# Patient Record
Sex: Female | Born: 1970
Health system: Southern US, Community
[De-identification: ages and names within clinical notes are randomized; demographics above are authoritative.]

## PROBLEM LIST (undated history)

## (undated) DIAGNOSIS — Z8619 Personal history of other infectious and parasitic diseases: Secondary | ICD-10-CM

## (undated) DIAGNOSIS — Z8719 Personal history of other diseases of the digestive system: Secondary | ICD-10-CM

## (undated) DIAGNOSIS — C679 Malignant neoplasm of bladder, unspecified: Secondary | ICD-10-CM

## (undated) DIAGNOSIS — M1990 Unspecified osteoarthritis, unspecified site: Secondary | ICD-10-CM

## (undated) DIAGNOSIS — C539 Malignant neoplasm of cervix uteri, unspecified: Secondary | ICD-10-CM

## (undated) DIAGNOSIS — K629 Disease of anus and rectum, unspecified: Secondary | ICD-10-CM

## (undated) DIAGNOSIS — Z8709 Personal history of other diseases of the respiratory system: Secondary | ICD-10-CM

## (undated) DIAGNOSIS — Z87898 Personal history of other specified conditions: Secondary | ICD-10-CM

## (undated) DIAGNOSIS — M5137 Other intervertebral disc degeneration, lumbosacral region: Secondary | ICD-10-CM

## (undated) DIAGNOSIS — T8859XA Other complications of anesthesia, initial encounter: Secondary | ICD-10-CM

## (undated) DIAGNOSIS — F4310 Post-traumatic stress disorder, unspecified: Secondary | ICD-10-CM

## (undated) DIAGNOSIS — F411 Generalized anxiety disorder: Secondary | ICD-10-CM

## (undated) DIAGNOSIS — G4733 Obstructive sleep apnea (adult) (pediatric): Secondary | ICD-10-CM

## (undated) DIAGNOSIS — Z8673 Personal history of transient ischemic attack (TIA), and cerebral infarction without residual deficits: Secondary | ICD-10-CM

## (undated) DIAGNOSIS — G629 Polyneuropathy, unspecified: Secondary | ICD-10-CM

## (undated) DIAGNOSIS — E119 Type 2 diabetes mellitus without complications: Secondary | ICD-10-CM

## (undated) DIAGNOSIS — G43909 Migraine, unspecified, not intractable, without status migrainosus: Secondary | ICD-10-CM

## (undated) DIAGNOSIS — Z8489 Family history of other specified conditions: Secondary | ICD-10-CM

## (undated) DIAGNOSIS — C801 Malignant (primary) neoplasm, unspecified: Secondary | ICD-10-CM

## (undated) DIAGNOSIS — E039 Hypothyroidism, unspecified: Secondary | ICD-10-CM

## (undated) DIAGNOSIS — M51379 Other intervertebral disc degeneration, lumbosacral region without mention of lumbar back pain or lower extremity pain: Secondary | ICD-10-CM

## (undated) DIAGNOSIS — R0600 Dyspnea, unspecified: Secondary | ICD-10-CM

## (undated) DIAGNOSIS — B009 Herpesviral infection, unspecified: Secondary | ICD-10-CM

## (undated) DIAGNOSIS — C210 Malignant neoplasm of anus, unspecified: Secondary | ICD-10-CM

## (undated) DIAGNOSIS — F329 Major depressive disorder, single episode, unspecified: Secondary | ICD-10-CM

## (undated) DIAGNOSIS — F419 Anxiety disorder, unspecified: Secondary | ICD-10-CM

## (undated) DIAGNOSIS — K219 Gastro-esophageal reflux disease without esophagitis: Secondary | ICD-10-CM

## (undated) DIAGNOSIS — R112 Nausea with vomiting, unspecified: Secondary | ICD-10-CM

## (undated) DIAGNOSIS — Z973 Presence of spectacles and contact lenses: Secondary | ICD-10-CM

## (undated) DIAGNOSIS — R0609 Other forms of dyspnea: Secondary | ICD-10-CM

## (undated) DIAGNOSIS — Z87410 Personal history of cervical dysplasia: Secondary | ICD-10-CM

## (undated) DIAGNOSIS — G8929 Other chronic pain: Secondary | ICD-10-CM

## (undated) DIAGNOSIS — G459 Transient cerebral ischemic attack, unspecified: Secondary | ICD-10-CM

## (undated) DIAGNOSIS — K649 Unspecified hemorrhoids: Secondary | ICD-10-CM

## (undated) DIAGNOSIS — J449 Chronic obstructive pulmonary disease, unspecified: Secondary | ICD-10-CM

## (undated) DIAGNOSIS — Z8701 Personal history of pneumonia (recurrent): Secondary | ICD-10-CM

## (undated) DIAGNOSIS — R7303 Prediabetes: Secondary | ICD-10-CM

## (undated) DIAGNOSIS — K620 Anal polyp: Secondary | ICD-10-CM

## (undated) DIAGNOSIS — Z8639 Personal history of other endocrine, nutritional and metabolic disease: Secondary | ICD-10-CM

## (undated) DIAGNOSIS — Z9989 Dependence on other enabling machines and devices: Secondary | ICD-10-CM

## (undated) HISTORY — DX: Post-traumatic stress disorder, unspecified: F43.10

## (undated) HISTORY — PX: OTHER SURGICAL HISTORY: SHX169

## (undated) HISTORY — DX: Chronic obstructive pulmonary disease, unspecified: J44.9

## (undated) HISTORY — PX: DILATION AND CURETTAGE OF UTERUS: SHX78

## (undated) HISTORY — PX: ABDOMINAL HYSTERECTOMY: SHX81

## (undated) HISTORY — PX: HYSTEROSCOPY WITH D & C: SHX1775

---
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
## 1998-05-18 HISTORY — PX: OTHER SURGICAL HISTORY: SHX169

## 2001-08-08 ENCOUNTER — Emergency Department (HOSPITAL_COMMUNITY): Admission: EM | Admit: 2001-08-08 | Discharge: 2001-08-08 | Payer: Self-pay | Admitting: Emergency Medicine

## 2001-08-08 ENCOUNTER — Encounter: Payer: Self-pay | Admitting: Emergency Medicine

## 2001-10-14 ENCOUNTER — Ambulatory Visit (HOSPITAL_COMMUNITY): Admission: RE | Admit: 2001-10-14 | Discharge: 2001-10-14 | Payer: Self-pay | Admitting: Family Medicine

## 2001-10-14 ENCOUNTER — Encounter: Payer: Self-pay | Admitting: Family Medicine

## 2001-11-02 ENCOUNTER — Encounter: Payer: Self-pay | Admitting: Thoracic Surgery

## 2001-11-04 ENCOUNTER — Encounter (INDEPENDENT_AMBULATORY_CARE_PROVIDER_SITE_OTHER): Payer: Self-pay | Admitting: *Deleted

## 2001-11-04 ENCOUNTER — Encounter: Payer: Self-pay | Admitting: Thoracic Surgery

## 2001-11-04 ENCOUNTER — Inpatient Hospital Stay (HOSPITAL_COMMUNITY): Admission: RE | Admit: 2001-11-04 | Discharge: 2001-11-09 | Payer: Self-pay | Admitting: Thoracic Surgery

## 2001-11-04 HISTORY — PX: VIDEO ASSISTED THORACOSCOPY (VATS)/THOROCOTOMY: SHX6173

## 2001-11-05 ENCOUNTER — Encounter: Payer: Self-pay | Admitting: Thoracic Surgery

## 2001-11-06 ENCOUNTER — Encounter: Payer: Self-pay | Admitting: Thoracic Surgery

## 2001-11-07 ENCOUNTER — Encounter: Payer: Self-pay | Admitting: Thoracic Surgery

## 2001-11-08 ENCOUNTER — Encounter: Payer: Self-pay | Admitting: Thoracic Surgery

## 2001-11-15 ENCOUNTER — Encounter: Payer: Self-pay | Admitting: Thoracic Surgery

## 2001-11-15 ENCOUNTER — Encounter: Admission: RE | Admit: 2001-11-15 | Discharge: 2001-11-15 | Payer: Self-pay | Admitting: Thoracic Surgery

## 2001-12-08 ENCOUNTER — Encounter: Admission: RE | Admit: 2001-12-08 | Discharge: 2001-12-08 | Payer: Self-pay | Admitting: Thoracic Surgery

## 2001-12-08 ENCOUNTER — Encounter: Payer: Self-pay | Admitting: Thoracic Surgery

## 2002-02-07 ENCOUNTER — Encounter: Admission: RE | Admit: 2002-02-07 | Discharge: 2002-02-07 | Payer: Self-pay | Admitting: Thoracic Surgery

## 2002-02-07 ENCOUNTER — Encounter: Payer: Self-pay | Admitting: Thoracic Surgery

## 2005-06-02 ENCOUNTER — Other Ambulatory Visit: Admission: RE | Admit: 2005-06-02 | Discharge: 2005-06-02 | Payer: Self-pay | Admitting: Obstetrics & Gynecology

## 2005-11-25 ENCOUNTER — Other Ambulatory Visit: Admission: RE | Admit: 2005-11-25 | Discharge: 2005-11-25 | Payer: Self-pay | Admitting: Obstetrics & Gynecology

## 2006-06-18 ENCOUNTER — Other Ambulatory Visit: Admission: RE | Admit: 2006-06-18 | Discharge: 2006-06-18 | Payer: Self-pay | Admitting: Obstetrics & Gynecology

## 2006-08-03 ENCOUNTER — Emergency Department (HOSPITAL_COMMUNITY): Admission: EM | Admit: 2006-08-03 | Discharge: 2006-08-04 | Payer: Self-pay | Admitting: Emergency Medicine

## 2007-02-02 ENCOUNTER — Other Ambulatory Visit: Admission: RE | Admit: 2007-02-02 | Discharge: 2007-02-02 | Payer: Self-pay | Admitting: Obstetrics & Gynecology

## 2007-05-27 ENCOUNTER — Emergency Department (HOSPITAL_COMMUNITY): Admission: EM | Admit: 2007-05-27 | Discharge: 2007-05-27 | Payer: Self-pay | Admitting: Family Medicine

## 2007-06-13 ENCOUNTER — Ambulatory Visit (HOSPITAL_COMMUNITY): Admission: RE | Admit: 2007-06-13 | Discharge: 2007-06-13 | Payer: Self-pay | Admitting: Family Medicine

## 2007-06-13 IMAGING — CT CT HEAD W/O CM
1 series · 16 of 30 positions shown, 20 images · IV contrast (agent unspecified)
Comparison: none

CLINICAL DATA: Sudden onset of headaches. 
HEAD CT WITHOUT CONTRAST:
TECHNIQUE: Contiguous axial images were obtained from the base of the skull through the vertex according to standard protocol without contrast.

[Series 2: head routine 4.8 h47s · axial · 0.39mm/px · z∈[-136,-5]mm · 16 of 30 slices shown, 20 images]
[im 2/30  brain]
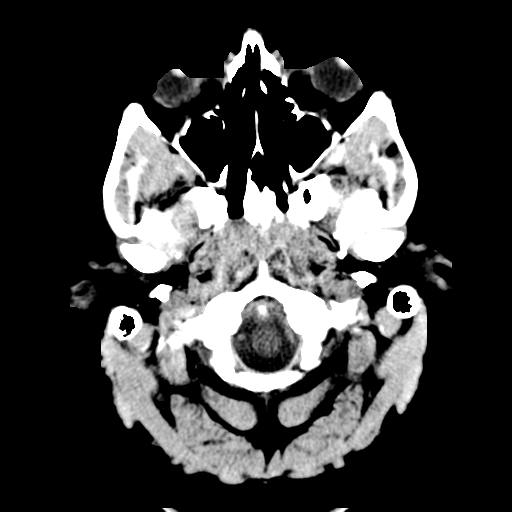
[im 2/30  bone]
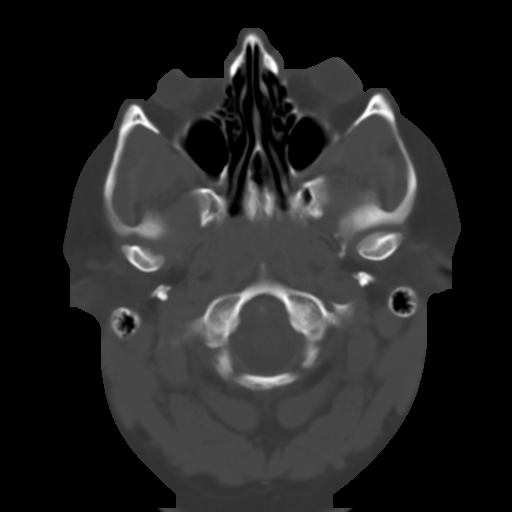
[im 4/30  brain]
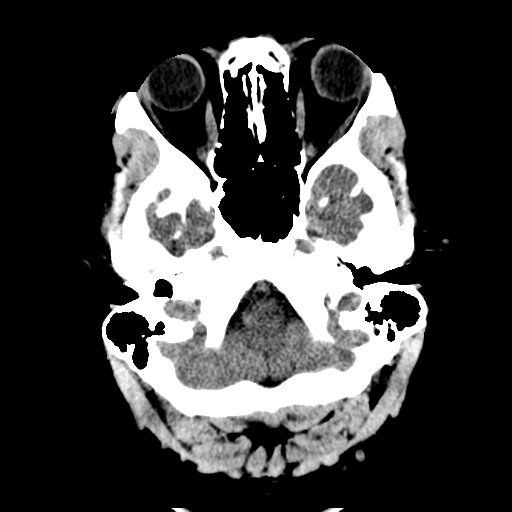
[im 6/30  brain]
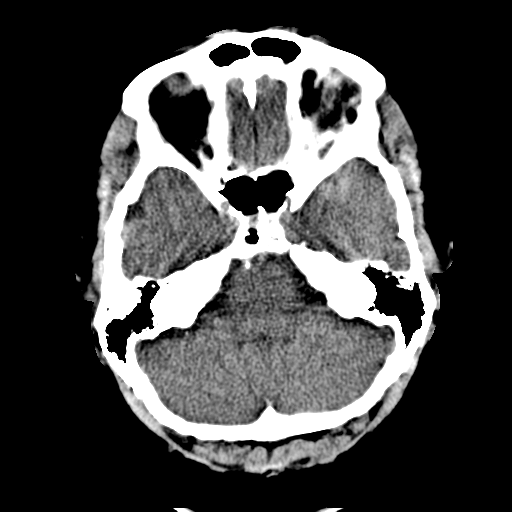
[im 8/30  brain]
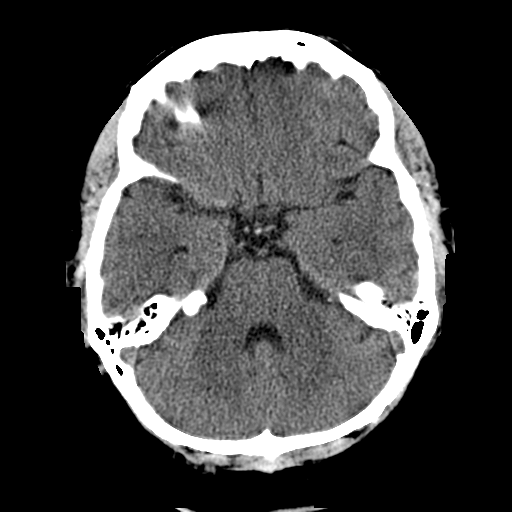
[im 9/30  brain]
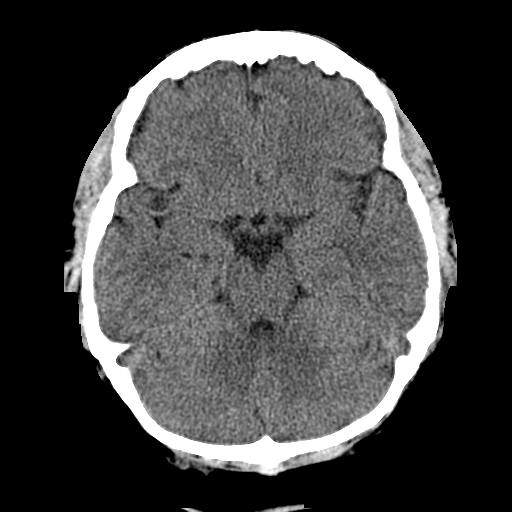
[im 9/30  bone]
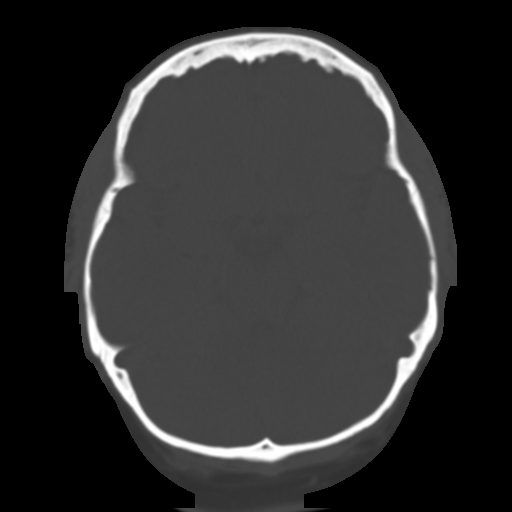
[im 11/30  brain]
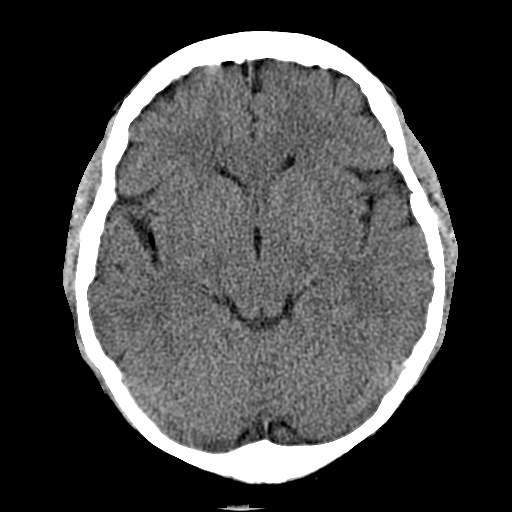
[im 13/30  brain]
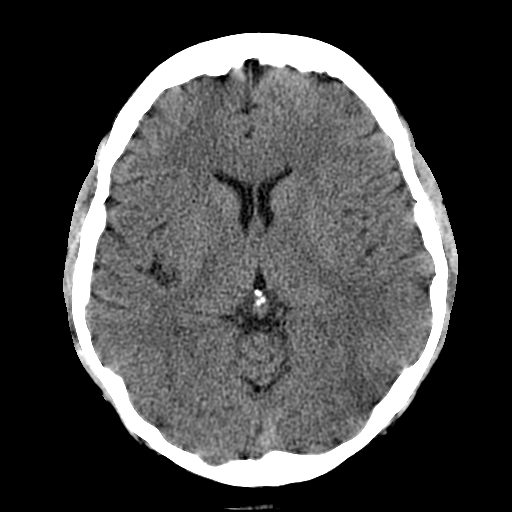
[im 15/30  brain]
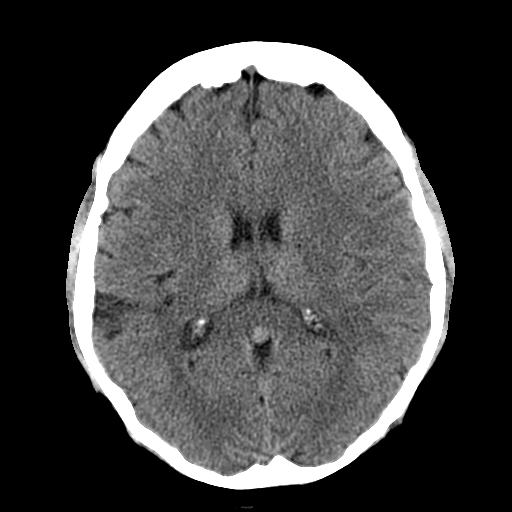
[im 16/30  brain]
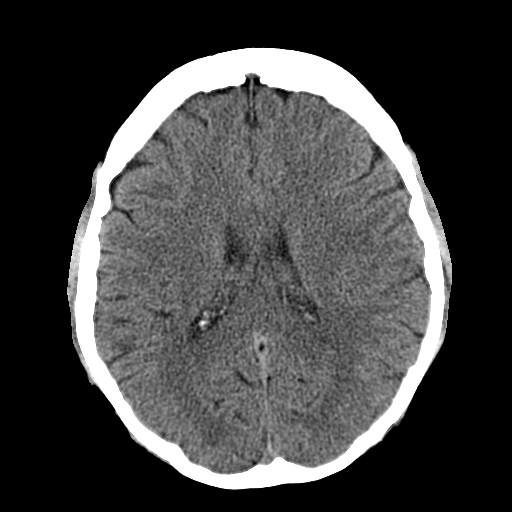
[im 16/30  bone]
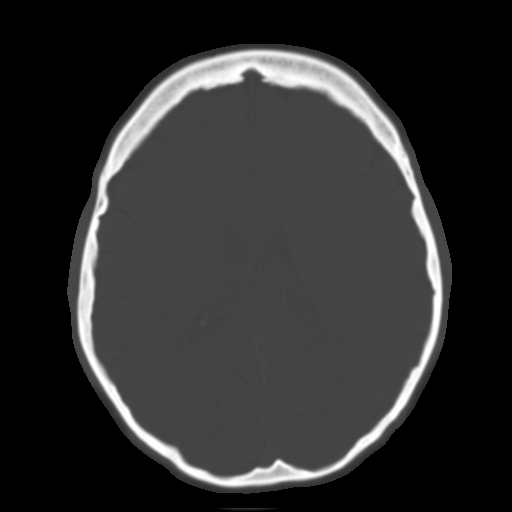
[im 18/30  brain]
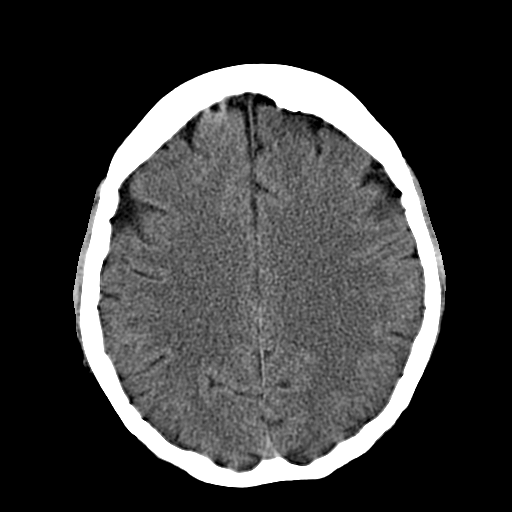
[im 20/30  brain]
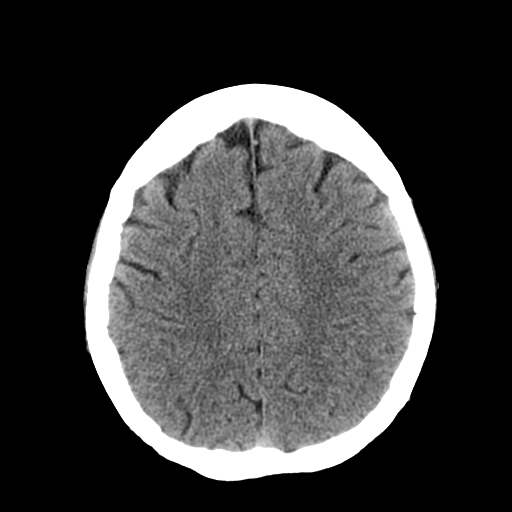
[im 22/30  brain]
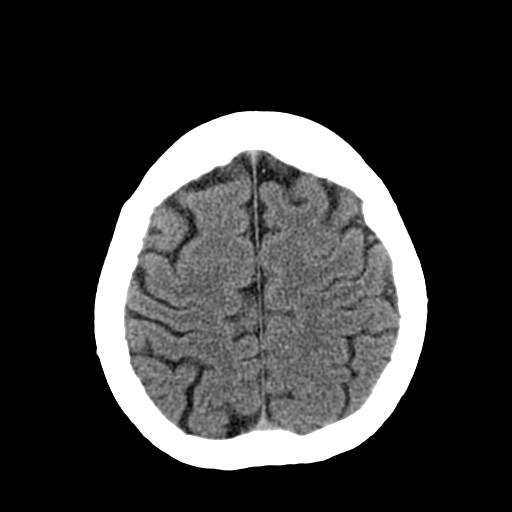
[im 23/30  brain]
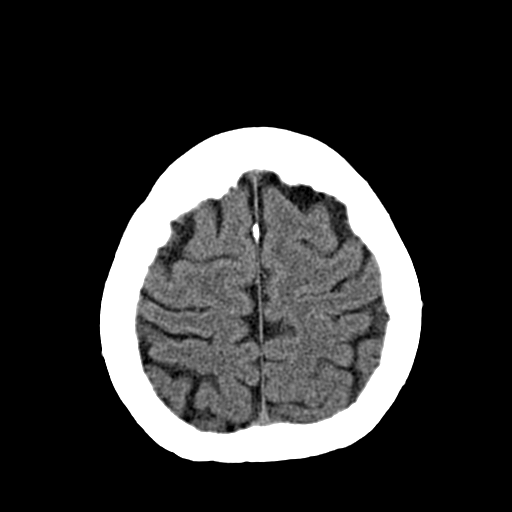
[im 23/30  bone]
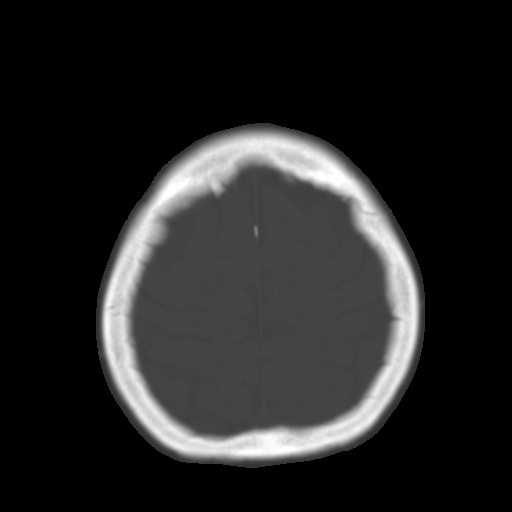
[im 25/30  brain]
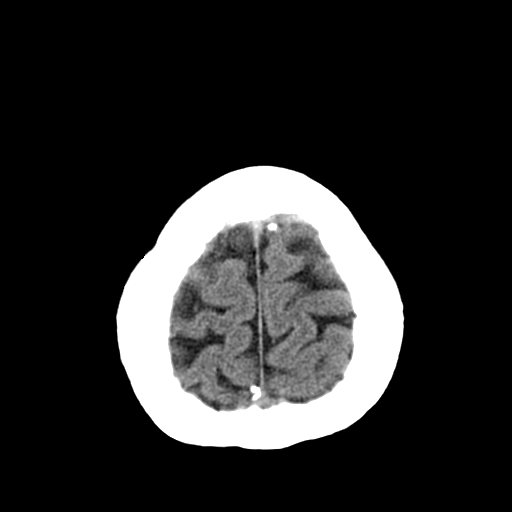
[im 27/30  brain]
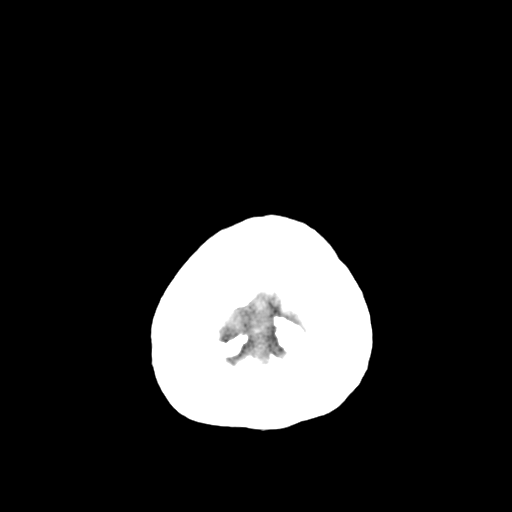
[im 29/30  brain]
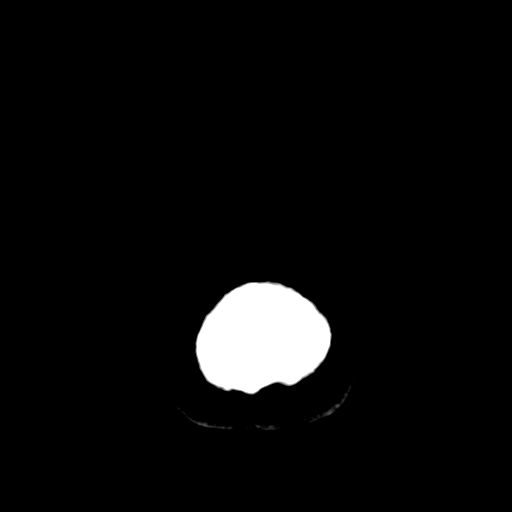

[16 of 30 positions shown; findings below may reference images not displayed]

FINDINGS: There is no evidence of intracranial hemorrhage, brain edema, acute infarct, mass lesion, or mass effect.  No other intra-axial abnormalities are seen, and the ventricles are within normal limits.  No abnormal extra-axial fluid collections or masses are identified.  No skull abnormalities are noted.
IMPRESSION: Negative non-contrast head CT.

## 2007-09-12 ENCOUNTER — Encounter: Payer: Self-pay | Admitting: Obstetrics & Gynecology

## 2007-09-12 ENCOUNTER — Ambulatory Visit (HOSPITAL_BASED_OUTPATIENT_CLINIC_OR_DEPARTMENT_OTHER): Admission: RE | Admit: 2007-09-12 | Discharge: 2007-09-12 | Payer: Self-pay | Admitting: Obstetrics & Gynecology

## 2008-01-26 ENCOUNTER — Other Ambulatory Visit: Admission: RE | Admit: 2008-01-26 | Discharge: 2008-01-26 | Payer: Self-pay | Admitting: Obstetrics & Gynecology

## 2009-05-04 ENCOUNTER — Emergency Department (HOSPITAL_COMMUNITY): Admission: EM | Admit: 2009-05-04 | Discharge: 2009-05-05 | Payer: Self-pay | Admitting: Emergency Medicine

## 2009-05-05 IMAGING — CT CT HEAD W/O CM
1 series · 16 of 30 positions shown, 20 images · non-contrast
Comparison: [DATE]

CLINICAL DATA: Dizziness, vomiting, head injury.  Fall.

CT HEAD WITHOUT CONTRAST
TECHNIQUE: Contiguous axial images were obtained from the base of
the skull through the vertex without contrast.

[Series 2: headseq 4.8 h45s · axial · 0.39mm/px · z∈[+1242,+1372]mm · 16 of 30 slices shown, 20 images]
[im 2/30  brain]
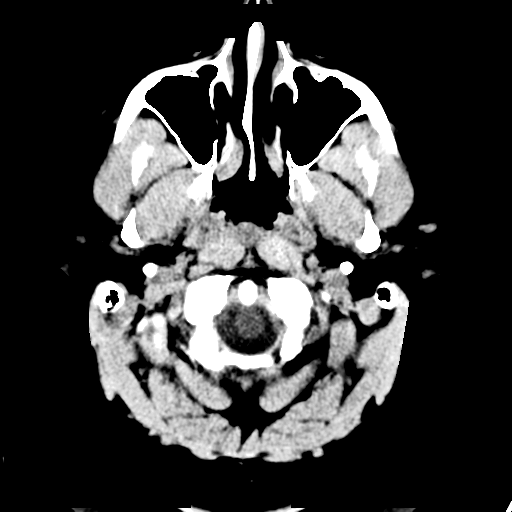
[im 2/30  bone]
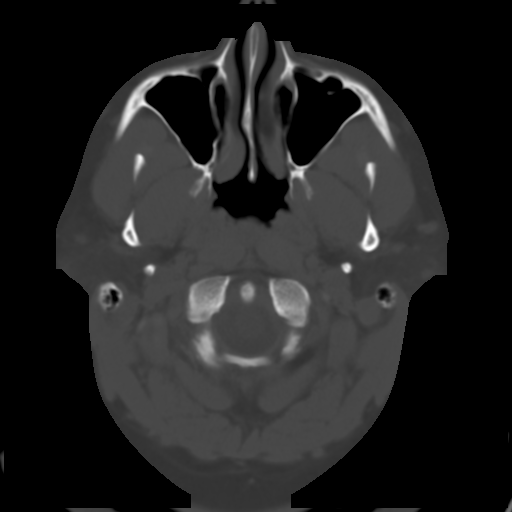
[im 4/30  brain]
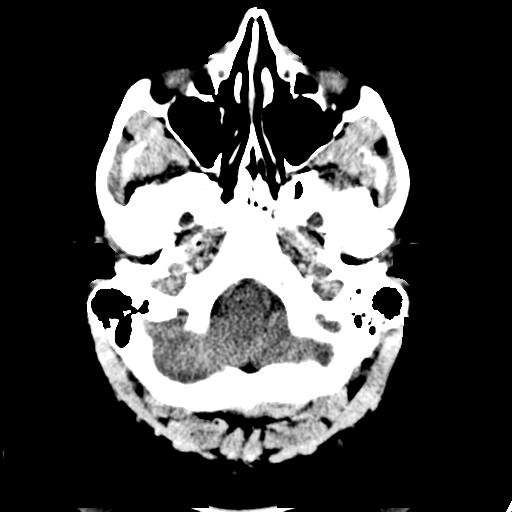
[im 6/30  brain]
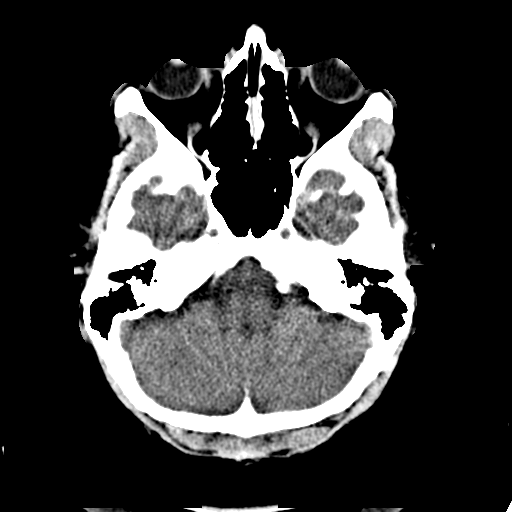
[im 8/30  brain]
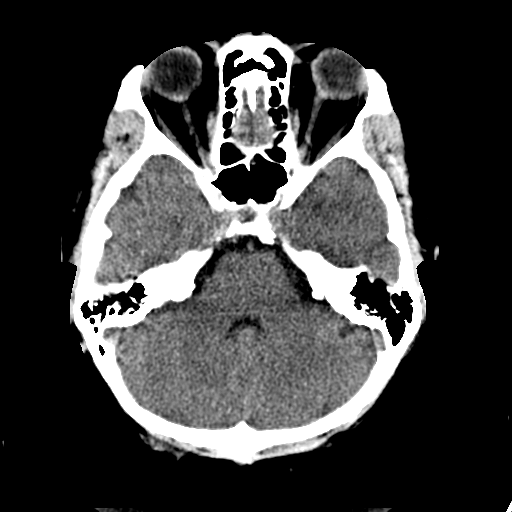
[im 9/30  brain]
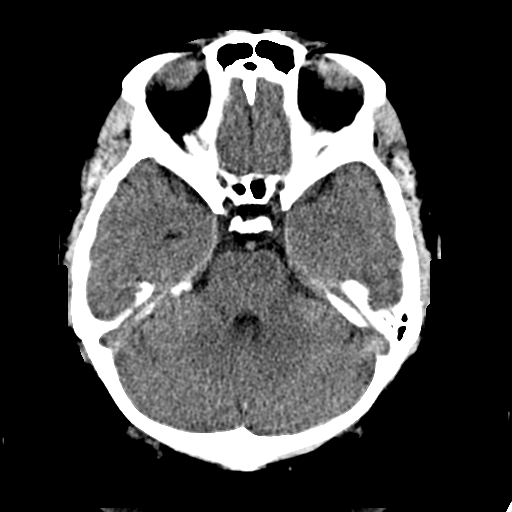
[im 9/30  bone]
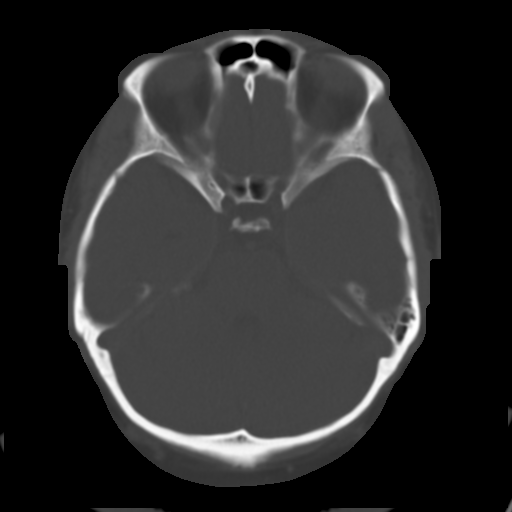
[im 11/30  brain]
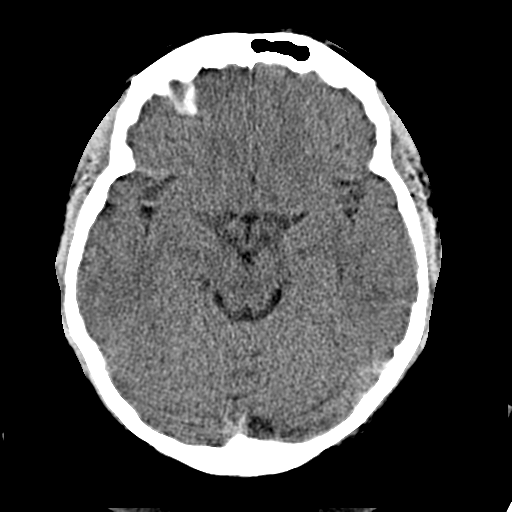
[im 13/30  brain]
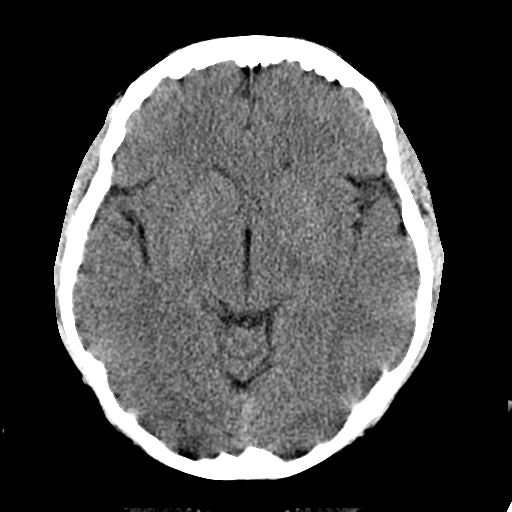
[im 15/30  brain]
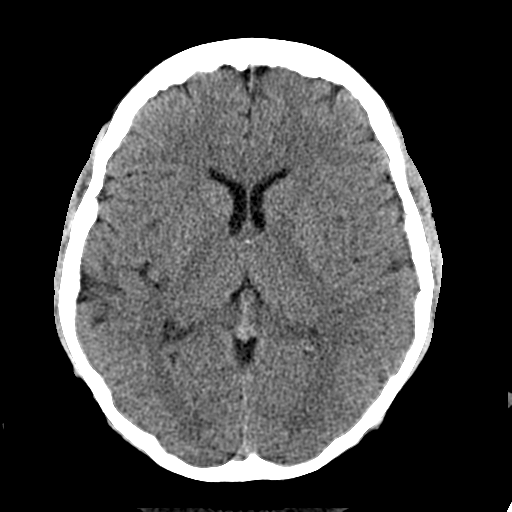
[im 16/30  brain]
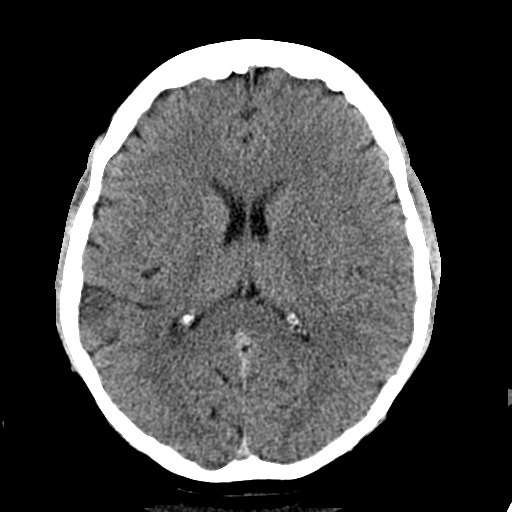
[im 16/30  bone]
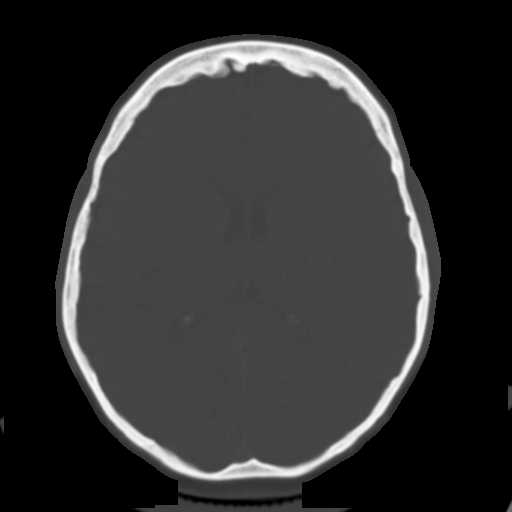
[im 18/30  brain]
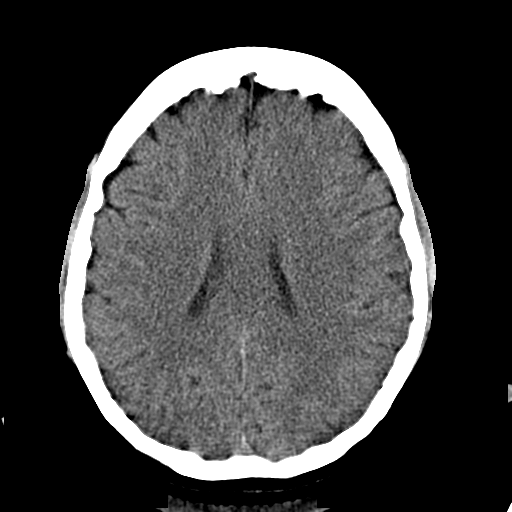
[im 20/30  brain]
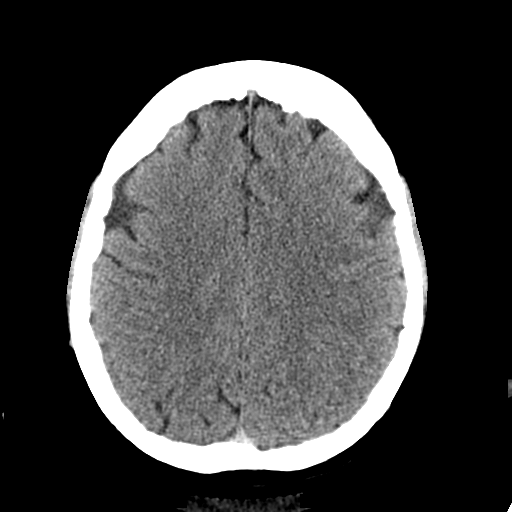
[im 22/30  brain]
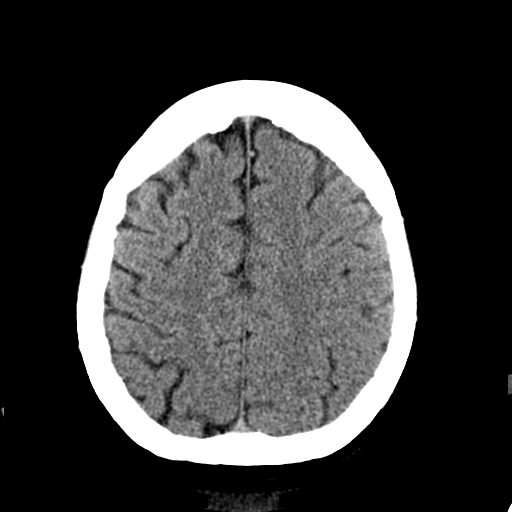
[im 23/30  brain]
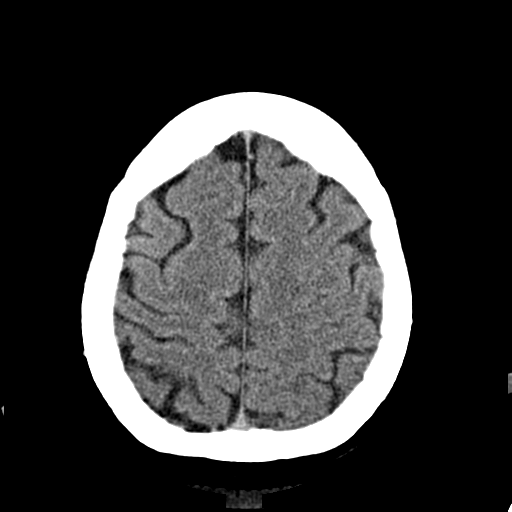
[im 23/30  bone]
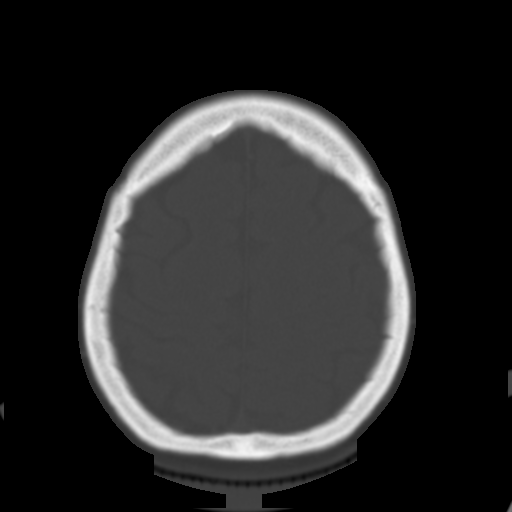
[im 25/30  brain]
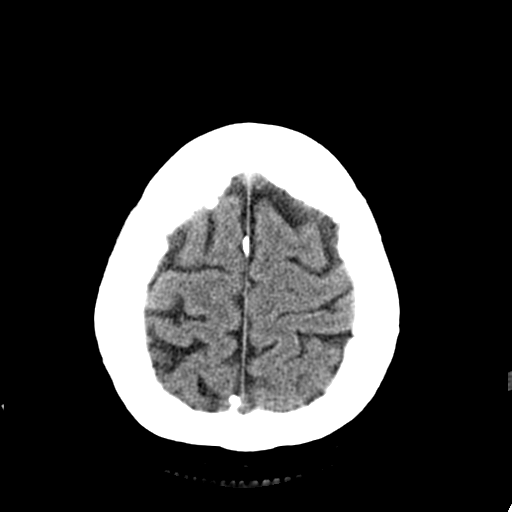
[im 27/30  brain]
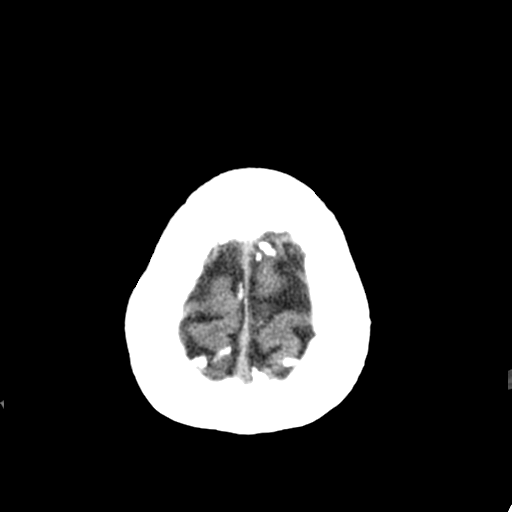
[im 29/30  brain]
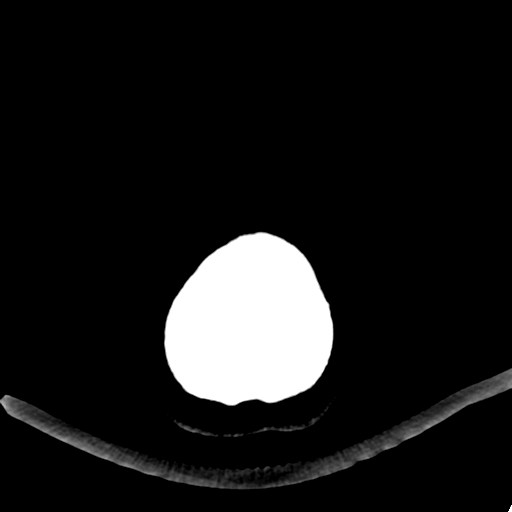

[16 of 30 positions shown; findings below may reference images not displayed]

FINDINGS: No acute intracranial abnormality.  Specifically, no
hemorrhage, hydrocephalus, mass lesion, acute infarction, or
significant intracranial injury.  No acute calvarial abnormality.
Visualized paranasal sinuses and mastoids clear.  Orbital soft
tissues unremarkable.
IMPRESSION: No acute intracranial abnormality.

## 2010-06-08 ENCOUNTER — Encounter: Payer: Self-pay | Admitting: Family Medicine

## 2010-09-04 ENCOUNTER — Emergency Department (HOSPITAL_COMMUNITY)
Admission: EM | Admit: 2010-09-04 | Discharge: 2010-09-05 | Disposition: A | Discharge status: Home / Self Care | Payer: 59 | Attending: Emergency Medicine | Admitting: Emergency Medicine

## 2010-09-04 ENCOUNTER — Ambulatory Visit (INDEPENDENT_AMBULATORY_CARE_PROVIDER_SITE_OTHER): Payer: 59

## 2010-09-04 ENCOUNTER — Inpatient Hospital Stay (INDEPENDENT_AMBULATORY_CARE_PROVIDER_SITE_OTHER)
Admission: RE | Admit: 2010-09-04 | Discharge: 2010-09-04 | Disposition: A | Discharge status: Home / Self Care | Payer: 59 | Source: Ambulatory Visit | Attending: Family Medicine | Admitting: Family Medicine

## 2010-09-04 ENCOUNTER — Emergency Department (HOSPITAL_COMMUNITY): Payer: 59

## 2010-09-04 DIAGNOSIS — J45909 Unspecified asthma, uncomplicated: Secondary | ICD-10-CM

## 2010-09-04 DIAGNOSIS — Z8541 Personal history of malignant neoplasm of cervix uteri: Secondary | ICD-10-CM | POA: Insufficient documentation

## 2010-09-04 DIAGNOSIS — R0609 Other forms of dyspnea: Secondary | ICD-10-CM | POA: Insufficient documentation

## 2010-09-04 DIAGNOSIS — J45901 Unspecified asthma with (acute) exacerbation: Secondary | ICD-10-CM | POA: Insufficient documentation

## 2010-09-04 DIAGNOSIS — R0989 Other specified symptoms and signs involving the circulatory and respiratory systems: Secondary | ICD-10-CM | POA: Insufficient documentation

## 2010-09-04 LAB — CBC
MCH: 29.9 pg (ref 26.0–34.0)
MCV: 85.3 fL (ref 78.0–100.0)
Platelets: 321 10*3/uL (ref 150–400)
RDW: 13.3 % (ref 11.5–15.5)
WBC: 11.2 10*3/uL — ABNORMAL HIGH (ref 4.0–10.5)

## 2010-09-04 LAB — DIFFERENTIAL
Basophils Relative: 0 % (ref 0–1)
Eosinophils Absolute: 0.2 10*3/uL (ref 0.0–0.7)
Eosinophils Relative: 2 % (ref 0–5)
Lymphs Abs: 3.3 10*3/uL (ref 0.7–4.0)

## 2010-09-04 IMAGING — CR DG CHEST 2V
2 series · 2 of 2 positions shown · non-contrast
Comparison: None.

CLINICAL DATA: Shortness of breath.  Audible wheezing.

CHEST - 2 VIEW [DATE]:

[view not recorded (1 of 2)]
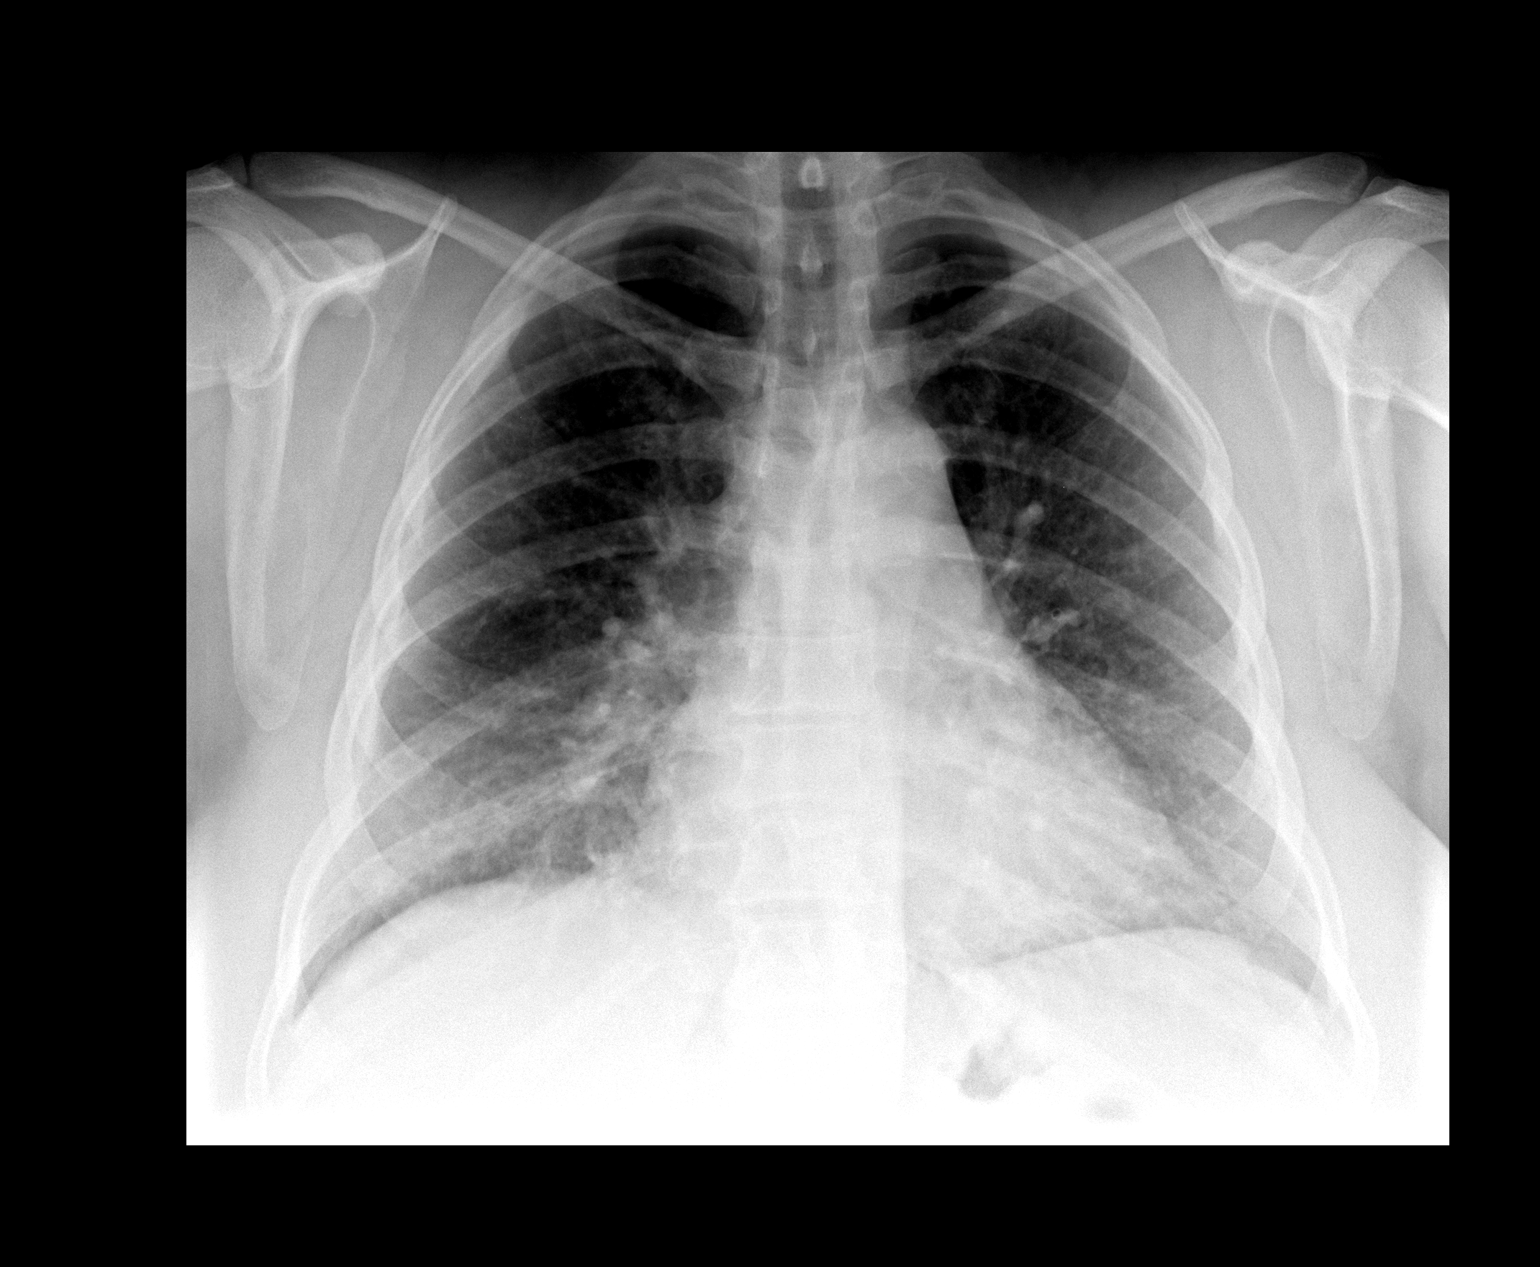

[view not recorded (2 of 2)]
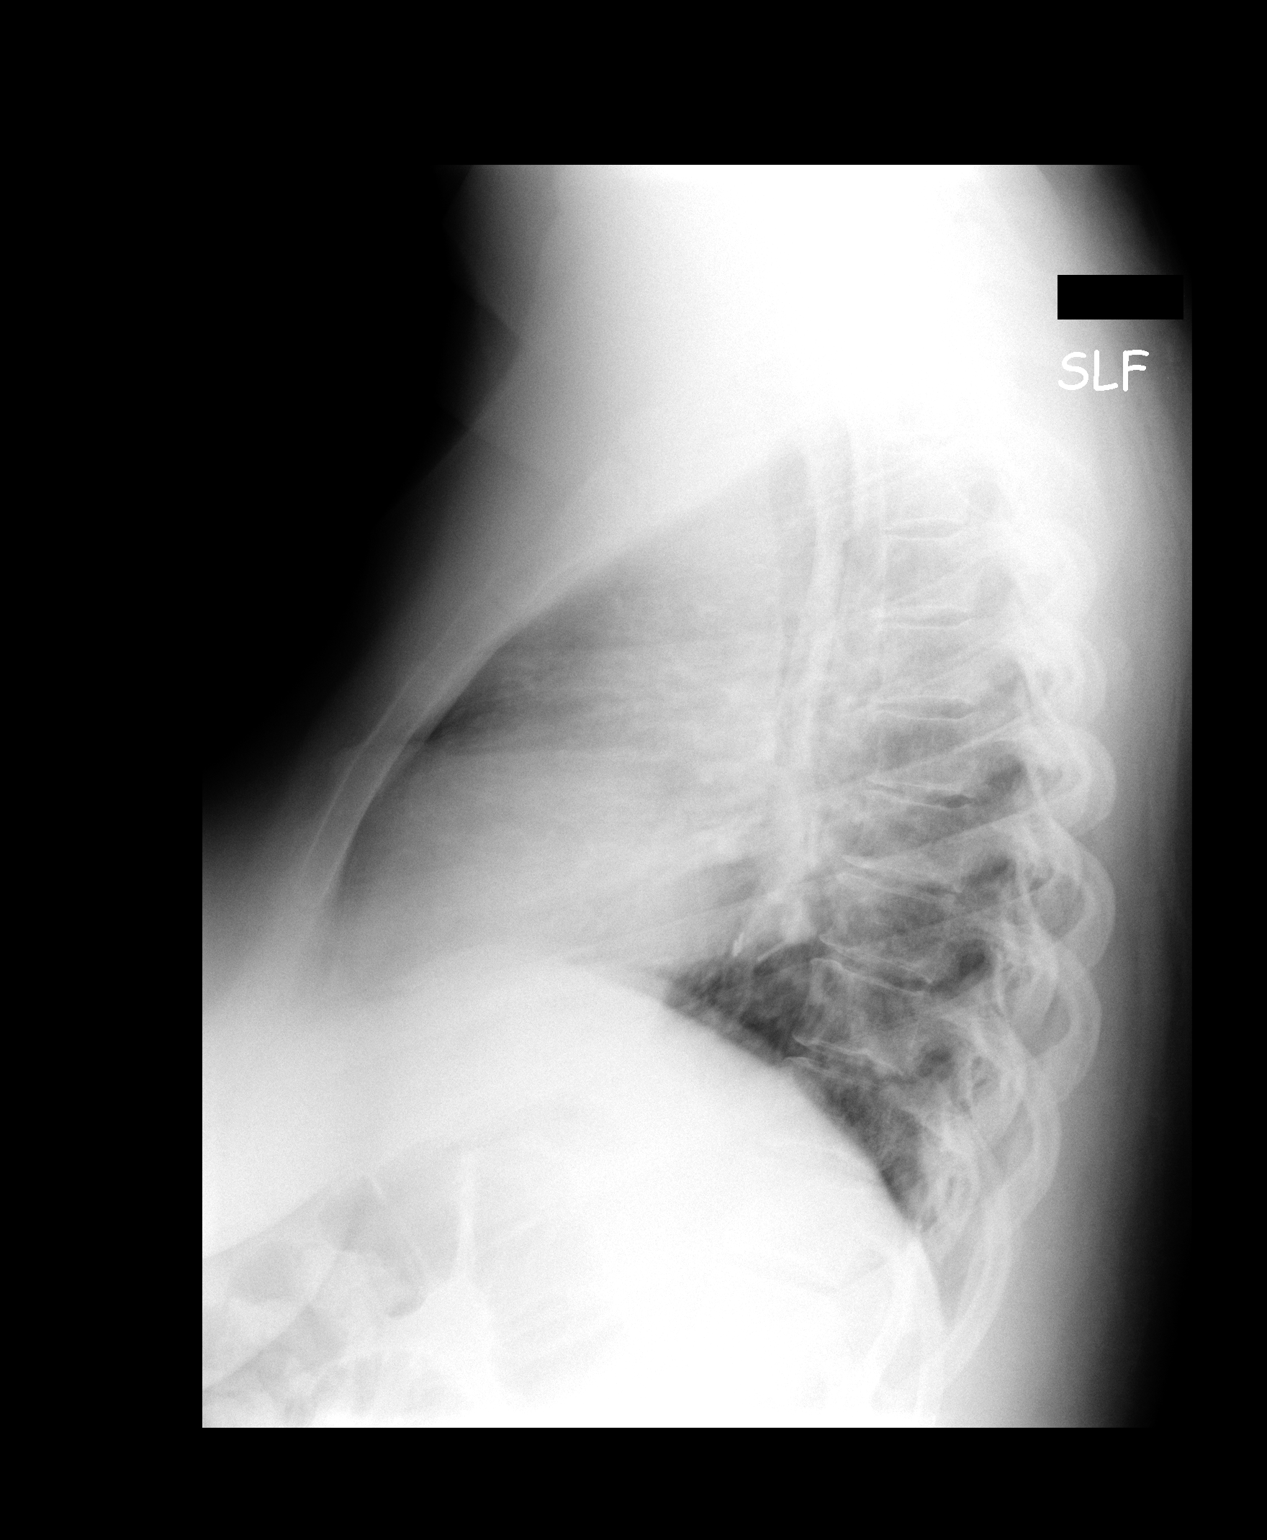

[2 of 2 positions shown; findings below may reference images not displayed]

FINDINGS: Cardiac silhouette upper normal in size to slightly
enlarged.  Hilar and mediastinal contours otherwise unremarkable.
Markedly prominent bronchovascular markings diffusely and moderate
central peribronchial thickening.  No confluent airspace
consolidation.  No pleural effusions.  Mild degenerative changes
involving the thoracic spine.
IMPRESSION: Borderline heart size.  Moderate to severe changes of bronchitis
and/or asthma without localized airspace pneumonia.

## 2010-09-05 LAB — BASIC METABOLIC PANEL
BUN: 5 mg/dL — ABNORMAL LOW (ref 6–23)
Creatinine, Ser: 0.75 mg/dL (ref 0.4–1.2)
GFR calc non Af Amer: >60 mL/min (ref >60)
Potassium: 3.1 mEq/L — ABNORMAL LOW (ref 3.5–5.1)

## 2010-09-30 NOTE — Op Note (Signed)
Denise Ray, SCARPATI               ACCOUNT NO.:  [id number]   MEDICAL RECORD NO.:  [id number]          PATIENT TYPE:  AMB   LOCATION:  [facility]                         FACILITY:  [facility]   PHYSICIAN:  M. Leda Quail, MD  DATE OF BIRTH:  1971/01/17   DATE OF PROCEDURE:  09/12/2007  DATE OF DISCHARGE:                               OPERATIVE REPORT   PREOPERATIVE DIAGNOSES:  79. A 40 year old gravida 1, para 1 single white female with      dysfunctional uterine bleeding.  2. History of Mirena intrauterine device placed September  3. Endometrial biopsy February 2008 showing a polyp with proliferative      endometrium.  4. Elevated triglycerides.  5. Hypothyroidism  6. Obesity.   POSTOPERATIVE DIAGNOSES:  24. A 40 year old gravida 1, para 1 single white female with      dysfunctional uterine bleeding.  2. History of Mirena intrauterine device placed September 2008  3. Endometrial biopsy February 2008 showing a polyp with proliferative      endometrium.  4. Elevated triglycerides.  5. Hypothyroidism  6. Obesity.   PROCEDURE:  IUD removal, hysteroscopy and D&C.   SURGEON:  M. Leda Quail, M.D.   ASSISTANT:  OR staff.   ANESTHESIA:  MAC.   FINDINGS:  Fluffy endometrium.  No clear-cut polyp was noted.   SPECIMENS:  Endometrial curettings sent to pathology.   ESTIMATED BLOOD LOSS:  Minimal.   FLUIDS:  500 mL of LR.   FLUID DEFICIT:  45 mL of 3% sorbitol.   COMPLICATIONS:  None.   INDICATIONS:  Ms. Denise Ray is a 41 year old G 1, P 1 white female with  dysfunctional uterine bleeding.  She did have a Mirena put in place  September 2008.  She has a history of anovulation and we decided that  this was best treated with Mirena to help control the irregular cycles  to hopefully improve her bleeding when she did have cycles.  She has  difficulty tolerating estrogen and cannot be on estrogen oral  contraceptives.  The patient called in January and reported that she had  had continued  bleeding from the time of the IUD placement.  This was the  first she notified me of this.  She came in for a pelvic ultrasound  which showed a thickened endometrium and an endometrial biopsy was  performed.  This showed proliferative endometrium and polyp.  Because of  this, a hysteroscopy with D&C was scheduled.  She is here for this  today.  Risks and benefits have been explained to the patient.   DESCRIPTION OF PROCEDURE:  The patient was taken to the operating room.  She was placed in the supine position.  Anesthesia was by the anesthesia  staff without difficulty.  Legs were positioned in the dorsal lithotomy  position in the [place] stirrups.  Patient was prepped with Betadine  solution.  A red rubber Foley catheter is used to drain the bladder of  all urine.  Patient was draped in a normal sterile fashion.  Bivalve  speculum is placed in the vagina.  The anterior lip of the cervix is  grasped with single-tooth tenaculum.  The Mirena IUD strings were well  visualized and grasped with a ringed forceps.  The IUD was easily  removed with one pull of the strings.  IUD visualized outside the  patient by both OR nurses and discarded.  The uterus sounds to 9 cm.  The cervix is dilated with Pratt dilators up to a #21.  Then a 3.9 mm  diagnostic hysteroscope was used to visualize the endometrial cavity.  There was just thicken and fluffy tissue noted.  The tubal ostia was  noted.  Photo documentation was made.  Then a #1 rough curette was  obtained.  The endometrial cavity was curetted until a rough gritty  texture was noted in all quadrants.  A #1 smooth curette was obtained.  The same procedure was performed.  The endometrial cavity was  revisualized with the hysteroscope and the fluffy endometrium was  absent.  The endometrium at this point looked like cavity that had just  been curetted.  The hysteroscope was removed and another #1 rough  curette was used to recurette the endometrial  cavity.  Minimal tissue  was obtained with this pass of the curette.  At this point the procedure  was ended.  The tenaculum was removed from the cervix.  Silver nitrate  was used to make one tenaculum site hemostasis.  All instruments were  removed from the vagina.  The Betadine was cleansed from the skin.  The  patient was positioned back in the supine position.  Sponge, lap and  instrument counts were correct x2.  There were no needles used on the  field.  She is awakened from her anesthesia and extubated in stable  condition.      Lum Keas, MD  Electronically Signed     MSM/MEDQ  D:  09/12/2007  T:  09/12/2007  Job:  [id number]

## 2010-10-03 NOTE — Discharge Summary (Signed)
[place]. [facility]  Patient:    Denise Ray, Denise Ray Visit Number: [id number] MRN: [id number]          Service Type: SUR Location: [id number] Attending Physician:  Cameron Proud Dictated by:   Dominica Severin, P.A. Admit Date:  11/04/2001 Discharge Date: 11/09/2001   CC:         CVTS Office   Discharge Summary  DATE OF BIRTH:  1970/11/27  PRIMARY ADMISSION DIAGNOSIS:  Esophageal cyst.  SECONDARY DIAGNOSIS/PAST MEDICAL HISTORY: 1. Stage I cervical cancer. 2. History of cystocele. 3. Heart murmur during pregnancy. 4. History of asthma. 5. Dysfunctional uterine bleeding.  NEW DIAGNOSES/DISCHARGE DIAGNOSES: 1. Stage I cervical cancer. 2. History of cystocele. 3. Heart murmur during pregnancy. 4. History of asthma. 5. Dysfunctional uterine bleeding.  PROCEDURES PERFORMED: 1. Right VATS, right main thoracotomy and resection of esophageal cyst and    culture of cyst contents done on November 04, 2001. 2. Gastrografin and barium swallow done on November 07, 2001.  HOSPITAL COURSE:  This patient is a 40 year old Caucasian female who was referred by Dr. Reola Calkins for evaluation of right lung mass which was found to have a lung mass approximately 18 months ago at [facility]. It was recommended that she follow with a specialist but did not have money at that time. She developed pneumonia. She was seen in [facility] about 14 months ago and was told the mass had increased in size. A CT scan revealed that it was 8 x 5 cm. This posterior mediastinal mass had appeared cystic. She was recommended to proceed with a right VATS for final diagnosis.  She underwent the procedure as stated above on November 04, 2001. She tolerated the procedure well. Her postoperative course was essentially uneventful except for some pain management issues. She was found to have slight atelectasis on her chest x-ray. A followup swallow study performed on November 07, 2001,  was within normal limits. There was no evidence of a leak, and her diet was advanced. She continued to progress. She remained stable from a hemodynamic and pulmonary standpoint, tolerating room air oxygen saturations greater than 90%. Her incisions remained clean and dry without any signs of infection. She was making good urine without any kidney dysfunction. It was anticipated that she be discharged on November 09, 2001.  CONDITION ON DISCHARGE:  Stable.  DISCHARGE MEDICATIONS: 1. Birth control pills at home. 2. Albuterol metered dose inhaler p.r.n. 3. Tylox 1 to 2 tablets q.4h. p.r.n. pain.  DISCHARGE INSTRUCTIONS:  The patient was instructed not to do any driving, heavy lifting or strenuous activity. She is to walk daily and to continue her breathing exercises. She is to follow a heart healthy diet. She was told she can shower. She is to notify the office for any increased temperatures greater than 101 degrees Fahrenheit or if there is any increased redness, swelling or drainage from her incision.  FOLLOWUP:  She is to see Dr. Edwyna Shell on Tuesday, November 15, 2001, at 3:30 p.m. after getting a chest x-ray at the [facility] at 2:30 p.m. Dictated by:   Dominica Severin, P.A. Attending Physician:  Cameron Proud DD:  11/08/01 TD:  11/09/01 Job: [id number]

## 2010-10-03 NOTE — Op Note (Signed)
[place]. [facility]  Patient:    Denise Ray, Denise Ray Visit Number: [id number] MRN: [id number]          Service Type: SUR Location: 3300 [id number] Attending Physician:  Cameron Proud Dictated by:   D. Karle Plumber, M.D. Proc. Date: 11/04/01 Admit Date:  11/04/2001                             Operative Report  PREOPERATIVE DIAGNOSIS:  Esophageal or bronchogenic cyst, right posterior mediastinum.  POSTOPERATIVE DIAGNOSIS:  Esophageal or bronchogenic cyst, right posterior mediastinum.  OPERATION PERFORMED:  Right video-assisted thoracoscopic surgery, mini-thoracotomy, resection of duplication esophageal cyst.  SURGEON:  D. Karle Plumber, M.D.  ANESTHESIA:  General.  DESCRIPTION OF PROCEDURE:  After the percutaneous insertion of all monitoring lines, the patient underwent general anesthesia.  She was prepped and draped in the usual sterile manner.  She was turned to the right lateral thoracotomy position.  A dual lumen tube was inserted.  Two trocar sites were made in the anterior and posterior axillary line at the 7th intercostal space.  Two trocars were inserted.  The 30-degree scope was inserted, and it was hard to visualize the cyst because it was so posteriorly.  The lung could not be quite retracted enough anteriorly, so a third incision was made at the 6th intercostal space at the posterior axillary line approximately 3.0 to 4.0 cm. Dissection was carried down through the subcutaneous tissues.  The latissimus was partially divided.  The serratus was flipped anteriorly, and the 6th intercostal space was entered.  This allowed it to come right down onto the cyst.  The lung was reflected superiorly with the pictures taken of the cyst. The cyst was in the subcarinal space between the esophagus and the bronchus. The inferior pulmonary ligament was taken down with electrocautery, and then dissection was made, dissecting out the cyst.  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  You could dissect the lung off of the cyst, even though it was a large cyst.  This was dissected up superiorly, up into the subcarinal area.  All bleeding was electrocoagulated.  The cyst was elevated up and dissected off of the lung. It was a large 4.0 cm x 6.0 cm or 7.0 cm cyst.  It was opened and yellow tenacious material was evacuated and sent for culture.  The cyst was then dissected down to where there was esophagus, and it seemed to be coming off between the muscle.  The base of the cyst was then stapled with a #18B35, and then the muscle of the esophagus was closed over the base with interrupted #3-0 Vicryl sutures.  The mucosa did not appear to be entered, and was irrigated clear.  It was irrigated copiously.  Two chest tubes were brought into the trocar sites and tied in place with #0 silk.  The third incision was closed with interrupted #2-0 Vicryl in the subcutaneous tissue.  An intracostal Marcaine block was done in the usual fashion.  Then the On-Q pump was placed using the catheters placed bringing in laterally into the wound, and the catheters were secured underneath the paracostals in order so they could soak the intracostal nerves with Marcaine.  After this had been done, the latissimus was closed with interrupted #1 Vicryl, the subcutaneous tissue with #2-0 Vicryl, and the subcuticular stitch with #3-0 Vicryl.  The  patient was returned to the recovery room in stable condition. Dictated by:   D. Karle Plumber, M.D. Attending Physician:  Cameron Proud DD:  11/04/01 TD:  11/05/01 Job: [id number]

## 2010-10-03 NOTE — H&P (Signed)
[place]. [facility]  Patient:    Denise Ray, Denise Ray Visit Number: [id number] MRN: [id number]          Service Type: SUR Location: 5700 [id number] Attending Physician:  Cameron Proud Dictated by:   Tollie Pizza Collins, P.A.-C. Admit Date:  11/04/2001   CC:         Austin Miles, M.D., [place], [place]   History and Physical  DATE OF BIRTH:  Aug 14, 1970  CHIEF COMPLAINT:  Right lung mass.  HISTORY OF PRESENT ILLNESS:  The patient is a 40 year old white female who is referred by Dr. Austin Miles of [facility] for evaluation of a right lung mass. Approximately 18 months ago, she developed some chest pain and shortness of breath, for which she was evaluated in the ER at [facility].  A chest x-ray was performed, which showed a right lung mass.  It was recommended that she undergo complete evaluation by a specialist; however, she felt that at the time she did not have adequate finances to proceed with this workup. Approximately four months later, she developed pneumonia and was seen at [facility] for treatment.  A chest x-ray performed at that time showed enlargement of the mass.  She was seen by Dr. Reola Calkins and underwent a CT scan, which revealed a 3 x 5 cm posterior mediastinal mass, which appears cystic. She was referred to Dr. Edwyna Shell for further evaluation.  Pulmonary function studies were performed, which revealed an FVC of 2.28 with an FEV1 of 1.83. She continues to have symptoms of cough and increasing shortness of breath, particularly on exertion.  She states that for many years she has required multiple pillows in order to breathe comfortably at night.  She denies hemoptysis or unexplained weight loss.  She also has had no paroxysmal nocturnal dyspnea, fevers, or chills.  It was recommended that she proceed with a right VATS with resection of the lesion.  PAST MEDICAL HISTORY: 1. Stage II cervical cancer diagnosed in 2000. 2. A cystocele. 3.  History of a heart murmur during pregnancy. 4. History of asthma. 5. Dysfunctional uterine bleeding.  PAST SURGICAL HISTORY: 1. Cervical conization in December 2000. 2. C-section in August 2001.  CURRENT MEDICATIONS: 1. Ortho Tri-Cyclen 1 tablet q.d. 2. Albuterol metered dose inhaler p.r.n.  ALLERGIES:  No known drug allergies.  FAMILY HISTORY:  Her mother has uterine cancer, as well as a lung mass which is currently being evaluated for possible carcinoma.  She is also hypertensive and has end-stage COPD/emphysema.  Her father is alive and has had a history of coronary artery disease with two myocardial infarctions in the past.  She has one brother who is living and has a history of breast cancer.  Her paternal grandmother died at age 44 with coronary artery disease and diabetes mellitus.  Her maternal grandmother died at age 47 with a history of cancer and coronary artery disease.  Her maternal grandfather is living and has coronary artery disease and COPD.  She has one living niece who has hepatic carcinoma, as well as tuberculosis.  SOCIAL HISTORY:  She is married and has one child.  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  She hopes to obtain a position with [employer] upon graduation.  She denies alcohol use.  She has smoked one pack of cigarettes per day ______ years.  REVIEW OF SYSTEMS:  See history of present illness for pertinent positives. She reports having a  chronic "smokers cough" with minimal sputum production. She also has chronic persistent heartburn, for which she takes over-the-counter medications.  She has a history of dysfunctional uterine bleeding.  She has a cystocele, which bothers her occasionally.  She has had recurrent episodes of vaginitis.  She had a history of a heart murmur with pregnancy but this has not been evaluated since that time, although she has antibiotic premedication prior to dental  work.  She denies fevers, chills, recent viral syndromes or upper respiratory infections, weight loss, fatigue, visual changes, syncope, presyncope, TIA symptoms, dysphagia, chest pain, palpitations, abdominal pain, nausea, vomiting, diarrhea or constipation, hematochezia or melena, lower extremity edema, claudication symptoms, anxiety/depression, diabetes mellitus, or thyroid problems.  PHYSICAL EXAMINATION:  VITAL SIGNS:  Blood pressure 134/72, pulse 88 and regular, respirations 18 and unlabored.  GENERAL:  This is an obese white female in no acute distress.  HEENT:  She is normocephalic and atraumatic.  Pupils equal, round, and reactive to light and accommodation.  Extraocular movements intact.  Nares patent bilaterally.  Oropharynx is clear with teeth in good repair.  NECK:  Supple without lymphadenopathy, thyromegaly, or carotid bruits.  HEART:  Regular rate and rhythm without murmurs, rubs, or gallops.  LUNGS:  Clear to auscultation.  ABDOMEN:  Soft, obese, nontender, nondistended with active bowel sounds in all quadrants.  No masses or hepatosplenomegaly.  EXTREMITIES:  No clubbing, cyanosis, or edema.  Peripheral pulses are 3+ and symmetrical in the femoral, dorsalis pedis, and posterior tibial areas.  NEUROLOGIC:  Cranial nerves II-XII grossly intact.  She is alert and oriented x3.  Gait is in normal limits.  GENITALIA/RECTUM:  Exams are deferred.  ASSESSMENT AND PLAN:  This is a 40 year old white female with a history of tobacco abuse who presents with a right lung mass.  She will be admitted to [facility] on November 04, 2001 and undergo a right video-assisted thoracic surgery with excision of a right lung mass by Dr. Edwyna Shell. Dictated by:   Tollie Pizza Collins, P.A.-C. Attending Physician:  Cameron Proud DD:  11/02/01 TD:  11/03/01 Job: [id number]

## 2010-10-18 ENCOUNTER — Emergency Department (HOSPITAL_COMMUNITY)
Admission: EM | Admit: 2010-10-18 | Discharge: 2010-10-18 | Disposition: A | Discharge status: Home / Self Care | Payer: 59 | Attending: Emergency Medicine | Admitting: Emergency Medicine

## 2010-10-18 DIAGNOSIS — S0005XA Superficial foreign body of scalp, initial encounter: Secondary | ICD-10-CM | POA: Insufficient documentation

## 2010-10-18 DIAGNOSIS — IMO0002 Reserved for concepts with insufficient information to code with codable children: Secondary | ICD-10-CM | POA: Insufficient documentation

## 2010-10-18 DIAGNOSIS — S1095XA Superficial foreign body of unspecified part of neck, initial encounter: Secondary | ICD-10-CM | POA: Insufficient documentation

## 2010-10-18 DIAGNOSIS — S0085XA Superficial foreign body of other part of head, initial encounter: Secondary | ICD-10-CM | POA: Insufficient documentation

## 2011-02-10 LAB — COMPREHENSIVE METABOLIC PANEL
AST: 20
Albumin: 3.8
Alkaline Phosphatase: 64
Chloride: 102
GFR calc Af Amer: >60
Potassium: 4.3
Sodium: 139
Total Bilirubin: 0.5

## 2011-02-10 LAB — URINALYSIS, ROUTINE W REFLEX MICROSCOPIC
Glucose, UA: NEGATIVE
Ketones, ur: NEGATIVE
Protein, ur: NEGATIVE

## 2011-02-10 LAB — CBC
Platelets: 381
WBC: 10.3

## 2011-02-10 LAB — URINE MICROSCOPIC-ADD ON

## 2011-02-10 LAB — POCT PREGNANCY, URINE: Preg Test, Ur: NEGATIVE

## 2011-03-09 ENCOUNTER — Other Ambulatory Visit: Payer: Self-pay | Admitting: Obstetrics & Gynecology

## 2011-03-09 DIAGNOSIS — Z139 Encounter for screening, unspecified: Secondary | ICD-10-CM

## 2011-03-19 ENCOUNTER — Ambulatory Visit (HOSPITAL_COMMUNITY)
Admission: RE | Admit: 2011-03-19 | Discharge: 2011-03-19 | Disposition: A | Discharge status: Home / Self Care | Payer: 59 | Source: Ambulatory Visit | Attending: Obstetrics & Gynecology | Admitting: Obstetrics & Gynecology

## 2011-03-19 DIAGNOSIS — Z139 Encounter for screening, unspecified: Secondary | ICD-10-CM

## 2011-03-19 DIAGNOSIS — Z1231 Encounter for screening mammogram for malignant neoplasm of breast: Secondary | ICD-10-CM | POA: Insufficient documentation

## 2011-03-19 IMAGING — MG MM DIGITAL SCREENING BILAT W/ CAD
5 series · 5 of 5 positions shown · non-contrast
Comparison: none

DG SCREEN MAMMOGRAM BILATERAL
Bilateral CC and MLO view(s) were taken.
Technologist: [REDACTED]

DIGITAL SCREENING MAMMOGRAM WITH CAD:
There are scattered fibroglandular densities.  No masses or malignant type calcifications are 
identified.
Images were processed with CAD.

[L CC (1 of 2)]
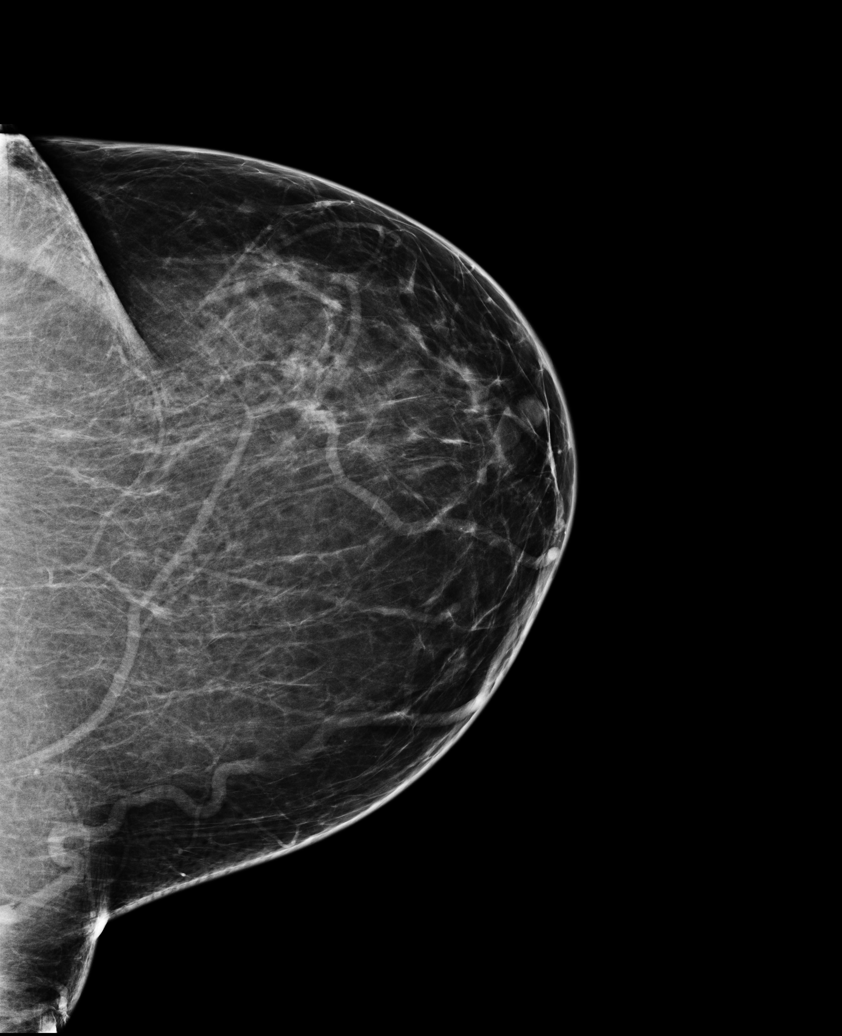

[L MLO]
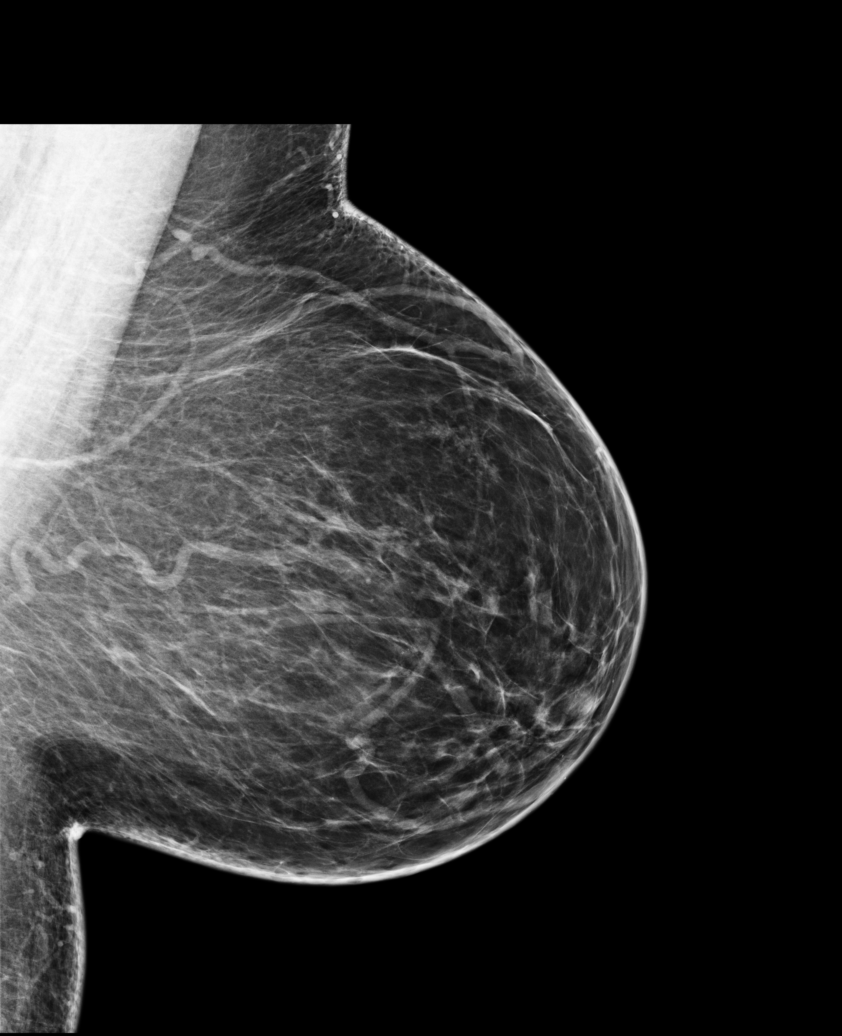

[R CC]
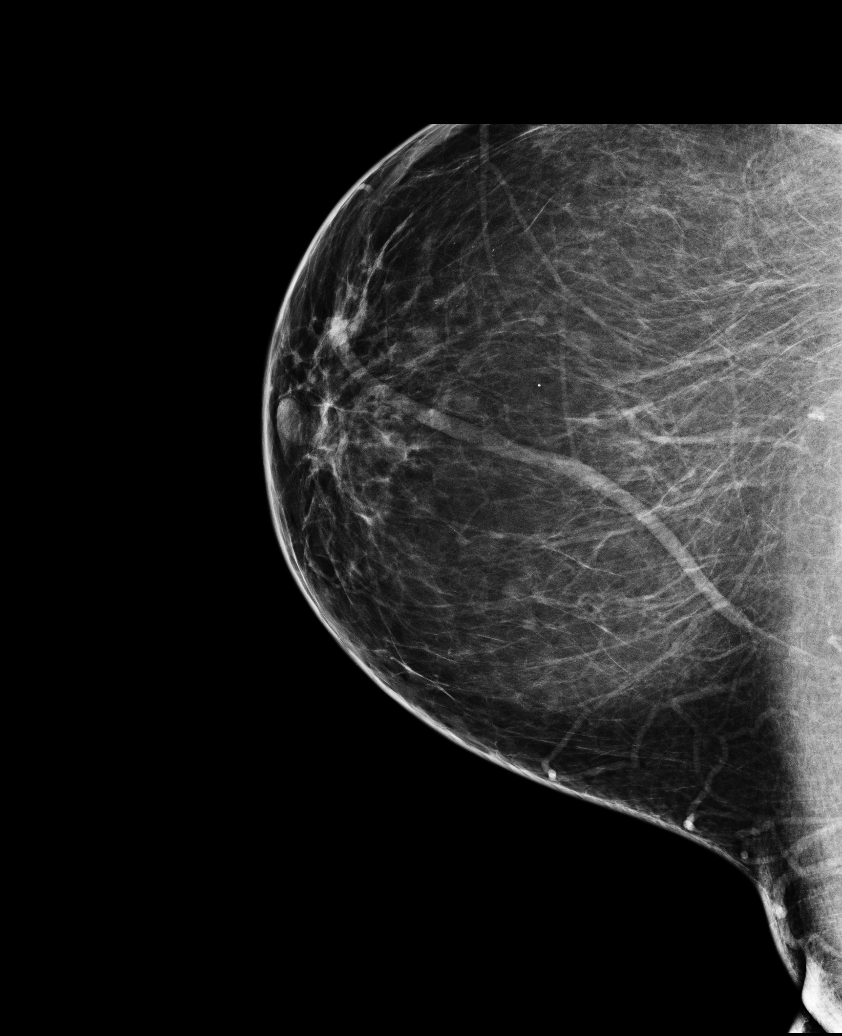

[R MLO]
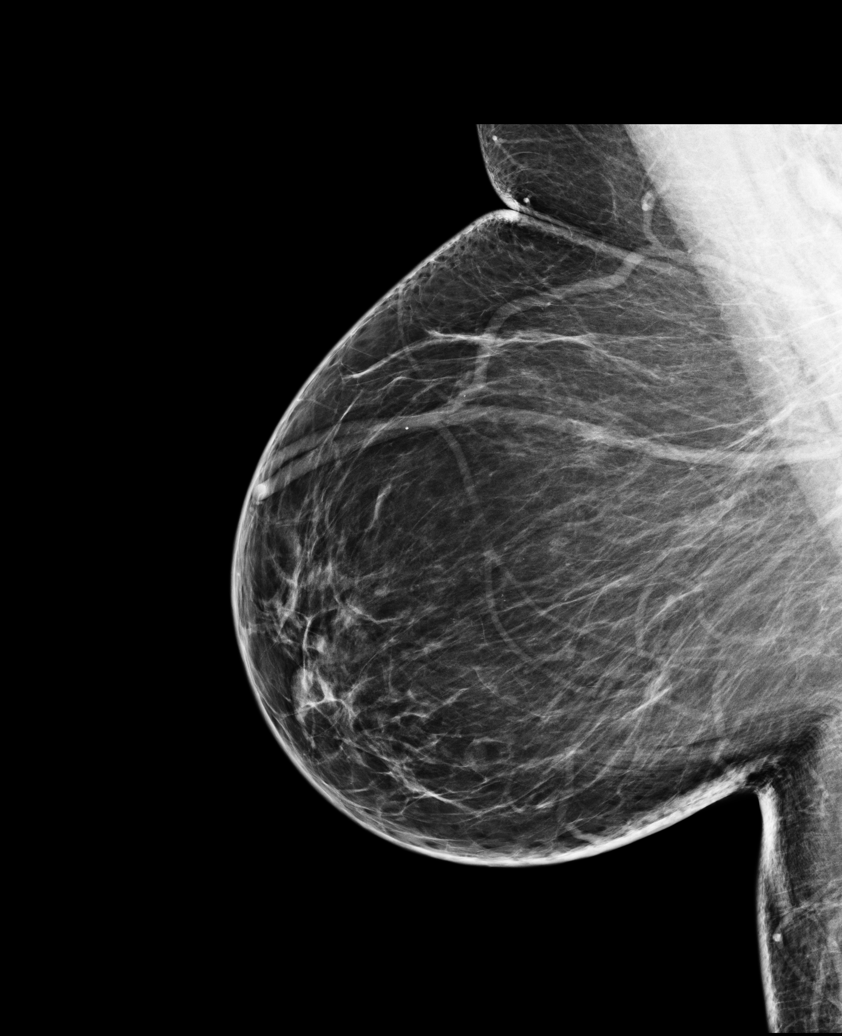

[L CC (2 of 2)]
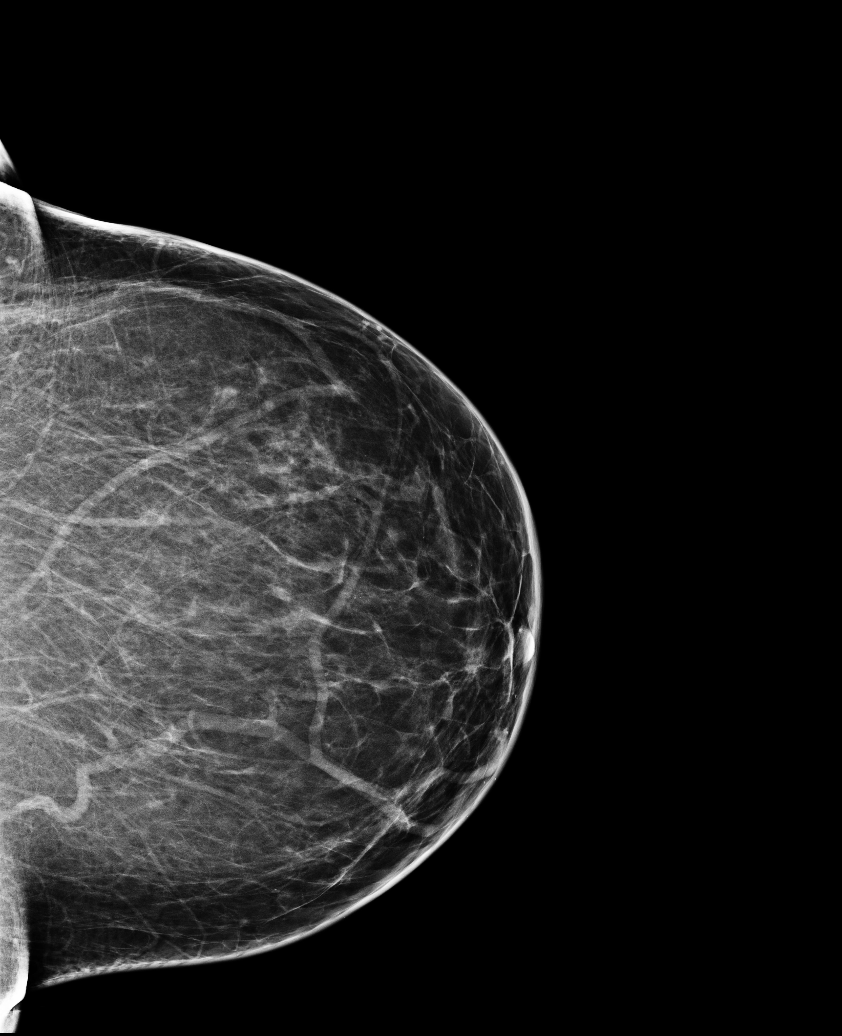

[5 of 5 positions shown; findings below may reference images not displayed]

IMPRESSION: No specific mammographic evidence of malignancy.  Next screening mammogram is recommended in one 
year.

A result letter of this screening mammogram will be mailed directly to the patient.

ASSESSMENT: Negative - BI-RADS 1

Screening mammogram in 1 year.
,

## 2011-04-19 ENCOUNTER — Emergency Department (INDEPENDENT_AMBULATORY_CARE_PROVIDER_SITE_OTHER): Payer: 59

## 2011-04-19 ENCOUNTER — Emergency Department (HOSPITAL_COMMUNITY)
Admission: EM | Admit: 2011-04-19 | Discharge: 2011-04-19 | Disposition: A | Discharge status: Home / Self Care | Payer: 59 | Source: Home / Self Care | Attending: Emergency Medicine | Admitting: Emergency Medicine

## 2011-04-19 DIAGNOSIS — IMO0002 Reserved for concepts with insufficient information to code with codable children: Secondary | ICD-10-CM

## 2011-04-19 DIAGNOSIS — S76019A Strain of muscle, fascia and tendon of unspecified hip, initial encounter: Secondary | ICD-10-CM

## 2011-04-19 HISTORY — DX: Malignant neoplasm of cervix uteri, unspecified: C53.9

## 2011-04-19 LAB — POCT URINALYSIS DIP (DEVICE)
Glucose, UA: NEGATIVE mg/dL
Hgb urine dipstick: NEGATIVE
Nitrite: NEGATIVE
Protein, ur: NEGATIVE mg/dL
Urobilinogen, UA: 0.2 mg/dL (ref 0.0–1.0)

## 2011-04-19 IMAGING — CR DG LUMBAR SPINE COMPLETE 4+V
5 series · 5 of 5 positions shown · non-contrast
Comparison: None

CLINICAL DATA: Low back pain.  Radiates to right leg.

LUMBAR SPINE - COMPLETE 4+ VIEW

[view not recorded (1 of 5)]
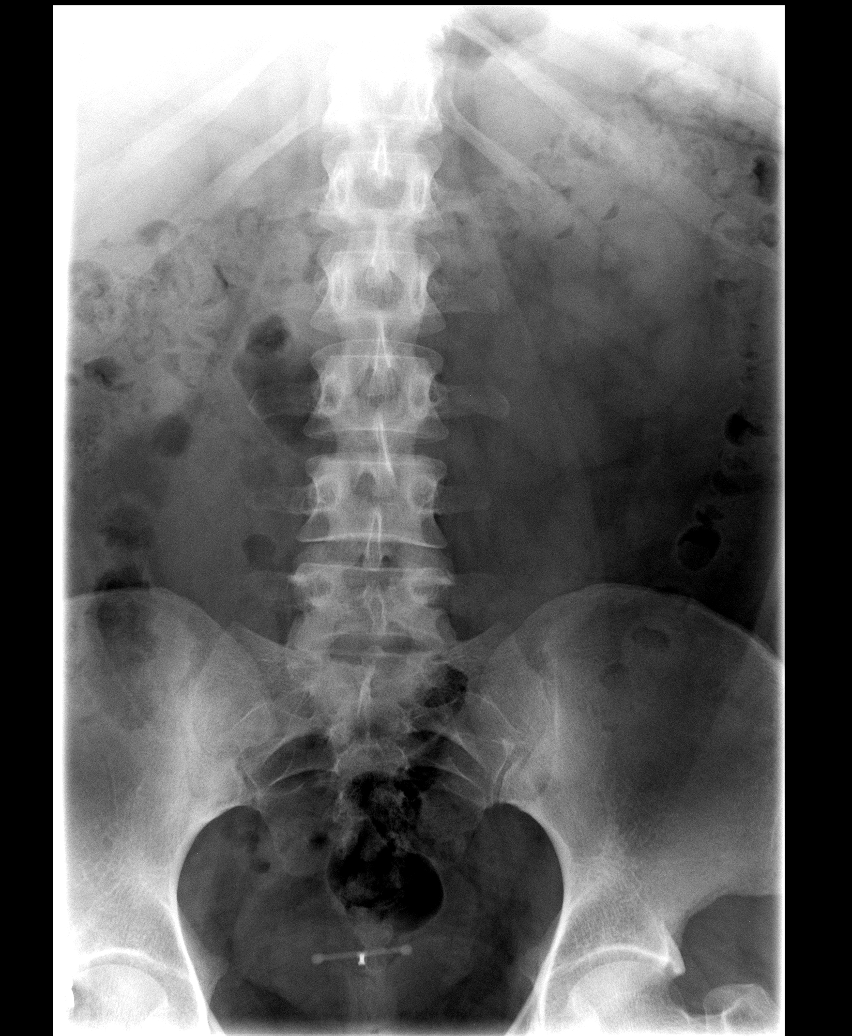

[view not recorded (2 of 5)]
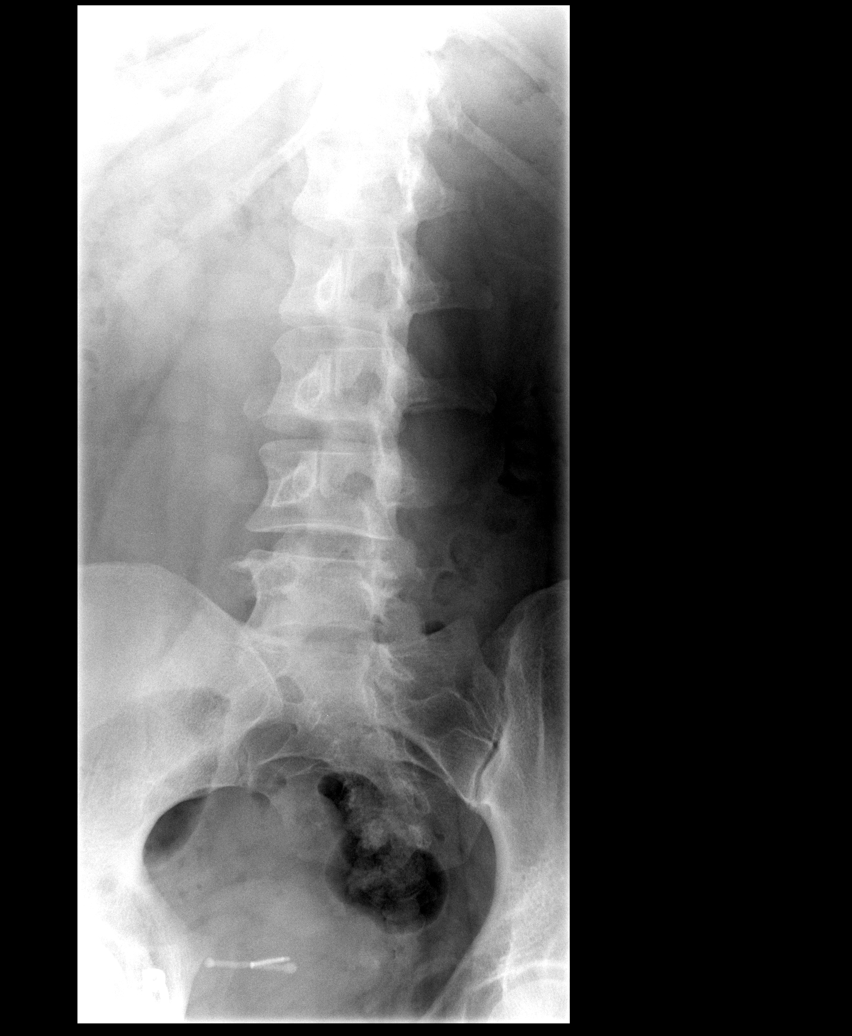

[view not recorded (3 of 5)]
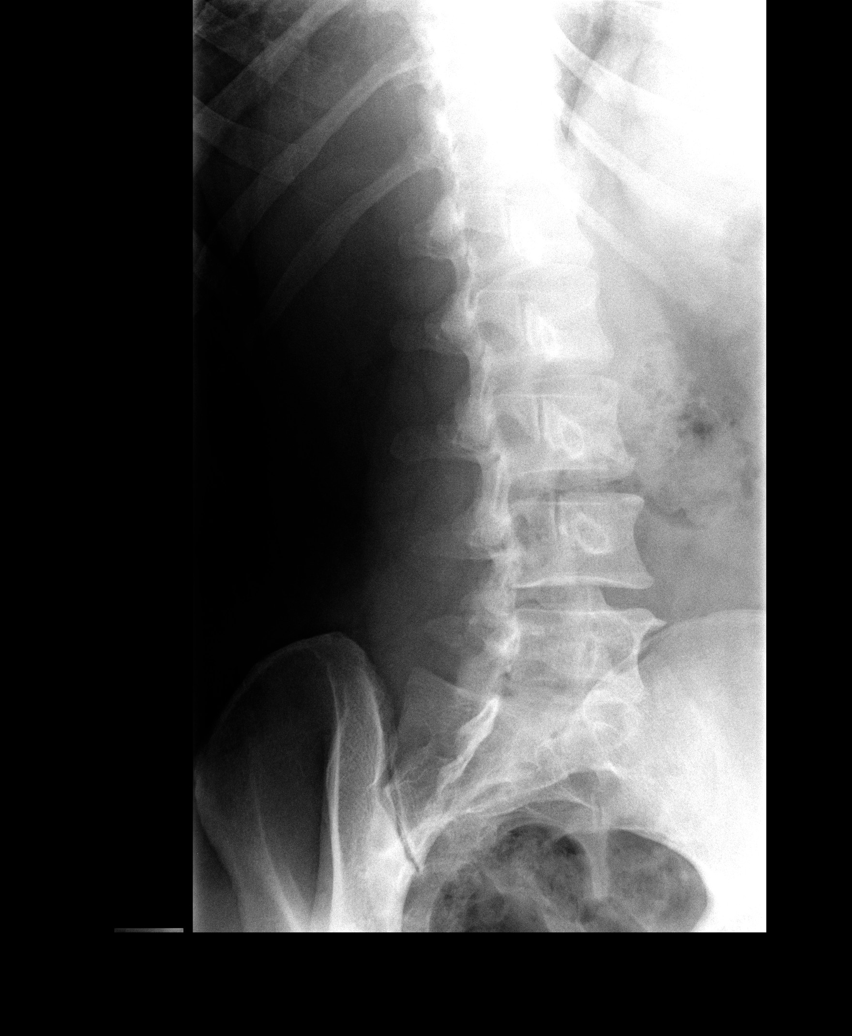

[view not recorded (4 of 5)]
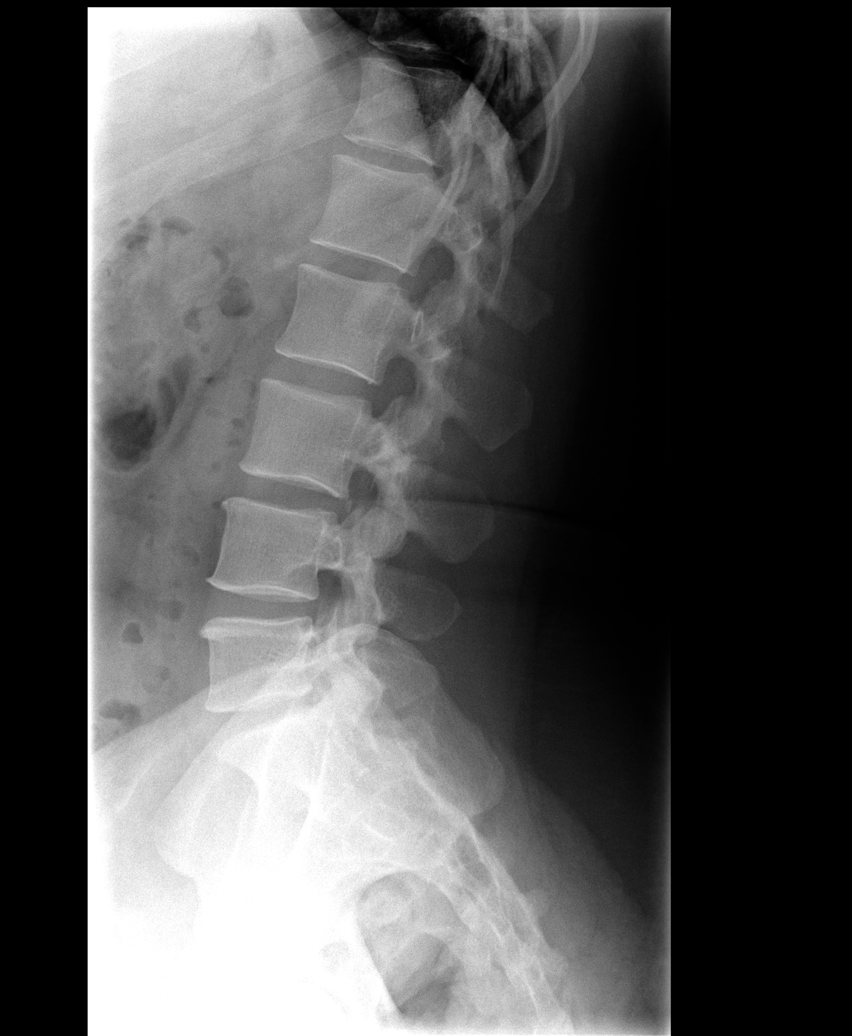

[view not recorded (5 of 5)]
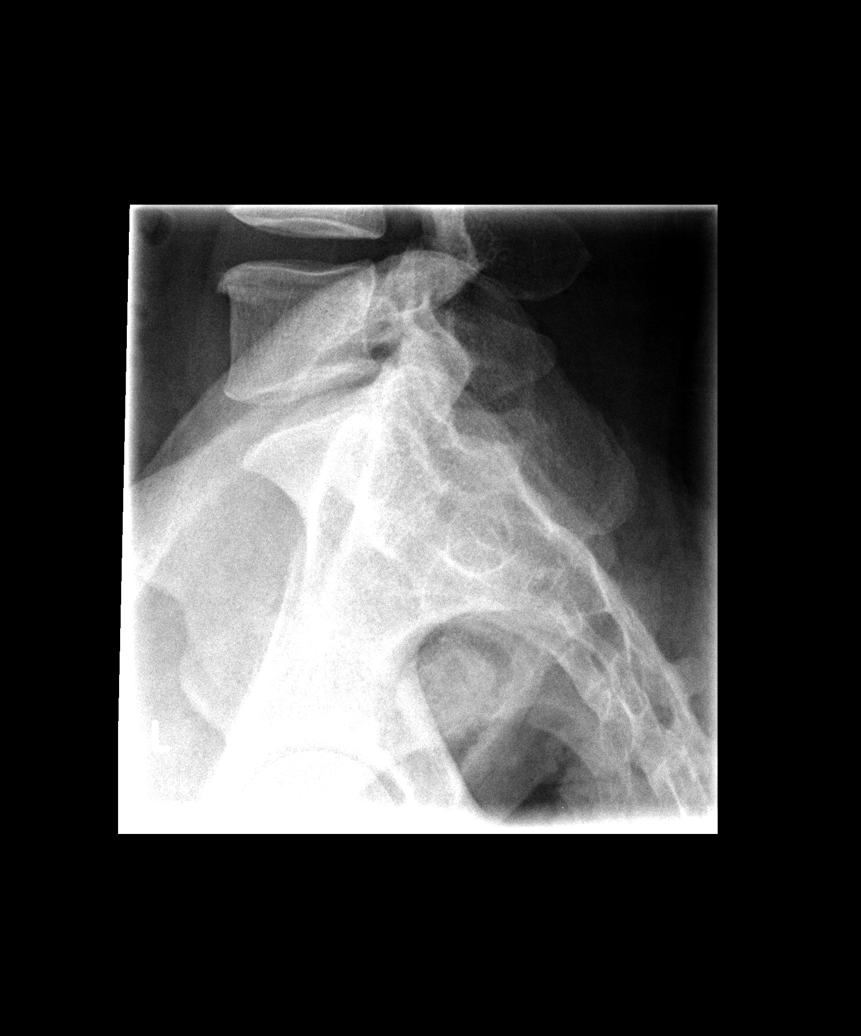

[5 of 5 positions shown; findings below may reference images not displayed]

FINDINGS: Degenerative disc disease with this space narrowing and
early osteophyte formation at L4-5 and L5-S1.  Normal alignment.
No fracture.  SI joints are symmetric and unremarkable.  IUD is in
place.
IMPRESSION: Early spondylosis.  No acute findings.

## 2011-04-19 MED ORDER — PREDNISONE 20 MG PO TABS: ORAL_TABLET | ORAL | Status: AC

## 2011-04-19 MED ORDER — DIAZEPAM 5 MG PO TABS: 5.0000 mg | ORAL_TABLET | Freq: Four times a day (QID) | ORAL | Status: AC | PRN

## 2011-04-19 MED ORDER — HYDROCODONE-ACETAMINOPHEN 5-325 MG PO TABS
2.0000 | ORAL_TABLET | Freq: Once | ORAL | Status: AC
  Administered 2011-04-19: 2 via ORAL

## 2011-04-19 MED ORDER — HYDROCODONE-ACETAMINOPHEN 5-325 MG PO TABS
ORAL_TABLET | ORAL | Status: AC
  Filled 2011-04-19: qty 2

## 2011-04-19 MED ORDER — HYDROCODONE-ACETAMINOPHEN 5-325 MG PO TABS: 2.0000 | ORAL_TABLET | ORAL | Status: AC | PRN

## 2011-04-19 NOTE — ED Notes (Signed)
Pt had fall several years ago and and had low back pain and concussion and never f/u with neurologist and pain started back three weeks ago and progressively worsened.

## 2011-04-19 NOTE — ED Provider Notes (Cosign Needed)
History     CSN: [id number] Arrival date & time: 04/19/2011 10:59 AM   First MD Initiated Contact with Patient 04/19/11 1035      Chief Complaint  Patient presents with  . Sciatica    Pt has low back pain for three weeks with pain radiating down back of rt leg    HPI Comments: Pt with 3 weeks achy right lower back pain with occ sharp shooting pain down back of leg. intially intermittent, has become constant over past few days. Primarily worse with hip flexion, going from lying to standing but states is unble to find comfortable position. Cannot sit in any one position for prolonged period of time. No hematuria, urgency, frequency, oderous or cloudy urine. No abd pain, vaginal bleeding, vaginal discharge. Pain not worst at night. No fevers, IVDU.  Pt is respiratory therapist at [employer]. denies any trauma, lifing/moving patients. H/o injusty to back in this area 2 years ago had similar pain after fall but that it resolved. H/o cervical CA with mets to uterus.   Patient is a 40 y.o. female presenting with back pain.  Back Pain  This is a new problem. The current episode started more than 1 week ago. The problem has been gradually worsening. The pain is associated with no known injury. The pain is present in the sacro-iliac joint. The quality of the pain is described as aching and shooting. The pain is the same all the time. Pertinent negatives include no chest pain, no fever, no numbness, no weight loss, no abdominal pain, no abdominal swelling, no bowel incontinence, no perianal numbness, no bladder incontinence, no dysuria, no pelvic pain, no paresthesias, no paresis, no tingling and no weakness. She has tried NSAIDs (800 mg motrin q 8 hr) for the symptoms. The treatment provided no relief. Risk factors include obesity and a history of cancer.    Past Medical History  Diagnosis Date  . Asthma   . Cervical atypia, mild   . Cervical cancer     with mets to uterus    Past Surgical History    Procedure Date  . Cervical cone biopsy   . Dilation and curettage of uterus   . Lung surgery     History reviewed. No pertinent family history.  History  Substance Use Topics  . Smoking status: Never Smoker   . Smokeless tobacco: Not on file  . Alcohol Use: No    OB History    Grav Para Term Preterm Abortions TAB SAB Ect Mult Living                  Review of Systems  Constitutional: Negative for fever and weight loss.  Respiratory: Negative for cough.   Cardiovascular: Negative for chest pain.  Gastrointestinal: Negative for nausea, vomiting, abdominal pain, diarrhea, constipation and bowel incontinence.  Genitourinary: Negative for bladder incontinence, dysuria, urgency, frequency, hematuria, vaginal bleeding, vaginal discharge, vaginal pain and pelvic pain.  Musculoskeletal: Positive for back pain. Negative for joint swelling and gait problem.  Skin: Negative for rash.  Neurological: Negative for tingling, weakness, numbness and paresthesias.    Allergies  Estrogens and Tamiflu  Home Medications   Current Outpatient Rx  Name Route Sig Dispense Refill  . IBUPROFEN 200 MG PO TABS Oral Take 800 mg by mouth every 6 (six) hours as needed.        BP 138/66  Pulse 79  Temp(Src) 98.3 F (36.8 C) (Oral)  Resp 30  SpO2 100%  Physical Exam  Nursing note and vitals reviewed. Constitutional: She is oriented to person, place, and time. She appears well-developed and well-nourished.       Appears to be in moderate painful distress  HENT:  Head: Normocephalic and atraumatic.  Eyes: EOM are normal. Pupils are equal, round, and reactive to light.  Neck: Normal range of motion.  Cardiovascular: Normal rate, regular rhythm and normal heart sounds.   Pulmonary/Chest: Effort normal and breath sounds normal.  Abdominal: Soft. Bowel sounds are normal. She exhibits no distension and no mass. There is no tenderness. There is no rebound, no guarding and no CVA tenderness.   Musculoskeletal: Normal range of motion.       Bilateral lower extremities nontender without new rashes, baseline ROM with intact PT pulses, Sensation baseline light touch bilaterally for Pt, DTR's symmetric and intact bilaterally KJ, Motor symmetric bilateral 5/5 hip flexion, quadriceps, hamstrings, EHL, foot dorsiflexion, foot plantarflexion, gait somewhat antalgic but without apparent new ataxia. Pain worse with hip flexion. No pain with full PROM hip. No tenderness at sciatic notch. No SI joint, paralumbar tenderness. No bony tenderness. SLR (-) bilaterally.  Neurological: She is alert and oriented to person, place, and time.  Skin: Skin is warm and dry.  Psychiatric: She has a normal mood and affect. Her behavior is normal. Judgment and thought content normal.    ED Course  Procedures (including critical care time)   Labs Reviewed  POCT PREGNANCY, URINE  POCT URINALYSIS DIPSTICK   No results found.   No diagnosis found.   Results for orders placed during the hospital encounter of 04/19/11  POCT PREGNANCY, URINE      Component Value Range   Preg Test, Ur NEGATIVE    POCT URINALYSIS DIP (DEVICE)      Component Value Range   Glucose, UA NEGATIVE  NEGATIVE (mg/dL)   Bilirubin Urine SMALL (*) NEGATIVE    Ketones, ur NEGATIVE  NEGATIVE (mg/dL)   Specific Gravity, Urine >=1.030  1.005 - 1.030    Hgb urine dipstick NEGATIVE  NEGATIVE    pH 5.5  5.0 - 8.0    Protein, ur NEGATIVE  NEGATIVE (mg/dL)   Urobilinogen, UA 0.2  0.0 - 1.0 (mg/dL)   Nitrite NEGATIVE  NEGATIVE    Leukocytes, UA NEGATIVE  NEGATIVE   Dg Lumbar Spine Complete  04/19/2011  *RADIOLOGY REPORT*  Clinical Data: Low back pain.  Radiates to right leg.  LUMBAR SPINE - COMPLETE 4+ VIEW  Comparison: None  Findings: Degenerative disc disease with this space narrowing and early osteophyte formation at L4-5 and L5-S1.  Normal alignment. No fracture.  SI joints are symmetric and unremarkable.  IUD is in place.  IMPRESSION:  Early spondylosis.  No acute findings.  Original Report Authenticated By: Cyndie Chime, M.D.     MDM  H&P most c/w psosas strain however has h/o cercival CA with mets to uterus. imaging indicated. Txing' pain with vicodin pt took 800 mg motrin 4 hrs ago w/o relief. Checking, UA, upreg. Will re-evaluate.  Pt with significant reduction in pain after pain meds. No new findings on re-exam. Pt ambulatory in the ED. Discussed labs imaging with pt and spouse. Agree with plan. Will d/c home and have pt f/u with Dr. Shon Baton ortho on call or [facility].    Luiz Blare, MD 04/19/11 1335

## 2011-11-09 ENCOUNTER — Encounter (HOSPITAL_COMMUNITY): Payer: Self-pay | Admitting: Emergency Medicine

## 2011-11-09 ENCOUNTER — Emergency Department (HOSPITAL_COMMUNITY)
Admission: EM | Admit: 2011-11-09 | Discharge: 2011-11-09 | Disposition: A | Discharge status: Home / Self Care | Payer: 59 | Source: Home / Self Care | Attending: Emergency Medicine | Admitting: Emergency Medicine

## 2011-11-09 DIAGNOSIS — J45909 Unspecified asthma, uncomplicated: Secondary | ICD-10-CM

## 2011-11-09 DIAGNOSIS — J069 Acute upper respiratory infection, unspecified: Secondary | ICD-10-CM

## 2011-11-09 DIAGNOSIS — J209 Acute bronchitis, unspecified: Secondary | ICD-10-CM

## 2011-11-09 DIAGNOSIS — J019 Acute sinusitis, unspecified: Secondary | ICD-10-CM

## 2011-11-09 LAB — POCT URINALYSIS DIP (DEVICE)
Bilirubin Urine: NEGATIVE
Leukocytes, UA: NEGATIVE
Nitrite: NEGATIVE
Protein, ur: 30 mg/dL — AB
Urobilinogen, UA: 2 mg/dL — ABNORMAL HIGH (ref 0.0–1.0)
pH: 8.5 — ABNORMAL HIGH (ref 5.0–8.0)

## 2011-11-09 LAB — POCT PREGNANCY, URINE: Preg Test, Ur: NEGATIVE

## 2011-11-09 MED ORDER — AMOXICILLIN-POT CLAVULANATE 875-125 MG PO TABS: 1.0000 | ORAL_TABLET | Freq: Two times a day (BID) | ORAL | Status: AC

## 2011-11-09 MED ORDER — FLUTICASONE PROPIONATE 50 MCG/ACT NA SUSP: 2.0000 | Freq: Every day | NASAL | Status: DC

## 2011-11-09 MED ORDER — PREDNISONE 5 MG PO KIT: 1.0000 | PACK | Freq: Every day | ORAL | Status: DC

## 2011-11-09 MED ORDER — HYDROCOD POLST-CHLORPHEN POLST 10-8 MG/5ML PO LQCR: 5.0000 mL | Freq: Two times a day (BID) | ORAL | Status: DC | PRN

## 2011-11-09 NOTE — Discharge Instructions (Signed)
Most upper respiratory infections are caused by viruses and do not require antibiotics.  We try to save the antibiotics for when we really need them to avoid resistance.  This does not mean that there is nothing that can be done.  Here are a few hints about things that can be done at home to get over an upper respiratory infection quicker:  Get extra sleep and extra fluids.  Get 7 to 9 hours of sleep per night and 6 to 8 glasses of water a day.  Getting extra sleep keeps the immune system from getting run down.  Most people with an upper respiratory infection are a little dehydrated.  The extra fluids also keep the secretions liquified and easier to deal with.  Also, get extra vitamin C.  4000 mg per day is the recommended dose. For the aches, headache, and fever, acetaminophen or ibuprofen are helpful.  These can be alternated every 4 hours.  People with liver disease should avoid large amounts of acetaminophen, and people with ulcer disease, gastroesophageal reflux, gastritis, congestive heart failure, chronic kidney disease, coronary artery disease and the elderly should avoid ibuprofen. For nasal congestion try Mucinex-D, or if you're having lots of sneezing or copious clear nasal drainage Allegra-D-24 hour.  A Saline nasal spray such as Ocean Spray can also help as can decongestant sprays such as Afrin, but you should not use the decongestant sprays for more than 3 or 4 days since they can be habituating.  If nasal dryness is a problem, Ayr Nasal Gel can help moisturize your nasal passages.  Breath Rite nasal strips can also offer a non-drug alternative treatment to nasal congestion, especially at night. For people with symptoms of sinusitis, sleeping with your head elevated can be helpful.  For sinus pain, moist, hot compresses to the face may provide some relief.  Many people find that inhaling steam as in a shower or from a pot of steaming water can help. For sore throat, zinc containing lozenges such  as Cold-Eze or Zicam are helpful.  Zinc helps to fight infection and has a mild astringent effect that relieves the sore, achey throat.  Hot salt water gargles (8 oz of hot water, 1/2 tsp of table salt, and a pinch of baking soda) can give relief as well as hot beverages such as hot tea. For the cough, old time remedies such as honey or honey and lemon are tried and true.  Over the counter cough syrups such as Delsym 2 tsp every 12 hours can help as well.  It has also been found recently that Aleve can help control a cough.  The dose is 1 to 2 tablets twice daily with food.  This can be combined with Delsym. (Note, if you are taking ibuprofen, you should not take Aleve as well--take one or the other.)  It's important when you have an upper respiratory infection not to pass the infection to others.  This involves being very careful about the following:  Frequent hand washing or use of hand sanitizer, especially after coughing, sneezing, blowing your nose or touching your face, nose or eyes. Do not shake hands or touch anyone and try to avoid touching surfaces that other people use such as doorknobs, shopping carts, telephones and computer keyboards. Use tissues and dispose of them properly in a garbage can or ziplock bag. Cough into your sleeve. Do not let others eat or drink after you.  It's also important to recognize the signs of serious illness and   get evaluated if they occur: Any respiratory infection that lasts more than 7 to 10 days.  Yellow nasal drainage and sputum are not reliable indicators of a bacterial infection, but if they last for more than 1 week, see your doctor. Fever and sore throat can indicate strep. Fever and cough can indicate influenza or pneumonia. Any kind of severe symptom such as difficulty breathing, intractable vomiting, or severe pain should prompt you to see a doctor as soon as possible.   Your body's immune system is really the thing that will get rid of this  infection.  Your immune system is comprised of 2 types of specialized cells called T cells and B cells.  T cells coordinate the array of cells in your body that engulf invading bacteria or viruses while B cells orchestrate the production of antibodies that neutralize infection.  Anything we do or any medications we give you, will just strengthen your immune system or help it clear up the infection quicker.  Here are a few helpful hints to improve your immune system to help overcome this illness or to prevent future infections:  A few vitamins can improve the health of your immune system.  That's why your diet should include plenty of fruits, vegetables, fish, nuts, and whole grains.  Vitamin A and bet-carotene can increase the cells that fight infections (T cells and B cells).  Vitamin A is abundant in dark greens and orange vegetables such as spinach, greens, sweet potatoes, and carrots.  Vitamin B6 contributes to the maturation of white blood cells, the cells that fight disease.  Foods with vitamin B6 include cold cereal and bananas.  Vitamin C is credited with preventing colds because it increases white blood cells and also prevents cellular damage.  Citrus fruits, peaches and green and red bell peppers are all hight in vitamin C.  Vitamin E is an anti-oxidant that encourages the production of natural killer cells which reject foreign invaders and B cells that produce antibodies.  Foods high in vitamin E include wheat germ, nuts and seeds.  Foods high in omega-3 fatty acids found in foods like salmon, tuna and mackerel boost your immune system and help cells to engulf and absorb germs.  Probiotics are good bacteria that increase your T cells.  These can be found in yogurt and are available in supplements such as Culturelle or Align.  Moderate exercise increases the strength of your immune system and your ability to recover from illness.  I suggest 3 to 5 moderate intensity 30 minute workouts per  week.    Sleep is another component of maintaining a strong immune system.  It enables your body to recuperate from the day's activities, stress and work.  My recommendation is to get between 7 and 9 hours of sleep per night.  If you smoke, try to quit completely or at least cut down.  Drink alcohol only in moderation if at all.  No more than 2 drinks daily for men or 1 for women.  Get a flu vaccine early in the fall or if you have not gotten one yet, once this illness has run its course.  If you are over 65, a smoker, or an asthmatic, get a pneumococcal vaccine.  My final recommendation is to maintain a healthy weight.  Excess weight can impair the immune system by interfering with the way the immune system deals with invading viruses or bacteria.   

## 2011-11-09 NOTE — ED Provider Notes (Signed)
Chief Complaint  Patient presents with  . Cough    History of Present Illness:   The patient is a 41 year old female who has had a one-week history of nasal congestion with yellow-green drainage, cough productive of yellow sputum or sometimes just a dry cough, wheezing, chest tightness, sore throat, ear congestion, chills, nausea, and abdominal and chest pain. She denies any fever or chills.  Review of Systems:  Other than noted above, the patient denies any of the following symptoms. Systemic:  No fever, chills, sweats, fatigue, myalgias, headache, or anorexia. Eye:  No redness, pain or drainage. ENT:  No earache, ear congestion, nasal congestion, sneezing, rhinorrhea, sinus pressure, sinus pain, post nasal drip, or sore throat. Lungs:  No cough, sputum production, wheezing, shortness of breath, or chest pain. GI:  No abdominal pain, nausea, vomiting, or diarrhea. Skin:  No rash or itching.  PMFSH:  Past medical history, family history, social history, meds, and allergies were reviewed.  Physical Exam:   Vital signs:  BP 137/64  Pulse 86  Temp 98.4 F (36.9 C) (Oral)  Resp 18  SpO2 99% General:  Alert, in no distress. Eye:  No conjunctival injection or drainage. Lids were normal. ENT:  TMs were slightly erythematous and dull.  Nasal mucosa was congested with some dry, crusted drainage.  Mucous membranes were moist.  Pharynx was erythematous without exudate or drainage.  There were no oral ulcerations or lesions. Neck:  Supple, no adenopathy, tenderness or mass. Lungs:  No respiratory distress.  Lungs were clear to auscultation, without wheezes, rales or rhonchi.  Breath sounds were clear and equal bilaterally. Lungs were resonant to percussion.  No egophony. Heart:  Regular rhythm, without gallops, murmers or rubs. Skin:  Clear, warm, and dry, without rash or lesions.  Labs:   Results for orders placed during the hospital encounter of 11/09/11  POCT URINALYSIS DIP (DEVICE)   Component Value Range   Glucose, UA NEGATIVE  NEGATIVE mg/dL   Bilirubin Urine NEGATIVE  NEGATIVE   Ketones, ur NEGATIVE  NEGATIVE mg/dL   Specific Gravity, Urine 1.010  1.005 - 1.030   Hgb urine dipstick NEGATIVE  NEGATIVE   pH 8.5 (*) 5.0 - 8.0   Protein, ur 30 (*) NEGATIVE mg/dL   Urobilinogen, UA 2.0 (*) 0.0 - 1.0 mg/dL   Nitrite NEGATIVE  NEGATIVE   Leukocytes, UA NEGATIVE  NEGATIVE  POCT PREGNANCY, URINE      Component Value Range   Preg Test, Ur NEGATIVE  NEGATIVE   Assessment:  The primary encounter diagnosis was Viral upper respiratory infection. Diagnoses of Acute sinusitis, Acute bronchitis, and Asthma were also pertinent to this visit.  Plan:   1.  The following meds were prescribed:   New Prescriptions   AMOXICILLIN-CLAVULANATE (AUGMENTIN) 875-125 MG PER TABLET    Take 1 tablet by mouth 2 (two) times daily.   CHLORPHENIRAMINE-HYDROCODONE (TUSSIONEX) 10-8 MG/5ML LQCR    Take 5 mLs by mouth every 12 (twelve) hours as needed.   FLUTICASONE (FLONASE) 50 MCG/ACT NASAL SPRAY    Place 2 sprays into the nose daily.   PREDNISONE 5 MG KIT    Take 1 kit (5 mg total) by mouth daily after breakfast. Prednisone 5 mg 6 day dosepack.  Take as directed.   2.  The patient was instructed in symptomatic care and handouts were given. 3.  The patient was told to return if becoming worse in any way, if no better in 3 or 4 days, and given some  red flag symptoms that would indicate earlier return.   Reuben Likes, MD 11/09/11 2008

## 2011-11-09 NOTE — ED Notes (Signed)
Pt  Has  Symptoms  Of  Cough  /  Congested   Pain in  Chest  When  She  Takes  Deep  Breath     She  Reports  She  Has  Asthma      And  Uses  An inhaler  She  Also  Reports   Frequency  Incontinence         When  She  Coughs          She  Reports  The  Symptoms  Began as   Nasal  Congestion

## 2012-04-11 ENCOUNTER — Emergency Department (HOSPITAL_COMMUNITY): Payer: 59

## 2012-04-11 ENCOUNTER — Emergency Department (HOSPITAL_COMMUNITY)
Admission: EM | Admit: 2012-04-11 | Discharge: 2012-04-11 | Disposition: A | Discharge status: Home / Self Care | Payer: 59 | Attending: Emergency Medicine | Admitting: Emergency Medicine

## 2012-04-11 ENCOUNTER — Encounter (HOSPITAL_COMMUNITY): Payer: Self-pay | Admitting: *Deleted

## 2012-04-11 DIAGNOSIS — E039 Hypothyroidism, unspecified: Secondary | ICD-10-CM | POA: Insufficient documentation

## 2012-04-11 DIAGNOSIS — IMO0002 Reserved for concepts with insufficient information to code with codable children: Secondary | ICD-10-CM | POA: Insufficient documentation

## 2012-04-11 DIAGNOSIS — D72829 Elevated white blood cell count, unspecified: Secondary | ICD-10-CM | POA: Insufficient documentation

## 2012-04-11 DIAGNOSIS — R112 Nausea with vomiting, unspecified: Secondary | ICD-10-CM | POA: Insufficient documentation

## 2012-04-11 DIAGNOSIS — Z79899 Other long term (current) drug therapy: Secondary | ICD-10-CM | POA: Insufficient documentation

## 2012-04-11 DIAGNOSIS — R109 Unspecified abdominal pain: Secondary | ICD-10-CM | POA: Insufficient documentation

## 2012-04-11 DIAGNOSIS — C539 Malignant neoplasm of cervix uteri, unspecified: Secondary | ICD-10-CM | POA: Insufficient documentation

## 2012-04-11 DIAGNOSIS — R509 Fever, unspecified: Secondary | ICD-10-CM | POA: Insufficient documentation

## 2012-04-11 DIAGNOSIS — Z3202 Encounter for pregnancy test, result negative: Secondary | ICD-10-CM | POA: Insufficient documentation

## 2012-04-11 DIAGNOSIS — J45909 Unspecified asthma, uncomplicated: Secondary | ICD-10-CM | POA: Insufficient documentation

## 2012-04-11 LAB — URINALYSIS, ROUTINE W REFLEX MICROSCOPIC
Glucose, UA: NEGATIVE mg/dL
Hgb urine dipstick: NEGATIVE
Leukocytes, UA: NEGATIVE
Specific Gravity, Urine: 1.025 (ref 1.005–1.030)
Urobilinogen, UA: 0.2 mg/dL (ref 0.0–1.0)

## 2012-04-11 LAB — COMPREHENSIVE METABOLIC PANEL
AST: 14 U/L (ref 0–37)
Albumin: 3.6 g/dL (ref 3.5–5.2)
BUN: 7 mg/dL (ref 6–23)
Calcium: 9.7 mg/dL (ref 8.4–10.5)
Creatinine, Ser: 0.76 mg/dL (ref 0.50–1.10)
GFR calc non Af Amer: >90 mL/min (ref >90)

## 2012-04-11 LAB — CBC WITH DIFFERENTIAL/PLATELET
Basophils Absolute: 0 10*3/uL (ref 0.0–0.1)
Basophils Relative: 0 % (ref 0–1)
Eosinophils Absolute: 0.1 10*3/uL (ref 0.0–0.7)
Eosinophils Relative: 1 % (ref 0–5)
HCT: 42.7 % (ref 36.0–46.0)
MCH: 29.8 pg (ref 26.0–34.0)
MCHC: 34.7 g/dL (ref 30.0–36.0)
MCV: 86.1 fL (ref 78.0–100.0)
Monocytes Absolute: 0.9 10*3/uL (ref 0.1–1.0)
RDW: 12.7 % (ref 11.5–15.5)

## 2012-04-11 LAB — PREGNANCY, URINE: Preg Test, Ur: NEGATIVE

## 2012-04-11 IMAGING — CT CT ABD-PELV W/ CM
1 series · 15 of 32 positions shown, 19 images · IV contrast (OMNIPAQUE 300)
Comparison: None.

CLINICAL DATA: Right lower quadrant pain.

CT ABDOMEN AND PELVIS WITH CONTRAST
TECHNIQUE: Multidetector CT imaging of the abdomen and pelvis was
performed following the standard protocol during bolus
administration of intravenous contrast.
Contrast: 100mL OMNIPAQUE IOHEXOL 300 MG/ML  SOLN

[Series 2: abd/pel with · axial · 0.97mm/px · z∈[+1283,+1718]mm · 15 of 98 slices shown, 19 images]
[im 7/98  soft-tissue]
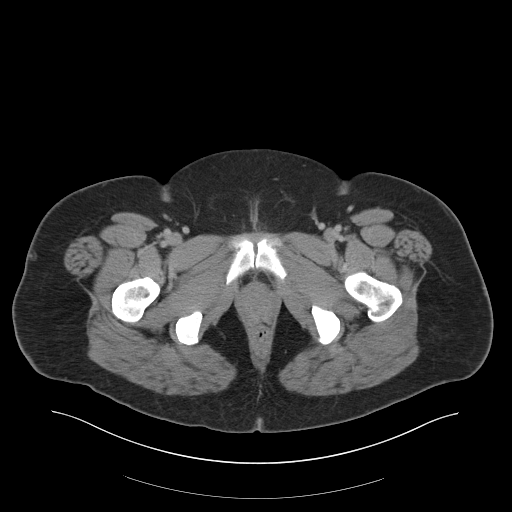
[im 7/98  bone]
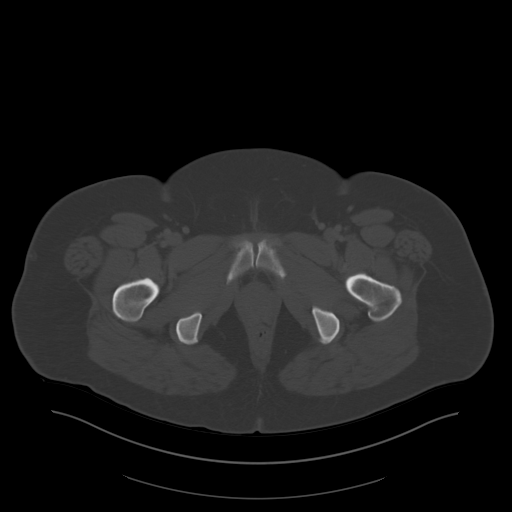
[im 13/98  soft-tissue]
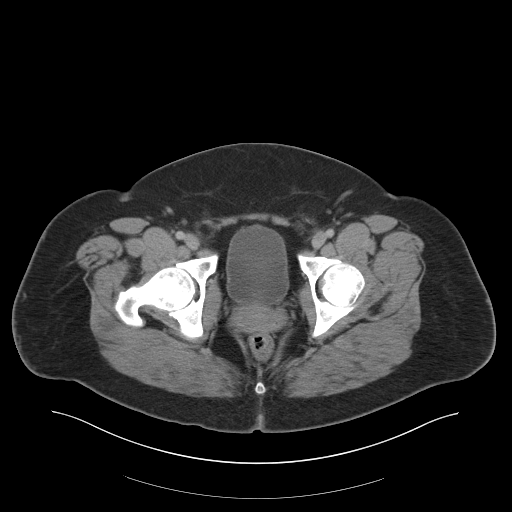
[im 19/98  soft-tissue]
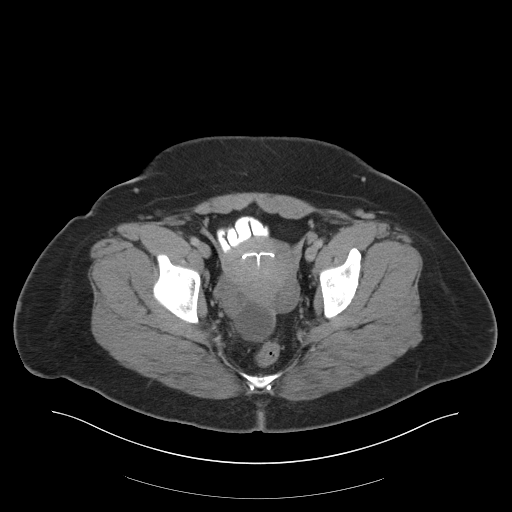
[im 29/98  soft-tissue]
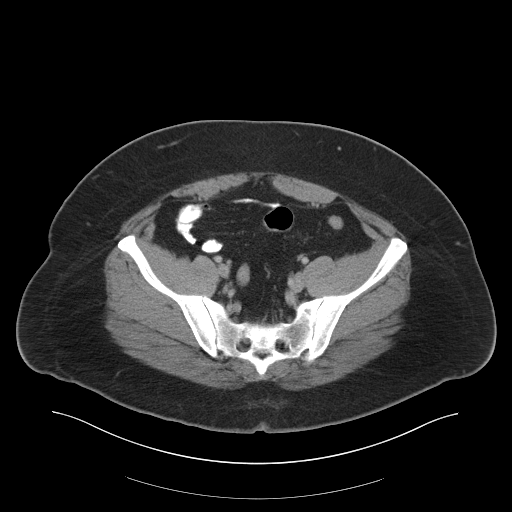
[im 35/98  soft-tissue]
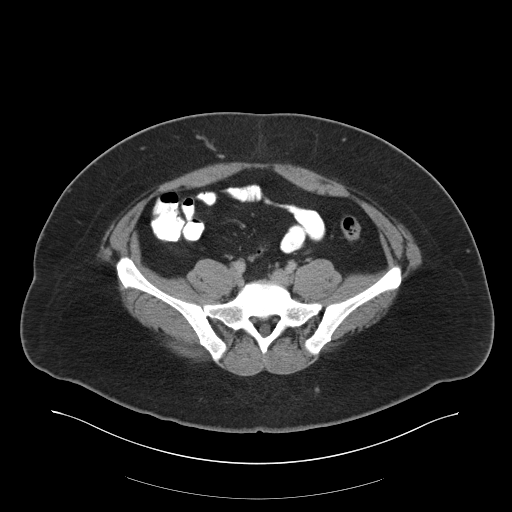
[im 41/98  soft-tissue]
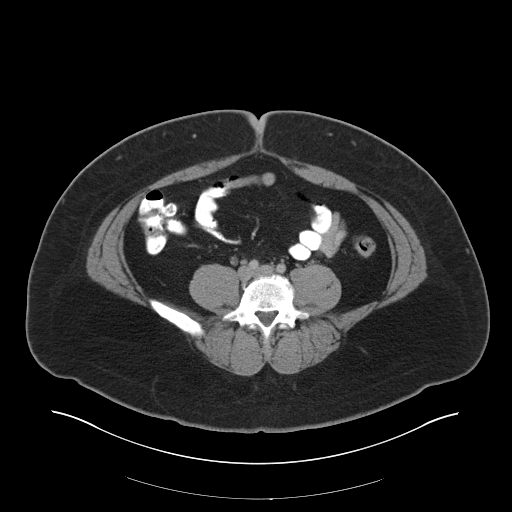
[im 51/98  soft-tissue]
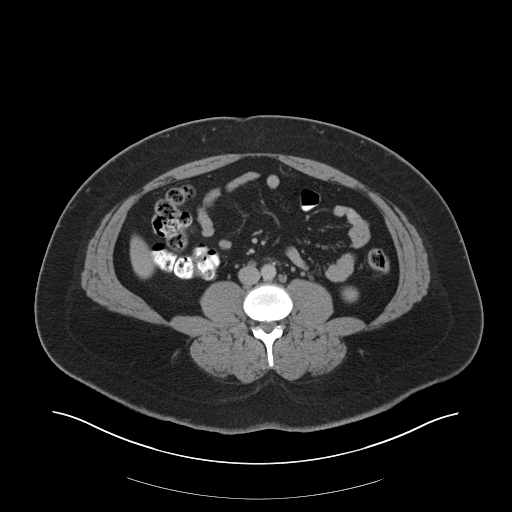
[im 57/98  soft-tissue]
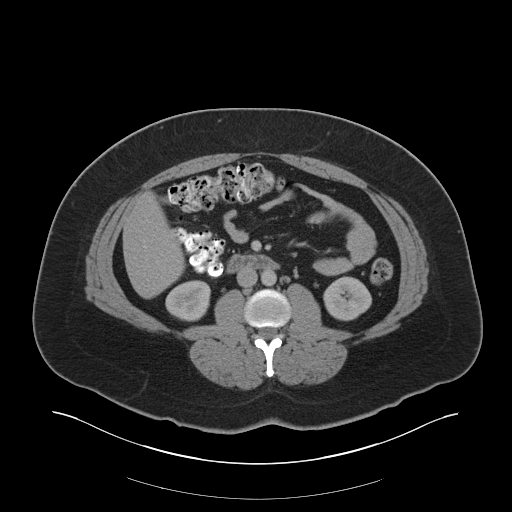
[im 63/98  soft-tissue]
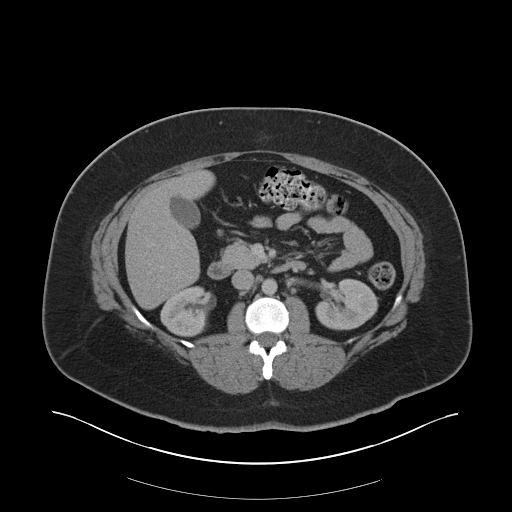
[im 63/98  bone]
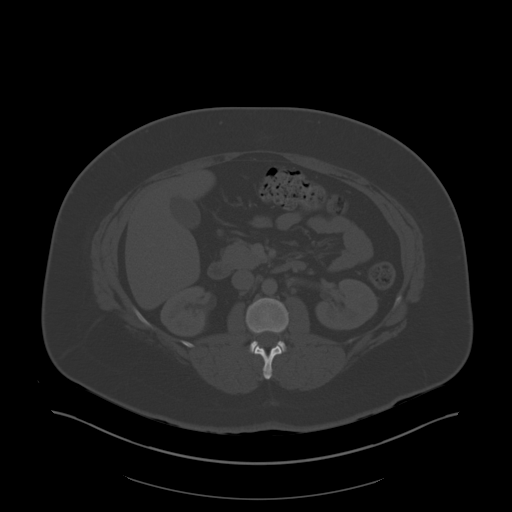
[im 69/98  soft-tissue]
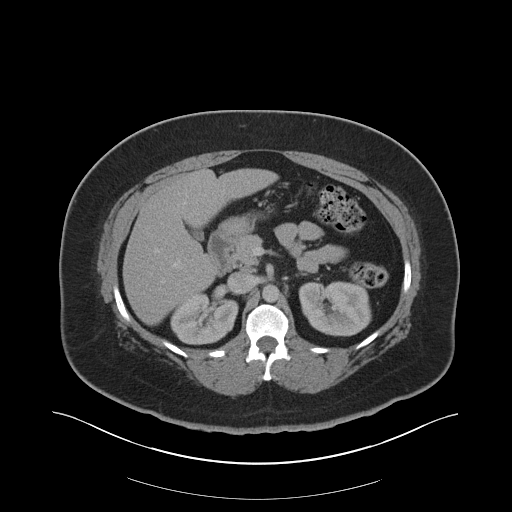
[im 79/98  soft-tissue]
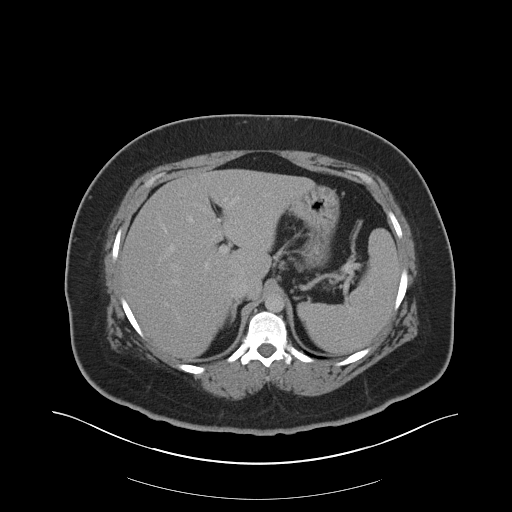
[im 85/98  soft-tissue]
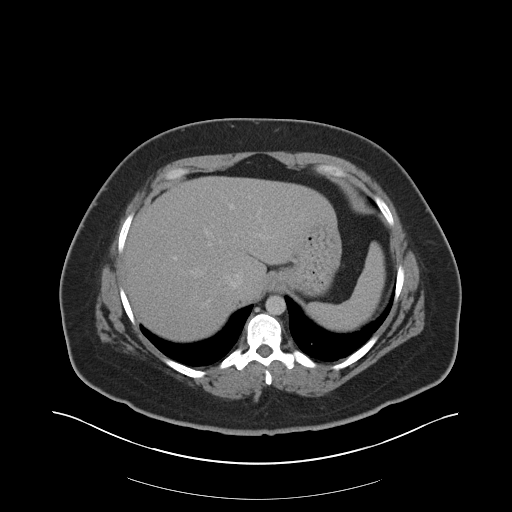
[im 85/98  lung]
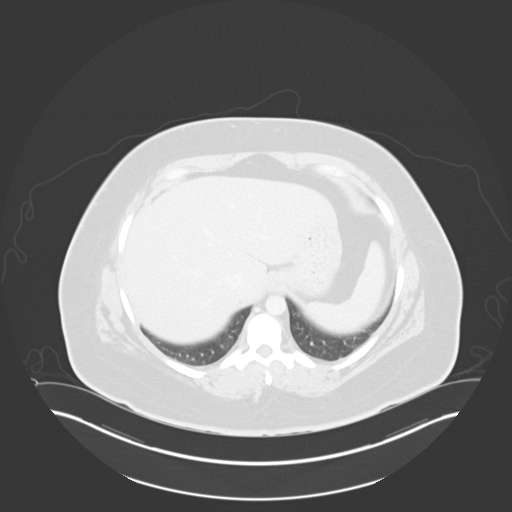
[im 88/98  lung]
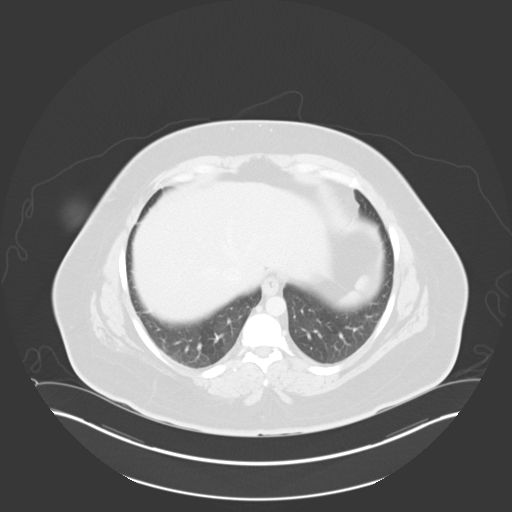
[im 91/98  soft-tissue]
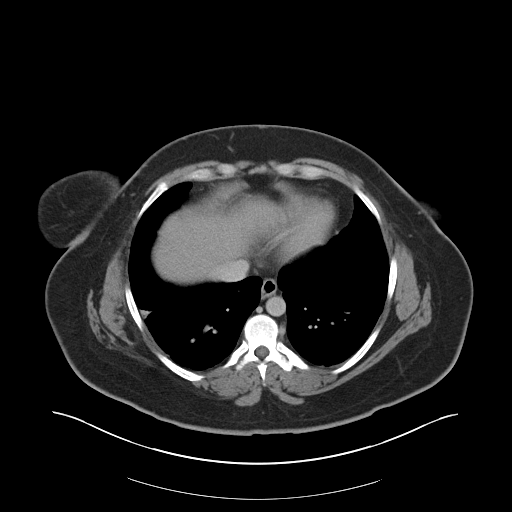
[im 91/98  lung]
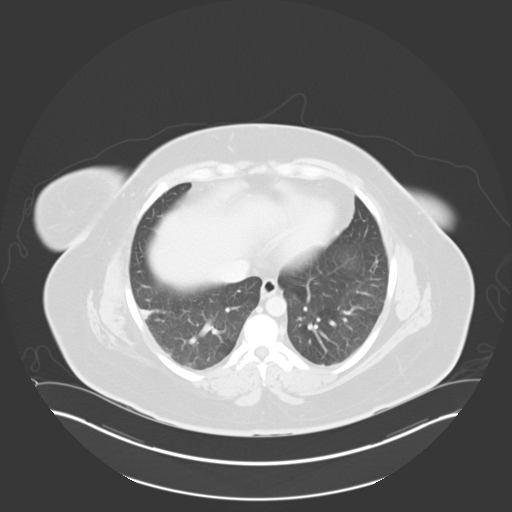
[im 94/98  lung]
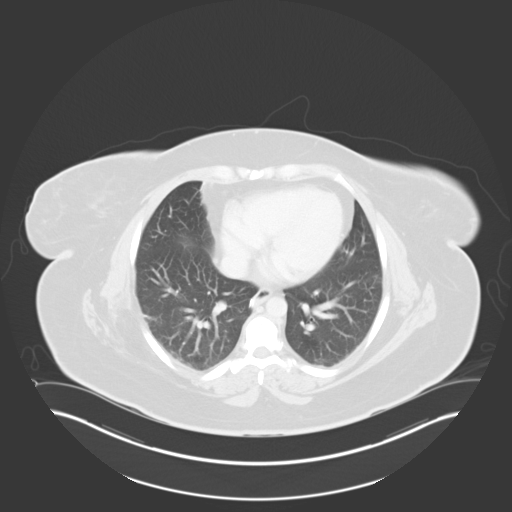

[15 of 32 positions shown; findings below may reference images not displayed]

FINDINGS: There is a focal pleural based density in the right lower
lung.  There is no evidence for free intraperitoneal air.

There are small surgical clips around the distal esophagus.  Normal
appearance of the liver, portal venous system and gallbladder.
Normal appearance of the spleen, pancreas, adrenal glands and both
kidneys.  Normal appearance of the stomach and duodenum.

There is an IUD present.  There is a 5.2 x 4.3 x 4.5 cm oval shaped
low density structure just posterior to the uterus and posterior to
the right adnexa.  This structure probably represents an ovarian
cyst.  Normal appearance of the small and large bowel.  Normal
appearance of the appendix.  No evidence for free fluid.  There are
prominent external iliac lymph nodes bilaterally.  Index node on
the right on sequence two, image 78 measures 1.2 cm in the short
axis.  Left external iliac lymph node measures 1 cm in the short
axis.  Lymph nodes throughout the inguinal regions bilaterally.

No acute bony abnormality.
IMPRESSION: 5.2 cm low density structure probably associated with the right
adnexa.  This could represent a large ovarian cyst.  Based on the
size of this lesion, recommend further evaluation with a pelvic
ultrasound.

Prominent lymph nodes in the external iliac stations bilaterally.
These findings are nonspecific.  No evidence for free fluid.

Evidence for postsurgical changes adjacent to the distal esophagus.
There may be a focal area of scarring or pleural based atelectasis
in the right lower lobe.

## 2012-04-11 MED ORDER — PROMETHAZINE HCL 25 MG/ML IJ SOLN
12.5000 mg | Freq: Once | INTRAMUSCULAR | Status: AC
  Administered 2012-04-11: 12.5 mg via INTRAVENOUS
  Filled 2012-04-11: qty 1

## 2012-04-11 MED ORDER — ONDANSETRON HCL 4 MG/2ML IJ SOLN
4.0000 mg | Freq: Once | INTRAMUSCULAR | Status: AC
  Administered 2012-04-11: 4 mg via INTRAVENOUS

## 2012-04-11 MED ORDER — ONDANSETRON HCL 4 MG PO TABS: 4.0000 mg | ORAL_TABLET | Freq: Four times a day (QID) | ORAL | Status: DC

## 2012-04-11 MED ORDER — ONDANSETRON HCL 4 MG/2ML IJ SOLN
INTRAMUSCULAR | Status: AC
  Administered 2012-04-11: 4 mg via INTRAVENOUS
  Filled 2012-04-11: qty 2

## 2012-04-11 MED ORDER — IOHEXOL 300 MG/ML  SOLN
100.0000 mL | Freq: Once | INTRAMUSCULAR | Status: AC | PRN
  Administered 2012-04-11: 100 mL via INTRAVENOUS

## 2012-04-11 MED ORDER — ACETAMINOPHEN 325 MG PO TABS
650.0000 mg | ORAL_TABLET | Freq: Once | ORAL | Status: AC
  Administered 2012-04-11: 650 mg via ORAL
  Filled 2012-04-11: qty 2

## 2012-04-11 NOTE — ED Notes (Addendum)
Pt reports vomiting and RLQ abd pain since Friday. States was seen at [facility] office 2 fridays ago and had a white count of unknown origin. Reports now having pain and vomiting with fever.

## 2012-04-11 NOTE — ED Notes (Signed)
Pt transported back to room from CT.  

## 2012-04-11 NOTE — ED Notes (Signed)
Patient transported to CT 

## 2012-04-11 NOTE — ED Provider Notes (Signed)
History     CSN: [id number]  Arrival date & time 04/11/12  1037   First MD Initiated Contact with Patient 04/11/12 1125      Chief Complaint  Patient presents with  . Abdominal Pain  . Nausea  . Emesis    (Consider location/radiation/quality/duration/timing/severity/associated sxs/prior treatment) Patient is a 41 y.o. female presenting with abdominal pain and vomiting. The history is provided by the patient.  Abdominal Pain The primary symptoms of the illness include abdominal pain, nausea and vomiting. The primary symptoms of the illness do not include fever, shortness of breath, dysuria, vaginal discharge or vaginal bleeding.  Symptoms associated with the illness do not include chills. Associated symptoms comments: She has complaint of nausea and vomiting since yesterday with fever. Similar symptoms 10 days ago with Tmax 104 temperature, N, V and, per patient, significant unexplained leukocytosis. She was treated with a broad spectrum abx and reports symptoms resolved. Unrelated to this illness, she has had recent gynecologic evaluation for right adnexal mass diagnosed as "paratubal cyst" and is currently being followed and evaluated for cervical cancer, possible uterine involvement. Evaluation is ongoing..  Emesis  Associated symptoms include abdominal pain. Pertinent negatives include no chills, no cough, no fever and no myalgias.    Past Medical History  Diagnosis Date  . Asthma   . Cervical atypia, mild   . Cervical cancer     with mets to uterus  . Thyroid disease     hypothyroidism    Past Surgical History  Procedure Date  . Cervical cone biopsy   . Dilation and curettage of uterus   . Lung surgery     History reviewed. No pertinent family history.  History  Substance Use Topics  . Smoking status: Never Smoker   . Smokeless tobacco: Not on file  . Alcohol Use: No    OB History    Grav Para Term Preterm Abortions TAB SAB Ect Mult Living                   Review of Systems  Constitutional: Negative for fever and chills.  Respiratory: Negative.  Negative for cough and shortness of breath.   Cardiovascular: Negative.  Negative for chest pain.  Gastrointestinal: Positive for nausea, vomiting and abdominal pain. Negative for blood in stool.  Genitourinary: Negative.  Negative for dysuria, vaginal bleeding, vaginal discharge and vaginal pain.  Musculoskeletal: Negative.  Negative for myalgias.  Neurological: Negative.     Allergies  Estrogens and Tamiflu  Home Medications   Current Outpatient Rx  Name  Route  Sig  Dispense  Refill  . ALBUTEROL SULFATE HFA 108 (90 BASE) MCG/ACT IN AERS   Inhalation   Inhale 2 puffs into the lungs every 6 (six) hours as needed. wheezing         . FLUTICASONE PROPIONATE 50 MCG/ACT NA SUSP   Nasal   Place 2 sprays into the nose daily.   16 g   0   . IBUPROFEN 200 MG PO TABS   Oral   Take 800 mg by mouth every 6 (six) hours as needed. pain         . LEVOTHYROXINE SODIUM 50 MCG PO TABS   Oral   Take 50 mcg by mouth daily.           BP 107/61  Pulse 109  Temp 99 F (37.2 C) (Oral)  Resp 20  Ht 5\' 4"  (1.626 m)  Wt 240 lb (108.863 kg)  BMI 41.20  kg/m2  SpO2 99%  Physical Exam  Constitutional: She is oriented to person, place, and time. She appears well-developed and well-nourished.  HENT:  Head: Normocephalic.  Neck: Normal range of motion. Neck supple.  Cardiovascular: Normal rate and regular rhythm.   Pulmonary/Chest: Effort normal and breath sounds normal.  Abdominal: Soft. Bowel sounds are normal. There is no tenderness. There is no rebound and no guarding.       Obese abdomen that is nontender throughout.   Musculoskeletal: Normal range of motion.  Neurological: She is alert and oriented to person, place, and time.  Skin: Skin is warm and dry. No rash noted.  Psychiatric: She has a normal mood and affect.    ED Course  Procedures (including critical care time)  Labs  Reviewed  CBC WITH DIFFERENTIAL - Abnormal; Notable for the following:    WBC 19.4 (*)     Neutrophils Relative 90 (*)     Neutro Abs 17.4 (*)     Lymphocytes Relative 5 (*)     All other components within normal limits  COMPREHENSIVE METABOLIC PANEL  URINALYSIS, ROUTINE W REFLEX MICROSCOPIC  PREGNANCY, URINE   Results for orders placed during the hospital encounter of 04/11/12  CBC WITH DIFFERENTIAL      Component Value Range   WBC 19.4 (*) 4.0 - 10.5 K/uL   RBC 4.96  3.87 - 5.11 MIL/uL   Hemoglobin 14.8  12.0 - 15.0 g/dL   HCT 16.1  09.6 - 04.5 %   MCV 86.1  78.0 - 100.0 fL   MCH 29.8  26.0 - 34.0 pg   MCHC 34.7  30.0 - 36.0 g/dL   RDW 40.9  81.1 - 91.4 %   Platelets 333  150 - 400 K/uL   Neutrophils Relative 90 (*) 43 - 77 %   Neutro Abs 17.4 (*) 1.7 - 7.7 K/uL   Lymphocytes Relative 5 (*) 12 - 46 %   Lymphs Abs 1.0  0.7 - 4.0 K/uL   Monocytes Relative 5  3 - 12 %   Monocytes Absolute 0.9  0.1 - 1.0 K/uL   Eosinophils Relative 1  0 - 5 %   Eosinophils Absolute 0.1  0.0 - 0.7 K/uL   Basophils Relative 0  0 - 1 %   Basophils Absolute 0.0  0.0 - 0.1 K/uL  COMPREHENSIVE METABOLIC PANEL      Component Value Range   Sodium 135  135 - 145 mEq/L   Potassium 3.9  3.5 - 5.1 mEq/L   Chloride 98  96 - 112 mEq/L   CO2 24  19 - 32 mEq/L   Glucose, Bld 123 (*) 70 - 99 mg/dL   BUN 7  6 - 23 mg/dL   Creatinine, Ser 7.82  0.50 - 1.10 mg/dL   Calcium 9.7  8.4 - 95.6 mg/dL   Total Protein 7.9  6.0 - 8.3 g/dL   Albumin 3.6  3.5 - 5.2 g/dL   AST 14  0 - 37 U/L   ALT 16  0 - 35 U/L   Alkaline Phosphatase 67  39 - 117 U/L   Total Bilirubin 0.4  0.3 - 1.2 mg/dL   GFR calc non Af Amer >90  >90 mL/min   GFR calc Af Amer >90  >90 mL/min  URINALYSIS, ROUTINE W REFLEX MICROSCOPIC      Component Value Range   Color, Urine YELLOW  YELLOW   APPearance CLEAR  CLEAR   Specific Gravity, Urine 1.025  1.005 - 1.030   pH 6.0  5.0 - 8.0   Glucose, UA NEGATIVE  NEGATIVE mg/dL   Hgb urine dipstick  NEGATIVE  NEGATIVE   Bilirubin Urine NEGATIVE  NEGATIVE   Ketones, ur NEGATIVE  NEGATIVE mg/dL   Protein, ur NEGATIVE  NEGATIVE mg/dL   Urobilinogen, UA 0.2  0.0 - 1.0 mg/dL   Nitrite NEGATIVE  NEGATIVE   Leukocytes, UA NEGATIVE  NEGATIVE  PREGNANCY, URINE      Component Value Range   Preg Test, Ur NEGATIVE  NEGATIVE   Ct Abdomen Pelvis W Contrast  04/11/2012  *RADIOLOGY REPORT*  Clinical Data: Right lower quadrant pain.  CT ABDOMEN AND PELVIS WITH CONTRAST  Technique:  Multidetector CT imaging of the abdomen and pelvis was performed following the standard protocol during bolus administration of intravenous contrast.  Contrast: OMNIPAQUE IOHEXOL 300 MG/ML  SOLN  Comparison: None.  Findings: There is a focal pleural based density in the right lower lung.  There is no evidence for free intraperitoneal air.  There are small surgical clips around the distal esophagus.  Normal appearance of the liver, portal venous system and gallbladder. Normal appearance of the spleen, pancreas, adrenal glands and both kidneys.  Normal appearance of the stomach and duodenum.  There is an IUD present.  There is a 5.2 x 4.3 x 4.5 cm oval shaped low density structure just posterior to the uterus and posterior to the right adnexa.  This structure probably represents an ovarian cyst.  Normal appearance of the small and large bowel.  Normal appearance of the appendix.  No evidence for free fluid.  There are prominent external iliac lymph nodes bilaterally.  Index node on the right on sequence two, image 78 measures 1.2 cm in the short axis.  Left external iliac lymph node measures 1 cm in the short axis.  Lymph nodes throughout the inguinal regions bilaterally.  No acute bony abnormality.  IMPRESSION: 5.2 cm low density structure probably associated with the right adnexa.  This could represent a large ovarian cyst.  Based on the size of this lesion, recommend further evaluation with a pelvic ultrasound.  Prominent lymph  nodes in the external iliac stations bilaterally. These findings are nonspecific.  No evidence for free fluid.  Evidence for postsurgical changes adjacent to the distal esophagus. There may be a focal area of scarring or pleural based atelectasis in the right lower lobe.   Original Report Authenticated By: Richarda Overlie, M.D.    No results found.   No diagnosis found. 1. Nausea and vomiting 2. Leukocytosis     MDM  Nausea is controlled. She tolerated PO CM for CT scan without further vomiting. Finding on CT scan of 4 cm adnexal mass is known to the patient and currently being managed by GYN. Patient is comfortable for discharge and community follow up.        Rodena Medin, PA-C 04/11/12 1729

## 2012-04-11 NOTE — ED Notes (Signed)
Pt finished with oral contrast.  

## 2012-04-12 NOTE — ED Provider Notes (Signed)
Medical screening examination/treatment/procedure(s) were performed by non-physician practitioner and as supervising physician I was immediately available for consultation/collaboration.   Gerhard Munch, MD 04/12/12 [phone]

## 2012-08-26 ENCOUNTER — Telehealth: Payer: Self-pay | Admitting: *Deleted

## 2012-08-26 NOTE — Telephone Encounter (Signed)
LM on VM that "date requested is scheduled" and to call back on Monday for additional instructions.  VM did not confirm name or number so generic message left but wanted to let patient know surgery date.

## 2012-09-22 ENCOUNTER — Encounter: Payer: Self-pay | Admitting: Obstetrics & Gynecology

## 2012-09-22 ENCOUNTER — Ambulatory Visit (INDEPENDENT_AMBULATORY_CARE_PROVIDER_SITE_OTHER): Payer: 59 | Admitting: Obstetrics & Gynecology

## 2012-09-22 VITALS — BP 116/70 | HR 72 | Resp 16

## 2012-09-22 DIAGNOSIS — N938 Other specified abnormal uterine and vaginal bleeding: Secondary | ICD-10-CM

## 2012-09-22 DIAGNOSIS — R6889 Other general symptoms and signs: Secondary | ICD-10-CM

## 2012-09-22 DIAGNOSIS — M25559 Pain in unspecified hip: Secondary | ICD-10-CM

## 2012-09-22 DIAGNOSIS — R899 Unspecified abnormal finding in specimens from other organs, systems and tissues: Secondary | ICD-10-CM

## 2012-09-22 DIAGNOSIS — M25551 Pain in right hip: Secondary | ICD-10-CM

## 2012-09-22 DIAGNOSIS — N949 Unspecified condition associated with female genital organs and menstrual cycle: Secondary | ICD-10-CM

## 2012-09-22 DIAGNOSIS — N9489 Other specified conditions associated with female genital organs and menstrual cycle: Secondary | ICD-10-CM

## 2012-09-22 MED ORDER — IBUPROFEN 800 MG PO TABS: 800.0000 mg | ORAL_TABLET | Freq: Three times a day (TID) | ORAL | Status: DC | PRN

## 2012-09-22 MED ORDER — OXYCODONE-ACETAMINOPHEN 5-325 MG PO TABS: 2.0000 | ORAL_TABLET | ORAL | Status: DC | PRN

## 2012-09-22 MED ORDER — PROMETHAZINE HCL 25 MG PO TABS: 25.0000 mg | ORAL_TABLET | Freq: Four times a day (QID) | ORAL | Status: DC | PRN

## 2012-09-22 NOTE — Patient Instructions (Addendum)
Please call if you have any questions that arise before your surgery.

## 2012-09-26 ENCOUNTER — Encounter (HOSPITAL_COMMUNITY)
Admission: RE | Admit: 2012-09-26 | Discharge: 2012-09-26 | Disposition: A | Discharge status: Home / Self Care | Payer: 59 | Source: Ambulatory Visit | Attending: Obstetrics & Gynecology | Admitting: Obstetrics & Gynecology

## 2012-09-26 ENCOUNTER — Encounter (HOSPITAL_COMMUNITY): Payer: Self-pay

## 2012-09-26 ENCOUNTER — Encounter (HOSPITAL_COMMUNITY): Payer: Self-pay | Admitting: Pharmacy Technician

## 2012-09-26 DIAGNOSIS — Z01818 Encounter for other preprocedural examination: Secondary | ICD-10-CM | POA: Insufficient documentation

## 2012-09-26 DIAGNOSIS — Z01812 Encounter for preprocedural laboratory examination: Secondary | ICD-10-CM | POA: Insufficient documentation

## 2012-09-26 HISTORY — DX: Hypothyroidism, unspecified: E03.9

## 2012-09-26 HISTORY — DX: Gastro-esophageal reflux disease without esophagitis: K21.9

## 2012-09-26 HISTORY — DX: Transient cerebral ischemic attack, unspecified: G45.9

## 2012-09-26 HISTORY — DX: Anxiety disorder, unspecified: F41.9

## 2012-09-26 LAB — SURGICAL PCR SCREEN: MRSA, PCR: NEGATIVE

## 2012-09-26 LAB — CBC
MCH: 29.6 pg (ref 26.0–34.0)
MCV: 88.8 fL (ref 78.0–100.0)
Platelets: 330 10*3/uL (ref 150–400)
RBC: 4.83 MIL/uL (ref 3.87–5.11)
RDW: 13.8 % (ref 11.5–15.5)

## 2012-09-26 NOTE — Patient Instructions (Addendum)
Your procedure is scheduled on:10/11/12  Enter through the Main Entrance at : 6am Pick up desk phone and dial [phone] and inform us of your arrival.  Please call [phone] if you have any problems the morning of surgery.  Remember: Do not eat or drink after midnight:Monday You may brush your teeth  Take these meds the morning of surgery with a sip of water:Dexilant, Synthroid  DO NOT wear jewelry, eye make-up, lipstick,body lotion, or dark fingernail polish.  If you are to be admitted after surgery, leave suitcase in car until your room has been assigned. Patients discharged on the day of surgery will not be allowed to drive home.

## 2012-10-02 ENCOUNTER — Encounter: Payer: Self-pay | Admitting: Obstetrics & Gynecology

## 2012-10-02 NOTE — Progress Notes (Signed)
42 y.o. G1P1 DivorcedCaucasianF here to discuss upcoming surgery.  She has experiences at least six months of abdominal/pelvic pain which has been evaluated with two ultrasounds.  Pain is worse with bending and certain movements.  Ultrasound 03/02/12 showed 4.9 x 4.4 simple cyst separate from right ovary.  This was present again on 07/27/12 ultrasound showing 4.1 x 2.9 x 2.1cm ovary with 4.6cm cystic mass separate from ovary.  On ultrasound, it will tender with palpation of the vaginal probe.  In addition to this finding, patient has experienced years of heavy and irregular bleeding.  Although the bleeding is better with her Mirena IUD, it it sill irregular and unpredictable and she is sick of it.  She has decided to proceed with definitive management via laparoscopic approach.  Risks, benefits, incision locations, surgical positioning, hospital stay, pain management all discussed with patient.  Risks specifically include but are not limited to bleeding, 1% transfusion risk, DVT/PE, 3-4% risk of bowel/bladder/ureteral/vascular injury, need for possible future surgery is any abnormal pathology is present, neck nerve injury risk, infection.  Video watched.  All questions answered.    Patient has seen Dr. Loreta Ave for EGD.  On new medication and feels like this change is helping.  Did not get records.  Will try to get them.  Last Pap:  10/13 neg with neg HR HPV.  History of abnormal Pap smear back in 2007 Last MMG:  None, not indicated due to age          reports that she has been smoking Cigarettes.  She has been smoking about 1.00 pack per day. She does not have any smokeless tobacco history on file. She reports that she does not drink alcohol or use illicit drugs.   Past Surgical History  Procedure Laterality Date  . Cervical cone biopsy    . Dilation and curettage of uterus    . Lung surgery    . Y catheter      Current Outpatient Prescriptions  Medication Sig Dispense Refill  . albuterol (PROVENTIL  HFA;VENTOLIN HFA) 108 (90 BASE) MCG/ACT inhaler Inhale 2 puffs into the lungs every 6 (six) hours as needed. wheezing      . dexlansoprazole (DEXILANT) 60 MG capsule Take 60 mg by mouth daily.      [name] levothyroxine (SYNTHROID, LEVOTHROID) 50 MCG tablet Take 50 mcg by mouth daily.      . fluticasone (FLONASE) 50 MCG/ACT nasal spray Place 2 sprays into the nose daily as needed (congestion).      . ondansetron (ZOFRAN) 4 MG tablet Take 1 tablet (4 mg total) by mouth every 6 (six) hours.  12 tablet  0  . oxyCODONE-acetaminophen (PERCOCET) 5-325 MG per tablet Take 2 tablets by mouth every 4 (four) hours as needed for pain. use only as much as needed to relieve pain  30 tablet  0  . promethazine (PHENERGAN) 25 MG tablet Take 1 tablet (25 mg total) by mouth every 6 (six) hours as needed for nausea.  30 tablet  0   No current facility-administered medications for this visit.    History reviewed. No pertinent family history.  ROS:  Pertinent items are noted in HPI.  Otherwise, a comprehensive ROS was negative.  Exam:   BP 116/70  Pulse 72  Resp 16       Ht Readings from Last 3 Encounters:  04/11/12 5\' 4"  (1.626 m)    General appearance: alert, cooperative and appears stated age Head: Normocephalic, without obvious abnormality, atraumatic Neck:  no adenopathy, supple, symmetrical, trachea midline and thyroid normal to inspection and palpation Lungs: clear to auscultation bilaterally Heart: regular rate and rhythm Abdomen: soft, non-tender; bowel sounds normal; no masses,  no organomegaly Extremities: extremities normal, atraumatic, no cyanosis or edema Skin: Skin color, texture, turgor normal. No rashes or lesions Lymph nodes: Cervical, supraclavicular, and axillary nodes normal. No abnormal inguinal nodes palpated Neurologic: Grossly normal  Pelvic: External genitalia:  no lesions              Urethra:  normal appearing urethra with no masses, tenderness or lesions              Bartholins  and Skenes: normal                 Vagina: normal appearing vagina with normal color and discharge, no lesions              Cervix: no cervical motion tenderness and no lesions              Pap taken: no Bimanual Exam:  Uterus:  normal size, contour, position, consistency, mobility, non-tender              Adnexa: normal adnexa and right adnexal tenderness               Rectovaginal: Confirms               Anus:  normal sphincter tone, no lesions  A:  Persistent right adnexal mass with chronic pelvic pain for approximately 9 months, DUB with IUD, h/o abnormal Paps.  Desirous of definitive surgical treatment.  P:   Robotic assisted TLH with bilateral salpingectomy and possible right oophrectomy planned.  Will try to preserve one ovary for patient.  Rx for Motrin and Percocet given.  An After Visit Summary was printed and given to the patient.

## 2012-10-03 ENCOUNTER — Telehealth: Payer: Self-pay

## 2012-10-03 NOTE — Addendum Note (Signed)
Addended by: Jerene Bears on: 10/03/2012 03:42 PM   Modules accepted: Orders

## 2012-10-03 NOTE — Telephone Encounter (Signed)
Patient returned phone call to [facility].

## 2012-10-03 NOTE — Telephone Encounter (Signed)
5/19 [email or web address]//[email or web address] 

## 2012-10-03 NOTE — Telephone Encounter (Signed)
Message copied by Elisha Headland on Mon Oct 03, 2012  4:02 PM ------      Message from: Jerene Bears      Created: Mon Oct 03, 2012  3:40 PM       Can you call pt and have her come in for repeat CBC tomorrow if possible.  Was 13.9 on pre-op labs and I'd like to have a documented normal value before surgery. ------

## 2012-10-04 NOTE — Telephone Encounter (Signed)
Patient notified

## 2012-10-04 NOTE — Telephone Encounter (Signed)
Patient returned phone call. °

## 2012-10-05 ENCOUNTER — Other Ambulatory Visit (INDEPENDENT_AMBULATORY_CARE_PROVIDER_SITE_OTHER): Payer: 59

## 2012-10-05 DIAGNOSIS — R899 Unspecified abnormal finding in specimens from other organs, systems and tissues: Secondary | ICD-10-CM

## 2012-10-05 DIAGNOSIS — R6889 Other general symptoms and signs: Secondary | ICD-10-CM

## 2012-10-05 LAB — CBC WITH DIFFERENTIAL/PLATELET
HCT: 40.8 % (ref 36.0–46.0)
Hemoglobin: 14.2 g/dL (ref 12.0–15.0)
Lymphocytes Relative: 34 % (ref 12–46)
Lymphs Abs: 3.3 10*3/uL (ref 0.7–4.0)
MCHC: 34.8 g/dL (ref 30.0–36.0)
Monocytes Absolute: 0.7 10*3/uL (ref 0.1–1.0)
Monocytes Relative: 7 % (ref 3–12)
Neutro Abs: 5.3 10*3/uL (ref 1.7–7.7)
Neutrophils Relative %: 56 % (ref 43–77)
RBC: 4.73 MIL/uL (ref 3.87–5.11)
WBC: 9.6 10*3/uL (ref 4.0–10.5)

## 2012-10-06 ENCOUNTER — Telehealth: Payer: Self-pay

## 2012-10-06 ENCOUNTER — Ambulatory Visit: Payer: Self-pay | Admitting: Obstetrics & Gynecology

## 2012-10-06 ENCOUNTER — Other Ambulatory Visit: Payer: Self-pay | Admitting: Obstetrics & Gynecology

## 2012-10-06 DIAGNOSIS — E039 Hypothyroidism, unspecified: Secondary | ICD-10-CM

## 2012-10-06 MED ORDER — LEVOTHYROXINE SODIUM 50 MCG PO TABS: 50.0000 ug | ORAL_TABLET | Freq: Every day | ORAL | Status: DC

## 2012-10-06 NOTE — Telephone Encounter (Signed)
Message copied by Elisha Headland on Thu Oct 06, 2012  3:22 PM ------      Message from: Jerene Bears      Created: Wed Oct 05, 2012 10:05 PM       Please let pt know cbc is normal. ------

## 2012-10-06 NOTE — Telephone Encounter (Signed)
5/22 [email or web address]//[email or web address] 

## 2012-10-07 NOTE — Telephone Encounter (Signed)
Patient notified of normal results.

## 2012-10-10 MED ORDER — DEXTROSE 5 % IV SOLN
3.0000 g | INTRAVENOUS | Status: AC
  Administered 2012-10-11: 3 g via INTRAVENOUS
  Filled 2012-10-10: qty 3000

## 2012-10-10 NOTE — H&P (Signed)
Denise Ray is an 42 y.o. female G1P1 Divorced F here for definitive management of AUB that has been treated with a Mirena IUD and also for further evalution of a 5 cm cystic adnexal lesion, separate from the right ovary that has been causing pelvic pain for greater than six months.  This finding has been present for greater than six months on two separate ultrasounds.  Patient initially presented with pain in October, 2013.  She and I have discussed her irregular and heavy bleeding multiple times over the last several years.  She has never been in a situation where she had enough time for definitive surgical management.  An endometrial biopsy was done several years ago and was negative.  This was just before her Mirena IUD was placed.  The IUD has improved the heavy bleeding but she is still very irregular and her bleeding is unpredictable.  She is tired of this and desired definitive management.  Risks, benefits, incision locations, surgical positioning, hospital stay, pain management all discussed with patient at last office visit. Risks discussed specifically include but are not limited to bleeding, 1% transfusion risk, DVT/PE, 3-4% risk of bowel/bladder/ureteral/vascular injury, need for possible future surgery is any abnormal pathology is present, neck nerve injury risk, infection.  Patient is desirous to keep at least one ovary if possible.    Last Pap: 10/13 neg with neg HR HPV. History of abnormal Pap smear back in 2007  Last MMG: None, not indicated due to age    Pertinent Gynecological History: Menses: irregular Bleeding: dysfunctional uterine bleeding Contraception: IUD DES exposure: denies Blood transfusions: none Sexually transmitted diseases: hpv Previous GYN Procedures: conization to cervix  Last mammogram: n/a due to age Date: n/a Last pap: normal Date: 10/13 with neg HR HPV OB History:  G1, P1   Past Medical History  Diagnosis Date  . History of abnormal cervical Pap smear    . Hypothyroidism        . Asthma     seasonal- rare inhaler use  . Anxiety     no meds  . Depression     no meds  . TIA (transient ischemic attack)     from BCP  . GERD (gastroesophageal reflux disease)     Past Surgical History  Procedure Laterality Date  . Conization of cervix  2000  . Hysteroscopy w/d&c  4/09  . Chest wall/lung mass resection  7/03  . Y catheter      No family history on file.  Social History:  reports that she has been smoking Cigarettes.  She has been smoking about 1.00 pack per day. She does not have any smokeless tobacco history on file. She reports that she does not drink alcohol or use illicit drugs.  Allergies:  Allergies  Allergen Reactions  . Estrogens Other (See Comments)    Stroke symptoms  . Tamiflu Cough    No prescriptions prior to admission    Review of Systems  Constitutional: Negative for fever and chills.  Respiratory: Positive for cough. Negative for hemoptysis, sputum production, shortness of breath and wheezing.   Cardiovascular: Negative for chest pain and palpitations.  Gastrointestinal: Negative for heartburn and nausea.  Genitourinary: Negative for dysuria and urgency.  Skin: Negative for rash.  Neurological: Negative for headaches.  Endo/Heme/Allergies: Does not bruise/bleed easily.    There were no vitals taken for this visit. Physical Exam  Vitals reviewed. Constitutional: She appears well-developed and well-nourished.  HENT:  Head: Normocephalic and atraumatic.  Neck: Normal range of motion. Neck supple.  Cardiovascular: Normal rate and regular rhythm.   Respiratory: Effort normal and breath sounds normal.  GI: Soft. Bowel sounds are normal.    No results found for this or any previous visit (from the past 24 hour(s)).  No results found.  Assessment/Plan: 42 yo G1P1 DWF with AUB and chronic pelvic pain with a 5cm right adnexal mass noted on ultrasound here for definitive management of these issues.   Robotic assisted TLH with possible BSO planned.  All questions answered.  Patient ready to proceed.  Valentina Shaggy SUZANNE 10/10/2012, 9:07 PM

## 2012-10-11 ENCOUNTER — Ambulatory Visit (HOSPITAL_COMMUNITY): Payer: 59 | Admitting: Anesthesiology

## 2012-10-11 ENCOUNTER — Encounter (HOSPITAL_COMMUNITY): Payer: Self-pay | Admitting: Anesthesiology

## 2012-10-11 ENCOUNTER — Ambulatory Visit (HOSPITAL_COMMUNITY)
Admission: RE | Admit: 2012-10-11 | Discharge: 2012-10-11 | Disposition: A | Discharge status: Home / Self Care | Payer: 59 | Source: Ambulatory Visit | Attending: Obstetrics & Gynecology | Admitting: Obstetrics & Gynecology

## 2012-10-11 ENCOUNTER — Encounter (HOSPITAL_COMMUNITY)
Admission: RE | Disposition: A | Discharge status: Home / Self Care | Payer: Self-pay | Source: Ambulatory Visit | Attending: Obstetrics & Gynecology

## 2012-10-11 DIAGNOSIS — N949 Unspecified condition associated with female genital organs and menstrual cycle: Secondary | ICD-10-CM | POA: Insufficient documentation

## 2012-10-11 DIAGNOSIS — N920 Excessive and frequent menstruation with regular cycle: Secondary | ICD-10-CM

## 2012-10-11 DIAGNOSIS — N83209 Unspecified ovarian cyst, unspecified side: Secondary | ICD-10-CM

## 2012-10-11 DIAGNOSIS — Z30432 Encounter for removal of intrauterine contraceptive device: Secondary | ICD-10-CM | POA: Insufficient documentation

## 2012-10-11 DIAGNOSIS — N938 Other specified abnormal uterine and vaginal bleeding: Secondary | ICD-10-CM | POA: Insufficient documentation

## 2012-10-11 DIAGNOSIS — N838 Other noninflammatory disorders of ovary, fallopian tube and broad ligament: Secondary | ICD-10-CM | POA: Insufficient documentation

## 2012-10-11 DIAGNOSIS — N9489 Other specified conditions associated with female genital organs and menstrual cycle: Secondary | ICD-10-CM

## 2012-10-11 HISTORY — PX: ROBOTIC ASSISTED TOTAL HYSTERECTOMY: SHX6085

## 2012-10-11 HISTORY — PX: SALPINGOOPHORECTOMY: SHX82

## 2012-10-11 SURGERY — ROBOTIC ASSISTED TOTAL HYSTERECTOMY
Anesthesia: General | Wound class: Clean Contaminated

## 2012-10-11 MED ORDER — TEMAZEPAM 15 MG PO CAPS: 15.0000 mg | ORAL_CAPSULE | Freq: Every evening | ORAL | Status: DC | PRN

## 2012-10-11 MED ORDER — ONDANSETRON HCL 4 MG/2ML IJ SOLN: 4.0000 mg | Freq: Once | INTRAMUSCULAR | Status: DC | PRN

## 2012-10-11 MED ORDER — LACTATED RINGERS IR SOLN
Status: DC | PRN
  Administered 2012-10-11: 3000 mL

## 2012-10-11 MED ORDER — HYDROMORPHONE HCL PF 1 MG/ML IJ SOLN
INTRAMUSCULAR | Status: AC
  Filled 2012-10-11: qty 1

## 2012-10-11 MED ORDER — LACTATED RINGERS IV SOLN
INTRAVENOUS | Status: DC
  Administered 2012-10-11 (×2): via INTRAVENOUS

## 2012-10-11 MED ORDER — LIDOCAINE HCL (CARDIAC) 20 MG/ML IV SOLN
INTRAVENOUS | Status: DC | PRN
  Administered 2012-10-11: 50 mg via INTRAVENOUS

## 2012-10-11 MED ORDER — KETOROLAC TROMETHAMINE 30 MG/ML IJ SOLN
INTRAMUSCULAR | Status: AC
  Filled 2012-10-11: qty 1

## 2012-10-11 MED ORDER — ONDANSETRON HCL 4 MG/2ML IJ SOLN
INTRAMUSCULAR | Status: AC
  Filled 2012-10-11: qty 2

## 2012-10-11 MED ORDER — FENTANYL CITRATE 0.05 MG/ML IJ SOLN
INTRAMUSCULAR | Status: AC
  Filled 2012-10-11: qty 5

## 2012-10-11 MED ORDER — GLYCOPYRROLATE 0.2 MG/ML IJ SOLN
INTRAMUSCULAR | Status: AC
  Filled 2012-10-11: qty 2

## 2012-10-11 MED ORDER — DEXTROSE-NACL 5-0.45 % IV SOLN
INTRAVENOUS | Status: DC
  Administered 2012-10-11: 16:00:00 via INTRAVENOUS

## 2012-10-11 MED ORDER — LEVOTHYROXINE SODIUM 50 MCG PO TABS
50.0000 ug | ORAL_TABLET | ORAL | Status: DC
  Filled 2012-10-11: qty 1

## 2012-10-11 MED ORDER — ACETAMINOPHEN 10 MG/ML IV SOLN
INTRAVENOUS | Status: AC
  Filled 2012-10-11: qty 100

## 2012-10-11 MED ORDER — KETOROLAC TROMETHAMINE 30 MG/ML IJ SOLN
15.0000 mg | Freq: Once | INTRAMUSCULAR | Status: AC | PRN
  Administered 2012-10-11: 30 mg via INTRAVENOUS

## 2012-10-11 MED ORDER — PROPOFOL 10 MG/ML IV EMUL
INTRAVENOUS | Status: AC
  Filled 2012-10-11: qty 20

## 2012-10-11 MED ORDER — FENTANYL CITRATE 0.05 MG/ML IJ SOLN
INTRAMUSCULAR | Status: AC
  Administered 2012-10-11: 25 ug via INTRAVENOUS
  Filled 2012-10-11: qty 2

## 2012-10-11 MED ORDER — FLUTICASONE PROPIONATE 50 MCG/ACT NA SUSP: 2.0000 | Freq: Every day | NASAL | Status: DC

## 2012-10-11 MED ORDER — ROPIVACAINE HCL 5 MG/ML IJ SOLN
INTRAMUSCULAR | Status: AC
  Filled 2012-10-11: qty 60

## 2012-10-11 MED ORDER — PANTOPRAZOLE SODIUM 40 MG IV SOLR
40.0000 mg | Freq: Every day | INTRAVENOUS | Status: DC
  Filled 2012-10-11 (×2): qty 40

## 2012-10-11 MED ORDER — ROCURONIUM BROMIDE 50 MG/5ML IV SOLN
INTRAVENOUS | Status: AC
  Filled 2012-10-11: qty 1

## 2012-10-11 MED ORDER — PROMETHAZINE HCL 25 MG/ML IJ SOLN: 12.5000 mg | INTRAMUSCULAR | Status: DC | PRN

## 2012-10-11 MED ORDER — IBUPROFEN 800 MG PO TABS: 800.0000 mg | ORAL_TABLET | Freq: Three times a day (TID) | ORAL | Status: DC | PRN

## 2012-10-11 MED ORDER — MIDAZOLAM HCL 5 MG/5ML IJ SOLN
INTRAMUSCULAR | Status: DC | PRN
  Administered 2012-10-11: 2 mg via INTRAVENOUS

## 2012-10-11 MED ORDER — DEXAMETHASONE SODIUM PHOSPHATE 10 MG/ML IJ SOLN
INTRAMUSCULAR | Status: AC
  Filled 2012-10-11: qty 1

## 2012-10-11 MED ORDER — MIDAZOLAM HCL 2 MG/2ML IJ SOLN
INTRAMUSCULAR | Status: AC
  Filled 2012-10-11: qty 2

## 2012-10-11 MED ORDER — ALBUTEROL SULFATE HFA 108 (90 BASE) MCG/ACT IN AERS: 2.0000 | INHALATION_SPRAY | Freq: Four times a day (QID) | RESPIRATORY_TRACT | Status: DC | PRN

## 2012-10-11 MED ORDER — ACETAMINOPHEN 10 MG/ML IV SOLN
1000.0000 mg | Freq: Once | INTRAVENOUS | Status: AC
  Administered 2012-10-11: 1000 mg via INTRAVENOUS

## 2012-10-11 MED ORDER — NEOSTIGMINE METHYLSULFATE 1 MG/ML IJ SOLN
INTRAMUSCULAR | Status: DC | PRN
  Administered 2012-10-11: 4 mg via INTRAVENOUS

## 2012-10-11 MED ORDER — ROCURONIUM BROMIDE 100 MG/10ML IV SOLN
INTRAVENOUS | Status: DC | PRN
  Administered 2012-10-11: 10 mg via INTRAVENOUS
  Administered 2012-10-11: 20 mg via INTRAVENOUS
  Administered 2012-10-11: 60 mg via INTRAVENOUS

## 2012-10-11 MED ORDER — FENTANYL CITRATE 0.05 MG/ML IJ SOLN
25.0000 ug | INTRAMUSCULAR | Status: DC | PRN
  Administered 2012-10-11: 50 ug via INTRAVENOUS
  Administered 2012-10-11: 25 ug via INTRAVENOUS

## 2012-10-11 MED ORDER — PROPOFOL INFUSION 10 MG/ML OPTIME
INTRAVENOUS | Status: DC | PRN
  Administered 2012-10-11: 20 mL via INTRAVENOUS

## 2012-10-11 MED ORDER — KETOROLAC TROMETHAMINE 30 MG/ML IJ SOLN
30.0000 mg | Freq: Four times a day (QID) | INTRAMUSCULAR | Status: DC
  Administered 2012-10-11: 30 mg via INTRAVENOUS
  Filled 2012-10-11: qty 1

## 2012-10-11 MED ORDER — GLYCOPYRROLATE 0.2 MG/ML IJ SOLN
INTRAMUSCULAR | Status: DC | PRN
  Administered 2012-10-11: 0.6 mg via INTRAVENOUS
  Administered 2012-10-11: 0.2 mg via INTRAVENOUS

## 2012-10-11 MED ORDER — MORPHINE SULFATE 4 MG/ML IJ SOLN
2.0000 mg | INTRAMUSCULAR | Status: DC | PRN
  Administered 2012-10-11 (×3): 2 mg via INTRAVENOUS
  Filled 2012-10-11 (×3): qty 1

## 2012-10-11 MED ORDER — FENTANYL CITRATE 0.05 MG/ML IJ SOLN
INTRAMUSCULAR | Status: DC | PRN
  Administered 2012-10-11 (×3): 100 ug via INTRAVENOUS
  Administered 2012-10-11 (×2): 50 ug via INTRAVENOUS

## 2012-10-11 MED ORDER — DEXAMETHASONE SODIUM PHOSPHATE 4 MG/ML IJ SOLN
INTRAMUSCULAR | Status: DC | PRN
  Administered 2012-10-11: 8 mg via INTRAVENOUS

## 2012-10-11 MED ORDER — OXYCODONE-ACETAMINOPHEN 5-325 MG PO TABS
2.0000 | ORAL_TABLET | ORAL | Status: DC | PRN
  Administered 2012-10-11: 2 via ORAL
  Filled 2012-10-11: qty 2

## 2012-10-11 MED ORDER — ROPIVACAINE HCL 5 MG/ML IJ SOLN
INTRAMUSCULAR | Status: DC | PRN
  Administered 2012-10-11: 80 mL via EPIDURAL

## 2012-10-11 MED ORDER — KETOROLAC TROMETHAMINE 30 MG/ML IJ SOLN: 30.0000 mg | Freq: Four times a day (QID) | INTRAMUSCULAR | Status: DC

## 2012-10-11 MED ORDER — NEOSTIGMINE METHYLSULFATE 1 MG/ML IJ SOLN
INTRAMUSCULAR | Status: AC
  Filled 2012-10-11: qty 1

## 2012-10-11 MED ORDER — MENTHOL 3 MG MT LOZG
1.0000 | LOZENGE | OROMUCOSAL | Status: DC | PRN
  Filled 2012-10-11: qty 9

## 2012-10-11 MED ORDER — ARTIFICIAL TEARS OP OINT
TOPICAL_OINTMENT | OPHTHALMIC | Status: AC
  Filled 2012-10-11: qty 3.5

## 2012-10-11 MED ORDER — ALUM & MAG HYDROXIDE-SIMETH 200-200-20 MG/5ML PO SUSP
30.0000 mL | ORAL | Status: DC | PRN
  Filled 2012-10-11: qty 30

## 2012-10-11 MED ORDER — LIDOCAINE HCL (CARDIAC) 20 MG/ML IV SOLN
INTRAVENOUS | Status: AC
  Filled 2012-10-11: qty 5

## 2012-10-11 MED ORDER — MEPERIDINE HCL 25 MG/ML IJ SOLN: 6.2500 mg | INTRAMUSCULAR | Status: DC | PRN

## 2012-10-11 MED ORDER — HYDROMORPHONE HCL PF 1 MG/ML IJ SOLN
INTRAMUSCULAR | Status: DC | PRN
  Administered 2012-10-11: 1 mg via INTRAVENOUS

## 2012-10-11 MED ORDER — SIMETHICONE 80 MG PO CHEW: 80.0000 mg | CHEWABLE_TABLET | Freq: Four times a day (QID) | ORAL | Status: DC | PRN

## 2012-10-11 MED ORDER — ONDANSETRON HCL 4 MG/2ML IJ SOLN
INTRAMUSCULAR | Status: DC | PRN
  Administered 2012-10-11: 4 mg via INTRAVENOUS

## 2012-10-11 SURGICAL SUPPLY — 61 items
ADH SKN CLS APL DERMABOND .7 (GAUZE/BANDAGES/DRESSINGS) ×2
APL SKNCLS STERI-STRIP NONHPOA (GAUZE/BANDAGES/DRESSINGS) ×1
BARRIER ADHS 3X4 INTERCEED (GAUZE/BANDAGES/DRESSINGS) ×3 IMPLANT
BENZOIN TINCTURE PRP APPL 2/3 (GAUZE/BANDAGES/DRESSINGS) ×3 IMPLANT
BRR ADH 4X3 ABS CNTRL BYND (GAUZE/BANDAGES/DRESSINGS) ×1
CHLORAPREP W/TINT 26ML (MISCELLANEOUS) ×3 IMPLANT
CLOTH BEACON ORANGE TIMEOUT ST (SAFETY) ×3 IMPLANT
CONT PATH 16OZ SNAP LID 3702 (MISCELLANEOUS) ×3 IMPLANT
COVER MAYO STAND STRL (DRAPES) ×3 IMPLANT
COVER TABLE BACK 60X90 (DRAPES) ×6 IMPLANT
COVER TIP SHEARS 8 DVNC (MISCELLANEOUS) ×2 IMPLANT
COVER TIP SHEARS 8MM DA VINCI (MISCELLANEOUS) ×1
DECANTER SPIKE VIAL GLASS SM (MISCELLANEOUS) ×3 IMPLANT
DERMABOND ADVANCED (GAUZE/BANDAGES/DRESSINGS) ×1
DERMABOND ADVANCED .7 DNX12 (GAUZE/BANDAGES/DRESSINGS) ×2 IMPLANT
DRAPE HUG U DISPOSABLE (DRAPE) ×3 IMPLANT
DRAPE LG THREE QUARTER DISP (DRAPES) ×6 IMPLANT
DRAPE WARM FLUID 44X44 (DRAPE) ×3 IMPLANT
ELECT REM PT RETURN 9FT ADLT (ELECTROSURGICAL) ×3
ELECTRODE REM PT RTRN 9FT ADLT (ELECTROSURGICAL) ×2 IMPLANT
EVACUATOR SMOKE 8.L (FILTER) ×3 IMPLANT
GAUZE VASELINE 3X9 (GAUZE/BANDAGES/DRESSINGS) IMPLANT
GLOVE BIOGEL PI IND STRL 7.0 (GLOVE) ×4 IMPLANT
GLOVE BIOGEL PI INDICATOR 7.0 (GLOVE) ×2
GLOVE ECLIPSE 6.5 STRL STRAW (GLOVE) ×12 IMPLANT
GOWN STRL REIN XL XLG (GOWN DISPOSABLE) ×18 IMPLANT
KIT ACCESSORY DA VINCI DISP (KITS) ×1
KIT ACCESSORY DVNC DISP (KITS) ×2 IMPLANT
LEGGING LITHOTOMY PAIR STRL (DRAPES) ×3 IMPLANT
NEEDLE INSUFFLATION 120MM (ENDOMECHANICALS) ×3 IMPLANT
OCCLUDER COLPOPNEUMO (BALLOONS) IMPLANT
PACK LAVH (CUSTOM PROCEDURE TRAY) ×3 IMPLANT
PAD PREP 24X48 CUFFED NSTRL (MISCELLANEOUS) ×6 IMPLANT
PROTECTOR NERVE ULNAR (MISCELLANEOUS) ×6 IMPLANT
SCISSORS LAP 5X35 DISP (ENDOMECHANICALS) ×3 IMPLANT
SET CYSTO W/LG BORE CLAMP LF (SET/KITS/TRAYS/PACK) ×6 IMPLANT
SET IRRIG TUBING LAPAROSCOPIC (IRRIGATION / IRRIGATOR) ×3 IMPLANT
SOLUTION ELECTROLUBE (MISCELLANEOUS) ×3 IMPLANT
STRIP CLOSURE SKIN 1/4X4 (GAUZE/BANDAGES/DRESSINGS) ×3 IMPLANT
SUT VIC AB 0 CT1 27 (SUTURE) ×15
SUT VIC AB 0 CT1 27XBRD ANBCTR (SUTURE) ×10 IMPLANT
SUT VICRYL 0 UR6 27IN ABS (SUTURE) ×3 IMPLANT
SUT VICRYL RAPIDE 4/0 PS 2 (SUTURE) ×6 IMPLANT
SUT VLOC 180 0 9IN  GS21 (SUTURE) ×1
SUT VLOC 180 0 9IN GS21 (SUTURE) ×2 IMPLANT
SYR 50ML LL SCALE MARK (SYRINGE) ×3 IMPLANT
SYSTEM CONVERTIBLE TROCAR (TROCAR) IMPLANT
TIP RUMI ORANGE 6.7MMX12CM (TIP) IMPLANT
TIP UTERINE 5.1X6CM LAV DISP (MISCELLANEOUS) IMPLANT
TIP UTERINE 6.7X10CM GRN DISP (MISCELLANEOUS) IMPLANT
TIP UTERINE 6.7X6CM WHT DISP (MISCELLANEOUS) IMPLANT
TIP UTERINE 6.7X8CM BLUE DISP (MISCELLANEOUS) ×3 IMPLANT
TOWEL OR 17X24 6PK STRL BLUE (TOWEL DISPOSABLE) ×6 IMPLANT
TRAY FOLEY BAG SILVER LF 14FR (CATHETERS) ×3 IMPLANT
TROCAR DILATING TIP 12MM 150MM (ENDOMECHANICALS) ×3 IMPLANT
TROCAR DISP BLADELESS 8 DVNC (TROCAR) ×2 IMPLANT
TROCAR DISP BLADELESS 8MM (TROCAR) ×1
TROCAR XCEL NON-BLD 5MMX100MML (ENDOMECHANICALS) ×3 IMPLANT
TUBING FILTER THERMOFLATOR (ELECTROSURGICAL) ×3 IMPLANT
WARMER LAPAROSCOPE (MISCELLANEOUS) ×3 IMPLANT
WATER STERILE IRR 1000ML POUR (IV SOLUTION) ×9 IMPLANT

## 2012-10-11 NOTE — Progress Notes (Signed)
Day of Surgery Procedure(s) (LRB): ROBOTIC ASSISTED TOTAL HYSTERECTOMY (N/A) SALPINGO OOPHORECTOMY (Bilateral)  Subjective: Patient reports tolerating PO and no problems voiding.  She wants to go HOME!  Has been able to void without difficulty.  Tolerated regular diet.  She has excellent pain control with Percocet.  She has ambulated in the halls without difficulty.  Objective: I have reviewed patient's vital signs, intake and output, medications and labs.  General: alert and cooperative Resp: clear to auscultation bilaterally Cardio: regular rate and rhythm, S1, S2 normal, no murmur, click, rub or gallop GI: soft, non-tender; bowel sounds normal; no masses,  no organomegaly and incision: clean, dry and intact Extremities: extremities normal, atraumatic, no cyanosis or edema Vaginal Bleeding: none  Assessment: s/p Procedure(s): ROBOTIC ASSISTED TOTAL HYSTERECTOMY (N/A) SALPINGO OOPHORECTOMY (Bilateral): stable, progressing well and tolerating diet  Plan: Discharge home  LOS: 0 days    Denise Ray 10/11/2012, 6:43 PM

## 2012-10-11 NOTE — Anesthesia Postprocedure Evaluation (Signed)
  Anesthesia Post-op Note  Patient: Denise Ray  Procedure(s) Performed: Procedure(s): ROBOTIC ASSISTED TOTAL HYSTERECTOMY (N/A) SALPINGO OOPHORECTOMY (Bilateral)  Patient is awake and responsive. Pain and nausea are reasonably well controlled. Vital signs are stable and clinically acceptable. Oxygen saturation is clinically acceptable. There are no apparent anesthetic complications at this time. Patient is ready for discharge.

## 2012-10-11 NOTE — Progress Notes (Signed)
Pt discharged to home accompanied by her mother.  Pt left unit in wheelchair. Teaching complete.

## 2012-10-11 NOTE — Op Note (Signed)
10/11/2012  10:15 AM  PATIENT:  Denise Ray  42 y.o. 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  PROCEDURE:  Procedure(s): ROBOTIC ASSISTED TOTAL HYSTERECTOMY BILATERAL SALPINGECTOMY  SURGEON:  Shalla Bulluck SUZANNE  ASSISTANTS: ROMINE, CYNTHIA   ANESTHESIA:   general  ESTIMATED BLOOD LOSS: 50cc  BLOOD ADMINISTERED:none   FLUIDS: 1400cc LR  UOP: 100cc, concentrated  SPECIMEN:  Uterus, cervix, bilateral tubes  DISPOSITION OF SPECIMEN:  PATHOLOGY  FINDINGS: approximately 5cm right tubal cyst pulling fallopian tube down into posterior cul de sac, normal ovaries, normal appearing uterus with scarring along superior portion of bladder/perioneum from prior Cesarean section, adhesions of left colon to left sidewall obscuring visualization of left IP ligament  DESCRIPTION OF OPERATION: Patient is taken to the operating room. She is placed in the supine position. She is a running IV in place. Informed consent was present on the chart. SCDs on her lower extremities and functioning properly. General endotracheal anesthesia was administered by the anesthesia staff without difficulty. Dr. Cristela Blue oversaw case. Once adequate anesthesia was confirmed the legs are placed in the low lithotomy position in Mora stirrups. The patient was already on a beanbag. Her arms were tucked by the side. The beanbag was inflated to ensure that there would be no movement during the Trendelenburg placement.  Chlor prep was then used to prep the abdomen and Betadine was used to prep the inner thighs, perineum and vagina. Once 3  minutes had past the patient was draped in a normal standard fashion. The legs were lifted to the high lithotomy position. The cervix was visualized by placing a heavy weighted speculum in the posterior aspect of the vagina and using a curved Deaver retractor to the retract anteriorly. The anterior lip of the cervix was grasped with single-tooth tenaculum. The patient did have an IUD in place the strings were visualized and the IUD was removed without difficulty. It was discarded. The cervix sounded to 9/2 cm. Pratt dilators were used to dilate the cervix up to a #21. A RUMI uterine manipulator was obtained. A #8 disposable tip was placed on the RUMI manipulator as well as a medium KOH ring. This was passed through the cervix and the bulb of the disposable tip was inflated with 10 cc of normal saline. There was a good fit of the KOH ring around the cervix. The tenaculum was removed. There is also good manipulation of the uterus. The speculum and retractor were removed as well. A Foley catheter was placed to straight drain. The concentrated urine was noted. Legs were lowered to the low lithotomy position and attention was turned the abdomen.  Ropivacaine mixture (0.5% mixed one-to-one with normal saline) was used anesthetize the skin above the umbilicus.  A Veress needle was obtained.   The abdomen was elevated and the needle was passed directly into the abdomen. The peritoneum was felt as a pop as it was passed with the needle. A syringe of normal saline was attached the needle and aspiration was performed. No blood or fluid was noted. Fluid was injected without difficulty and a second aspiration was performed. No fluid or blood or saline was noted. Fluid dripped easily into the  needle. Low flow CO2 gas was attached the needle and the pneumoperitoneum was achieved without difficulty. Once for liters of gas was in the abdomen the Veress needle was removed and a 12 millimeter port bladed trocar were passed directly  to the abdomen. The laparoscope was then used to confirm intraperitoneal placement. There were some omental adhesions around to the umbilicus that works curetting some of the view of the pelvis. The upper abdomen could be surveyed without difficulty. Locations for the 1 and #2 arm ports could also be visualized. The skin was transilluminated and the skin was anesthetized with ropivacaine mixture. 8mm skin incisions were made about 10 cm lateral to the umbilicus on each side. Then 8mm nondisposable trocar ports were passed directly into the abdomen. Also on the right lower quadrant a 5 mm skin incision was made after anesthetize the skin with the ropivacaine mixture. A 5 mm non-bladed trocar port were also passed directly into the abdomen. All trochars were removed.  The table was placed on the floor and the patient was placed in steep Trendelenburg.  The robot was docked in a normal standard fashion to the left of the table. In the #1 arm was placed endoscopic scissors with monopolar cautery attached and then the #2 arm was placed PK [place] with bipolar cautery attached. The systems are right side of the table for the right lower quadrant incision was located.  At this point and 8mm scope was used to visualize the adhesions from the left side and endoscopic scissor with monopolar cautery attached was brought into the right port under direct visualization of the laparoscope these adhesions were taken down sharply and using monopolar cautery when necessary. Once these adhesions were off away visualization of the pelvis was very easy. The right tube had a 5 cm smooth mobile cyst that was port and the tube down to the pelvis.  Both ovaries were otherwise normal.  The uterus is normal. There was scarring of the peritoneum where the prior bladder flap was made with prior cesarean section.  Attention was turned to the right side. With uterus on stretch the right tube was excised off the ovary and mesosalpinx was  dissected to free the tube. Then the right utero-ovarian pedicle was serially clamped cauterized and incised. Right round ligament was serially clamped cauterized and incised. The anterior and posterior peritoneum of the inferior leaf of the broad ligament were opened. The scar from prior cesarean section was dissected sharply until the bladder flap could be created. The bladder was taken down below the level of the KOH ring. The right uterine artery skeletonized and then just superior to the KOH ring this vessel was serially clamped, cauterized, and incised.  Attention was turned the left side  the call was taken off the sidewall with sharp dissection.  Then the tube was excised off the ovary using sharp dissection a bipolar cautery.  The mesosalpinx was incised freeing the tube. Then the left uterine ovarian pedicle was serially clamped cauterized and incised. Next the left round ligament was serially clamped cauterized and incised. The anterior posterior peritoneum of the inferiorly for the broad ligament were opened. The anterior peritoneum was carried across to the dissection on the right side. The remainder of the bladder flap was created using sharp dissection. The bladder was well below the level of the KOH ring. The left uterine artery skeletonized. Then the left uterine artery, above the level of the KOH ring, was serially clamped cauterized and incised. The uterus  was devascularized at this point.  The colpotomy was performed a starting in the midline and using monopolar cautery with an open edge of the scissors. This was carried around a circumferential fashion until the vaginal mucosa was completely incised in the specimen was freed.  The specimen was then delivered to the vagina.  A vaginal occlusive device was used to maintain the pneumoperitoneum  Instruments were changed with a needle cut suture driver placed in arm 1 and a Cobra grasper placed #2. A V. lock suture was passed through the  middle port. Starting in the right angle, the cuff was closed incorporating the anterior and posterior vaginal mucosa in each stitch. This was carried across all the way to the left corner and a running fashion. To stitches were brought back towards the midline and the suture was cut flush with the vagina. The needle was brought out the pelvis. The pelvis was irrigated. All pedicles were inspected. No bleeding was noted. In Interceed was placed across vaginal cuff. Ureters were noted deep in the pelvis to be peristalsing.  At this point the procedure was completed. The instruments were removed. The robot was undocked. The patient was taken out of Trendelenburg positioning. The ports were removed under direct vision shove laparoscope and the pneumoperitoneum was relieved. Several deep breaths were given to the patient's trying to any gas the abdomen and finally the midline port was removed.  The midline port was closed at the fascial level with figure-of-eight suture of #0 Vicryl. The skin was then closed with subcuticular stitches of 3-0 Vicryl. The skin was cleansed Dermabond was applied. Attention was then turned the vagina and the cuff was inspected. No bleeding was noted. The anterior posterior vaginal assistant incorporated in each stitch. The Foley catheter was left in place. Sponge, lap, needle, initially counts were correct x2. Patient tolerated the procedure very well. She was awakened from anesthesia, extubated and taken to recovery in stable condition.   COUNTS:  YES  PLAN OF CARE: Transfer to PACU

## 2012-10-11 NOTE — Anesthesia Procedure Notes (Signed)
Procedure Name: Intubation Date/Time: 10/11/2012 7:31 AM Performed by: Shanon Payor Pre-anesthesia Checklist: Patient identified, Emergency Drugs available, Suction available, Timeout performed and Patient being monitored Patient Re-evaluated:Patient Re-evaluated prior to inductionOxygen Delivery Method: Circle system utilized Preoxygenation: Pre-oxygenation with 100% oxygen Intubation Type: IV induction Ventilation: Mask ventilation without difficulty Grade View: Grade I Tube type: Oral Tube size: 7.0 mm Number of attempts: 1 Airway Equipment and Method: Stylet and Video-laryngoscopy Placement Confirmation: ETT inserted through vocal cords under direct vision,  positive ETCO2 and breath sounds checked- equal and bilateral Secured at: 22 cm Tube secured with: Tape Dental Injury: Teeth and Oropharynx as per pre-operative assessment  Difficulty Due To: Difficulty was anticipated, Difficult Airway- due to anterior larynx and Difficult Airway- due to limited oral opening

## 2012-10-11 NOTE — Anesthesia Postprocedure Evaluation (Signed)
Anesthesia Post Note  Patient: Denise Ray  Procedure(s) Performed: Procedure(s) (LRB): ROBOTIC ASSISTED TOTAL HYSTERECTOMY (N/A) SALPINGO OOPHORECTOMY (Bilateral)  Anesthesia type: General  Patient location: [facility]  Post pain: Pain level controlled  Post assessment: Post-op Vital signs reviewed  Last Vitals:  Filed Vitals:   10/11/12 1316  BP: 112/71  Pulse: 89  Temp: 36.5 C  Resp: 20    Post vital signs: Reviewed  Level of consciousness: sedated  Complications: No apparent anesthesia complications

## 2012-10-11 NOTE — Anesthesia Preprocedure Evaluation (Signed)
Anesthesia Evaluation  Patient identified by MRN, date of birth, ID band Patient awake    Reviewed: Allergy & Precautions, H&P , NPO status , Patient's Chart, lab work & pertinent test results  Airway Mallampati: III TM Distance: >3 FB Neck ROM: full    Dental no notable dental hx. (+) Teeth Intact   Pulmonary asthma ,    Pulmonary exam normal       Cardiovascular negative cardio ROS      Neuro/Psych negative neurological ROS  negative psych ROS   GI/Hepatic Neg liver ROS,   Endo/Other  Hypothyroidism Morbid obesity  Renal/GU negative Renal ROS  negative genitourinary   Musculoskeletal negative musculoskeletal ROS (+)   Abdominal (+) + obese,   Peds negative pediatric ROS (+)  Hematology negative hematology ROS (+)   Anesthesia Other Findings   Reproductive/Obstetrics negative OB ROS                           Anesthesia Physical Anesthesia Plan  ASA: III  Anesthesia Plan: General   Post-op Pain Management:    Induction: Intravenous  Airway Management Planned: Oral ETT  Additional Equipment:   Intra-op Plan:   Post-operative Plan: Extubation in OR  Informed Consent: I have reviewed the patients History and Physical, chart, labs and discussed the procedure including the risks, benefits and alternatives for the proposed anesthesia with the patient or authorized representative who has indicated his/her understanding and acceptance.   Dental Advisory Given  Plan Discussed with: CRNA and Surgeon  Anesthesia Plan Comments:         Anesthesia Quick Evaluation

## 2012-10-11 NOTE — Transfer of Care (Signed)
Immediate Anesthesia Transfer of Care Note  Patient: Denise Ray  Procedure(s) Performed: Procedure(s): ROBOTIC ASSISTED TOTAL HYSTERECTOMY (N/A) SALPINGO OOPHORECTOMY (Bilateral)  Patient Location: PACU  Anesthesia Type:General  Level of Consciousness: awake, alert  and oriented  Airway & Oxygen Therapy: Patient Spontanous Breathing and Patient connected to nasal cannula oxygen  Post-op Assessment: Report given to PACU RN and Post -op Vital signs reviewed and stable  Post vital signs: Reviewed and stable  Complications: No apparent anesthesia complications

## 2012-10-12 ENCOUNTER — Encounter (HOSPITAL_COMMUNITY): Payer: Self-pay | Admitting: Obstetrics & Gynecology

## 2012-10-12 NOTE — Discharge Summary (Signed)
Physician Discharge Summary  Patient ID: Denise Ray MRN: [id number] DOB/AGE: 1970-08-23 42 y.o.  Admit date: 10/11/2012 Discharge date: 10/12/2012  Admission Diagnoses:  Abnormal uterine bleeding, failed Mirena use, 5cm right adnexal mass, chronic pelvic pain, remote hx of abnormal Pap smears, H/o conization of cervix, H/O cesarean section  Discharge Diagnoses:  Active Problems:   * No active hospital problems. *   Discharged Condition: good  Hospital Course: Admitted through same day surgery.  Robotic assisted TLH with bilateral salpingectomy performed.  Patient had a smooth 5cm right tubal cyst noted at time of surgery.  She also had omental adhesions to just beneath the umbilicus and adhesions of the colon to the left sidewall.  Surgery went uneventfully.  EBL 50cc.  She did have very concentrated urine at the time of catheter insertion so the catheter was left in place post operatively until her urine started to clear.  This occurred in the PACU.  After he PACU stay, she was transferred to the third floor.  She was able to ambulate, void without difficulty, tolerate regular diet, and had no nausea.  Her pain was initially treated with IV morphine but as soon as she tolerated regular diet, she was changed to oral Percocet which worked much better for her.  She was seen around 6:30pm on POD#0.  She was requesting discharge.  Her physical exam was normal.  She had met all criteria for discharge.  Her 6-hour, post-op hemoglobin was 13.7.  Consults: None  Significant Diagnostic Studies: labs: post op hb 13.7  Treatments: surgery: robotic assisted TLH, bilateral salpingectomy  Discharge Exam: Blood pressure 108/67, pulse 78, temperature 97.8 F (36.6 C), temperature source Oral, resp. rate 18, SpO2 98.00%. General appearance: alert and cooperative Resp: clear to auscultation bilaterally Cardio: regular rate and rhythm, S1, S2 normal, no murmur, click, rub or gallop GI: soft, mildly and  appropriately tender, non distended, +BS Extremities: extremities normal, atraumatic, no cyanosis or edema Incision/Wound: clean/dry/intact  Disposition: 01-Home or Self Care   Future Appointments Provider Department Dept Phone   10/18/2012 12:30 PM Annamaria Boots, MD [place] [facility] [phone]   11/10/2012 2:30 PM Annamaria Boots, MD [facility] [phone]   05/26/2013 10:45 AM Annamaria Boots, MD [place] [facility] [phone]       Medication List    STOP taking these medications       ondansetron 4 MG tablet  Commonly known as:  ZOFRAN      TAKE these medications       albuterol 108 (90 BASE) MCG/ACT inhaler  Commonly known as:  PROVENTIL HFA;VENTOLIN HFA  Inhale 2 puffs into the lungs every 6 (six) hours as needed. wheezing     dexlansoprazole 60 MG capsule  Commonly known as:  DEXILANT  Take 60 mg by mouth daily.     fluticasone 50 MCG/ACT nasal spray  Commonly known as:  FLONASE  Place 2 sprays into the nose daily as needed (congestion).     ibuprofen 800 MG tablet  Commonly known as:  ADVIL,MOTRIN  Take 1 tablet (800 mg total) by mouth every 8 (eight) hours as needed for pain.     levothyroxine 50 MCG tablet  Commonly known as:  SYNTHROID, LEVOTHROID  Take 1 tablet (50 mcg total) by mouth daily.     oxyCODONE-acetaminophen 5-325 MG per tablet  Commonly known as:  PERCOCET  Take 2 tablets by mouth every 4 (four) hours as needed for pain. use  only as much as needed to relieve pain     promethazine 25 MG tablet  Commonly known as:  PHENERGAN  Take 1 tablet (25 mg total) by mouth every 6 (six) hours as needed for nausea.           Follow-up Information   Follow up with Annamaria Boots, MD On 10/18/2012. (Appt time 12:30pm)    Contact information:   [address] [place] [address] [phone]       Signed: Annamaria Boots 10/12/2012, 7:05 AM

## 2012-10-13 ENCOUNTER — Telehealth: Payer: Self-pay | Admitting: *Deleted

## 2012-10-13 NOTE — Telephone Encounter (Signed)
Message copied by Alisa Graff on Thu Oct 13, 2012  6:28 PM ------      Message from: Denise Ray      Created: Thu Oct 13, 2012 10:31 AM       Will you let pt know her pathology was negative.  Tube showed just a paratubal cyst.  She went home same day so please see how she is doing. ------

## 2012-10-13 NOTE — Telephone Encounter (Signed)
LMTCB.  Calling with path report and see how she is doing post op.

## 2012-10-14 ENCOUNTER — Encounter: Payer: Self-pay | Admitting: *Deleted

## 2012-10-14 ENCOUNTER — Other Ambulatory Visit: Payer: Self-pay | Admitting: Obstetrics & Gynecology

## 2012-10-14 ENCOUNTER — Ambulatory Visit
Admission: RE | Admit: 2012-10-14 | Discharge: 2012-10-14 | Disposition: A | Discharge status: Home / Self Care | Payer: 59 | Source: Ambulatory Visit | Attending: Obstetrics & Gynecology | Admitting: Obstetrics & Gynecology

## 2012-10-14 ENCOUNTER — Ambulatory Visit (INDEPENDENT_AMBULATORY_CARE_PROVIDER_SITE_OTHER): Payer: 59 | Admitting: Obstetrics & Gynecology

## 2012-10-14 ENCOUNTER — Telehealth: Payer: Self-pay | Admitting: *Deleted

## 2012-10-14 ENCOUNTER — Encounter: Payer: Self-pay | Admitting: Obstetrics & Gynecology

## 2012-10-14 ENCOUNTER — Telehealth: Payer: Self-pay

## 2012-10-14 VITALS — BP 126/88 | HR 60 | Temp 97.1°F | Resp 16

## 2012-10-14 DIAGNOSIS — R05 Cough: Secondary | ICD-10-CM

## 2012-10-14 IMAGING — CR DG CHEST 2V
2 series · 2 of 2 positions shown · non-contrast
Comparison: [DATE]

CLINICAL DATA: Shortness of breath and cough

CHEST - 2 VIEW

[view not recorded (1 of 2)]
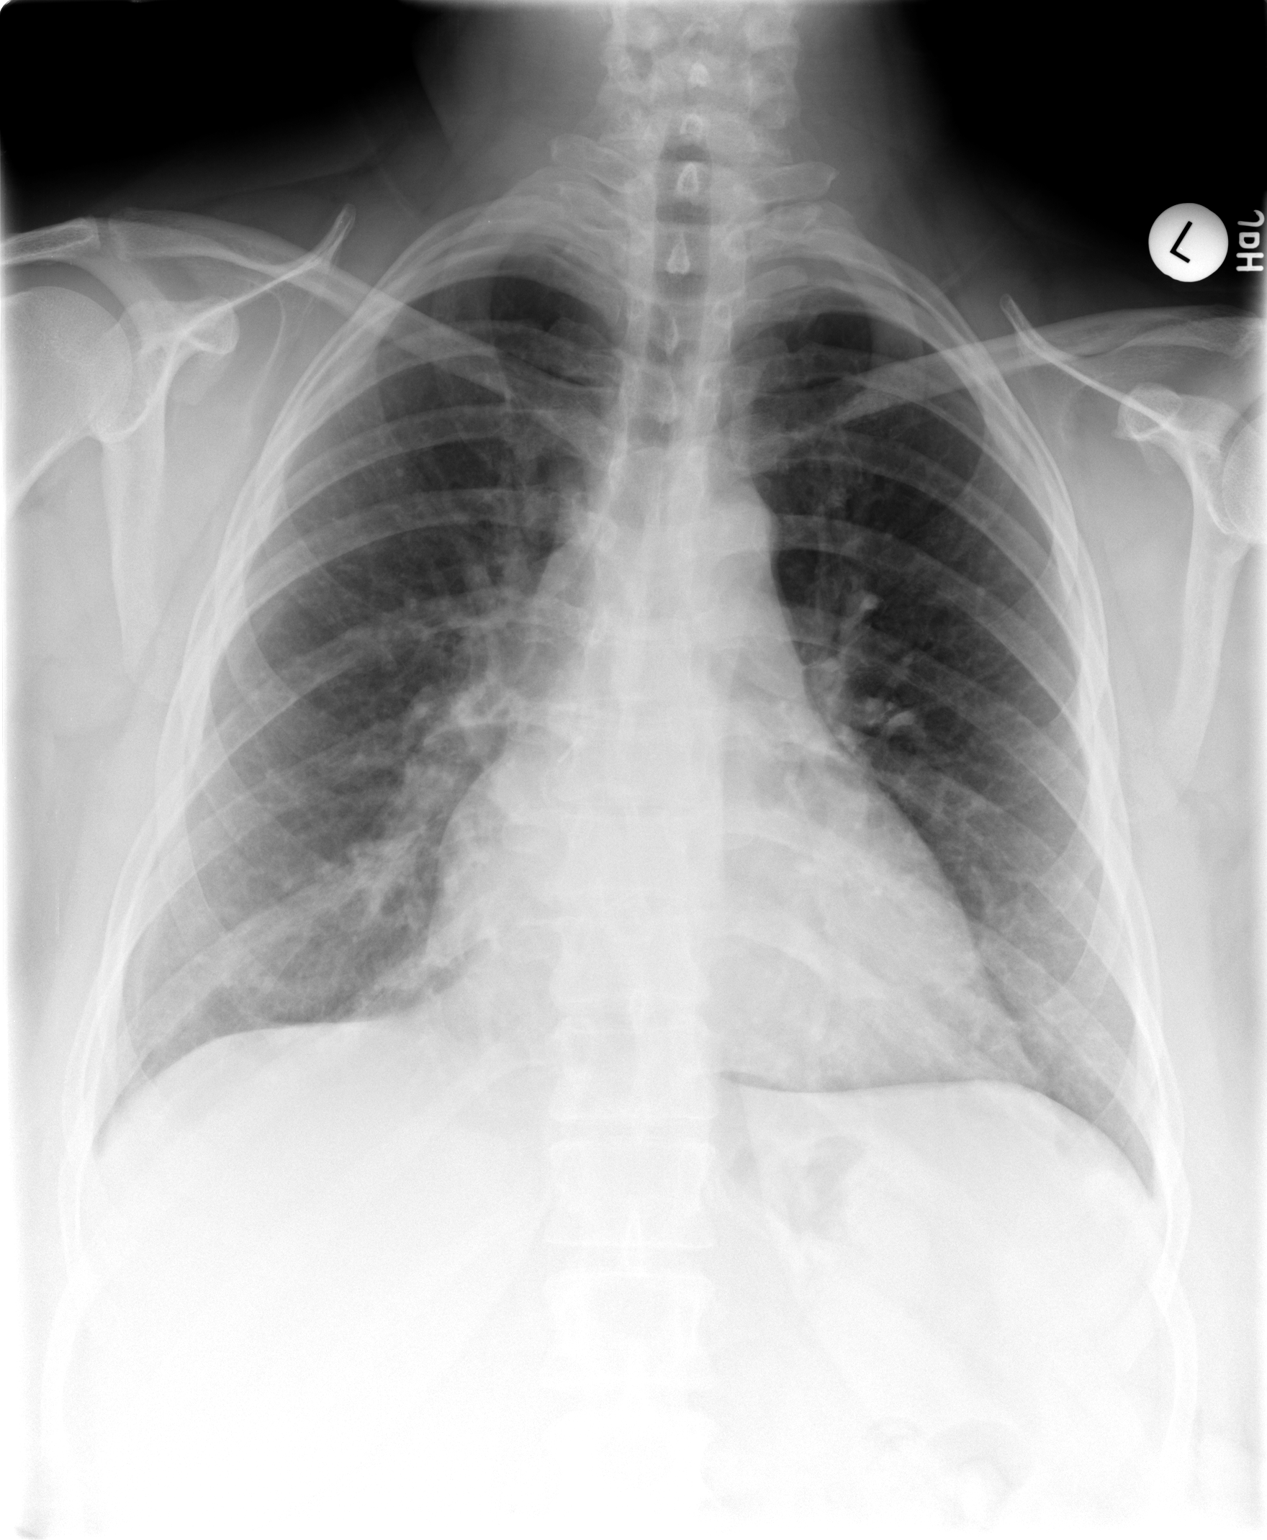

[view not recorded (2 of 2)]
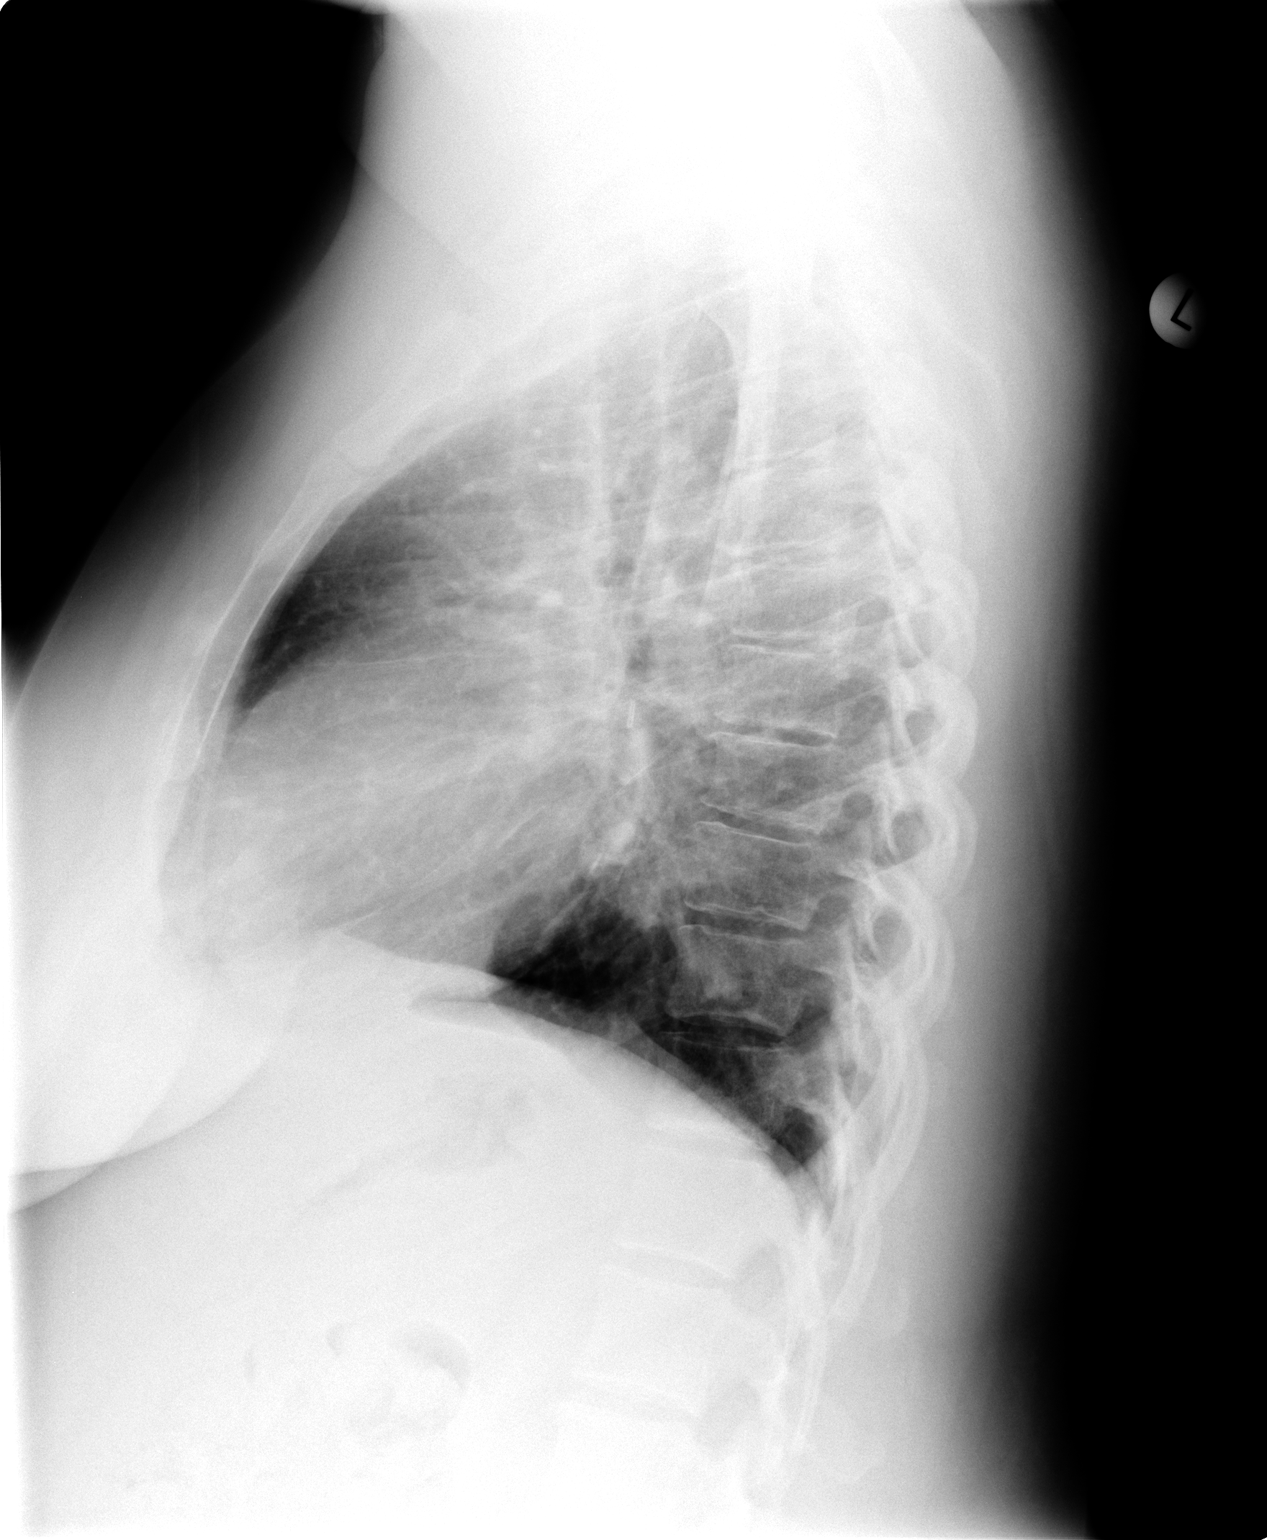

[2 of 2 positions shown; findings below may reference images not displayed]

FINDINGS: The cardiac silhouette is borderline enlarged.  No
mediastinal or hilar masses.

The lungs are clear.  No pleural effusion or pneumothorax.

The bony thorax and surrounding soft tissues are unremarkable.
IMPRESSION: No active disease of the chest.

## 2012-10-14 NOTE — Telephone Encounter (Signed)
Patient notified of all results. 

## 2012-10-14 NOTE — Telephone Encounter (Signed)
Message copied by Alisa Graff on Fri Oct 14, 2012  9:26 AM ------      Message from: Jerene Bears      Created: Thu Oct 13, 2012 10:31 AM       Will you let pt know her pathology was negative.  Tube showed just a paratubal cyst.  She went home same day so please see how she is doing. ------

## 2012-10-14 NOTE — Patient Instructions (Addendum)
Please call me over the weekend if anything changes.

## 2012-10-14 NOTE — Telephone Encounter (Signed)
Patient returning my call.  Initially states she was doing fine but upon questioning, admits she doesn't feel like she is emptying bladder "completely".  Also complains of lower extremity swelling and the feeling that she may be holding "fluid in her lungs". Does not sound audibly short of breath and patient states she was just going to watch it till Monday. Advised i would prefer to check her before weekend so OV today at 1030, (she lives 45 mins away).

## 2012-10-14 NOTE — Telephone Encounter (Signed)
RADIOLOGIST VERBAL REPORT OF NO ACTIVE DISEASE OF THE CHEST. PER DR. Cathrine Muster.

## 2012-10-14 NOTE — Telephone Encounter (Signed)
5/30 lmtcb/kn 

## 2012-10-14 NOTE — Telephone Encounter (Signed)
Message copied by Elisha Headland on Fri Oct 14, 2012  3:20 PM ------      Message from: Jerene Bears      Created: Fri Oct 14, 2012  2:14 PM       Please let pt know CXR was negative.  No lasix needed.  She has my number is she has any issues this weekend. ------

## 2012-10-15 NOTE — Progress Notes (Signed)
Post Operative Visit  Procedure:  Robotic assisted TLH/bilateral Salpingectomy Days Post-op:  3  Subjective: Patient called by office nurse to get post-op update.  She complained of wheezing but no real SOB.  Recommend OV. Patient feels she is having some wheezing and lower extremity edema.  Having abdominal pain and some voiding issues.  Is able to void but having trouble starting stream at times.  No vaginal bleeding.  No fever/chills.  Smoking post-operatively.  No nause/vomiting/diarrhea/constipation.  Had BM yesterday.  Taking colace.  Objective: I have reviewed patient's vital signs.    EXAM General: alert and cooperative Resp: clear to auscultation bilaterally Cardio: regular rate and rhythm, S1, S2 normal, no murmur, click, rub or gallop GI: soft, non distended, +BS, moderately tender Extremities: mild, equal edema, non-pitting Vaginal Bleeding: none  Pt catheterized for 250cc clear urine.  Dip neg in office.  No culture will be sent.  Assessment: 3 days post op from TLH/Bilateral salpingectomy Possible edema but physical exam normal.  Most likely fluid shifts.  Plan: CXR, PA and Lat to R/O pulm edema/effusion/infection Encouraged pt to NOT smoke for next few days as body is getting rid of fluid Motrin/Percocet use reviewed F/U 4 days.  My number provided if she has changes over the weekend.

## 2012-10-17 NOTE — Telephone Encounter (Signed)
Returning nurses phone call

## 2012-10-18 ENCOUNTER — Encounter: Payer: Self-pay | Admitting: Obstetrics & Gynecology

## 2012-10-18 ENCOUNTER — Ambulatory Visit (INDEPENDENT_AMBULATORY_CARE_PROVIDER_SITE_OTHER): Payer: 59 | Admitting: Obstetrics & Gynecology

## 2012-10-18 VITALS — BP 114/68 | HR 80 | Resp 20

## 2012-10-18 DIAGNOSIS — N926 Irregular menstruation, unspecified: Secondary | ICD-10-CM

## 2012-10-18 DIAGNOSIS — N939 Abnormal uterine and vaginal bleeding, unspecified: Secondary | ICD-10-CM

## 2012-10-18 MED ORDER — MAGIC MOUTHWASH W/LIDOCAINE: 5.0000 mL | Freq: Three times a day (TID) | ORAL | Status: DC | PRN

## 2012-10-18 NOTE — Telephone Encounter (Signed)
Left message for patient to return call to office for path report and see how she is doing per Dr. Hyacinth Meeker.

## 2012-10-18 NOTE — Telephone Encounter (Signed)
Left message of need to return call to our office for information from Dr. Hyacinth Meeker.

## 2012-10-18 NOTE — Progress Notes (Signed)
Post Operative Visit  Procedure:robotic assisted TLH/bilateral salpingectomy Days Post-op: 7  Subjective: Doing really well.  Pain is much better.  No vaginal bleeding.  Urinating fine.  Some soreness in abdomen.  Feels "so much better".  C/o irritation in mouth like thrush.  Would like Rx called into her pharmacy.    Objective: BP 114/68  Pulse 80  Resp 20  EXAM General: alert and cooperative GI: soft, non-tender; bowel sounds normal; no masses,  no organomegaly Extremities: extremities normal, atraumatic, no cyanosis or edema Vaginal Bleeding: none  Assessment: s/p robotic assisted TLH, bilateral salpingectomy Oral thrush  Plan: Recheck 3 weeks Magic mouthwash rx to pharmacy

## 2012-10-18 NOTE — Patient Instructions (Signed)
Call if you have any new issues or concerns

## 2012-11-10 ENCOUNTER — Encounter: Payer: Self-pay | Admitting: Obstetrics & Gynecology

## 2012-11-10 ENCOUNTER — Ambulatory Visit (INDEPENDENT_AMBULATORY_CARE_PROVIDER_SITE_OTHER): Payer: 59 | Admitting: Obstetrics & Gynecology

## 2012-11-10 VITALS — BP 104/70 | HR 80 | Resp 16 | Ht 64.75 in | Wt 241.4 lb

## 2012-11-10 DIAGNOSIS — N949 Unspecified condition associated with female genital organs and menstrual cycle: Secondary | ICD-10-CM

## 2012-11-10 DIAGNOSIS — R102 Pelvic and perineal pain: Secondary | ICD-10-CM

## 2012-11-10 NOTE — Patient Instructions (Signed)
Please call with any new issues/problems 

## 2012-11-10 NOTE — Progress Notes (Signed)
Post Operative Visit  Procedure:Robotic assisted TLH, bilateral salpingectomy Date of procedure:  5/27  Subjective: Doing well.  No vaginal bleeding.  No fevers.  Bowel/bladder function nl.  Patient wrestled down a baby deer last Thursday and has had some soreness, not requiring pain medication  Objective: BP 104/70  Pulse 80  Resp 16  Ht 5' 4.75" (1.645 m)  Wt 241 lb 6.4 oz (109.498 kg)  BMI 40.46 kg/m2  EXAM General: alert and cooperative GI: incision: clean, dry and intact and abdomen soft, nontender without masses Extremities: extremities normal, atraumatic, no cyanosis or edema Vaginal Bleeding: none Vagina:  Cuff well healed without masses or firmness  Assessment: s/p robotic assisted TLH/bilateral salpingectomy  Plan: Recheck one year for AEX Pt knows to not have intercourse for four more weeks.

## 2013-01-06 ENCOUNTER — Ambulatory Visit (INDEPENDENT_AMBULATORY_CARE_PROVIDER_SITE_OTHER): Payer: 59

## 2013-01-06 ENCOUNTER — Encounter: Payer: Self-pay | Admitting: Family Medicine

## 2013-01-06 ENCOUNTER — Ambulatory Visit (INDEPENDENT_AMBULATORY_CARE_PROVIDER_SITE_OTHER): Payer: 59 | Admitting: Family Medicine

## 2013-01-06 VITALS — BP 95/56 | HR 66 | Temp 98.2°F | Ht 64.0 in | Wt 245.6 lb

## 2013-01-06 DIAGNOSIS — R5381 Other malaise: Secondary | ICD-10-CM

## 2013-01-06 DIAGNOSIS — E559 Vitamin D deficiency, unspecified: Secondary | ICD-10-CM

## 2013-01-06 DIAGNOSIS — Z0000 Encounter for general adult medical examination without abnormal findings: Secondary | ICD-10-CM

## 2013-01-06 DIAGNOSIS — R0602 Shortness of breath: Secondary | ICD-10-CM

## 2013-01-06 LAB — POCT CBC
Granulocyte percent: 65.1 %G (ref 37–80)
HCT, POC: 41.5 % (ref 37.7–47.9)
Hemoglobin: 14.3 g/dL (ref 12.2–16.2)
Lymph, poc: 2.5 (ref 0.6–3.4)
MCH, POC: 29.8 pg (ref 27–31.2)
MCHC: 34.5 g/dL (ref 31.8–35.4)
MCV: 86.3 fL (ref 80–97)
MPV: 8.3 fL (ref 0–99.8)
POC Granulocyte: 5.5 (ref 2–6.9)
POC LYMPH PERCENT: 29.6 %L (ref 10–50)
Platelet Count, POC: 305 10*3/uL (ref 142–424)
RBC: 4.8 M/uL (ref 4.04–5.48)
RDW, POC: 13.4 %
WBC: 8.5 10*3/uL (ref 4.6–10.2)

## 2013-01-06 IMAGING — CR DG CHEST 2V
2 series · 2 of 2 positions shown · non-contrast
Comparison: [DATE]

CLINICAL DATA: Shortness of breath, physical exam, smoker, past
history asthma, GERD

CHEST - 2 VIEW

[view not recorded (1 of 2)]
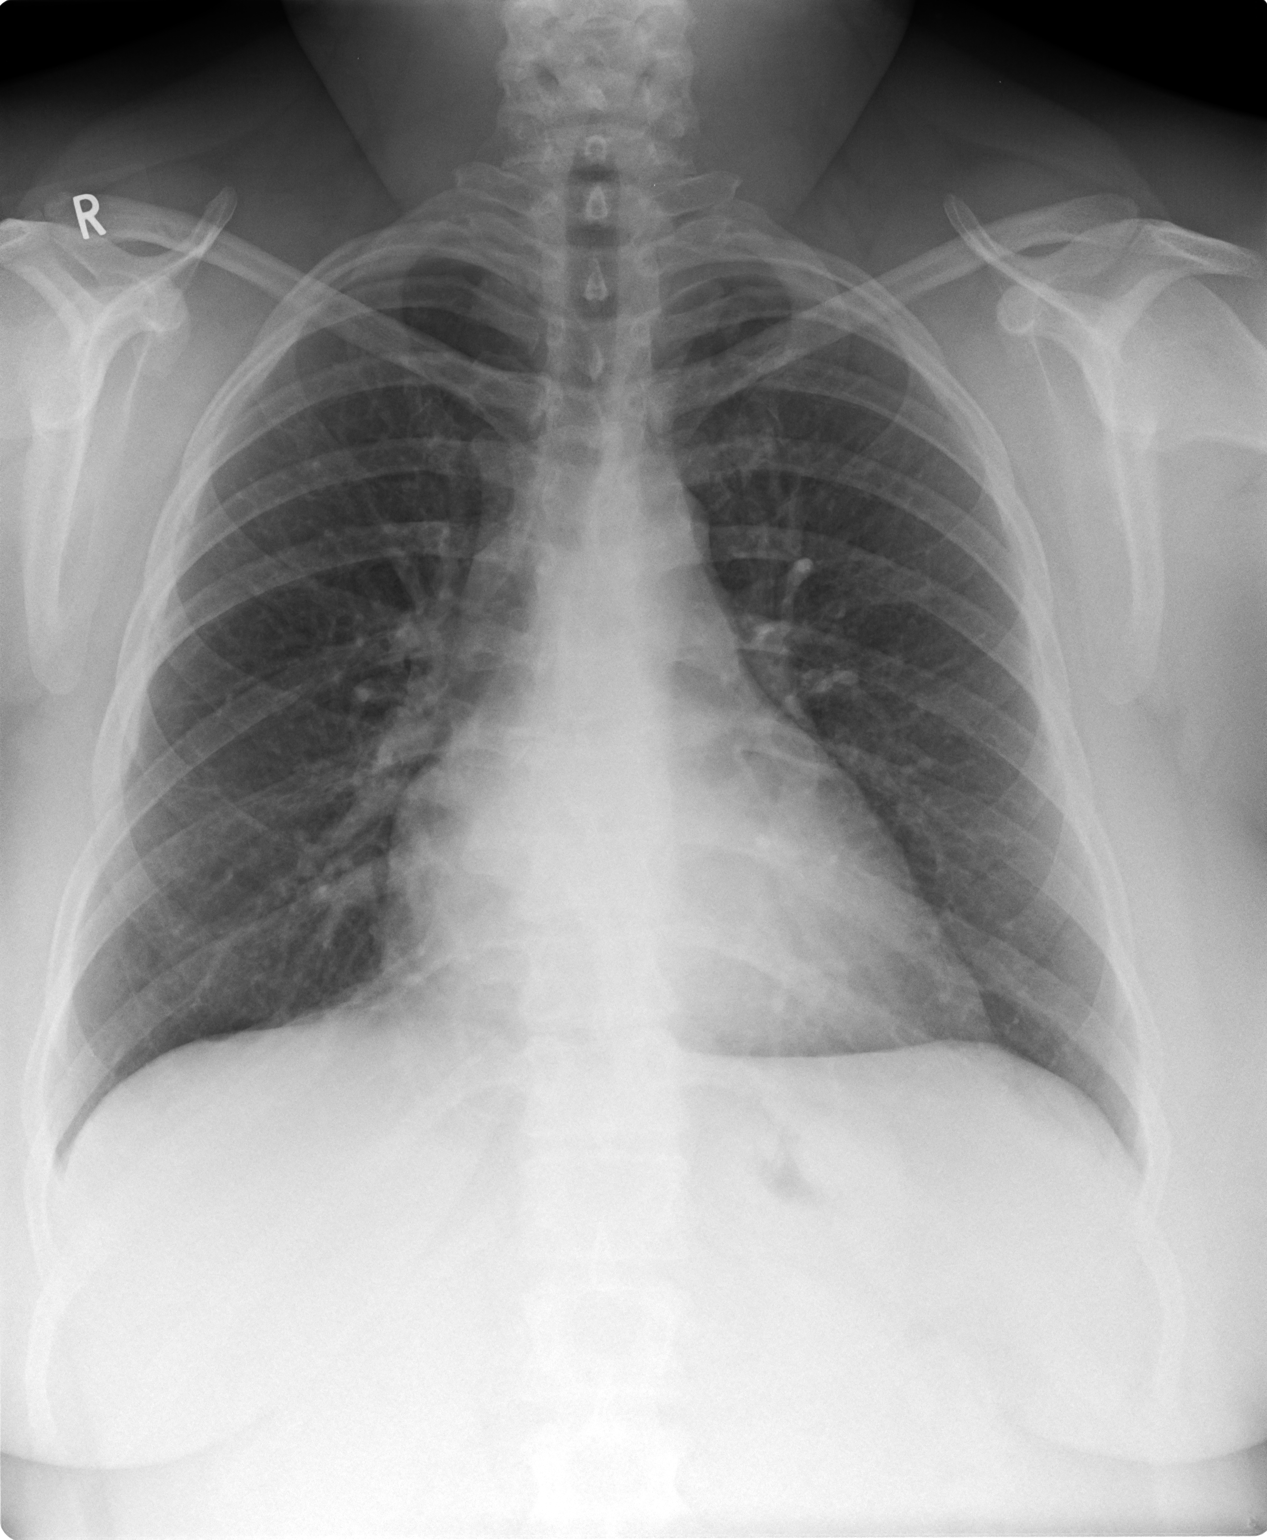

[view not recorded (2 of 2)]
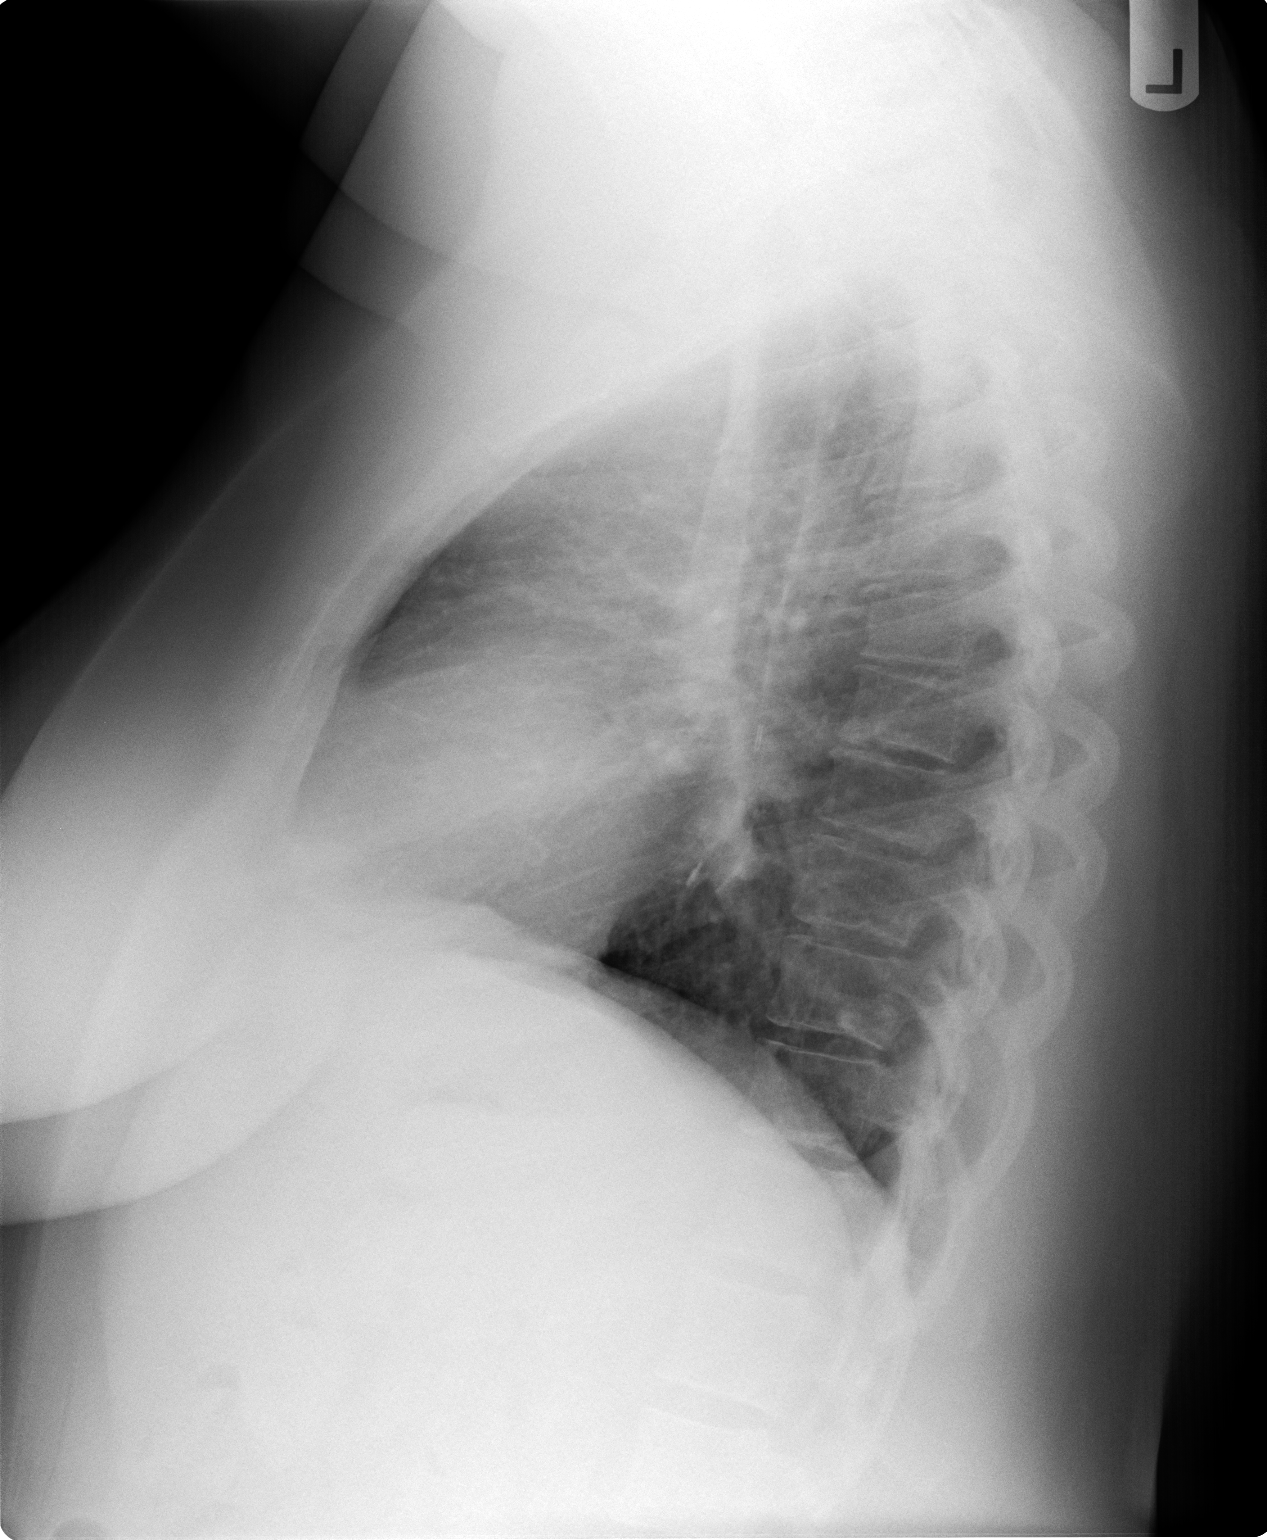

[2 of 2 positions shown; findings below may reference images not displayed]

FINDINGS: Borderline enlargement of cardiac silhouette, unchanged.
Normal mediastinal contours and pulmonary vascularity.
Lungs clear.
No pleural effusion or pneumothorax.
Bones unremarkable.
IMPRESSION: No acute abnormalities.

## 2013-01-06 MED ORDER — TIOTROPIUM BROMIDE MONOHYDRATE 18 MCG IN CAPS: 18.0000 ug | ORAL_CAPSULE | Freq: Every day | RESPIRATORY_TRACT | Status: DC

## 2013-01-06 MED ORDER — ALBUTEROL SULFATE HFA 108 (90 BASE) MCG/ACT IN AERS: 2.0000 | INHALATION_SPRAY | Freq: Four times a day (QID) | RESPIRATORY_TRACT | Status: AC | PRN

## 2013-01-06 MED ORDER — BUDESONIDE-FORMOTEROL FUMARATE 80-4.5 MCG/ACT IN AERO: 2.0000 | INHALATION_SPRAY | Freq: Two times a day (BID) | RESPIRATORY_TRACT | Status: DC

## 2013-01-06 NOTE — Progress Notes (Signed)
  Subjective:    Patient ID: Denise Ray, female    DOB: 07/01/1970, 42 y.o.   MRN: [id number]  HPI This 42 y.o. female presents for evaluation of CPE.  She has hx of asthma and she is a smoker and she States she gets exacerbations 2x year and has to go to ED.  She has been having some anxiety and mood Swings.  She states she thinks she is bipolar.  She states she has tried zoloft after having a child and she  Had homicidal ideations.  She has hx of snoring and insomnia.  She states she feels tired all the time and has Hypersomnolence.  She is a cigarette smoker.     Review of Systems C/o snoring, fatigue, insomnia, SOB, and anxiety. No chest pain, HA, dizziness, vision change, N/V, diarrhea, constipation, dysuria, urinary urgency or frequency, myalgias, arthralgias or rash.     Objective:   Physical Exam  Vital signs noted  Well developed well nourished obese female.  HEENT - Head atraumatic Normocephalic                Eyes - PERRLA, Conjuctiva - clear Sclera- Clear EOMI                Ears - EAC's Wnl TM's Wnl Gross Hearing WNL                Nose - Nares patent                 Throat - oropharanx wnl Respiratory - Lungs diminished throughout. Cardiac - RRR S1 and S2 without murmur GI - Abdomen soft Nontender and bowel sounds active x 4 Extremities - No edema. Neuro - Grossly intact.     PFT with fev1 78%  CXR 2 views preliminary - No infiltrates otherwise normal cxr.   Assessment & Plan:  Routine general medical examination at a health care facility - Plan: POCT CBC, CMP14+EGFR, Lipid panel, Vit D  25 hydroxy (rtn osteoporosis monitoring), Vitamin B12, Thyroid Panel With TSH, DG Chest 2 View.  Mammogram this year was normal. She   Unspecified vitamin D deficiency - Plan: Vit D  25 hydroxy (rtn osteoporosis monitoring), DG Chest 2 View.  Continue otc vitaminD.  Other malaise and fatigue - Plan: Vit D  25 hydroxy (rtn osteoporosis monitoring), Vitamin B12, DG Chest  2 View  Shortness of breath - Plan: PR BREATHING CAPACITY TEST, tiotropium (SPIRIVA HANDIHALER) 18 MCG inhalation capsule, albuterol (PROVENTIL HFA;VENTOLIN HFA) 108 (90 BASE) MCG/ACT inhaler, budesonide-formoterol (SYMBICORT) 80-4.5 MCG/ACT inhaler, DG Chest 2 View  Psychiatric illness - Patient given hand out of psychiatrist and told to get appointment.  Explained that with her hx I feel it best to  Get dx and tx'd by psychiatry.   Follow up in 3 months or prn.

## 2013-01-06 NOTE — Patient Instructions (Signed)

## 2013-01-07 ENCOUNTER — Other Ambulatory Visit: Payer: Self-pay | Admitting: Family Medicine

## 2013-01-07 DIAGNOSIS — E559 Vitamin D deficiency, unspecified: Secondary | ICD-10-CM

## 2013-01-07 LAB — CMP14+EGFR
ALT: 15 IU/L (ref 0–32)
AST: 20 IU/L (ref 0–40)
Albumin/Globulin Ratio: 1.4 (ref 1.1–2.5)
Albumin: 4 g/dL (ref 3.5–5.5)
Alkaline Phosphatase: 66 IU/L (ref 39–117)
BUN/Creatinine Ratio: 9 (ref 9–23)
BUN: 7 mg/dL (ref 6–24)
CO2: 21 mmol/L (ref 18–29)
Calcium: 9.7 mg/dL (ref 8.7–10.2)
Chloride: 101 mmol/L (ref 97–108)
Creatinine, Ser: 0.78 mg/dL (ref 0.57–1.00)
GFR calc Af Amer: 110 mL/min/{1.73_m2} (ref >59)
GFR calc non Af Amer: 95 mL/min/{1.73_m2} (ref >59)
Globulin, Total: 2.9 g/dL (ref 1.5–4.5)
Glucose: 106 mg/dL — ABNORMAL HIGH (ref 65–99)
Potassium: 4.4 mmol/L (ref 3.5–5.2)
Sodium: 140 mmol/L (ref 134–144)
Total Bilirubin: 0.3 mg/dL (ref 0.0–1.2)
Total Protein: 6.9 g/dL (ref 6.0–8.5)

## 2013-01-07 LAB — LIPID PANEL
Chol/HDL Ratio: 5.2 ratio units — ABNORMAL HIGH (ref 0.0–4.4)
Cholesterol, Total: 171 mg/dL (ref 100–199)
HDL: 33 mg/dL — ABNORMAL LOW (ref >39)
LDL Calculated: 79 mg/dL (ref 0–99)
Triglycerides: 295 mg/dL — ABNORMAL HIGH (ref 0–149)
VLDL Cholesterol Cal: 59 mg/dL — ABNORMAL HIGH (ref 5–40)

## 2013-01-07 LAB — THYROID PANEL WITH TSH
Free Thyroxine Index: 1.9 (ref 1.2–4.9)
T3 Uptake Ratio: 24 % (ref 24–39)
T4, Total: 8 ug/dL (ref 4.5–12.0)
TSH: 1.25 u[IU]/mL (ref 0.450–4.500)

## 2013-01-07 LAB — VITAMIN D 25 HYDROXY (VIT D DEFICIENCY, FRACTURES): Vit D, 25-Hydroxy: 20.3 ng/mL — ABNORMAL LOW (ref 30.0–100.0)

## 2013-01-07 LAB — VITAMIN B12: Vitamin B-12: 372 pg/mL (ref 211–946)

## 2013-01-07 MED ORDER — VITAMIN D (ERGOCALCIFEROL) 1.25 MG (50000 UNIT) PO CAPS: 50000.0000 [IU] | ORAL_CAPSULE | ORAL | Status: DC

## 2013-01-18 ENCOUNTER — Encounter (HOSPITAL_COMMUNITY): Payer: Self-pay | Admitting: Psychiatry

## 2013-01-18 ENCOUNTER — Ambulatory Visit (INDEPENDENT_AMBULATORY_CARE_PROVIDER_SITE_OTHER): Payer: Self-pay | Admitting: Psychiatry

## 2013-01-18 VITALS — BP 130/90 | Ht 64.0 in | Wt 246.0 lb

## 2013-01-18 DIAGNOSIS — F339 Major depressive disorder, recurrent, unspecified: Secondary | ICD-10-CM | POA: Insufficient documentation

## 2013-01-18 DIAGNOSIS — F4310 Post-traumatic stress disorder, unspecified: Secondary | ICD-10-CM

## 2013-01-18 MED ORDER — DULOXETINE HCL 30 MG PO CPEP: 30.0000 mg | ORAL_CAPSULE | Freq: Every day | ORAL | Status: DC

## 2013-01-18 MED ORDER — DIAZEPAM 2 MG PO TABS: 2.0000 mg | ORAL_TABLET | Freq: Two times a day (BID) | ORAL | Status: DC | PRN
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
## 2013-01-30 ENCOUNTER — Ambulatory Visit (INDEPENDENT_AMBULATORY_CARE_PROVIDER_SITE_OTHER): Payer: 59 | Admitting: Psychiatry

## 2013-01-30 DIAGNOSIS — F339 Major depressive disorder, recurrent, unspecified: Secondary | ICD-10-CM

## 2013-01-30 DIAGNOSIS — F4310 Post-traumatic stress disorder, unspecified: Secondary | ICD-10-CM
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
## 2013-01-30 NOTE — Patient Instructions (Signed)
Discussed orally 

## 2013-02-09 ENCOUNTER — Other Ambulatory Visit: Payer: Self-pay | Admitting: Family Medicine

## 2013-02-09 ENCOUNTER — Other Ambulatory Visit: Payer: Self-pay | Admitting: *Deleted

## 2013-02-09 DIAGNOSIS — G4733 Obstructive sleep apnea (adult) (pediatric): Secondary | ICD-10-CM

## 2013-02-10 ENCOUNTER — Ambulatory Visit (INDEPENDENT_AMBULATORY_CARE_PROVIDER_SITE_OTHER): Payer: 59 | Admitting: Psychiatry

## 2013-02-10 DIAGNOSIS — F339 Major depressive disorder, recurrent, unspecified: Secondary | ICD-10-CM

## 2013-02-10 DIAGNOSIS — F4310 Post-traumatic stress disorder, unspecified: Secondary | ICD-10-CM
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
## 2013-02-16 NOTE — Patient Instructions (Signed)
Discussed orally 

## 2013-02-17 ENCOUNTER — Encounter (HOSPITAL_COMMUNITY): Payer: Self-pay | Admitting: Psychiatry

## 2013-02-17 ENCOUNTER — Ambulatory Visit (INDEPENDENT_AMBULATORY_CARE_PROVIDER_SITE_OTHER): Payer: 59 | Admitting: Psychiatry

## 2013-02-17 VITALS — BP 120/82 | Ht 64.0 in | Wt 243.0 lb

## 2013-02-17 DIAGNOSIS — F4310 Post-traumatic stress disorder, unspecified: Secondary | ICD-10-CM

## 2013-02-17 MED ORDER — DULOXETINE HCL 30 MG PO CPEP: 30.0000 mg | ORAL_CAPSULE | Freq: Every day | ORAL | Status: DC

## 2013-02-17 MED ORDER — DIAZEPAM 5 MG PO TABS: 5.0000 mg | ORAL_TABLET | Freq: Two times a day (BID) | ORAL | Status: DC
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
## 2013-02-27 ENCOUNTER — Ambulatory Visit (HOSPITAL_COMMUNITY): Payer: Self-pay | Admitting: Psychiatry

## 2013-03-22 ENCOUNTER — Encounter: Payer: Self-pay | Admitting: Family Medicine

## 2013-03-22 ENCOUNTER — Ambulatory Visit (INDEPENDENT_AMBULATORY_CARE_PROVIDER_SITE_OTHER): Payer: 59 | Admitting: Family Medicine

## 2013-03-22 VITALS — BP 124/69 | HR 74 | Temp 98.6°F | Ht 64.0 in | Wt 241.0 lb

## 2013-03-22 DIAGNOSIS — Z7189 Other specified counseling: Secondary | ICD-10-CM

## 2013-03-22 DIAGNOSIS — J45901 Unspecified asthma with (acute) exacerbation: Secondary | ICD-10-CM

## 2013-03-22 DIAGNOSIS — R05 Cough: Secondary | ICD-10-CM

## 2013-03-22 DIAGNOSIS — F17200 Nicotine dependence, unspecified, uncomplicated: Secondary | ICD-10-CM

## 2013-03-22 DIAGNOSIS — Z8673 Personal history of transient ischemic attack (TIA), and cerebral infarction without residual deficits: Secondary | ICD-10-CM

## 2013-03-22 DIAGNOSIS — Z716 Tobacco abuse counseling: Secondary | ICD-10-CM

## 2013-03-22 MED ORDER — ASPIRIN EC 81 MG PO TBEC: 81.0000 mg | DELAYED_RELEASE_TABLET | Freq: Every day | ORAL | Status: DC

## 2013-03-22 MED ORDER — PREDNISONE 50 MG PO TABS: ORAL_TABLET | ORAL | Status: DC

## 2013-03-22 MED ORDER — METHYLPREDNISOLONE SODIUM SUCC 125 MG IJ SOLR
125.0000 mg | Freq: Once | INTRAMUSCULAR | Status: AC
  Administered 2013-03-22: 125 mg via INTRAMUSCULAR

## 2013-03-22 MED ORDER — IPRATROPIUM-ALBUTEROL 0.5-2.5 (3) MG/3ML IN SOLN
3.0000 mL | RESPIRATORY_TRACT | Status: DC
  Administered 2013-03-22: 3 mL via RESPIRATORY_TRACT

## 2013-03-22 MED ORDER — AZITHROMYCIN 250 MG PO TABS: ORAL_TABLET | ORAL | Status: DC

## 2013-03-22 NOTE — Patient Instructions (Signed)
Smoking Cessation Quitting smoking is important to your health and has many advantages. However, it is not always easy to quit since nicotine is a very addictive drug. Often times, people try 3 times or more before being able to quit. This document explains the best ways for you to prepare to quit smoking. Quitting takes hard work and a lot of effort, but you can do it. ADVANTAGES OF QUITTING SMOKING  You will live longer, feel better, and live better.  Your body will feel the impact of quitting smoking almost immediately.  Within 20 minutes, blood pressure decreases. Your pulse returns to its normal level.  After 8 hours, carbon monoxide levels in the blood return to normal. Your oxygen level increases.  After 24 hours, the chance of having a heart attack starts to decrease. Your breath, hair, and body stop smelling like smoke.  After 48 hours, damaged nerve endings begin to recover. Your sense of taste and smell improve.  After 72 hours, the body is virtually free of nicotine. Your bronchial tubes relax and breathing becomes easier.  After 2 to 12 weeks, lungs can hold more air. Exercise becomes easier and circulation improves.  The risk of having a heart attack, stroke, cancer, or lung disease is greatly reduced.  After 1 year, the risk of coronary heart disease is cut in half.  After 5 years, the risk of stroke falls to the same as a nonsmoker.  After 10 years, the risk of lung cancer is cut in half and the risk of other cancers decreases significantly.  After 15 years, the risk of coronary heart disease drops, usually to the level of a nonsmoker.  If you are pregnant, quitting smoking will improve your chances of having a healthy baby.  The people you live with, especially any children, will be healthier.  You will have extra money to spend on things other than cigarettes. QUESTIONS TO THINK ABOUT BEFORE ATTEMPTING TO QUIT You may want to talk about your answers with your  caregiver.  Why do you want to quit?  If you tried to quit in the past, what helped and what did not?  What will be the most difficult situations for you after you quit? How will you plan to handle them?  Who can help you through the tough times? Your family? Friends? A caregiver?  What pleasures do you get from smoking? What ways can you still get pleasure if you quit? Here are some questions to ask your caregiver:  How can you help me to be successful at quitting?  What medicine do you think would be best for me and how should I take it?  What should I do if I need more help?  What is smoking withdrawal like? How can I get information on withdrawal? GET READY  Set a quit date.  Change your environment by getting rid of all cigarettes, ashtrays, matches, and lighters in your home, car, or work. Do not let people smoke in your home.  Review your past attempts to quit. Think about what worked and what did not. GET SUPPORT AND ENCOURAGEMENT You have a better chance of being successful if you have help. You can get support in many ways.  Tell your family, friends, and co-workers that you are going to quit and need their support. Ask them not to smoke around you.  Get individual, group, or telephone counseling and support. Programs are available at local hospitals and health centers. Call your local health department for   information about programs in your area.  Spiritual beliefs and practices may help some smokers quit.  Download a "quit meter" on your computer to keep track of quit statistics, such as how long you have gone without smoking, cigarettes not smoked, and money saved.  Get a self-help book about quitting smoking and staying off of tobacco. LEARN NEW SKILLS AND BEHAVIORS  Distract yourself from urges to smoke. Talk to someone, go for a walk, or occupy your time with a task.  Change your normal routine. Take a different route to work. Drink tea instead of coffee.  Eat breakfast in a different place.  Reduce your stress. Take a hot bath, exercise, or read a book.  Plan something enjoyable to do every day. Reward yourself for not smoking.  Explore interactive web-based programs that specialize in helping you quit. GET MEDICINE AND USE IT CORRECTLY Medicines can help you stop smoking and decrease the urge to smoke. Combining medicine with the above behavioral methods and support can greatly increase your chances of successfully quitting smoking.  Nicotine replacement therapy helps deliver nicotine to your body without the negative effects and risks of smoking. Nicotine replacement therapy includes nicotine gum, lozenges, inhalers, nasal sprays, and skin patches. Some may be available over-the-counter and others require a prescription.  Antidepressant medicine helps people abstain from smoking, but how this works is unknown. This medicine is available by prescription.  Nicotinic receptor partial agonist medicine simulates the effect of nicotine in your brain. This medicine is available by prescription. Ask your caregiver for advice about which medicines to use and how to use them based on your health history. Your caregiver will tell you what side effects to look out for if you choose to be on a medicine or therapy. Carefully read the information on the package. Do not use any other product containing nicotine while using a nicotine replacement product.  RELAPSE OR DIFFICULT SITUATIONS Most relapses occur within the first 3 months after quitting. Do not be discouraged if you start smoking again. Remember, most people try several times before finally quitting. You may have symptoms of withdrawal because your body is used to nicotine. You may crave cigarettes, be irritable, feel very hungry, cough often, get headaches, or have difficulty concentrating. The withdrawal symptoms are only temporary. They are strongest when you first quit, but they will go away within  10 14 days. To reduce the chances of relapse, try to:  Avoid drinking alcohol. Drinking lowers your chances of successfully quitting.  Reduce the amount of caffeine you consume. Once you quit smoking, the amount of caffeine in your body increases and can give you symptoms, such as a rapid heartbeat, sweating, and anxiety.  Avoid smokers because they can make you want to smoke.  Do not let weight gain distract you. Many smokers will gain weight when they quit, usually less than 10 pounds. Eat a healthy diet and stay active. You can always lose the weight gained after you quit.  Find ways to improve your mood other than smoking. FOR MORE INFORMATION  www.smokefree.gov  Document Released: 04/28/2001 Document Revised: 11/03/2011 Document Reviewed: 08/13/2011 [employer] Patient Information 2014 [employer].  

## 2013-03-22 NOTE — Progress Notes (Signed)
  Subjective:    Patient ID: Denise Ray, female    DOB: 03-Aug-1970, 42 y.o.   MRN: [id number]  HPI Patient presents today with asthma exacerbation. Patient based on history of moderate persistent asthma as well as one pack per day smoker. Currently taking as per review of and Symbicort daily for asthma. Patient states that she's had worsening dry cough as well as increased wheezing over the past 4 days. Patient states that she's been using her albuterol inhaler as well as nebulizer treatment multiple times per day without improvement in her symptoms. No fevers or chills. No rhinorrhea or postnasal drip. Patient is a respiratory therapist. Patient also has a baseline history of obstructive sleep apnea on CPAP Patient also reports a prior history of VATS procedure for intrathoracic mass removal several years ago.  Patient states that this will be her third exacerbation this year. Has had no hospitalizations  Patient also reports a questionable history of TIA in the remote past. No residual deficits per patient. Is not on baby aspirin. Review of Systems  All other systems reviewed and are negative.       Objective:   Physical Exam  Constitutional: She appears well-developed and well-nourished.  HENT:  Head: Normocephalic and atraumatic.  Right Ear: External ear normal.  Left Ear: External ear normal.  Mouth/Throat: Oropharynx is clear and moist.  Eyes: Conjunctivae are normal. Pupils are equal, round, and reactive to light.  Neck: Normal range of motion.  Cardiovascular: Normal rate and regular rhythm.   Pulmonary/Chest:  Generally poor air movement. No respiratory sugars are increased work of breathing. Minimal wheezes on exam.  Abdominal: Soft.  Musculoskeletal: Normal range of motion.  Neurological: She is alert.  Skin: Skin is warm.          Assessment & Plan:  Cough - Plan: methylPREDNISolone sodium succinate (SOLU-MEDROL) 125 mg/2 mL injection 125 mg,  ipratropium-albuterol (DUONEB) 0.5-2.5 (3) MG/3ML nebulizer solution 3 mL  Asthma with acute exacerbation - Plan: predniSONE (DELTASONE) 50 MG tablet, azithromycin (ZITHROMAX) 250 MG tablet   Acute asthma exacerbation today. Solu-Medrol 125 mg IM x1. We'll place on an extended course of prednisone for acute treatment in addition to azithromycin for atypical coverage. 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 Patient expressed understanding. Discuss smoking cessation with the patient is [place] directly effects respiratory status as well as her questionable history of TIA in the past. Also start a baby aspirin. Follow up as needed.

## 2013-03-29 ENCOUNTER — Ambulatory Visit (INDEPENDENT_AMBULATORY_CARE_PROVIDER_SITE_OTHER): Payer: 59 | Admitting: Family Medicine

## 2013-03-29 ENCOUNTER — Encounter: Payer: Self-pay | Admitting: Family Medicine

## 2013-03-29 VITALS — BP 134/74 | HR 97 | Temp 99.7°F | Ht 64.0 in | Wt 243.0 lb

## 2013-03-29 DIAGNOSIS — H6982 Other specified disorders of Eustachian tube, left ear: Secondary | ICD-10-CM

## 2013-03-29 DIAGNOSIS — Z716 Tobacco abuse counseling: Secondary | ICD-10-CM

## 2013-03-29 DIAGNOSIS — Z7189 Other specified counseling: Secondary | ICD-10-CM

## 2013-03-29 DIAGNOSIS — H6980 Other specified disorders of Eustachian tube, unspecified ear: Secondary | ICD-10-CM

## 2013-03-29 DIAGNOSIS — F17200 Nicotine dependence, unspecified, uncomplicated: Secondary | ICD-10-CM

## 2013-03-29 NOTE — Progress Notes (Signed)
  Subjective:    Patient ID: Denise Ray, female    DOB: 06/16/1970, 42 y.o.   MRN: [id number]  HPI EAR PAIN Location:  L ear  Description: L ear discomfort and pressure  Onset:  1-2 days  Modifying factors: was seen last week for URI induced asthma exacerbation. Was placed on prednisone and zpk. Finished zpak today.   Symptoms  Sensation of fullness: mild Ear discharge: no URI symptoms: improving   Fever: no Tinnitus:no   Dizziness:no   Hearing loss:no   Toothache: no Rashes or lesions: no Facial muscle weakness: no  Red Flags Recent trauma: no PMH prior ear surgery:  no Diabetes or Immunosuppresion: no      Review of Systems  All other systems reviewed and are negative.       Objective:   Physical Exam  Constitutional: She appears well-developed and well-nourished.  HENT:  Head: Normocephalic and atraumatic.  Right Ear: External ear normal.  Left Ear: External ear normal.  No TM bulging or erythema   Eyes: Conjunctivae are normal. Pupils are equal, round, and reactive to light.  Neck: Normal range of motion. Neck supple.  Cardiovascular: Normal rate and regular rhythm.   Pulmonary/Chest: Effort normal and breath sounds normal.  Abdominal: Soft. Bowel sounds are normal.  Musculoskeletal: Normal range of motion.  Lymphadenopathy:    She has no cervical adenopathy.  Neurological: She is alert.  Skin: Skin is warm.          Assessment & Plan:  Eustachian tube dysfunction, left  Tobacco abuse counseling  Suspect post viral eustachian tube dysfunction with allergies and smoking as being secondary exacerbators.  Last day of zpak was today. WIll remain in system at therapeutic range for next 10-14 days for otitis covergage.  Discussed smoking cessation and allergic rhinitis treatment with nasal steroid and oral antihistamine. Discussed ENT red flags.  Follow up as needed. Consider ENT follow up if sxs persist despite treatment.

## 2013-04-19 ENCOUNTER — Ambulatory Visit (INDEPENDENT_AMBULATORY_CARE_PROVIDER_SITE_OTHER): Payer: 59 | Admitting: Psychiatry

## 2013-04-19 ENCOUNTER — Encounter (HOSPITAL_COMMUNITY): Payer: Self-pay | Admitting: Psychiatry

## 2013-04-19 VITALS — BP 110/82 | Ht 64.0 in | Wt 236.0 lb

## 2013-04-19 DIAGNOSIS — F4310 Post-traumatic stress disorder, unspecified: Secondary | ICD-10-CM

## 2013-04-19 DIAGNOSIS — F339 Major depressive disorder, recurrent, unspecified: Secondary | ICD-10-CM

## 2013-04-19 MED ORDER — CLONAZEPAM 1 MG PO TABS: 1.0000 mg | ORAL_TABLET | Freq: Two times a day (BID) | ORAL | Status: DC

## 2013-04-19 MED ORDER — DULOXETINE HCL 30 MG PO CPEP: 30.0000 mg | ORAL_CAPSULE | Freq: Every day | ORAL | Status: DC
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
## 2013-05-01 ENCOUNTER — Ambulatory Visit (HOSPITAL_COMMUNITY): Payer: Self-pay | Admitting: Psychiatry

## 2013-05-15 ENCOUNTER — Other Ambulatory Visit: Payer: Self-pay | Admitting: Family Medicine

## 2013-05-26 ENCOUNTER — Ambulatory Visit: Payer: Self-pay | Admitting: Obstetrics & Gynecology

## 2013-06-01 ENCOUNTER — Ambulatory Visit (HOSPITAL_COMMUNITY): Payer: Self-pay | Admitting: Psychiatry

## 2013-06-09 ENCOUNTER — Encounter: Payer: Self-pay | Admitting: Family Medicine

## 2013-06-09 ENCOUNTER — Ambulatory Visit (INDEPENDENT_AMBULATORY_CARE_PROVIDER_SITE_OTHER): Payer: 59 | Admitting: Family Medicine

## 2013-06-09 VITALS — BP 104/65 | HR 73 | Temp 98.4°F | Ht 64.0 in | Wt 237.6 lb

## 2013-06-09 DIAGNOSIS — R3915 Urgency of urination: Secondary | ICD-10-CM

## 2013-06-09 DIAGNOSIS — N390 Urinary tract infection, site not specified: Secondary | ICD-10-CM

## 2013-06-09 LAB — POCT UA - MICROSCOPIC ONLY
Bacteria, U Microscopic: NEGATIVE
Casts, Ur, LPF, POC: NEGATIVE
Crystals, Ur, HPF, POC: NEGATIVE
Mucus, UA: NEGATIVE
RBC, urine, microscopic: NEGATIVE
Yeast, UA: NEGATIVE

## 2013-06-09 LAB — POCT URINALYSIS DIPSTICK
Bilirubin, UA: NEGATIVE
Blood, UA: NEGATIVE
Glucose, UA: NEGATIVE
Ketones, UA: NEGATIVE
Nitrite, UA: NEGATIVE
Protein, UA: NEGATIVE
Spec Grav, UA: >=1.03
Urobilinogen, UA: NEGATIVE
pH, UA: 5

## 2013-06-09 MED ORDER — CIPROFLOXACIN HCL 500 MG PO TABS: 500.0000 mg | ORAL_TABLET | Freq: Two times a day (BID) | ORAL | Status: DC

## 2013-06-09 NOTE — Patient Instructions (Signed)
Urinary Tract Infection  Urinary tract infections (UTIs) can develop anywhere along your urinary tract. Your urinary tract is your body's drainage system for removing wastes and extra water. Your urinary tract includes two kidneys, two ureters, a bladder, and a urethra. Your kidneys are a pair of bean-shaped organs. Each kidney is about the size of your fist. They are located below your ribs, one on each side of your spine.  CAUSES  Infections are caused by microbes, which are microscopic organisms, including fungi, viruses, and bacteria. These organisms are so small that they can only be seen through a microscope. Bacteria are the microbes that most commonly cause UTIs.  SYMPTOMS   Symptoms of UTIs may vary by age and gender of the patient and by the location of the infection. Symptoms in young women typically include a frequent and intense urge to urinate and a painful, burning feeling in the bladder or urethra during urination. Older women and men are more likely to be tired, shaky, and weak and have muscle aches and abdominal pain. A fever may mean the infection is in your kidneys. Other symptoms of a kidney infection include pain in your back or sides below the ribs, nausea, and vomiting.  DIAGNOSIS  To diagnose a UTI, your caregiver will ask you about your symptoms. Your caregiver also will ask to provide a urine sample. The urine sample will be tested for bacteria and white blood cells. White blood cells are made by your body to help fight infection.  TREATMENT   Typically, UTIs can be treated with medication. Because most UTIs are caused by a bacterial infection, they usually can be treated with the use of antibiotics. The choice of antibiotic and length of treatment depend on your symptoms and the type of bacteria causing your infection.  HOME CARE INSTRUCTIONS   If you were prescribed antibiotics, take them exactly as your caregiver instructs you. Finish the medication even if you feel better after you  have only taken some of the medication.   Drink enough water and fluids to keep your urine clear or pale yellow.   Avoid caffeine, tea, and carbonated beverages. They tend to irritate your bladder.   Empty your bladder often. Avoid holding urine for long periods of time.   Empty your bladder before and after sexual intercourse.   After a bowel movement, women should cleanse from front to back. Use each tissue only once.  SEEK MEDICAL CARE IF:    You have back pain.   You develop a fever.   Your symptoms do not begin to resolve within 3 days.  SEEK IMMEDIATE MEDICAL CARE IF:    You have severe back pain or lower abdominal pain.   You develop chills.   You have nausea or vomiting.   You have continued burning or discomfort with urination.  MAKE SURE YOU:    Understand these instructions.   Will watch your condition.   Will get help right away if you are not doing well or get worse.  Document Released: 02/11/2005 Document Revised: 11/03/2011 Document Reviewed: 06/12/2011  [employer] Patient Information 2014 [employer]

## 2013-06-09 NOTE — Progress Notes (Signed)
   Subjective:    Patient ID: Denise Ray, female    DOB: March 12, 1971, 43 y.o.   MRN: [id number]  HPI This 43 y.o. female presents for evaluation of back pain, dysuria, and frequency. She also is c/o of lower back pain   Review of Systems C/o back pain and dysuria No chest pain, SOB, HA, dizziness, vision change, N/V, diarrhea, constipation, dysuria, urinary urgency or frequency, myalgias, arthralgias or rash.     Objective:   Physical Exam Vital signs noted  Well developed well nourished female.  HEENT - Head atraumatic Normocephalic                Eyes - PERRLA, Conjuctiva - clear Sclera- Clear EOMI                Ears - EAC's Wnl TM's Wnl Gross Hearing WNL Respiratory - Lungs CTA bilateral Cardiac - RRR S1 and S2 without murmur MS - Neg cva tenderness and TTP right LS muscles.   Results for orders placed in visit on 06/09/13  POCT URINALYSIS DIPSTICK      Result Value Range   Color, UA yellow     Clarity, UA clear     Glucose, UA neg     Bilirubin, UA neg     Ketones, UA neg     Spec Grav, UA >=1.030     Blood, UA neg     pH, UA 5.0     Protein, UA neg     Urobilinogen, UA negative     Nitrite, UA neg     Leukocytes, UA Trace    POCT UA - MICROSCOPIC ONLY      Result Value Range   WBC, Ur, HPF, POC 1-5     RBC, urine, microscopic neg     Bacteria, U Microscopic neg     Mucus, UA neg     Epithelial cells, urine per micros occ     Crystals, Ur, HPF, POC neg     Casts, Ur, LPF, POC neg     Yeast, UA neg        Assessment & Plan:  Urinary urgency - Plan: POCT urinalysis dipstick, POCT UA - Microscopic Only, ciprofloxacin (CIPRO) 500 MG tablet  UTI (lower urinary tract infection) - Plan: ciprofloxacin (CIPRO) 500 MG tablet  Lysbeth Penner FNP

## 2013-09-11 ENCOUNTER — Telehealth (HOSPITAL_COMMUNITY): Payer: Self-pay | Admitting: *Deleted

## 2013-09-11 NOTE — Telephone Encounter (Signed)
Pt has not been seen since Dec 2014

## 2013-09-25 ENCOUNTER — Encounter (HOSPITAL_COMMUNITY): Payer: Self-pay | Admitting: Psychiatry

## 2013-09-25 NOTE — Progress Notes (Signed)
Outpatient Therapist Discharge Summary  Denise Ray    1971-01-18   Admission Date: 01/30/2013 Discharge Date:  09/25/2013 Reason for Discharge:  Patient discontinued treatment Diagnosis:  Axis I:  Depressive Disorder, Anxiety Disorder   [name]         [name], LCSW

## 2013-10-16 DIAGNOSIS — Z8701 Personal history of pneumonia (recurrent): Secondary | ICD-10-CM

## 2013-10-16 HISTORY — DX: Personal history of pneumonia (recurrent): Z87.01

## 2013-10-26 ENCOUNTER — Encounter (HOSPITAL_COMMUNITY): Payer: Self-pay | Admitting: Emergency Medicine

## 2013-10-26 ENCOUNTER — Inpatient Hospital Stay (HOSPITAL_COMMUNITY)
Admission: EM | Admit: 2013-10-26 | Discharge: 2013-10-27 | DRG: 194 | Disposition: A | Discharge status: Home / Self Care | Payer: 59 | Attending: Family Medicine | Admitting: Family Medicine

## 2013-10-26 ENCOUNTER — Ambulatory Visit: Payer: 59 | Admitting: General Practice

## 2013-10-26 ENCOUNTER — Emergency Department (HOSPITAL_COMMUNITY): Payer: 59

## 2013-10-26 DIAGNOSIS — F4310 Post-traumatic stress disorder, unspecified: Secondary | ICD-10-CM | POA: Diagnosis present

## 2013-10-26 DIAGNOSIS — Z803 Family history of malignant neoplasm of breast: Secondary | ICD-10-CM

## 2013-10-26 DIAGNOSIS — E669 Obesity, unspecified: Secondary | ICD-10-CM | POA: Diagnosis present

## 2013-10-26 DIAGNOSIS — J4489 Other specified chronic obstructive pulmonary disease: Secondary | ICD-10-CM | POA: Insufficient documentation

## 2013-10-26 DIAGNOSIS — E039 Hypothyroidism, unspecified: Secondary | ICD-10-CM | POA: Diagnosis present

## 2013-10-26 DIAGNOSIS — Z885 Allergy status to narcotic agent status: Secondary | ICD-10-CM

## 2013-10-26 DIAGNOSIS — J189 Pneumonia, unspecified organism: Principal | ICD-10-CM | POA: Diagnosis present

## 2013-10-26 DIAGNOSIS — Z888 Allergy status to other drugs, medicaments and biological substances status: Secondary | ICD-10-CM

## 2013-10-26 DIAGNOSIS — F17200 Nicotine dependence, unspecified, uncomplicated: Secondary | ICD-10-CM | POA: Diagnosis present

## 2013-10-26 DIAGNOSIS — Z818 Family history of other mental and behavioral disorders: Secondary | ICD-10-CM

## 2013-10-26 DIAGNOSIS — R059 Cough, unspecified: Secondary | ICD-10-CM

## 2013-10-26 DIAGNOSIS — Z8541 Personal history of malignant neoplasm of cervix uteri: Secondary | ICD-10-CM

## 2013-10-26 DIAGNOSIS — Z79899 Other long term (current) drug therapy: Secondary | ICD-10-CM

## 2013-10-26 DIAGNOSIS — J449 Chronic obstructive pulmonary disease, unspecified: Secondary | ICD-10-CM | POA: Insufficient documentation

## 2013-10-26 DIAGNOSIS — Z8701 Personal history of pneumonia (recurrent): Secondary | ICD-10-CM | POA: Diagnosis present

## 2013-10-26 DIAGNOSIS — K219 Gastro-esophageal reflux disease without esophagitis: Secondary | ICD-10-CM | POA: Diagnosis present

## 2013-10-26 DIAGNOSIS — R05 Cough: Secondary | ICD-10-CM

## 2013-10-26 DIAGNOSIS — E876 Hypokalemia: Secondary | ICD-10-CM | POA: Diagnosis present

## 2013-10-26 DIAGNOSIS — Z6841 Body Mass Index (BMI) 40.0 and over, adult: Secondary | ICD-10-CM

## 2013-10-26 DIAGNOSIS — IMO0002 Reserved for concepts with insufficient information to code with codable children: Secondary | ICD-10-CM

## 2013-10-26 DIAGNOSIS — F339 Major depressive disorder, recurrent, unspecified: Secondary | ICD-10-CM

## 2013-10-26 DIAGNOSIS — Z8673 Personal history of transient ischemic attack (TIA), and cerebral infarction without residual deficits: Secondary | ICD-10-CM

## 2013-10-26 DIAGNOSIS — Z91199 Patient's noncompliance with other medical treatment and regimen due to unspecified reason: Secondary | ICD-10-CM

## 2013-10-26 DIAGNOSIS — Z6379 Other stressful life events affecting family and household: Secondary | ICD-10-CM

## 2013-10-26 DIAGNOSIS — Z9119 Patient's noncompliance with other medical treatment and regimen: Secondary | ICD-10-CM

## 2013-10-26 DIAGNOSIS — J45901 Unspecified asthma with (acute) exacerbation: Secondary | ICD-10-CM | POA: Diagnosis present

## 2013-10-26 LAB — CBC WITH DIFFERENTIAL/PLATELET
Basophils Absolute: 0.1 10*3/uL (ref 0.0–0.1)
Basophils Relative: 1 % (ref 0–1)
Eosinophils Absolute: 0.2 10*3/uL (ref 0.0–0.7)
Eosinophils Relative: 2 % (ref 0–5)
HCT: 42.2 % (ref 36.0–46.0)
HEMOGLOBIN: 14.3 g/dL (ref 12.0–15.0)
Lymphocytes Relative: 25 % (ref 12–46)
Lymphs Abs: 2.4 10*3/uL (ref 0.7–4.0)
MCH: 29.5 pg (ref 26.0–34.0)
MCHC: 33.9 g/dL (ref 30.0–36.0)
MCV: 87 fL (ref 78.0–100.0)
MONO ABS: 0.6 10*3/uL (ref 0.1–1.0)
Monocytes Relative: 7 % (ref 3–12)
NEUTROS ABS: 6.2 10*3/uL (ref 1.7–7.7)
Neutrophils Relative %: 65 % (ref 43–77)
Platelets: 289 10*3/uL (ref 150–400)
RBC: 4.85 MIL/uL (ref 3.87–5.11)
RDW: 13.6 % (ref 11.5–15.5)
WBC: 9.4 10*3/uL (ref 4.0–10.5)

## 2013-10-26 LAB — BASIC METABOLIC PANEL
BUN: 5 mg/dL — ABNORMAL LOW (ref 6–23)
CHLORIDE: 102 meq/L (ref 96–112)
CO2: 20 meq/L (ref 19–32)
CREATININE: 0.64 mg/dL (ref 0.50–1.10)
Calcium: 9.6 mg/dL (ref 8.4–10.5)
GFR calc Af Amer: >90 mL/min (ref >90)
GFR calc non Af Amer: >90 mL/min (ref >90)
Glucose, Bld: 116 mg/dL — ABNORMAL HIGH (ref 70–99)
Potassium: 3.2 mEq/L — ABNORMAL LOW (ref 3.7–5.3)
Sodium: 140 mEq/L (ref 137–147)

## 2013-10-26 LAB — STREP PNEUMONIAE URINARY ANTIGEN: Strep Pneumo Urinary Antigen: NEGATIVE

## 2013-10-26 LAB — TROPONIN I: Troponin I: <0.3 ng/mL (ref <0.30)

## 2013-10-26 LAB — PRO B NATRIURETIC PEPTIDE: Pro B Natriuretic peptide (BNP): 95.1 pg/mL (ref 0–125)

## 2013-10-26 IMAGING — CR DG CHEST 2V
2 series · 2 of 2 positions shown · non-contrast
Comparison: [DATE].

CLINICAL DATA: Chest pain for 6 days. Hypothyroidism. Asthma.
Shortness of breath.

EXAM:
CHEST  2 VIEW

[w chest pa]
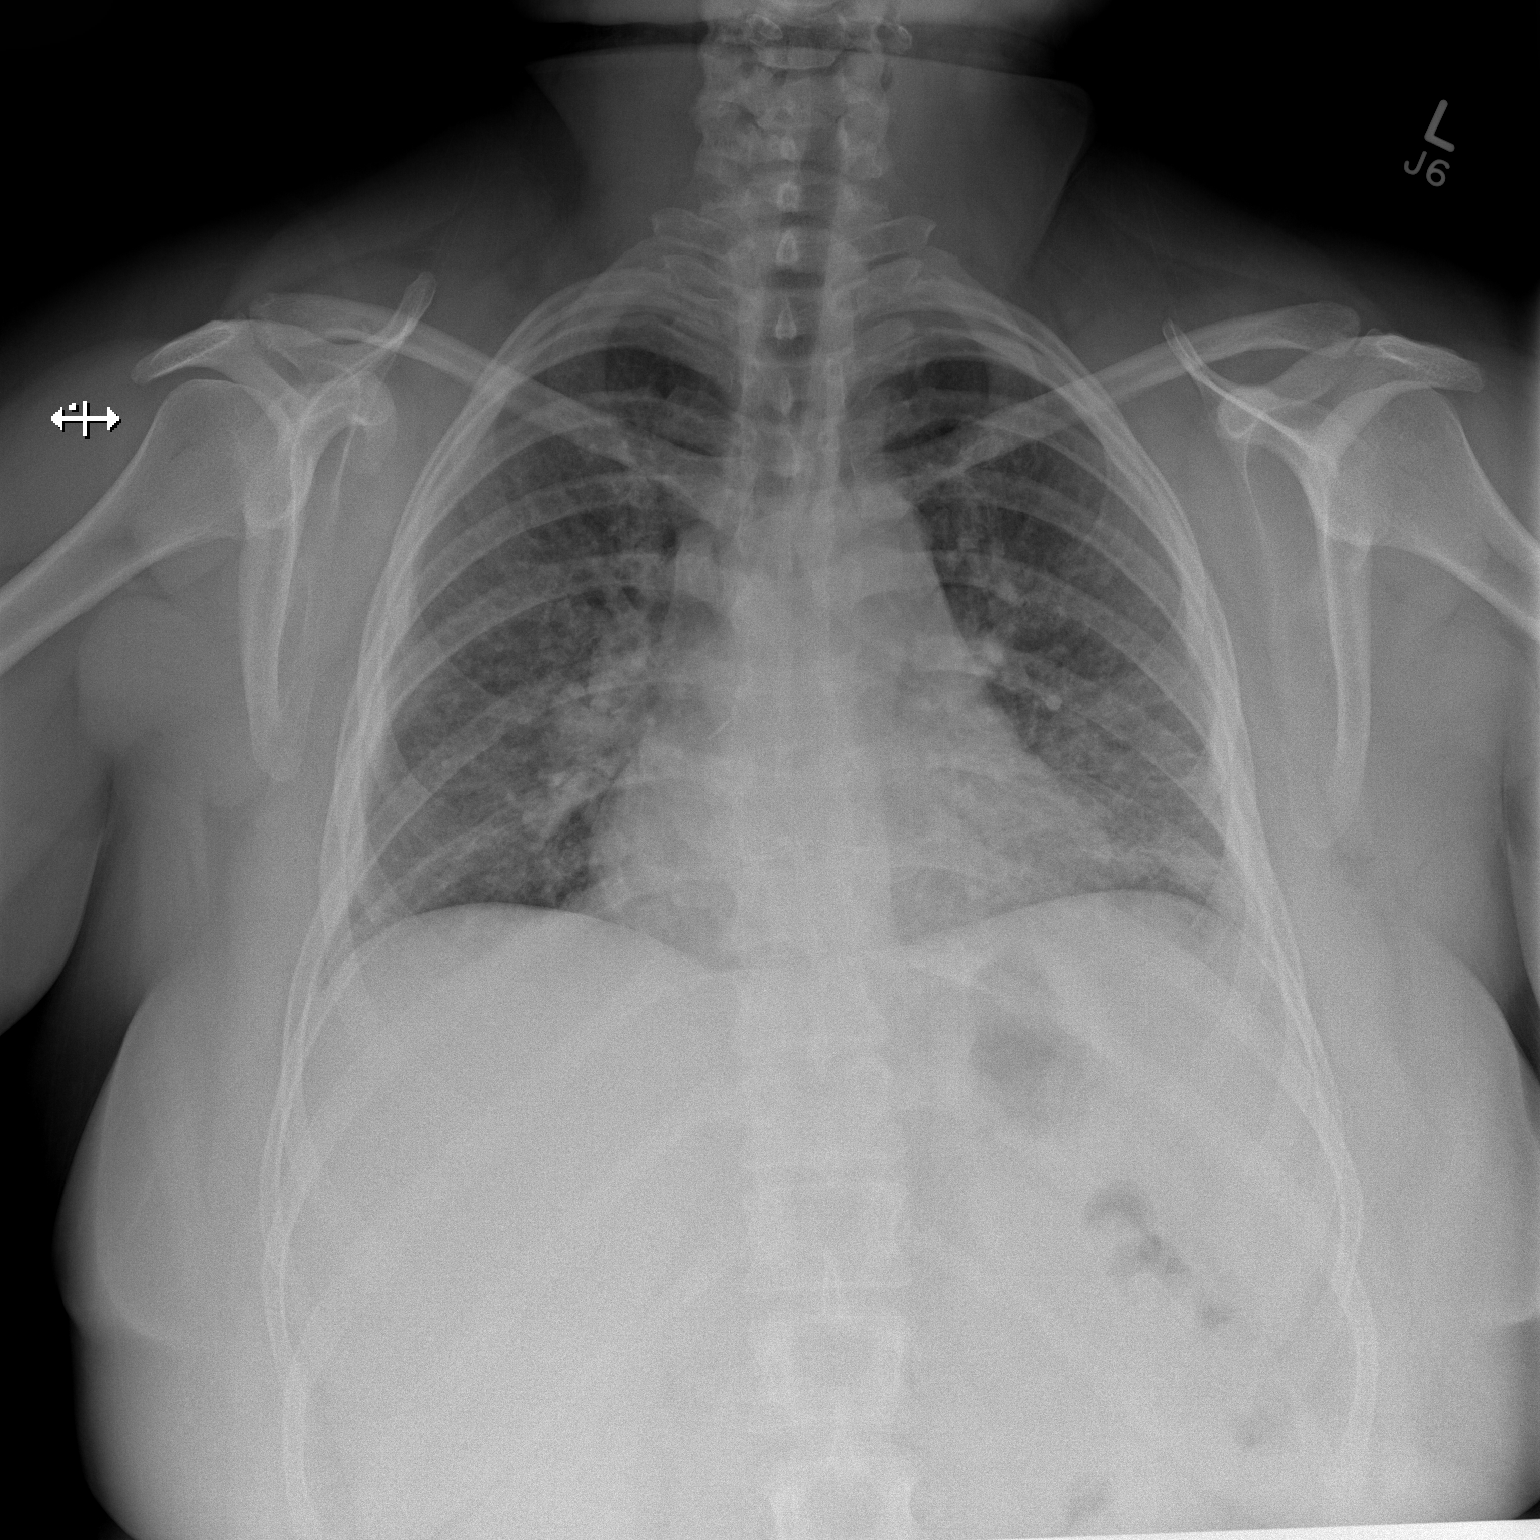

[w chest lat]
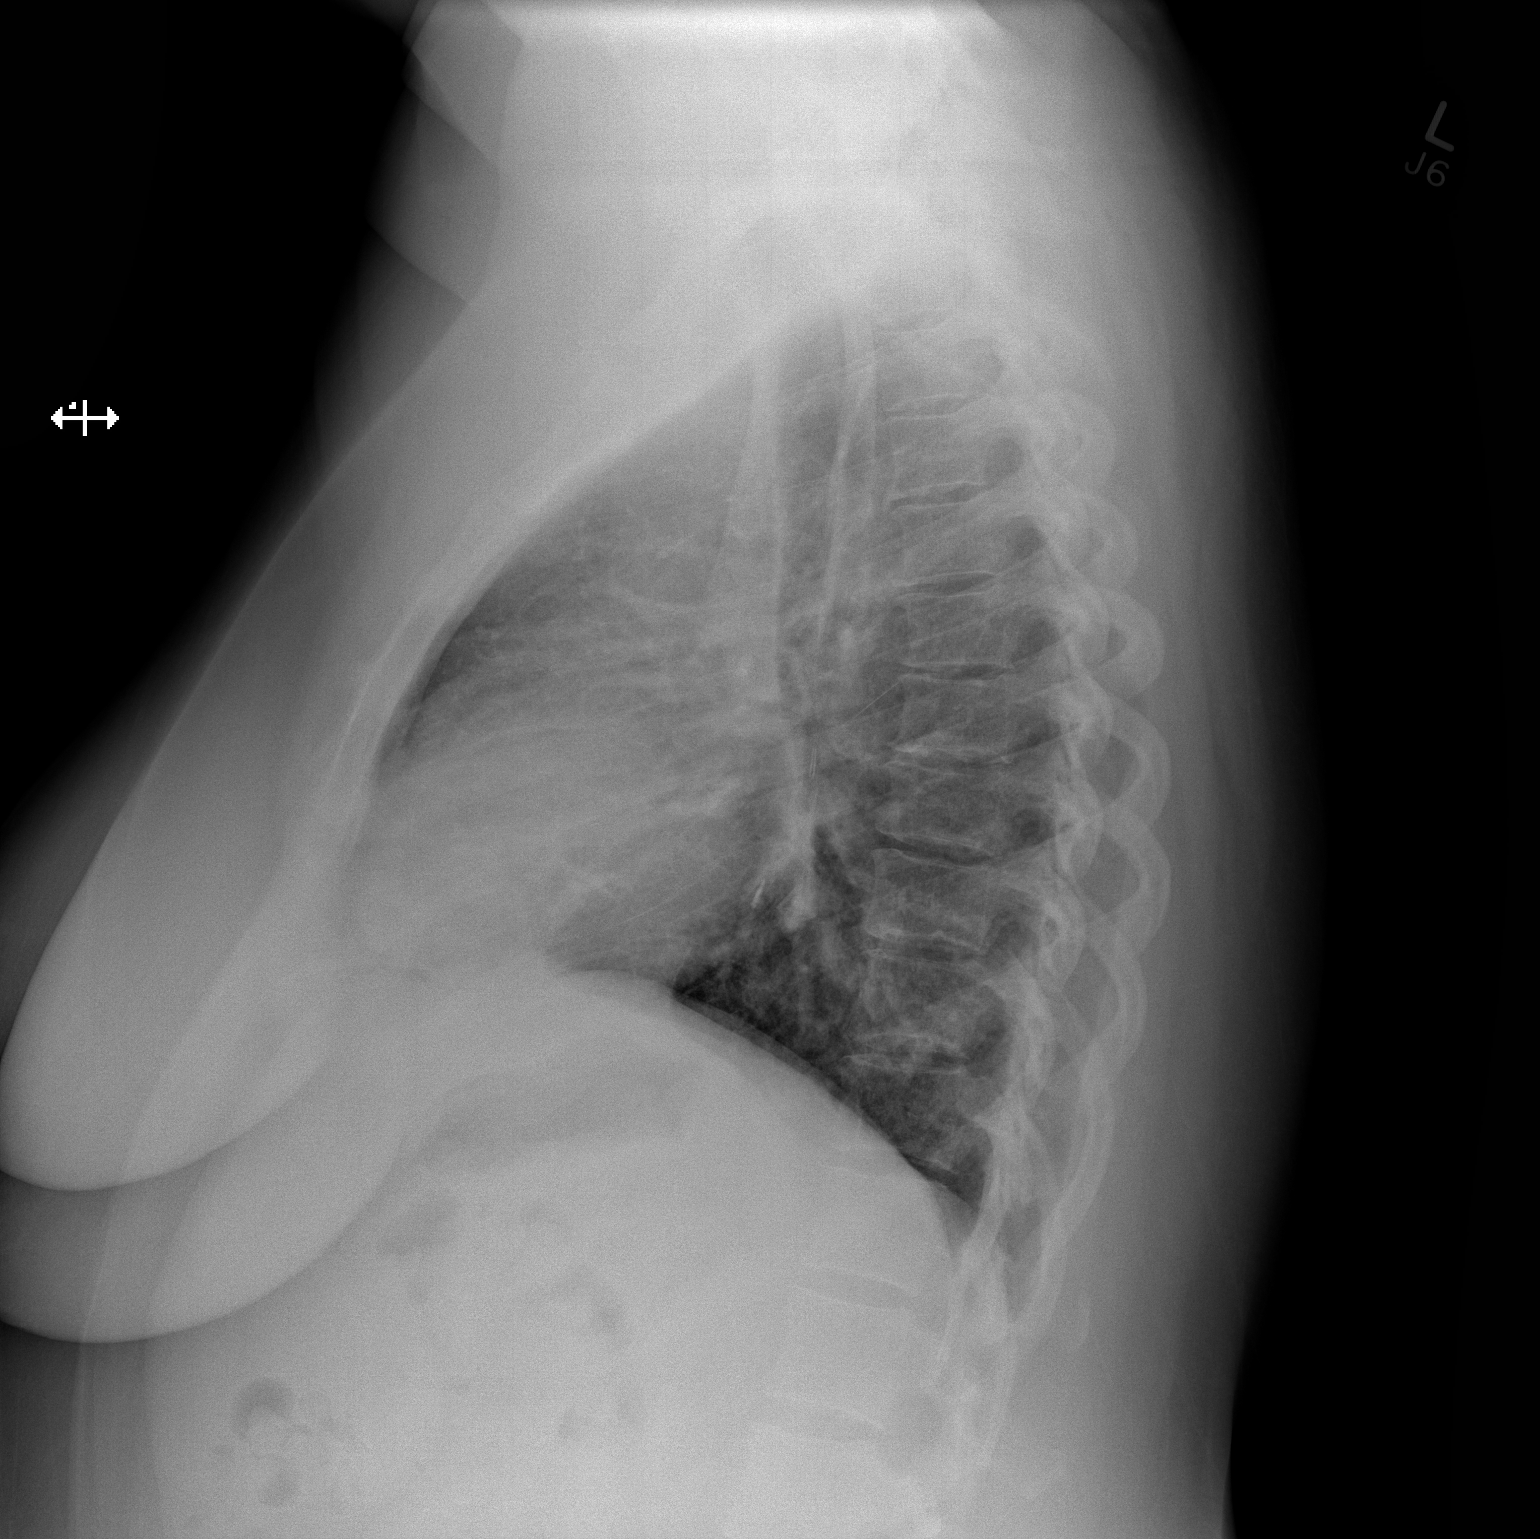

[2 of 2 positions shown; findings below may reference images not displayed]

FINDINGS: Lung volumes are lower than on prior. There is interstitial and mild
alveolar opacity, most compatible with pulmonary edema. Airspace
disease is slightly asymmetric, greater on the left than right. Left
basilar atelectasis is present. Multifocal pneumonia is considered
less likely. There is no pleural effusion. Surgical clips project
over the right infrahilar region.
IMPRESSION: Diffuse interstitial and mild alveolar opacity favored to represent
pulmonary edema over multifocal pneumonia. Low volume chest.

## 2013-10-26 MED ORDER — METHYLPREDNISOLONE SODIUM SUCC 125 MG IJ SOLR
60.0000 mg | Freq: Two times a day (BID) | INTRAMUSCULAR | Status: DC
  Administered 2013-10-26 – 2013-10-27 (×2): 60 mg via INTRAVENOUS
  Filled 2013-10-26 (×3): qty 0.96

## 2013-10-26 MED ORDER — METHYLPREDNISOLONE SODIUM SUCC 125 MG IJ SOLR
125.0000 mg | Freq: Once | INTRAMUSCULAR | Status: AC
  Administered 2013-10-26: 125 mg via INTRAVENOUS
  Filled 2013-10-26: qty 2

## 2013-10-26 MED ORDER — ALPRAZOLAM 0.5 MG PO TABS
0.5000 mg | ORAL_TABLET | Freq: Once | ORAL | Status: AC
  Administered 2013-10-26: 0.5 mg via ORAL
  Filled 2013-10-26: qty 1

## 2013-10-26 MED ORDER — ALBUTEROL (5 MG/ML) CONTINUOUS INHALATION SOLN
10.0000 mg/h | INHALATION_SOLUTION | RESPIRATORY_TRACT | Status: DC
  Administered 2013-10-26: 10 mg/h via RESPIRATORY_TRACT
  Filled 2013-10-26: qty 20

## 2013-10-26 MED ORDER — DULOXETINE HCL 30 MG PO CPEP
30.0000 mg | ORAL_CAPSULE | Freq: Every day | ORAL | Status: DC
  Filled 2013-10-26: qty 1

## 2013-10-26 MED ORDER — ALBUTEROL (5 MG/ML) CONTINUOUS INHALATION SOLN
10.0000 mg/h | INHALATION_SOLUTION | RESPIRATORY_TRACT | Status: DC
  Administered 2013-10-26: 10 mg/h via RESPIRATORY_TRACT

## 2013-10-26 MED ORDER — HYDROCODONE-ACETAMINOPHEN 5-325 MG PO TABS: 2.0000 | ORAL_TABLET | Freq: Once | ORAL | Status: DC

## 2013-10-26 MED ORDER — ALPRAZOLAM 1 MG PO TABS
1.0000 mg | ORAL_TABLET | Freq: Two times a day (BID) | ORAL | Status: DC | PRN
  Administered 2013-10-26 – 2013-10-27 (×2): 1 mg via ORAL
  Filled 2013-10-26 (×2): qty 1

## 2013-10-26 MED ORDER — IPRATROPIUM-ALBUTEROL 0.5-2.5 (3) MG/3ML IN SOLN: 3.0000 mL | Freq: Once | RESPIRATORY_TRACT | Status: DC

## 2013-10-26 MED ORDER — IPRATROPIUM-ALBUTEROL 0.5-2.5 (3) MG/3ML IN SOLN
3.0000 mL | RESPIRATORY_TRACT | Status: DC
  Administered 2013-10-26 – 2013-10-27 (×5): 3 mL via RESPIRATORY_TRACT
  Filled 2013-10-26 (×5): qty 3

## 2013-10-26 MED ORDER — HYDROCODONE-ACETAMINOPHEN 5-325 MG PO TABS
1.0000 | ORAL_TABLET | Freq: Once | ORAL | Status: AC
  Administered 2013-10-26: 1 via ORAL
  Filled 2013-10-26: qty 1

## 2013-10-26 MED ORDER — CLONAZEPAM 1 MG PO TABS: 1.0000 mg | ORAL_TABLET | Freq: Two times a day (BID) | ORAL | Status: DC

## 2013-10-26 MED ORDER — POTASSIUM CHLORIDE CRYS ER 20 MEQ PO TBCR
40.0000 meq | EXTENDED_RELEASE_TABLET | Freq: Once | ORAL | Status: AC
  Administered 2013-10-26: 40 meq via ORAL
  Filled 2013-10-26: qty 2

## 2013-10-26 MED ORDER — DEXTROSE 5 % IV SOLN
1.0000 g | INTRAVENOUS | Status: DC
  Filled 2013-10-26: qty 10

## 2013-10-26 MED ORDER — IPRATROPIUM-ALBUTEROL 0.5-2.5 (3) MG/3ML IN SOLN: 3.0000 mL | RESPIRATORY_TRACT | Status: DC

## 2013-10-26 MED ORDER — LEVOTHYROXINE SODIUM 50 MCG PO TABS
50.0000 ug | ORAL_TABLET | Freq: Every day | ORAL | Status: DC
  Administered 2013-10-27: 50 ug via ORAL
  Filled 2013-10-26 (×2): qty 1

## 2013-10-26 MED ORDER — AZITHROMYCIN 500 MG PO TABS
500.0000 mg | ORAL_TABLET | ORAL | Status: DC
  Filled 2013-10-26: qty 1

## 2013-10-26 MED ORDER — ONDANSETRON HCL 4 MG/2ML IJ SOLN: 4.0000 mg | Freq: Three times a day (TID) | INTRAMUSCULAR | Status: DC | PRN

## 2013-10-26 MED ORDER — IBUPROFEN 800 MG PO TABS
400.0000 mg | ORAL_TABLET | ORAL | Status: DC | PRN
  Administered 2013-10-27: 400 mg via ORAL
  Filled 2013-10-26: qty 0.5
  Filled 2013-10-26: qty 1

## 2013-10-26 MED ORDER — ENOXAPARIN SODIUM 40 MG/0.4ML ~~LOC~~ SOLN
40.0000 mg | SUBCUTANEOUS | Status: DC
  Filled 2013-10-26 (×2): qty 0.4

## 2013-10-26 MED ORDER — PANTOPRAZOLE SODIUM 40 MG PO TBEC
40.0000 mg | DELAYED_RELEASE_TABLET | Freq: Every day | ORAL | Status: DC
  Administered 2013-10-26 – 2013-10-27 (×2): 40 mg via ORAL
  Filled 2013-10-26 (×2): qty 1

## 2013-10-26 MED ORDER — ALBUTEROL SULFATE (2.5 MG/3ML) 0.083% IN NEBU
2.5000 mg | INHALATION_SOLUTION | RESPIRATORY_TRACT | Status: DC | PRN
  Administered 2013-10-26: 2.5 mg via RESPIRATORY_TRACT
  Filled 2013-10-26: qty 3

## 2013-10-26 MED ORDER — DEXTROSE 5 % IV SOLN
500.0000 mg | Freq: Once | INTRAVENOUS | Status: AC
  Administered 2013-10-26: 500 mg via INTRAVENOUS
  Filled 2013-10-26 (×2): qty 500

## 2013-10-26 MED ORDER — ALBUTEROL SULFATE (2.5 MG/3ML) 0.083% IN NEBU: 5.0000 mg | INHALATION_SOLUTION | RESPIRATORY_TRACT | Status: DC | PRN

## 2013-10-26 MED ORDER — ONDANSETRON HCL 4 MG/2ML IJ SOLN
4.0000 mg | Freq: Once | INTRAMUSCULAR | Status: AC
  Administered 2013-10-26: 4 mg via INTRAVENOUS
  Filled 2013-10-26: qty 2

## 2013-10-26 MED ORDER — HYDROCODONE-ACETAMINOPHEN 5-325 MG PO TABS
1.0000 | ORAL_TABLET | Freq: Four times a day (QID) | ORAL | Status: DC | PRN
  Administered 2013-10-26 – 2013-10-27 (×3): 1 via ORAL
  Filled 2013-10-26 (×3): qty 1

## 2013-10-26 MED ORDER — DEXTROSE 5 % IV SOLN
1.0000 g | Freq: Once | INTRAVENOUS | Status: AC
  Administered 2013-10-26: 1 g via INTRAVENOUS
  Filled 2013-10-26: qty 10

## 2013-10-26 NOTE — ED Notes (Signed)
Per pt, states SOB for 4 days, states worse 2 days ago-history of asthma-wheezing, no relief with rescue inhaler and neb treatments

## 2013-10-26 NOTE — H&P (Signed)
History and Physical:    Denise Ray [id number] DOB: 10/30/1970 DOA: 10/26/2013  Referring provider: Quincy Carnes, PA-C PCP: Redge Gainer, MD   Chief Complaint: "I couldn't breathe"  History of Present Illness:   Denise Ray is an 43 y.o. female with a PMH of asthma managed with a rescue inhaler (uses only 3 times a year), hypothyroidism, ongoing light tobacco use, who presents with a 6 day history of chest tightness, 4 day history of cough, productive of yellow/beige sputum, associated with intermittent low grade fevers and chills.  Has been using her rescue inhaler and nebulized bronchodilator therapy without significant improvement.  Has also had pleuritic type pain in her back and chest with deep inspiration.  Exertion makes symptoms worse.  Pain medication administered in the ER helped her pleuritic pain.  Upon initial evaluation in the ER, she was noted to have a chest x-ray with findings consistent with multifocal pneumonia versus pulmonary edema.  ROS:   Constitutional: + low grade fever, + chills;  Appetite diminished; No weight loss, no weight gain, + fatigue.  HEENT: No blurry vision, no diplopia, no pharyngitis, no dysphagia CV: No chest pain, no palpitations, no PND, no orthopnea, no edema.  Resp: + SOB, + cough, + pleuritic pain. GI: + nausea, no vomiting, no diarrhea, no melena, no hematochezia, no constipation, no abdominal pain.  GU: No dysuria, no hematuria, no frequency, no urgency. MSK: no myalgias, + diffuse arthralgias.  Neuro:  + chronic headache with history of migraine and cluster headaches, no focal neurological deficits, no history of seizures.  Psych: + depression, +PTSD.  Endo: No heat intolerance, no cold intolerance, no polyuria, no polydipsia  Skin: No rashes, no skin lesions.  Heme: No easy bruising.  Travel history: [place].   Past Medical History:   Past Medical History  Diagnosis Date  . History of abnormal cervical Pap smear   .  Hypothyroidism        . Asthma     seasonal- rare inhaler use  . Anxiety     no meds  . Depression     no meds  . TIA (transient ischemic attack)     from BCP  . GERD (gastroesophageal reflux disease)   . PTSD (post-traumatic stress disorder)   . History of attempted suicide   . Cervical cancer     Past Surgical History:   Past Surgical History  Procedure Laterality Date  . Conization of cervix  2000  . Hysteroscopy w/d&c  4/09  . Chest wall/lung mass resection  7/03  . Y catheter    . Robotic assisted total hysterectomy N/A 10/11/2012    Procedure: ROBOTIC ASSISTED TOTAL HYSTERECTOMY;  Surgeon: Lyman Speller, MD;  Location: [facility];  Service: Gynecology;  Laterality: N/A;  . Salpingoophorectomy Bilateral 10/11/2012    Procedure: SALPINGO OOPHORECTOMY;  Surgeon: Lyman Speller, MD;  Location: [facility];  Service: Gynecology;  Laterality: Bilateral;  . Dilation and curettage of uterus    . Hysteroscopy  08/29/2003  . Abdominal hysterectomy    . Video assisted thoracoscopy (vats)/thorocotomy      Social History:   History   Social History  . Marital Status: Divorced    Spouse Name: N/A    Number of Children: 1  . Years of Education: N/A   Occupational History  . Respiratory Therapist [employer]   Social History Main Topics  . Smoking status: Light Tobacco Smoker -- 1.00 packs/day  Types: Cigarettes  . Smokeless tobacco: Never Used  . Alcohol Use: No  . Drug Use: No  . Sexual Activity: Yes   Other Topics Concern  . Not on file   Social History Narrative   Divorced.  Mother and son live with her.    Family history:   Family History  Problem Relation Age of Onset  . Breast cancer Brother 15  . Breast cancer Maternal Aunt   . Depression Mother   . Alcohol abuse Father   . Alcohol abuse Cousin     Allergies   Estrogens; Morphine and related; Tamiflu; and Wellbutrin  Current Medications:   Prior to Admission medications     Medication Sig Start Date End Date Taking? Authorizing Provider  albuterol (PROVENTIL HFA;VENTOLIN HFA) 108 (90 BASE) MCG/ACT inhaler Inhale 2 puffs into the lungs every 6 (six) hours as needed. wheezing 01/06/13  Yes Lysbeth Penner, FNP  albuterol (PROVENTIL) (5 MG/ML) 0.5% nebulizer solution Take 2.5 mg by nebulization every 6 (six) hours as needed for wheezing or shortness of breath.   Yes Historical Provider, MD  clonazePAM (KLONOPIN) 1 MG tablet Take 1 tablet (1 mg total) by mouth 2 (two) times daily. 04/19/13 04/19/14 Yes Levonne Spiller, MD  DULoxetine (CYMBALTA) 30 MG capsule Take 1 capsule (30 mg total) by mouth daily. 04/19/13 04/19/14 Yes Levonne Spiller, MD  ibuprofen (ADVIL,MOTRIN) 400 MG tablet Take 400 mg by mouth as needed for pain.   Yes Historical Provider, MD  levothyroxine (SYNTHROID, LEVOTHROID) 50 MCG tablet Take 50 mcg by mouth daily before breakfast.   Yes Historical Provider, MD  pantoprazole (PROTONIX) 40 MG tablet Take 40 mg by mouth daily.   Yes Historical Provider, MD    Physical Exam:   Filed Vitals:   10/26/13 1418 10/26/13 1457 10/26/13 1500 10/26/13 1650  BP:  134/70  115/71  Pulse:  94  93  Temp:    97.6 F (36.4 C)  TempSrc:    Oral  Resp:  15  15  Height:   5\' 4"  (1.626 m)   Weight:   107.593 kg (237 lb 3.2 oz)   SpO2: 98% 94%  96%     Physical Exam: Blood pressure 115/71, pulse 93, temperature 97.6 F (36.4 C), temperature source Oral, resp. rate 15, height 5\' 4"  (1.626 m), weight 107.593 kg (237 lb 3.2 oz), SpO2 96.00%. Gen: No acute distress. Head: Normocephalic, atraumatic. Eyes: PERRL, EOMI, sclerae nonicteric. Mouth: Oropharynx clear with moist mucous membranes. Neck: Supple, no thyromegaly, no lymphadenopathy, no jugular venous distention. Chest: Lungs lungs diminished with decreased air movement and a few high pitched wheezes. CV: Heart sounds are regular. No murmurs, rubs, or gallops Abdomen: Soft, nontender, nondistended with normal active  bowel sounds. Extremities: Extremities are with 1+ edema bilaterally. Skin: Warm and dry. Neuro: Alert and oriented times 3; cranial nerves II through XII grossly intact. Psych: Mood and affect normal.   Data Review:    Labs: Basic Metabolic Panel:  Recent Labs Lab 10/26/13 1409  NA 140  K 3.2*  CL 102  CO2 20  GLUCOSE 116*  BUN 5*  CREATININE 0.64  CALCIUM 9.6   CBC:  Recent Labs Lab 10/26/13 1409  WBC 9.4  NEUTROABS 6.2  HGB 14.3  HCT 42.2  MCV 87.0  PLT 289   Cardiac Enzymes:  Recent Labs Lab 10/26/13 1409  TROPONINI <0.30    BNP (last 3 results)  Recent Labs  10/26/13 1409  PROBNP 95.1  Radiographic Studies: Dg Chest 2 View  10/26/2013   CLINICAL DATA:  Chest pain for 6 days. Hypothyroidism. Asthma. Shortness of breath.  EXAM: CHEST  2 VIEW  COMPARISON:  01/06/2013.  FINDINGS: Lung volumes are lower than on prior. There is interstitial and mild alveolar opacity, most compatible with pulmonary edema. Airspace disease is slightly asymmetric, greater on the left than right. Left basilar atelectasis is present. Multifocal pneumonia is considered less likely. There is no pleural effusion. Surgical clips project over the right infrahilar region.  IMPRESSION: Diffuse interstitial and mild alveolar opacity favored to represent pulmonary edema over multifocal pneumonia. Low volume chest.   Electronically Signed   By: Dereck Ligas M.D.   On: 10/26/2013 13:45    Assessment/Plan:   Principal Problem: Atypical community-acquired pneumonia  Obtain sputum cultures, blood cultures, strep pneumonia and urinary Legionella antigens as well as HIV.  Treat with empiric Rocephin and azithromycin.   Active Problems: Asthma with acute exacerbation  Duonebs every 6 hours with albuterol every 2 hours when necessary.  Solu-Medrol 40 mg IV every 12 hours. Wean as tolerated.    Unspecified hypothyroidism  Has been noncompliant with Synthroid, so would not check  TSH since it is likely to be elevated.  Resume prior dose of Synthroid.  Hypokalemia  We'll give 40 mEq of oral potassium today.  Obesity, unspecified  DVT prophylaxis  Lovenox ordered.   Code Status: Full. Family Communication: Mother, [place] [phone]. Disposition Plan: Home when stable.  Time spent: 1 hour.  Adria Costley [facility] Pager [phone] Cell: [phone]   If 7PM-7AM, please contact night-coverage [email or web address] Password [facility] 10/26/2013, 5:23 PM    **Disclaimer: This note was dictated with voice recognition software. Similar sounding words can inadvertently be transcribed and this note may contain transcription errors which may not have been corrected upon publication of note.**

## 2013-10-26 NOTE — ED Notes (Signed)
RT called

## 2013-10-26 NOTE — ED Provider Notes (Signed)
Patient presented to the ER with shortness of breath. Patient has a history of asthma. For the last 2 days she has had increased wheezing and difficulty breathing. She is not any relief with albuterol.  Face to face Exam: HEENT - PERRLA Lungs - bilateral decreased breath sounds with wheezing Heart - RRR, no M/R/G Abd - S/NT/ND Neuro - alert, oriented x3  Plan: Chest x-ray shows increased interstitial markings. This was considered possibly related to viral load, but BNP is negative. I suspect atypical pneumonia. Patient to be admitted for further management.   Orpah Greek, MD 10/26/13 1556

## 2013-10-26 NOTE — ED Provider Notes (Signed)
CSN: [id number]     Arrival date & time 10/26/13  1147 History   First MD Initiated Contact with Patient 10/26/13 1206     Chief Complaint  Patient presents with  . Asthma  . Shortness of Breath     (Consider location/radiation/quality/duration/timing/severity/associated sxs/prior Treatment) The history is provided by the patient and medical records.   This is a 43 y.o. F with PMH significant for hypothyroidism, anxiety, asthma, depression, TIA's, GERD, PTSD, presenting to the ED for asthma exacerbation and SOB x 4 days.  Patient states symptoms significantly worsened over the past 2 days. She endorses associated nonproductive cough and intermittent low-grade fever. She's not on any daily maintenance medications has been using her albuterol rescue inhaler and nebulizer treatments every 4 hours without noted improvement.  She endorses some chest tightness which is typical with her asthma exacerbations, localized mainly to her lower rib cage.  Denies palpitations, dizziness, lightheadedness, weakness, pain of upper extremities or neck, nausea, or vomiting.  Asthma has required hospitalizations in the past, but never intubation.  Past Medical History  Diagnosis Date  . History of abnormal cervical Pap smear   . Hypothyroidism        . Asthma     seasonal- rare inhaler use  . Anxiety     no meds  . Depression     no meds  . TIA (transient ischemic attack)     from BCP  . GERD (gastroesophageal reflux disease)   . PTSD (post-traumatic stress disorder)   . History of attempted suicide    Past Surgical History  Procedure Laterality Date  . Conization of cervix  2000  . Hysteroscopy w/d&c  4/09  . Chest wall/lung mass resection  7/03  . Y catheter    . Robotic assisted total hysterectomy N/A 10/11/2012    Procedure: ROBOTIC ASSISTED TOTAL HYSTERECTOMY;  Surgeon: Lyman Speller, MD;  Location: [facility];  Service: Gynecology;  Laterality: N/A;  . Salpingoophorectomy Bilateral  10/11/2012    Procedure: SALPINGO OOPHORECTOMY;  Surgeon: Lyman Speller, MD;  Location: [facility];  Service: Gynecology;  Laterality: Bilateral;  . Dilation and curettage of uterus    . Hysteroscopy  08/29/2003  . Abdominal hysterectomy     Family History  Problem Relation Age of Onset  . Breast cancer Brother 15  . Breast cancer Maternal Aunt   . Depression Mother   . Alcohol abuse Father   . Alcohol abuse Cousin    History  Substance Use Topics  . Smoking status: Light Tobacco Smoker -- 1.00 packs/day    Types: Cigarettes  . Smokeless tobacco: Not on file  . Alcohol Use: No   OB History   Grav Para Term Preterm Abortions TAB SAB Ect Mult Living   1 1             Review of Systems  Respiratory: Positive for cough and shortness of breath.   All other systems reviewed and are negative.     Allergies  Estrogens; Morphine and related; Tamiflu; and Wellbutrin  Home Medications   Prior to Admission medications   Medication Sig Start Date End Date Taking? Authorizing Provider  albuterol (PROVENTIL HFA;VENTOLIN HFA) 108 (90 BASE) MCG/ACT inhaler Inhale 2 puffs into the lungs every 6 (six) hours as needed. wheezing 01/06/13   Lysbeth Penner, FNP  aspirin EC 81 MG tablet Take 1 tablet (81 mg total) by mouth daily. 03/22/13   Shanda Howells, MD  cholecalciferol (VITAMIN D) 1000 UNITS  tablet Take 1,000 Units by mouth daily. Pt unsure of dosage    Historical Provider, MD  ciprofloxacin (CIPRO) 500 MG tablet Take 1 tablet (500 mg total) by mouth 2 (two) times daily. 06/09/13   Lysbeth Penner, FNP  clonazePAM (KLONOPIN) 1 MG tablet Take 1 tablet (1 mg total) by mouth 2 (two) times daily. 04/19/13 04/19/14  Levonne Spiller, MD  DULoxetine (CYMBALTA) 30 MG capsule Take 1 capsule (30 mg total) by mouth daily. 04/19/13 04/19/14  Levonne Spiller, MD  fish oil-omega-3 fatty acids 1000 MG capsule Take 3 g by mouth daily.     Historical Provider, MD  ibuprofen (ADVIL,MOTRIN) 400 MG tablet Take 400  mg by mouth as needed for pain.    Historical Provider, MD  levothyroxine (SYNTHROID, LEVOTHROID) 50 MCG tablet Take 1 tablet (50 mcg total) by mouth daily. 10/06/12   Lyman Speller, MD  pantoprazole (PROTONIX) 40 MG tablet Take 40 mg by mouth daily.    Historical Provider, MD   BP 144/78  Pulse 83  Temp(Src) 98.1 F (36.7 C) (Oral)  Resp 20  SpO2 98%  Physical Exam  Nursing note and vitals reviewed. Constitutional: She is oriented to person, place, and time. She appears well-developed and well-nourished. No distress.  HENT:  Head: Normocephalic and atraumatic.  Mouth/Throat: Oropharynx is clear and moist.  Eyes: Conjunctivae and EOM are normal. Pupils are equal, round, and reactive to light.  Neck: Normal range of motion. Neck supple.  Cardiovascular: Normal rate, regular rhythm and normal heart sounds.   Pulmonary/Chest: Accessory muscle usage present. No respiratory distress. She has wheezes. She has no rhonchi. She has no rales.  Increased work of breathing with accessory muscle use; diffuse expiratory wheezes throughout  Musculoskeletal: Normal range of motion.  Neurological: She is alert and oriented to person, place, and time.  Skin: Skin is warm and dry. She is not diaphoretic.  Psychiatric: She has a normal mood and affect.    ED Course  Procedures (including critical care time) Labs Review Labs Reviewed  BASIC METABOLIC PANEL - Abnormal; Notable for the following:    Potassium 3.2 (*)    Glucose, Bld 116 (*)    BUN 5 (*)    All other components within normal limits  CULTURE, BLOOD (ROUTINE X 2)  CULTURE, BLOOD (ROUTINE X 2)  CBC WITH DIFFERENTIAL  PRO B NATRIURETIC PEPTIDE  TROPONIN I    Imaging Review Dg Chest 2 View  10/26/2013   CLINICAL DATA:  Chest pain for 6 days. Hypothyroidism. Asthma. Shortness of breath.  EXAM: CHEST  2 VIEW  COMPARISON:  01/06/2013.  FINDINGS: Lung volumes are lower than on prior. There is interstitial and mild alveolar opacity,  most compatible with pulmonary edema. Airspace disease is slightly asymmetric, greater on the left than right. Left basilar atelectasis is present. Multifocal pneumonia is considered less likely. There is no pleural effusion. Surgical clips project over the right infrahilar region.  IMPRESSION: Diffuse interstitial and mild alveolar opacity favored to represent pulmonary edema over multifocal pneumonia. Low volume chest.   Electronically Signed   By: Dereck Ligas M.D.   On: 10/26/2013 13:45     EKG Interpretation None      MDM   Final diagnoses:  Atypical pneumonia  Asthma exacerbation  Cough   43 year old female with history of asthma, presenting to the for worsening shortness of breath over the past 4 days. She goes associated cough and fever. On arrival, patient's breathing is labored with audible  wheezing.  She was started on hour long continuous albuterol nebulizer treatment. Chest x-ray pending.  Chest x-ray with diffuse interstitial and alveolar opacity representing pulmonary edema versus atypical multifocal pneumonia. Labs were obtained which are reassuring. Patient's BNP is within normal limits. Second hour neb was started, however pt unable to finish due to nausea from albuterol.  Dose of zofran given.  Pts breathing has slowed from arrival but do not feel she is stable enough to be discharged home.  Patient is anxious about admission but agreeable to stay, she requests xanax which was given.  Blood cultures obtained, pt will be treated with rocephin and azithromycin for atypical pneumonia.  Case discussed with Dr. Rockne Menghini who will admit to med-surg.  Temp admission orders placed. VS stable at this time.  Larene Pickett, PA-C 10/26/13 1657

## 2013-10-26 NOTE — Progress Notes (Signed)
Pt brought home CPAP in, Machine inspected for frayed cords and wires, machine in proper working order.  Pt will self administer CPAP when ready.  RT to monitor and assess as needed.

## 2013-10-27 LAB — LEGIONELLA ANTIGEN, URINE: Legionella Antigen, Urine: NEGATIVE

## 2013-10-27 LAB — HIV ANTIBODY (ROUTINE TESTING W REFLEX): HIV 1&2 Ab, 4th Generation: NONREACTIVE

## 2013-10-27 MED ORDER — NICOTINE POLACRILEX 2 MG MT GUM
2.0000 mg | CHEWING_GUM | OROMUCOSAL | Status: DC | PRN
  Administered 2013-10-27: 2 mg via ORAL
  Filled 2013-10-27: qty 1

## 2013-10-27 MED ORDER — AZITHROMYCIN 250 MG PO TABS: ORAL_TABLET | ORAL | Status: DC

## 2013-10-27 MED ORDER — PREDNISONE 50 MG PO TABS: ORAL_TABLET | ORAL | Status: DC

## 2013-10-27 MED ORDER — AMOXICILLIN 875 MG PO TABS: 875.0000 mg | ORAL_TABLET | Freq: Two times a day (BID) | ORAL | Status: DC

## 2013-10-27 MED ORDER — BENZONATATE 100 MG PO CAPS: 100.0000 mg | ORAL_CAPSULE | Freq: Three times a day (TID) | ORAL | Status: DC | PRN

## 2013-10-27 NOTE — ED Provider Notes (Signed)
Medical screening examination/treatment/procedure(s) were conducted as a shared visit with non-physician practitioner(s) and myself.  I personally evaluated the patient during the encounter.   EKG Interpretation None      She was previous history of asthma presents to the ER for progressively worsening difficulty breathing. Patient had significant bronchospasm on arrival that did not improve with continuous nebulizer treatment. X-ray shows interstitial fullness which could be pulmonary edema, but BNP was normal. I suspect this is an atypical pneumonia. Patient will require hospitalization for further workup and treatment.  Orpah Greek, MD 10/27/13 1549

## 2013-10-27 NOTE — Progress Notes (Signed)
Spoke with patient during leadership rounds.  Patient expressed anxiety, wants to leave unit to smoke.  Reviewed patient's chart, advised her to try nicotine supplement therapies.  Patient stated that if she could not leave the floor she wanted to sign out AMA.  Called Dr. Wendee Beavers to make him aware, advise to proceed with AMA if patient wishes to do so.   Collaborated with pts RN to present patient with her options. Tawni Levy, Air traffic controller

## 2013-10-27 NOTE — Discharge Summary (Signed)
Physician Discharge Summary  Denise Ray [id number] DOB: 07/18/1970 DOA: 10/26/2013  PCP: Redge Gainer, MD  Admit date: 10/26/2013 Discharge date: 10/27/2013  Time spent: > 35 minutes  Recommendations for Outpatient Follow-up:  1. Please continue to encourage tobacco cessation 2. Reassess K levels on follow up  Discharge Diagnoses: Please see list under hospital course below.  Discharge Condition: stable  Diet recommendation: regular diet.  Filed Weights   10/26/13 1500  Weight: 107.593 kg (237 lb 3.2 oz)    History of present illness:  Pt is a 43 y/o with history of Asthma, nicotine dependence, who presented to the hospital after developing difficulty breathing.  Hospital Course:  Atypical PNA - d/c on azithromycin and amoxicillin  - WBC within normal limits  Asthma exacerbation - Pt has albuterol at home - breathing comfortably on room air. She is requesting discharge given her improvement - Prednisone daily for the next 5 days - Will discharge with tessalon prn cough   Nicotine dependence - Pt threatened to leave AMA because she was not allowed to go outside and smoke. - I have strongly recommended cessation as this could make her breathing condition worse. Pt acknowledge understanding  Procedures:  None  Consultations:  None  Discharge Exam: Filed Vitals:   10/27/13 0900  BP: 119/60  Pulse: 88  Temp: 98.2 F (36.8 C)  Resp:     General: Pt in NAD, alert and awake Cardiovascular: RRR, no MRG Respiratory: CTA BL, no wheezes  Discharge Instructions You were cared for by a hospitalist during your hospital stay. If you have any questions about your discharge medications or the care you received while you were in the hospital after you are discharged, you can call the unit and asked to speak with the hospitalist on call if the hospitalist that took care of you is not available. Once you are discharged, your primary care physician will handle any  further medical issues. Please note that NO REFILLS for any discharge medications will be authorized once you are discharged, as it is imperative that you return to your primary care physician (or establish a relationship with a primary care physician if you do not have one) for your aftercare needs so that they can reassess your need for medications and monitor your lab values.  Discharge Instructions   Call MD for:  extreme fatigue    Complete by:  As directed      Call MD for:  severe uncontrolled pain    Complete by:  As directed      Call MD for:  temperature >100.4    Complete by:  As directed      Diet - low sodium heart healthy    Complete by:  As directed      Discharge instructions    Complete by:  As directed   Please refrain from smoking tobacco     Increase activity slowly    Complete by:  As directed             Medication List    STOP taking these medications       ALPRAZolam 0.5 MG tablet  Commonly known as:  XANAX      TAKE these medications       albuterol (5 MG/ML) 0.5% nebulizer solution  Commonly known as:  PROVENTIL  Take 2.5 mg by nebulization every 6 (six) hours as needed for wheezing or shortness of breath.     albuterol 108 (90 BASE) MCG/ACT inhaler  Commonly known as:  PROVENTIL HFA;VENTOLIN HFA  Inhale 2 puffs into the lungs every 6 (six) hours as needed. wheezing     amoxicillin 875 MG tablet  Commonly known as:  AMOXIL  Take 1 tablet (875 mg total) by mouth 2 (two) times daily.     azithromycin 250 MG tablet  Commonly known as:  ZITHROMAX Z-PAK  Please take as indicated in package insert. Should any questions arise please discuss with pharmacist.     benzonatate 100 MG capsule  Commonly known as:  TESSALON PERLES  Take 1 capsule (100 mg total) by mouth 3 (three) times daily as needed for cough.     clonazePAM 1 MG tablet  Commonly known as:  KLONOPIN  Take 1 tablet (1 mg total) by mouth 2 (two) times daily.     DULoxetine 30 MG  capsule  Commonly known as:  CYMBALTA  Take 1 capsule (30 mg total) by mouth daily.     ibuprofen 400 MG tablet  Commonly known as:  ADVIL,MOTRIN  Take 400 mg by mouth as needed for pain.     levothyroxine 50 MCG tablet  Commonly known as:  SYNTHROID, LEVOTHROID  Take 50 mcg by mouth daily before breakfast.     pantoprazole 40 MG tablet  Commonly known as:  PROTONIX  Take 40 mg by mouth daily.     predniSONE 50 MG tablet  Commonly known as:  DELTASONE  Take one table daily for the next five days.       Allergies  Allergen Reactions  . Estrogens Other (See Comments)    Stroke symptoms  . Morphine And Related     Sensitivity and does not alleviate pain  . Tamiflu Cough  . Wellbutrin [Bupropion]     Caused tias combined with estrogen      The results of significant diagnostics from this hospitalization (including imaging, microbiology, ancillary and laboratory) are listed below for reference.    Significant Diagnostic Studies: Dg Chest 2 View  10/26/2013   CLINICAL DATA:  Chest pain for 6 days. Hypothyroidism. Asthma. Shortness of breath.  EXAM: CHEST  2 VIEW  COMPARISON:  01/06/2013.  FINDINGS: Lung volumes are lower than on prior. There is interstitial and mild alveolar opacity, most compatible with pulmonary edema. Airspace disease is slightly asymmetric, greater on the left than right. Left basilar atelectasis is present. Multifocal pneumonia is considered less likely. There is no pleural effusion. Surgical clips project over the right infrahilar region.  IMPRESSION: Diffuse interstitial and mild alveolar opacity favored to represent pulmonary edema over multifocal pneumonia. Low volume chest.   Electronically Signed   By: Dereck Ligas M.D.   On: 10/26/2013 13:45    Microbiology: Recent Results (from the past 240 hour(s))  CULTURE, BLOOD (ROUTINE X 2)     Status: None   Collection Time    10/26/13  4:07 PM      Result Value Ref Range Status   Specimen Description  BLOOD LEFT ANTECUBITAL   Final   Special Requests BOTTLES DRAWN AEROBIC AND ANAEROBIC 5ML   Final   Culture  Setup Time     Final   Value: 10/26/2013 22:01     Performed at [facility]   Culture     Final   Value:        BLOOD CULTURE RECEIVED NO GROWTH TO DATE CULTURE WILL BE HELD FOR 5 DAYS BEFORE ISSUING A FINAL NEGATIVE REPORT     Performed at [facility]   Report Status PENDING   Incomplete  CULTURE, BLOOD (ROUTINE X 2)     Status: None   Collection Time    10/26/13  4:08 PM      Result Value Ref Range Status   Specimen Description BLOOD BLOOD LEFT FOREARM   Final   Special Requests BOTTLES DRAWN AEROBIC AND ANAEROBIC 5ML   Final   Culture  Setup Time     Final   Value: 10/26/2013 22:01     Performed at [facility]   Culture     Final   Value:        BLOOD CULTURE RECEIVED NO GROWTH TO DATE CULTURE WILL BE HELD FOR 5 DAYS BEFORE ISSUING A FINAL NEGATIVE REPORT     Performed at [facility]   Report Status PENDING   Incomplete     Labs: Basic Metabolic Panel:  Recent Labs Lab 10/26/13 1409  NA 140  K 3.2*  CL 102  CO2 20  GLUCOSE 116*  BUN 5*  CREATININE 0.64  CALCIUM 9.6   Liver Function Tests: No results found for this basename: AST, ALT, ALKPHOS, BILITOT, PROT, ALBUMIN,  in the last 168 hours No results found for this basename: LIPASE, AMYLASE,  in the last 168 hours No results found for this basename: AMMONIA,  in the last 168 hours CBC:  Recent Labs Lab 10/26/13 1409  WBC 9.4  NEUTROABS 6.2  HGB 14.3  HCT 42.2  MCV 87.0  PLT 289   Cardiac Enzymes:  Recent Labs Lab 10/26/13 1409  TROPONINI <0.30   BNP: BNP (last 3 results)  Recent Labs  10/26/13 1409  PROBNP 95.1   CBG: No results found for this basename: GLUCAP,  in the last 168 hours     Signed:  Velvet Bathe  [facility] 10/27/2013, 1:25 PM

## 2013-10-29 LAB — CULTURE, RESPIRATORY

## 2013-10-31 ENCOUNTER — Inpatient Hospital Stay (HOSPITAL_COMMUNITY)
Admission: EM | Admit: 2013-10-31 | Discharge: 2013-11-05 | DRG: 208 | Disposition: A | Discharge status: Home / Self Care | Payer: 59 | Attending: Internal Medicine | Admitting: Internal Medicine

## 2013-10-31 ENCOUNTER — Inpatient Hospital Stay (HOSPITAL_COMMUNITY): Payer: 59

## 2013-10-31 ENCOUNTER — Encounter (HOSPITAL_COMMUNITY): Payer: Self-pay | Admitting: Emergency Medicine

## 2013-10-31 ENCOUNTER — Ambulatory Visit (HOSPITAL_COMMUNITY): Payer: 59

## 2013-10-31 ENCOUNTER — Emergency Department (HOSPITAL_COMMUNITY): Payer: 59

## 2013-10-31 DIAGNOSIS — S27309D Unspecified injury of lung, unspecified, subsequent encounter: Secondary | ICD-10-CM

## 2013-10-31 DIAGNOSIS — Z8709 Personal history of other diseases of the respiratory system: Secondary | ICD-10-CM | POA: Diagnosis present

## 2013-10-31 DIAGNOSIS — J189 Pneumonia, unspecified organism: Secondary | ICD-10-CM

## 2013-10-31 DIAGNOSIS — E669 Obesity, unspecified: Secondary | ICD-10-CM | POA: Diagnosis present

## 2013-10-31 DIAGNOSIS — K219 Gastro-esophageal reflux disease without esophagitis: Secondary | ICD-10-CM | POA: Diagnosis present

## 2013-10-31 DIAGNOSIS — Z885 Allergy status to narcotic agent status: Secondary | ICD-10-CM

## 2013-10-31 DIAGNOSIS — Z7982 Long term (current) use of aspirin: Secondary | ICD-10-CM

## 2013-10-31 DIAGNOSIS — B9789 Other viral agents as the cause of diseases classified elsewhere: Secondary | ICD-10-CM | POA: Diagnosis present

## 2013-10-31 DIAGNOSIS — F329 Major depressive disorder, single episode, unspecified: Secondary | ICD-10-CM | POA: Diagnosis present

## 2013-10-31 DIAGNOSIS — R0603 Acute respiratory distress: Secondary | ICD-10-CM | POA: Diagnosis present

## 2013-10-31 DIAGNOSIS — E039 Hypothyroidism, unspecified: Secondary | ICD-10-CM | POA: Diagnosis present

## 2013-10-31 DIAGNOSIS — R110 Nausea: Secondary | ICD-10-CM | POA: Diagnosis not present

## 2013-10-31 DIAGNOSIS — G4733 Obstructive sleep apnea (adult) (pediatric): Secondary | ICD-10-CM | POA: Diagnosis present

## 2013-10-31 DIAGNOSIS — E876 Hypokalemia: Secondary | ICD-10-CM | POA: Diagnosis present

## 2013-10-31 DIAGNOSIS — F17200 Nicotine dependence, unspecified, uncomplicated: Secondary | ICD-10-CM | POA: Diagnosis present

## 2013-10-31 DIAGNOSIS — Z79899 Other long term (current) drug therapy: Secondary | ICD-10-CM

## 2013-10-31 DIAGNOSIS — R0989 Other specified symptoms and signs involving the circulatory and respiratory systems: Secondary | ICD-10-CM

## 2013-10-31 DIAGNOSIS — Z888 Allergy status to other drugs, medicaments and biological substances status: Secondary | ICD-10-CM

## 2013-10-31 DIAGNOSIS — J9602 Acute respiratory failure with hypercapnia: Secondary | ICD-10-CM

## 2013-10-31 DIAGNOSIS — Z6841 Body Mass Index (BMI) 40.0 and over, adult: Secondary | ICD-10-CM

## 2013-10-31 DIAGNOSIS — J9600 Acute respiratory failure, unspecified whether with hypoxia or hypercapnia: Secondary | ICD-10-CM | POA: Diagnosis present

## 2013-10-31 DIAGNOSIS — R0609 Other forms of dyspnea: Secondary | ICD-10-CM

## 2013-10-31 DIAGNOSIS — Z8673 Personal history of transient ischemic attack (TIA), and cerebral infarction without residual deficits: Secondary | ICD-10-CM

## 2013-10-31 DIAGNOSIS — B348 Other viral infections of unspecified site: Secondary | ICD-10-CM | POA: Diagnosis not present

## 2013-10-31 DIAGNOSIS — F4310 Post-traumatic stress disorder, unspecified: Secondary | ICD-10-CM | POA: Diagnosis present

## 2013-10-31 DIAGNOSIS — Z818 Family history of other mental and behavioral disorders: Secondary | ICD-10-CM

## 2013-10-31 DIAGNOSIS — J45901 Unspecified asthma with (acute) exacerbation: Secondary | ICD-10-CM | POA: Diagnosis present

## 2013-10-31 DIAGNOSIS — Z6379 Other stressful life events affecting family and household: Secondary | ICD-10-CM

## 2013-10-31 DIAGNOSIS — I509 Heart failure, unspecified: Secondary | ICD-10-CM

## 2013-10-31 DIAGNOSIS — J1289 Other viral pneumonia: Principal | ICD-10-CM | POA: Diagnosis present

## 2013-10-31 DIAGNOSIS — F3289 Other specified depressive episodes: Secondary | ICD-10-CM | POA: Diagnosis present

## 2013-10-31 DIAGNOSIS — Z8541 Personal history of malignant neoplasm of cervix uteri: Secondary | ICD-10-CM

## 2013-10-31 DIAGNOSIS — S27309A Unspecified injury of lung, unspecified, initial encounter: Secondary | ICD-10-CM | POA: Diagnosis present

## 2013-10-31 DIAGNOSIS — Z803 Family history of malignant neoplasm of breast: Secondary | ICD-10-CM

## 2013-10-31 DIAGNOSIS — F411 Generalized anxiety disorder: Secondary | ICD-10-CM | POA: Diagnosis present

## 2013-10-31 DIAGNOSIS — I10 Essential (primary) hypertension: Secondary | ICD-10-CM | POA: Diagnosis present

## 2013-10-31 DIAGNOSIS — B370 Candidal stomatitis: Secondary | ICD-10-CM | POA: Diagnosis not present

## 2013-10-31 HISTORY — DX: Personal history of other diseases of the respiratory system: Z87.09

## 2013-10-31 LAB — BLOOD GAS, ARTERIAL
ACID-BASE EXCESS: 3 mmol/L — AB (ref 0.0–2.0)
Acid-Base Excess: 1.8 mmol/L (ref 0.0–2.0)
Acid-Base Excess: 0.6 mmol/L (ref 0.0–2.0)
BICARBONATE: 23.8 meq/L (ref 20.0–24.0)
Bicarbonate: 27 mEq/L — ABNORMAL HIGH (ref 20.0–24.0)
Bicarbonate: 26.4 mEq/L — ABNORMAL HIGH (ref 20.0–24.0)
DRAWN BY: 276051
Delivery systems: POSITIVE
Drawn by: 308601
Drawn by: 276051
Expiratory PAP: 5
FIO2: 0.4 %
FIO2: 1 %
FIO2: 1 %
Inspiratory PAP: 10
LHR: 20 {breaths}/min
MODE: POSITIVE
O2 Saturation: 99.5 %
O2 Saturation: 98.8 %
O2 Saturation: 99.1 %
PCO2 ART: 26.4 mmHg — AB (ref 35.0–45.0)
PCO2 ART: 46.6 mmHg — AB (ref 35.0–45.0)
PCO2 ART: 50.2 mmHg — AB (ref 35.0–45.0)
PEEP/CPAP: 7 cmH2O
PEEP/CPAP: 10 cmH2O
PH ART: 7.562 — AB (ref 7.350–7.450)
PH ART: 7.38 (ref 7.350–7.450)
PH ART: 7.341 — AB (ref 7.350–7.450)
PO2 ART: 217 mmHg — AB (ref 80.0–100.0)
PO2 ART: 198 mmHg — AB (ref 80.0–100.0)
Patient temperature: 98.6
Patient temperature: 98.6
Patient temperature: 98.6
RATE: 20 resp/min
TCO2: 20.1 mmol/L (ref 0–100)
TCO2: 23.9 mmol/L (ref 0–100)
TCO2: 23.9 mmol/L (ref 0–100)
VT: 450 mL
VT: 400 mL
pO2, Arterial: 141 mmHg — ABNORMAL HIGH (ref 80.0–100.0)

## 2013-10-31 LAB — GLUCOSE, CAPILLARY
GLUCOSE-CAPILLARY: 157 mg/dL — AB (ref 70–99)
GLUCOSE-CAPILLARY: 124 mg/dL — AB (ref 70–99)
GLUCOSE-CAPILLARY: 105 mg/dL — AB (ref 70–99)
Glucose-Capillary: 158 mg/dL — ABNORMAL HIGH (ref 70–99)
Glucose-Capillary: 123 mg/dL — ABNORMAL HIGH (ref 70–99)

## 2013-10-31 LAB — I-STAT CHEM 8, ED
BUN: 13 mg/dL (ref 6–23)
CALCIUM ION: 1.14 mmol/L (ref 1.12–1.23)
CREATININE: 0.8 mg/dL (ref 0.50–1.10)
Chloride: 102 mEq/L (ref 96–112)
Glucose, Bld: 102 mg/dL — ABNORMAL HIGH (ref 70–99)
HCT: 46 % (ref 36.0–46.0)
Hemoglobin: 15.6 g/dL — ABNORMAL HIGH (ref 12.0–15.0)
Potassium: 3.3 mEq/L — ABNORMAL LOW (ref 3.7–5.3)
Sodium: 143 mEq/L (ref 137–147)
TCO2: 25 mmol/L (ref 0–100)

## 2013-10-31 LAB — URINALYSIS, ROUTINE W REFLEX MICROSCOPIC
Bilirubin Urine: NEGATIVE
GLUCOSE, UA: NEGATIVE mg/dL
Hgb urine dipstick: NEGATIVE
Ketones, ur: NEGATIVE mg/dL
LEUKOCYTES UA: NEGATIVE
Nitrite: NEGATIVE
PROTEIN: NEGATIVE mg/dL
Specific Gravity, Urine: 1.01 (ref 1.005–1.030)
Urobilinogen, UA: 0.2 mg/dL (ref 0.0–1.0)
pH: 6 (ref 5.0–8.0)

## 2013-10-31 LAB — CBC WITH DIFFERENTIAL/PLATELET
BASOS ABS: 0 10*3/uL (ref 0.0–0.1)
Basophils Relative: 0 % (ref 0–1)
EOS PCT: 1 % (ref 0–5)
Eosinophils Absolute: 0.1 10*3/uL (ref 0.0–0.7)
HEMATOCRIT: 43.1 % (ref 36.0–46.0)
HEMOGLOBIN: 14.7 g/dL (ref 12.0–15.0)
LYMPHS ABS: 5.8 10*3/uL — AB (ref 0.7–4.0)
Lymphocytes Relative: 35 % (ref 12–46)
MCH: 30.2 pg (ref 26.0–34.0)
MCHC: 34.1 g/dL (ref 30.0–36.0)
MCV: 88.5 fL (ref 78.0–100.0)
MONO ABS: 1.2 10*3/uL — AB (ref 0.1–1.0)
Monocytes Relative: 7 % (ref 3–12)
Neutro Abs: 9.7 10*3/uL — ABNORMAL HIGH (ref 1.7–7.7)
Neutrophils Relative %: 57 % (ref 43–77)
Platelets: 337 10*3/uL (ref 150–400)
RBC: 4.87 MIL/uL (ref 3.87–5.11)
RDW: 13.4 % (ref 11.5–15.5)
WBC: 16.8 10*3/uL — ABNORMAL HIGH (ref 4.0–10.5)

## 2013-10-31 LAB — LEGIONELLA ANTIGEN, URINE: Legionella Antigen, Urine: NEGATIVE

## 2013-10-31 LAB — CBC
HCT: 40.1 % (ref 36.0–46.0)
Hemoglobin: 13.4 g/dL (ref 12.0–15.0)
MCH: 29.6 pg (ref 26.0–34.0)
MCHC: 33.4 g/dL (ref 30.0–36.0)
MCV: 88.7 fL (ref 78.0–100.0)
PLATELETS: 307 10*3/uL (ref 150–400)
RBC: 4.52 MIL/uL (ref 3.87–5.11)
RDW: 13.5 % (ref 11.5–15.5)
WBC: 13.2 10*3/uL — ABNORMAL HIGH (ref 4.0–10.5)

## 2013-10-31 LAB — I-STAT TROPONIN, ED: TROPONIN I, POC: 0 ng/mL (ref 0.00–0.08)

## 2013-10-31 LAB — PRO B NATRIURETIC PEPTIDE: Pro B Natriuretic peptide (BNP): 80.6 pg/mL (ref 0–125)

## 2013-10-31 LAB — CREATININE, SERUM
Creatinine, Ser: 0.82 mg/dL (ref 0.50–1.10)
GFR, EST NON AFRICAN AMERICAN: 88 mL/min — AB (ref >90)

## 2013-10-31 LAB — MRSA PCR SCREENING: MRSA by PCR: NEGATIVE

## 2013-10-31 LAB — STREP PNEUMONIAE URINARY ANTIGEN: STREP PNEUMO URINARY ANTIGEN: NEGATIVE

## 2013-10-31 LAB — PROCALCITONIN

## 2013-10-31 LAB — I-STAT CG4 LACTIC ACID, ED: Lactic Acid, Venous: 2.28 mmol/L — ABNORMAL HIGH (ref 0.5–2.2)

## 2013-10-31 IMAGING — CR DG ABD PORTABLE 1V
1 series · 1 of 1 positions shown · non-contrast
Comparison: No priors.

CLINICAL DATA: Evaluate orogastric tube placement.

EXAM:
PORTABLE ABDOMEN - 1 VIEW

[ap (kub)]
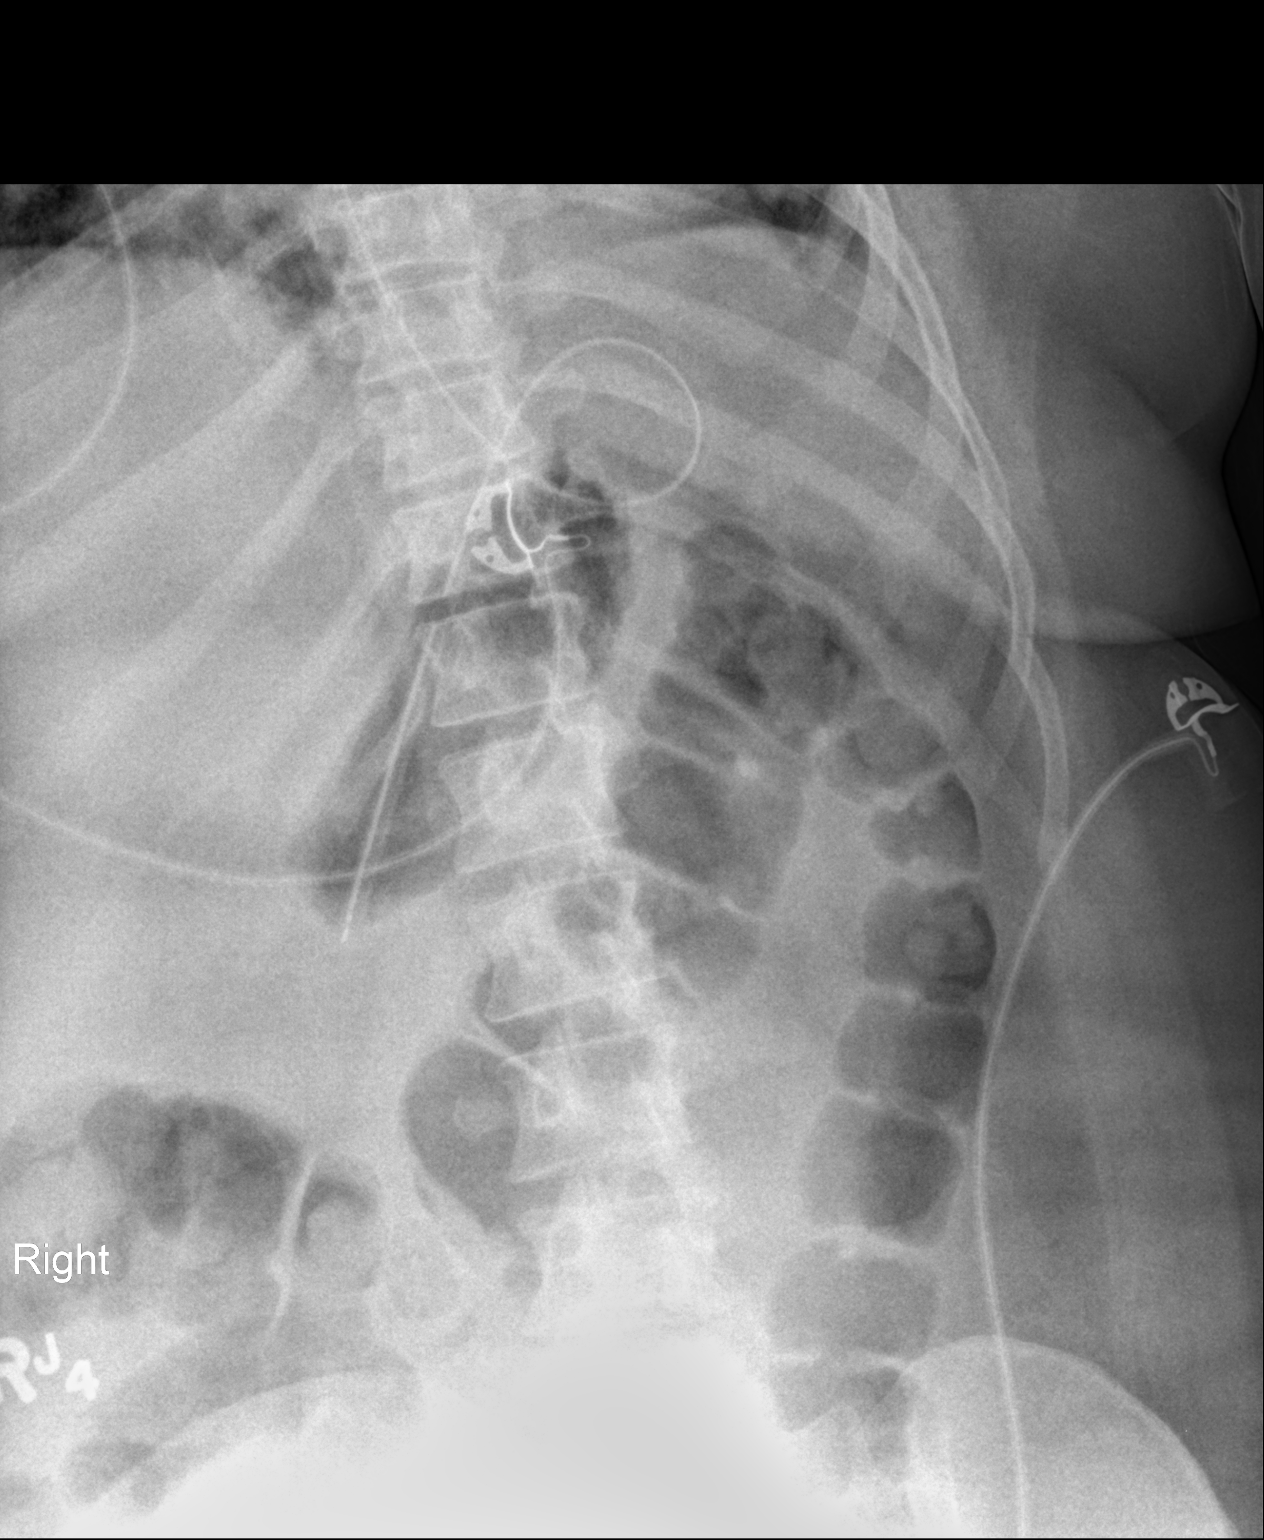

[1 of 1 positions shown; findings below may reference images not displayed]

FINDINGS: Orogastric tube is present in the stomach with tip in the antral
pre-pyloric region of the stomach. Visualized bowel gas pattern is
nonobstructive.
IMPRESSION: Tip of orogastric tube is in the antral pre-pyloric region of the
stomach.

## 2013-10-31 IMAGING — CT CT ANGIO CHEST
2 of 6 series · 19 of 46 positions shown · IV contrast (OMNIPAQUE)
Comparison: No priors.

CLINICAL DATA: ARDS. Evaluate for a pulmonary embolism. History of
cervical cancer.

EXAM:
CT ANGIOGRAPHY CHEST WITH CONTRAST
TECHNIQUE: Multidetector CT imaging of the chest was performed using the
standard protocol during bolus administration of intravenous
contrast. Multiplanar CT image reconstructions and MIPs were
obtained to evaluate the vascular anatomy.
CONTRAST:  100mL OMNIPAQUE IOHEXOL 350 MG/ML SOLN

[Series 5: pe thins @ 1mm · axial · 0.73mm/px · z∈[-172,+20]mm · 16 of 212 slices shown]
[im 10/212  lung]
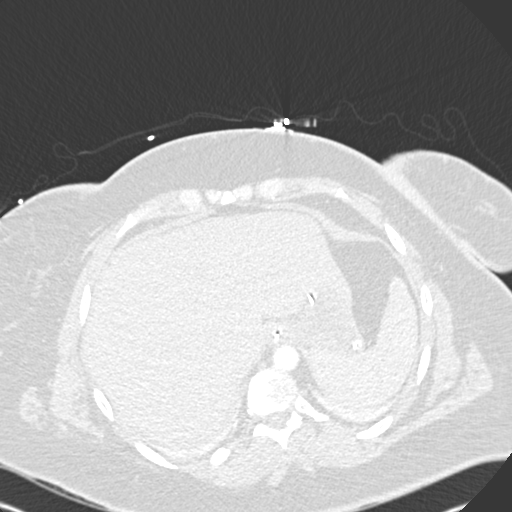
[im 28/212  soft-tissue]
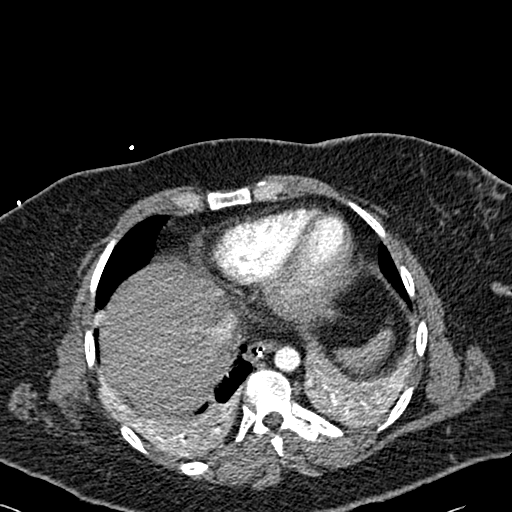
[im 37/212  lung]
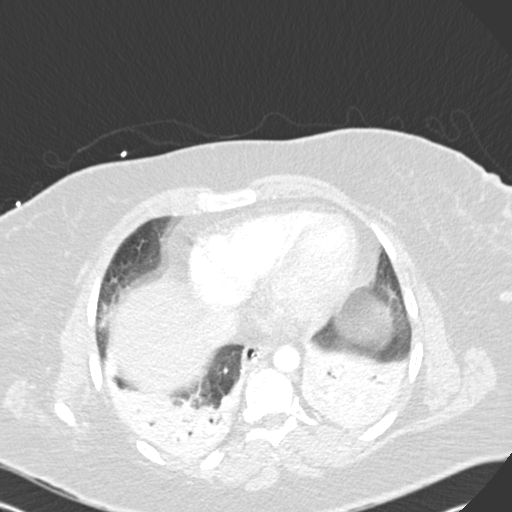
[im 46/212  soft-tissue]
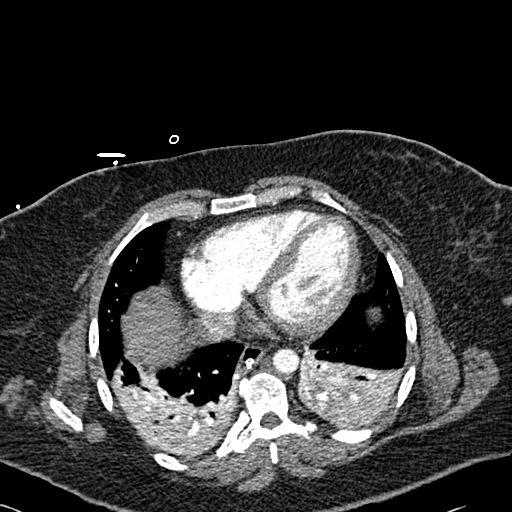
[im 65/212  lung]
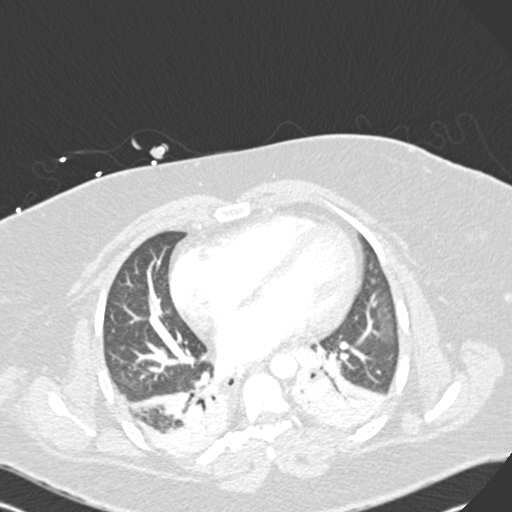
[im 74/212  soft-tissue]
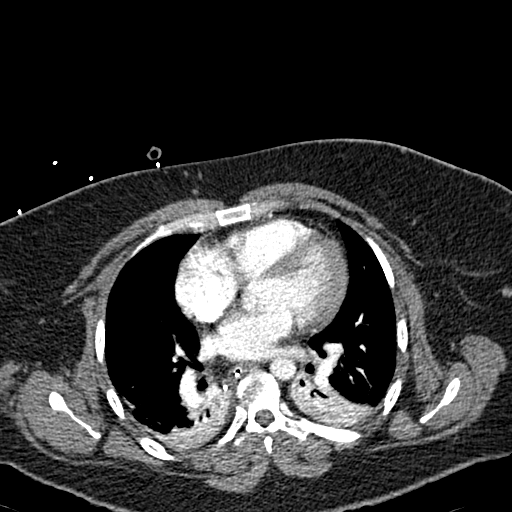
[im 83/212  lung]
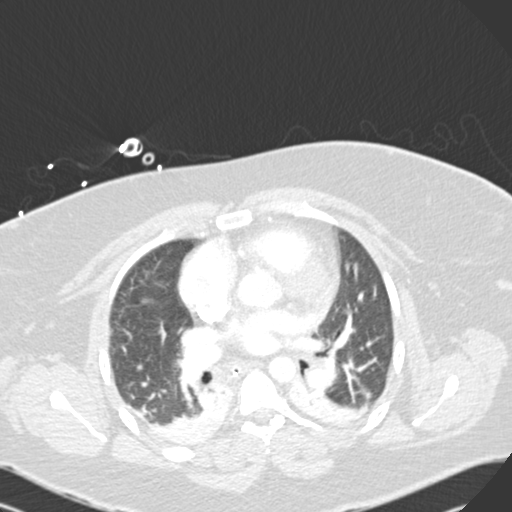
[im 101/212  soft-tissue]
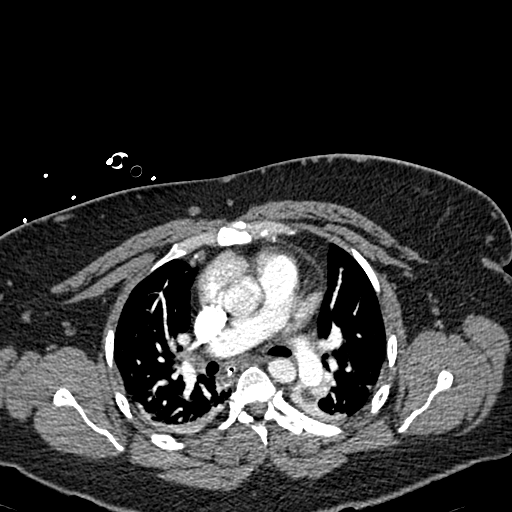
[im 111/212  lung]
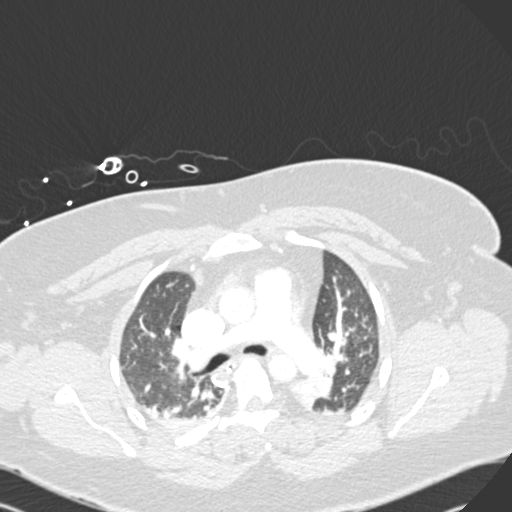
[im 129/212  soft-tissue]
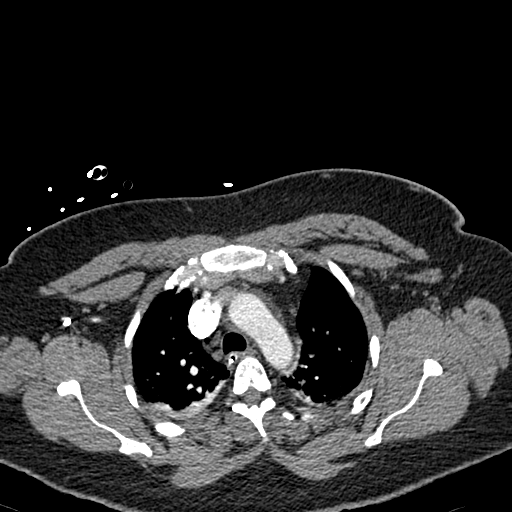
[im 138/212  lung]
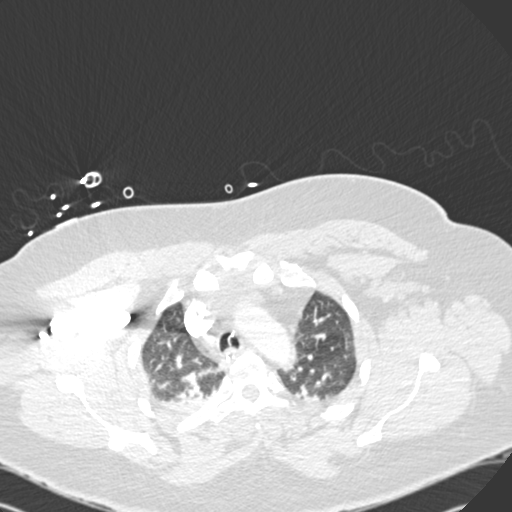
[im 147/212  soft-tissue]
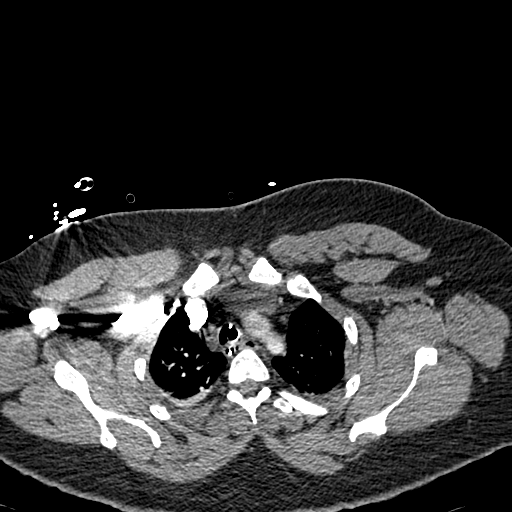
[im 166/212  lung]
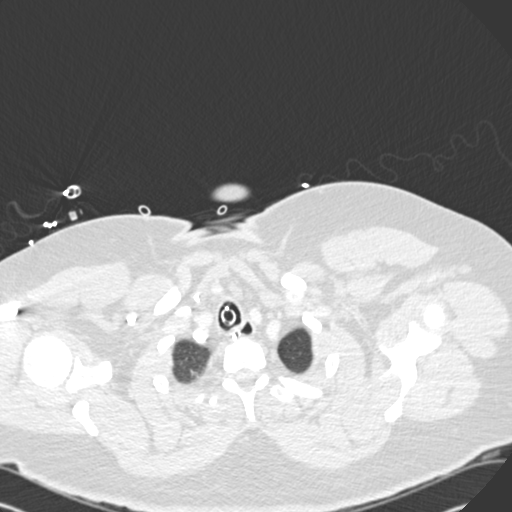
[im 175/212  soft-tissue]
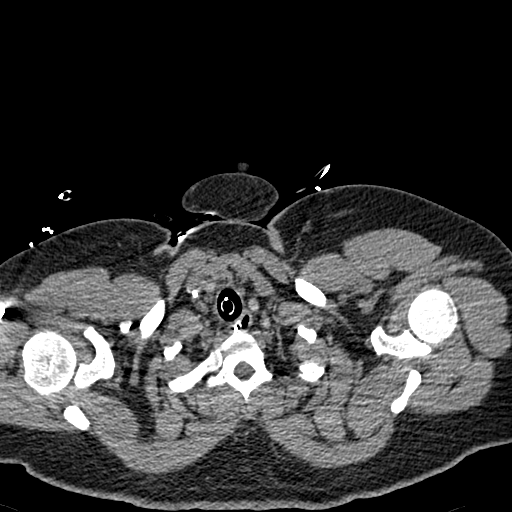
[im 184/212  lung]
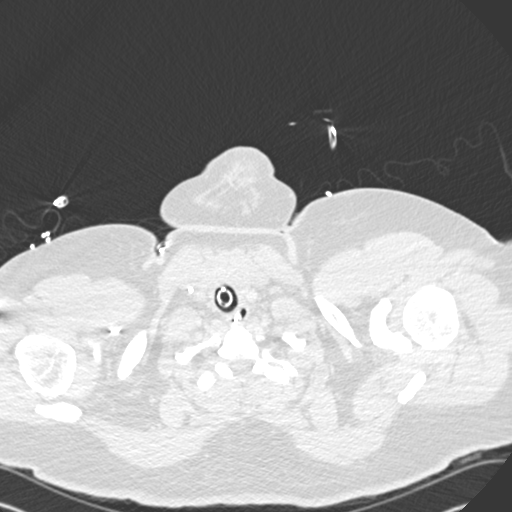
[im 202/212  soft-tissue]
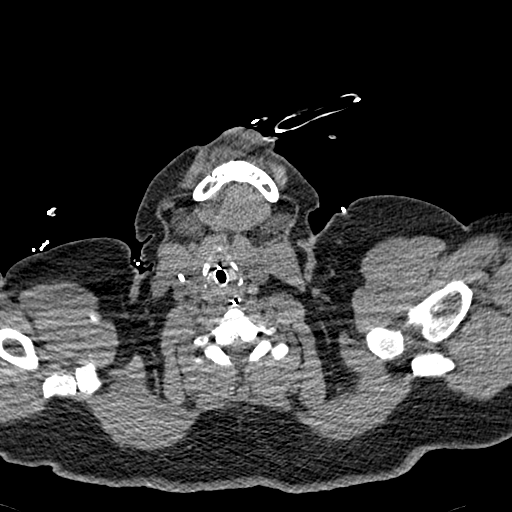

[Series 605: coronal mpr · coronal · 0.73mm/px · 3 of 128 slices shown]
[im 32/128  soft-tissue]
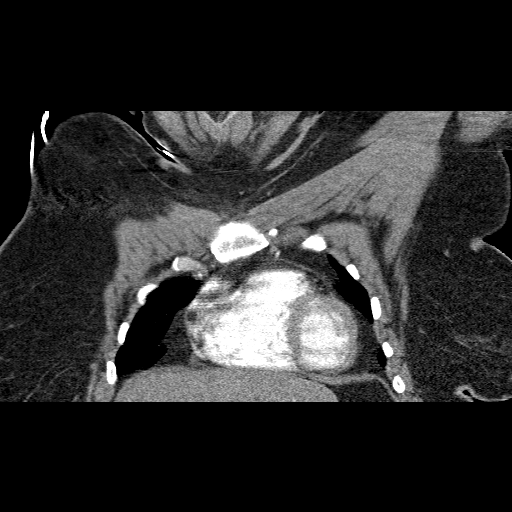
[im 64/128  soft-tissue]
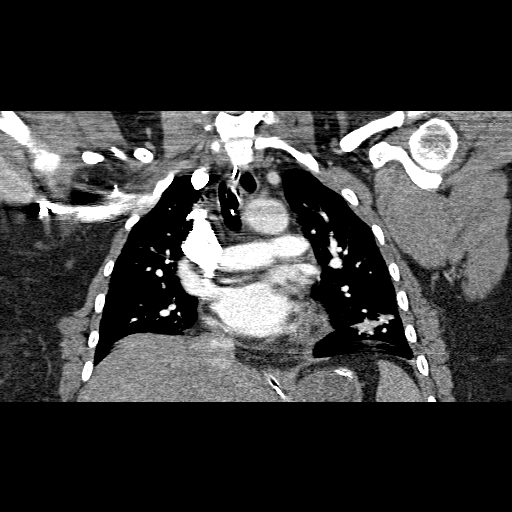
[im 96/128  soft-tissue]
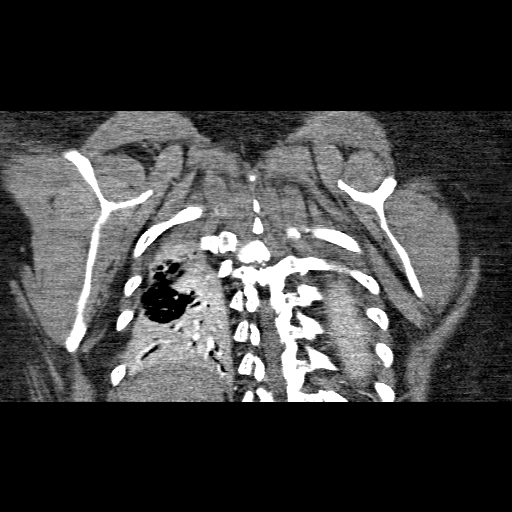

[19 of 46 positions shown; findings below may reference images not displayed]

FINDINGS: Mediastinum: No filling defects within the pulmonary arterial tree
to suggest underlying pulmonary embolism. Heart size is mildly
enlarged. There is no significant pericardial fluid, thickening or
pericardial calcification. No pathologically enlarged mediastinal or
hilar lymph nodes. Esophagus is unremarkable in appearance.
Nasogastric tube extends into the stomach (tipped of tube is below
the lower margin of the images). Endotracheal tube in position with
tip at the level of the aortic arch. Right IJ central venous
catheter with tip terminating at the superior cavoatrial junction.

Lungs/Pleura: There is a combination of volume loss and airspace
consolidation in the dependent portions of the lower lobes of the
lungs bilaterally, the appearance of which is suggestive of recent
massive aspiration. Lungs are otherwise relatively clear, with
exception of some atelectasis and consolidation in the inferior
segment of the lingula. No pleural effusions.

Upper Abdomen: Unremarkable.

Musculoskeletal: There are no aggressive appearing lytic or blastic
lesions noted in the visualized portions of the skeleton.

Review of the MIP images confirms the above findings.
IMPRESSION: 1. No evidence of pulmonary embolism.
2. The appearance of the lungs is strongly suggestive of a recent
massive aspiration event, as discussed above.

## 2013-10-31 IMAGING — CR DG CHEST 1V PORT
1 series · 1 of 1 positions shown · non-contrast
Comparison: Chest radiograph performed [DATE]

CLINICAL DATA: Respiratory distress. Shortness of breath and chest
pain. History of smoking.

EXAM:
PORTABLE CHEST - 1 VIEW

[AP]
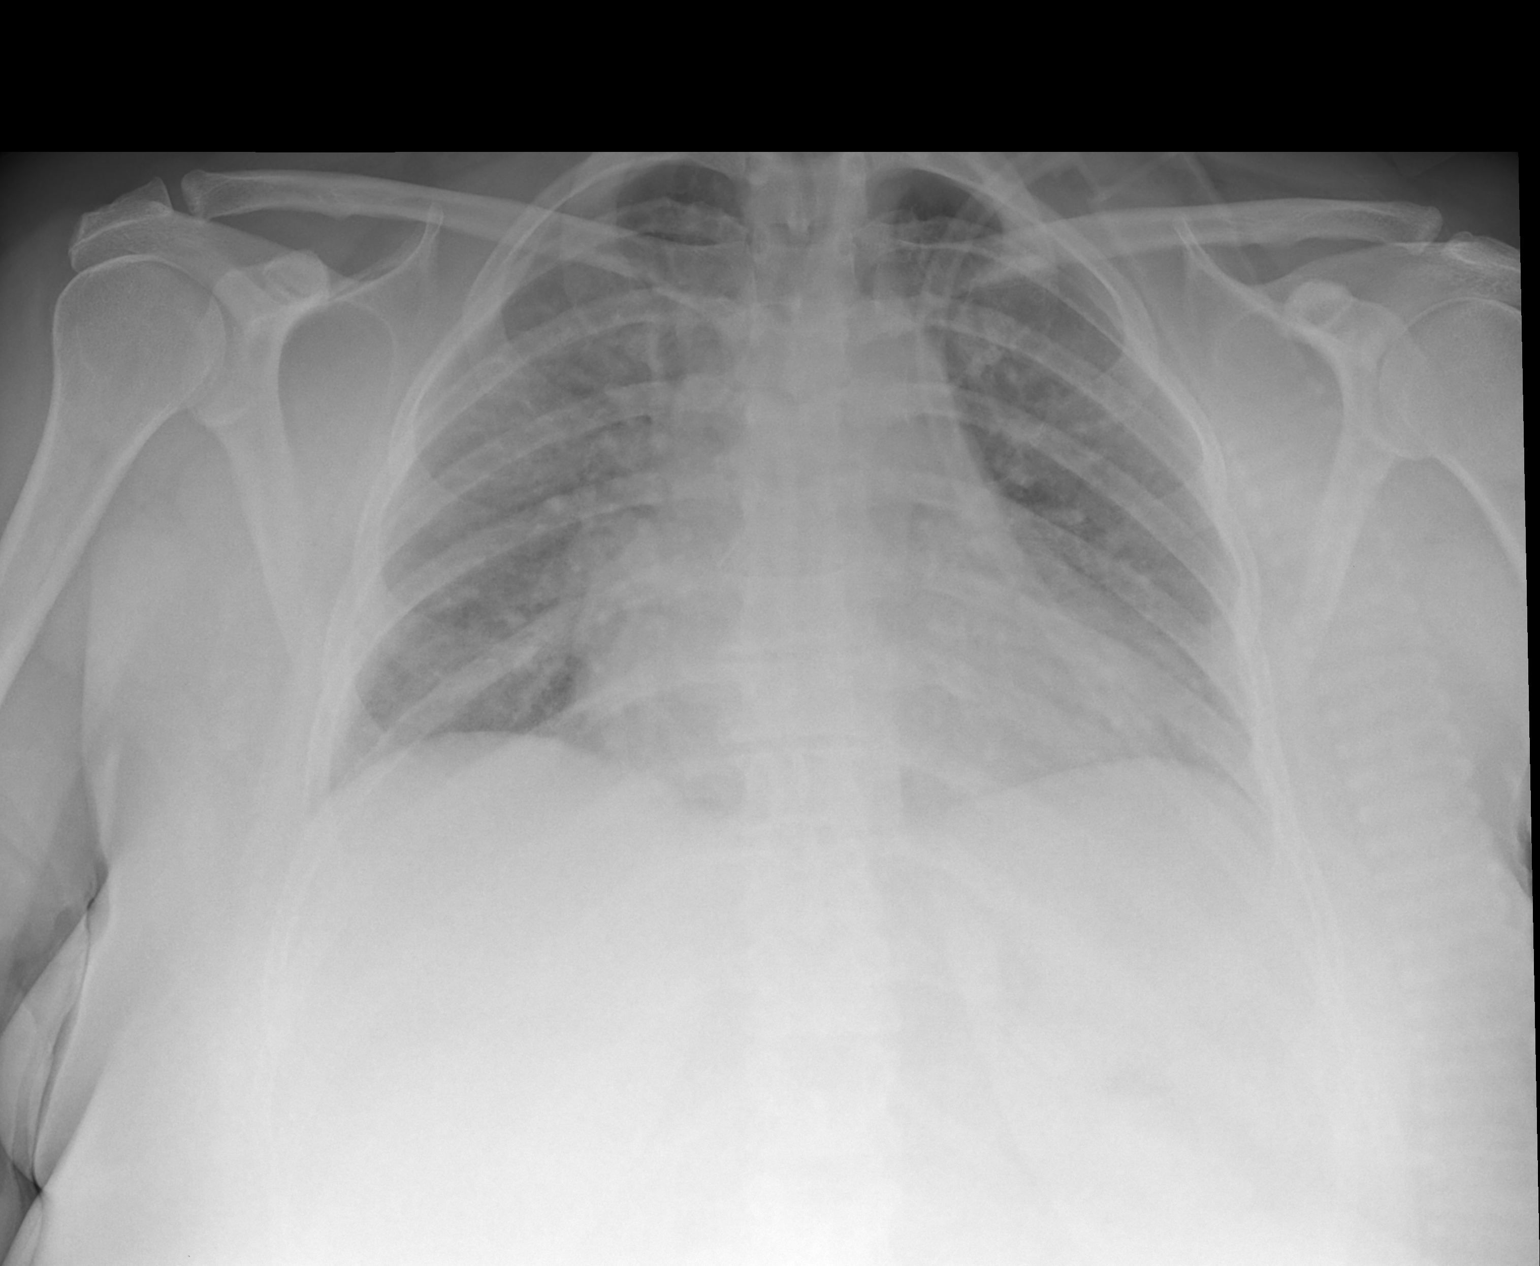

[1 of 1 positions shown; findings below may reference images not displayed]

FINDINGS: The lungs are mildly hypoexpanded. Vascular crowding and vascular
congestion are seen. Mildly increased interstitial markings may
reflect mild pulmonary edema. No pleural effusion or pneumothorax is
seen.

The cardiomediastinal silhouette is mildly enlarged. No acute
osseous abnormalities are identified.
IMPRESSION: Lungs mildly hypoexpanded. Vascular congestion and mild cardiomegaly
noted. Mildly increased interstitial markings may reflect mild
pulmonary edema.

## 2013-10-31 IMAGING — CR DG CHEST 1V PORT
1 series · 1 of 1 positions shown · non-contrast
Comparison: Study obtained earlier in the day

CLINICAL DATA: Hypoxia

EXAM:
PORTABLE CHEST - 1 VIEW

[AP]
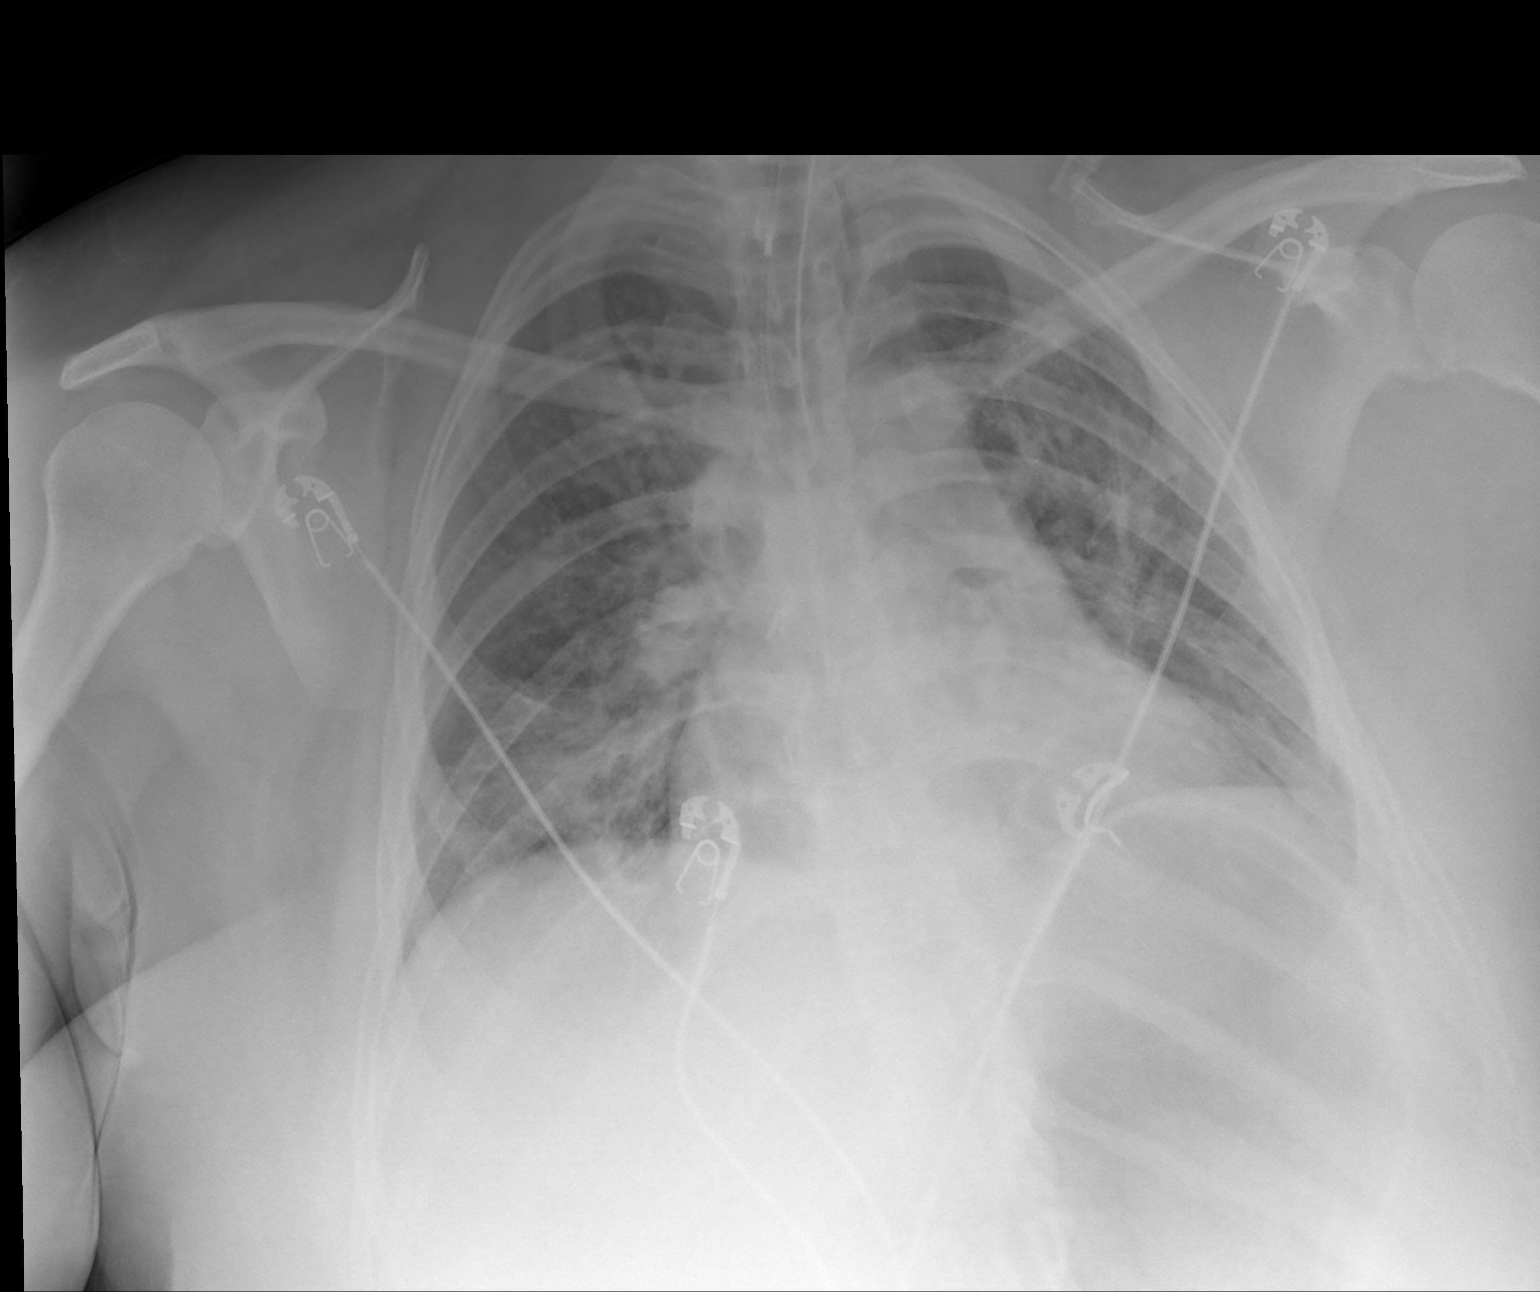

[1 of 1 positions shown; findings below may reference images not displayed]

FINDINGS: The endotracheal tube tip is at the level of the carina. No
pneumothorax. There is patchy atelectatic change in the left mid
lung and right base. Lungs are otherwise clear. Heart is mildly
enlarged with normal pulmonary vascularity.
IMPRESSION: Endotracheal tube tip is at the carina. Advise withdrawing
endotracheal tube approximately 3 cm. There is patchy atelectasis in
the right base in left mid lung. No pneumothorax.

## 2013-10-31 IMAGING — CR DG CHEST 1V PORT
1 series · 1 of 1 positions shown · non-contrast
Comparison: Study obtained earlier in the day

CLINICAL DATA: Central catheter placement

EXAM:
PORTABLE CHEST - 1 VIEW

[AP]
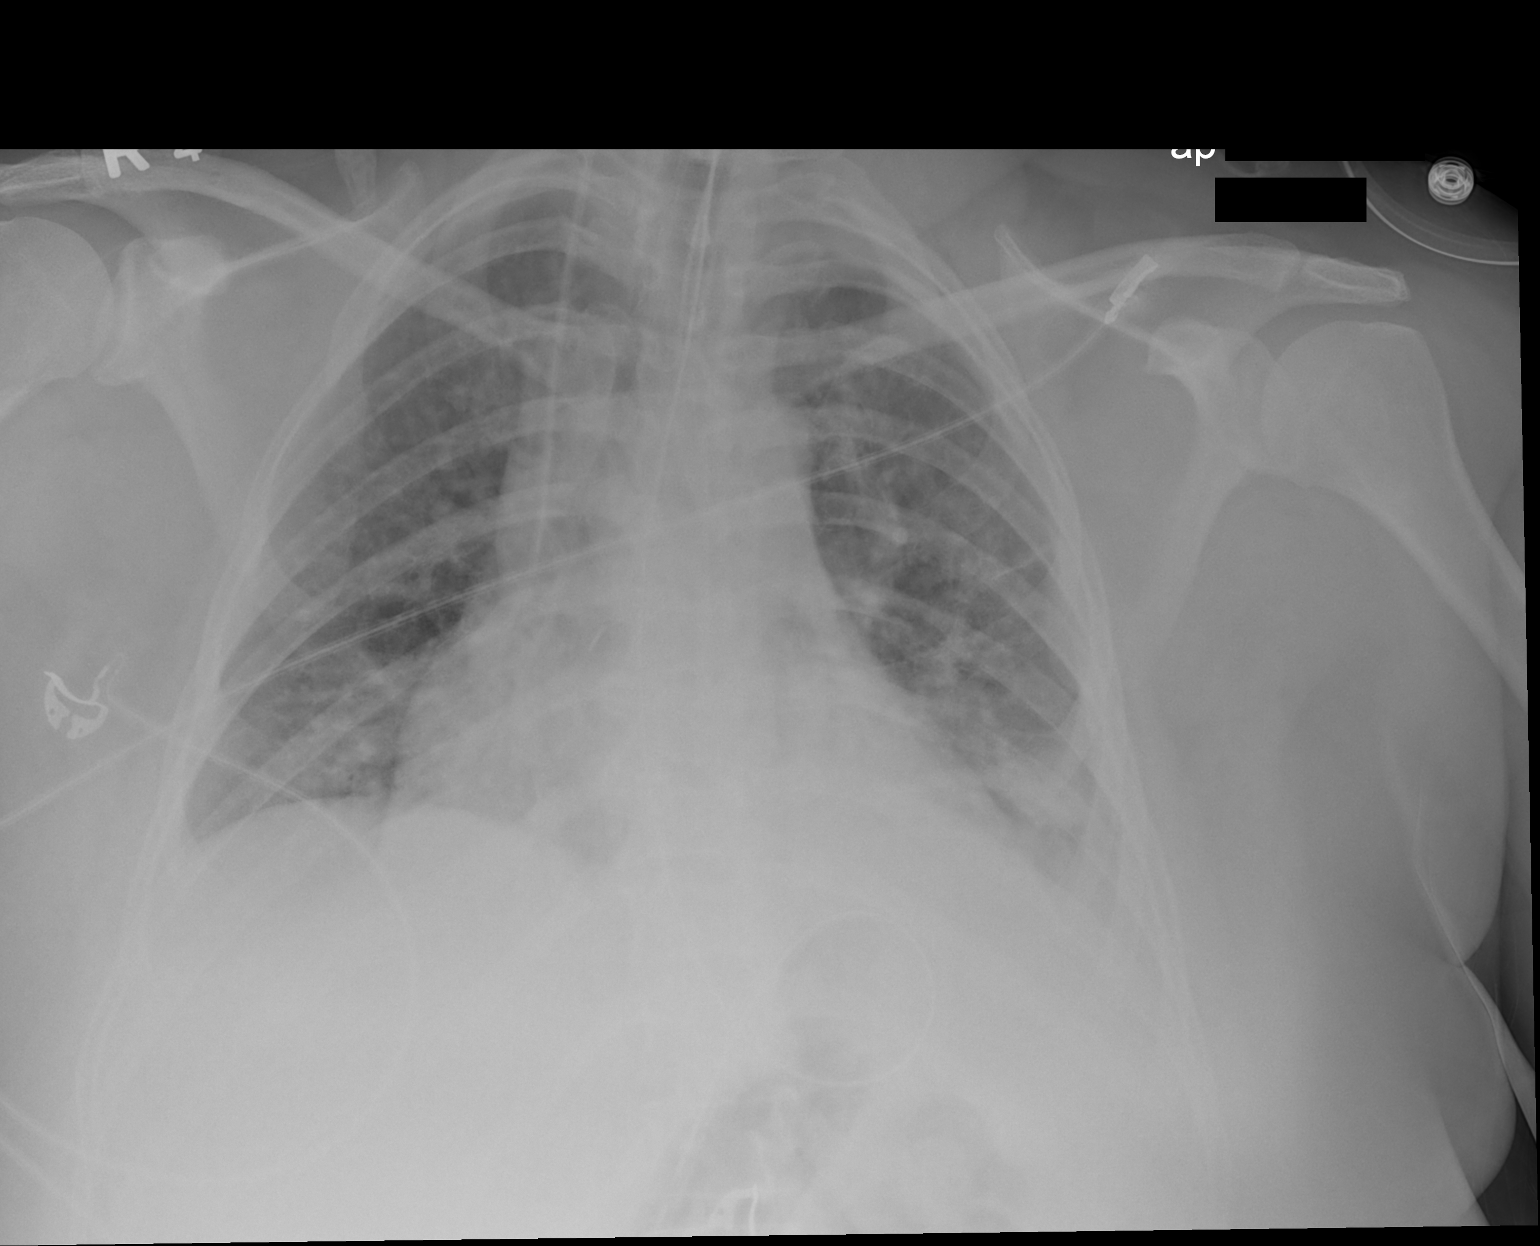

[1 of 1 positions shown; findings below may reference images not displayed]

FINDINGS: Central catheter tip is in the superior vena cava near the
cavoatrial junction. Endotracheal tube tip is 3.6 cm above the
carina. Nasogastric tube tip and side port are in the stomach. There
is a small area of consolidation in the left base, new. Lungs
elsewhere are clear. Heart is mildly enlarged with normal pulmonary
vascularity.
IMPRESSION: The tube and catheter positions as described without pneumothorax.
New area of left base infiltrate.

## 2013-10-31 MED ORDER — MIDAZOLAM HCL 2 MG/2ML IJ SOLN
2.0000 mg | Freq: Once | INTRAMUSCULAR | Status: AC
  Administered 2013-10-31: 2 mg via INTRAVENOUS
  Filled 2013-10-31: qty 2

## 2013-10-31 MED ORDER — VANCOMYCIN HCL IN DEXTROSE 1-5 GM/200ML-% IV SOLN
1000.0000 mg | Freq: Once | INTRAVENOUS | Status: AC
  Administered 2013-10-31: 1000 mg via INTRAVENOUS
  Filled 2013-10-31: qty 200

## 2013-10-31 MED ORDER — FENTANYL CITRATE 0.05 MG/ML IJ SOLN
100.0000 ug | Freq: Once | INTRAMUSCULAR | Status: AC
  Administered 2013-10-31: 100 ug via INTRAVENOUS

## 2013-10-31 MED ORDER — BUDESONIDE 0.5 MG/2ML IN SUSP
0.5000 mg | Freq: Two times a day (BID) | RESPIRATORY_TRACT | Status: DC
  Administered 2013-10-31 – 2013-11-03 (×6): 0.5 mg via RESPIRATORY_TRACT
  Filled 2013-10-31 (×7): qty 2

## 2013-10-31 MED ORDER — SODIUM CHLORIDE 0.9 % IV SOLN
1.0000 mg/h | INTRAVENOUS | Status: DC
  Administered 2013-10-31: 7 mg/h via INTRAVENOUS
  Administered 2013-10-31: 8 mg/h via INTRAVENOUS
  Administered 2013-10-31: 10 mg/h via INTRAVENOUS
  Administered 2013-11-01: 7 mg/h via INTRAVENOUS
  Filled 2013-10-31 (×4): qty 10

## 2013-10-31 MED ORDER — VANCOMYCIN HCL IN DEXTROSE 1-5 GM/200ML-% IV SOLN
1000.0000 mg | Freq: Two times a day (BID) | INTRAVENOUS | Status: DC
  Administered 2013-10-31 – 2013-11-01 (×3): 1000 mg via INTRAVENOUS
  Filled 2013-10-31 (×3): qty 200

## 2013-10-31 MED ORDER — FENTANYL CITRATE 0.05 MG/ML IJ SOLN
50.0000 ug | Freq: Once | INTRAMUSCULAR | Status: DC
  Filled 2013-10-31: qty 2

## 2013-10-31 MED ORDER — FUROSEMIDE 10 MG/ML IJ SOLN
40.0000 mg | Freq: Once | INTRAMUSCULAR | Status: AC
  Administered 2013-10-31: 40 mg via INTRAVENOUS
  Filled 2013-10-31: qty 4

## 2013-10-31 MED ORDER — INSULIN ASPART 100 UNIT/ML ~~LOC~~ SOLN
0.0000 [IU] | SUBCUTANEOUS | Status: DC
  Administered 2013-10-31: 3 [IU] via SUBCUTANEOUS
  Administered 2013-10-31: 4 [IU] via SUBCUTANEOUS
  Administered 2013-10-31: 3 [IU] via SUBCUTANEOUS

## 2013-10-31 MED ORDER — PANTOPRAZOLE SODIUM 40 MG IV SOLR
40.0000 mg | Freq: Every day | INTRAVENOUS | Status: DC
  Administered 2013-10-31 – 2013-11-01 (×2): 40 mg via INTRAVENOUS
  Filled 2013-10-31 (×2): qty 40

## 2013-10-31 MED ORDER — MIDAZOLAM HCL 2 MG/2ML IJ SOLN
INTRAMUSCULAR | Status: AC
  Administered 2013-10-31: 2 mg
  Filled 2013-10-31: qty 2

## 2013-10-31 MED ORDER — MAGNESIUM SULFATE 40 MG/ML IJ SOLN
2.0000 g | Freq: Once | INTRAMUSCULAR | Status: AC
  Administered 2013-10-31: 2 g via INTRAVENOUS
  Filled 2013-10-31: qty 50

## 2013-10-31 MED ORDER — ETOMIDATE 2 MG/ML IV SOLN
INTRAVENOUS | Status: AC
  Administered 2013-10-31: 30 mg
  Filled 2013-10-31: qty 20

## 2013-10-31 MED ORDER — FENTANYL CITRATE 0.05 MG/ML IJ SOLN
0.0000 ug/h | INTRAMUSCULAR | Status: DC
  Administered 2013-10-31: 50 ug/h via INTRAVENOUS
  Filled 2013-10-31: qty 50

## 2013-10-31 MED ORDER — CISATRACURIUM BOLUS VIA INFUSION
0.1000 mg/kg | Freq: Once | INTRAVENOUS | Status: DC
  Filled 2013-10-31: qty 11

## 2013-10-31 MED ORDER — CHLORHEXIDINE GLUCONATE 0.12 % MT SOLN
15.0000 mL | Freq: Two times a day (BID) | OROMUCOSAL | Status: DC
  Administered 2013-10-31 – 2013-11-01 (×3): 15 mL via OROMUCOSAL
  Filled 2013-10-31 (×5): qty 15

## 2013-10-31 MED ORDER — ROCURONIUM BROMIDE 50 MG/5ML IV SOLN
INTRAVENOUS | Status: AC
  Administered 2013-10-31: 60 mg
  Filled 2013-10-31: qty 2

## 2013-10-31 MED ORDER — FENTANYL CITRATE 0.05 MG/ML IJ SOLN
50.0000 ug | Freq: Once | INTRAMUSCULAR | Status: AC
  Administered 2013-10-31: 50 ug via INTRAVENOUS
  Filled 2013-10-31: qty 2

## 2013-10-31 MED ORDER — FENTANYL BOLUS VIA INFUSION
50.0000 ug | INTRAVENOUS | Status: DC | PRN
  Filled 2013-10-31: qty 50

## 2013-10-31 MED ORDER — LIDOCAINE HCL (CARDIAC) 20 MG/ML IV SOLN
INTRAVENOUS | Status: AC
  Filled 2013-10-31: qty 5

## 2013-10-31 MED ORDER — PHENYLEPHRINE 200 MCG/ML FOR PRIAPISM / HYPOTENSION
INTRAMUSCULAR | Status: AC
  Filled 2013-10-31: qty 50

## 2013-10-31 MED ORDER — IOHEXOL 350 MG/ML SOLN
100.0000 mL | Freq: Once | INTRAVENOUS | Status: AC | PRN
  Administered 2013-10-31: 100 mL via INTRAVENOUS

## 2013-10-31 MED ORDER — DEXTROSE 5 % IV SOLN
30.0000 ug/min | INTRAVENOUS | Status: DC
  Filled 2013-10-31: qty 1

## 2013-10-31 MED ORDER — ARFORMOTEROL TARTRATE 15 MCG/2ML IN NEBU
15.0000 ug | INHALATION_SOLUTION | Freq: Two times a day (BID) | RESPIRATORY_TRACT | Status: DC
  Administered 2013-10-31 – 2013-11-03 (×6): 15 ug via RESPIRATORY_TRACT
  Filled 2013-10-31 (×11): qty 2

## 2013-10-31 MED ORDER — MIDAZOLAM BOLUS VIA INFUSION
2.0000 mg | INTRAVENOUS | Status: DC | PRN
  Filled 2013-10-31: qty 2

## 2013-10-31 MED ORDER — SODIUM CHLORIDE 0.9 % IV SOLN
3.0000 ug/kg/min | INTRAVENOUS | Status: DC
  Filled 2013-10-31: qty 20

## 2013-10-31 MED ORDER — LEVOTHYROXINE SODIUM 50 MCG PO TABS
50.0000 ug | ORAL_TABLET | Freq: Every day | ORAL | Status: DC
  Administered 2013-10-31 – 2013-11-03 (×3): 50 ug
  Filled 2013-10-31 (×6): qty 1

## 2013-10-31 MED ORDER — PIPERACILLIN-TAZOBACTAM 3.375 G IVPB 30 MIN
3.3750 g | Freq: Once | INTRAVENOUS | Status: AC
  Administered 2013-10-31: 3.375 g via INTRAVENOUS
  Filled 2013-10-31: qty 50

## 2013-10-31 MED ORDER — SODIUM CHLORIDE 0.9 % IV SOLN
INTRAVENOUS | Status: DC
  Administered 2013-11-01 – 2013-11-02 (×2): via INTRAVENOUS

## 2013-10-31 MED ORDER — FENTANYL BOLUS VIA INFUSION
50.0000 ug | INTRAVENOUS | Status: DC | PRN
  Filled 2013-10-31: qty 100

## 2013-10-31 MED ORDER — HEPARIN SODIUM (PORCINE) 5000 UNIT/ML IJ SOLN: 5000.0000 [IU] | Freq: Three times a day (TID) | INTRAMUSCULAR | Status: DC

## 2013-10-31 MED ORDER — FENTANYL CITRATE 0.05 MG/ML IJ SOLN
INTRAMUSCULAR | Status: AC
  Administered 2013-10-31: 100 ug
  Filled 2013-10-31: qty 2

## 2013-10-31 MED ORDER — ENOXAPARIN SODIUM 40 MG/0.4ML ~~LOC~~ SOLN
40.0000 mg | SUBCUTANEOUS | Status: DC
  Administered 2013-10-31 – 2013-11-03 (×4): 40 mg via SUBCUTANEOUS
  Filled 2013-10-31 (×5): qty 0.4

## 2013-10-31 MED ORDER — PIPERACILLIN-TAZOBACTAM 3.375 G IVPB
3.3750 g | Freq: Three times a day (TID) | INTRAVENOUS | Status: DC
  Administered 2013-10-31 – 2013-11-02 (×6): 3.375 g via INTRAVENOUS
  Filled 2013-10-31 (×6): qty 50

## 2013-10-31 MED ORDER — MIDAZOLAM BOLUS VIA INFUSION
1.0000 mg | INTRAVENOUS | Status: DC | PRN
  Filled 2013-10-31: qty 2

## 2013-10-31 MED ORDER — SODIUM CHLORIDE 0.9 % IV SOLN
25.0000 ug/h | INTRAVENOUS | Status: DC
  Administered 2013-10-31: 350 ug/h via INTRAVENOUS
  Administered 2013-10-31 (×2): 400 ug/h via INTRAVENOUS
  Administered 2013-11-01: 350 ug/h via INTRAVENOUS
  Filled 2013-10-31 (×4): qty 50

## 2013-10-31 MED ORDER — ASPIRIN EC 81 MG PO TBEC
81.0000 mg | DELAYED_RELEASE_TABLET | Freq: Every day | ORAL | Status: DC
  Administered 2013-10-31: 81 mg via ORAL
  Filled 2013-10-31 (×3): qty 1

## 2013-10-31 MED ORDER — ARTIFICIAL TEARS OP OINT
1.0000 "application " | TOPICAL_OINTMENT | Freq: Three times a day (TID) | OPHTHALMIC | Status: DC
  Administered 2013-10-31: 1 via OPHTHALMIC
  Filled 2013-10-31: qty 3.5

## 2013-10-31 MED ORDER — SODIUM CHLORIDE 0.9 % IV SOLN
0.0000 mg/h | INTRAVENOUS | Status: DC
  Administered 2013-10-31: 2 mg/h via INTRAVENOUS
  Filled 2013-10-31: qty 10

## 2013-10-31 MED ORDER — BIOTENE DRY MOUTH MT LIQD
15.0000 mL | Freq: Four times a day (QID) | OROMUCOSAL | Status: DC
  Administered 2013-10-31 – 2013-11-01 (×5): 15 mL via OROMUCOSAL

## 2013-10-31 MED ORDER — ALBUTEROL (5 MG/ML) CONTINUOUS INHALATION SOLN
INHALATION_SOLUTION | RESPIRATORY_TRACT | Status: AC
  Filled 2013-10-31: qty 20

## 2013-10-31 MED ORDER — POTASSIUM CHLORIDE 10 MEQ/100ML IV SOLN
10.0000 meq | INTRAVENOUS | Status: AC
  Administered 2013-10-31 (×3): 10 meq via INTRAVENOUS
  Filled 2013-10-31 (×3): qty 100

## 2013-10-31 MED ORDER — LEVOFLOXACIN IN D5W 750 MG/150ML IV SOLN
750.0000 mg | INTRAVENOUS | Status: DC
  Administered 2013-10-31 – 2013-11-02 (×3): 750 mg via INTRAVENOUS
  Filled 2013-10-31 (×4): qty 150

## 2013-10-31 MED ORDER — ALBUTEROL (5 MG/ML) CONTINUOUS INHALATION SOLN
15.0000 mg/h | INHALATION_SOLUTION | RESPIRATORY_TRACT | Status: DC
  Administered 2013-10-31: 15 mg/h via RESPIRATORY_TRACT
  Filled 2013-10-31: qty 20

## 2013-10-31 MED ORDER — SODIUM CHLORIDE 0.9 % IV SOLN: 250.0000 mL | INTRAVENOUS | Status: DC | PRN

## 2013-10-31 MED ORDER — SUCCINYLCHOLINE CHLORIDE 20 MG/ML IJ SOLN
INTRAMUSCULAR | Status: AC
  Administered 2013-10-31: 100 mg
  Filled 2013-10-31: qty 1

## 2013-10-31 NOTE — ED Notes (Signed)
Bed: RESA Expected date:  Expected time:  Means of arrival:  Comments: EMS resp distress 

## 2013-10-31 NOTE — Progress Notes (Signed)
[place], RN, BSN, CCM  [phone]  Chart Reviewed for discharge and hospital needs.  Discharge needs at time of review: None present will follow for needs.  Review of patient progress due on [id number].

## 2013-10-31 NOTE — H&P (Signed)
STAFF NOTE: I, Dr Ann Lions have personally reviewed patient's available data, including medical history, events of note, physical examination and test results as part of my evaluation. I have discussed with resident/NP and other care providers such as pharmacist, RN and RRT.  In addition,  I personally evaluated patient and elicited key findings of - severe ARDS - high pretest prob for this dx. Given smoking, asthma, and summer hx: suspect PIV3, rhinovirus, coronavirus as likely etiologies. Dobut AIP, or AEP. Will Rx with ARDS protocol. Start nimbex 48h if PF ratio is </= 120. Might need to consider proning as well. Check autoimmune.  Rest per NP/medical resident whose note is outlined above and that I agree with  The patient is critically ill with multiple organ systems failure and requires high complexity decision making for assessment and support, frequent evaluation and titration of therapies, application of advanced monitoring technologies and extensive interpretation of multiple databases.   Critical Care Time devoted to patient care services described in this note is  45  Minutes.  Dr. Brand Males, M.D., [facility].C.P Pulmonary and Critical Care Medicine Staff Physician [facility] Pager: [phone], If no answer or between  15:00h - 7:00h: call [phone]  10/31/2013 11:10 AM

## 2013-10-31 NOTE — Procedures (Signed)
    Central Venous Catheter Insertion Procedure Note Denise Ray [id number] 06/04/1970  Procedure: Insertion of Central Venous Catheter Indications: Assessment of intravascular volume, Drug and/or fluid administration and Frequent blood sampling  Procedure Details Consent: Unable to obtain consent because of emergent medical necessity. Time Out: Verified patient identification, verified procedure, site/side was marked, verified correct patient position, special equipment/implants available, medications/allergies/relevent history reviewed, required imaging and test results available.  Performed  Real time [place] image of Right IJ prior to cannulation   Maximum sterile technique was used including antiseptics, cap, gloves, gown, hand hygiene, mask and sheet. Skin prep: Chlorhexidine; local anesthetic administered A antimicrobial bonded/coated triple lumen catheter was placed in the right internal jugular vein using the Seldinger technique.  Evaluation Blood flow good Complications: No apparent complications Patient did tolerate procedure well. Chest X-ray ordered to verify placement.  CXR: pending.  Denise Ray 10/31/2013, 10:48 AM

## 2013-10-31 NOTE — H&P (Signed)
PULMONARY / CRITICAL CARE MEDICINE   Name: Denise Ray MRN: [id number] DOB: 10-07-1970    ADMISSION DATE:  10/31/2013 CONSULTATION DATE:  6/16   PRIMARY SERVICE: PCCM   CHIEF COMPLAINT:  Acute respiratory failure   BRIEF PATIENT DESCRIPTION:  24 yof just d/c from [place] on 6/12 w/ dx of CAP and asthmatic exacerbation (sent home on azith/amoxicillin and pred taper). Presented back to [facility] the am of 6/16 with worsening pulmonary infiltrates and acute respiratory failure.   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 Just discharged from [facility] on 6/12 after being admitted on the 11th w/ CC: 6 d h/o cough, yellow beige sputum, low grade fever, chills, chest tightness. Has reported h/o asthma. Took SABA at home w/out improvement so presented to ER. On arrival found to have diffuse pulmonary infiltrates, high pitch wheeze and acute resp distress. Was admitted to medical service. Treated w/ empiric abx, systemic steroids and scheduled bds to treat possible CAP and asthmatic exacerbation. She was d/c to home on 6/12 as she felt better and was considering leaving AMA as she was not allowed to leave the ward to smoke. She was d/c to home on amoxicillin and azithro as well as pred taper.  Presented back to the ER 6/16  With worsening shortness of breath. Rapidly progressive over the 3 hour prior to presentation. On arrival was noted to be in acute respiratory distress. Initial exam notable for diffuse rales and upper field wheeze.  CXR showed some worsening diffuse pulmonary infiltrates. Failed attempt at CPAP. PCCM asked to admit for respiratory failure.   PAST MEDICAL HISTORY :  Past Medical History  Diagnosis Date  . History of abnormal  cervical Pap smear   . Hypothyroidism        . Asthma     seasonal- rare inhaler use  . Anxiety     no meds  . Depression     no meds  . TIA (transient ischemic attack)     from BCP  . GERD (gastroesophageal reflux disease)   . PTSD (post-traumatic stress disorder)   . History of attempted suicide   . Cervical cancer    Past Surgical History  Procedure Laterality Date  . Conization of cervix  2000  . Hysteroscopy w/d&c  4/09  . Chest wall/lung mass resection  7/03  . Y catheter    . Robotic assisted total hysterectomy N/A 10/11/2012    Procedure: ROBOTIC ASSISTED TOTAL HYSTERECTOMY;  Surgeon: Lyman Speller, MD;  Location: [facility];  Service: Gynecology;  Laterality: N/A;  . Salpingoophorectomy Bilateral 10/11/2012    Procedure: SALPINGO OOPHORECTOMY;  Surgeon: Lyman Speller, MD;  Location: [facility] [facility];  Service: Gynecology;  Laterality: Bilateral;  . Dilation and curettage of uterus    . Hysteroscopy  08/29/2003  . Abdominal hysterectomy    . Video assisted thoracoscopy (vats)/thorocotomy     Prior to Admission medications   Medication Sig Start Date End Date Taking? Authorizing Provider  albuterol (PROVENTIL HFA;VENTOLIN HFA) 108 (90 BASE) MCG/ACT inhaler Inhale 2 puffs into the lungs every 6 (six) hours as needed. wheezing 01/06/13  Yes Lysbeth Penner, FNP  albuterol (PROVENTIL) (5 MG/ML) 0.5% nebulizer solution Take 2.5 mg by nebulization every  6 (six) hours as needed for wheezing or shortness of breath.   Yes Historical Provider, MD  ALPRAZolam Duanne Moron) 1 MG tablet Take 1 mg by mouth 4 (four) times daily as needed for anxiety.   Yes Historical Provider, MD  amoxicillin (AMOXIL) 875 MG tablet Take 1 tablet (875 mg total) by mouth 2 (two) times daily. 10/27/13  Yes Velvet Bathe, MD  aspirin EC 81 MG tablet Take 81 mg by mouth daily.   Yes Historical Provider, MD  azithromycin (ZITHROMAX) 250 MG tablet Take 250-500 mg by mouth daily. Take 500mg  on day 1 then take 250mg  daily  on days 2-5   Yes Historical Provider, MD  benzonatate (TESSALON PERLES) 100 MG capsule Take 1 capsule (100 mg total) by mouth 3 (three) times daily as needed for cough. 10/27/13  Yes Velvet Bathe, MD  HYDROcodone-acetaminophen (NORCO/VICODIN) 5-325 MG per tablet Take 1 tablet by mouth every 6 (six) hours as needed for moderate pain.   Yes Historical Provider, MD  levothyroxine (SYNTHROID, LEVOTHROID) 50 MCG tablet Take 50 mcg by mouth daily before breakfast.   Yes Historical Provider, MD  pantoprazole (PROTONIX) 40 MG tablet Take 40 mg by mouth daily.   Yes Historical Provider, MD  predniSONE (DELTASONE) 50 MG tablet Take 50 mg by mouth daily with breakfast.   Yes Historical Provider, MD   Allergies  Allergen Reactions  . Estrogens Other (See Comments)    Stroke symptoms  . Morphine And Related Other (See Comments)    Sensitivity and does not alleviate pain  . Tamiflu Cough  . Wellbutrin [Bupropion] Other (See Comments)    Caused TIA combined with estrogen    FAMILY HISTORY:  Family History  Problem Relation Age of Onset  . Breast cancer Brother 15  . Breast cancer Maternal Aunt   . Depression Mother   . Alcohol abuse Father   . Alcohol abuse Cousin    SOCIAL HISTORY:  reports that she has been smoking Cigarettes.  She has been smoking about 1.00 pack per day. She has never used smokeless tobacco. She reports that she does not drink alcohol or use illicit drugs.  REVIEW OF SYSTEMS:  Unable d  SUBJECTIVE:  Sedated  VITAL SIGNS: Temp:  [98.1 F (36.7 C)] 98.1 F (36.7 C) (06/16 0606) Pulse Rate:  [71-91] 91 (06/16 0741) Resp:  [13-20] 20 (06/16 0741) BP: (141-146)/(65-78) 146/78 mmHg (06/16 0741) SpO2:  [100 %] 100 % (06/16 0741) FiO2 (%):  [40 %-100 %] 100 % (06/16 0820) HEMODYNAMICS:   VENTILATOR SETTINGS: Vent Mode:  [-] PRVC FiO2 (%):  [40 %-100 %] 100 % Set Rate:  [20 bmp] 20 bmp Vt Set:  [500 mL] 500 mL PEEP:  [7 cmH20] 7 cmH20 Plateau Pressure:  [25 cmH20] 25  cmH20 INTAKE / OUTPUT: Intake/Output   None     PHYSICAL EXAMINATION: General:  Obese white female, now sedated on vent  Neuro:  Sedated  HEENT:  Orally intubated #7 ETT. Some bloody secretions in mouth  Cardiovascular:  Tachy rrr Lungs:  Scattered rales/rhonchi  Abdomen:  Obese and w/out organomegaly  Musculoskeletal:  Intact  Skin:  Intact   LABS:  CBC  Recent Labs Lab 10/26/13 1409 10/31/13 0615 10/31/13 0627  WBC 9.4 16.8*  --   HGB 14.3 14.7 15.6*  HCT 42.2 43.1 46.0  PLT 289 337  --    Coag's No results found for this basename: APTT, INR,  in the last 168 hours BMET  Recent Labs Lab 10/26/13  1409 10/31/13 0627  NA 140 143  K 3.2* 3.3*  CL 102 102  CO2 20  --   BUN 5* 13  CREATININE 0.64 0.80  GLUCOSE 116* 102*   Electrolytes  Recent Labs Lab 10/26/13 1409  CALCIUM 9.6   Sepsis Markers  Recent Labs Lab 10/31/13 0625  LATICACIDVEN 2.28*   ABG  Recent Labs Lab 10/31/13 0608  PHART 7.562*  PCO2ART 26.4*  PO2ART 217.0*   Liver Enzymes No results found for this basename: AST, ALT, ALKPHOS, BILITOT, ALBUMIN,  in the last 168 hours Cardiac Enzymes  Recent Labs Lab 10/26/13 1409 10/31/13 0615  TROPONINI <0.30  --   PROBNP 95.1 80.6   Glucose No results found for this basename: GLUCAP,  in the last 168 hours  Imaging Dg Chest Portable 1 View  10/31/2013   CLINICAL DATA:  Respiratory distress. Shortness of breath and chest pain. History of smoking.  EXAM: PORTABLE CHEST - 1 VIEW  COMPARISON:  Chest radiograph performed 10/26/2013  FINDINGS: The lungs are mildly hypoexpanded. Vascular crowding and vascular congestion are seen. Mildly increased interstitial markings may reflect mild pulmonary edema. No pleural effusion or pneumothorax is seen.  The cardiomediastinal silhouette is mildly enlarged. No acute osseous abnormalities are identified.  IMPRESSION: Lungs mildly hypoexpanded. Vascular congestion and mild cardiomegaly noted. Mildly  increased interstitial markings may reflect mild pulmonary edema.   Electronically Signed   By: Garald Balding M.D.   On: 10/31/2013 06:19     CXR: worsening bilateral airspace disease   ASSESSMENT / PLAN:  PULMONARY A:  Acute Hypoxic respiratory failure in setting of diffuse pulmonary infiltrates. Evolving ARDS Top on diff dx include: HCAP vs viral pneumonitis  H/O asthma: does not appear to be in bronchospasm. Wonder if she has true airflow limitations or VCD P:   ARDS protocol  PAD protocol  See ID section  See neuro re: RASS goal  Scheduled BDs/ICS Will hold off on steroids for now   CARDIOVASCULAR A:  SIRS/sepsis HTN P:  Ck cvp: goal 8-12 MAP goal >65  Tele Repeat lactic acid Ck CEs and BNP If BNP elevated get echo.   RENAL A:   Hypokalemia  P:   Replace K Recheck chem Renal dose meds Avoid hypotension   GASTROINTESTINAL A:   Obesity   P:   Start early nutrition  PPI for SUP   HEMATOLOGIC A:   Leukocytosis   P:  Trend CBC   INFECTIOUS A:  HCAP vs viral PNA  P:  See above (flow sheet) Trend PCT F/u sputum culture and resp viral panel   ENDOCRINE A:   Hypothyroidism  P:   Cont synthroid  Trend glucose/ SSI ordered   NEUROLOGIC A:   Anxiety/ pain High risk for delirium P:   RASS goal: -3 PAS protocol Supportive care  TODAY'S SUMMARY:  Intubated for acute respiratory distress. Suspect that this is either HCAp or viral pneumonitis. Will place her on ARDS protocol. Might need to consider cardiac component as well.   I have personally obtained a history, examined the patient, evaluated laboratory and imaging results, formulated the assessment and plan and placed orders. CRITICAL CARE: The patient is critically ill with multiple organ systems failure and requires high complexity decision making for assessment and support, frequent evaluation and titration of therapies, application of advanced monitoring technologies and extensive  interpretation of multiple databases. Critical Care Time devoted to patient care services described in this note is ---minutes.    Pulmonary and  [place] Pager: [phone]  10/31/2013, 8:39 AM

## 2013-10-31 NOTE — Progress Notes (Signed)
ANTIBIOTIC CONSULT NOTE - INITIAL  Pharmacy Consult for vanc, zosyn and levaquin Indication: pneumonia  Allergies  Allergen Reactions  . Estrogens Other (See Comments)    Stroke symptoms  . Morphine And Related Other (See Comments)    Sensitivity and does not alleviate pain  . Tamiflu Cough  . Wellbutrin [Bupropion] Other (See Comments)    Caused TIA combined with estrogen    Patient Measurements: Height: 5\' 5"  (165.1 cm) IBW/kg (Calculated) : 57  Vital Signs: Temp: 98.1 F (36.7 C) (06/16 0606) Temp src: Axillary (06/16 0606) BP: 146/78 mmHg (06/16 0741) Pulse Rate: 91 (06/16 0741)  Labs:  Recent Labs  10/31/13 0615 10/31/13 0627  WBC 16.8*  --   HGB 14.7 15.6*  PLT 337  --   CREATININE  --  0.80   Estimated Creatinine Clearance: 114.4 ml/min (by C-G formula based on Cr of 0.8).  Microbiology: Recent Results (from the past 720 hour(s))  CULTURE, BLOOD (ROUTINE X 2)     Status: None   Collection Time    10/26/13  4:07 PM      Result Value Ref Range Status   Specimen Description BLOOD LEFT ANTECUBITAL   Final   Special Requests BOTTLES DRAWN AEROBIC AND ANAEROBIC 5ML   Final   Culture  Setup Time     Final   Value: 10/26/2013 22:01     Performed at [facility]   Culture     Final   Value:        BLOOD CULTURE RECEIVED NO GROWTH TO DATE CULTURE WILL BE HELD FOR 5 DAYS BEFORE ISSUING A FINAL NEGATIVE REPORT     Performed at [facility]   Report Status PENDING   Incomplete  CULTURE, BLOOD (ROUTINE X 2)     Status: None   Collection Time    10/26/13  4:08 PM      Result Value Ref Range Status   Specimen Description BLOOD BLOOD LEFT FOREARM   Final   Special Requests BOTTLES DRAWN AEROBIC AND ANAEROBIC 5ML   Final   Culture  Setup Time     Final   Value: 10/26/2013 22:01     Performed at [facility]   Culture     Final   Value:        BLOOD CULTURE RECEIVED NO GROWTH TO DATE CULTURE WILL BE HELD FOR 5 DAYS BEFORE ISSUING A FINAL  NEGATIVE REPORT     Performed at [facility]   Report Status PENDING   Incomplete  CULTURE, RESPIRATORY (NON-EXPECTORATED)     Status: None   Collection Time    10/27/13  8:57 AM      Result Value Ref Range Status   Specimen Description TRACHEAL ASPIRATE   Final   Special Requests NONE   Final   Gram Stain     Final   Value: RARE WBC PRESENT, PREDOMINANTLY PMN     RARE SQUAMOUS EPITHELIAL CELLS PRESENT     FEW GRAM NEGATIVE RODS     FEW GRAM POSITIVE COCCI     IN PAIRS   Culture     Final   Value: Non-Pathogenic Oropharyngeal-type Flora Isolated.     Performed at [facility]   Report Status 10/29/2013 FINAL   Final    Medications:  Anti-infectives   Start     Dose/Rate Route Frequency Ordered Stop   10/31/13 0830  levofloxacin (LEVAQUIN) IVPB 750 mg     750 mg 100  mL/hr over 90 Minutes Intravenous Every 24 hours 10/31/13 0822     10/31/13 0630  vancomycin (VANCOCIN) IVPB 1000 mg/200 mL premix     1,000 mg 200 mL/hr over 60 Minutes Intravenous  Once 10/31/13 0624 10/31/13 0828   10/31/13 0630  piperacillin-tazobactam (ZOSYN) IVPB 3.375 g     3.375 g 100 mL/hr over 30 Minutes Intravenous  Once 10/31/13 0388 10/31/13 0729      Assessment: 3 yoF admitted 6/16 with acute respiratory failure.  She was recently discharged from [facility] on 6/12 with CAP and asthma exacerbation and was sent home with azithromycin, amoxicillin, and prednisone taper.  She now has worsening of pulmonary infiltrates and required intubation.  Pharmacy is consulted to dose vanc, zosyn and levaquin.  Tmax: 98.1  WBCs: 16.8  Renal: SCr 0.8, CrCl > 100 ml/min  Cultures pending  Goal of Therapy:  Vancomycin trough level 15-20 mcg/ml Appropriate abx dosing, eradication of infection.  Plan:   Levaquin 750 mg IV q24h  Zosyn 3.375g IV Q8H infused over 4hrs.  Vancomycin 1000mg  IV q12h.  Measure Vanc trough at steady state.  Follow up renal fxn and culture results.  Gretta Arab  PharmD, BCPS Pager [phone] 10/31/2013 9:26 AM

## 2013-10-31 NOTE — Progress Notes (Signed)
Pt ETT pulled back from 25cm to 23cm at lip.

## 2013-10-31 NOTE — Progress Notes (Signed)
eLink Physician-Brief Progress Note Patient Name: Denise Ray DOB: 02/03/1971 MRN: [id number]  Date of Service  10/31/2013   HPI/Events of Note   Call received from [facility] for PCCM consult Asthma flare vs acute pneumonitis  BIPAP   eICU Interventions  Instructed EDP to give magnesium per Dr. Chase Caller recommendation He will see patient this AM   Intervention Category Minor Interventions: Communication with other healthcare providers and/or family  Simonne Maffucci 10/31/2013, 6:55 AM

## 2013-10-31 NOTE — Procedures (Signed)
Intubation Procedure Note Denise Ray [id number] August 22, 1970  Procedure: Intubation Indications: Respiratory insufficiency  Procedure Details Consent: Risks of procedure as well as the alternatives and risks of each were explained to the (patient/caregiver).  Consent for procedure obtained. Time Out: Verified patient identification, verified procedure, site/side was marked, verified correct patient position, special equipment/implants available, medications/allergies/relevent history reviewed, required imaging and test results available.  Performed  Maximum sterile technique was used including antiseptics, cap, gloves, hand hygiene and mask.  MAC 3 glide scope blade/ # 7 OETT    Evaluation Hemodynamic Status: BP stable throughout; O2 sats: stable throughout Patient's Current Condition: stable Complications: No apparent complications Patient did tolerate procedure well. Chest X-ray ordered to verify placement.  CXR: pending.   BABCOCK,PETE 10/31/2013

## 2013-10-31 NOTE — ED Notes (Signed)
Pt presents via EMS with c/o respiratory distress. Pt was just discharged last week and diagnosed with pneumonia. Pt arrives with c/o shortness of breath, hx of asthma. Pt is currently on cpap placed by EMS, pt had wheezing in upper fields and crackles heard in lower lobes by EMS. Pt says that her breathing has gotten worse over the last three hours.

## 2013-10-31 NOTE — Procedures (Signed)
Staff note  - supervised procedure.   Dr. Brand Males, M.D., [facility].C.P Pulmonary and Critical Care Medicine Staff Physician [place] [facility] Pager: [phone], If no answer or between  15:00h - 7:00h: call [phone]  10/31/2013 11:08 AM

## 2013-10-31 NOTE — ED Notes (Signed)
CORRECTION IN DOCUMENTATION- SUCCINYLCHOLINE 100MG  IVP GIVEN AT 0804. ROCURONIUM 60MG  GIVEN IVP AT 0810 DURING INTUBATION VERBAL ORDER RAMASAMY MD

## 2013-10-31 NOTE — Procedures (Signed)
Arterial Catheter Insertion Procedure Note Denise Ray [id number] 04-16-1971  Procedure: Insertion of Arterial Catheter  Indications: Blood pressure monitoring and Frequent blood sampling  Procedure Details Consent: Unable to obtain consent because of altered level of consciousness. Time Out: Verified patient identification, verified procedure, site/side was marked, verified correct patient position, special equipment/implants available, medications/allergies/relevent history reviewed, required imaging and test results available.  Performed  Maximum sterile technique was used including antiseptics, cap, gloves, gown, hand hygiene, mask and sheet. Skin prep: Chlorhexidine; local anesthetic administered 20 gauge catheter was inserted into left radial artery using the Seldinger technique.  Evaluation Blood flow good; BP tracing good. Complications: No apparent complications.   BABCOCK,PETE 10/31/2013

## 2013-10-31 NOTE — Progress Notes (Addendum)
INITIAL NUTRITION ASSESSMENT  DOCUMENTATION CODES Per approved criteria  -Obesity grade 2   INTERVENTION:  Currently unable to start TF when patient in prone position.  Per nurse patient to be placed in prone position.  When TF appropriate, Recommend Vital High Protein at 20 ml/hr.  Increase 10 ml every 4 hours to goal of 55 ml/hr which will provide 1320 kcal, 113 gm protein, and 1155 ml free water daily.  This will meet 70% of estimated kcal needs and 100% estimated protein needs and meet ASPEN guidelines for high protein, hypocaloric feeding.  RD to follow.  NUTRITION DIAGNOSIS: Inadequate oral intake related to inability to eat as evidenced by npo status.   Goal: Enteral nutrition to provide 60-70% of estimated calorie needs (22-25 kcals/kg ideal body weight) and 100% of estimated protein needs, based on ASPEN guidelines for permissive underfeeding in critically ill obese individuals  Monitor:  Plan of care, TF initiation, labs, weight trend.  Reason for Assessment: Nutrition assessment with TF recommendations and recommendations.  43 y.o. female  Admitting Dx: <principal problem not specified>  ASSESSMENT: Patient admitted with acute respiratory failure receiving mechanical ventilation, ARDS, SIRS/sepsis, HTN, Hypokalemia, hypothyroidism, HCAP vs viral PNA.    D/C'd from [facility] on 6/12 with diagnosis of CAP and asthmatic exacerbation.  Presented back to [facility] 6/16 with worsening pulmonary infiltrates and acute respiratory failure.  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    6/16: Spoke with RN.  Patient to be placed in the prone position and receive paralytics.  Family not available.  Diet hx unknown but weight relatively stable per e-chart records.  Patient is currently intubated on ventilator support MV: 8.4 L/min Temp (24hrs), Avg:98.2 F (36.8 C), Min:98.1 F (36.7 C), Max:98.2 F (36.8 C)   Height: Ht Readings from Last 1 Encounters:  10/31/13 5\' 5"  (1.651  m)    Weight: Wt Readings from Last 1 Encounters:  10/31/13 238 lb 8.6 oz (108.2 kg)    Ideal Body Weight: 125 lbs  % Ideal Body Weight: 188  Wt Readings from Last 10 Encounters:  10/31/13 238 lb 8.6 oz (108.2 kg)  10/26/13 237 lb 3.2 oz (107.593 kg)  06/09/13 237 lb 9.6 oz (107.775 kg)  04/19/13 236 lb (107.049 kg)  03/29/13 243 lb (110.224 kg)  03/22/13 241 lb (109.317 kg)  02/17/13 243 lb (110.224 kg)  01/18/13 246 lb (111.585 kg)  01/06/13 245 lb 9.6 oz (111.403 kg)  11/10/12 241 lb 6.4 oz (109.498 kg)    Usual Body Weight: 237  % Usual Body Weight: 100  BMI:  Body mass index is 39.69 kg/(m^2).  Estimated Nutritional Needs: Kcal: 1877 Protein: 105-115 gm Fluid: >/=2.4L  Skin: intact  Diet Order: NPO  EDUCATION NEEDS: -No education needs identified at this time   Intake/Output Summary (Last 24 hours) at 10/31/13 1131 Last data filed at 10/31/13 1000  Gross per 24 hour  Intake      0 ml  Output    275 ml  Net   -275 ml    Last BM: unknown   Labs:   Recent Labs Lab 10/26/13 1409 10/31/13 0627  NA 140 143  K 3.2* 3.3*  CL 102 102  CO2 20  --   BUN 5* 13  CREATININE 0.64 0.80  CALCIUM 9.6  --   GLUCOSE 116* 102*    CBG (last 3)   Recent Labs  10/31/13 0922  GLUCAP 158*    Scheduled Meds: . antiseptic oral rinse  15 mL Mouth Rinse QID  . arformoterol  15 mcg Nebulization BID  . artificial tears  1 application Both Eyes 3 times per day  . aspirin EC  81 mg Oral Daily  . budesonide (PULMICORT) nebulizer solution  0.5 mg Nebulization BID  . chlorhexidine  15 mL Mouth Rinse BID  . cisatracurium  0.1 mg/kg Intravenous Once  . enoxaparin (LOVENOX) injection  40 mg Subcutaneous Q24H  . insulin aspart  0-20 Units Subcutaneous 6 times per day  . levofloxacin (LEVAQUIN) IV  750 mg Intravenous Q24H  . levothyroxine  50 mcg Per Tube QAC breakfast  . lidocaine (cardiac) 100 mg/32ml      . pantoprazole (PROTONIX) IV  40 mg Intravenous Daily   . phenylephrine 200 mcg / ml CONC. DILUTION INJ (ED / Urology USE ONLY)      . piperacillin-tazobactam (ZOSYN)  IV  3.375 g Intravenous 3 times per day  . vancomycin  1,000 mg Intravenous Q12H  . vancomycin  1,000 mg Intravenous Once    Continuous Infusions: . sodium chloride 100 mL/hr at 10/31/13 0916  . cisatracurium (NIMBEX) infusion      Past Medical History  Diagnosis Date  . History of abnormal cervical Pap smear   . Hypothyroidism        . Asthma     seasonal- rare inhaler use  . Anxiety     no meds  . Depression     no meds  . TIA (transient ischemic attack)     from BCP  . GERD (gastroesophageal reflux disease)   . PTSD (post-traumatic stress disorder)   . History of attempted suicide   . Cervical cancer     Past Surgical History  Procedure Laterality Date  . Conization of cervix  2000  . Hysteroscopy w/d&c  4/09  . Chest wall/lung mass resection  7/03  . Y catheter    . Robotic assisted total hysterectomy N/A 10/11/2012    Procedure: ROBOTIC ASSISTED TOTAL HYSTERECTOMY;  Surgeon: Lyman Speller, MD;  Location: [place] [facility];  Service: Gynecology;  Laterality: N/A;  . Salpingoophorectomy Bilateral 10/11/2012    Procedure: SALPINGO OOPHORECTOMY;  Surgeon: Lyman Speller, MD;  Location: [place] [facility];  Service: Gynecology;  Laterality: Bilateral;  . Dilation and curettage of uterus    . Hysteroscopy  08/29/2003  . Abdominal hysterectomy    . Video assisted thoracoscopy (vats)/thorocotomy      Antonieta Iba, RD, LDN Clinical Inpatient Dietitian Pager:  [phone] Weekend and after hours pager:  [phone]

## 2013-10-31 NOTE — Procedures (Signed)
Staff note Supervised procedure. Real time 2D ultrasound used for vein site selection, patency assessment, and needle entry. A record of image was made and submitted for record filing   Dr. Brand Males, M.D., [facility].C.P Pulmonary and Critical Care Medicine Staff Physician [place] [facility] Pager: [phone], If no answer or between  15:00h - 7:00h: call [phone]  10/31/2013 11:08 AM

## 2013-10-31 NOTE — Progress Notes (Signed)
Chaplain following.  Pt was sedated when chaplain rounding on unit.  Family not present.  Will attempt to make contact with family for support and will be available for staff support.  Please page as needs arise.   Denise Ray [place]

## 2013-10-31 NOTE — ED Provider Notes (Signed)
CSN: [id number]     Arrival date & time 10/31/13  0556 History   First MD Initiated Contact with Patient 10/31/13 0602     Chief Complaint  Patient presents with  . Respiratory Distress     (Consider location/radiation/quality/duration/timing/severity/associated sxs/prior Treatment) Patient is a 43 y.o. female presenting with shortness of breath. The history is provided by the EMS personnel. The history is limited by the condition of the patient.  Shortness of Breath Severity:  Severe Onset quality:  Gradual Timing:  Constant Progression:  Worsening Chronicity:  Recurrent Context: not animal exposure   Relieved by:  Nothing Worsened by:  Nothing tried Ineffective treatments:  Inhaler Associated symptoms: wheezing   Associated symptoms: no fever and no vomiting   Risk factors: no hx of cancer     Past Medical History  Diagnosis Date  . History of abnormal cervical Pap smear   . Hypothyroidism        . Asthma     seasonal- rare inhaler use  . Anxiety     no meds  . Depression     no meds  . TIA (transient ischemic attack)     from BCP  . GERD (gastroesophageal reflux disease)   . PTSD (post-traumatic stress disorder)   . History of attempted suicide   . Cervical cancer    Past Surgical History  Procedure Laterality Date  . Conization of cervix  2000  . Hysteroscopy w/d&c  4/09  . Chest wall/lung mass resection  7/03  . Y catheter    . Robotic assisted total hysterectomy N/A 10/11/2012    Procedure: ROBOTIC ASSISTED TOTAL HYSTERECTOMY;  Surgeon: Lyman Speller, MD;  Location: [facility];  Service: Gynecology;  Laterality: N/A;  . Salpingoophorectomy Bilateral 10/11/2012    Procedure: SALPINGO OOPHORECTOMY;  Surgeon: Lyman Speller, MD;  Location: [facility];  Service: Gynecology;  Laterality: Bilateral;  . Dilation and curettage of uterus    . Hysteroscopy  08/29/2003  . Abdominal hysterectomy    . Video assisted thoracoscopy (vats)/thorocotomy     Family  History  Problem Relation Age of Onset  . Breast cancer Brother 15  . Breast cancer Maternal Aunt   . Depression Mother   . Alcohol abuse Father   . Alcohol abuse Cousin    History  Substance Use Topics  . Smoking status: Light Tobacco Smoker -- 1.00 packs/day    Types: Cigarettes  . Smokeless tobacco: Never Used  . Alcohol Use: No   OB History   Grav Para Term Preterm Abortions TAB SAB Ect Mult Living   1 1             Review of Systems  Unable to perform ROS Constitutional: Negative for fever.  Respiratory: Positive for shortness of breath and wheezing.   Gastrointestinal: Negative for vomiting.      Allergies  Estrogens; Morphine and related; Tamiflu; and Wellbutrin  Home Medications   Prior to Admission medications   Medication Sig Start Date End Date Taking? Authorizing Provider  azithromycin (ZITHROMAX) 250 MG tablet Take 250-500 mg by mouth daily. Take 500mg  on day 1 then take 250mg  daily on days 2-5   Yes Historical Provider, MD  predniSONE (DELTASONE) 50 MG tablet Take 50 mg by mouth daily with breakfast.   Yes Historical Provider, MD  albuterol (PROVENTIL HFA;VENTOLIN HFA) 108 (90 BASE) MCG/ACT inhaler Inhale 2 puffs into the lungs every 6 (six) hours as needed. wheezing 01/06/13   Lysbeth Penner, FNP  albuterol (PROVENTIL) (5 MG/ML) 0.5% nebulizer solution Take 2.5 mg by nebulization every 6 (six) hours as needed for wheezing or shortness of breath.    Historical Provider, MD  amoxicillin (AMOXIL) 875 MG tablet Take 1 tablet (875 mg total) by mouth 2 (two) times daily. 10/27/13   Velvet Bathe, MD  benzonatate (TESSALON PERLES) 100 MG capsule Take 1 capsule (100 mg total) by mouth 3 (three) times daily as needed for cough. 10/27/13   Velvet Bathe, MD  clonazePAM (KLONOPIN) 1 MG tablet Take 1 tablet (1 mg total) by mouth 2 (two) times daily. 04/19/13 04/19/14  Levonne Spiller, MD  DULoxetine (CYMBALTA) 30 MG capsule Take 1 capsule (30 mg total) by mouth daily. 04/19/13  04/19/14  Levonne Spiller, MD  ibuprofen (ADVIL,MOTRIN) 400 MG tablet Take 400 mg by mouth every 6 (six) hours as needed for headache or moderate pain.     Historical Provider, MD  levothyroxine (SYNTHROID, LEVOTHROID) 50 MCG tablet Take 50 mcg by mouth daily before breakfast.    Historical Provider, MD  pantoprazole (PROTONIX) 40 MG tablet Take 40 mg by mouth daily.    Historical Provider, MD   BP 141/65  Pulse 73  Temp(Src) 98.1 F (36.7 C) (Axillary)  Resp 18  SpO2 100% Physical Exam  Constitutional: She appears well-developed and well-nourished.  HENT:  Head: Normocephalic and atraumatic.  Eyes: Conjunctivae are normal. Pupils are equal, round, and reactive to light.  Neck: Normal range of motion. Neck supple.  Cardiovascular: Normal rate, regular rhythm and intact distal pulses.   Pulmonary/Chest: No stridor. She is in respiratory distress. She has wheezes.  Abdominal: Soft. Bowel sounds are normal. There is no tenderness. There is no rebound and no guarding.  Musculoskeletal: Normal range of motion. She exhibits no edema.  Neurological: She is alert. She has normal reflexes.  Skin: Skin is warm and dry. She is not diaphoretic.  Psychiatric: She has a normal mood and affect.    ED Course  Procedures (including critical care time) Labs Review Labs Reviewed  CULTURE, BLOOD (ROUTINE X 2)  CULTURE, BLOOD (ROUTINE X 2)  BLOOD GAS, ARTERIAL  CBC WITH DIFFERENTIAL  PRO B NATRIURETIC PEPTIDE  I-STAT CHEM 8, ED  I-STAT TROPOININ, ED  I-STAT CG4 LACTIC ACID, ED    Imaging Review No results found.   EKG Interpretation   Date/Time:  Tuesday October 31 2013 06:08:20 EDT Ventricular Rate:  74 PR Interval:    QRS Duration: 88 QT Interval:  393 QTC Calculation: 436 R Axis:   18 Text Interpretation:  Normal sinus rhythm Confirmed by [facility]  MD,  APRIL ([id number]) on 10/31/2013 6:13:47 AM      MDM   Final diagnoses:  None    MDM Reviewed: previous chart, nursing note  and vitals Reviewed previous: labs and x-ray Interpretation: labs, ECG and x-ray (CHF, hypokalemia, elevation of white count) Total time providing critical care: 75-105 minutes. This excludes time spent performing separately reportable procedures and services. Consults: pulmonary   Medications  albuterol (PROVENTIL,VENTOLIN) solution continuous neb (15 mg/hr Nebulization New Bag/Given 10/31/13 0627)  vancomycin (VANCOCIN) IVPB 1000 mg/200 mL premix (1,000 mg Intravenous New Bag/Given 10/31/13 0728)  magnesium sulfate IVPB 2 g 50 mL (not administered)  potassium chloride 10 mEq in 100 mL IVPB (not administered)  furosemide (LASIX) injection 40 mg (40 mg Intravenous Given 10/31/13 0655)  piperacillin-tazobactam (ZOSYN) IVPB 3.375 g (0 g Intravenous Stopped 10/31/13 0729)  fentaNYL (SUBLIMAZE) injection 50 mcg (50 mcg Intravenous Given 10/31/13 0735)  CRITICAL CARE Performed by: Carlisle Beers Total critical care time: 90 minutes Critical care time was exclusive of separately billable procedures and treating other patients. Critical care was necessary to treat or prevent imminent or life-threatening deterioration. Critical care was time spent personally by me on the following activities: development of treatment plan with patient and/or surrogate as well as nursing, discussions with consultants, evaluation of patient's response to treatment, examination of patient, obtaining history from patient or surrogate, ordering and performing treatments and interventions, ordering and review of laboratory studies, ordering and review of radiographic studies, pulse oximetry and re-evaluation of patient's condition.   Carlisle Beers, MD 10/31/13 [phone]

## 2013-10-31 NOTE — Procedures (Signed)
Staff note  I tried 7.5 size tube with mach 3 blade on glidescope. Small mouth so was hard to negotiate through and access cord. At this time sux effect wore off and patient got agitated. So etomidate repeat and roc given and pateitn reintubated by NP with size 7 tube   Dr. Brand Males, M.D., [facility].C.P Pulmonary and Critical Care Medicine Staff Physician [place] [facility] Pager: [phone], If no answer or between  15:00h - 7:00h: call [phone]  10/31/2013 11:07 AM

## 2013-11-01 ENCOUNTER — Inpatient Hospital Stay (HOSPITAL_COMMUNITY): Payer: 59

## 2013-11-01 LAB — T-HELPER CELLS (CD4) COUNT (NOT AT ARMC)
CD4 T CELL HELPER: 40 % (ref 33–55)
CD4 T Cell Abs: 400 /uL (ref 400–2700)

## 2013-11-01 LAB — RESPIRATORY VIRUS PANEL
Adenovirus: NOT DETECTED
INFLUENZA A H1: NOT DETECTED
Influenza A H3: NOT DETECTED
Influenza A: NOT DETECTED
Influenza B: NOT DETECTED
METAPNEUMOVIRUS: NOT DETECTED
Parainfluenza 1: NOT DETECTED
Parainfluenza 2: NOT DETECTED
Parainfluenza 3: NOT DETECTED
Respiratory Syncytial Virus A: NOT DETECTED
Respiratory Syncytial Virus B: NOT DETECTED
Rhinovirus: DETECTED — AB

## 2013-11-01 LAB — CBC
HCT: 38.9 % (ref 36.0–46.0)
Hemoglobin: 12.4 g/dL (ref 12.0–15.0)
MCH: 29.5 pg (ref 26.0–34.0)
MCHC: 31.9 g/dL (ref 30.0–36.0)
MCV: 92.6 fL (ref 78.0–100.0)
PLATELETS: 188 10*3/uL (ref 150–400)
RBC: 4.2 MIL/uL (ref 3.87–5.11)
RDW: 14.3 % (ref 11.5–15.5)
WBC: 13 10*3/uL — ABNORMAL HIGH (ref 4.0–10.5)

## 2013-11-01 LAB — BLOOD GAS, ARTERIAL
ACID-BASE EXCESS: 2.8 mmol/L — AB (ref 0.0–2.0)
Acid-Base Excess: 2.9 mmol/L — ABNORMAL HIGH (ref 0.0–2.0)
Acid-Base Excess: 3.8 mmol/L — ABNORMAL HIGH (ref 0.0–2.0)
Bicarbonate: 29 mEq/L — ABNORMAL HIGH (ref 20.0–24.0)
Bicarbonate: 29.7 mEq/L — ABNORMAL HIGH (ref 20.0–24.0)
Bicarbonate: 29.1 mEq/L — ABNORMAL HIGH (ref 20.0–24.0)
DRAWN BY: 257701
Delivery systems: POSITIVE
Drawn by: 308601
Expiratory PAP: 7
FIO2: 0.4 %
FIO2: 0.6 %
Inspiratory PAP: 14
LHR: 20 {breaths}/min
MECHVT: 400 mL
O2 Content: 4 L/min
O2 Saturation: 97.9 %
O2 Saturation: 98.9 %
O2 Saturation: 95.8 %
PCO2 ART: 53.6 mmHg — AB (ref 35.0–45.0)
PCO2 ART: 55.3 mmHg — AB (ref 35.0–45.0)
PEEP: 5 cmH2O
PH ART: 7.368 (ref 7.350–7.450)
PO2 ART: 106 mmHg — AB (ref 80.0–100.0)
PO2 ART: 145 mmHg — AB (ref 80.0–100.0)
PO2 ART: 81.1 mmHg (ref 80.0–100.0)
Patient temperature: 98.6
Patient temperature: 98.6
Patient temperature: 98.6
TCO2: 26.4 mmol/L (ref 0–100)
TCO2: 26.9 mmol/L (ref 0–100)
TCO2: 26.7 mmol/L (ref 0–100)
pCO2 arterial: 52.9 mmHg — ABNORMAL HIGH (ref 35.0–45.0)
pH, Arterial: 7.352 (ref 7.350–7.450)
pH, Arterial: 7.341 — ABNORMAL LOW (ref 7.350–7.450)

## 2013-11-01 LAB — CULTURE, BLOOD (ROUTINE X 2)
CULTURE: NO GROWTH
Culture: NO GROWTH

## 2013-11-01 LAB — ANTI-SCLERODERMA ANTIBODY: Scleroderma (Scl-70) (ENA) Antibody, IgG: <1

## 2013-11-01 LAB — BASIC METABOLIC PANEL
BUN: 16 mg/dL (ref 6–23)
CALCIUM: 8.4 mg/dL (ref 8.4–10.5)
CO2: 31 mEq/L (ref 19–32)
CREATININE: 0.84 mg/dL (ref 0.50–1.10)
Chloride: 99 mEq/L (ref 96–112)
GFR calc Af Amer: >90 mL/min (ref >90)
GFR, EST NON AFRICAN AMERICAN: 86 mL/min — AB (ref >90)
GLUCOSE: 88 mg/dL (ref 70–99)
Potassium: 3.8 mEq/L (ref 3.7–5.3)
Sodium: 140 mEq/L (ref 137–147)

## 2013-11-01 LAB — ANCA SCREEN W REFLEX TITER
Atypical p-ANCA Screen: NEGATIVE
c-ANCA Screen: NEGATIVE
p-ANCA Screen: NEGATIVE

## 2013-11-01 LAB — GLUCOSE, CAPILLARY
Glucose-Capillary: 85 mg/dL (ref 70–99)
Glucose-Capillary: 86 mg/dL (ref 70–99)
Glucose-Capillary: 87 mg/dL (ref 70–99)
Glucose-Capillary: 94 mg/dL (ref 70–99)
Glucose-Capillary: 94 mg/dL (ref 70–99)
Glucose-Capillary: 96 mg/dL (ref 70–99)

## 2013-11-01 LAB — ANTI-DNA ANTIBODY, DOUBLE-STRANDED: ds DNA Ab: <1 IU/mL

## 2013-11-01 LAB — MPO/PR-3 (ANCA) ANTIBODIES
Myeloperoxidase Abs: <1
Serine Protease 3: <1

## 2013-11-01 LAB — PROCALCITONIN: Procalcitonin: <0.1 ng/mL

## 2013-11-01 LAB — VANCOMYCIN, TROUGH: VANCOMYCIN TR: 11.5 ug/mL (ref 10.0–20.0)

## 2013-11-01 LAB — LACTIC ACID, PLASMA: Lactic Acid, Venous: 1.3 mmol/L (ref 0.5–2.2)

## 2013-11-01 LAB — ANA: ANA: NEGATIVE

## 2013-11-01 IMAGING — CR DG CHEST 1V PORT
1 series · 1 of 1 positions shown · non-contrast
Comparison: Chest radiograph chest CT [DATE]

CLINICAL DATA: Endotracheal tube

EXAM:
PORTABLE CHEST - 1 VIEW

[AP]
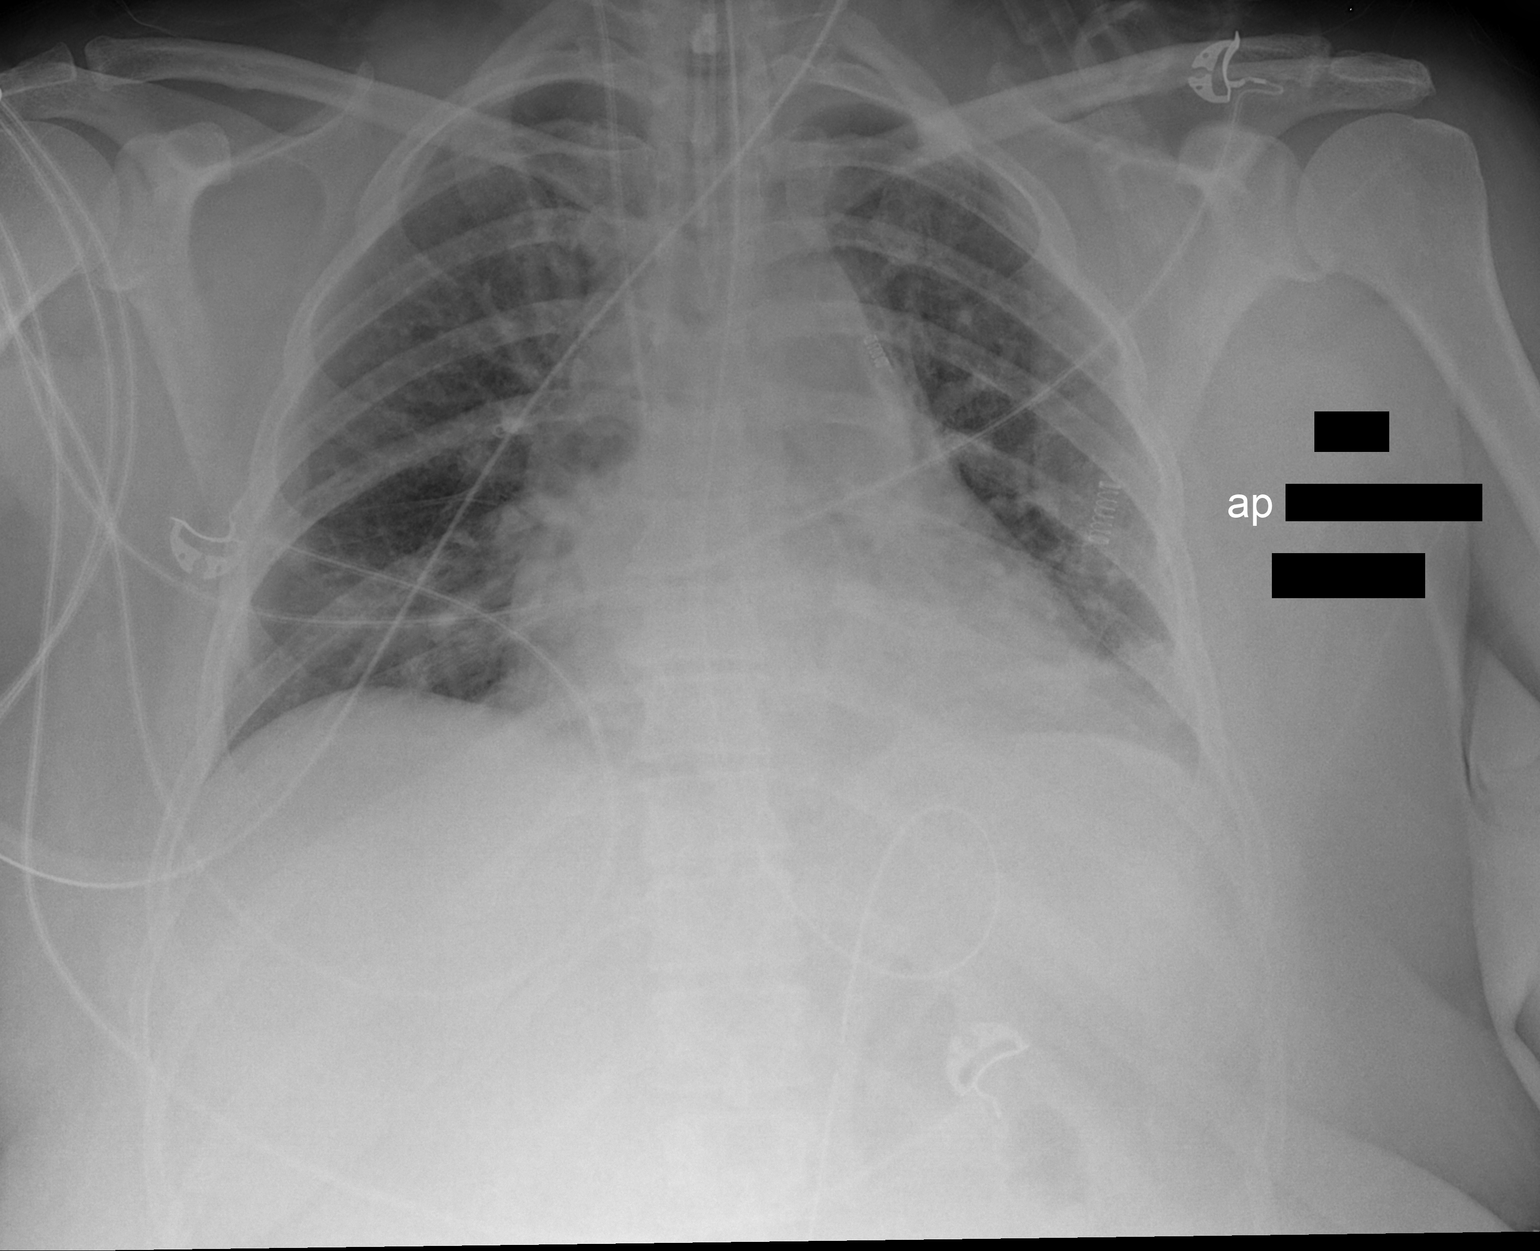

[1 of 1 positions shown; findings below may reference images not displayed]

FINDINGS: Endotracheal tube in position 3 cm from carina. NG tube extends to
the stomach. Right central venous line with tip in the distal SVC.
Normal cardiac silhouette. There is bibasilar atelectasis similar
prior. Central venous congestion noted. No pneumothorax.
IMPRESSION: 1. Endotracheal tube in good position.
2. Bibasilar atelectasis.
3. Central venous congestion.

## 2013-11-01 MED ORDER — ONDANSETRON HCL 4 MG/2ML IJ SOLN
4.0000 mg | Freq: Four times a day (QID) | INTRAMUSCULAR | Status: DC | PRN
  Administered 2013-11-01 – 2013-11-02 (×2): 4 mg via INTRAVENOUS
  Filled 2013-11-01 (×2): qty 2

## 2013-11-01 MED ORDER — RACEPINEPHRINE HCL 2.25 % IN NEBU
0.5000 mL | INHALATION_SOLUTION | Freq: Once | RESPIRATORY_TRACT | Status: AC
  Administered 2013-11-01: 0.5 mL via RESPIRATORY_TRACT

## 2013-11-01 MED ORDER — RACEPINEPHRINE HCL 2.25 % IN NEBU
INHALATION_SOLUTION | RESPIRATORY_TRACT | Status: AC
  Filled 2013-11-01: qty 0.5

## 2013-11-01 MED ORDER — BIOTENE DRY MOUTH MT LIQD: 15.0000 mL | Freq: Two times a day (BID) | OROMUCOSAL | Status: DC

## 2013-11-01 MED ORDER — VANCOMYCIN HCL 10 G IV SOLR
1250.0000 mg | Freq: Two times a day (BID) | INTRAVENOUS | Status: DC
  Administered 2013-11-02: 1250 mg via INTRAVENOUS
  Filled 2013-11-01: qty 1250

## 2013-11-01 MED ORDER — PHENOL 1.4 % MT LIQD
1.0000 | OROMUCOSAL | Status: DC | PRN
  Administered 2013-11-01 (×2): 1 via OROMUCOSAL
  Filled 2013-11-01: qty 177

## 2013-11-01 MED ORDER — CHLORHEXIDINE GLUCONATE 0.12 % MT SOLN
15.0000 mL | Freq: Two times a day (BID) | OROMUCOSAL | Status: DC
  Administered 2013-11-01: 15 mL via OROMUCOSAL
  Filled 2013-11-01 (×2): qty 15

## 2013-11-01 MED ORDER — ONDANSETRON 8 MG/NS 50 ML IVPB
8.0000 mg | Freq: Four times a day (QID) | INTRAVENOUS | Status: DC | PRN
  Administered 2013-11-02: 8 mg via INTRAVENOUS
  Filled 2013-11-01: qty 8

## 2013-11-01 MED ORDER — ONDANSETRON HCL 4 MG/2ML IJ SOLN: 4.0000 mg | Freq: Four times a day (QID) | INTRAMUSCULAR | Status: DC | PRN

## 2013-11-01 MED ORDER — ASPIRIN 81 MG PO CHEW
81.0000 mg | CHEWABLE_TABLET | Freq: Every day | ORAL | Status: DC
  Administered 2013-11-01 – 2013-11-05 (×4): 81 mg via ORAL
  Filled 2013-11-01 (×5): qty 1

## 2013-11-01 MED ORDER — ALBUTEROL SULFATE (2.5 MG/3ML) 0.083% IN NEBU
INHALATION_SOLUTION | RESPIRATORY_TRACT | Status: AC
  Administered 2013-11-01: 10:00:00
  Filled 2013-11-01: qty 3

## 2013-11-01 MED ORDER — ALBUTEROL SULFATE (2.5 MG/3ML) 0.083% IN NEBU
2.5000 mg | INHALATION_SOLUTION | RESPIRATORY_TRACT | Status: DC | PRN
  Administered 2013-11-01 – 2013-11-03 (×3): 2.5 mg via RESPIRATORY_TRACT
  Filled 2013-11-01 (×3): qty 3

## 2013-11-01 NOTE — Progress Notes (Signed)
RT Note: RT was called to place patient back on Bipap. RN had been taken her off of Bipap to see if she would tolerate it but she did not tolerate being off it. Upon arriving to the room the nurse had already placed her back on the [place]. RT corrected the BIPAP settings and she is currently on Bipap, Ipap-14, Epap-7, Fio2-60%, Back up rate-15 her original settings. She is tolerating those settings well and RT will continue to monitor.

## 2013-11-01 NOTE — Progress Notes (Signed)
PULMONARY / CRITICAL CARE MEDICINE   Name: Denise Ray MRN: [id number] DOB: 08/11/1970    ADMISSION DATE:  10/31/2013 CONSULTATION DATE:  6/16  PRIMARY SERVICE: PCCM  CHIEF COMPLAINT:  Acute respiratory failure  BRIEF PATIENT DESCRIPTION: 43 yo female just d/c from [facility] on 6/12 w/ dx of CAP and asthmatic exacerbation (sent home on azith/amoxicillin and pred taper). Presented back to [facility] 6/16 with worsening pulmonary infiltrates and acute respiratory failure.  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 Just discharged from [facility] on 6/12 after being admitted on the 11th w/ CC: 6 d h/o cough, yellow beige sputum, low grade fever, chills, chest tightness. Has reported h/o asthma. Took SABA at home w/out improvement so presented to ER. On arrival found to have diffuse pulmonary infiltrates, high pitch wheeze and acute resp distress. Was admitted to medical service. Treated w/ empiric abx, systemic steroids and scheduled bds to treat possible CAP and asthmatic exacerbation. She was d/c to home on 6/12 as she felt better and was considering leaving AMA as she was not allowed to leave the ward to smoke. She was d/c to home on amoxicillin and azithro as well as pred taper.  Presented back to the ER 6/16 With worsening shortness of breath. Rapidly progressive over the 3 hour prior to presentation. On arrival was noted to be in acute respiratory distress. Initial exam notable for diffuse rales and upper field wheeze. CXR showed some worsening diffuse pulmonary infiltrates. Failed attempt at CPAP. PCCM  asked to admit for respiratory failure.   REVIEW OF SYSTEMS:  Sedated and intubated  SUBJECTIVE AND OVERNIGHT: No events overnight, able to decrease sedation   VITAL SIGNS: Temp:  [97.5 F (36.4 C)-98.5 F (36.9 C)] 97.9 F (36.6 C) (06/17 0800) Pulse Rate:  [54-102] 55 (06/17 0800) Resp:  [17-23] 20 (06/17 0800) BP: (93-117)/(40-63) 107/53 mmHg (06/17 0418) SpO2:  [94 %-100 %] 98 % (06/17 0800) FiO2 (%):  [0.7 %-100 %] 40 % (06/17 0800) Weight:  [108.4 kg (238 lb 15.7 oz)] 108.4 kg (238 lb 15.7 oz) (06/17 0417) HEMODYNAMICS:   VENTILATOR SETTINGS: Vent Mode:  [-] PRVC FiO2 (%):  [0.7 %-100 %] 40 % Set Rate:  [20 bmp] 20 bmp Vt Set:  [400 mL] 400 mL PEEP:  [5 cmH20-10 cmH20] 5 cmH20 Plateau Pressure:  [14 cmH20-26 cmH20] 14 cmH20 INTAKE / OUTPUT: Intake/Output     06/16 0701 - 06/17 0700 06/17 0701 - 06/18 0700   I.V. (mL/kg) 2920.9 (26.9) 284 (2.6)   IV Piggyback 1000 200   Total Intake(mL/kg) 3920.9 (36.2) 484 (4.5)   Urine (mL/kg/hr) 2140 (0.8) 90 (0.3)   Total Output 2140 90   Net +1780.9 +394          PHYSICAL EXAMINATION: General: acutely ill appearing female Neuro: sedated, follows commands, MAEs HEENT: intubated Cardiovascular: regular, no murmurs Lungs: wheezing throughout Abdomen: obese, soft, non-distended, nontender Musculoskeletal: no deformity Skin: intact  LABS:  CBC  Recent Labs Lab 10/26/13 1409 10/31/13 0615 10/31/13 0627 10/31/13 1100  WBC 9.4 16.8*  --  13.2*  HGB 14.3  14.7 15.6* 13.4  HCT 42.2 43.1 46.0 40.1  PLT 289 337  --  307   Coag's No results found for this basename: APTT, INR,  in the last 168 hours BMET  Recent Labs Lab 10/26/13 1409 10/31/13 0627 10/31/13 1100  NA 140 143  --   K 3.2* 3.3*  --   CL 102 102  --   CO2 20  --   --   BUN 5* 13  --   CREATININE 0.64 0.80 0.82  GLUCOSE 116* 102*  --    Electrolytes  Recent Labs Lab 10/26/13 1409  CALCIUM 9.6   Sepsis Markers  Recent Labs Lab  10/31/13 0625 10/31/13 1100 11/01/13 0425  LATICACIDVEN 2.28*  --   --   PROCALCITON  --  <0.10 <0.10   ABG  Recent Labs Lab 10/31/13 1040 10/31/13 1320 11/01/13 0451  PHART 7.380 7.341* 7.352  PCO2ART 46.6* 50.2* 53.6*  PO2ART 141.0* 198.0* 106.0*   Liver Enzymes No results found for this basename: AST, ALT, ALKPHOS, BILITOT, ALBUMIN,  in the last 168 hours Cardiac Enzymes  Recent Labs Lab 10/26/13 1409 10/31/13 0615  TROPONINI <0.30  --   PROBNP 95.1 80.6   Glucose  Recent Labs Lab 10/31/13 0922 10/31/13 1209 10/31/13 1523 10/31/13 2014 10/31/13 2323 11/01/13 0349  GLUCAP 158* 157* 124* 123* 105* 86    Imaging Ct Angio Chest Pe W/cm &/or Wo Cm  10/31/2013   CLINICAL DATA:  ARDS. Evaluate for a pulmonary embolism. History of cervical cancer.  EXAM: CT ANGIOGRAPHY CHEST WITH CONTRAST  TECHNIQUE: Multidetector CT imaging of the chest was performed using the standard protocol during bolus administration of intravenous contrast. Multiplanar CT image reconstructions and MIPs were obtained to evaluate the vascular anatomy.  CONTRAST:  151mL OMNIPAQUE IOHEXOL 350 MG/ML SOLN  COMPARISON:  No priors.  FINDINGS: Mediastinum: No filling defects within the pulmonary arterial tree to suggest underlying pulmonary embolism. Heart size is mildly enlarged. There is no significant pericardial fluid, thickening or pericardial calcification. No pathologically enlarged mediastinal or hilar lymph nodes. Esophagus is unremarkable in appearance. Nasogastric tube extends into the stomach (tipped of tube is below the lower margin of the images). Endotracheal tube in position with tip at the level of the aortic arch. Right IJ central venous catheter with tip terminating at the superior cavoatrial junction.  Lungs/Pleura: There is a combination of volume loss and airspace consolidation in the dependent portions of the lower lobes of the lungs bilaterally, the appearance of which is suggestive of  recent massive aspiration. Lungs are otherwise relatively clear, with exception of some atelectasis and consolidation in the inferior segment of the lingula. No pleural effusions.  Upper Abdomen: Unremarkable.  Musculoskeletal: There are no aggressive appearing lytic or blastic lesions noted in the visualized portions of the skeleton.  Review of the MIP images confirms the above findings.  IMPRESSION: 1. No evidence of pulmonary embolism. 2. The appearance of the lungs is strongly suggestive of a recent massive aspiration event, as discussed above.   Electronically Signed   By: Vinnie Langton M.D.   On: 10/31/2013 14:46   Dg Chest Port 1 View  10/31/2013   CLINICAL DATA:  Central catheter placement  EXAM: PORTABLE CHEST - 1 VIEW  COMPARISON:  Study obtained earlier in the day  FINDINGS: Central catheter tip is in the superior vena cava near the cavoatrial junction. Endotracheal tube tip is 3.6 cm above the carina. Nasogastric tube tip and side port are in  the stomach. There is a small area of consolidation in the left base, new. Lungs elsewhere are clear. Heart is mildly enlarged with normal pulmonary vascularity.  IMPRESSION: The tube and catheter positions as described without pneumothorax. New area of left base infiltrate.   Electronically Signed   By: Lowella Grip M.D.   On: 10/31/2013 11:17   Portable Chest Xray  10/31/2013   CLINICAL DATA:  Hypoxia  EXAM: PORTABLE CHEST - 1 VIEW  COMPARISON:  Study obtained earlier in the day  FINDINGS: Endotracheal tube tip is now 3.9 cm above the carina. Nasogastric tube tip and side port are in the stomach. No pneumothorax. There is mild subsegmental atelectasis in the left mid lung and right base regions. Elsewhere lungs are clear. Heart is upper normal in size with normal pulmonary vascularity.  IMPRESSION: Tube positions as described. No pneumothorax. Areas of mild atelectatic change bilaterally.   Electronically Signed   By: Lowella Grip M.D.   On:  10/31/2013 08:56   Dg Chest Port 1 View  10/31/2013   CLINICAL DATA:  Hypoxia  EXAM: PORTABLE CHEST - 1 VIEW  COMPARISON:  Study obtained earlier in the day  FINDINGS: The endotracheal tube tip is at the level of the carina. No pneumothorax. There is patchy atelectatic change in the left mid lung and right base. Lungs are otherwise clear. Heart is mildly enlarged with normal pulmonary vascularity.  IMPRESSION: Endotracheal tube tip is at the carina. Advise withdrawing endotracheal tube approximately 3 cm. There is patchy atelectasis in the right base in left mid lung. No pneumothorax.   Electronically Signed   By: Lowella Grip M.D.   On: 10/31/2013 08:37   Dg Chest Portable 1 View  10/31/2013   CLINICAL DATA:  Respiratory distress. Shortness of breath and chest pain. History of smoking.  EXAM: PORTABLE CHEST - 1 VIEW  COMPARISON:  Chest radiograph performed 10/26/2013  FINDINGS: The lungs are mildly hypoexpanded. Vascular crowding and vascular congestion are seen. Mildly increased interstitial markings may reflect mild pulmonary edema. No pleural effusion or pneumothorax is seen.  The cardiomediastinal silhouette is mildly enlarged. No acute osseous abnormalities are identified.  IMPRESSION: Lungs mildly hypoexpanded. Vascular congestion and mild cardiomegaly noted. Mildly increased interstitial markings may reflect mild pulmonary edema.   Electronically Signed   By: Garald Balding M.D.   On: 10/31/2013 06:19   Dg Abd Portable 1v  10/31/2013   CLINICAL DATA:  Evaluate orogastric tube placement.  EXAM: PORTABLE ABDOMEN - 1 VIEW  COMPARISON:  No priors.  FINDINGS: Orogastric tube is present in the stomach with tip in the antral pre-pyloric region of the stomach. Visualized bowel gas pattern is nonobstructive.  IMPRESSION: Tip of orogastric tube is in the antral pre-pyloric region of the stomach.   Electronically Signed   By: Vinnie Langton M.D.   On: 10/31/2013 11:16    ASSESSMENT /  PLAN:  PULMONARY  A:  Acute Hypoxic respiratory failure in setting of diffuse pulmonary infiltrates. Evolving ARDS  Top on diff dx include: HCAP vs viral pneumonitis  H/O asthma: does not appear to be in bronchospasm. Wonder if she has true airflow limitations or VCD  Hypercarbia P:  Wean ventilator F/u SBT F/u ABG ARDS protocol  PAD protocol  See ID section  See neuro re: RASS goal  Scheduled BDs/ICS  Will hold off on steroids for now   CARDIOVASCULAR  A:  SIRS/sepsis  HTN  P:  Ck cvp: goal 8-12  MAP goal >65  Tele  F/u lactic acid   RENAL  A:  Hypokalemia  P:  Replace K  F/u chem  Renal dose meds  Avoid hypotension   GASTROINTESTINAL  A:  Obesity  P:  Start early nutrition  PPI for SUP   HEMATOLOGIC  A:  Leukocytosis  P:  Trend CBC   INFECTIOUS  A:  HCAP vs viral PNA  PCT neg x2 P:  See above (flow sheet)  F/u sputum culture and resp viral panel   ENDOCRINE  A:  Hypothyroidism  P:  Cont synthroid  Trend glucose/ SSI ordered   NEUROLOGIC  A:  Anxiety/ pain  High risk for delirium  P:  RASS goal: -1  PAS protocol  Supportive care   TODAY'S SUMMARY: Patient tolerating weaning of sedation. Plan for SBT this AM. Will need f/u CXR and lab work. Continues to need ICU care at this time.  I have personally obtained a history, examined the patient, evaluated laboratory and imaging results, formulated the assessment and plan and placed orders. CRITICAL CARE: The patient is critically ill with multiple organ systems failure and requires high complexity decision making for assessment and support, frequent evaluation and titration of therapies, application of advanced monitoring technologies and extensive interpretation of multiple databases. Critical Care Time devoted to patient care services described in this note is ___ minutes.    Pulmonary and [place] Pager: [phone]  11/01/2013, 9:34 AM

## 2013-11-01 NOTE — Progress Notes (Signed)
ANTIBIOTIC CONSULT NOTE - FOLLOW UP  Pharmacy Consult for Vancomycin, Zosyn, Levaquin Indication: pneumonia  Allergies  Allergen Reactions  . Estrogens Other (See Comments)    Stroke symptoms  . Morphine And Related Other (See Comments)    Sensitivity and does not alleviate pain  . Tamiflu Cough  . Wellbutrin [Bupropion] Other (See Comments)    Caused TIA combined with estrogen    Patient Measurements: Height: 5\' 5"  (165.1 cm) Weight: 238 lb 15.7 oz (108.4 kg) IBW/kg (Calculated) : 57  Vital Signs: Temp: 98.7 F (37.1 C) (06/17 1200) Temp src: Axillary (06/17 1200) BP: 117/67 mmHg (06/17 1337) Pulse Rate: 69 (06/17 1400) Intake/Output from previous day: 06/16 0701 - 06/17 0700 In: 3920.9 [I.V.:2920.9; IV Piggyback:1000] Out: 2140 [Urine:2140]  Labs:  Recent Labs  10/31/13 0615 10/31/13 0627 10/31/13 1100 11/01/13 1132  WBC 16.8*  --  13.2* 13.0*  HGB 14.7 15.6* 13.4 12.4  PLT 337  --  307 188  CREATININE  --  0.80 0.82 0.84   Estimated Creatinine Clearance: 109.1 ml/min (by C-G formula based on Cr of 0.84). No results found for this basename: VANCOTROUGH, VANCOPEAK, VANCORANDOM, GENTTROUGH, GENTPEAK, GENTRANDOM, TOBRATROUGH, TOBRAPEAK, TOBRARND, AMIKACINPEAK, AMIKACINTROU, AMIKACIN,  in the last 72 hours   Microbiology: Recent Results (from the past 720 hour(s))  CULTURE, BLOOD (ROUTINE X 2)     Status: None   Collection Time    10/26/13  4:07 PM      Result Value Ref Range Status   Specimen Description BLOOD LEFT ANTECUBITAL   Final   Special Requests BOTTLES DRAWN AEROBIC AND ANAEROBIC 5ML   Final   Culture  Setup Time     Final   Value: 10/26/2013 22:01     Performed at [facility]   Culture     Final   Value: NO GROWTH 5 DAYS     Performed at [facility]   Report Status 11/01/2013 FINAL   Final  CULTURE, BLOOD (ROUTINE X 2)     Status: None   Collection Time    10/26/13  4:08 PM      Result Value Ref Range Status   Specimen  Description BLOOD BLOOD LEFT FOREARM   Final   Special Requests BOTTLES DRAWN AEROBIC AND ANAEROBIC 5ML   Final   Culture  Setup Time     Final   Value: 10/26/2013 22:01     Performed at [facility]   Culture     Final   Value: NO GROWTH 5 DAYS     Performed at [facility]   Report Status 11/01/2013 FINAL   Final  CULTURE, RESPIRATORY (NON-EXPECTORATED)     Status: None   Collection Time    10/27/13  8:57 AM      Result Value Ref Range Status   Specimen Description TRACHEAL ASPIRATE   Final   Special Requests NONE   Final   Gram Stain     Final   Value: RARE WBC PRESENT, PREDOMINANTLY PMN     RARE SQUAMOUS EPITHELIAL CELLS PRESENT     FEW GRAM NEGATIVE RODS     FEW GRAM POSITIVE COCCI     IN PAIRS   Culture     Final   Value: Non-Pathogenic Oropharyngeal-type [place] Isolated.     Performed at [facility]   Report Status 10/29/2013 FINAL   Final  CULTURE, BLOOD (ROUTINE X 2)     Status: None   Collection Time  10/31/13  7:09 AM      Result Value Ref Range Status   Specimen Description BLOOD LEFT HAND   Final   Special Requests BOTTLES DRAWN AEROBIC AND ANAEROBIC [facility] EACH   Final   Culture  Setup Time     Final   Value: 10/31/2013 11:08     Performed at [facility]   Culture     Final   Value:        BLOOD CULTURE RECEIVED NO GROWTH TO DATE CULTURE WILL BE HELD FOR 5 DAYS BEFORE ISSUING A FINAL NEGATIVE REPORT     Performed at [facility]   Report Status PENDING   Incomplete  CULTURE, BLOOD (ROUTINE X 2)     Status: None   Collection Time    10/31/13  7:09 AM      Result Value Ref Range Status   Specimen Description BLOOD LEFT ARM   Final   Special Requests BOTTLES DRAWN AEROBIC AND ANAEROBIC [facility] EACH   Final   Culture  Setup Time     Final   Value: 10/31/2013 11:08     Performed at [facility]   Culture     Final   Value:        BLOOD CULTURE RECEIVED NO GROWTH TO DATE CULTURE WILL BE HELD FOR 5 DAYS BEFORE ISSUING  A FINAL NEGATIVE REPORT     Performed at [facility]   Report Status PENDING   Incomplete  MRSA PCR SCREENING     Status: None   Collection Time    10/31/13  9:15 AM      Result Value Ref Range Status   MRSA by PCR NEGATIVE  NEGATIVE Final   Comment:            The GeneXpert MRSA Assay (FDA     approved for NASAL specimens     only), is one component of a     comprehensive MRSA colonization     surveillance program. It is not     intended to diagnose MRSA     infection nor to guide or     monitor treatment for     MRSA infections.  CULTURE, RESPIRATORY (NON-EXPECTORATED)     Status: None   Collection Time    10/31/13 11:13 AM      Result Value Ref Range Status   Specimen Description TRACHEAL ASPIRATE   Final   Special Requests NONE   Final   Gram Stain     Final   Value: FEW WBC PRESENT, PREDOMINANTLY PMN     NO SQUAMOUS EPITHELIAL CELLS SEEN     NO ORGANISMS SEEN     Performed at [facility]   Culture     Final   Value: Culture reincubated for better growth     Performed at [facility]   Report Status PENDING   Incomplete  CULTURE, RESPIRATORY (NON-EXPECTORATED)     Status: None   Collection Time    10/31/13  5:00 PM      Result Value Ref Range Status   Specimen Description TRACHEAL ASPIRATE   Final   Special Requests NONE   Final   Gram Stain     Final   Value: FEW WBC PRESENT,BOTH PMN AND MONONUCLEAR     NO SQUAMOUS EPITHELIAL CELLS SEEN     NO ORGANISMS SEEN     Performed at [facility]     Final  Value: NO GROWTH     Performed at [facility]   Report Status PENDING   Incomplete   Anti-infectives: 6/16 >> Vanc >> 6/16 >> Zosyn >> 6/16 >> Levaquin >>     Assessment: 55 yoF admitted 6/16 with acute respiratory failure.  She was [facility] with CAP and asthma exacerbation and was sent home with azithromycin, amoxicillin, and prednisone taper.  She now has worsening of pulmonary  infiltrates and required intubation.  Pharmacy is consulted to dose vanc, zosyn and levaquin.  6/17:  day #2 Vancomycin, Zosyn, and Levaquin  Tmax: 98.7  WBCs: Decreased, 13  Renal: SCr 0.84, CrCl > 100 ml/min  Cultures pending  Vancomycin trough level tonight.   Goal of Therapy:  Vancomycin trough level 15-20 mcg/ml  Plan:   Continue Zosyn 3.375g IV Q8H infused over 4hrs.  Continue Levaquin 750mg  IV q24h  Continue Vancomycin 1g IV q12h.  Measure Vanc trough at steady state.  Follow up renal fxn and culture results.  Gretta Arab PharmD, BCPS Pager [phone] 11/01/2013 3:16 PM

## 2013-11-01 NOTE — Progress Notes (Signed)
Passed SBT. Was fully awake. Writing. Indicated she felt she was ready to come off vent when questioned.  F/Vt acceptable. Extubated. Had loud upper airway noises s/p extubation w/ some increase in accessory muscle use. She was placed on NIPPV as she reported she does use CPAP at home. Also gave one nebulized racemic epi.  Plan  Continue BIPAP  F/u abg and MS in 1 hr  No sedation  If gas exchange worse or Clinically declines will need re-intubation   Marni Griffon ACNP-BC [place] Pager # [phone] OR # [phone] if no answer

## 2013-11-01 NOTE — Procedures (Signed)
Extubation Procedure Note  Patient Details:   Name: Denise Ray DOB: Aug 25, 1970 MRN: [id number]   Airway Documentation:  Airway 7 mm (Active)  Secured at (cm) 23 cm 11/01/2013 11:39 AM  Measured From Lips 11/01/2013 11:39 AM  [place] 11/01/2013  4:18 AM  Secured By [employer] 11/01/2013 11:39 AM  Tube Holder Repositioned Yes 11/01/2013 11:39 AM  Cuff Pressure (cm H2O) 22 cm H2O 10/31/2013  7:47 PM  Site Condition Dry 10/31/2013  9:15 AM    Evaluation  O2 sats: 61% Complications: Patient developed some respiratory distress and snoring respirations post extubation. Marni Griffon, NP was at bedside and ordered patient to be placed on BIPAP and to receive a racemic epinephrine breathing treatment. Patient is currently on BIPAP and is tolerating it well.  Patient did not tolerate procedure well initially until Bipap was initiated but is now maintaining well.   Suctioning: Airway   RT Note: Patient had been weaning with no issues all day. Patient was awake and following commands prior to extubation. She also had a positive leak test. Respiratory distress did occur post extubation which was overcome with BIPAP and a racemic epinephrine nebulizer treatment. Patient is currently on Bipap and is tolerating it well. BBS- diminished with a faint inspiratory wheeze, Spo2-98%, RR-21. Patient is currently maintaining and RT will continue to closely monitor patient.   Baird Lyons 11/01/2013, 2:00 PM

## 2013-11-01 NOTE — Progress Notes (Signed)
Nsg note: Fentanyl 125 ml wasted out of IV bag from continuous sedation. Versed 34ml wasted out of IV bag from continuous sedation. Both wastes were witnessed by [employer] RN.

## 2013-11-01 NOTE — Progress Notes (Signed)
STAFF NOTE: I, Dr Ann Lions have personally reviewed patient's available data, including medical history, events of note, physical examination and test results as part of my evaluation. I have discussed with resident/NP and other care providers such as pharmacist, RN and RRT.  In addition,  I personally evaluated patient and elicited key findings of Acute Hypoxemic Respiratory Failure due to ALI with bilateral LL consolidation. She has rapidly improved but still critical. She is off sedation gtt but RASS -2. Will go prn with sedation and aim to extubate next day or so. Lasix if needed./ No family at bedside.  Rest per NP/medical resident whose note is outlined above and that I agree with  The patient is critically ill with multiple organ systems failure and requires high complexity decision making for assessment and support, frequent evaluation and titration of therapies, application of advanced monitoring technologies and extensive interpretation of multiple databases.   Critical Care Time devoted to patient care services described in this note is  35  Minutes.  Dr. Brand Males, M.D., [facility].C.P Pulmonary and Critical Care Medicine Staff Physician [place] [facility] Pager: [phone], If no answer or between  15:00h - 7:00h: call [phone]  11/01/2013 11:33 AM

## 2013-11-01 NOTE — Progress Notes (Addendum)
PHARMACY BRIEF NOTE - Drug Level Result  The current Vancomycin dose is 1000 mg every 12 hours.  The trough level drawn prior to the 8 pm dose tonight is reported as 11.5.  This level is below the therapeutic range, 15-20 mcg/ml.  Plan:  Increase the Vancomycin dose to 1250 mg IV every 12 hours.  The first dose of the new regimen will be given early to provide a loading dose effect.  [place] 11/01/2013 8:15 PM

## 2013-11-01 NOTE — Progress Notes (Addendum)
Looks better. Awake, follows commands. Denies dyspnea or chest pain. Thinks the BIPAP is helping. Gas exchange noted and about the same as when ventilated (per below) but symptomatically looks better.   ABG    Component Value Date/Time   PHART 7.368 11/01/2013 1430   PCO2ART 52.9* 11/01/2013 1430   PO2ART 145.0* 11/01/2013 1430   HCO3 29.7* 11/01/2013 1430   TCO2 26.9 11/01/2013 1430   O2SAT 98.9 11/01/2013 1430   Plan Cont BIPAP/supportive care  F/u ABG at 1800 All other interventions as per our plan earlier today.   Marni Griffon ACNP-BC [place] Pager # [phone] OR # [phone] if no answer

## 2013-11-02 ENCOUNTER — Inpatient Hospital Stay (HOSPITAL_COMMUNITY): Payer: 59

## 2013-11-02 DIAGNOSIS — S27309A Unspecified injury of lung, unspecified, initial encounter: Secondary | ICD-10-CM | POA: Diagnosis present

## 2013-11-02 DIAGNOSIS — B9789 Other viral agents as the cause of diseases classified elsewhere: Secondary | ICD-10-CM

## 2013-11-02 DIAGNOSIS — B348 Other viral infections of unspecified site: Secondary | ICD-10-CM | POA: Diagnosis not present

## 2013-11-02 DIAGNOSIS — J45901 Unspecified asthma with (acute) exacerbation: Secondary | ICD-10-CM

## 2013-11-02 DIAGNOSIS — J189 Pneumonia, unspecified organism: Secondary | ICD-10-CM

## 2013-11-02 LAB — CBC
HCT: 35.7 % — ABNORMAL LOW (ref 36.0–46.0)
Hemoglobin: 11.4 g/dL — ABNORMAL LOW (ref 12.0–15.0)
MCH: 29.4 pg (ref 26.0–34.0)
MCHC: 31.9 g/dL (ref 30.0–36.0)
MCV: 92 fL (ref 78.0–100.0)
Platelets: 240 10*3/uL (ref 150–400)
RBC: 3.88 MIL/uL (ref 3.87–5.11)
RDW: 14.2 % (ref 11.5–15.5)
WBC: 12.2 10*3/uL — ABNORMAL HIGH (ref 4.0–10.5)

## 2013-11-02 LAB — HEPATIC FUNCTION PANEL
ALT: 12 U/L (ref 0–35)
AST: 13 U/L (ref 0–37)
Albumin: 3.1 g/dL — ABNORMAL LOW (ref 3.5–5.2)
Alkaline Phosphatase: 51 U/L (ref 39–117)
Bilirubin, Direct: <0.2 mg/dL (ref 0.0–0.3)
TOTAL PROTEIN: 6.9 g/dL (ref 6.0–8.3)
Total Bilirubin: 0.3 mg/dL (ref 0.3–1.2)

## 2013-11-02 LAB — AMYLASE: AMYLASE: 31 U/L (ref 0–105)

## 2013-11-02 LAB — BLOOD GAS, ARTERIAL
ACID-BASE EXCESS: 2 mmol/L (ref 0.0–2.0)
Bicarbonate: 28.4 mEq/L — ABNORMAL HIGH (ref 20.0–24.0)
DRAWN BY: 31814
FIO2: 0.44 %
O2 Saturation: 98.9 %
PCO2 ART: 55.4 mmHg — AB (ref 35.0–45.0)
PH ART: 7.329 — AB (ref 7.350–7.450)
Patient temperature: 98.5
TCO2: 26.2 mmol/L (ref 0–100)
pO2, Arterial: 144 mmHg — ABNORMAL HIGH (ref 80.0–100.0)

## 2013-11-02 LAB — GLUCOSE, CAPILLARY
GLUCOSE-CAPILLARY: 95 mg/dL (ref 70–99)
Glucose-Capillary: 92 mg/dL (ref 70–99)

## 2013-11-02 LAB — LIPASE, BLOOD: Lipase: 12 U/L (ref 11–59)

## 2013-11-02 LAB — BASIC METABOLIC PANEL
BUN: 16 mg/dL (ref 6–23)
CO2: 28 mEq/L (ref 19–32)
Calcium: 8.4 mg/dL (ref 8.4–10.5)
Chloride: 98 mEq/L (ref 96–112)
Creatinine, Ser: 0.87 mg/dL (ref 0.50–1.10)
GFR calc Af Amer: >90 mL/min (ref >90)
GFR calc non Af Amer: 82 mL/min — ABNORMAL LOW (ref >90)
Glucose, Bld: 102 mg/dL — ABNORMAL HIGH (ref 70–99)
Potassium: 3.7 mEq/L (ref 3.7–5.3)
SODIUM: 138 meq/L (ref 137–147)

## 2013-11-02 LAB — CULTURE, RESPIRATORY W GRAM STAIN

## 2013-11-02 LAB — MAGNESIUM: MAGNESIUM: 2.2 mg/dL (ref 1.5–2.5)

## 2013-11-02 LAB — CULTURE, RESPIRATORY

## 2013-11-02 LAB — SJOGRENS SYNDROME-A EXTRACTABLE NUCLEAR ANTIBODY: SSA (Ro) (ENA) Antibody, IgG: <1

## 2013-11-02 LAB — SJOGRENS SYNDROME-B EXTRACTABLE NUCLEAR ANTIBODY: SSB (La) (ENA) Antibody, IgG: <1

## 2013-11-02 LAB — PHOSPHORUS: Phosphorus: 4.4 mg/dL (ref 2.3–4.6)

## 2013-11-02 LAB — PROCALCITONIN

## 2013-11-02 LAB — CYCLIC CITRUL PEPTIDE ANTIBODY, IGG: Cyclic Citrullin Peptide Ab: <2 U/mL (ref 0.0–5.0)

## 2013-11-02 IMAGING — CR DG CHEST 1V PORT
1 series · 1 of 1 positions shown · non-contrast
Comparison: Portable chest radiograph [DATE]

CLINICAL DATA: pneumonia vs pulm edema, mechanical ventilation

EXAM:
PORTABLE CHEST - 1 VIEW

[AP]
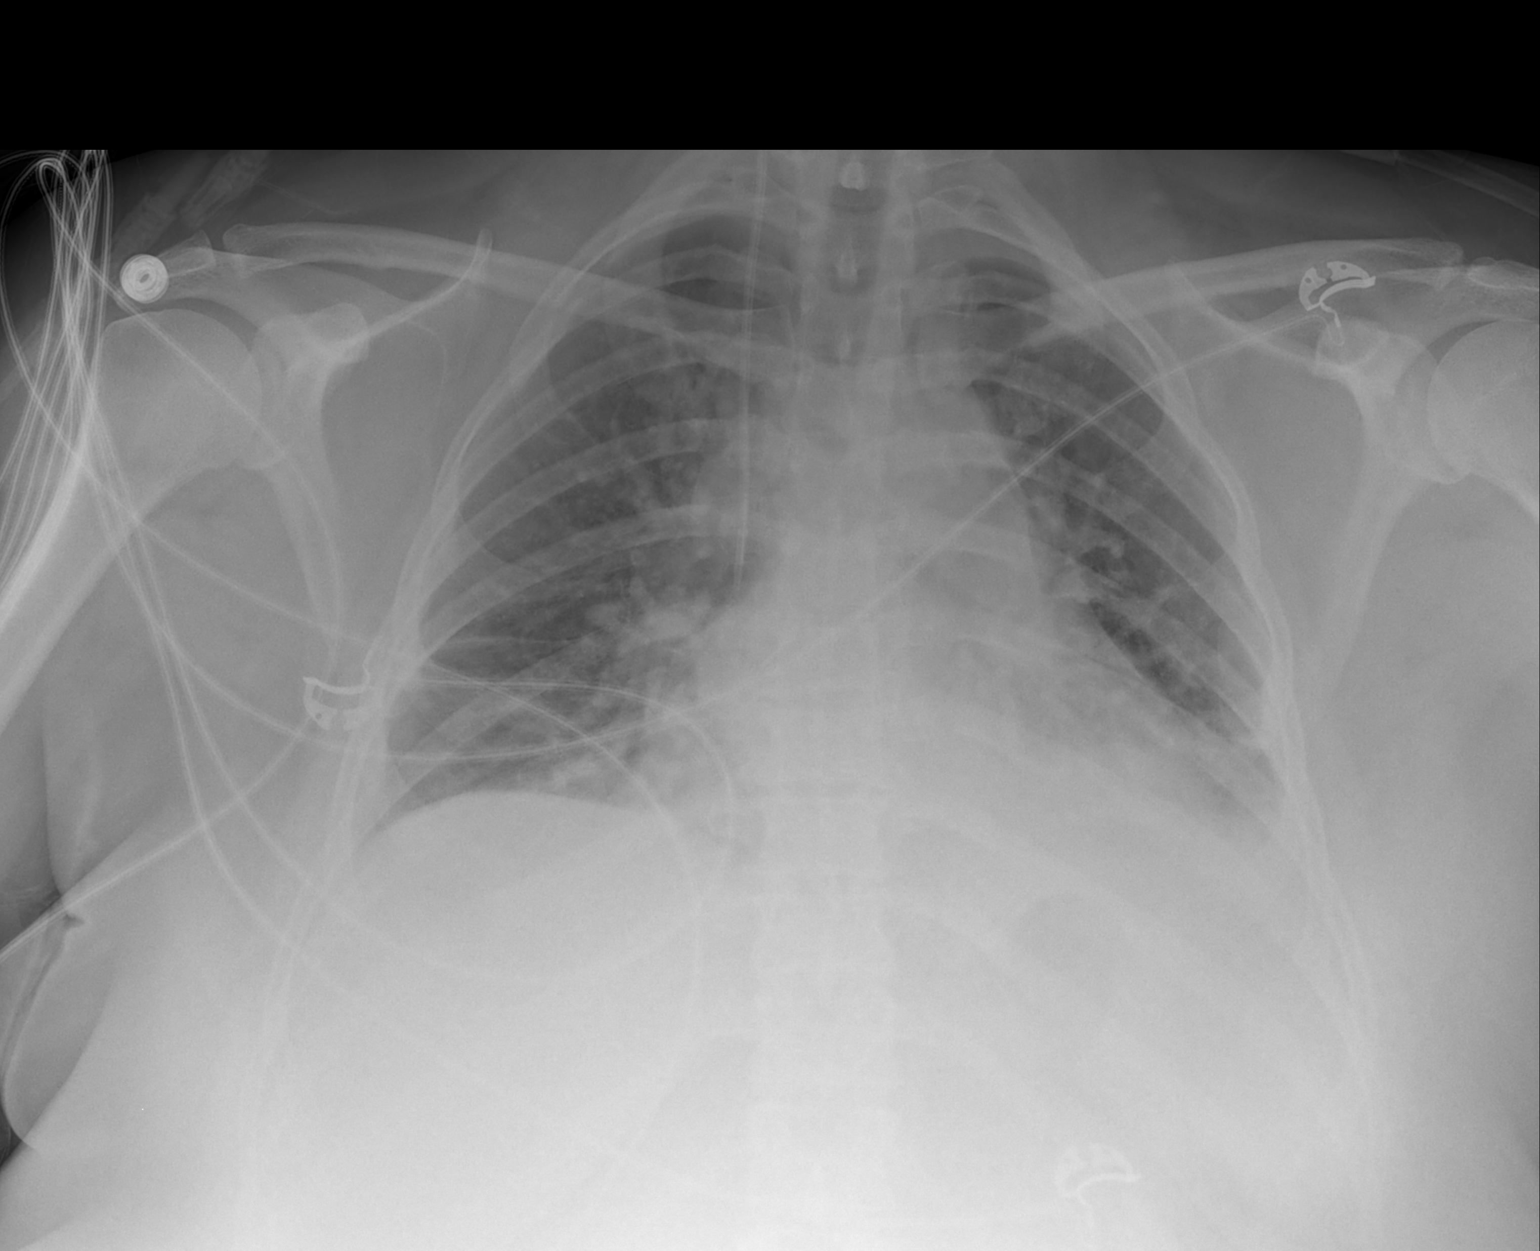

[1 of 1 positions shown; findings below may reference images not displayed]

FINDINGS: Low lung volumes. Cardiac silhouette is enlarged. The patient has
been extubated. Enteric tube is been removed. Right internal jugular
catheter tip superior vena cava. Persistent bibasilar atelectasis.
No acute osseous abnormalities.
IMPRESSION: Persistent bibasilar atelectasis. Slightly accentuated by the low
lung volumes.

Patient is status post extubation and enteric tube removal. Right IJ
catheter stable.

## 2013-11-02 MED ORDER — METOCLOPRAMIDE HCL 5 MG/ML IJ SOLN
10.0000 mg | Freq: Four times a day (QID) | INTRAMUSCULAR | Status: DC | PRN
  Administered 2013-11-02 (×2): 10 mg via INTRAVENOUS
  Filled 2013-11-02 (×2): qty 2

## 2013-11-02 MED ORDER — DEXTROSE-NACL 5-0.45 % IV SOLN
INTRAVENOUS | Status: DC
  Administered 2013-11-02 – 2013-11-03 (×3): via INTRAVENOUS

## 2013-11-02 MED ORDER — ALPRAZOLAM 0.25 MG PO TABS: 0.2500 mg | ORAL_TABLET | Freq: Three times a day (TID) | ORAL | Status: DC | PRN

## 2013-11-02 MED ORDER — PANTOPRAZOLE SODIUM 40 MG PO TBEC
40.0000 mg | DELAYED_RELEASE_TABLET | Freq: Every day | ORAL | Status: DC
  Filled 2013-11-02: qty 1

## 2013-11-02 MED ORDER — POTASSIUM CHLORIDE 10 MEQ/50ML IV SOLN
10.0000 meq | INTRAVENOUS | Status: AC
  Administered 2013-11-02 (×2): 10 meq via INTRAVENOUS
  Filled 2013-11-02 (×2): qty 50

## 2013-11-02 MED ORDER — PROMETHAZINE HCL 25 MG/ML IJ SOLN
12.5000 mg | Freq: Four times a day (QID) | INTRAMUSCULAR | Status: DC | PRN
  Administered 2013-11-02 – 2013-11-03 (×4): 12.5 mg via INTRAVENOUS
  Filled 2013-11-02 (×4): qty 1

## 2013-11-02 MED ORDER — METHYLPREDNISOLONE SODIUM SUCC 125 MG IJ SOLR
60.0000 mg | Freq: Three times a day (TID) | INTRAMUSCULAR | Status: DC
  Administered 2013-11-02 – 2013-11-03 (×3): 60 mg via INTRAVENOUS
  Filled 2013-11-02 (×3): qty 2

## 2013-11-02 NOTE — Progress Notes (Signed)
PULMONARY / CRITICAL CARE MEDICINE   Name: Denise Ray MRN: [id number] DOB: 03-25-1971    ADMISSION DATE:  10/31/2013 CONSULTATION DATE:  6/16  PRIMARY SERVICE: PCCM  CHIEF COMPLAINT:  Acute respiratory failure  BRIEF PATIENT DESCRIPTION: 43 yo female just d/c from [place] on 6/12 w/ dx of CAP and asthmatic exacerbation (sent home on azith/amoxicillin and pred taper). Presented back to [facility] 6/16 with worsening pulmonary infiltrates and acute respiratory failure.   has a past medical history of History of abnormal cervical Pap smear; Hypothyroidism; Asthma; Anxiety; Depression; TIA (transient ischemic attack); GERD (gastroesophageal reflux disease); PTSD (post-traumatic stress disorder); History of attempted suicide; and Cervical cancer.   has past surgical history that includes CONIZATION OF CERVIX (2000); Hysteroscopy w/D&C (4/09); CHEST WALL/LUNG MASS RESECTION (7/03); y catheter; Robotic assisted total hysterectomy (N/A, 10/11/2012); Salpingoophorectomy (Bilateral, 10/11/2012); Dilation and curettage of uterus; Hysteroscopy (08/29/2003); Abdominal hysterectomy; and Video assisted thoracoscopy (vats)/thorocotomy.   reports that she has been smoking Cigarettes.  She has been smoking about 1.00 pack per day. She has never used smokeless tobacco.   SIGNIFICANT EVENTS / STUDIES:  6/16 admission 6/17 improving respiratory status   LINES / TUBES: ETT 6/16 >>> 6/17 Left radial arterial line 6/16 >>> 6/17 Right IJ central line 6/16 >>>  CULTURES: resp culture 6/16>>> few WBC, predominantly PMN >>> BCX2 6/16>>>  resp viral panel 6/16>>> Rhinovirus   ANTIBIOTICS: vanc 6/16>>>  Zosyn 6/16>>>  levaquin 6/16>>>  SUBJECTIVE AND OVERNIGHT: Nausea with BiPAP overnight, oxygenating well on 4 LNC, continues to be hypercarbic  VITAL SIGNS: Temp:  [97.9 F (36.6 C)-98.7 F (37.1 C)] 98.5 F (36.9 C) (06/18 0400) Pulse Rate:  [55-114] 65 (06/18 0600) Resp:  [6-30] 16 (06/18 0600) BP:  (115-138)/(43-70) 115/43 mmHg (06/18 0554) SpO2:  [93 %-99 %] 97 % (06/18 0600) FiO2 (%):  [40 %-60 %] 60 % (06/18 0020) Weight:  [111.2 kg (245 lb 2.4 oz)] 111.2 kg (245 lb 2.4 oz) (06/18 0400) 4L [place]  INTAKE / OUTPUT: Intake/Output     06/17 0701 - 06/18 0700 06/18 0701 - 06/19 0700   I.V. (mL/kg) 2301.5 (20.7)    IV Piggyback 1050    Total Intake(mL/kg) 3351.5 (30.1)    Urine (mL/kg/hr) 1120 (0.4)    Emesis/NG output 100 (0)    Total Output 1220     Net +2131.5          Emesis Occurrence 1 x      PHYSICAL EXAMINATION: General: chronically ill appearing female Neuro: A/o x4, follows commands, MAEs HEENT: 4L [place] Cardiovascular: regular, no murmurs Lungs: b/l expiratory wheeze Abdomen: obese, soft, non-distended, nontender Musculoskeletal: no deformity Skin: no edema, no cyanosis  LABS:  CBC  Recent Labs Lab 10/31/13 1100 11/01/13 1132 11/02/13 0350  WBC 13.2* 13.0* 12.2*  HGB 13.4 12.4 11.4*  HCT 40.1 38.9 35.7*  PLT 307 188 240   Coag's No results found for this basename: APTT, INR,  in the last 168 hours BMET  Recent Labs Lab 10/26/13 1409 10/31/13 0627 10/31/13 1100 11/01/13 1132 11/02/13 0350  NA 140 143  --  140 138  K 3.2* 3.3*  --  3.8 3.7  CL 102 102  --  99 98  CO2 20  --   --  31 28  BUN 5* 13  --  16 16  CREATININE 0.64 0.80 0.82 0.84 0.87  GLUCOSE 116* 102*  --  88 102*   Electrolytes  Recent Labs Lab 10/26/13 1409 11/01/13 1132 11/02/13  0350  CALCIUM 9.6 8.4 8.4  MG  --   --  2.2  PHOS  --   --  4.4   Sepsis Markers  Recent Labs Lab 10/31/13 0625 10/31/13 1100 11/01/13 0425 11/01/13 1150 11/02/13 0350  LATICACIDVEN 2.28*  --   --  1.3  --   PROCALCITON  --  <0.10 <0.10  --  <0.10   ABG  Recent Labs Lab 11/01/13 1430 11/01/13 1736 11/02/13 0457  PHART 7.368 7.341* 7.329*  PCO2ART 52.9* 55.3* 55.4*  PO2ART 145.0* 81.1 144.0*   Liver Enzymes No results found for this basename: AST, ALT, ALKPHOS, BILITOT,  ALBUMIN,  in the last 168 hours Cardiac Enzymes  Recent Labs Lab 10/26/13 1409 10/31/13 0615  TROPONINI <0.30  --   PROBNP 95.1 80.6   Glucose  Recent Labs Lab 11/01/13 0735 11/01/13 1138 11/01/13 1614 11/01/13 1930 11/01/13 2336 11/02/13 0357  GLUCAP 85 87 94 96 94 92    Imaging Ct Angio Chest Pe W/cm &/or Wo Cm  10/31/2013   CLINICAL DATA:  ARDS. Evaluate for a pulmonary embolism. History of cervical cancer.  EXAM: CT ANGIOGRAPHY CHEST WITH CONTRAST  TECHNIQUE: Multidetector CT imaging of the chest was performed using the standard protocol during bolus administration of intravenous contrast. Multiplanar CT image reconstructions and MIPs were obtained to evaluate the vascular anatomy.  CONTRAST:  168mL OMNIPAQUE IOHEXOL 350 MG/ML SOLN  COMPARISON:  No priors.  FINDINGS: Mediastinum: No filling defects within the pulmonary arterial tree to suggest underlying pulmonary embolism. Heart size is mildly enlarged. There is no significant pericardial fluid, thickening or pericardial calcification. No pathologically enlarged mediastinal or hilar lymph nodes. Esophagus is unremarkable in appearance. Nasogastric tube extends into the stomach (tipped of tube is below the lower margin of the images). Endotracheal tube in position with tip at the level of the aortic arch. Right IJ central venous catheter with tip terminating at the superior cavoatrial junction.  Lungs/Pleura: There is a combination of volume loss and airspace consolidation in the dependent portions of the lower lobes of the lungs bilaterally, the appearance of which is suggestive of recent massive aspiration. Lungs are otherwise relatively clear, with exception of some atelectasis and consolidation in the inferior segment of the lingula. No pleural effusions.  Upper Abdomen: Unremarkable.  Musculoskeletal: There are no aggressive appearing lytic or blastic lesions noted in the visualized portions of the skeleton.  Review of the MIP  images confirms the above findings.  IMPRESSION: 1. No evidence of pulmonary embolism. 2. The appearance of the lungs is strongly suggestive of a recent massive aspiration event, as discussed above.   Electronically Signed   By: Vinnie Langton M.D.   On: 10/31/2013 14:46   Dg Chest Port 1 View  11/01/2013   CLINICAL DATA:  Endotracheal tube  EXAM: PORTABLE CHEST - 1 VIEW  COMPARISON:  Chest radiograph chest CT 10/31/2013  FINDINGS: Endotracheal tube in position 3 cm from carina. NG tube extends to the stomach. Right central venous line with tip in the distal SVC. Normal cardiac silhouette. There is bibasilar atelectasis similar prior. Central venous congestion noted. No pneumothorax.  IMPRESSION: 1. Endotracheal tube in good position. 2. Bibasilar atelectasis. 3. Central venous congestion.   Electronically Signed   By: Suzy Bouchard M.D.   On: 11/01/2013 10:57   Dg Chest Port 1 View  10/31/2013   CLINICAL DATA:  Central catheter placement  EXAM: PORTABLE CHEST - 1 VIEW  COMPARISON:  Study obtained earlier in the  day  FINDINGS: Central catheter tip is in the superior vena cava near the cavoatrial junction. Endotracheal tube tip is 3.6 cm above the carina. Nasogastric tube tip and side port are in the stomach. There is a small area of consolidation in the left base, new. Lungs elsewhere are clear. Heart is mildly enlarged with normal pulmonary vascularity.  IMPRESSION: The tube and catheter positions as described without pneumothorax. New area of left base infiltrate.   Electronically Signed   By: Lowella Grip M.D.   On: 10/31/2013 11:17   Portable Chest Xray  10/31/2013   CLINICAL DATA:  Hypoxia  EXAM: PORTABLE CHEST - 1 VIEW  COMPARISON:  Study obtained earlier in the day  FINDINGS: Endotracheal tube tip is now 3.9 cm above the carina. Nasogastric tube tip and side port are in the stomach. No pneumothorax. There is mild subsegmental atelectasis in the left mid lung and right base regions.  Elsewhere lungs are clear. Heart is upper normal in size with normal pulmonary vascularity.  IMPRESSION: Tube positions as described. No pneumothorax. Areas of mild atelectatic change bilaterally.   Electronically Signed   By: Lowella Grip M.D.   On: 10/31/2013 08:56   Dg Chest Port 1 View  10/31/2013   CLINICAL DATA:  Hypoxia  EXAM: PORTABLE CHEST - 1 VIEW  COMPARISON:  Study obtained earlier in the day  FINDINGS: The endotracheal tube tip is at the level of the carina. No pneumothorax. There is patchy atelectatic change in the left mid lung and right base. Lungs are otherwise clear. Heart is mildly enlarged with normal pulmonary vascularity.  IMPRESSION: Endotracheal tube tip is at the carina. Advise withdrawing endotracheal tube approximately 3 cm. There is patchy atelectasis in the right base in left mid lung. No pneumothorax.   Electronically Signed   By: Lowella Grip M.D.   On: 10/31/2013 08:37   Dg Abd Portable 1v  10/31/2013   CLINICAL DATA:  Evaluate orogastric tube placement.  EXAM: PORTABLE ABDOMEN - 1 VIEW  COMPARISON:  No priors.  FINDINGS: Orogastric tube is present in the stomach with tip in the antral pre-pyloric region of the stomach. Visualized bowel gas pattern is nonobstructive.  IMPRESSION: Tip of orogastric tube is in the antral pre-pyloric region of the stomach.   Electronically Signed   By: Vinnie Langton M.D.   On: 10/31/2013 11:16    ASSESSMENT / PLAN:  PULMONARY  A:  Acute Hypoxic respiratory failure in setting of diffuse pulmonary infiltrates -  Top on diff dx include: HCAP vs viral pneumonitis. CXR 6/18 low volume +/- element of volume excess.  Rhinovirus  H/O asthma  Hypercarbia - stable P:   Lasix X 1 Encourage pulm hygiene  F/u Cxr in AM See ID section  Ct BDs/ICS  Start steroids (staff MD, see rationale below)  CARDIOVASCULAR  A:  SIRS/sepsis  HTN  P:  MAP goal >65  Tele   RENAL  A:  Hypokalemia - resolved Positive balance P:  F/u  chem  Lasix X 1    GASTROINTESTINAL  A:  Obesity  Nausea P:  Speech eval Restarted home PPI Start reglan IV PRN  HEMATOLOGIC  A:  Leukocytosis - improving P:  Lovenox CBC PRN  INFECTIOUS  A:  Rhinovirus pneumonitis plus possible HCAP superinfection  P:  Narrow to mono-rx w/ levaquin  See above (flow sheet)   ENDOCRINE  A:  Hypothyroidism  P:  Cont synthroid  D/c SSI Ck glucose on BMP  NEUROLOGIC  A:  Anxiety/ pain  P:  Restart xanax at lower dose given hypercarbia  Supportive care   TODAY'S SUMMARY: Patient feeling better this morning on 4L [place]. CXR worse this AM, will give Lasix and repeat CXR in AM. Will need ICU care for one more day, plan for tenative SDU status in AM.   Lalla Brothers NP student [place] NP  Pulmonary and [place] Pager: [phone]  11/02/2013, 7:22 AM    STAFF NOTE: I, Dr Ann Lions have personally reviewed patient's available data, including medical history, events of note, physical examination and test results as part of my evaluation. I have discussed with resident/NP and other care providers such as pharmacist, RN and RRT.  In addition,  I personally evaluated patient and elicited key findings of - acute resp failure with ALI due to rhinovirus. She is better post extubation but still hypercarbic and very deconditioned. Needed bipap. Given age 61, smoking hx, past dx of asthma, recent admit 10/26/13 for asthma, a virus c/w asthma exacerbation/ALI - she is high pre-test prob for asthma/copd. Will re-initiate IV steroids. Continue ICU stay .  Rest per NP/medical resident whose note is outlined above and that I agree with  The patient is critically ill with multiple organ systems failure and requires high complexity decision making for assessment and support, frequent evaluation and titration of therapies, application of advanced monitoring technologies and extensive interpretation of multiple  databases.   Critical Care Time devoted to patient care services described in this note is  35  Minutes.  Dr. Brand Males, M.D., [facility].C.P Pulmonary and Critical Care Medicine Staff Physician [place] [facility] Pager: [phone], If no answer or between  15:00h - 7:00h: call [phone]  11/02/2013 12:16 PM

## 2013-11-02 NOTE — Progress Notes (Signed)
Pt unable to tolerate BiPAP due to nausea/vomiting. Pt placed on BiPAP at 0020 and removed around 0150. Pt is currently on 6L nasal cannula SpO2 97%. RT will continue to monitor.

## 2013-11-02 NOTE — Progress Notes (Signed)
Pt. Wasn't placed on BIPAP at this time due to nausea. Will check back with pt. Later to see if nausea has subsided & if so will place pt. On BIPAP QHS.

## 2013-11-02 NOTE — Progress Notes (Signed)
PT remains off BiPAP due to nausea.

## 2013-11-02 NOTE — Progress Notes (Signed)
eLink Physician-Brief Progress Note Patient Name: Denise Ray DOB: [birth year] MRN: [id number]  Date of Service  11/02/2013   HPI/Events of Note   Phenergan ordered  eICU Interventions     Intervention Category Minor Interventions: Routine modifications to care plan (e.g. PRN medications for pain, fever)  BYRUM,ROBERT S. 11/02/2013, 5:19 AM

## 2013-11-02 NOTE — Progress Notes (Signed)
PT off BiPAP- remains nauseated. Does not appear to be in distress at this time. RN aware.

## 2013-11-02 NOTE — Progress Notes (Signed)
Pt. Was placed on BIPAP 14/7, back up rate: 15< FIO2: 60%. Pt. Is tolerating BIPAP well at this time without any complications. Pt. Was made aware to let RT or RN know if she develops nausea.

## 2013-11-02 NOTE — Progress Notes (Signed)
SLP Cancellation Note  Patient Details Name: Denise Ray MRN: [id number] DOB: April 26, 1971   Cancelled treatment:        Unable to complete BSE at this time. RN reports pt has been nauseous all day, and has not been able to keep anything down. Will hold evaluation until tomorrow, as pt tolerates. RN aware and in agreement.  Celia B. Quentin [facility], CCC-SLP [phone]  Denise Ray 11/02/2013, 2:07 PM

## 2013-11-02 NOTE — Progress Notes (Signed)
[facility] ADULT ICU REPLACEMENT PROTOCOL FOR AM LAB REPLACEMENT ONLY  The patient does apply for the [facility] Adult ICU Electrolyte Replacment Protocol based on the criteria listed below:   1. Is GFR >/= 40 ml/min? yes  Patient's GFR today is 82 2. Is urine output >/= 0.5 ml/kg/hr for the last 6 hours? yes Patient's UOP is 0.6 ml/kg/hr 3. Is BUN < 60 mg/dL? yes  Patient's BUN today is 16 4. Abnormal electrolyte(s):K3.7 5. Ordered repletion with: 35meq/IV 6. If a panic level lab has been reported, has the CCM MD in charge been notified? yes.   Physician:  Earl Many 11/02/2013 5:10 AM

## 2013-11-03 ENCOUNTER — Inpatient Hospital Stay (HOSPITAL_COMMUNITY): Payer: 59

## 2013-11-03 DIAGNOSIS — Z5189 Encounter for other specified aftercare: Secondary | ICD-10-CM

## 2013-11-03 DIAGNOSIS — S27309A Unspecified injury of lung, unspecified, initial encounter: Secondary | ICD-10-CM

## 2013-11-03 LAB — CULTURE, RESPIRATORY: CULTURE: NO GROWTH

## 2013-11-03 LAB — BASIC METABOLIC PANEL
BUN: 10 mg/dL (ref 6–23)
CO2: 30 mEq/L (ref 19–32)
Calcium: 9.1 mg/dL (ref 8.4–10.5)
Chloride: 98 mEq/L (ref 96–112)
Creatinine, Ser: 0.69 mg/dL (ref 0.50–1.10)
GFR calc non Af Amer: >90 mL/min (ref >90)
Glucose, Bld: 167 mg/dL — ABNORMAL HIGH (ref 70–99)
Potassium: 4.4 mEq/L (ref 3.7–5.3)
SODIUM: 137 meq/L (ref 137–147)

## 2013-11-03 LAB — PHOSPHORUS: PHOSPHORUS: 3.9 mg/dL (ref 2.3–4.6)

## 2013-11-03 LAB — MAGNESIUM: MAGNESIUM: 2.2 mg/dL (ref 1.5–2.5)

## 2013-11-03 IMAGING — CR DG CHEST 1V PORT
1 series · 1 of 1 positions shown · non-contrast
Comparison: [DATE]

CLINICAL DATA: Shortness of breath and chest pain

EXAM:
PORTABLE CHEST - 1 VIEW

[AP]
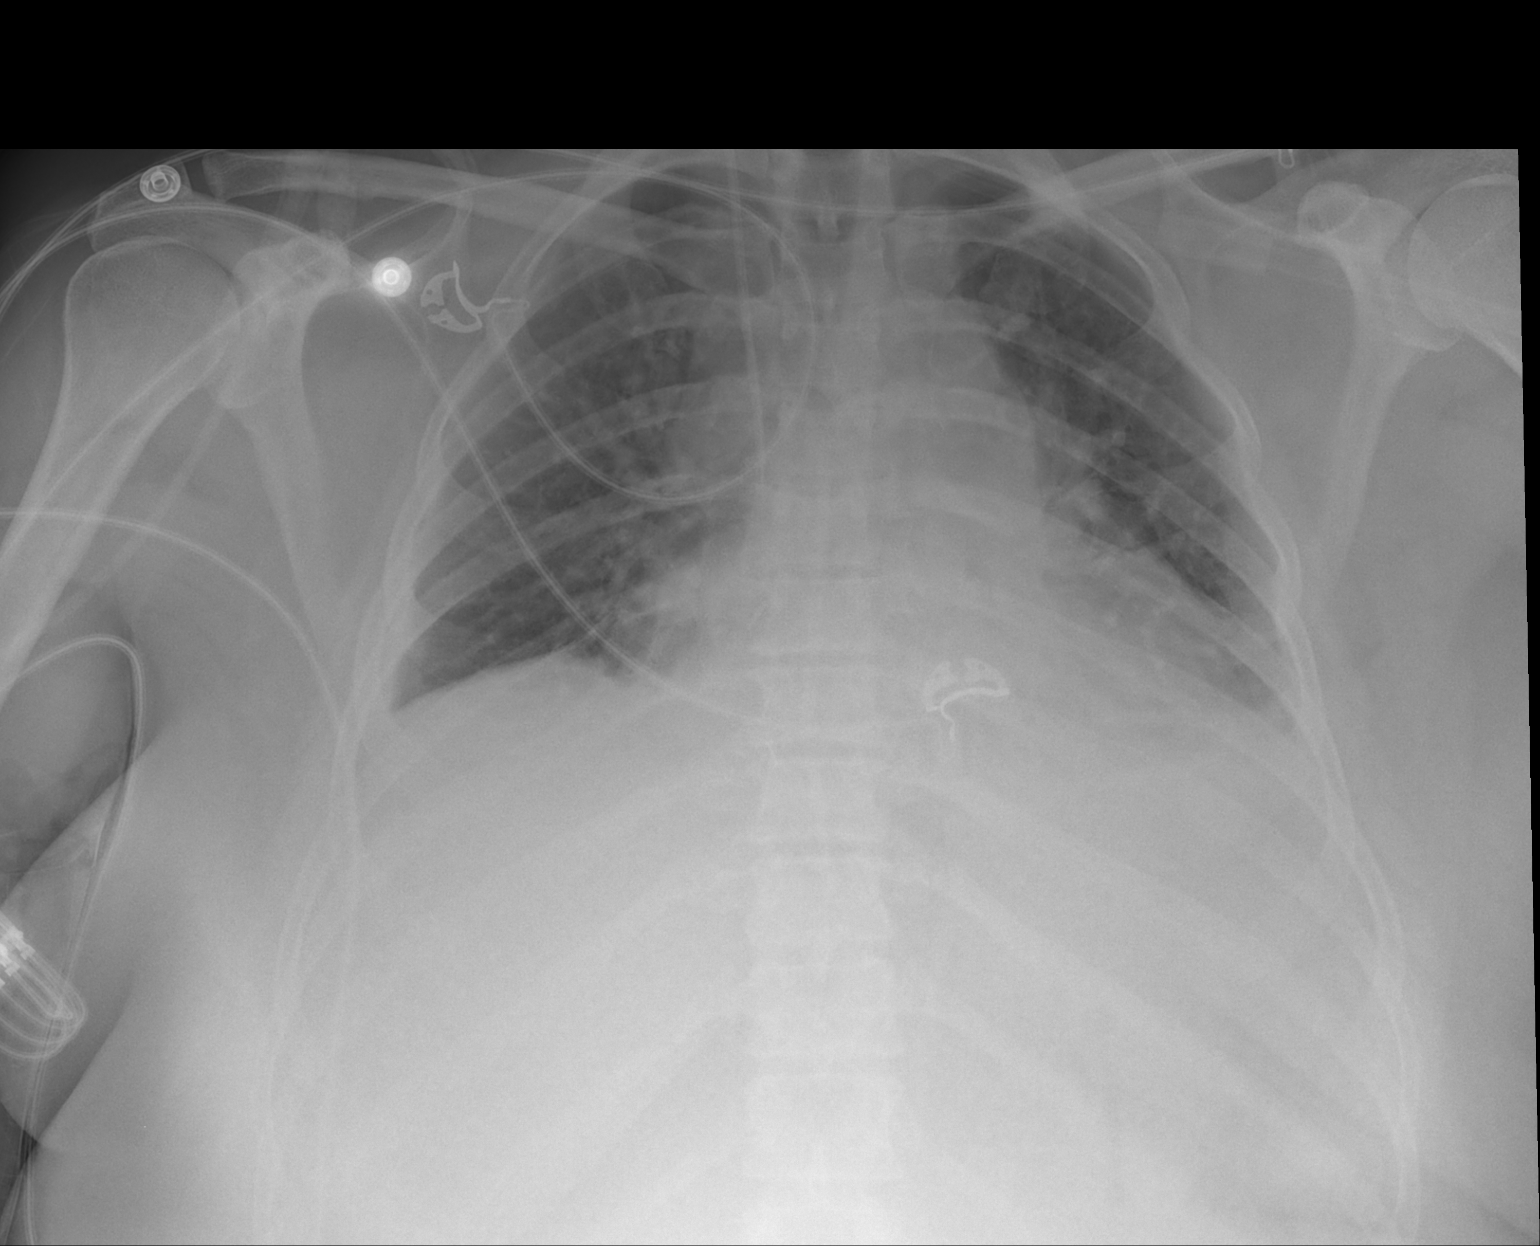

[1 of 1 positions shown; findings below may reference images not displayed]

FINDINGS: Central catheter tip is in the superior vena cava near the
cavoatrial junction. No pneumothorax. There is no edema or
consolidation. Heart is upper normal in size with normal pulmonary
vascularity. No adenopathy. No bone lesions.
IMPRESSION: Central catheter as described without pneumothorax. No edema or
consolidation.

## 2013-11-03 MED ORDER — LEVOTHYROXINE SODIUM 50 MCG PO TABS
50.0000 ug | ORAL_TABLET | Freq: Every day | ORAL | Status: DC
  Administered 2013-11-04 – 2013-11-05 (×2): 50 ug via ORAL
  Filled 2013-11-03 (×2): qty 1

## 2013-11-03 MED ORDER — HYDROCODONE-ACETAMINOPHEN 5-325 MG PO TABS
1.0000 | ORAL_TABLET | Freq: Three times a day (TID) | ORAL | Status: DC | PRN
  Administered 2013-11-03: 1 via ORAL
  Filled 2013-11-03: qty 1

## 2013-11-03 MED ORDER — PANTOPRAZOLE SODIUM 40 MG PO TBEC
40.0000 mg | DELAYED_RELEASE_TABLET | Freq: Two times a day (BID) | ORAL | Status: DC
  Administered 2013-11-03 – 2013-11-05 (×4): 40 mg via ORAL
  Filled 2013-11-03 (×4): qty 1

## 2013-11-03 MED ORDER — AMOXICILLIN-POT CLAVULANATE 875-125 MG PO TABS
1.0000 | ORAL_TABLET | Freq: Two times a day (BID) | ORAL | Status: DC
  Administered 2013-11-03 – 2013-11-05 (×5): 1 via ORAL
  Filled 2013-11-03 (×5): qty 1

## 2013-11-03 MED ORDER — IPRATROPIUM-ALBUTEROL 0.5-2.5 (3) MG/3ML IN SOLN
3.0000 mL | RESPIRATORY_TRACT | Status: DC
  Administered 2013-11-03 – 2013-11-04 (×6): 3 mL via RESPIRATORY_TRACT
  Filled 2013-11-03 (×6): qty 3

## 2013-11-03 MED ORDER — METHYLPREDNISOLONE SODIUM SUCC 125 MG IJ SOLR: 60.0000 mg | Freq: Two times a day (BID) | INTRAMUSCULAR | Status: DC

## 2013-11-03 MED ORDER — FENTANYL CITRATE 0.05 MG/ML IJ SOLN
50.0000 ug | Freq: Once | INTRAMUSCULAR | Status: AC
  Administered 2013-11-03: 50 ug via INTRAVENOUS
  Filled 2013-11-03: qty 2

## 2013-11-03 MED ORDER — ENSURE COMPLETE PO LIQD
237.0000 mL | Freq: Two times a day (BID) | ORAL | Status: DC
  Administered 2013-11-03: 237 mL via ORAL

## 2013-11-03 MED ORDER — BUDESONIDE-FORMOTEROL FUMARATE 160-4.5 MCG/ACT IN AERO
2.0000 | INHALATION_SPRAY | Freq: Two times a day (BID) | RESPIRATORY_TRACT | Status: DC
  Filled 2013-11-03: qty 6

## 2013-11-03 MED ORDER — ACETAMINOPHEN 325 MG PO TABS
650.0000 mg | ORAL_TABLET | Freq: Four times a day (QID) | ORAL | Status: DC | PRN
  Administered 2013-11-03: 650 mg via ORAL
  Filled 2013-11-03: qty 2

## 2013-11-03 NOTE — Progress Notes (Signed)
eLink Physician-Brief Progress Note Patient Name: KALIN AMRHEIN DOB: 1970/07/04 MRN: [id number]  Date of Service  11/03/2013   HPI/Events of Note     eICU Interventions  Fentanyl x 1 for pain   Intervention Category Intermediate Interventions: Pain - evaluation and management  BYRUM,ROBERT S. 11/03/2013, 6:24 AM

## 2013-11-03 NOTE — Progress Notes (Signed)
INITIAL NUTRITION ASSESSMENT  DOCUMENTATION CODES Per approved criteria  -Obesity grade 2   INTERVENTION:  Continue Regular diet as tolerated Ensure Complete po BID, each supplement provides 350 kcal and 13 grams of protein  RD to follow.  NUTRITION DIAGNOSIS: Inadequate oral intake related to inability to eat as evidenced by npo status. Met.   New Diagnosis: Inadequate oral intake related to medical status and decreased appetite AEB observed intake.  Goal: Intake of meals and supplements to meet >90% estimated needs.  Monitor:  Intake and diet tolerance, labs, weight trend.  Reason for Assessment: Nutrition assessment with TF recommendations and recommendations.  43 y.o. female  Admitting Dx: <principal problem not specified>  ASSESSMENT: Patient admitted with acute respiratory failure receiving mechanical ventilation, ARDS, SIRS/sepsis, HTN, Hypokalemia, hypothyroidism, HCAP vs viral PNA.    D/C'd from [facility] on 6/12 with diagnosis of CAP and asthmatic exacerbation.  Presented back to [facility] 6/16 with worsening pulmonary infiltrates and acute respiratory failure.  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    6/16: Spoke with RN.  Patient to be placed in the prone position and receive paralytics.  Family not available.  Diet hx unknown but weight relatively stable per e-chart records.  6/19: Extubated 6/17.  C-pap and nausea and vomiting delayed diet initiation until today. Sleeping, ate about 25% regular lunch tolerated.  Height: Ht Readings from Last 1 Encounters:  10/31/13 _0  (1.651 m)    Weight: Wt Readings from Last 1 Encounters:  11/03/13 241 lb 6.5 oz (109.5 kg)    Ideal Body Weight: 125 lbs  % Ideal Body Weight: 188  Wt Readings from Last 10 Encounters:  11/03/13 241 lb 6.5 oz (109.5 kg)  10/26/13 237 lb 3.2 oz (107.593 kg)  06/09/13 237 lb 9.6 oz (107.775 kg)  04/19/13 236 lb (107.049 kg)  03/29/13 243 lb (110.224 kg)  03/22/13 241 lb  (109.317 kg)  02/17/13 243 lb (110.224 kg)  01/18/13 246 lb (111.585 kg)  01/06/13 245 lb 9.6 oz (111.403 kg)  11/10/12 241 lb 6.4 oz (109.498 kg)    Usual Body Weight: 237  % Usual Body Weight: 100  BMI:  Body mass index is 40.17 kg/(m^2).  Estimated Nutritional Needs: Kcal: 1750-1850 Protein: 105-115 gm Fluid: >/=2.4L  Skin: intact  Diet Order: General  EDUCATION NEEDS: -No education needs identified at this time   Intake/Output Summary (Last 24 hours) at 11/03/13 1348 Last data filed at 11/03/13 1000  Gross per 24 hour  Intake 1473.75 ml  Output   1970 ml  Net -496.25 ml    Last BM: unknown   Labs:   Recent Labs Lab 11/01/13 1132 11/02/13 0350 11/03/13 0530  NA 140 138 137  K 3.8 3.7 4.4  CL 99 98 98  CO2 _1 BUN _2 CREATININE 0.84 0.87 0.69  CALCIUM 8.4 8.4 9.1  MG  --  2.2 2.2  PHOS  --  4.4 3.9  GLUCOSE 88 102* 167*    CBG (last 3)   Recent Labs  11/01/13 2336 11/02/13 0357 11/02/13 0739  GLUCAP 94 92 95    Scheduled Meds: . amoxicillin-clavulanate  1 tablet Oral Q12H  . aspirin  81 mg Oral Daily  . enoxaparin (LOVENOX) injection  40 mg Subcutaneous Q24H  . ipratropium-albuterol  3 mL Nebulization Q4H  . [START ON 11/04/2013] levothyroxine  50 mcg Oral QAC breakfast  . pantoprazole  40 mg Oral BID AC    Continuous  Infusions: . dextrose 5 % and 0.45% NaCl 75 mL/hr at 11/03/13 1000    Past Medical History  Diagnosis Date  . History of abnormal cervical Pap smear   . Hypothyroidism        . Asthma     seasonal- rare inhaler use  . Anxiety     no meds  . Depression     no meds  . TIA (transient ischemic attack)     from BCP  . GERD (gastroesophageal reflux disease)   . PTSD (post-traumatic stress disorder)   . History of attempted suicide   . Cervical cancer     Past Surgical History  Procedure Laterality Date  . Conization of cervix  2000  . Hysteroscopy w/d&c  4/09  . Chest wall/lung mass resection   7/03  . Y catheter    . Robotic assisted total hysterectomy N/A 10/11/2012    Procedure: ROBOTIC ASSISTED TOTAL HYSTERECTOMY;  Surgeon: Lyman Speller, MD;  Location: [facility];  Service: Gynecology;  Laterality: N/A;  . Salpingoophorectomy Bilateral 10/11/2012    Procedure: SALPINGO OOPHORECTOMY;  Surgeon: Lyman Speller, MD;  Location: [facility];  Service: Gynecology;  Laterality: Bilateral;  . Dilation and curettage of uterus    . Hysteroscopy  08/29/2003  . Abdominal hysterectomy    . Video assisted thoracoscopy (vats)/thorocotomy      Antonieta Iba, RD, LDN Clinical Inpatient Dietitian Pager:  [phone] Weekend and after hours pager:  [phone]

## 2013-11-03 NOTE — Progress Notes (Signed)
PULMONARY / CRITICAL CARE MEDICINE   Name: Denise Ray MRN: [id number] DOB: Nov 13, 1970    ADMISSION DATE:  10/31/2013 CONSULTATION DATE:  6/16  PRIMARY SERVICE: PCCM  CHIEF COMPLAINT:  Acute respiratory failure  BRIEF PATIENT DESCRIPTION: 43 yo female just d/c from [facility] on 6/12 w/ dx of CAP and asthmatic exacerbation (sent home on azith/amoxicillin and pred taper). Presented back to [facility] 6/16 with worsening pulmonary infiltrates and acute respiratory failure.  SIGNIFICANT EVENTS / STUDIES:  6/16 admission 6/17 improving respiratory status  6/18: steroids started for pneumonitis /hypercapnia   LINES / TUBES: ETT 6/16 >>> 6/17 Left radial arterial line 6/16 >>> 6/17 Right IJ central line 6/16 >>>  CULTURES: resp culture 6/16>>> few WBC, predominantly PMN >>>neg  BCX2 6/16>>>  resp viral panel 6/16>>> Rhinovirus   ANTIBIOTICS: vanc 6/16>>> 6/18 Zosyn 6/16>>> 6/18 levaquin 6/16>>>6/19 augmentin 6/19>>  SUBJECTIVE AND OVERNIGHT: Looks much better   VITAL SIGNS: Temp:  [97.5 F (36.4 C)-98.6 F (37 C)] 97.5 F (36.4 C) (06/19 0821) Pulse Rate:  [45-63] 56 (06/19 0626) Resp:  [12-25] 20 (06/19 0626) BP: (99-126)/(46-66) 122/65 mmHg (06/19 0600) SpO2:  [86 %-98 %] 97 % (06/19 0737) FiO2 (%):  [60 %] 60 % (06/19 0743) Weight:  [109.5 kg (241 lb 6.5 oz)] 109.5 kg (241 lb 6.5 oz) (06/19 0300) 5L [place]  INTAKE / OUTPUT: Intake/Output     06/18 0701 - 06/19 0700 06/19 0701 - 06/20 0700   I.V. (mL/kg) 1646.3 (15)    IV Piggyback 150    Total Intake(mL/kg) 1796.3 (16.4)    Urine (mL/kg/hr) 2400 (0.9)    Emesis/NG output 75 (0)    Total Output 2475     Net -678.8            PHYSICAL EXAMINATION: General: chronically ill appearing female, today more awake and w/out distress  Neuro: A/o x4, follows commands, MAEs HEENT: 4L [place] Cardiovascular: regular, no murmurs Lungs: b/l expiratory wheeze, crackles in bases.  Abdomen: obese, soft, non-distended,  nontender Musculoskeletal: no deformity Skin: no edema, no cyanosis  LABS:  CBC  Recent Labs Lab 10/31/13 1100 11/01/13 1132 11/02/13 0350  WBC 13.2* 13.0* 12.2*  HGB 13.4 12.4 11.4*  HCT 40.1 38.9 35.7*  PLT 307 188 240   Coag's No results found for this basename: APTT, INR,  in the last 168 hours BMET  Recent Labs Lab 11/01/13 1132 11/02/13 0350 11/03/13 0530  NA 140 138 137  K 3.8 3.7 4.4  CL 99 98 98  CO2 31 28 30   BUN 16 16 10   CREATININE 0.84 0.87 0.69  GLUCOSE 88 102* 167*   Electrolytes  Recent Labs Lab 11/01/13 1132 11/02/13 0350 11/03/13 0530  CALCIUM 8.4 8.4 9.1  MG  --  2.2 2.2  PHOS  --  4.4 3.9   Sepsis Markers  Recent Labs Lab 10/31/13 0625 10/31/13 1100 11/01/13 0425 11/01/13 1150 11/02/13 0350  LATICACIDVEN 2.28*  --   --  1.3  --   PROCALCITON  --  <0.10 <0.10  --  <0.10   ABG  Recent Labs Lab 11/01/13 1430 11/01/13 1736 11/02/13 0457  PHART 7.368 7.341* 7.329*  PCO2ART 52.9* 55.3* 55.4*  PO2ART 145.0* 81.1 144.0*   Liver Enzymes  Recent Labs Lab 11/02/13 1620  AST 13  ALT 12  ALKPHOS 51  BILITOT 0.3  ALBUMIN 3.1*   Cardiac Enzymes  Recent Labs Lab 10/31/13 0615  PROBNP 80.6   Glucose  Recent Labs Lab 11/01/13  1138 11/01/13 1614 11/01/13 1930 11/01/13 2336 11/02/13 0357 11/02/13 0739  GLUCAP 87 94 96 94 92 95    Imaging Dg Chest Port 1 View  11/03/2013   CLINICAL DATA:  Shortness of breath and chest pain  EXAM: PORTABLE CHEST - 1 VIEW  COMPARISON:  November 02, 2013  FINDINGS: Central catheter tip is in the superior vena cava near the cavoatrial junction. No pneumothorax. There is no edema or consolidation. Heart is upper normal in size with normal pulmonary vascularity. No adenopathy. No bone lesions.  IMPRESSION: Central catheter as described without pneumothorax. No edema or consolidation.   Electronically Signed   By: Lowella Grip M.D.   On: 11/03/2013 07:32   Dg Chest Port 1  View  11/02/2013   CLINICAL DATA:  pneumonia vs pulm edema, mechanical ventilation  EXAM: PORTABLE CHEST - 1 VIEW  COMPARISON:  Portable chest radiograph 11/01/2013  FINDINGS: Low lung volumes. Cardiac silhouette is enlarged. The patient has been extubated. Enteric tube is been removed. Right internal jugular catheter tip superior vena cava. Persistent bibasilar atelectasis. No acute osseous abnormalities.  IMPRESSION: Persistent bibasilar atelectasis. Slightly accentuated by the low lung volumes.  Patient is status post extubation and enteric tube removal. Right IJ catheter stable.   Electronically Signed   By: Margaree Mackintosh M.D.   On: 11/02/2013 07:44   Dg Chest Port 1 View  11/01/2013   CLINICAL DATA:  Endotracheal tube  EXAM: PORTABLE CHEST - 1 VIEW  COMPARISON:  Chest radiograph chest CT 10/31/2013  FINDINGS: Endotracheal tube in position 3 cm from carina. NG tube extends to the stomach. Right central venous line with tip in the distal SVC. Normal cardiac silhouette. There is bibasilar atelectasis similar prior. Central venous congestion noted. No pneumothorax.  IMPRESSION: 1. Endotracheal tube in good position. 2. Bibasilar atelectasis. 3. Central venous congestion.   Electronically Signed   By: Suzy Bouchard M.D.   On: 11/01/2013 10:57  CXR; aeration a little improved. Left base vol loss.   ASSESSMENT / PLAN:   Acute Hypoxic respiratory failure in Viral pneumonitis (rhinovirus) +/- bacterial super-infection  H/O asthma vs COPD  OSA  Hypercarbia - stable Plan:    Add symbicort (staff MD: change to duoneb q4h due to severe cough and some wheeze at time of MD rounds)  Wean O2  Cont steroids for airway dz and pneumonitis   See ID section  Mobilize  Day 3/10 abx, change to augmentin   Use CPAP at HS, get [place] care to see. RE: face mask instead of nasal pillows  ABG in am after using CPAP  Obesity  GERD Nausea Plan:   Cont bid PPI  Cont PRN reglan  See if changing abx helps nausea     Hypothyroidism  Plan:   Cont synthroid   Anxiety/ pain  Plan:   Restart xanax at lower dose given hypercarbia   Supportive care   TODAY'S SUMMARY: Patient feeling better this morning, more awake. Should be able to wean o2 an mobilize. Will change to SDU status. Staff MD has added duoneb instead of symbicort due to wheeze and cough   Marni Griffon NP  Pulmonary and [place] Pager: [phone]  11/03/2013, 8:41 AM     11/03/2013 8:41 AM   STAFF NOTE  -see my comments above. She is better. No questions from her. Rest per NP  Dr. Brand Males, M.D., [facility].C.P Pulmonary and Critical Care Medicine Staff Physician [place] [facility]  [facility] Pager: [phone], If no answer or between  15:00h - 7:00h: call [phone]  11/03/2013 11:03 AM

## 2013-11-03 NOTE — Progress Notes (Signed)
Pt has been bradycardic throughout the night. Rate has been between 44-50.  BP stable. Pt has been on many antiemetics, is lethargic but awakens and follows commands with all extremities.  PERRL..  Dr. Lamonte Sakai made aware and will continue to monitor throughout the night.

## 2013-11-04 DIAGNOSIS — E039 Hypothyroidism, unspecified: Secondary | ICD-10-CM

## 2013-11-04 LAB — COMPREHENSIVE METABOLIC PANEL
ALT: 13 U/L (ref 0–35)
AST: 14 U/L (ref 0–37)
Albumin: 3 g/dL — ABNORMAL LOW (ref 3.5–5.2)
Alkaline Phosphatase: 45 U/L (ref 39–117)
BUN: 19 mg/dL (ref 6–23)
CO2: 31 meq/L (ref 19–32)
CREATININE: 0.83 mg/dL (ref 0.50–1.10)
Calcium: 9.3 mg/dL (ref 8.4–10.5)
Chloride: 100 mEq/L (ref 96–112)
GFR calc Af Amer: >90 mL/min (ref >90)
GFR, EST NON AFRICAN AMERICAN: 87 mL/min — AB (ref >90)
Glucose, Bld: 128 mg/dL — ABNORMAL HIGH (ref 70–99)
POTASSIUM: 3.5 meq/L — AB (ref 3.7–5.3)
Sodium: 141 mEq/L (ref 137–147)
Total Protein: 6.2 g/dL (ref 6.0–8.3)

## 2013-11-04 LAB — CBC
HEMATOCRIT: 36.8 % (ref 36.0–46.0)
Hemoglobin: 11.8 g/dL — ABNORMAL LOW (ref 12.0–15.0)
MCH: 29.2 pg (ref 26.0–34.0)
MCHC: 32.1 g/dL (ref 30.0–36.0)
MCV: 91.1 fL (ref 78.0–100.0)
Platelets: 238 10*3/uL (ref 150–400)
RBC: 4.04 MIL/uL (ref 3.87–5.11)
RDW: 13.5 % (ref 11.5–15.5)
WBC: 14.5 10*3/uL — AB (ref 4.0–10.5)

## 2013-11-04 LAB — BLOOD GAS, ARTERIAL
Acid-Base Excess: 5.1 mmol/L — ABNORMAL HIGH (ref 0.0–2.0)
Bicarbonate: 30.4 mEq/L — ABNORMAL HIGH (ref 20.0–24.0)
Drawn by: 331761
O2 Content: 5 L/min
O2 SAT: 98.7 %
PATIENT TEMPERATURE: 97.1
PO2 ART: 123 mmHg — AB (ref 80.0–100.0)
TCO2: 27.3 mmol/L (ref 0–100)
pCO2 arterial: 47.6 mmHg — ABNORMAL HIGH (ref 35.0–45.0)
pH, Arterial: 7.417 (ref 7.350–7.450)

## 2013-11-04 MED ORDER — MAGIC MOUTHWASH: 5.0000 mL | Freq: Four times a day (QID) | ORAL | Status: DC | PRN

## 2013-11-04 MED ORDER — ALBUTEROL SULFATE (2.5 MG/3ML) 0.083% IN NEBU: 2.5000 mg | INHALATION_SOLUTION | RESPIRATORY_TRACT | Status: DC | PRN

## 2013-11-04 MED ORDER — ARFORMOTEROL TARTRATE 15 MCG/2ML IN NEBU
15.0000 ug | INHALATION_SOLUTION | Freq: Two times a day (BID) | RESPIRATORY_TRACT | Status: DC
  Administered 2013-11-04 (×2): 15 ug via RESPIRATORY_TRACT
  Filled 2013-11-04 (×2): qty 2

## 2013-11-04 MED ORDER — BUDESONIDE 0.5 MG/2ML IN SUSP
0.5000 mg | Freq: Two times a day (BID) | RESPIRATORY_TRACT | Status: DC
  Administered 2013-11-04 (×2): 0.5 mg via RESPIRATORY_TRACT
  Filled 2013-11-04 (×2): qty 2

## 2013-11-04 MED ORDER — DEXTROSE-NACL 5-0.45 % IV SOLN: INTRAVENOUS | Status: DC | PRN

## 2013-11-04 MED ORDER — NYSTATIN 100000 UNIT/ML MT SUSP
5.0000 mL | Freq: Four times a day (QID) | OROMUCOSAL | Status: DC
  Administered 2013-11-04 – 2013-11-05 (×4): 500000 [IU] via ORAL
  Filled 2013-11-04 (×4): qty 5

## 2013-11-04 NOTE — Progress Notes (Signed)
Spoke with pt regarding cpap.  Pt stated she would like to go on cpap around midnight.

## 2013-11-04 NOTE — Evaluation (Signed)
Physical Therapy One Time Evaluation Patient Details Name: Denise Ray MRN: [id number] DOB: 1970/05/26 Today's Date: 11/04/2013   History of Present Illness  43 yo female discharged from [facility] on 6/12 with dx of CAP and asthmatic exacerbation and presented back to [facility] ED 6/16 with worsening pulmonary infiltrates and acute respiratory failure.  Clinical Impression  Patient evaluated by Physical Therapy with no further acute PT needs identified. All education has been completed and the patient has no further questions. Pt able to ambulate good distance in hallway however slightly SOB and fatigued since first time up in a few days.  Pt reports she does not need f/u PT and agreeable to ambulate with staff during acute stay.  Pt also aware staff will likely attempt to wean her oxygen and check saturations to see if home O2 is needed.  Pt states she was just weaned down to 2L O2 from 5L this morning so maintained 2L O2 throughout session today.  Pt will likely return to baseline quickly and currently supervision level. See below for any follow-up Physial Therapy or equipment needs. PT is signing off. Thank you for this referral.     Follow Up Recommendations No PT follow up    Equipment Recommendations  None recommended by PT    Recommendations for Other Services       Precautions / Restrictions Precautions Precautions: None      Mobility  Bed Mobility               General bed mobility comments: pt up in recliner on arrival  Transfers Overall transfer level: Modified independent                  Ambulation/Gait Ambulation/Gait assistance: Supervision;Min guard Ambulation Distance (Feet): 320 Feet Assistive device: None Gait Pattern/deviations: Step-through pattern Gait velocity: decr   General Gait Details: pt pushed IV pole, pt reports SOB near end of ambulation however did not wish to rate, SpO2 >95% on 2L O2 during ambulation, no unsteady or LOB observed  Stairs            Wheelchair Mobility    Modified Rankin (Stroke Patients Only)       Balance                                             Pertinent Vitals/Pain No c/o pain    Home Living Family/patient expects to be discharged to:: Private residence Living Arrangements: Children;Parent     Home Access: Stairs to enter   Technical brewer of Steps: 2-3 Home Layout: One level Home Equipment: None      Prior Function Level of Independence: Independent         Comments: 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, newspaper        Extremity/Trunk Assessment   Upper Extremity Assessment: Overall WFL for tasks assessed           Lower Extremity Assessment: Overall WFL for tasks assessed         Communication   Communication: No difficulties  Cognition Arousal/Alertness: Awake/alert Behavior During Therapy: WFL for tasks assessed/performed Overall Cognitive Status: Within Functional Limits for tasks assessed                      General Comments      Exercises  Assessment/Plan    PT Assessment Patent does not need any further PT services  PT Diagnosis     PT Problem List    PT Treatment Interventions     PT Goals (Current goals can be found in the Care Plan section) Acute Rehab PT Goals PT Goal Formulation: No goals set, d/c therapy    Frequency     Barriers to discharge        Co-evaluation               End of Session Equipment Utilized During Treatment: Oxygen Activity Tolerance: Patient tolerated treatment well Patient left: in chair;with call bell/phone within reach           Time: 1152-1203 PT Time Calculation (min): 11 min   Charges:   PT Evaluation $Initial PT Evaluation Tier I: 1 Procedure PT Treatments $Gait Training: 8-22 mins   PT G Codes:          LEMYRE,KATHrine E 11/04/2013, 12:26 PM Carmelia Bake, PT, DPT 11/04/2013 Pager: [phone]

## 2013-11-04 NOTE — Progress Notes (Signed)
PULMONARY / CRITICAL CARE MEDICINE   Name: Denise Ray MRN: [id number] DOB: [birth year]    ADMISSION DATE:  10/31/2013  CHIEF COMPLAINT:  Acute respiratory failure  BRIEF PATIENT DESCRIPTION:  28 female smoker presented with progressive pulmonary infiltrates and hypoxia after recent tx for CAP and asthma exacerbation.  She works as Statistician.  SIGNIFICANT EVENTS: 6/16 Admit, VDRF, ARDS protocol 6/17 Extubated, BiPAP needed after extubation 6/18 Started steroids for pneumonitis  STUDIES:  6/16 CT chest >> b/l lower lobe ASD 6/16 Labs >> Scl 70 < 1, ds DNA < 1, CCP < 2, ANCA negative, ANA negative, SSA < 1, SSB < 1  LINES / TUBES: ETT 6/16 >>> 6/17 Left radial arterial line 6/16 >>> 6/17 Right IJ central line 6/16 >>>  CULTURES: 6/16 Pneumococcal Ag >> negative 6/16 Legionella Ag >> negative 6/16 Respiratory viral panel >> Rhinovirus 6/16 Sputum >> oral flora 6/16 Blood >>   ANTIBIOTICS: vanc 6/16>>> 6/18 Zosyn 6/16>>> 6/18 levaquin 6/16>>>6/19 augmentin 6/19>>  SUBJECTIVE: C/o soreness on tongue, and chest feels more tight.  VITAL SIGNS: Temp:  [97.1 F (36.2 C)-98.5 F (36.9 C)] 98 F (36.7 C) (06/20 0814) Pulse Rate:  [54-81] 58 (06/20 0648) Resp:  [12-18] 14 (06/20 0648) BP: (98-138)/(44-72) 98/48 mmHg (06/20 0400) SpO2:  [92 %-100 %] 97 % (06/20 0744) Weight:  [243 lb 9.7 oz (110.5 kg)] 243 lb 9.7 oz (110.5 kg) (06/20 0400)  INTAKE / OUTPUT: Intake/Output     06/19 0701 - 06/20 0700 06/20 0701 - 06/21 0700   I.V. (mL/kg) 1080.4 (9.8)    IV Piggyback     Total Intake(mL/kg) 1080.4 (9.8)    Urine (mL/kg/hr) 1229 (0.5)    Emesis/NG output     Total Output 1229     Net -148.6            PHYSICAL EXAMINATION: General: sitting up in bed, speaks in full sentences Neuro: alert, normal strength HEENT: mild white exudate in posterior pharynx Cardiovascular: regular, no murmur Lungs: b/l rhonchi Abdomen: soft, non tender Musculoskeletal:  no edema Skin: no rashes  LABS:  CBC  Recent Labs Lab 11/01/13 1132 11/02/13 0350 11/04/13 0433  WBC 13.0* 12.2* 14.5*  HGB 12.4 11.4* 11.8*  HCT 38.9 35.7* 36.8  PLT 188 240 238   BMET  Recent Labs Lab 11/02/13 0350 11/03/13 0530 11/04/13 0433  NA 138 137 141  K 3.7 4.4 3.5*  CL 98 98 100  CO2 28 30 31   BUN 16 10 19   CREATININE 0.87 0.69 0.83  GLUCOSE 102* 167* 128*   Electrolytes  Recent Labs Lab 11/02/13 0350 11/03/13 0530 11/04/13 0433  CALCIUM 8.4 9.1 9.3  MG 2.2 2.2  --   PHOS 4.4 3.9  --    Sepsis Markers  Recent Labs Lab 10/31/13 0625 10/31/13 1100 11/01/13 0425 11/01/13 1150 11/02/13 0350  LATICACIDVEN 2.28*  --   --  1.3  --   PROCALCITON  --  <0.10 <0.10  --  <0.10   ABG  Recent Labs Lab 11/01/13 1736 11/02/13 0457 11/04/13 0653  PHART 7.341* 7.329* 7.417  PCO2ART 55.3* 55.4* 47.6*  PO2ART 81.1 144.0* 123.0*   Liver Enzymes  Recent Labs Lab 11/02/13 1620 11/04/13 0433  AST 13 14  ALT 12 13  ALKPHOS 51 45  BILITOT 0.3 <0.2*  ALBUMIN 3.1* 3.0*   Cardiac Enzymes  Recent Labs Lab 10/31/13 0615  PROBNP 80.6   Glucose  Recent Labs Lab 11/01/13 1138 11/01/13 1614 11/01/13  1930 11/01/13 2336 11/02/13 0357 11/02/13 0739  GLUCAP 87 94 96 94 92 95    Imaging Dg Chest Port 1 View  11/03/2013   CLINICAL DATA:  Shortness of breath and chest pain  EXAM: PORTABLE CHEST - 1 VIEW  COMPARISON:  November 02, 2013  FINDINGS: Central catheter tip is in the superior vena cava near the cavoatrial junction. No pneumothorax. There is no edema or consolidation. Heart is upper normal in size with normal pulmonary vascularity. No adenopathy. No bone lesions.  IMPRESSION: Central catheter as described without pneumothorax. No edema or consolidation.   Electronically Signed   By: Lowella Grip M.D.   On: 11/03/2013 07:32    ASSESSMENT / PLAN:  Acute hypoxic, hypercapnic respiratory failure 2nd to viral pneumonitis +/- bacterial  super infection. Asthma exacerbation. Presumed OSA. Tobacco abuse. Plan: Change to pulmicort/brovana PRN albuterol F/u CXR CPAP qhs Day 4/10 of Abx, currently on augmentin  Hx of GERD. Plan: BID protonix Regular diet  Thrush. Plan: Nystatin Magic mouth wash  Hx of hypothyroidism. Plan: Continue synthroid  Hx of anxiety. Plan: PRN xanax  Deconditioning. Plan: PT consulted Mobilize  Updated family at bedside.  Keep in SDU 6/20 >> if stable, then progress to [facility] 6/21.  Chesley Mires, MD [facility] Pulmonary/Critical Care 11/04/2013, 9:01 AM Pager:  [phone] After 3pm call: [phone]

## 2013-11-04 NOTE — Progress Notes (Signed)
Pt. Was placed on CPAP of 8cm H2O per home settings via FFM (what pt. Wears at home) with 5L O2 bled in. Pt. Is tolerating CPAP well at this time without any complications.

## 2013-11-05 ENCOUNTER — Telehealth: Payer: Self-pay | Admitting: Adult Health

## 2013-11-05 ENCOUNTER — Inpatient Hospital Stay (HOSPITAL_COMMUNITY): Payer: 59

## 2013-11-05 IMAGING — CR DG CHEST 1V PORT
1 series · 1 of 1 positions shown · non-contrast
Comparison: [DATE]

CLINICAL DATA: Followup pneumonia, some residual shortness of
breath

EXAM:
PORTABLE CHEST - 1 VIEW

[AP]
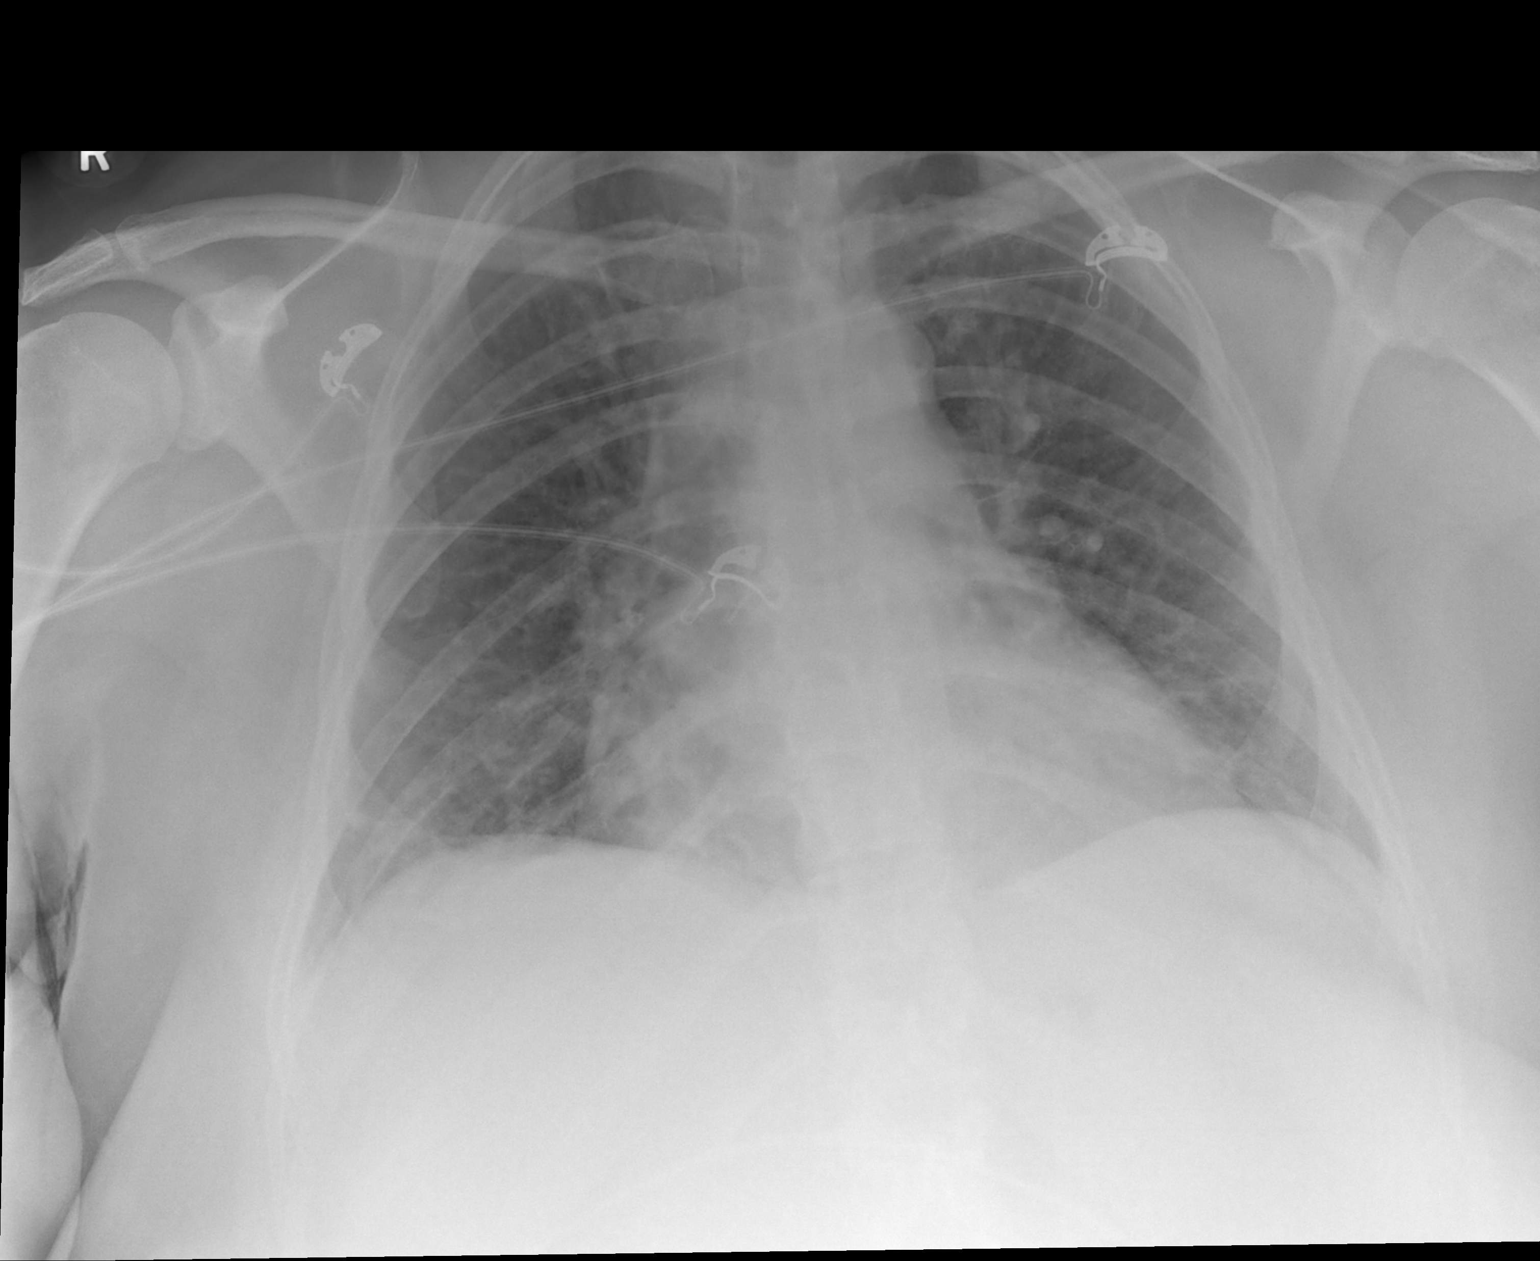

[1 of 1 positions shown; findings below may reference images not displayed]

FINDINGS: Central line has been removed. Mild cardiac enlargement stable. Mild
vascular congestion present. There is improved aeration in the
retrocardiac left lower lobe. No evidence of pulmonary edema or
infiltrate. Mild bilateral lower lobe atelectasis.
IMPRESSION: Cardiac enlargement with mild vascular congestion. Mild bibasilar
atelectasis.

## 2013-11-05 MED ORDER — MAGIC MOUTHWASH: 5.0000 mL | Freq: Four times a day (QID) | ORAL | Status: DC | PRN

## 2013-11-05 MED ORDER — BUDESONIDE-FORMOTEROL FUMARATE 160-4.5 MCG/ACT IN AERO
2.0000 | INHALATION_SPRAY | Freq: Two times a day (BID) | RESPIRATORY_TRACT | Status: DC
  Administered 2013-11-05: 2 via RESPIRATORY_TRACT
  Filled 2013-11-05: qty 6

## 2013-11-05 MED ORDER — NYSTATIN 100000 UNIT/ML MT SUSP: 5.0000 mL | Freq: Four times a day (QID) | OROMUCOSAL | Status: AC

## 2013-11-05 MED ORDER — AMOXICILLIN-POT CLAVULANATE 875-125 MG PO TABS: 1.0000 | ORAL_TABLET | Freq: Two times a day (BID) | ORAL | Status: AC

## 2013-11-05 MED ORDER — MOMETASONE FURO-FORMOTEROL FUM 100-5 MCG/ACT IN AERO: 2.0000 | INHALATION_SPRAY | Freq: Two times a day (BID) | RESPIRATORY_TRACT | Status: DC

## 2013-11-05 MED ORDER — ALPRAZOLAM 1 MG PO TABS: 0.5000 mg | ORAL_TABLET | Freq: Four times a day (QID) | ORAL | Status: DC | PRN

## 2013-11-05 NOTE — Progress Notes (Signed)
PULMONARY / CRITICAL CARE MEDICINE   Name: Denise Ray MRN: [id number] DOB: 21-Mar-1971    ADMISSION DATE:  10/31/2013  CHIEF COMPLAINT:  Acute respiratory failure  BRIEF PATIENT DESCRIPTION:  34 female smoker presented with progressive pulmonary infiltrates and hypoxia after recent tx for CAP and asthma exacerbation.  She works as Statistician.  SIGNIFICANT EVENTS: 6/16 Admit, VDRF, ARDS protocol 6/17 Extubated, BiPAP needed after extubation 6/18 Started steroids for pneumonitis 6/19 Off steroids  STUDIES:  6/16 CT chest >> b/l lower lobe ASD 6/16 Labs >> Scl 70 < 1, ds DNA < 1, CCP < 2, ANCA negative, ANA negative, SSA < 1, SSB < 1  LINES / TUBES: ETT 6/16 >>> 6/17 Left radial arterial line 6/16 >>> 6/17 Right IJ central line 6/16 >>> 6/20  CULTURES: 6/16 Pneumococcal Ag >> negative 6/16 Legionella Ag >> negative 6/16 Respiratory viral panel >> Rhinovirus 6/16 Sputum >> oral flora 6/16 Blood >>   ANTIBIOTICS: vanc 6/16>>> 6/18 Zosyn 6/16>>> 6/18 levaquin 6/16>>>6/19 augmentin 6/19>>  SUBJECTIVE: Feels much better.  Denies chest pain, cough, or sputum.  Slept well with CPAP.  VITAL SIGNS: Temp:  [97.8 F (36.6 C)-98.5 F (36.9 C)] 98.4 F (36.9 C) (06/21 0400) Pulse Rate:  [52-91] 56 (06/21 0700) Resp:  [14-22] 17 (06/21 0700) BP: (104-131)/(50-67) 116/50 mmHg (06/21 0500) SpO2:  [86 %-99 %] 93 % (06/21 0700) Weight:  [246 lb 14.6 oz (112 kg)] 246 lb 14.6 oz (112 kg) (06/21 0500) Room air  INTAKE / OUTPUT: Intake/Output     06/20 0701 - 06/21 0700 06/21 0701 - 06/22 0700   P.O. 460    I.V. (mL/kg)     Total Intake(mL/kg) 460 (4.1)    Urine (mL/kg/hr)     Total Output       Net +460          Urine Occurrence 5 x      PHYSICAL EXAMINATION: General: sitting up in bed, speaks in full sentences Neuro: alert, normal strength HEENT: no oral lesions Cardiovascular: regular, no murmur Lungs: no wheeze/rales Abdomen: soft, non  tender Musculoskeletal: no edema Skin: no rashes  LABS:  CBC  Recent Labs Lab 11/01/13 1132 11/02/13 0350 11/04/13 0433  WBC 13.0* 12.2* 14.5*  HGB 12.4 11.4* 11.8*  HCT 38.9 35.7* 36.8  PLT 188 240 238   BMET  Recent Labs Lab 11/02/13 0350 11/03/13 0530 11/04/13 0433  NA 138 137 141  K 3.7 4.4 3.5*  CL 98 98 100  CO2 28 30 31   BUN 16 10 19   CREATININE 0.87 0.69 0.83  GLUCOSE 102* 167* 128*   Electrolytes  Recent Labs Lab 11/02/13 0350 11/03/13 0530 11/04/13 0433  CALCIUM 8.4 9.1 9.3  MG 2.2 2.2  --   PHOS 4.4 3.9  --    Sepsis Markers  Recent Labs Lab 10/31/13 0625 10/31/13 1100 11/01/13 0425 11/01/13 1150 11/02/13 0350  LATICACIDVEN 2.28*  --   --  1.3  --   PROCALCITON  --  <0.10 <0.10  --  <0.10   ABG  Recent Labs Lab 11/01/13 1736 11/02/13 0457 11/04/13 0653  PHART 7.341* 7.329* 7.417  PCO2ART 55.3* 55.4* 47.6*  PO2ART 81.1 144.0* 123.0*   Liver Enzymes  Recent Labs Lab 11/02/13 1620 11/04/13 0433  AST 13 14  ALT 12 13  ALKPHOS 51 45  BILITOT 0.3 <0.2*  ALBUMIN 3.1* 3.0*   Cardiac Enzymes  Recent Labs Lab 10/31/13 0615  PROBNP 80.6   Glucose  Recent Labs Lab 11/01/13 1138 11/01/13 1614 11/01/13 1930 11/01/13 2336 11/02/13 0357 11/02/13 0739  GLUCAP 87 94 96 94 92 95    Imaging No results found.  ASSESSMENT / PLAN:  Acute hypoxic, hypercapnic respiratory failure 2nd to viral pneumonitis +/- bacterial super infection. Asthma exacerbation. Presumed OSA. Tobacco abuse. Plan: Start dulera 6/21 >> continue until re-assessed as outpt PRN albuterol CPAP qhs Day 5/7 of Abx, currently on augmentin  Hx of GERD. Plan: BID protonix Regular diet  Thrush. Plan: Nystatin day 2 of 5 Magic mouth wash prn  Hx of hypothyroidism. Plan: Continue synthroid  Hx of anxiety. Plan: PRN xanax  Deconditioning >> evaluated by PT 6/20 >> no PT needs. Plan: Activity as tolerated  Much improved.  Plan for d/c  home 6/21.  She will need outpt f/u with Tammy Parrett or Dr. Halford Chessman or Dr. Chase Caller within one week.  She will need f/u with Dr. Tawanna Sat office (PCP) within one - two weeks.  Chesley Mires, MD [facility] Pulmonary/Critical Care 11/05/2013, 8:04 AM Pager:  [phone] After 3pm call: [phone]

## 2013-11-05 NOTE — Discharge Summary (Signed)
Physician Discharge Summary  Patient ID: Denise Ray MRN: [id number] DOB/AGE: [birth year] 43 y.o.  Admit date: 10/31/2013 Discharge date: 11/05/2013    Admission Diagnoses:  Acute asthma exacerbation  Atypical community acquired pneumonia  Hypothyroidism  Hypokalemia  Obesity   Discharge Diagnoses:  Acute asthma exacerbation  Acute hypoxic, hypercapnic respiratory failure  ARDS  Atypical community acquired pneumonia with Rhinovirus  Obstructive sleep apnea  Thrush  Tobacco abuse  GERD  Hypothyroidism  Anxiety  Depression  PTSD  Deconditioning  Hospital Course:  43 y.o. female smoker presented to hospital with 6 days of chest tightness, productive cough, and low grade fevers. She had frequent use of her albuterol. She also had pleuritic type chest pain. She was admitted by hospitalist service. She developed progressive respiratory distress. PCCM was consulted. She was transferred to ICU, and required intubation on 6/16. There was initial concern for ARDS. She had CT chest which showed b/l lower lobe consolidation. She was initially started on vancomycin, levaquin and zosyn. Her sputum cultures were negative. Her respiratory viral panel was positive for rhinovirus. She was successfully extubated on 6/17. She required BiPAP after extubation. She was then transitioned to CPAP, and no longer required supplemental oxygen. She did require solumedrol for several days, but this was successfully discontinued. She was treated with nebulized pulmicort, brovana, and albuterol. She was transitioned off IV Abx to oral augmentin. She was evaluated by physical therapy, and felt that she did not have any outpt physical therapy needs.   Consults: pulmonary/intensive care   Significant Diagnostic Studies:  6/16 CT chest >> b/l lower lobe ASD  6/16 Labs >> Scl 70 < 1, ds DNA < 1, CCP < 2, ANCA negative, ANA negative, SSA < 1, SSB < 1   Treatments:  Intubation 6/16 to 6/17  Lt radial arterial line  6/16 to 617  Rt IJ central line 6/16 to 6/20  BiPAP  CPAP  vanc 6/16>>> 6/18  Zosyn 6/16>>> 6/18  levaquin 6/16>>>6/19  augmentin 6/19>>   CULTURES:  6/16 Pneumococcal Ag >> negative  6/16 Legionella Ag >> negative  6/16 Respiratory viral panel >> Rhinovirus  6/16 Sputum >> oral flora  6/16 Blood >> ngtd>>>                                                                   D/c plan by Discharge Diagnosis  Acute hypoxic, hypercapnic respiratory failure 2nd to viral pneumonitis +/- bacterial super infection.  Asthma exacerbation.  Presumed OSA.  Tobacco abuse.  Plan:  Start dulera 6/21 >> continue until re-assessed as outpt  PRN albuterol  CPAP qhs  Day 5/7 of Abx, currently on augmentin   Hx of GERD.  Plan:  BID protonix  Regular diet   Thrush.  Plan:  Nystatin day 2 of 5  Magic mouth wash prn   Hx of hypothyroidism.  Plan:  Continue synthroid   Hx of anxiety.  Plan:  PRN xanax   Deconditioning >> evaluated by PT 6/20 >> no PT needs.  Plan:  Activity as tolerated    Much improved. Plan for d/c home 6/21. She will need outpt f/u with Tammy Parrett or Dr. Halford Chessman or Dr. Chase Caller within one week. She will need f/u with Dr. Tawanna Sat office (PCP) within  one - two weeks.    Filed Vitals:   11/05/13 0500 11/05/13 0700 11/05/13 0800 11/05/13 0921  BP: 116/50     Pulse: 52 56    Temp:   97.9 F (36.6 C)   TempSrc:   Oral   Resp: 14 17    Height:      Weight: 246 lb 14.6 oz (112 kg)     SpO2: 96% 93%  96%    PHYSICAL EXAMINATION:  General: sitting up in bed, speaks in full sentences  Neuro: alert, normal strength  HEENT: no oral lesions  Cardiovascular: regular, no murmur  Lungs: no wheeze/rales  Abdomen: soft, non tender  Musculoskeletal: no edema  Skin: no rashes   Discharge Labs  BMET  Recent Labs Lab 10/31/13 0627 10/31/13 1100 11/01/13 1132 11/02/13 0350 11/03/13 0530 11/04/13 0433  NA 143  --  140 138 137 141  K 3.3*  --  3.8 3.7 4.4  3.5*  CL 102  --  99 98 98 100  CO2  --   --  _0 GLUCOSE 102*  --  88 102* 167* 128*  BUN 13  --  _1 CREATININE 0.80 0.82 0.84 0.87 0.69 0.83  CALCIUM  --   --  8.4 8.4 9.1 9.3  MG  --   --   --  2.2 2.2  --   PHOS  --   --   --  4.4 3.9  --      CBC   Recent Labs Lab 11/01/13 1132 11/02/13 0350 11/04/13 0433  HGB 12.4 11.4* 11.8*  HCT 38.9 35.7* 36.8  WBC 13.0* 12.2* 14.5*  PLT 188 240 238         Follow-up Information   Follow up with SOOD,VINEET, MD In 1 week. (office will call you with appointment )    Specialty:  Pulmonary Disease   Contact information:   [address] [address] [phone]       Follow up with Redge Gainer, MD. Schedule an appointment as soon as possible for a visit in 2 weeks.   Specialty:  Family Medicine   Contact information:   [place] [address] [phone]          Medication List    STOP taking these medications       amoxicillin 875 MG tablet  Commonly known as:  AMOXIL     azithromycin 250 MG tablet  Commonly known as:  ZITHROMAX     predniSONE 50 MG tablet  Commonly known as:  DELTASONE      TAKE these medications       albuterol (5 MG/ML) 0.5% nebulizer solution  Commonly known as:  PROVENTIL  Take 2.5 mg by nebulization every 6 (six) hours as needed for wheezing or shortness of breath.     albuterol 108 (90 BASE) MCG/ACT inhaler  Commonly known as:  PROVENTIL HFA;VENTOLIN HFA  Inhale 2 puffs into the lungs every 6 (six) hours as needed. wheezing     ALPRAZolam 1 MG tablet  Commonly known as:  XANAX  Take 0.5 tablets (0.5 mg total) by mouth 4 (four) times daily as needed for anxiety.     amoxicillin-clavulanate 875-125 MG per tablet  Commonly known as:  AUGMENTIN  Take 1 tablet by mouth every 12 (twelve) hours.     aspirin EC 81 MG tablet  Take 81 mg by mouth daily.  benzonatate 100 MG capsule  Commonly known as:  TESSALON PERLES  Take 1  capsule (100 mg total) by mouth 3 (three) times daily as needed for cough.     HYDROcodone-acetaminophen 5-325 MG per tablet  Commonly known as:  NORCO/VICODIN  Take 1 tablet by mouth every 6 (six) hours as needed for moderate pain.     levothyroxine 50 MCG tablet  Commonly known as:  SYNTHROID, LEVOTHROID  Take 50 mcg by mouth daily before breakfast.     magic mouthwash Soln  Take 5 mLs by mouth every 6 (six) hours as needed for mouth pain.     mometasone-formoterol 100-5 MCG/ACT Aero  Commonly known as:  DULERA  Inhale 2 puffs into the lungs 2 (two) times daily.     nystatin 100000 UNIT/ML suspension  Commonly known as:  MYCOSTATIN  Take 5 mLs (500,000 Units total) by mouth 4 (four) times daily.     pantoprazole 40 MG tablet  Commonly known as:  PROTONIX  Take 40 mg by mouth daily.          Disposition: 01-Home or Self Care  Discharged Condition: MARAE COTTRELL has met maximum benefit of inpatient care and is medically stable and cleared for discharge.  Patient is pending follow up as above.      Time spent on disposition:  Greater than 35 minutes.   SignedDarlina Sicilian, NP 11/05/2013  10:04 AM Pager: [phone] or [phone]  *Care during the described time interval was provided by me and/or other providers on the critical care team. I have reviewed this patient's available data, including medical history, events of note, physical examination and test results as part of my evaluation.    Chesley Mires, MD [facility] Pulmonary/Critical Care Pager:  [phone] After 3pm call: [phone]

## 2013-11-05 NOTE — Telephone Encounter (Signed)
Needs hospital f/u with VS, TP or MR within 1 week.

## 2013-11-05 NOTE — Progress Notes (Signed)
Patient stable, no complaints of pain, and vital signs stable. Patient understands discharge instructions and was able to verbalize back medications and understanding for follow-up appointments.  Patient has no questions. Patient left with family member.

## 2013-11-06 LAB — CULTURE, BLOOD (ROUTINE X 2)
CULTURE: NO GROWTH
Culture: NO GROWTH

## 2013-11-06 NOTE — Telephone Encounter (Signed)
lmtcb x1 

## 2013-11-07 NOTE — Telephone Encounter (Signed)
lmomtcb x2 for pt 

## 2013-11-08 NOTE — Telephone Encounter (Signed)
Spoke with the pt She is already scheduled with [facility] for 11/10/13

## 2013-11-10 ENCOUNTER — Encounter: Payer: Self-pay | Admitting: Pulmonary Disease

## 2013-11-10 ENCOUNTER — Ambulatory Visit (INDEPENDENT_AMBULATORY_CARE_PROVIDER_SITE_OTHER): Payer: 59 | Admitting: Pulmonary Disease

## 2013-11-10 VITALS — BP 125/72 | HR 65 | Temp 97.7°F | Ht 64.0 in | Wt 234.6 lb

## 2013-11-10 DIAGNOSIS — J45909 Unspecified asthma, uncomplicated: Secondary | ICD-10-CM

## 2013-11-10 DIAGNOSIS — G4733 Obstructive sleep apnea (adult) (pediatric): Secondary | ICD-10-CM | POA: Insufficient documentation

## 2013-11-10 NOTE — Patient Instructions (Signed)
Stay on symbicort twice a day everyday. Will send an order to [facility] to get you a new cpap mask and set your machine on auto. Will see you back in 4 weeks with chest xray before the visit on the same day.   Please bring your cpap machine with you to the next visit with power cord and card. Please call if you feel you are not making progress from a breathing standpoint.

## 2013-11-10 NOTE — Assessment & Plan Note (Signed)
The patient has a diagnosis of obstructive sleep apnea obtained by a home sleep test in the past. She has a CPAP machine that apparently is set on a low pressure, and she has not been very compliant with the device. I would like to have her machine set on a broader arrange with the auto setting, and will also work on getting her a better CPAP mask. Will get a download off her device at the next visit to check on her response.

## 2013-11-10 NOTE — Assessment & Plan Note (Signed)
The patient was recently in the hospital for an acute asthma exacerbation related to either a viral or bacterial illness. She required endotracheal intubation and ventilatory support and told she ultimately improved. She currently is on Symbicort that she is taking regularly, and although she is better, she still has dyspnea on exertion and some cough. I reminded her that she has had a very difficult hospitalization, and that I expect her to make slow progress over time. I would like to keep her on her current medications, and see her back in 4 weeks with a chest x-ray. She is to call us if she has worsening symptoms in the interim.

## 2013-11-10 NOTE — Progress Notes (Signed)
   Subjective:    Patient ID: Denise Ray, female    DOB: 11/15/1970, 43 y.o.   MRN: [id number]  HPI The patient comes in today for a post hospital followup. I have never seen her in the past, and she was recently on the pulmonary service with an assistant acute asthma exacerbation, as well as viral versus bacterial pneumonia. She required mechanical ventilation while on antibiotics and IV steroids. She was ultimately extubated, and transitioned to BiPAP. She ultimately came off this as well as all oxygen and was discharged home. The patient also has a history of sleep apnea for which she has been on CPAP, but noncompliant. She is trying to use this a little more at home, but does not feel the pressure is adequate. She has quit smoking since being discharged in the hospital, and is staying on her asthma medications. She feels that she is definitely improved, but still has some dry hacking cough and dyspnea on exertion. She is not having any fevers, chills, or sweats.   Review of Systems  Constitutional: Negative for fever and unexpected weight change.  HENT: Negative for congestion, dental problem, ear pain, nosebleeds, postnasal drip, rhinorrhea, sinus pressure, sneezing, sore throat and trouble swallowing.   Eyes: Negative for redness and itching.  Respiratory: Positive for cough, chest tightness, shortness of breath and wheezing.   Cardiovascular: Negative for palpitations and leg swelling.  Gastrointestinal: Negative for nausea and vomiting.  Genitourinary: Negative for dysuria.  Musculoskeletal: Negative for joint swelling.  Skin: Negative for rash.  Neurological: Negative for headaches.  Hematological: Does not bruise/bleed easily.  Psychiatric/Behavioral: Negative for dysphoric mood. The patient is not nervous/anxious.        Objective:   Physical Exam Obese female in no acute distress Nose without purulence or discharge noted Neck without lymphadenopathy or thyromegaly No skin  breakdown or pressure necrosis from the CPAP mask Chest with mild coarse breath sounds, no wheezes or rhonchi. Excellent airflow bilaterally Cardiac exam with regular rate and rhythm Lower extremities without edema, no cyanosis Alert and oriented, moves all 4 extremities.       Assessment & Plan:

## 2013-11-10 NOTE — Assessment & Plan Note (Signed)
>>  ASSESSMENT AND PLAN FOR INTRINSIC ASTHMA WRITTEN ON 11/10/2013 12:03 PM BY Kathee Delton, MD  The patient was recently in the hospital for an acute asthma exacerbation related to either a viral or bacterial illness. She required endotracheal intubation and ventilatory support and told she ultimately improved. She currently is on Symbicort that she is taking regularly, and although she is better, she still has dyspnea on exertion and some cough. I reminded her that she has had a very difficult hospitalization, and that I expect her to make slow progress over time. I would like to keep her on her current medications, and see her back in 4 weeks with a chest x-ray. She is to call us if she has worsening symptoms in the interim.

## 2013-12-11 ENCOUNTER — Ambulatory Visit: Payer: Self-pay | Admitting: Pulmonary Disease

## 2013-12-18 ENCOUNTER — Encounter: Payer: Self-pay | Admitting: *Deleted

## 2013-12-18 ENCOUNTER — Encounter: Payer: Self-pay | Admitting: Pulmonary Disease

## 2013-12-18 ENCOUNTER — Ambulatory Visit (INDEPENDENT_AMBULATORY_CARE_PROVIDER_SITE_OTHER)
Admission: RE | Admit: 2013-12-18 | Discharge: 2013-12-18 | Disposition: A | Discharge status: Home / Self Care | Payer: 59 | Source: Ambulatory Visit | Attending: Pulmonary Disease | Admitting: Pulmonary Disease

## 2013-12-18 ENCOUNTER — Ambulatory Visit (INDEPENDENT_AMBULATORY_CARE_PROVIDER_SITE_OTHER): Payer: 59 | Admitting: Pulmonary Disease

## 2013-12-18 VITALS — BP 140/80 | HR 83 | Temp 97.8°F | Ht 64.0 in | Wt 245.4 lb

## 2013-12-18 DIAGNOSIS — J189 Pneumonia, unspecified organism: Secondary | ICD-10-CM

## 2013-12-18 DIAGNOSIS — G4733 Obstructive sleep apnea (adult) (pediatric): Secondary | ICD-10-CM

## 2013-12-18 DIAGNOSIS — J45909 Unspecified asthma, uncomplicated: Secondary | ICD-10-CM

## 2013-12-18 DIAGNOSIS — J45901 Unspecified asthma with (acute) exacerbation: Secondary | ICD-10-CM

## 2013-12-18 IMAGING — CR DG CHEST 2V
2 series · 2 of 2 positions shown · non-contrast
Comparison: [DATE].

CLINICAL DATA: Pneumonia.  Cough.

EXAM:
CHEST  2 VIEW

[view not recorded (1 of 2)]
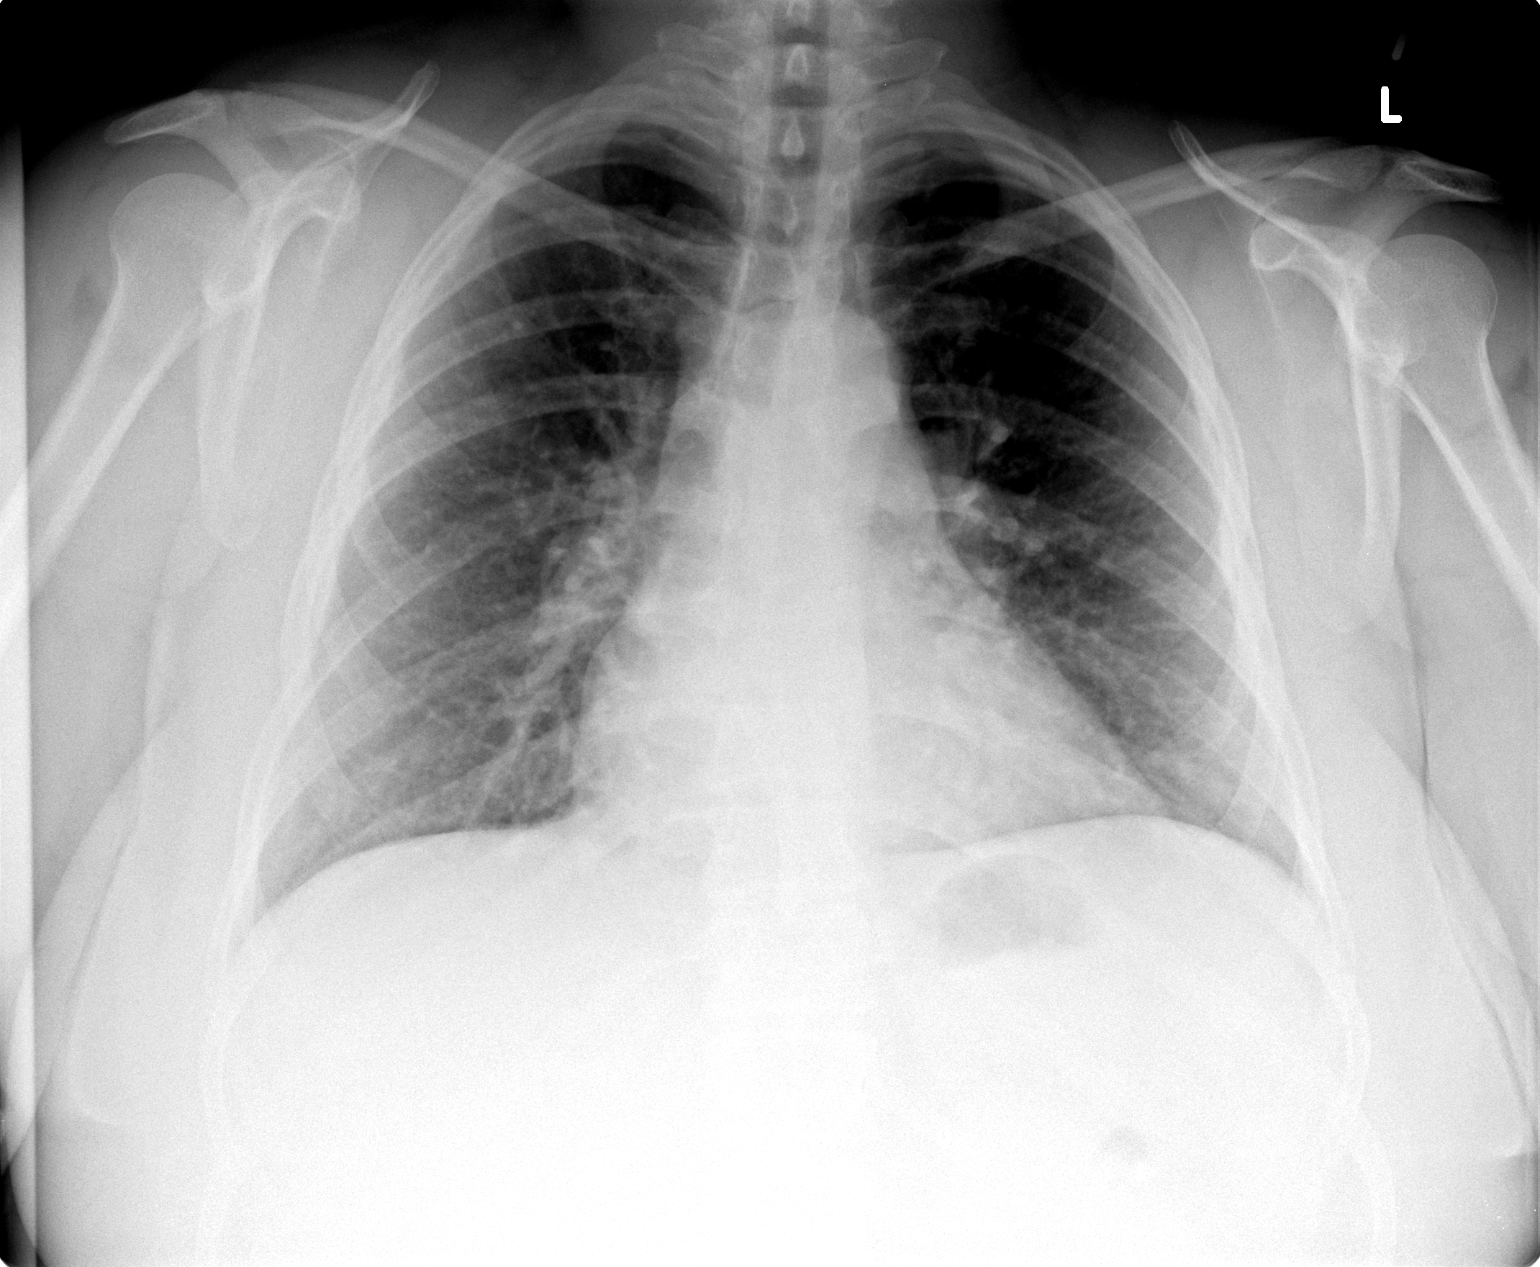

[view not recorded (2 of 2)]
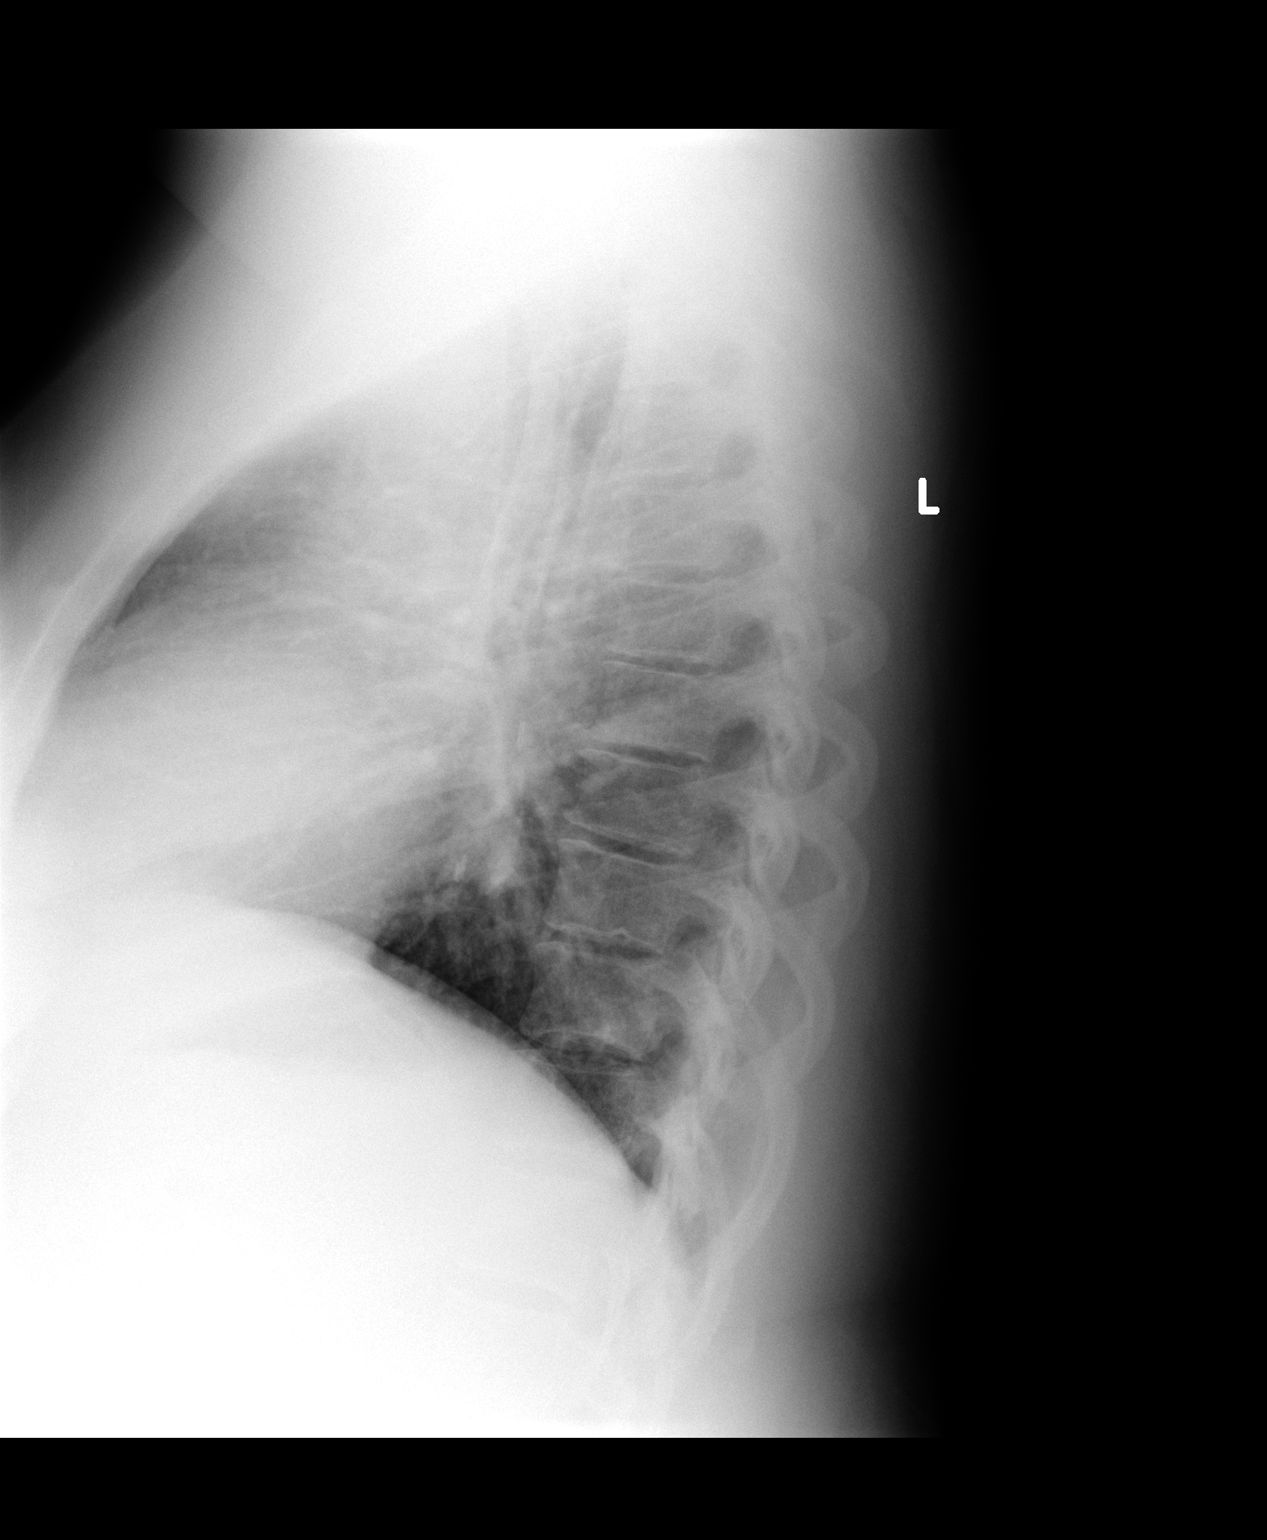

[2 of 2 positions shown; findings below may reference images not displayed]

FINDINGS: The cardiac silhouette, mediastinal and hilar contours are within
normal limits and stable. The lungs are clear. No pleural effusion.
The bony thorax is intact.
IMPRESSION: No acute cardiopulmonary findings.

## 2013-12-18 NOTE — Patient Instructions (Signed)
Continue on symbicort twice a day everyday for your asthma Stay on cpap, and try and increase your usage to at least 6hrs a day. Work on weight loss and conditioning. followup with me again in 43mos.

## 2013-12-18 NOTE — Assessment & Plan Note (Signed)
>>  ASSESSMENT AND PLAN FOR INTRINSIC ASTHMA WRITTEN ON 12/18/2013  3:08 PM BY CLANCE, Armando Reichert, MD  The patient appears to be stable from an asthma standpoint, and I've asked her to continue on her maintenance bronchodilator regimen.

## 2013-12-18 NOTE — Progress Notes (Signed)
   Subjective:    Patient ID: Denise Ray, female    DOB: [birth year], 43 y.o.   MRN: [id number]  HPI The patient comes in today for followup of her known asthma, as well as obstructive sleep apnea. She had a chest x-ray to followup her recent community acquired pneumonia, and this was totally clear. She has stayed on Symbicort compliantly, and has had no increasing symptoms. However, her dyspnea on exertion has not returned to baseline as of yet. I have reminded her that her shortness of breath is multifactorial, and currently does not appear to be related to her asthma. She has been wearing CPAP compliantly by her download, but is only wearing about 3-1/2 hours a night. She has no significant mask leak, and excellent control of her AHI.   Review of Systems  Constitutional: Negative for fever and unexpected weight change.  HENT: Negative for congestion, dental problem, ear pain, nosebleeds, postnasal drip, rhinorrhea, sinus pressure, sneezing, sore throat and trouble swallowing.   Eyes: Negative for redness and itching.  Respiratory: Negative for cough, chest tightness, shortness of breath and wheezing.   Cardiovascular: Negative for palpitations and leg swelling.  Gastrointestinal: Negative for nausea and vomiting.  Genitourinary: Negative for dysuria.  Musculoskeletal: Negative for joint swelling.  Skin: Negative for rash.  Neurological: Negative for headaches.  Hematological: Does not bruise/bleed easily.  Psychiatric/Behavioral: Negative for dysphoric mood. The patient is not nervous/anxious.        Objective:   Physical Exam Obese female in no acute distress Nose without purulence or discharge noted No skin breakdown or pressure necrosis from the CPAP mask Neck without lymphadenopathy or thyromegaly Chest totally clear to auscultation, no wheezing Cardiac exam with regular rate and rhythm Lower extremities with no significant edema, no cyanosis Alert and oriented, moves all 4  extremities.       Assessment & Plan:

## 2013-12-18 NOTE — Assessment & Plan Note (Signed)
The patient appears to be stable from an asthma standpoint, and I've asked her to continue on her maintenance bronchodilator regimen.

## 2013-12-18 NOTE — Assessment & Plan Note (Signed)
The patient is doing much better with her current CPAP device, and I've asked her to increase her time on the device each night is much as possible. I've also encouraged her to work aggressively on weight loss.

## 2014-01-08 ENCOUNTER — Emergency Department (HOSPITAL_COMMUNITY): Payer: 59

## 2014-01-08 ENCOUNTER — Emergency Department (HOSPITAL_COMMUNITY)
Admission: EM | Admit: 2014-01-08 | Discharge: 2014-01-08 | Disposition: A | Discharge status: Home / Self Care | Payer: 59 | Attending: Emergency Medicine | Admitting: Emergency Medicine

## 2014-01-08 ENCOUNTER — Encounter (HOSPITAL_COMMUNITY): Payer: Self-pay | Admitting: Emergency Medicine

## 2014-01-08 DIAGNOSIS — R0602 Shortness of breath: Secondary | ICD-10-CM | POA: Insufficient documentation

## 2014-01-08 DIAGNOSIS — Z87891 Personal history of nicotine dependence: Secondary | ICD-10-CM | POA: Insufficient documentation

## 2014-01-08 DIAGNOSIS — Z7982 Long term (current) use of aspirin: Secondary | ICD-10-CM | POA: Insufficient documentation

## 2014-01-08 DIAGNOSIS — IMO0002 Reserved for concepts with insufficient information to code with codable children: Secondary | ICD-10-CM | POA: Insufficient documentation

## 2014-01-08 DIAGNOSIS — J45901 Unspecified asthma with (acute) exacerbation: Secondary | ICD-10-CM | POA: Diagnosis not present

## 2014-01-08 DIAGNOSIS — Z792 Long term (current) use of antibiotics: Secondary | ICD-10-CM | POA: Insufficient documentation

## 2014-01-08 DIAGNOSIS — F329 Major depressive disorder, single episode, unspecified: Secondary | ICD-10-CM | POA: Insufficient documentation

## 2014-01-08 DIAGNOSIS — Z8701 Personal history of pneumonia (recurrent): Secondary | ICD-10-CM | POA: Diagnosis not present

## 2014-01-08 DIAGNOSIS — Z79899 Other long term (current) drug therapy: Secondary | ICD-10-CM | POA: Diagnosis not present

## 2014-01-08 DIAGNOSIS — F411 Generalized anxiety disorder: Secondary | ICD-10-CM | POA: Insufficient documentation

## 2014-01-08 DIAGNOSIS — Z8673 Personal history of transient ischemic attack (TIA), and cerebral infarction without residual deficits: Secondary | ICD-10-CM | POA: Insufficient documentation

## 2014-01-08 DIAGNOSIS — F3289 Other specified depressive episodes: Secondary | ICD-10-CM | POA: Diagnosis not present

## 2014-01-08 DIAGNOSIS — Z8541 Personal history of malignant neoplasm of cervix uteri: Secondary | ICD-10-CM | POA: Insufficient documentation

## 2014-01-08 DIAGNOSIS — K219 Gastro-esophageal reflux disease without esophagitis: Secondary | ICD-10-CM | POA: Insufficient documentation

## 2014-01-08 DIAGNOSIS — E039 Hypothyroidism, unspecified: Secondary | ICD-10-CM | POA: Insufficient documentation

## 2014-01-08 LAB — CBC
HEMATOCRIT: 41.7 % (ref 36.0–46.0)
Hemoglobin: 14.2 g/dL (ref 12.0–15.0)
MCH: 29.5 pg (ref 26.0–34.0)
MCHC: 34.1 g/dL (ref 30.0–36.0)
MCV: 86.5 fL (ref 78.0–100.0)
PLATELETS: 359 10*3/uL (ref 150–400)
RBC: 4.82 MIL/uL (ref 3.87–5.11)
RDW: 13.2 % (ref 11.5–15.5)
WBC: 9.9 10*3/uL (ref 4.0–10.5)

## 2014-01-08 LAB — BASIC METABOLIC PANEL
ANION GAP: 19 — AB (ref 5–15)
BUN: 5 mg/dL — ABNORMAL LOW (ref 6–23)
CALCIUM: 10 mg/dL (ref 8.4–10.5)
CO2: 22 mEq/L (ref 19–32)
Chloride: 99 mEq/L (ref 96–112)
Creatinine, Ser: 0.72 mg/dL (ref 0.50–1.10)
GFR calc Af Amer: >90 mL/min (ref >90)
GLUCOSE: 129 mg/dL — AB (ref 70–99)
Potassium: 3.9 mEq/L (ref 3.7–5.3)
SODIUM: 140 meq/L (ref 137–147)

## 2014-01-08 LAB — PRO B NATRIURETIC PEPTIDE: Pro B Natriuretic peptide (BNP): 20.2 pg/mL (ref 0–125)

## 2014-01-08 IMAGING — CR DG CHEST 2V
2 series · 2 of 2 positions shown · non-contrast
Comparison: [DATE]

CLINICAL DATA: Shortness of breath for 1 week

EXAM:
CHEST  2 VIEW

[w chest pa]
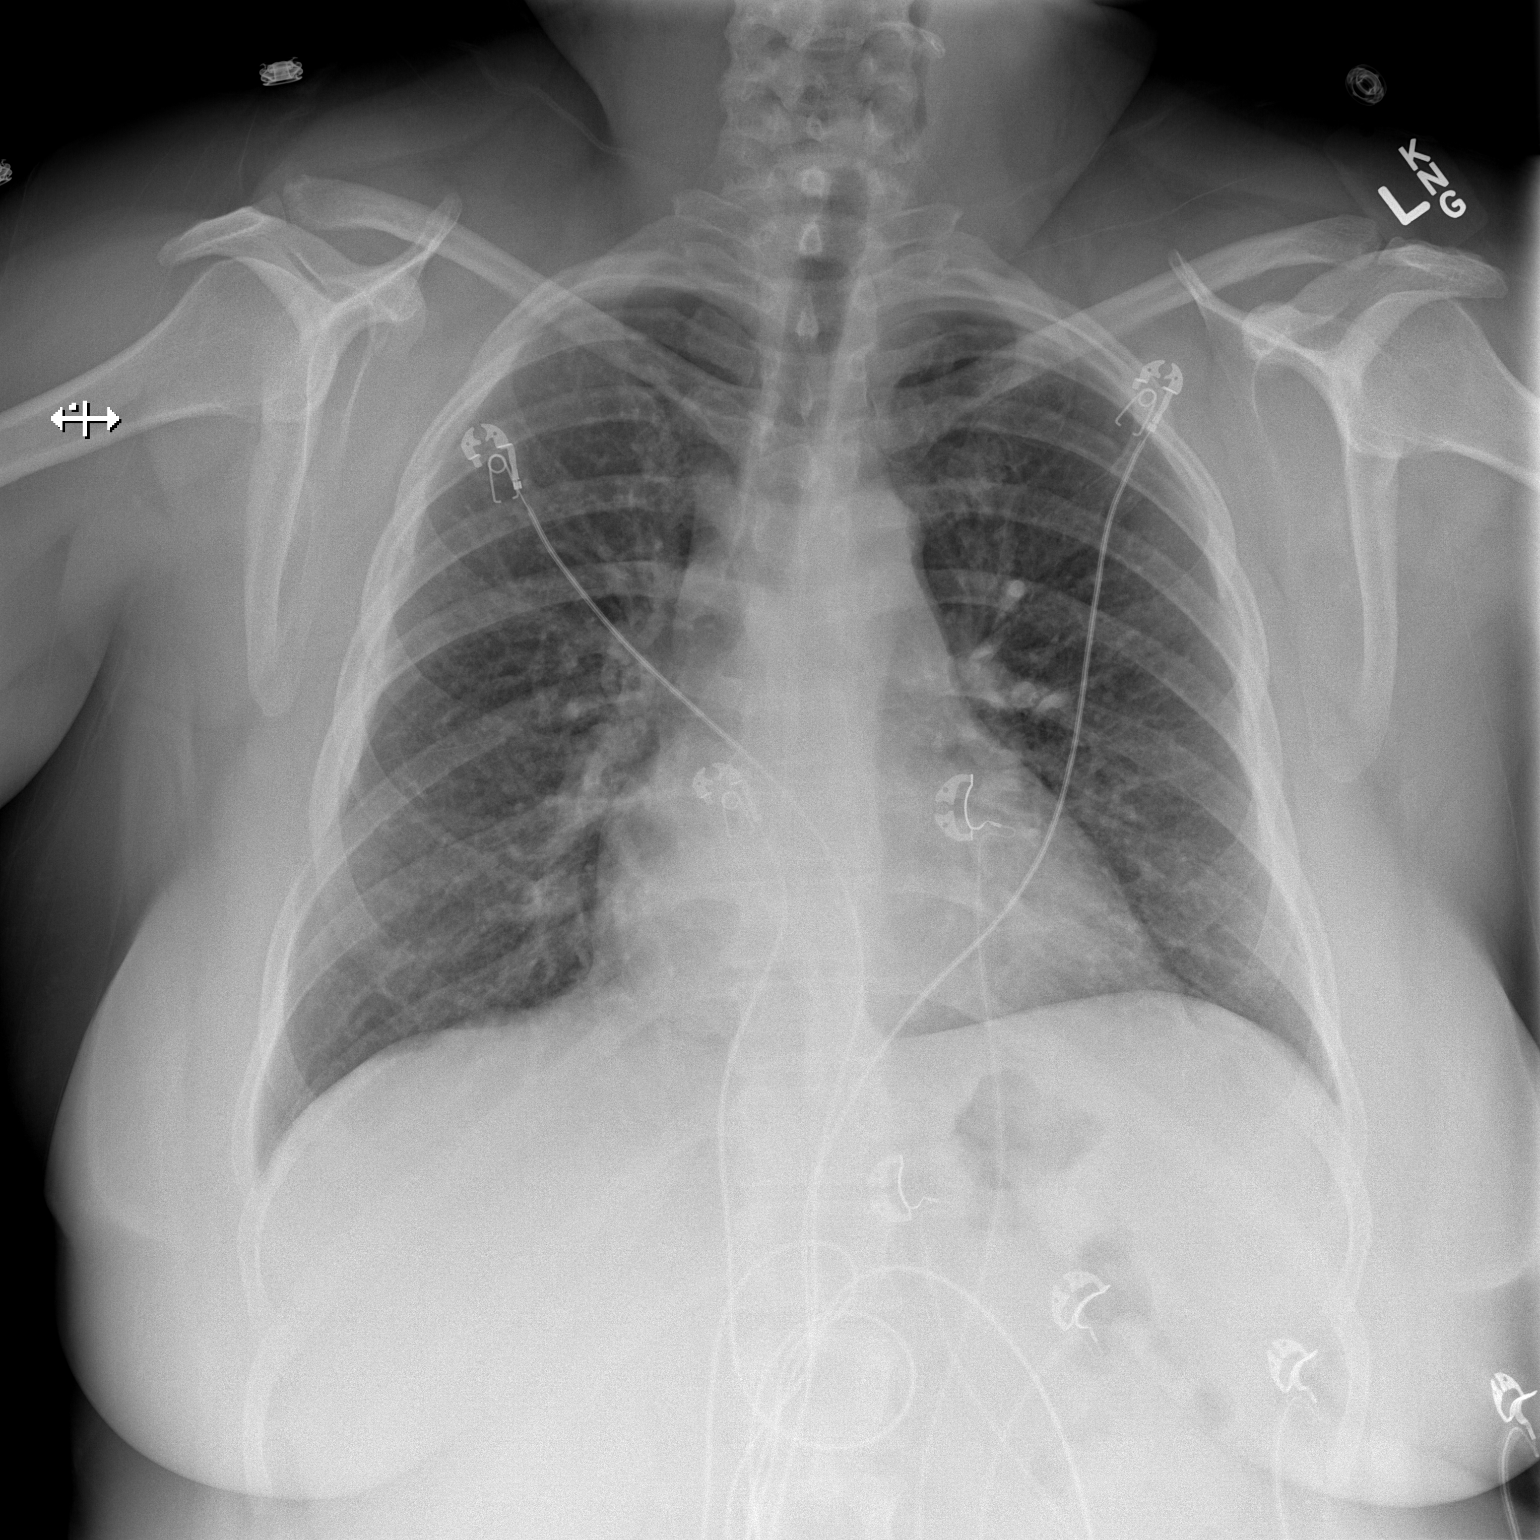

[w chest lat]
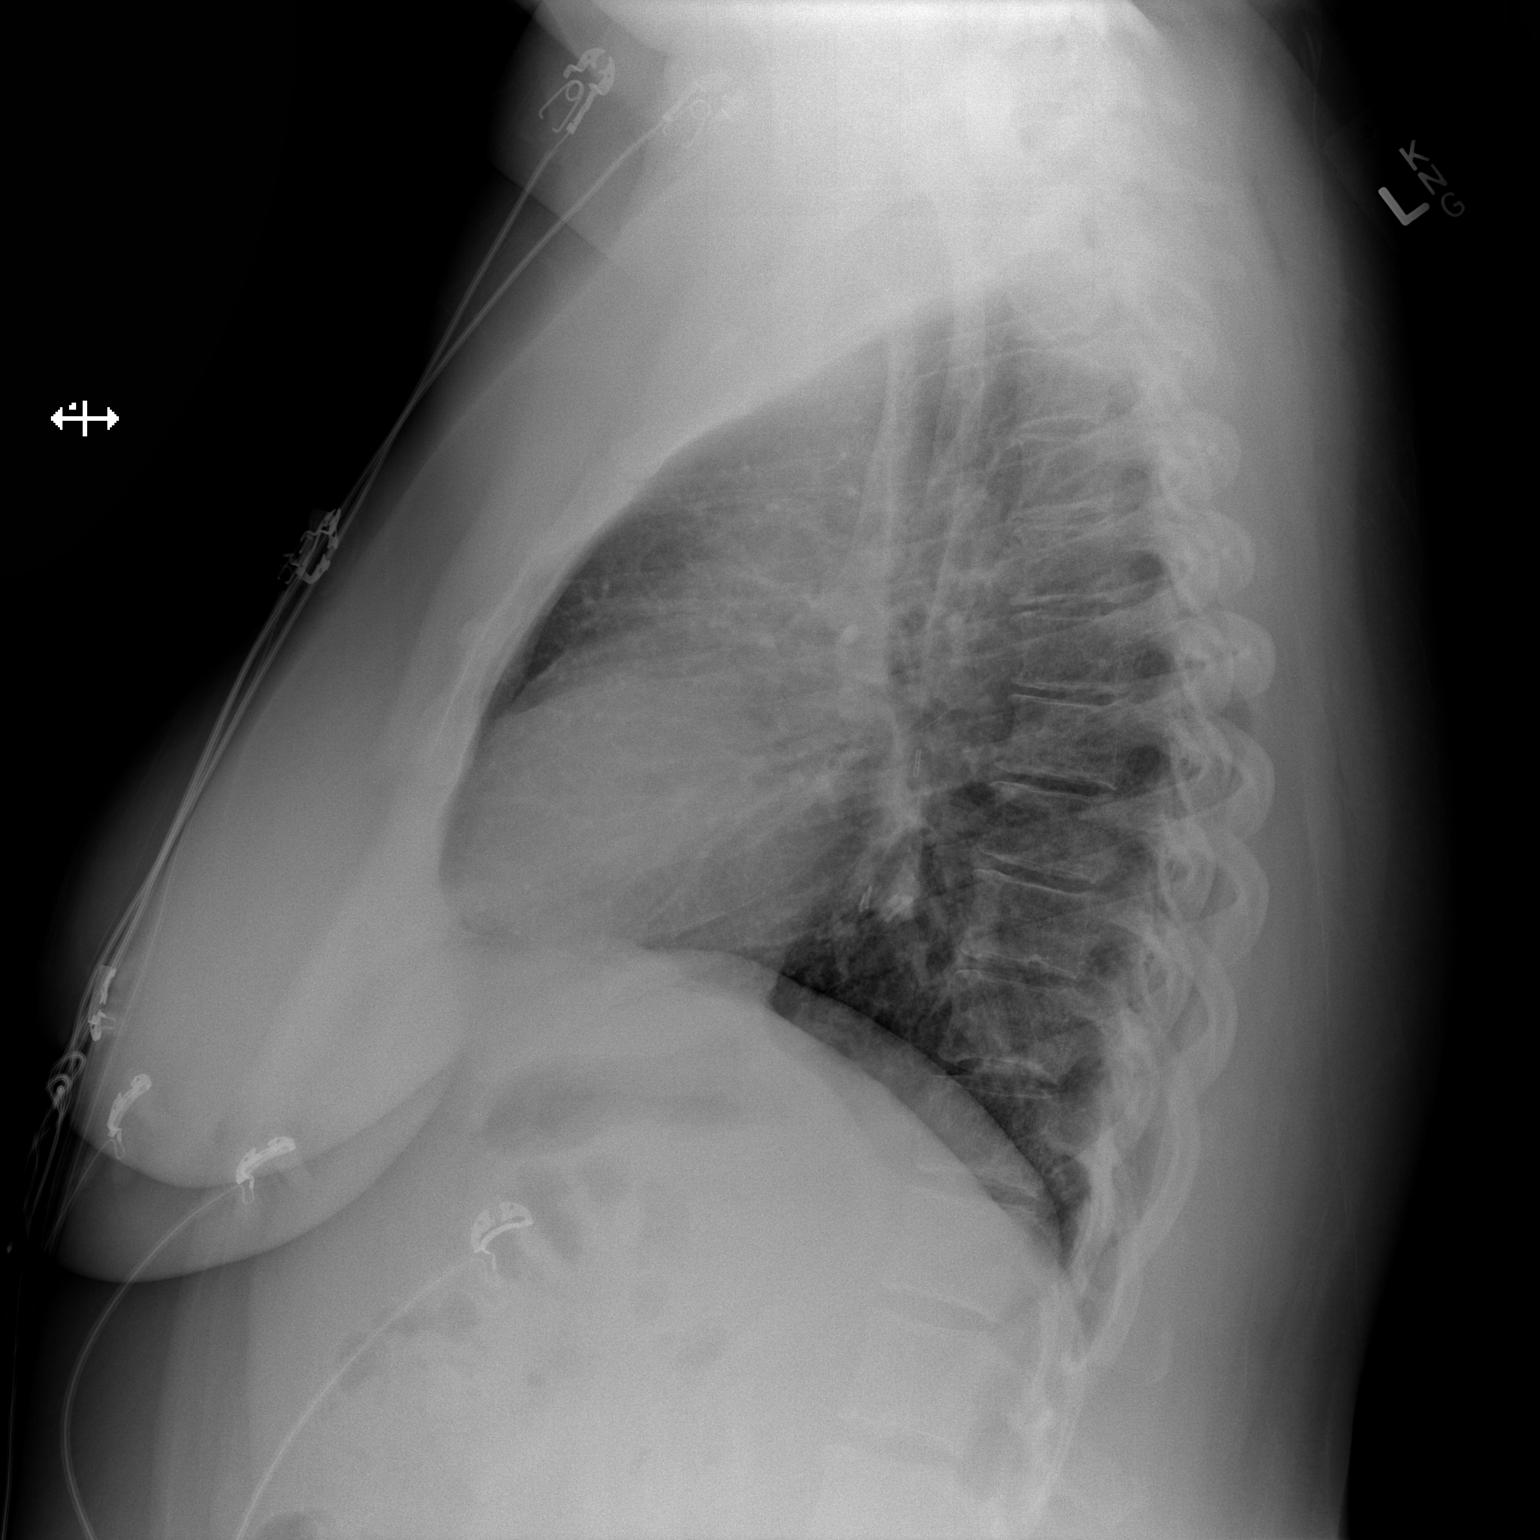

[2 of 2 positions shown; findings below may reference images not displayed]

FINDINGS: The heart size and mediastinal contours are within normal limits.
Both lungs are clear. The visualized skeletal structures are
unremarkable.
IMPRESSION: No active cardiopulmonary disease.

## 2014-01-08 MED ORDER — IPRATROPIUM BROMIDE 0.02 % IN SOLN
0.5000 mg | Freq: Once | RESPIRATORY_TRACT | Status: AC
  Administered 2014-01-08: 0.5 mg via RESPIRATORY_TRACT
  Filled 2014-01-08: qty 2.5

## 2014-01-08 MED ORDER — METHYLPREDNISOLONE SODIUM SUCC 125 MG IJ SOLR
125.0000 mg | Freq: Once | INTRAMUSCULAR | Status: AC
  Administered 2014-01-08: 125 mg via INTRAVENOUS
  Filled 2014-01-08: qty 2

## 2014-01-08 MED ORDER — PREDNISONE 20 MG PO TABS: 60.0000 mg | ORAL_TABLET | Freq: Every day | ORAL | Status: DC

## 2014-01-08 MED ORDER — ALBUTEROL (5 MG/ML) CONTINUOUS INHALATION SOLN
10.0000 mg/h | INHALATION_SOLUTION | Freq: Once | RESPIRATORY_TRACT | Status: AC
  Administered 2014-01-08: 10 mg/h via RESPIRATORY_TRACT
  Filled 2014-01-08: qty 20

## 2014-01-08 MED ORDER — IPRATROPIUM-ALBUTEROL 0.5-2.5 (3) MG/3ML IN SOLN
3.0000 mL | Freq: Once | RESPIRATORY_TRACT | Status: AC
  Administered 2014-01-08: 3 mL via RESPIRATORY_TRACT
  Filled 2014-01-08: qty 3

## 2014-01-08 NOTE — ED Provider Notes (Signed)
CSN: [id number]     Arrival date & time 01/08/14  1308 History   First MD Initiated Contact with Patient 01/08/14 1355     Chief Complaint  Patient presents with  . Shortness of Breath  . Wheezing    HPI Patient presents to the emergency room with a chief complaint of worsening shortness of breath. The patient has history of asthma, sleep apnea and pneumonia in the past. She was recently admitted to the hospital in June for pneumonia and in up requiring intubation. Patient noticed last week that she was having some trouble with nasal congestion and coughing.  She started having some pain in her right back area. She called her primary doctor because she was concerned about developing a recurrent pneumonia. She was given a prescription for azithromycin. Over the last couple of days her symptoms have progressed. She has become more short of breath. She has not noticed any trouble with leg swelling or fever. She has not had abdominal pain nausea or vomiting. Past Medical History  Diagnosis Date  . History of abnormal cervical Pap smear   . Hypothyroidism        . Asthma     seasonal- rare inhaler use  . Anxiety     no meds  . Depression     no meds  . TIA (transient ischemic attack)     from BCP  . GERD (gastroesophageal reflux disease)   . PTSD (post-traumatic stress disorder)   . History of attempted suicide   . Cervical cancer    Past Surgical History  Procedure Laterality Date  . Conization of cervix  2000  . Hysteroscopy w/d&c  4/09  . Chest wall/lung mass resection  7/03  . Y catheter    . Robotic assisted total hysterectomy N/A 10/11/2012    Procedure: ROBOTIC ASSISTED TOTAL HYSTERECTOMY;  Surgeon: Lyman Speller, MD;  Location: [facility];  Service: Gynecology;  Laterality: N/A;  . Salpingoophorectomy Bilateral 10/11/2012    Procedure: SALPINGO OOPHORECTOMY;  Surgeon: Lyman Speller, MD;  Location: [facility];  Service: Gynecology;  Laterality: Bilateral;  . Dilation and  curettage of uterus    . Hysteroscopy  08/29/2003  . Abdominal hysterectomy    . Video assisted thoracoscopy (vats)/thorocotomy     Family History  Problem Relation Age of Onset  . Breast cancer Brother 15  . Breast cancer Maternal Aunt   . Depression Mother   . Alcohol abuse Father   . Alcohol abuse Cousin    History  Substance Use Topics  . Smoking status: Former Smoker -- 2.00 packs/day for 25 years    Types: Cigarettes    Quit date: 10/26/2013  . Smokeless tobacco: Never Used  . Alcohol Use: No   OB History   Grav Para Term Preterm Abortions TAB SAB Ect Mult Living   1 1             Review of Systems  All other systems reviewed and are negative.     Allergies  Estrogens; Morphine and related; Tamiflu; and Wellbutrin  Home Medications   Prior to Admission medications   Medication Sig Start Date End Date Taking? Authorizing Provider  albuterol (PROVENTIL HFA;VENTOLIN HFA) 108 (90 BASE) MCG/ACT inhaler Inhale 2 puffs into the lungs every 6 (six) hours as needed. wheezing 01/06/13  Yes Lysbeth Penner, FNP  albuterol (PROVENTIL) (5 MG/ML) 0.5% nebulizer solution Take 2.5 mg by nebulization every 6 (six) hours as needed for wheezing or shortness of  breath.   Yes Historical Provider, MD  ALPRAZolam Duanne Moron) 1 MG tablet Take 1 mg by mouth 2 (two) times daily. 11/05/13  Yes Marijean Heath, NP  Alum & Mag Hydroxide-Simeth (MAGIC MOUTHWASH) SOLN Take 5 mLs by mouth every 6 (six) hours as needed for mouth pain. 11/05/13  Yes Marijean Heath, NP  aspirin EC 81 MG tablet Take 81 mg by mouth daily.   Yes Historical Provider, MD  azithromycin (ZITHROMAX Z-PAK) 250 MG tablet Take 250 mg by mouth See admin instructions. Takes 500mg  on day 1, then 250mg  on days 2 thru 4 01/04/14  Yes Historical Provider, MD  budesonide-formoterol (SYMBICORT) 160-4.5 MCG/ACT inhaler Inhale 2 puffs into the lungs 2 (two) times daily.   Yes Historical Provider, MD  Cholecalciferol (VITAMIN D-3)  1000 UNITS CAPS Take 1 capsule by mouth daily.   Yes Historical Provider, MD  DULoxetine (CYMBALTA) 30 MG capsule Take 30 mg by mouth daily.   Yes Historical Provider, MD  HYDROcodone-acetaminophen (NORCO/VICODIN) 5-325 MG per tablet Take 1 tablet by mouth every 6 (six) hours as needed for moderate pain.   Yes Historical Provider, MD  levothyroxine (SYNTHROID, LEVOTHROID) 50 MCG tablet Take 50 mcg by mouth daily before breakfast.   Yes Historical Provider, MD  Omega-3 Fatty Acids (FISH OIL) 1000 MG CAPS Take 3 capsules by mouth daily.   Yes Historical Provider, MD  pantoprazole (PROTONIX) 40 MG tablet Take 40 mg by mouth daily.   Yes Historical Provider, MD  predniSONE (DELTASONE) 20 MG tablet Take 3 tablets (60 mg total) by mouth daily. 01/08/14   Dorie Rank, MD   BP 141/94  Pulse 82  Temp(Src) 97.7 F (36.5 C) (Oral)  Resp 22  SpO2 100% Physical Exam  Nursing note and vitals reviewed. Constitutional: She appears well-developed and well-nourished. No distress.  Obese  HENT:  Head: Normocephalic and atraumatic.  Right Ear: External ear normal.  Left Ear: External ear normal.  Eyes: Conjunctivae are normal. Right eye exhibits no discharge. Left eye exhibits no discharge. No scleral icterus.  Neck: Neck supple. No tracheal deviation present.  Cardiovascular: Normal rate, regular rhythm and intact distal pulses.   Pulmonary/Chest: No stridor. Tachypnea noted. No respiratory distress. She has wheezes. She has no rales.  Prolonged expirations  Abdominal: Soft. Bowel sounds are normal. She exhibits no distension. There is no tenderness. There is no rebound and no guarding.  Musculoskeletal: She exhibits no edema and no tenderness.  Neurological: She is alert. She has normal strength. No cranial nerve deficit (no facial droop, extraocular movements intact, no slurred speech) or sensory deficit. She exhibits normal muscle tone. She displays no seizure activity. Coordination normal.  Skin: Skin is  warm and dry. No rash noted.  Psychiatric: She has a normal mood and affect.    ED Course  Procedures (including critical care time) Labs Review Labs Reviewed  BASIC METABOLIC PANEL - Abnormal; Notable for the following:    Glucose, Bld 129 (*)    BUN 5 (*)    Anion gap 19 (*)    All other components within normal limits  CBC  PRO B NATRIURETIC PEPTIDE    Imaging Review Dg Chest 2 View (if Patient Has Fever And/or Copd)  01/08/2014   CLINICAL DATA:  Shortness of breath for 1 week  EXAM: CHEST  2 VIEW  COMPARISON:  12/18/2013  FINDINGS: The heart size and mediastinal contours are within normal limits. Both lungs are clear. The visualized skeletal structures are unremarkable.  IMPRESSION: No active cardiopulmonary disease.   Electronically Signed   By: Kathreen Devoid   On: 01/08/2014 14:06     EKG Interpretation   Date/Time:  Monday January 08 2014 13:19:15 EDT Ventricular Rate:  83 PR Interval:  129 QRS Duration: 88 QT Interval:  388 QTC Calculation: 456 R Axis:   169 Text Interpretation:  Right and left arm electrode reversal,  interpretation assumes no reversal Sinus rhythm Right axis deviation Low  voltage, precordial leads Abnormal T, consider ischemia, lateral leads  Confirmed by Wilson Singer  MD, STEPHEN ([id number]) on 01/08/2014 1:25:52 PM     1536  Repeat exam. Lungs CTA MDM   Final diagnoses:  Asthma exacerbation    Chest x-ray does not show evidence of pneumonia or pulmonary edema. Laboratory values are unremarkable.  I believe the symptoms are related to an asthma exacerbation. She has responded to breathing treatments and steroids. Comfortable with going home at this point. The plan is for discharge to home with a prescription for prednisone. Followup with Dr. Luan Pulling later this week to make sure she is improving    Dorie Rank, MD 01/08/14 1538

## 2014-01-08 NOTE — Discharge Instructions (Signed)

## 2014-01-08 NOTE — ED Notes (Signed)
Pt states that for about a week she has been having shob, wheezing and pain at right lung base that is similar to when she was intubated back in June for PNA.  Pt states she finished her zpak this morning.

## 2014-01-09 ENCOUNTER — Ambulatory Visit: Payer: Self-pay | Admitting: Obstetrics & Gynecology

## 2014-03-19 ENCOUNTER — Encounter (HOSPITAL_COMMUNITY): Payer: Self-pay | Admitting: Emergency Medicine

## 2014-04-09 ENCOUNTER — Encounter: Payer: Self-pay | Admitting: Obstetrics & Gynecology

## 2014-04-09 ENCOUNTER — Ambulatory Visit (INDEPENDENT_AMBULATORY_CARE_PROVIDER_SITE_OTHER): Payer: 59 | Admitting: Obstetrics & Gynecology

## 2014-04-09 VITALS — BP 124/66 | HR 64 | Ht 64.75 in | Wt 248.0 lb

## 2014-04-09 DIAGNOSIS — Z1239 Encounter for other screening for malignant neoplasm of breast: Secondary | ICD-10-CM

## 2014-04-09 DIAGNOSIS — Z124 Encounter for screening for malignant neoplasm of cervix: Secondary | ICD-10-CM

## 2014-04-09 DIAGNOSIS — Z01419 Encounter for gynecological examination (general) (routine) without abnormal findings: Secondary | ICD-10-CM

## 2014-04-09 MED ORDER — NYSTATIN 100000 UNIT/GM EX CREA: 1.0000 "application " | TOPICAL_CREAM | Freq: Two times a day (BID) | CUTANEOUS | Status: DC

## 2014-04-09 MED ORDER — LIDOCAINE 5 % EX OINT: 1.0000 "application " | TOPICAL_OINTMENT | Freq: Four times a day (QID) | CUTANEOUS | Status: DC | PRN

## 2014-04-09 NOTE — Progress Notes (Signed)
Patient ID: Denise Ray, female   DOB: 1970-12-17, 43 y.o.   MRN: [id number]   43 y.o. G1P1 DivorcedCaucasianF here for annual exam.  Hospitalized in the summer twice for atypical pneumonia and asthma exacerbations.  Was intubated two days and then on Bipap.  Stopped taking her maintenance medications.  Pt feels like she has thrush all the time with it.    Has seen Dr. Harrington Challenger this year.  Was on Cymbalta.  Felt it caused rectal bleeding.  She stopped it and the rectal bleeding stopped.  Has seen Dr. Collene Mares in the past.  D/W pt going for colonoscopy.  She declines for now but promises to call me if it happens again.  She really feels this was related to the Cymbalta.  No vaginal bleeding.    No LMP recorded. Patient has had a hysterectomy.          Sexually active: Yes.    The current method of family planning is status post hysterectomy.    Exercising: No.  The patient does not participate in regular exercise at present. Smoker:  no  Health Maintenance: Pap:  03/02/12, negative with neg HR HPV History of abnormal Pap:  yes MMG:  03/20/11, Bi-Rads 1:  Negative  TDaP:  UTD, [employer] employee Screening Labs: 12/2013, multiple labs since hospitalization in June   reports that she quit smoking about 5 months ago. Her smoking use included Cigarettes. She has a 50 pack-year smoking history. She has never used smokeless tobacco. She reports that she does not drink alcohol or use illicit drugs.  Past Medical History  Diagnosis Date  . History of abnormal cervical Pap smear   . Hypothyroidism        . Asthma     seasonal- rare inhaler use  . Anxiety     no meds  . Depression     no meds  . TIA (transient ischemic attack)     from BCP  . GERD (gastroesophageal reflux disease)   . PTSD (post-traumatic stress disorder)   . History of attempted suicide   . Cervical cancer     Past Surgical History  Procedure Laterality Date  . Conization of cervix  2000  . Hysteroscopy w/d&c  4/09  . Chest  wall/lung mass resection  7/03  . Y catheter    . Robotic assisted total hysterectomy N/A 10/11/2012    Procedure: ROBOTIC ASSISTED TOTAL HYSTERECTOMY;  Surgeon: Lyman Speller, MD;  Location: [facility];  Service: Gynecology;  Laterality: N/A;  . Salpingoophorectomy Bilateral 10/11/2012    Procedure: SALPINGO OOPHORECTOMY;  Surgeon: Lyman Speller, MD;  Location: [facility];  Service: Gynecology;  Laterality: Bilateral;  . Dilation and curettage of uterus    . Hysteroscopy  08/29/2003  . Abdominal hysterectomy    . Video assisted thoracoscopy (vats)/thorocotomy      Current Outpatient Prescriptions  Medication Sig Dispense Refill  . albuterol (PROVENTIL HFA;VENTOLIN HFA) 108 (90 BASE) MCG/ACT inhaler Inhale 2 puffs into the lungs every 6 (six) hours as needed. wheezing 1 Inhaler 11  . albuterol (PROVENTIL) (5 MG/ML) 0.5% nebulizer solution Take 2.5 mg by nebulization every 6 (six) hours as needed for wheezing or shortness of breath.    . ALPRAZolam (XANAX) 1 MG tablet Take 1 mg by mouth 2 (two) times daily.    . Alum & Mag Hydroxide-Simeth (MAGIC MOUTHWASH) SOLN Take 5 mLs by mouth every 6 (six) hours as needed for mouth pain. 100 mL 0  .  aspirin EC 81 MG tablet Take 81 mg by mouth daily.    . budesonide-formoterol (SYMBICORT) 160-4.5 MCG/ACT inhaler Inhale 2 puffs into the lungs 2 (two) times daily.    . DULoxetine (CYMBALTA) 30 MG capsule Take 30 mg by mouth daily.    [name] HYDROcodone-acetaminophen (NORCO/VICODIN) 5-325 MG per tablet Take 1 tablet by mouth every 6 (six) hours as needed for moderate pain.    [name] levothyroxine (SYNTHROID, LEVOTHROID) 50 MCG tablet Take 50 mcg by mouth daily before breakfast.    . Omega-3 Fatty Acids (FISH OIL) 1000 MG CAPS Take 3 capsules by mouth daily.    . pantoprazole (PROTONIX) 40 MG tablet Take 40 mg by mouth daily.    . predniSONE (DELTASONE) 20 MG tablet Take 3 tablets (60 mg total) by mouth daily. (Patient not taking: Reported on 04/09/2014) 15 tablet 0    No current facility-administered medications for this visit.    Family History  Problem Relation Age of Onset  . Breast cancer Brother 15  . Breast cancer Maternal Aunt   . Depression Mother   . Alcohol abuse Father   . Alcohol abuse Cousin     ROS:  Pertinent items are noted in HPI.  Otherwise, a comprehensive ROS was negative.  Exam:   BP 124/66 mmHg  Pulse 64  Ht 5' 4.75" (1.645 m)  Wt 248 lb (112.492 kg)  BMI 41.57 kg/m2  Weight change: @WEIGHTCHANGE @ Height:   Height: 5' 4.75" (164.5 cm)  Ht Readings from Last 3 Encounters:  04/09/14 5' 4.75" (1.645 m)  12/18/13 5\' 4"  (1.626 m)  11/10/13 5\' 4"  (1.626 m)    General appearance: alert, cooperative and appears stated age Head: Normocephalic, without obvious abnormality, atraumatic Neck: no adenopathy, supple, symmetrical, trachea midline and thyroid normal to inspection and palpation Lungs: clear to auscultation bilaterally Breasts: normal appearance, no masses or tenderness Heart: regular rate and rhythm Abdomen: soft, non-tender; bowel sounds normal; no masses,  no organomegaly Extremities: extremities normal, atraumatic, no cyanosis or edema Skin: Skin color, texture, turgor normal. No rashes or lesions Lymph nodes: Cervical, supraclavicular, and axillary nodes normal. No abnormal inguinal nodes palpated Neurologic: Grossly normal   Pelvic: External genitalia:  Erythema along inner thigh creases c/w yeast              Urethra:  normal appearing urethra with no masses, tenderness or lesions              Bartholins and Skenes: normal                 Vagina: normal appearing vagina with normal color and discharge, no lesions              Cervix: absent              Pap taken: Yes.   Bimanual Exam:  Uterus:  uterus absent              Adnexa: no mass, fullness, tenderness               Rectovaginal: Confirms               Anus:  normal sphincter tone, no lesions  A:  Well Woman with normal exam H/O Robotic  TLH/bilateral salpingectomy 5/14 with h/o abnormal paps and LEEP hx Rectal bleeding.  Feel should consider colonoscopy but will refer back to Dr. Collene Mares. Vulvar skin yeast Asthma.  Non compliant with medications Smoking again Hypothyroidism.  Not taking medication regularly.  Brother with breast cancer.  Neg genetic testing in pt.  Aware should do yearly MMG.  Pt hasn't gone yet.    P:   Mammogram scheduled for pt Referral back to Dr. Collene Mares Nystatin cream bid up to 7 days for symptoms of vulvar yeast pap smear obtained return annually or prn  An After Visit Summary was printed and given to the patient.

## 2014-04-10 LAB — IPS PAP TEST WITH REFLEX TO HPV

## 2014-04-11 ENCOUNTER — Telehealth: Payer: Self-pay

## 2014-04-11 MED ORDER — METRONIDAZOLE 500 MG PO TABS: 500.0000 mg | ORAL_TABLET | Freq: Two times a day (BID) | ORAL | Status: DC

## 2014-04-11 NOTE — Telephone Encounter (Signed)
Patient notified of results. States is having some itching and would like the flagyl called to [facility]. Will call back if this does not help. Rx sent through epic. Patient does not have AEX scheduled for next year, in 02 recall after 04/10/15//kn

## 2014-04-11 NOTE — Telephone Encounter (Signed)
-----   Message from Lyman Speller, MD sent at 04/11/2014  5:34 AM EST ----- 02 recall.  Please inform pap normal.  BV present.  If symptomatic, can treat with Metrogel 0.75% qhs x 5 days or flagyl 500mg  bid x 7 day.  I can do RX if pt desires.  Thanks.

## 2014-04-18 ENCOUNTER — Telehealth: Payer: Self-pay | Admitting: Emergency Medicine

## 2014-04-18 NOTE — Telephone Encounter (Signed)
Patient scheduled for 04/26/14 at 10:00 with Dr. Collene Mares for evaluation of rectal bleeding.  Patient notified and agreeable.  Routing to provider for final review. Patient agreeable to disposition. Will close encounter

## 2014-04-18 NOTE — Telephone Encounter (Signed)
-----   Message from Lyman Speller, MD sent at 04/17/2014 12:43 PM EST ----- Regarding: referral to Dr. Collene Mares Pt having rectal bleeding.  i think needs to see dr. Collene Mares again.  Has been seen in the past.  Can you see if her office will call pt or do we need to do referral again?  Thanks.  MSM

## 2014-05-03 ENCOUNTER — Ambulatory Visit
Admission: RE | Admit: 2014-05-03 | Discharge: 2014-05-03 | Disposition: A | Discharge status: Home / Self Care | Payer: 59 | Source: Ambulatory Visit | Attending: Obstetrics & Gynecology | Admitting: Obstetrics & Gynecology

## 2014-05-03 DIAGNOSIS — Z1239 Encounter for other screening for malignant neoplasm of breast: Secondary | ICD-10-CM

## 2014-05-07 ENCOUNTER — Other Ambulatory Visit: Payer: Self-pay | Admitting: Obstetrics & Gynecology

## 2014-05-07 DIAGNOSIS — R928 Other abnormal and inconclusive findings on diagnostic imaging of breast: Secondary | ICD-10-CM

## 2014-05-15 ENCOUNTER — Ambulatory Visit (INDEPENDENT_AMBULATORY_CARE_PROVIDER_SITE_OTHER): Payer: Self-pay | Admitting: General Surgery

## 2014-05-18 DIAGNOSIS — K649 Unspecified hemorrhoids: Secondary | ICD-10-CM

## 2014-05-18 HISTORY — DX: Unspecified hemorrhoids: K64.9

## 2014-05-22 ENCOUNTER — Ambulatory Visit
Admission: RE | Admit: 2014-05-22 | Discharge: 2014-05-22 | Disposition: A | Discharge status: Home / Self Care | Payer: 59 | Source: Ambulatory Visit | Attending: Obstetrics & Gynecology | Admitting: Obstetrics & Gynecology

## 2014-05-22 DIAGNOSIS — R928 Other abnormal and inconclusive findings on diagnostic imaging of breast: Secondary | ICD-10-CM

## 2014-06-12 ENCOUNTER — Encounter (HOSPITAL_BASED_OUTPATIENT_CLINIC_OR_DEPARTMENT_OTHER): Payer: Self-pay | Admitting: *Deleted

## 2014-06-12 ENCOUNTER — Encounter (HOSPITAL_BASED_OUTPATIENT_CLINIC_OR_DEPARTMENT_OTHER)
Admission: RE | Admit: 2014-06-12 | Discharge: 2014-06-12 | Disposition: A | Discharge status: Home / Self Care | Payer: 59 | Source: Ambulatory Visit | Attending: General Surgery | Admitting: General Surgery

## 2014-06-12 DIAGNOSIS — F17210 Nicotine dependence, cigarettes, uncomplicated: Secondary | ICD-10-CM | POA: Diagnosis not present

## 2014-06-12 DIAGNOSIS — K648 Other hemorrhoids: Secondary | ICD-10-CM | POA: Diagnosis not present

## 2014-06-12 DIAGNOSIS — K219 Gastro-esophageal reflux disease without esophagitis: Secondary | ICD-10-CM | POA: Diagnosis not present

## 2014-06-12 DIAGNOSIS — Z90710 Acquired absence of both cervix and uterus: Secondary | ICD-10-CM | POA: Diagnosis not present

## 2014-06-12 DIAGNOSIS — F419 Anxiety disorder, unspecified: Secondary | ICD-10-CM | POA: Diagnosis not present

## 2014-06-12 DIAGNOSIS — Z6841 Body Mass Index (BMI) 40.0 and over, adult: Secondary | ICD-10-CM | POA: Diagnosis not present

## 2014-06-12 DIAGNOSIS — J45909 Unspecified asthma, uncomplicated: Secondary | ICD-10-CM | POA: Diagnosis not present

## 2014-06-12 DIAGNOSIS — G43909 Migraine, unspecified, not intractable, without status migrainosus: Secondary | ICD-10-CM | POA: Diagnosis not present

## 2014-06-12 DIAGNOSIS — L918 Other hypertrophic disorders of the skin: Secondary | ICD-10-CM | POA: Diagnosis not present

## 2014-06-12 DIAGNOSIS — Z79899 Other long term (current) drug therapy: Secondary | ICD-10-CM | POA: Diagnosis not present

## 2014-06-12 DIAGNOSIS — G4730 Sleep apnea, unspecified: Secondary | ICD-10-CM | POA: Diagnosis not present

## 2014-06-12 DIAGNOSIS — Z7951 Long term (current) use of inhaled steroids: Secondary | ICD-10-CM | POA: Diagnosis not present

## 2014-06-12 DIAGNOSIS — E780 Pure hypercholesterolemia: Secondary | ICD-10-CM | POA: Diagnosis not present

## 2014-06-12 DIAGNOSIS — E039 Hypothyroidism, unspecified: Secondary | ICD-10-CM | POA: Diagnosis not present

## 2014-06-12 DIAGNOSIS — Z8541 Personal history of malignant neoplasm of cervix uteri: Secondary | ICD-10-CM | POA: Diagnosis not present

## 2014-06-12 DIAGNOSIS — F329 Major depressive disorder, single episode, unspecified: Secondary | ICD-10-CM | POA: Diagnosis not present

## 2014-06-12 DIAGNOSIS — Z8673 Personal history of transient ischemic attack (TIA), and cerebral infarction without residual deficits: Secondary | ICD-10-CM | POA: Diagnosis not present

## 2014-06-12 DIAGNOSIS — M1990 Unspecified osteoarthritis, unspecified site: Secondary | ICD-10-CM | POA: Diagnosis not present

## 2014-06-12 LAB — BASIC METABOLIC PANEL
Anion gap: 8 (ref 5–15)
CHLORIDE: 105 mmol/L (ref 96–112)
CO2: 26 mmol/L (ref 19–32)
Calcium: 8.8 mg/dL (ref 8.4–10.5)
Creatinine, Ser: 0.69 mg/dL (ref 0.50–1.10)
GFR calc Af Amer: >90 mL/min (ref >90)
Glucose, Bld: 107 mg/dL — ABNORMAL HIGH (ref 70–99)
POTASSIUM: 3.5 mmol/L (ref 3.5–5.1)
Sodium: 139 mmol/L (ref 135–145)

## 2014-06-12 LAB — CBC WITH DIFFERENTIAL/PLATELET
BASOS ABS: 0 10*3/uL (ref 0.0–0.1)
BASOS PCT: 0 % (ref 0–1)
Eosinophils Absolute: 0.2 10*3/uL (ref 0.0–0.7)
Eosinophils Relative: 2 % (ref 0–5)
HEMATOCRIT: 42.9 % (ref 36.0–46.0)
Hemoglobin: 14.1 g/dL (ref 12.0–15.0)
LYMPHS ABS: 2.5 10*3/uL (ref 0.7–4.0)
LYMPHS PCT: 35 % (ref 12–46)
MCH: 28.7 pg (ref 26.0–34.0)
MCHC: 32.9 g/dL (ref 30.0–36.0)
MCV: 87.4 fL (ref 78.0–100.0)
Monocytes Absolute: 0.7 10*3/uL (ref 0.1–1.0)
Monocytes Relative: 10 % (ref 3–12)
Neutro Abs: 3.8 10*3/uL (ref 1.7–7.7)
Neutrophils Relative %: 53 % (ref 43–77)
PLATELETS: 309 10*3/uL (ref 150–400)
RBC: 4.91 MIL/uL (ref 3.87–5.11)
RDW: 13.5 % (ref 11.5–15.5)
WBC: 7.2 10*3/uL (ref 4.0–10.5)

## 2014-06-12 NOTE — Pre-Procedure Instructions (Signed)
To come for BMET, CBC, diff and anesthesia airway evaluation.

## 2014-06-14 NOTE — H&P (Signed)
Denise Ray 05/15/2014 8:34 AM Location: [facility] Patient #: [id number] DOB: 1971/04/19 Divorced / Language: [facility]ise [facility] / Race: White Female  History of Present Illness Denise Ray Denise Ray; 05/15/2014 8:35 AM) Patient words: condyloma.  The patient is a 44 year old female    Other Problems Denise Ray, Denise Ray; 05/15/2014 8:35 AM) Anxiety Disorder Asthma Cerebrovascular Accident Cervical Cancer Depression Gastroesophageal Reflux Disease Hypercholesterolemia Migraine Headache Other disease, cancer, significant illness Sleep Apnea Thyroid Disease  Past Surgical History Denise Ray, Denise Ray; 05/15/2014 8:35 AM) Cesarean Section - 1 Hysterectomy (due to cancer) - Partial Hysterectomy (not due to cancer) - Partial Lung Surgery Right.  Diagnostic Studies History Denise Ray, Denise Ray; 05/15/2014 8:35 AM) Colonoscopy never Pap Smear 1-5 years ago  Allergies Denise Ray, Denise Ray; 05/15/2014 8:36 AM) Morphine Derivatives  Medication History Denise Ray, Denise Ray; 05/15/2014 8:36 AM) Hydrocodone-Acetaminophen (5-325MG  Tablet, Oral) Active. ALPRAZolam (1MG  Tablet, Oral) Active. MethylPREDNISolone (Pak) (4MG  Tablet, Oral) Active. Levothyroxine Sodium (50MCG Tablet, Oral) Active. Pantoprazole Sodium (40MG  Tablet DR, Oral) Active. PredniSONE (20MG  Tablet, Oral) Active.  Social History Denise Ray, [place]; 05/15/2014 8:35 AM) Alcohol use Remotely quit alcohol use. Caffeine use Carbonated beverages. Tobacco use Current some day smoker.  Family History Denise Ray, [place]; 05/15/2014 8:35 AM) Alcohol Abuse Family Members In General, Father. Arthritis Mother. Breast Cancer Brother, Family Members In General. Colon Cancer Family Members In General. Colon Polyps Father. Depression Mother. Diabetes Mellitus Family Members In General, Father. Heart Disease Family Members In General, Mother. Heart disease in female family  member before age 16 Hypertension Family Members In General, Father, Mother. Migraine Headache Brother. Respiratory Condition Family Members In General, Mother.  Pregnancy / Birth History Denise Ray, Denise Ray; 05/15/2014 8:35 AM) Age at menarche 74 years. Contraceptive History Intrauterine device, Oral contraceptives. Gravida 1 Irregular periods Maternal age 23-30 Para 1  Review of Systems Denise Ray Denise Ray; 05/15/2014 8:35 AM) General Present- Fatigue. Not Present- Appetite Loss, Chills, Fever, Night Sweats, Weight Gain and Weight Loss. Skin Not Present- Change in Wart/Mole, Dryness, Hives, Jaundice, New Lesions, Non-Healing Wounds, Rash and Ulcer. HEENT Not Present- Earache, Hearing Loss, Hoarseness, Nose Bleed, Oral Ulcers, Ringing in the Ears, Seasonal Allergies, Sinus Pain, Sore Throat, Visual Disturbances, Wears glasses/contact lenses and Yellow Eyes. Respiratory Not Present- Bloody sputum, Chronic Cough, Difficulty Breathing, Snoring and Wheezing. Cardiovascular Present- Shortness of Breath. Not Present- Chest Pain, Difficulty Breathing Lying Down, Leg Cramps, Palpitations, Rapid Heart Rate and Swelling of Extremities. Gastrointestinal Present- Bloody Stool. Not Present- Abdominal Pain, Bloating, Change in Bowel Habits, Chronic diarrhea, Constipation, Difficulty Swallowing, Excessive gas, Gets full quickly at meals, Hemorrhoids, Indigestion, Nausea, Rectal Pain and Vomiting. Musculoskeletal Present- Back Pain. Not Present- Joint Pain, Joint Stiffness, Muscle Pain, Muscle Weakness and Swelling of Extremities. Neurological Present- Headaches. Not Present- Decreased Memory, Fainting, Numbness, Seizures, Tingling, Tremor, Trouble walking and Weakness. Endocrine Not Present- Cold Intolerance, Excessive Hunger, Hair Changes, Heat Intolerance, Hot flashes and New Diabetes.   Vitals Denise Ray Denise Ray; 05/15/2014 8:39 AM) 05/15/2014 8:37 AM Weight: 248 lb Height:  64in Body Surface Area: 2.25 m Body Mass Index: 42.57 kg/m Temp.: 98.42F  Pulse: 78 (Regular)  BP: 130/90 (Sitting, Left Arm, Standard)    Physical Exam (Kinser Fellman O. Hulen Skains MD; 05/15/2014 9:12 AM) Rectal Anorectal Exam External - skin tag(Two external skin tags. One posterior about 1-2 cm, looks like benign skin tag. The anteiror tag is a condyloma with friability and tenderness, about 2-3 cm in size.) and warts (See description under skin tags). Internal - internal hemorrhoids(at  the 5:00 position with 12 being directly posterior. Condylomata at 6:00.). Proctoscopic exam-Internal hemorrhoids, No Anal Strictures.    Assessment & Plan Jeneen Rinks O. Paislea Hatton MD; 05/15/2014 9:20 AM) INTERNAL HEMORRHOID (455.0  K64.8) Current Plans  ANOSCOPY, DIAGNOSTIC (46600) ANAL CONDYLOMA (078.11  A63.0) Impression: Patient with history of HVP infection, has had hysterectomy. Now with bleeding, itching, painful anal lesion at the 6:00 position. Also has internal hemorrhoid at the 5:00 position. Will schedule EUA, with internal hemorrhoidectomy and excision of condylomata and anterior skin tag.An terior non-condylomata skin tag is at the 12 position    Signed by Gwenyth Ober, MD (05/15/2014 9:23 AM)

## 2014-06-15 ENCOUNTER — Ambulatory Visit (HOSPITAL_BASED_OUTPATIENT_CLINIC_OR_DEPARTMENT_OTHER): Payer: 59 | Admitting: Anesthesiology

## 2014-06-15 ENCOUNTER — Encounter (HOSPITAL_BASED_OUTPATIENT_CLINIC_OR_DEPARTMENT_OTHER)
Admission: RE | Disposition: A | Discharge status: Home / Self Care | Payer: Self-pay | Source: Ambulatory Visit | Attending: General Surgery

## 2014-06-15 ENCOUNTER — Encounter (HOSPITAL_BASED_OUTPATIENT_CLINIC_OR_DEPARTMENT_OTHER): Payer: Self-pay | Admitting: *Deleted

## 2014-06-15 ENCOUNTER — Ambulatory Visit (HOSPITAL_BASED_OUTPATIENT_CLINIC_OR_DEPARTMENT_OTHER)
Admission: RE | Admit: 2014-06-15 | Discharge: 2014-06-15 | Disposition: A | Discharge status: Home / Self Care | Payer: 59 | Source: Ambulatory Visit | Attending: General Surgery | Admitting: General Surgery

## 2014-06-15 DIAGNOSIS — Z8673 Personal history of transient ischemic attack (TIA), and cerebral infarction without residual deficits: Secondary | ICD-10-CM | POA: Insufficient documentation

## 2014-06-15 DIAGNOSIS — F419 Anxiety disorder, unspecified: Secondary | ICD-10-CM | POA: Insufficient documentation

## 2014-06-15 DIAGNOSIS — L918 Other hypertrophic disorders of the skin: Secondary | ICD-10-CM | POA: Insufficient documentation

## 2014-06-15 DIAGNOSIS — J45909 Unspecified asthma, uncomplicated: Secondary | ICD-10-CM | POA: Insufficient documentation

## 2014-06-15 DIAGNOSIS — K648 Other hemorrhoids: Secondary | ICD-10-CM | POA: Insufficient documentation

## 2014-06-15 DIAGNOSIS — K219 Gastro-esophageal reflux disease without esophagitis: Secondary | ICD-10-CM | POA: Insufficient documentation

## 2014-06-15 DIAGNOSIS — Z7951 Long term (current) use of inhaled steroids: Secondary | ICD-10-CM | POA: Insufficient documentation

## 2014-06-15 DIAGNOSIS — Z79899 Other long term (current) drug therapy: Secondary | ICD-10-CM | POA: Insufficient documentation

## 2014-06-15 DIAGNOSIS — F17210 Nicotine dependence, cigarettes, uncomplicated: Secondary | ICD-10-CM | POA: Insufficient documentation

## 2014-06-15 DIAGNOSIS — G43909 Migraine, unspecified, not intractable, without status migrainosus: Secondary | ICD-10-CM | POA: Insufficient documentation

## 2014-06-15 DIAGNOSIS — E039 Hypothyroidism, unspecified: Secondary | ICD-10-CM | POA: Insufficient documentation

## 2014-06-15 DIAGNOSIS — Z8541 Personal history of malignant neoplasm of cervix uteri: Secondary | ICD-10-CM | POA: Insufficient documentation

## 2014-06-15 DIAGNOSIS — E780 Pure hypercholesterolemia: Secondary | ICD-10-CM | POA: Insufficient documentation

## 2014-06-15 DIAGNOSIS — F329 Major depressive disorder, single episode, unspecified: Secondary | ICD-10-CM | POA: Insufficient documentation

## 2014-06-15 DIAGNOSIS — Z6841 Body Mass Index (BMI) 40.0 and over, adult: Secondary | ICD-10-CM | POA: Insufficient documentation

## 2014-06-15 DIAGNOSIS — G4730 Sleep apnea, unspecified: Secondary | ICD-10-CM | POA: Insufficient documentation

## 2014-06-15 DIAGNOSIS — M1990 Unspecified osteoarthritis, unspecified site: Secondary | ICD-10-CM | POA: Insufficient documentation

## 2014-06-15 DIAGNOSIS — Z90710 Acquired absence of both cervix and uterus: Secondary | ICD-10-CM | POA: Insufficient documentation

## 2014-06-15 HISTORY — DX: Other chronic pain: G89.29

## 2014-06-15 HISTORY — DX: Family history of other specified conditions: Z84.89

## 2014-06-15 HISTORY — PX: EXCISION OF SKIN TAG: SHX6270

## 2014-06-15 HISTORY — DX: Unspecified hemorrhoids: K64.9

## 2014-06-15 HISTORY — DX: Migraine, unspecified, not intractable, without status migrainosus: G43.909

## 2014-06-15 HISTORY — DX: Unspecified osteoarthritis, unspecified site: M19.90

## 2014-06-15 HISTORY — PX: EVALUATION UNDER ANESTHESIA WITH HEMORRHOIDECTOMY: SHX5624

## 2014-06-15 HISTORY — DX: Personal history of transient ischemic attack (TIA), and cerebral infarction without residual deficits: Z86.73

## 2014-06-15 SURGERY — EXAM UNDER ANESTHESIA WITH HEMORRHOIDECTOMY
Anesthesia: General | Site: Anus

## 2014-06-15 MED ORDER — LACTATED RINGERS IV SOLN
INTRAVENOUS | Status: DC
  Administered 2014-06-15 (×2): via INTRAVENOUS

## 2014-06-15 MED ORDER — HYDROCODONE-ACETAMINOPHEN 5-325 MG PO TABS: 1.0000 | ORAL_TABLET | ORAL | Status: DC | PRN

## 2014-06-15 MED ORDER — DIBUCAINE 1 % RE OINT
TOPICAL_OINTMENT | RECTAL | Status: AC
  Filled 2014-06-15: qty 28

## 2014-06-15 MED ORDER — BUPIVACAINE LIPOSOME 1.3 % IJ SUSP
INTRAMUSCULAR | Status: AC
Start: 2014-06-15 — End: 2014-06-15
  Filled 2014-06-15: qty 20

## 2014-06-15 MED ORDER — MIDAZOLAM HCL 2 MG/2ML IJ SOLN: 1.0000 mg | INTRAMUSCULAR | Status: DC | PRN

## 2014-06-15 MED ORDER — MIDAZOLAM HCL 2 MG/2ML IJ SOLN
INTRAMUSCULAR | Status: AC
  Filled 2014-06-15: qty 2

## 2014-06-15 MED ORDER — FENTANYL CITRATE 0.05 MG/ML IJ SOLN
INTRAMUSCULAR | Status: AC
  Filled 2014-06-15: qty 6

## 2014-06-15 MED ORDER — DIBUCAINE 1 % RE OINT
TOPICAL_OINTMENT | RECTAL | Status: DC | PRN
  Administered 2014-06-15: 1 via RECTAL

## 2014-06-15 MED ORDER — HYDROMORPHONE HCL 1 MG/ML IJ SOLN
INTRAMUSCULAR | Status: AC
  Filled 2014-06-15: qty 1

## 2014-06-15 MED ORDER — HYDROMORPHONE HCL 1 MG/ML IJ SOLN
0.2500 mg | INTRAMUSCULAR | Status: DC | PRN
  Administered 2014-06-15 (×2): 0.5 mg via INTRAVENOUS

## 2014-06-15 MED ORDER — FENTANYL CITRATE 0.05 MG/ML IJ SOLN
INTRAMUSCULAR | Status: DC | PRN
Start: 2014-06-15 — End: 2014-06-15
  Administered 2014-06-15 (×2): 50 ug via INTRAVENOUS
  Administered 2014-06-15: 100 ug via INTRAVENOUS

## 2014-06-15 MED ORDER — SUCCINYLCHOLINE CHLORIDE 20 MG/ML IJ SOLN
INTRAMUSCULAR | Status: DC | PRN
  Administered 2014-06-15: 100 mg via INTRAVENOUS

## 2014-06-15 MED ORDER — DEXTROSE 5 % IV SOLN
INTRAVENOUS | Status: AC
  Filled 2014-06-15 (×2): qty 1

## 2014-06-15 MED ORDER — BUPIVACAINE LIPOSOME 1.3 % IJ SUSP
INTRAMUSCULAR | Status: DC | PRN
  Administered 2014-06-15: 20 mL

## 2014-06-15 MED ORDER — DEXTROSE 5 % IV SOLN
2.0000 g | INTRAVENOUS | Status: AC
  Administered 2014-06-15: 2 g via INTRAVENOUS

## 2014-06-15 MED ORDER — DEXAMETHASONE SODIUM PHOSPHATE 4 MG/ML IJ SOLN
INTRAMUSCULAR | Status: DC | PRN
  Administered 2014-06-15: 10 mg via INTRAVENOUS

## 2014-06-15 MED ORDER — PROPOFOL 10 MG/ML IV BOLUS
INTRAVENOUS | Status: DC | PRN
  Administered 2014-06-15: 150 mg via INTRAVENOUS

## 2014-06-15 MED ORDER — MIDAZOLAM HCL 2 MG/ML PO SYRP: 12.0000 mg | ORAL_SOLUTION | Freq: Once | ORAL | Status: DC | PRN

## 2014-06-15 MED ORDER — LIDOCAINE HCL (CARDIAC) 20 MG/ML IV SOLN
INTRAVENOUS | Status: DC | PRN
  Administered 2014-06-15: 40 mg via INTRAVENOUS

## 2014-06-15 MED ORDER — MIDAZOLAM HCL 5 MG/5ML IJ SOLN
INTRAMUSCULAR | Status: DC | PRN
  Administered 2014-06-15: 2 mg via INTRAVENOUS

## 2014-06-15 MED ORDER — FENTANYL CITRATE 0.05 MG/ML IJ SOLN: 50.0000 ug | INTRAMUSCULAR | Status: DC | PRN

## 2014-06-15 MED ORDER — FLEET ENEMA 7-19 GM/118ML RE ENEM: 1.0000 | ENEMA | Freq: Once | RECTAL | Status: DC

## 2014-06-15 SURGICAL SUPPLY — 47 items
APL SKNCLS STERI-STRIP NONHPOA (GAUZE/BANDAGES/DRESSINGS) ×2
BENZOIN TINCTURE PRP APPL 2/3 (GAUZE/BANDAGES/DRESSINGS) ×6 IMPLANT
BLADE SURG 15 STRL LF DISP TIS (BLADE) ×1 IMPLANT
BLADE SURG 15 STRL SS (BLADE) ×3
BRIEF STRETCH FOR OB PAD LRG (UNDERPADS AND DIAPERS) IMPLANT
CANISTER SUCT 1200ML W/VALVE (MISCELLANEOUS) ×3 IMPLANT
CLEANER CAUTERY TIP 5X5 PAD (MISCELLANEOUS) ×1 IMPLANT
COVER BACK TABLE 60X90IN (DRAPES) ×3 IMPLANT
COVER MAYO STAND STRL (DRAPES) ×3 IMPLANT
DECANTER SPIKE VIAL GLASS SM (MISCELLANEOUS) IMPLANT
DRAPE LAPAROTOMY 100X72 PEDS (DRAPES) ×3 IMPLANT
DRAPE UTILITY XL STRL (DRAPES) ×3 IMPLANT
DRSG PAD ABDOMINAL 8X10 ST (GAUZE/BANDAGES/DRESSINGS) ×3 IMPLANT
ELECT REM PT RETURN 9FT ADLT (ELECTROSURGICAL) ×3
ELECTRODE REM PT RTRN 9FT ADLT (ELECTROSURGICAL) ×1 IMPLANT
GAUZE SPONGE 4X4 16PLY XRAY LF (GAUZE/BANDAGES/DRESSINGS) IMPLANT
GLOVE BIOGEL PI IND STRL 7.0 (GLOVE) ×1 IMPLANT
GLOVE BIOGEL PI IND STRL 8 (GLOVE) ×1 IMPLANT
GLOVE BIOGEL PI INDICATOR 7.0 (GLOVE) ×2
GLOVE BIOGEL PI INDICATOR 8 (GLOVE) ×2
GLOVE ECLIPSE 6.5 STRL STRAW (GLOVE) ×3 IMPLANT
GLOVE ECLIPSE 7.5 STRL STRAW (GLOVE) ×3 IMPLANT
GOWN STRL REUS W/ TWL LRG LVL3 (GOWN DISPOSABLE) ×2 IMPLANT
GOWN STRL REUS W/TWL LRG LVL3 (GOWN DISPOSABLE) ×6
NDL SAFETY ECLIPSE 18X1.5 (NEEDLE) IMPLANT
NEEDLE HYPO 18GX1.5 SHARP (NEEDLE)
NEEDLE HYPO 22GX1.5 SAFETY (NEEDLE) ×3 IMPLANT
NS IRRIG 1000ML POUR BTL (IV SOLUTION) ×3 IMPLANT
PACK BASIN DAY SURGERY FS (CUSTOM PROCEDURE TRAY) ×3 IMPLANT
PACK LITHOTOMY IV (CUSTOM PROCEDURE TRAY) IMPLANT
PAD CLEANER CAUTERY TIP 5X5 (MISCELLANEOUS) ×2
PENCIL BUTTON HOLSTER BLD 10FT (ELECTRODE) ×3 IMPLANT
SPONGE GAUZE 4X4 12PLY STER LF (GAUZE/BANDAGES/DRESSINGS) ×3 IMPLANT
SPONGE HEMORRHOID 8X3CM (HEMOSTASIS) ×3 IMPLANT
SPONGE SURGIFOAM ABS GEL 100 (HEMOSTASIS) IMPLANT
SURGILUBE 2OZ TUBE FLIPTOP (MISCELLANEOUS) ×3 IMPLANT
SUT CHROMIC 3 0 SH 27 (SUTURE) ×6 IMPLANT
SYR BULB 3OZ (MISCELLANEOUS) IMPLANT
SYR CONTROL 10ML LL (SYRINGE) ×3 IMPLANT
TAPE CLOTH 2X10 TAN LF (GAUZE/BANDAGES/DRESSINGS) ×3 IMPLANT
TOWEL OR 17X24 6PK STRL BLUE (TOWEL DISPOSABLE) ×3 IMPLANT
TOWEL OR NON WOVEN STRL DISP B (DISPOSABLE) IMPLANT
TRAY PROCTOSCOPIC FIBER OPTIC (SET/KITS/TRAYS/PACK) IMPLANT
TUBE CONNECTING 20'X1/4 (TUBING) ×1
TUBE CONNECTING 20X1/4 (TUBING) ×2 IMPLANT
UNDERPAD 30X30 INCONTINENT (UNDERPADS AND DIAPERS) ×3 IMPLANT
YANKAUER SUCT BULB TIP NO VENT (SUCTIONS) ×3 IMPLANT

## 2014-06-15 NOTE — Discharge Instructions (Addendum)
Post Anesthesia Home Care Instructions  Activity: Get plenty of rest for the remainder of the day. A responsible adult should stay with you for 24 hours following the procedure.  For the next 24 hours, DO NOT: -Drive a car -Paediatric nurse -Drink alcoholic beverages -Take any medication unless instructed by your physician -Make any legal decisions or sign important papers.  Meals: Start with liquid foods such as gelatin or soup. Progress to regular foods as tolerated. Avoid greasy, spicy, heavy foods. If nausea and/or vomiting occur, drink only clear liquids until the nausea and/or vomiting subsides. Call your physician if vomiting continues.  Special Instructions/Symptoms: Your throat may feel dry or sore from the anesthesia or the breathing tube placed in your throat during surgery. If this causes discomfort, gargle with warm salt water. The discomfort should disappear within 24 hours.   Hemorrhoidectomy Care After  Hemorrhoidectomy is the removal of enlarged (dilated) veins around the rectum. Until the surgical areas are healed, control of pain and avoiding constipation are the greatest challenges for patients.  For as long as 24 hours after receiving an anesthetic (the medication that made you sleep), and while taking narcotic pain relievers, you may feel dizzy, weak and drowsy. For that reason, the following information applies to the first 24-hour period following surgery, and continues for as long as you are taking narcotic pain medications. 1. Do not drive a car, ride a bicycle, participate in activities in which you could be hurt. Do not take public transportation until you are off narcotic pain medications and until your caregiver says it is okay. 2. Do not drink alcohol, take tranquilizers, or medications not prescribed or allowed by your surgical caregiver. 3. Do not sign important papers or contracts for at least 24 hours or while taking narcotic medications. 4. Have a  responsible person with you for 24 hours. RISKS AND COMPLICATIONS Some problems that may occur following this procedure include: 1. Infection. A germ starts growing in the tissue surrounding the site operated on. This can usually be treated with antibiotics. 2. Damage to the rectal sphincter could occur. This is the muscle that opens in your anus to allow a bowel movement. This could cause incontinence. This is uncommon. 3. Bleeding following surgery can be a complication of almost any surgery. Your surgeon takes every precaution to keep this from happening. 4. Complications of anesthesia. HOME CARE INSTRUCTIONS 1. Avoid straining when having bowel movements. 2. Avoid heavy lifting (more than 10 pounds (4.5 kilograms)). 3. Only take over-the-counter or prescription medicines for pain, discomfort, or fever as directed by your caregiver. 4. Take hot sitz baths for 20 to 30 minutes, 3 to 4 times per day. 5. To keep swelling down, apply an ice pack for twenty minutes three to four times per day between sitz baths. Use a towel between your skin and the ice pack. Do not do this if it causes too much discomfort. 6. Keep anal area clean and dry. Following a bowel movement, you can gently wash the area with tucks (available for purchase at a drugstore) or cotton swabs. Gently pat the area dry. Do not rub the area. 7. Eat a well balanced diet and drink 6 to 8 glasses of water every day to avoid constipation. A bulk laxative may be also be helpful. SEEK MEDICAL CARE IF:  1. You have increasing pain or tenderness near or in the surgical site. 2. You are unable to eat or drink. 3. You develop nausea or vomiting. 4. You  develop uncontrolled bleeding such as soaking two to three pads in one hour. 5. You have constipation, not helped by changing your diet or increasing your fluid intake. Pain medications are a common cause of constipation. 6. You have pain and redness (inflammation) extending outside the area  of your surgery. 7. You develop an unexplained oral temperature above 102 F (38.9 C), or any other signs of infection. 8. You have any other questions or concerns following surgery. Document Released: 07/25/2003 Document Revised: 07/27/2011 Document Reviewed: 10/22/2008 [facility] Patient Information 2015 [place], [place]. This information is not intended to replace advice given to you by your health care provider. Make sure you discuss any questions you have with your health care provider.  Information for Discharge Teaching: EXPAREL (bupivacaine liposome injectable suspension)   Your surgeon gave you EXPAREL(bupivacaine) in your surgical incision to help control your pain after surgery.   EXPAREL is a local anesthetic that provides pain relief by numbing the tissue around the surgical site.  EXPAREL is designed to release pain medication over time and can control pain for up to 72 hours.  Depending on how you respond to EXPAREL, you may require less pain medication during your recovery.  Possible side effects:  Temporary loss of sensation or ability to move in the area where bupivacaine was injected.  Nausea, vomiting, constipation  Rarely, numbness and tingling in your mouth or lips, lightheadedness, or anxiety may occur.  Call your doctor right away if you think you may be experiencing any of these sensations, or if you have other questions regarding possible side effects.  Follow all other discharge instructions given to you by your surgeon or nurse. Eat a healthy diet and drink plenty of water or other fluids.  If you return to the hospital for any reason within 96 hours following the administration of EXPAREL, please inform your health care providers.

## 2014-06-15 NOTE — Anesthesia Postprocedure Evaluation (Signed)
Anesthesia Post Note  Patient: Denise Ray  Procedure(s) Performed: Procedure(s) (LRB): EXAM UNDER ANESTHESIA WITH HEMORRHOIDECTOMY (N/A) EXCISION OF SKIN TAGS (N/A)  Anesthesia type: general  Patient location: PACU  Post pain: Pain level controlled  Post assessment: Patient's Cardiovascular Status Stable  Last Vitals:  Filed Vitals:   06/15/14 1406  BP:   Pulse: 82  Temp:   Resp: 18    Post vital signs: Reviewed and stable  Level of consciousness: sedated  Complications: No apparent anesthesia complications

## 2014-06-15 NOTE — Interval H&P Note (Signed)
History and Physical Interval Note: Patient no more symptomatic.  Will be in the prone position 06/15/2014 11:51 AM  Denise Ray  has presented today for surgery, with the diagnosis of Symptomatic hemorrhoids and skin tags.  The various methods of treatment have been discussed with the patient and family. After consideration of risks, benefits and other options for treatment, the patient has consented to  Procedure(s): EXAM UNDER ANESTHESIA WITH HEMORRHOIDECTOMY (N/A) EXCISION OF SKIN TAGS (N/A) as a surgical intervention .  The patient's history has been reviewed, patient examined, no change in status, stable for surgery.  I have reviewed the patient's chart and labs.  Questions were answered to the patient's satisfaction.     Jeris Roser, JAY

## 2014-06-15 NOTE — Transfer of Care (Signed)
Immediate Anesthesia Transfer of Care Note  Patient: Denise Ray  Procedure(s) Performed: Procedure(s): EXAM UNDER ANESTHESIA WITH HEMORRHOIDECTOMY (N/A) EXCISION OF SKIN TAGS (N/A)  Patient Location: PACU  Anesthesia Type:General  Level of Consciousness: sedated  Airway & Oxygen Therapy: Patient Spontanous Breathing and Patient connected to face mask oxygen  Post-op Assessment: Report given to RN and Post -op Vital signs reviewed and stable  Post vital signs: Reviewed and stable  Last Vitals:  Filed Vitals:   06/15/14 0926  BP: 145/56  Pulse: 66  Temp: 36.6 C  Resp: 18    Complications: No apparent anesthesia complications

## 2014-06-15 NOTE — Anesthesia Procedure Notes (Signed)
Procedure Name: Intubation Date/Time: 06/15/2014 11:53 AM Performed by: Maryella Shivers Pre-anesthesia Checklist: Patient identified, Emergency Drugs available, Suction available and Patient being monitored Patient Re-evaluated:Patient Re-evaluated prior to inductionOxygen Delivery Method: Circle System Utilized Preoxygenation: Pre-oxygenation with 100% oxygen Intubation Type: IV induction Ventilation: Mask ventilation without difficulty Laryngoscope Size: Mac and 3 Grade View: Grade II Tube type: Oral Tube size: 7.0 mm Number of attempts: 1 Airway Equipment and Method: Stylet and Oral airway Placement Confirmation: ETT inserted through vocal cords under direct vision,  positive ETCO2 and breath sounds checked- equal and bilateral Secured at: 20 cm Tube secured with: Tape Dental Injury: Teeth and Oropharynx as per pre-operative assessment

## 2014-06-15 NOTE — Anesthesia Preprocedure Evaluation (Addendum)
Anesthesia Evaluation  Patient identified by MRN, date of birth, ID band Patient awake    Reviewed: Allergy & Precautions, H&P , NPO status , Patient's Chart, lab work & pertinent test results  Airway Mallampati: II  TM Distance: >3 FB Neck ROM: Full    Dental no notable dental hx. (+) Teeth Intact, Dental Advisory Given   Pulmonary asthma , sleep apnea and Continuous Positive Airway Pressure Ventilation , Current Smoker,  breath sounds clear to auscultation  Pulmonary exam normal       Cardiovascular negative cardio ROS  Rhythm:Regular Rate:Normal     Neuro/Psych  Headaches, Anxiety Depression negative neurological ROS  negative psych ROS   GI/Hepatic Neg liver ROS, GERD-  Medicated and Controlled,  Endo/Other  Hypothyroidism Morbid obesity  Renal/GU negative Renal ROS  negative genitourinary   Musculoskeletal  (+) Arthritis -, Osteoarthritis,    Abdominal   Peds  Hematology negative hematology ROS (+)   Anesthesia Other Findings   Reproductive/Obstetrics negative OB ROS                            Anesthesia Physical Anesthesia Plan  ASA: III  Anesthesia Plan: General   Post-op Pain Management:    Induction: Intravenous  Airway Management Planned: LMA and Oral ETT  Additional Equipment:   Intra-op Plan:   Post-operative Plan: Extubation in OR  Informed Consent: I have reviewed the patients History and Physical, chart, labs and discussed the procedure including the risks, benefits and alternatives for the proposed anesthesia with the patient or authorized representative who has indicated his/her understanding and acceptance.   Dental advisory given  Plan Discussed with: CRNA  Anesthesia Plan Comments:         Anesthesia Quick Evaluation

## 2014-06-15 NOTE — Op Note (Signed)
OPERATIVE REPORT  DATE OF OPERATION: 06/15/2014  PATIENT:  Denise Ray  44 y.o. female  PRE-OPERATIVE DIAGNOSIS:  Symptomatic hemorrhoids and skin tags.  POST-OPERATIVE DIAGNOSIS:  Symptomatic hemorrhoids and skin tags.  PROCEDURE:  Procedure(s): EXAM UNDER ANESTHESIA WITH HEMORRHOIDECTOMY EXCISION OF SKIN TAG  SURGEON:  Surgeon(s): Doreen Salvage, MD  ASSISTANT: None  ANESTHESIA:   general  EBL: <30 ml  BLOOD ADMINISTERED: none  DRAINS: none   SPECIMEN:  Source of Specimen:  Hemorrhoical tissue from Right posterior 1:00 position and External skin tag of condylomata at 6:00  COUNTS CORRECT:  YES  PROCEDURE DETAILS: The patient was taken to the operating room and placed initially on the table in the supine position. After an adequate general endotracheal anesthetic was administered she was slipped into the jackknife prone position and prepped and draped in the usual sterile manner.  A proper timeout was performed identifying the patient and the procedure to be performed. Using a large anal speculum we examined the anal and rectal area for hemorrhoidal disease and has external skin test to be removed. She had one internal hemorrhoidal complex at the 1:00 position with 12:00 being directly posterior. This is associated with an external skin tag which was excised along with internal hemorrhoid. 3-0 chromic was placed at the base which was run as a locking stitch up to the external skin. Hemostasis was obtained with cautery and the locking stitch.  The external skin tag which is a condylomata at the 6:00 position was excised using electrocautery and 3-0 chromic stitch was used to control bleeding and also partially close the area. Subsequent to removing the skin tag we injected the circumferential area of the anorectal site using 20 mL of Exparel.  A Gelfoam roll covered with Debbie cane ointment was placed in the anorectal vault. A dressing was subsequently applied area all needle  counts, sponge counts, and instrument counts were correct.  PATIENT DISPOSITION:  PACU - hemodynamically stable.   Christna Kulick, JAY 1/29/201612:43 PM

## 2014-06-18 ENCOUNTER — Encounter (HOSPITAL_COMMUNITY): Payer: Self-pay | Admitting: Emergency Medicine

## 2014-06-18 ENCOUNTER — Emergency Department (HOSPITAL_COMMUNITY)
Admission: EM | Admit: 2014-06-18 | Discharge: 2014-06-18 | Disposition: A | Discharge status: Home / Self Care | Payer: 59 | Attending: Emergency Medicine | Admitting: Emergency Medicine

## 2014-06-18 ENCOUNTER — Emergency Department (HOSPITAL_COMMUNITY): Payer: 59

## 2014-06-18 DIAGNOSIS — Z720 Tobacco use: Secondary | ICD-10-CM | POA: Insufficient documentation

## 2014-06-18 DIAGNOSIS — R339 Retention of urine, unspecified: Secondary | ICD-10-CM | POA: Insufficient documentation

## 2014-06-18 DIAGNOSIS — Z9049 Acquired absence of other specified parts of digestive tract: Secondary | ICD-10-CM | POA: Diagnosis not present

## 2014-06-18 DIAGNOSIS — F419 Anxiety disorder, unspecified: Secondary | ICD-10-CM | POA: Insufficient documentation

## 2014-06-18 DIAGNOSIS — Z8719 Personal history of other diseases of the digestive system: Secondary | ICD-10-CM

## 2014-06-18 DIAGNOSIS — M1990 Unspecified osteoarthritis, unspecified site: Secondary | ICD-10-CM | POA: Insufficient documentation

## 2014-06-18 DIAGNOSIS — Z8679 Personal history of other diseases of the circulatory system: Secondary | ICD-10-CM | POA: Diagnosis not present

## 2014-06-18 DIAGNOSIS — J45901 Unspecified asthma with (acute) exacerbation: Secondary | ICD-10-CM | POA: Insufficient documentation

## 2014-06-18 DIAGNOSIS — Z9889 Other specified postprocedural states: Secondary | ICD-10-CM

## 2014-06-18 DIAGNOSIS — G8929 Other chronic pain: Secondary | ICD-10-CM | POA: Insufficient documentation

## 2014-06-18 DIAGNOSIS — E039 Hypothyroidism, unspecified: Secondary | ICD-10-CM | POA: Insufficient documentation

## 2014-06-18 DIAGNOSIS — Z79899 Other long term (current) drug therapy: Secondary | ICD-10-CM | POA: Diagnosis not present

## 2014-06-18 DIAGNOSIS — Z8673 Personal history of transient ischemic attack (TIA), and cerebral infarction without residual deficits: Secondary | ICD-10-CM | POA: Diagnosis not present

## 2014-06-18 DIAGNOSIS — K219 Gastro-esophageal reflux disease without esophagitis: Secondary | ICD-10-CM | POA: Insufficient documentation

## 2014-06-18 LAB — CBC
HEMATOCRIT: 40.6 % (ref 36.0–46.0)
Hemoglobin: 13.6 g/dL (ref 12.0–15.0)
MCH: 29.4 pg (ref 26.0–34.0)
MCHC: 33.5 g/dL (ref 30.0–36.0)
MCV: 87.9 fL (ref 78.0–100.0)
PLATELETS: 290 10*3/uL (ref 150–400)
RBC: 4.62 MIL/uL (ref 3.87–5.11)
RDW: 13.3 % (ref 11.5–15.5)
WBC: 9.5 10*3/uL (ref 4.0–10.5)

## 2014-06-18 LAB — URINALYSIS, ROUTINE W REFLEX MICROSCOPIC
Bilirubin Urine: NEGATIVE
GLUCOSE, UA: NEGATIVE mg/dL
Hgb urine dipstick: NEGATIVE
Ketones, ur: NEGATIVE mg/dL
LEUKOCYTES UA: NEGATIVE
Nitrite: NEGATIVE
PH: 6.5 (ref 5.0–8.0)
Protein, ur: NEGATIVE mg/dL
SPECIFIC GRAVITY, URINE: 1.003 — AB (ref 1.005–1.030)
UROBILINOGEN UA: 0.2 mg/dL (ref 0.0–1.0)

## 2014-06-18 LAB — COMPREHENSIVE METABOLIC PANEL
ALBUMIN: 3.7 g/dL (ref 3.5–5.2)
ALT: 14 U/L (ref 0–35)
AST: 15 U/L (ref 0–37)
Alkaline Phosphatase: 61 U/L (ref 39–117)
Anion gap: 7 (ref 5–15)
BUN: 7 mg/dL (ref 6–23)
CALCIUM: 9 mg/dL (ref 8.4–10.5)
CO2: 24 mmol/L (ref 19–32)
CREATININE: 0.6 mg/dL (ref 0.50–1.10)
Chloride: 105 mmol/L (ref 96–112)
Glucose, Bld: 85 mg/dL (ref 70–99)
Potassium: 3.2 mmol/L — ABNORMAL LOW (ref 3.5–5.1)
SODIUM: 136 mmol/L (ref 135–145)
Total Bilirubin: 0.4 mg/dL (ref 0.3–1.2)
Total Protein: 7.1 g/dL (ref 6.0–8.3)

## 2014-06-18 LAB — I-STAT TROPONIN, ED: Troponin i, poc: 0 ng/mL (ref 0.00–0.08)

## 2014-06-18 IMAGING — CR DG CHEST 2V
2 series · 2 of 2 positions shown · non-contrast
Comparison: [DATE]

CLINICAL DATA: Postoperative shortness of breath, urinary
retention. Hemorrhoidectomy 2 days ago

EXAM:
CHEST  2 VIEW

[w chest pa]
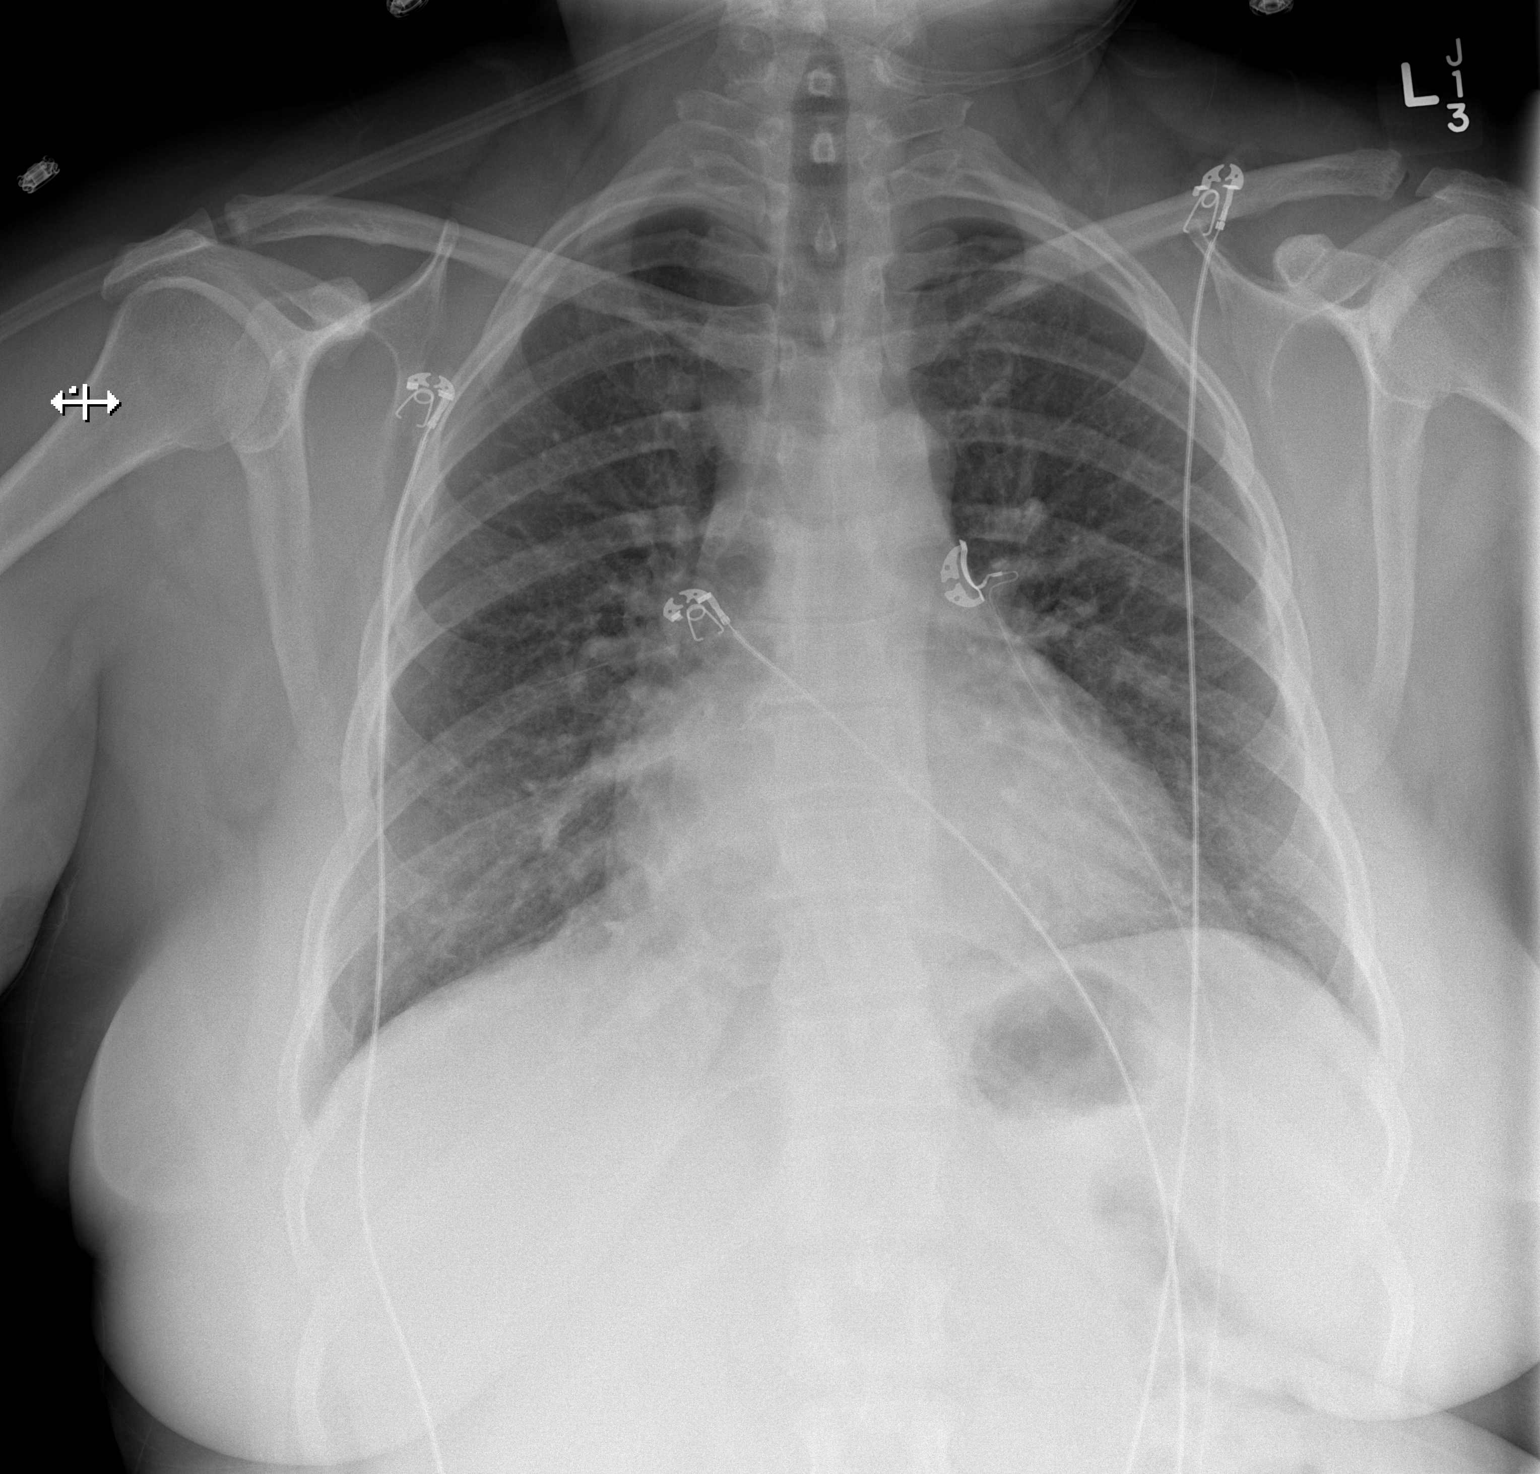

[w chest lat]
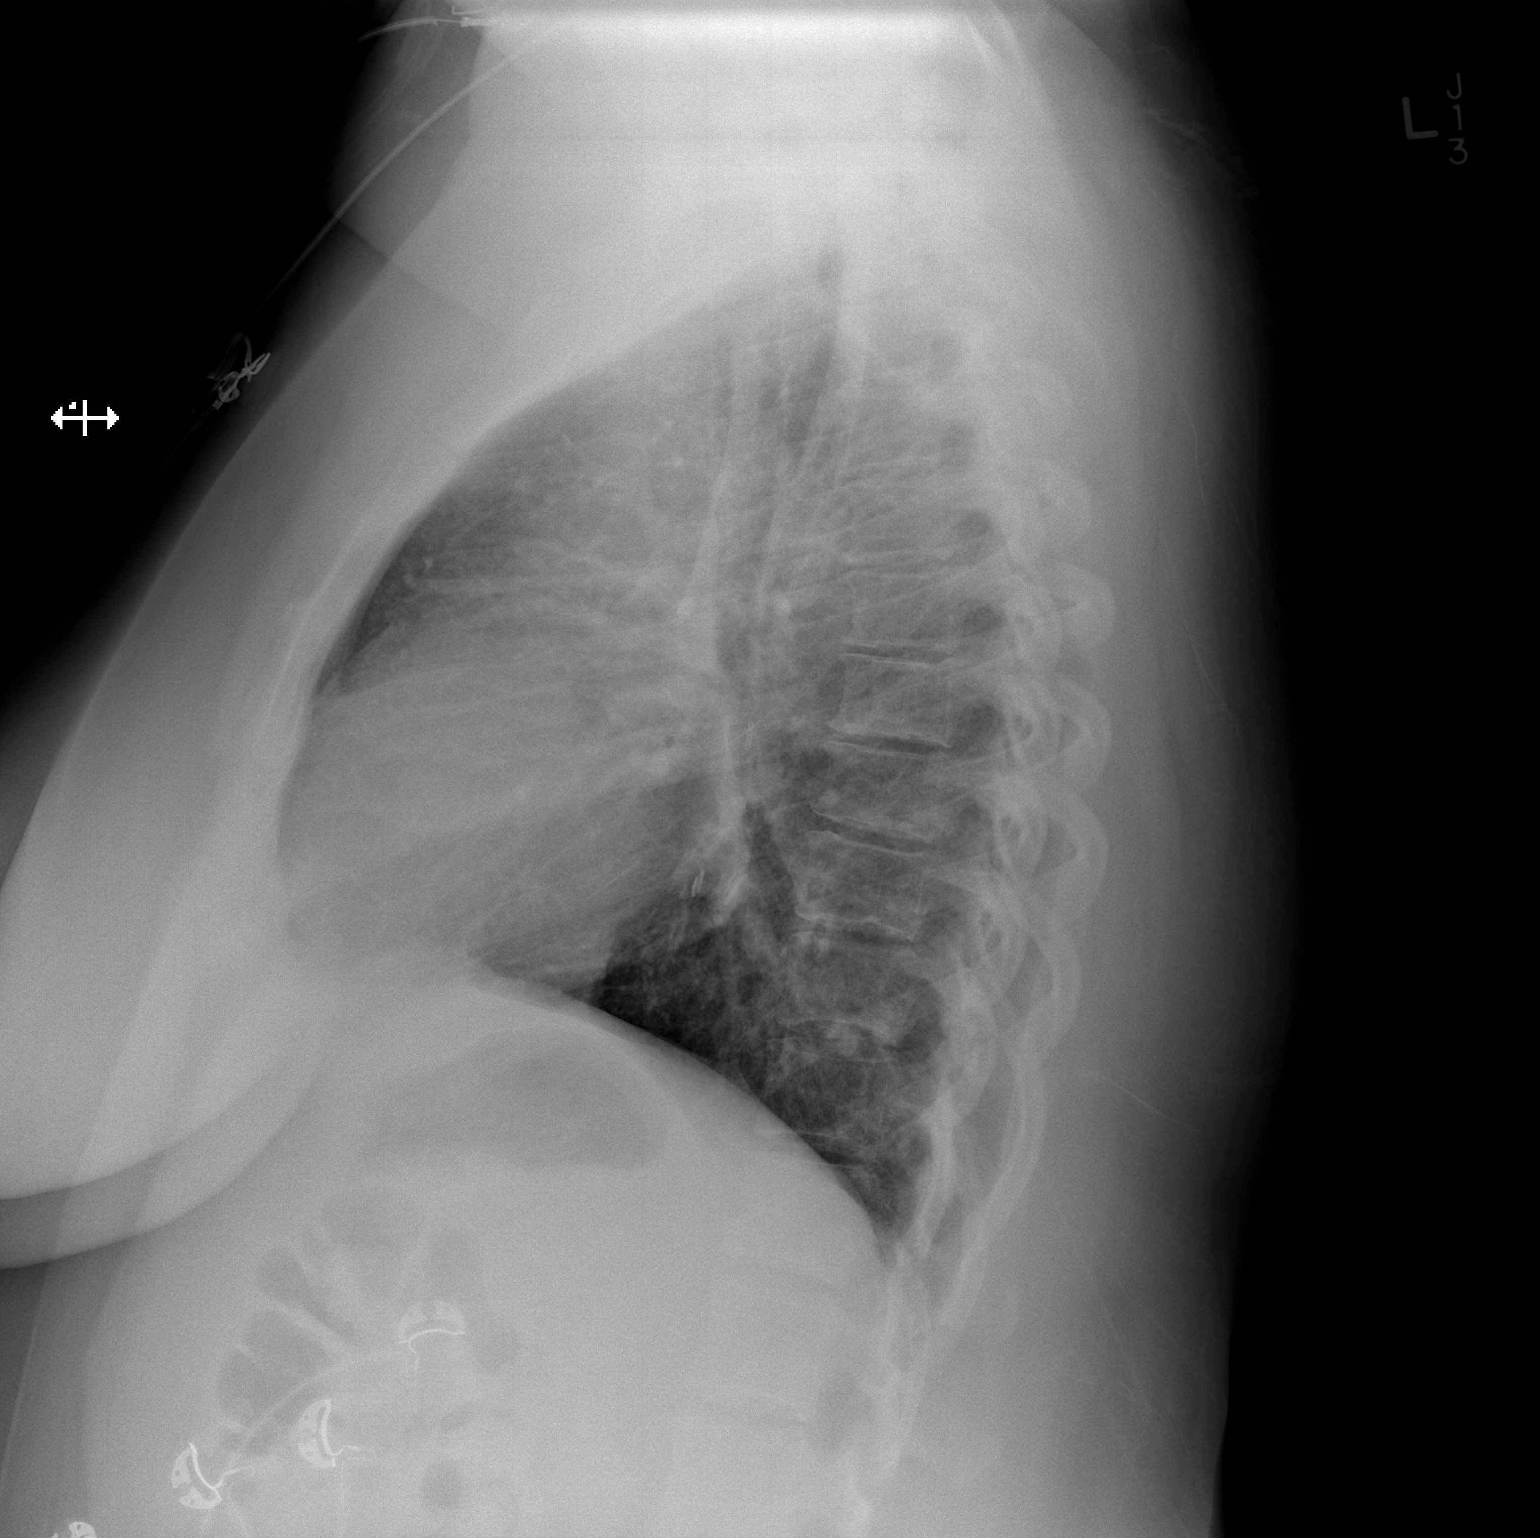

[2 of 2 positions shown; findings below may reference images not displayed]

FINDINGS: Borderline cardiac enlargement with central vascular congestive
noted without overt edema or focal pulmonary opacity. No pleural
effusion. No acute osseous finding.
IMPRESSION: Borderline cardiomegaly with central vascular congestion but no
overt edema.

## 2014-06-18 MED ORDER — HYDROMORPHONE HCL 1 MG/ML IJ SOLN
1.0000 mg | Freq: Once | INTRAMUSCULAR | Status: AC
  Administered 2014-06-18: 1 mg via INTRAVENOUS
  Filled 2014-06-18: qty 1

## 2014-06-18 MED ORDER — LORAZEPAM 2 MG/ML IJ SOLN
1.0000 mg | Freq: Once | INTRAMUSCULAR | Status: AC
  Administered 2014-06-18: 1 mg via INTRAVENOUS
  Filled 2014-06-18: qty 1

## 2014-06-18 MED ORDER — ALBUTEROL SULFATE (2.5 MG/3ML) 0.083% IN NEBU
5.0000 mg | INHALATION_SOLUTION | Freq: Once | RESPIRATORY_TRACT | Status: DC
  Filled 2014-06-18: qty 6

## 2014-06-18 MED ORDER — IPRATROPIUM-ALBUTEROL 0.5-2.5 (3) MG/3ML IN SOLN
3.0000 mL | Freq: Once | RESPIRATORY_TRACT | Status: AC
  Administered 2014-06-18: 3 mL via RESPIRATORY_TRACT
  Filled 2014-06-18: qty 3

## 2014-06-18 NOTE — ED Notes (Signed)
Pt unable to urinate at this time. Still feels as though she has to urinate. MD Delo notified. Will bladder scan.

## 2014-06-18 NOTE — ED Notes (Signed)
Pt from  Home c/o urinary retention. She had a hemrhoidectomy on Friday and in pre-op was the last time she urinated.  Pt appears to be short of breath expiratory wheezes on the left.

## 2014-06-18 NOTE — ED Notes (Addendum)
Pt reports SOB with severe rectal pain (hemmorrhoidectomy Friday and skin tag removal). Has had urinary retention and "slow bowel movements" since then. Pt is visibly labored with breathing. Hx TIA, asthma. Patient does take Xanax for anxiety, did not have dose this morning. Not on blood thinners. MD at bedside.

## 2014-06-18 NOTE — ED Notes (Signed)
Bladder scan 56 ml notified doctor delo

## 2014-06-18 NOTE — ED Provider Notes (Signed)
CSN: [id number]     Arrival date & time 06/18/14  48 History   First MD Initiated Contact with Patient 06/18/14 1137     Chief Complaint  Patient presents with  . Urinary Retention  . Post-op Problem  . Shortness of Breath  . Anxiety     (Consider location/radiation/quality/duration/timing/severity/associated sxs/prior Treatment) HPI Comments: Patient is a 44 year old female with past history of asthma. She is status post hemorrhoid surgery on Friday by Dr. Hulen Skains. She states she has been having difficulty urinating and moving her bowels since that time. She denies any fevers or chills. She denies any bleeding. She states when she is in pain, she develops wheezing and difficulty breathing. She denies any productive cough.  The history is provided by the patient.    Past Medical History  Diagnosis Date  . GERD (gastroesophageal reflux disease)   . Migraines   . Arthritis     right hip  . History of TIA (transient ischemic attack)     caused by medication reaction  . Asthma     daily inhaler use  . Hypothyroidism        . Anxiety   . Hemorrhoids 05/2014  . Skin tags, anus or rectum 05/2014  . Sleep apnea     has not used CPAP in 2 mos., per pt.  . Family history of adverse reaction to anesthesia     pt's mother has hx. of post-op N/V  . Chronic pain     widespread, per pt.   Past Surgical History  Procedure Laterality Date  . Conization of cervix  2000  . Hysteroscopy w/d&c  09/12/2007  . Chest wall/lung mass resection  7/03  . Robotic assisted total hysterectomy N/A 10/11/2012    Procedure: ROBOTIC ASSISTED TOTAL HYSTERECTOMY;  Surgeon: Lyman Speller, MD;  Location: [facility];  Service: Gynecology;  Laterality: N/A;  . Salpingoophorectomy Bilateral 10/11/2012    Procedure: SALPINGO OOPHORECTOMY;  Surgeon: Lyman Speller, MD;  Location: [facility];  Service: Gynecology;  Laterality: Bilateral;  . Hysteroscopy  08/29/2003  . Abdominal hysterectomy    . Video assisted  thoracoscopy (vats)/thorocotomy Right 11/04/2001  . Dilation and curettage of uterus      multiple  . Cesarean section  2001  . Iud removal  09/12/2007   Family History  Problem Relation Age of Onset  . Breast cancer Brother 15  . Breast cancer Maternal Aunt   . Depression Mother   . Anesthesia problems Mother     post-op N/V  . Alcohol abuse Father   . Alcohol abuse Cousin    History  Substance Use Topics  . Smoking status: Current Every Day Smoker -- 1.00 packs/day for 30 years    Types: Cigarettes  . Smokeless tobacco: Never Used  . Alcohol Use: Yes     Comment: rarely   OB History    Gravida Para Term Preterm AB TAB SAB Ectopic Multiple Living   1 1             Review of Systems  All other systems reviewed and are negative.     Allergies  Tamiflu; Estrogens; Wellbutrin; and Morphine and related  Home Medications   Prior to Admission medications   Medication Sig Start Date End Date Taking? Authorizing Provider  albuterol (PROVENTIL HFA;VENTOLIN HFA) 108 (90 BASE) MCG/ACT inhaler Inhale 2 puffs into the lungs every 6 (six) hours as needed. wheezing 01/06/13  Yes Lysbeth Penner, FNP  albuterol (PROVENTIL) (5 MG/ML)  0.5% nebulizer solution Take 2.5 mg by nebulization every 6 (six) hours as needed for wheezing or shortness of breath.   Yes Historical Provider, MD  ALPRAZolam Duanne Moron) 1 MG tablet Take 1 mg by mouth 2 (two) times daily. 11/05/13  Yes Marijean Heath, NP  HYDROcodone-acetaminophen (NORCO/VICODIN) 5-325 MG per tablet Take 1-2 tablets by mouth every 4 (four) hours as needed for moderate pain. 06/15/14  Yes Doreen Salvage, MD  levothyroxine (SYNTHROID, LEVOTHROID) 50 MCG tablet Take 50 mcg by mouth daily before breakfast.   Yes Historical Provider, MD  pantoprazole (PROTONIX) 40 MG tablet Take 40 mg by mouth daily.   Yes Historical Provider, MD  polyethylene glycol (MIRALAX / GLYCOLAX) packet Take 17 g by mouth daily as needed for mild constipation.   Yes  Historical Provider, MD   BP 153/87 mmHg  Pulse 104  Temp(Src) 98.1 F (36.7 C) (Oral)  Resp 18  SpO2 99% Physical Exam  Constitutional: She is oriented to person, place, and time. She appears well-developed and well-nourished. No distress.  HENT:  Head: Normocephalic and atraumatic.  Neck: Normal range of motion. Neck supple.  Cardiovascular: Normal rate and regular rhythm.  Exam reveals no gallop and no friction rub.   No murmur heard. Pulmonary/Chest: Effort normal. No respiratory distress. She has wheezes.  There are slight expiratory wheezes present.  Abdominal: Soft. Bowel sounds are normal. She exhibits no distension. There is no tenderness.  Genitourinary:  The rectum and anus appear grossly normal. There are postsurgical changes, however no purulent drainage, significant swelling, or bleeding.  Musculoskeletal: Normal range of motion.  Neurological: She is alert and oriented to person, place, and time.  Skin: Skin is warm and dry. She is not diaphoretic.  Nursing note and vitals reviewed.   ED Course  Procedures (including critical care time) Labs Review Labs Reviewed  CBC  COMPREHENSIVE METABOLIC PANEL  URINALYSIS, ROUTINE W REFLEX MICROSCOPIC  I-STAT [place], ED    Imaging Review No results found.   Date: 06/18/2014  Rate: 73  Rhythm: normal sinus rhythm  QRS Axis: normal  Intervals: normal  ST/T Wave abnormalities: normal  Conduction Disutrbances:none  Narrative Interpretation:   Old EKG Reviewed: none available    MDM   Final diagnoses:  None    Patient presents here complaining of an inability to urinate. She is 2 days status post hemorrhoidectomy. She was cathed and approximately 300 mL of urine were obtained. She is now feeling better and her workup is essentially unremarkable. There was initially some wheezing, however this improved with a breathing treatment. Her urinalysis reveals no evidence for infection, chest x-ray does not show  anything acute, there is no white count, and electrolytes are unremarkable. I feel as though she is appropriate for discharge. She is to follow-up with her surgeon as needed and return to the emergency department as needed.    Veryl Speak, MD 06/18/14 [phone]

## 2014-06-18 NOTE — ED Notes (Signed)
Patient transported to X-ray 

## 2014-06-18 NOTE — Discharge Instructions (Signed)
Follow-up with your surgeon if not improving in the next 2 days, and return to the ER if your symptoms substantially worsen or change.   Acute Urinary Retention Acute urinary retention is the temporary inability to urinate. This is an uncommon problem in women. It can be caused by:  Infection.  A side effect of a medicine.  A problem in a nearby organ that presses or squeezes on the bladder or the urethra (the tube that drains the bladder).  Psychological problems.   Surgery on your bladder, urethra, or pelvic organs that causes obstruction to the outflow of urine from your bladder. HOME CARE INSTRUCTIONS  If you are sent home with a Foley catheter and a drainage system, you will need to discuss the best course of action with your health care provider. While the catheter is in, maintain a good intake of fluids. Keep the drainage bag emptied and lower than your catheter. This is so that contaminated urine will not flow back into your bladder, which could lead to a urinary tract infection. There are two main types of drainage bags. One is a large bag that usually is used at night. It has a good capacity that will allow you to sleep through the night without having to empty it. The second type is called a leg bag. It has a smaller capacity so it needs to be emptied more frequently. However, the main advantage is that it can be attached by a leg strap and goes underneath your clothing, allowing you the freedom to move about or leave your home. Only take over-the-counter or prescription medicines for pain, discomfort, or fever as directed by your health care provider.  SEEK MEDICAL CARE IF:  You develop a low-grade fever.  You experience spasms or leakage of urine with the spasms. SEEK IMMEDIATE MEDICAL CARE IF:   You develop chills or fever.  Your catheter stops draining urine.  Your catheter falls out.  You start to develop increased bleeding that does not respond to rest and increased  fluid intake. MAKE SURE YOU:  Understand these instructions.  Will watch your condition.  Will get help right away if you are not doing well or get worse. Document Released: 05/03/2006 Document Revised: 02/22/2013 Document Reviewed: 10/13/2012 [facility] Patient Information 2015 [place], [place]. This information is not intended to replace advice given to you by your health care provider. Make sure you discuss any questions you have with your health care provider.

## 2014-06-18 NOTE — ED Notes (Signed)
AVS explained to patient in detail. Knows to call surgeon today for follow-up within 2 days. Has chronic prescription for Norco and Xanax. Knows to take both and continue bladder relaxation techniques (sitz bath, heat therapy, bladder massage) to be able to pass urine. Knows to return here if unable to do so. No other questions/concerns.

## 2014-06-20 ENCOUNTER — Ambulatory Visit: Payer: Self-pay | Admitting: Pulmonary Disease

## 2014-10-04 ENCOUNTER — Encounter (HOSPITAL_BASED_OUTPATIENT_CLINIC_OR_DEPARTMENT_OTHER): Payer: Self-pay | Admitting: *Deleted

## 2014-10-05 ENCOUNTER — Encounter (HOSPITAL_BASED_OUTPATIENT_CLINIC_OR_DEPARTMENT_OTHER): Payer: Self-pay | Admitting: *Deleted

## 2014-10-05 NOTE — Progress Notes (Signed)
Pt instructed npo pmn 5/25 x protonix, xanax, synthroid,w sip of water.  Pt to use nebulizer at home and bring rescue inhaler w her. To [facility] 5/26 @ 0600. Needs hgb on arrival

## 2014-10-10 ENCOUNTER — Other Ambulatory Visit: Payer: Self-pay | Admitting: General Surgery

## 2014-10-10 NOTE — H&P (Signed)
  Denise Ray 08/21/2014 9:57 AM Location: [facility] Patient #: [id number] DOB: [birth year] Divorced / Language: [name] / Race: White Female  History of Present Illness Leighton Ruff MD; 10/17/5636 2:05 PM) The patient is a 44 year old female who presents with anal lesions. Patient underwent hemorrhoidectomy and removal of anal condyloma in late January. She is still recovering from the surgery. Her pathology showed AIN 2 and 3 within the biopsy specimens. She is here today for further evaluation. She is having regular bowel movements. She denies any rectal bleeding. She has no family history of anal cancer but does have a couple of uncles with early onset colon cancer. She has a h/o abnormal pap smears but the last few have been normal per pt.    Allergies (Sonya Bynum, CMA; 08/21/2014 9:58 AM) Morphine Derivatives  Medication History (Sonya Bynum, CMA; 08/21/2014 9:58 AM) Hydrocodone-Acetaminophen (5-325MG  Tablet, Oral) Active. ALPRAZolam (1MG  Tablet, Oral) Active. MethylPREDNISolone (Pak) (4MG  Tablet, Oral) Active. Levothyroxine Sodium (50MCG Tablet, Oral) Active. Pantoprazole Sodium (40MG  Tablet DR, Oral) Active. PredniSONE (20MG  Tablet, Oral) Active. Medications Reconciled  Vitals (Sonya Bynum CMA; 08/21/2014 9:58 AM) 08/21/2014 9:58 AM Weight: 245 lb Height: 64in Body Surface Area: 2.24 m Body Mass Index: 42.05 kg/m Temp.: 97.34F(Temporal)  Pulse: 81 (Regular)  BP: 134/78 (Sitting, Left Arm, Standard)    Physical Exam Leighton Ruff MD; 01/18/7341 10:29 AM) General Mental Status-Alert. General Appearance-Consistent with stated age. Hydration-Well hydrated. Voice-Normal.  Head and Neck Head-normocephalic, atraumatic with no lesions or palpable masses. Trachea-midline. Thyroid Gland Characteristics - normal size and consistency.  Eye Eyeball - Bilateral-Extraocular movements intact. Sclera/Conjunctiva - Bilateral-No  scleral icterus.  Chest and Lung Exam Chest and lung exam reveals -quiet, even and easy respiratory effort with no use of accessory muscles and on auscultation, normal breath sounds, no adventitious sounds and normal vocal resonance. Inspection Chest Wall - Normal. Back - normal.  Cardiovascular Cardiovascular examination reveals -normal heart sounds, regular rate and rhythm with no murmurs and normal pedal pulses bilaterally.  Abdomen Inspection Inspection of the abdomen reveals - No Hernias. Palpation/Percussion Palpation and Percussion of the abdomen reveal - Soft, Non Tender, No Rebound tenderness, No Rigidity (guarding) and No hepatosplenomegaly. Auscultation Auscultation of the abdomen reveals - Bowel sounds normal.  Rectal Anorectal Exam External - Note: Left anterior and right posterior areas of possible AIN. No fixed masses.  Neurologic Neurologic evaluation reveals -alert and oriented x 3 with no impairment of recent or remote memory. Mental Status-Normal.  Musculoskeletal Global Assessment -Note: no gross deformities.  Normal Exam - Left-Upper Extremity Strength Normal and Lower Extremity Strength Normal. Normal Exam - Right-Upper Extremity Strength Normal and Lower Extremity Strength Normal.    Assessment & Plan Leighton Ruff MD; 12/22/6809 2:04 PM) ANAL INTRAEPITHELIAL NEOPLASIA III (230.6  D01.3) Impression: I have recommended an HRA to evaluate the extent of her AIN and ablate with the laser. She would like to wait until she heals a bit more from her hemorrhoid surgery. We will plan on doing this in late May or early June.

## 2014-10-11 ENCOUNTER — Ambulatory Visit (HOSPITAL_BASED_OUTPATIENT_CLINIC_OR_DEPARTMENT_OTHER)
Admission: RE | Admit: 2014-10-11 | Discharge: 2014-10-11 | Disposition: A | Discharge status: Home / Self Care | Payer: 59 | Source: Ambulatory Visit | Attending: General Surgery | Admitting: General Surgery

## 2014-10-11 ENCOUNTER — Ambulatory Visit (HOSPITAL_BASED_OUTPATIENT_CLINIC_OR_DEPARTMENT_OTHER): Payer: 59 | Admitting: Anesthesiology

## 2014-10-11 ENCOUNTER — Encounter (HOSPITAL_BASED_OUTPATIENT_CLINIC_OR_DEPARTMENT_OTHER): Payer: Self-pay | Admitting: Anesthesiology

## 2014-10-11 ENCOUNTER — Encounter (HOSPITAL_BASED_OUTPATIENT_CLINIC_OR_DEPARTMENT_OTHER)
Admission: RE | Disposition: A | Discharge status: Home / Self Care | Payer: Self-pay | Source: Ambulatory Visit | Attending: General Surgery

## 2014-10-11 DIAGNOSIS — M1990 Unspecified osteoarthritis, unspecified site: Secondary | ICD-10-CM | POA: Insufficient documentation

## 2014-10-11 DIAGNOSIS — Z885 Allergy status to narcotic agent status: Secondary | ICD-10-CM | POA: Diagnosis not present

## 2014-10-11 DIAGNOSIS — F419 Anxiety disorder, unspecified: Secondary | ICD-10-CM | POA: Diagnosis not present

## 2014-10-11 DIAGNOSIS — J45909 Unspecified asthma, uncomplicated: Secondary | ICD-10-CM | POA: Diagnosis not present

## 2014-10-11 DIAGNOSIS — E119 Type 2 diabetes mellitus without complications: Secondary | ICD-10-CM | POA: Insufficient documentation

## 2014-10-11 DIAGNOSIS — D013 Carcinoma in situ of anus and anal canal: Secondary | ICD-10-CM | POA: Insufficient documentation

## 2014-10-11 DIAGNOSIS — Z6841 Body Mass Index (BMI) 40.0 and over, adult: Secondary | ICD-10-CM | POA: Diagnosis not present

## 2014-10-11 DIAGNOSIS — F329 Major depressive disorder, single episode, unspecified: Secondary | ICD-10-CM | POA: Insufficient documentation

## 2014-10-11 DIAGNOSIS — K219 Gastro-esophageal reflux disease without esophagitis: Secondary | ICD-10-CM | POA: Diagnosis not present

## 2014-10-11 DIAGNOSIS — E039 Hypothyroidism, unspecified: Secondary | ICD-10-CM | POA: Diagnosis not present

## 2014-10-11 DIAGNOSIS — F17210 Nicotine dependence, cigarettes, uncomplicated: Secondary | ICD-10-CM | POA: Insufficient documentation

## 2014-10-11 DIAGNOSIS — G4730 Sleep apnea, unspecified: Secondary | ICD-10-CM | POA: Insufficient documentation

## 2014-10-11 HISTORY — DX: Other complications of anesthesia, initial encounter: T88.59XA

## 2014-10-11 HISTORY — PX: CO2 LASER APPLICATION: SHX5778

## 2014-10-11 HISTORY — DX: Type 2 diabetes mellitus without complications: E11.9

## 2014-10-11 HISTORY — PX: RECTAL EXAM UNDER ANESTHESIA: SHX6399

## 2014-10-11 HISTORY — PX: HIGH RESOLUTION ANOSCOPY: SHX6345

## 2014-10-11 LAB — POCT HEMOGLOBIN-HEMACUE: HEMOGLOBIN: 14.8 g/dL (ref 12.0–15.0)

## 2014-10-11 SURGERY — ANOSCOPY, HIGH RESOLUTION
Anesthesia: Monitor Anesthesia Care | Site: Rectum

## 2014-10-11 MED ORDER — OXYCODONE HCL 5 MG PO TABS
5.0000 mg | ORAL_TABLET | ORAL | Status: DC | PRN
  Filled 2014-10-11: qty 2

## 2014-10-11 MED ORDER — ONDANSETRON HCL 4 MG/2ML IJ SOLN
INTRAMUSCULAR | Status: DC | PRN
  Administered 2014-10-11: 4 mg via INTRAVENOUS

## 2014-10-11 MED ORDER — FENTANYL CITRATE (PF) 100 MCG/2ML IJ SOLN
INTRAMUSCULAR | Status: DC | PRN
  Administered 2014-10-11 (×5): 12.5 ug via INTRAVENOUS
  Administered 2014-10-11: 25 ug via INTRAVENOUS
  Administered 2014-10-11: 12.5 ug via INTRAVENOUS

## 2014-10-11 MED ORDER — LACTATED RINGERS IV SOLN
INTRAVENOUS | Status: DC
  Administered 2014-10-11: 07:00:00 via INTRAVENOUS
  Filled 2014-10-11: qty 1000

## 2014-10-11 MED ORDER — LIDOCAINE HCL (CARDIAC) 20 MG/ML IV SOLN
INTRAVENOUS | Status: DC | PRN
  Administered 2014-10-11: 60 mg via INTRAVENOUS

## 2014-10-11 MED ORDER — MIDAZOLAM HCL 2 MG/2ML IJ SOLN
INTRAMUSCULAR | Status: AC
  Filled 2014-10-11: qty 6

## 2014-10-11 MED ORDER — SODIUM CHLORIDE 0.9 % IJ SOLN
3.0000 mL | Freq: Two times a day (BID) | INTRAMUSCULAR | Status: DC
  Filled 2014-10-11: qty 3

## 2014-10-11 MED ORDER — MIDAZOLAM HCL 5 MG/5ML IJ SOLN
INTRAMUSCULAR | Status: DC | PRN
  Administered 2014-10-11: 1 mg via INTRAVENOUS
  Administered 2014-10-11: 0.5 mg via INTRAVENOUS
  Administered 2014-10-11: 1 mg via INTRAVENOUS
  Administered 2014-10-11 (×3): 0.5 mg via INTRAVENOUS

## 2014-10-11 MED ORDER — ACETIC ACID 5 % SOLN
Status: DC | PRN
Start: 2014-10-11 — End: 2014-10-11
  Administered 2014-10-11: 1 via TOPICAL

## 2014-10-11 MED ORDER — LIDOCAINE 5 % EX OINT: 1.0000 "application " | TOPICAL_OINTMENT | CUTANEOUS | Status: DC | PRN

## 2014-10-11 MED ORDER — ACETAMINOPHEN 325 MG PO TABS
650.0000 mg | ORAL_TABLET | ORAL | Status: DC | PRN
  Filled 2014-10-11: qty 2

## 2014-10-11 MED ORDER — METOCLOPRAMIDE HCL 5 MG/ML IJ SOLN
INTRAMUSCULAR | Status: DC | PRN
  Administered 2014-10-11: 10 mg via INTRAVENOUS

## 2014-10-11 MED ORDER — DEXAMETHASONE SODIUM PHOSPHATE 4 MG/ML IJ SOLN
INTRAMUSCULAR | Status: DC | PRN
  Administered 2014-10-11: 10 mg via INTRAVENOUS

## 2014-10-11 MED ORDER — SILVER SULFADIAZINE 1 % EX CREA
TOPICAL_CREAM | CUTANEOUS | Status: DC | PRN
  Administered 2014-10-11: 1 via TOPICAL

## 2014-10-11 MED ORDER — SODIUM CHLORIDE 0.9 % IJ SOLN
3.0000 mL | INTRAMUSCULAR | Status: DC | PRN
Start: 2014-10-11 — End: 2014-10-11
  Filled 2014-10-11: qty 3

## 2014-10-11 MED ORDER — FENTANYL CITRATE (PF) 100 MCG/2ML IJ SOLN
INTRAMUSCULAR | Status: AC
  Filled 2014-10-11: qty 6

## 2014-10-11 MED ORDER — PROMETHAZINE HCL 25 MG/ML IJ SOLN
6.2500 mg | INTRAMUSCULAR | Status: DC | PRN
  Filled 2014-10-11: qty 1

## 2014-10-11 MED ORDER — ACETAMINOPHEN 650 MG RE SUPP
650.0000 mg | RECTAL | Status: DC | PRN
  Filled 2014-10-11: qty 1

## 2014-10-11 MED ORDER — ACETAMINOPHEN 10 MG/ML IV SOLN
INTRAVENOUS | Status: DC | PRN
  Administered 2014-10-11: 1000 mg via INTRAVENOUS

## 2014-10-11 MED ORDER — STERILE WATER FOR IRRIGATION IR SOLN
Status: DC | PRN
  Administered 2014-10-11: 500 mL

## 2014-10-11 MED ORDER — HYDROCODONE-ACETAMINOPHEN 5-325 MG PO TABS: 1.0000 | ORAL_TABLET | ORAL | Status: DC | PRN

## 2014-10-11 MED ORDER — PROPOFOL 500 MG/50ML IV EMUL
INTRAVENOUS | Status: DC | PRN
  Administered 2014-10-11: 50 ug/kg/min via INTRAVENOUS

## 2014-10-11 MED ORDER — FENTANYL CITRATE (PF) 100 MCG/2ML IJ SOLN
25.0000 ug | INTRAMUSCULAR | Status: DC | PRN
  Filled 2014-10-11: qty 1

## 2014-10-11 MED ORDER — BUPIVACAINE-EPINEPHRINE 0.5% -1:200000 IJ SOLN
INTRAMUSCULAR | Status: DC | PRN
  Administered 2014-10-11: 40 mL

## 2014-10-11 MED ORDER — KETOROLAC TROMETHAMINE 30 MG/ML IJ SOLN
INTRAMUSCULAR | Status: DC | PRN
  Administered 2014-10-11: 30 mg via INTRAVENOUS

## 2014-10-11 MED ORDER — PROPOFOL 10 MG/ML IV BOLUS
INTRAVENOUS | Status: DC | PRN
  Administered 2014-10-11: 20 mg via INTRAVENOUS

## 2014-10-11 MED ORDER — SODIUM CHLORIDE 0.9 % IV SOLN
250.0000 mL | INTRAVENOUS | Status: DC | PRN
  Filled 2014-10-11: qty 250

## 2014-10-11 MED ORDER — SODIUM CHLORIDE 0.9 % IR SOLN
Status: DC | PRN
  Administered 2014-10-11: 500 mL

## 2014-10-11 SURGICAL SUPPLY — 36 items
APL SKNCLS STERI-STRIP NONHPOA (GAUZE/BANDAGES/DRESSINGS) ×1
BENZOIN TINCTURE PRP APPL 2/3 (GAUZE/BANDAGES/DRESSINGS) ×3 IMPLANT
BLADE SURG 15 STRL LF DISP TIS (BLADE) ×2 IMPLANT
BLADE SURG 15 STRL SS (BLADE) ×6
BNDG GAUZE ELAST 4 BULKY (GAUZE/BANDAGES/DRESSINGS) ×3 IMPLANT
BRIEF STRETCH FOR OB PAD LRG (UNDERPADS AND DIAPERS) ×3 IMPLANT
CANISTER SUCTION 2500CC (MISCELLANEOUS) ×3 IMPLANT
COVER BACK TABLE 60X90IN (DRAPES) ×3 IMPLANT
COVER MAYO STAND STRL (DRAPES) ×3 IMPLANT
DRAPE LG THREE QUARTER DISP (DRAPES) ×3 IMPLANT
DRAPE PED LAPAROTOMY (DRAPES) ×3 IMPLANT
DRAPE UTILITY XL STRL (DRAPES) ×3 IMPLANT
DRSG PAD ABDOMINAL 8X10 ST (GAUZE/BANDAGES/DRESSINGS) ×3 IMPLANT
ELECT REM PT RETURN 9FT ADLT (ELECTROSURGICAL) ×3
ELECTRODE REM PT RTRN 9FT ADLT (ELECTROSURGICAL) ×1 IMPLANT
GLOVE BIO SURGEON STRL SZ 6.5 (GLOVE) ×4 IMPLANT
GLOVE BIO SURGEONS STRL SZ 6.5 (GLOVE) ×2
GLOVE INDICATOR 7.0 STRL GRN (GLOVE) ×3 IMPLANT
GOWN STRL REUS W/ TWL LRG LVL3 (GOWN DISPOSABLE) ×1 IMPLANT
GOWN STRL REUS W/ TWL XL LVL3 (GOWN DISPOSABLE) ×1 IMPLANT
GOWN STRL REUS W/TWL LRG LVL3 (GOWN DISPOSABLE) ×3
GOWN STRL REUS W/TWL XL LVL3 (GOWN DISPOSABLE) ×3
NEEDLE HYPO 25X1 1.5 SAFETY (NEEDLE) ×3 IMPLANT
NS IRRIG 500ML POUR BTL (IV SOLUTION) ×3 IMPLANT
PACK BASIN DAY SURGERY FS (CUSTOM PROCEDURE TRAY) ×3 IMPLANT
PAD ARMBOARD 7.5X6 YLW CONV (MISCELLANEOUS) ×3 IMPLANT
PENCIL BUTTON HOLSTER BLD 10FT (ELECTRODE) ×3 IMPLANT
SPONGE GAUZE 4X4 12PLY STER LF (GAUZE/BANDAGES/DRESSINGS) ×3 IMPLANT
SUT CHROMIC 3 0 SH 27 (SUTURE) ×3 IMPLANT
SYR BULB IRRIGATION 50ML (SYRINGE) ×3 IMPLANT
SYR CONTROL 10ML LL (SYRINGE) ×3 IMPLANT
TOWEL OR 17X24 6PK STRL BLUE (TOWEL DISPOSABLE) ×3 IMPLANT
TRAY DSU PREP LF (CUSTOM PROCEDURE TRAY) ×6 IMPLANT
TUBE CONNECTING 12'X1/4 (SUCTIONS) ×1
TUBE CONNECTING 12X1/4 (SUCTIONS) ×2 IMPLANT
YANKAUER SUCT BULB TIP NO VENT (SUCTIONS) ×3 IMPLANT

## 2014-10-11 NOTE — Transfer of Care (Signed)
Immediate Anesthesia Transfer of Care Note  Patient: Denise Ray  Procedure(s) Performed: Procedure(s) (LRB): HIGH RESOLUTION ANOSCOPY WITH BIOPSY (N/A) CO2 LASER APPLICATION (N/A) ANAL EXAM UNDER ANESTHESIA (N/A)  Patient Location: PACU  Anesthesia Type: MAC  Level of Consciousness: awake, sedated, patient cooperative and responds to stimulation  Airway & Oxygen Therapy: Patient Spontanous Breathing and Patient connected to face mask oxygen  Post-op Assessment: Report given to PACU RN, Post -op Vital signs reviewed and stable and Patient moving all extremities  Post vital signs: Reviewed and stable  Complications: No apparent anesthesia complications

## 2014-10-11 NOTE — Anesthesia Procedure Notes (Signed)
Procedure Name: MAC Date/Time: 10/11/2014 7:27 AM Performed by: Justice Rocher Pre-anesthesia Checklist: Patient identified, Timeout performed, Emergency Drugs available, Suction available and Patient being monitored Patient Re-evaluated:Patient Re-evaluated prior to inductionOxygen Delivery Method: Simple face mask Preoxygenation: Pre-oxygenation with 100% oxygen Intubation Type: IV induction Placement Confirmation: positive ETCO2 and breath sounds checked- equal and bilateral

## 2014-10-11 NOTE — H&P (Signed)
    History of Present Illness  The patient is a 44 year old female who presents with anal lesions. Patient underwent hemorrhoidectomy and removal of anal condyloma in late January. She is still recovering from the surgery. Her pathology showed AIN 2 and 3 within the biopsy specimens. She is here today for further evaluation. She is having regular bowel movements. She denies any rectal bleeding. She has no family history of anal cancer but does have a couple of uncles with early onset colon cancer. She has a h/o abnormal pap smears but the last few have been normal per pt.    Allergies (Sonya Bynum, CMA; 08/21/2014 9:58 AM) Morphine Derivatives  Medication History (Sonya Bynum, CMA; 08/21/2014 9:58 AM) Hydrocodone-Acetaminophen (5-325MG  Tablet, Oral) Active. ALPRAZolam (1MG  Tablet, Oral) Active. MethylPREDNISolone (Pak) (4MG  Tablet, Oral) Active. Levothyroxine Sodium (50MCG Tablet, Oral) Active. Pantoprazole Sodium (40MG  Tablet DR, Oral) Active. PredniSONE (20MG  Tablet, Oral) Active. Medications Reconciled  BP 125/62 mmHg  Pulse 81  Temp(Src) 98.1 F (36.7 C) (Oral)  Resp 18  Ht 5\' 4"  (1.626 m)  Wt 112.038 kg (247 lb)  BMI 42.38 kg/m2  SpO2 98%   Physical Exam  General Mental Status-Alert. General Appearance-Consistent with stated age. Hydration-Well hydrated. Voice-Normal.  Head and Neck Head-normocephalic, atraumatic with no lesions or palpable masses. Trachea-midline. Thyroid Gland Characteristics - normal size and consistency.  Eye Eyeball - Bilateral-Extraocular movements intact. Sclera/Conjunctiva - Bilateral-No scleral icterus.  Chest and Lung Exam Chest and lung exam reveals -quiet, even and easy respiratory effort with no use of accessory muscles and on auscultation, normal breath sounds, no adventitious sounds and normal vocal resonance. Inspection Chest Wall - Normal. Back - normal.  Cardiovascular Cardiovascular examination  reveals -normal heart sounds, regular rate and rhythm with no murmurs and normal pedal pulses bilaterally.  Abdomen Inspection Inspection of the abdomen reveals - No Hernias. Palpation/Percussion Palpation and Percussion of the abdomen reveal - Soft, Non Tender, No Rebound tenderness, No Rigidity (guarding) and No hepatosplenomegaly. Auscultation Auscultation of the abdomen reveals - Bowel sounds normal.  Rectal Anorectal Exam External - Note: Left anterior and right posterior areas of possible AIN. No fixed masses.  Neurologic Neurologic evaluation reveals -alert and oriented x 3 with no impairment of recent or remote memory. Mental Status-Normal.  Musculoskeletal Global Assessment -Note: no gross deformities.  Normal Exam - Left-Upper Extremity Strength Normal and Lower Extremity Strength Normal. Normal Exam - Right-Upper Extremity Strength Normal and Lower Extremity Strength Normal.    Assessment & Plan  ANAL INTRAEPITHELIAL NEOPLASIA III (230.6  D01.3) Impression: I have recommended an HRA to evaluate the extent of her AIN and ablate with the laser if needed.  Hopefully, we can then use this mapping to follow in the office.  Risks included bleeding, pain and recurrence.

## 2014-10-11 NOTE — Anesthesia Preprocedure Evaluation (Signed)
Anesthesia Evaluation  Patient identified by MRN, date of birth, ID band Patient awake    Reviewed: Allergy & Precautions, NPO status , Patient's Chart, lab work & pertinent test results  History of Anesthesia Complications (+) Family history of anesthesia reaction and history of anesthetic complications  Airway Mallampati: II  TM Distance: >3 FB Neck ROM: Full    Dental no notable dental hx.    Pulmonary asthma , sleep apnea and Continuous Positive Airway Pressure Ventilation , pneumonia -, resolved, Current Smoker,  breath sounds clear to auscultation  Pulmonary exam normal       Cardiovascular negative cardio ROS Normal cardiovascular examRhythm:Regular Rate:Normal     Neuro/Psych  Headaches, PSYCHIATRIC DISORDERS Anxiety Depression    GI/Hepatic Neg liver ROS, GERD-  Medicated,  Endo/Other  diabetes, Type 2Hypothyroidism Morbid obesity  Renal/GU negative Renal ROS  negative genitourinary   Musculoskeletal  (+) Arthritis -,   Abdominal (+) + obese,   Peds negative pediatric ROS (+)  Hematology negative hematology ROS (+)   Anesthesia Other Findings   Reproductive/Obstetrics negative OB ROS                             Anesthesia Physical Anesthesia Plan  ASA: II  Anesthesia Plan: MAC   Post-op Pain Management:    Induction: Intravenous  Airway Management Planned: Natural Airway  Additional Equipment:   Intra-op Plan:   Post-operative Plan:   Informed Consent: I have reviewed the patients History and Physical, chart, labs and discussed the procedure including the risks, benefits and alternatives for the proposed anesthesia with the patient or authorized representative who has indicated his/her understanding and acceptance.   Dental advisory given  Plan Discussed with: CRNA  Anesthesia Plan Comments:         Anesthesia Quick Evaluation

## 2014-10-11 NOTE — Discharge Instructions (Addendum)
Post Operative Instructions Following Laser Surgery  Laser treatment of condyloma (warts) is used to vaporize or eliminate the wart.  The laser actually creates a burn effect on the skin to accomplish this.  The following instructions will help in you comfort postoperatively:  First 24 h  Remove the dressing this evening after you return home from the hospital  Sit in a tub or sitz bath of COOL water for 20 minutes every hour while awake.  After the bath, carefully blot the area dry and place a piece of 100% Cotton with Silvadene on it next to the anal opening.  You may sit on a covered ice pack between the baths as needed.    You should have crushed ice in small amounts until you are able to urinate.  After you urinate, you may drink fluids.  It is normal to not urinate for the first few hours after surgery.  If you become uncomfortable, call the office for instructions.   Take your pain medications as prescribed  You may eat whatever you feel like.  Start with a light meal and gradually advance your diet.    Beginning the day after your surgery  Sit in a tub of cool to warm water for 15 minutes at least 3 times a day and after bowel movements  After the bowel movement, clean with wet cotton, Tucks or baby wipes.  Apply Silvadene to a thin piece of cotton and place over the anal opening after your baths.  This will collect any drainage or bleeding.  Drainage is usually a pink to tan color and is normal for the following 2-3 weeks after surgery.  Change to cotton ever 2-3 hours while awake.  Diet  Eat a regular diet.  Avoid foods that may constipate you or give you diarrhea.  Avoid foods with seeds, nuts, corn or popcorn.  Beginning the day after surgery, drink 6-8 glasses of water a day in addition to your meals.  Limit you caffeine intake to 1-2 servings per day.  Medication  Take a fiber supplement (Metamucil, Citrucel, FiberCon) twice a day.  Take a stool softener like Colace  twice daily  Take your pain medication as directed.  You may use Extra Strength Tylenol instead of your prescribed pain medication to relieve mild discomfort.  Avoid products containing aspirin.  Bowel Habits  You should have a bowel movement at least every other day.  If you are constipated, you may take a Fleet enema or 2 Dulcolax tablets.  Call the office if no results occur.  You may bear down a normal amount to have a bowel movement without hurting your tissues after the operation.  Activity  Walking is encouraged.  You may drive when you are no longer on prescription pain medication.  You may go up and down stairs carefully.  No heavy lifting or strenuous activity until after your first post operative appointment  Do NOT sit on a rubber ring; instead use a soft pillow if needed.  Call the office if you have any questions or concerns.  Call IMMEDIATELY if you develop:  Rectal bleeding  Increasing rectal pain  Fever greater than 100 F  Difficulty urinating       Post Anesthesia Home Care Instructions  Activity: Get plenty of rest for the remainder of the day. A responsible adult should stay with you for 24 hours following the procedure.  For the next 24 hours, DO NOT: -Drive a car -Paediatric nurse -Drink alcoholic beverages -  Take any medication unless instructed by your physician -Make any legal decisions or sign important papers.  Meals: Start with liquid foods such as gelatin or soup. Progress to regular foods as tolerated. Avoid greasy, spicy, heavy foods. If nausea and/or vomiting occur, drink only clear liquids until the nausea and/or vomiting subsides. Call your physician if vomiting continues.  Special Instructions/Symptoms: Your throat may feel dry or sore from the anesthesia or the breathing tube placed in your throat during surgery. If this causes discomfort, gargle with warm salt water. The discomfort should disappear within 24 hours.  If you had a  scopolamine patch placed behind your ear for the management of post- operative nausea and/or vomiting:  1. The medication in the patch is effective for 72 hours, after which it should be removed.  Wrap patch in a tissue and discard in the trash. Wash hands thoroughly with soap and water. 2. You may remove the patch earlier than 72 hours if you experience unpleasant side effects which may include dry mouth, dizziness or visual disturbances. 3. Avoid touching the patch. Wash your hands with soap and water after contact with the patch.

## 2014-10-11 NOTE — Anesthesia Postprocedure Evaluation (Signed)
  Anesthesia Post-op Note  Patient: Denise Ray  Procedure(s) Performed: Procedure(s) (LRB): HIGH RESOLUTION ANOSCOPY WITH BIOPSY (N/A) CO2 LASER APPLICATION (N/A) ANAL EXAM UNDER ANESTHESIA (N/A)  Patient Location: PACU  Anesthesia Type: MAC  Level of Consciousness: awake and alert   Airway and Oxygen Therapy: Patient Spontanous Breathing  Post-op Pain: mild  Post-op Assessment: Post-op Vital signs reviewed, Patient's Cardiovascular Status Stable, Respiratory Function Stable, Patent Airway and No signs of Nausea or vomiting  Last Vitals:  Filed Vitals:   10/11/14 1020  BP: 140/52  Pulse: 74  Temp: 36.9 C  Resp: 18    Post-op Vital Signs: stable   Complications: No apparent anesthesia complications

## 2014-10-11 NOTE — Op Note (Signed)
10/11/2014  8:14 AM  PATIENT:  Denise Ray  44 y.o. female  Patient Care Team: [name], MD as PCP - General (Pulmonary Disease)  PRE-OPERATIVE DIAGNOSIS:  AIN Grade 2&3  POST-OPERATIVE DIAGNOSIS:  AIN Grade 2&3  PROCEDURE:  HIGH RESOLUTION ANOSCOPY WITH BIOPSY CO2 LASER APPLICATION ANAL EXAM UNDER ANESTHESIA  Surgeon(s): Leighton Ruff, MD  ASSISTANT: none   ANESTHESIA:   local and MAC  SPECIMEN:  Source of Specimen:  anal canal lesions  DISPOSITION OF SPECIMEN:  PATHOLOGY  COUNTS:  YES  PLAN OF CARE: Discharge to home after PACU  PATIENT DISPOSITION:  PACU - hemodynamically stable.   INDICATION: 44 y.o. F with AIN found hemorrhoidectomy specimen.   OR FINDINGS:  1. pigmented lesion in anterior anal canal 2. L anterior anal canal lesion 3. L lateral anal canal lesion 4. Posterior anal canal lesion 5. R anal margin lesion 6. Anterior anal margin ulcer    DESCRIPTION: The patient was identified in the preoperative holding area and taken to the OR where they were laid prone on the operating room table in jack knife position. MAC anesthesia was smoothly induced.  The patient was then prepped and draped in the usual sterile fashion. A surgical timeout was performed indicating the correct patient, procedure, positioning and preoperative antibioitics. SCDs were noted to be in place and functioning prior to the operation.  A rectal block was performed using Marcaine with epinephrine.  After this was completed, a sponge was soaked in 5% acetic acid was placed over the perianal region. This was allowed to soak for 2 minutes. The sponge was removed and the perianal region was evaluated with a colposcope.  There were 2 anal margin lesions and a pigmented lesion in the posterior anal area.  The internal anal canal was evaluated via anoscopy with a Hill-Ferguson anoscope.  There were 3 suspicious lesions noted internally.  After this was completed, hemostasis was achieved  with electrocautery and some of the biopsy sites were closed using a 3-0 chromic suture.  Next the laser was brought onto the field.  The edges of the operative field were draped with wet towels.  Appropriate ventilation was obtained.  All staff were protected with small particle masks and goggles safe for the laser.  The laser was then activated. All suspicious lesions were ablated. Hemostasis was then achieved using electrocautery. A thin layer of Silvadene was then placed over the lesions. A sterile dressing was applied over this. The patient was then awakened from anesthesia and sent to the postanesthesia care unit in stable condition. All counts were correct operating room staff.

## 2014-10-12 ENCOUNTER — Encounter (HOSPITAL_BASED_OUTPATIENT_CLINIC_OR_DEPARTMENT_OTHER): Payer: Self-pay | Admitting: General Surgery

## 2015-01-30 ENCOUNTER — Ambulatory Visit (HOSPITAL_COMMUNITY)
Admission: RE | Admit: 2015-01-30 | Discharge: 2015-01-30 | Disposition: A | Discharge status: Home / Self Care | Payer: 59 | Source: Ambulatory Visit | Attending: Pulmonary Disease | Admitting: Pulmonary Disease

## 2015-01-30 ENCOUNTER — Other Ambulatory Visit (HOSPITAL_COMMUNITY): Payer: Self-pay | Admitting: Pulmonary Disease

## 2015-01-30 DIAGNOSIS — J45909 Unspecified asthma, uncomplicated: Secondary | ICD-10-CM | POA: Insufficient documentation

## 2015-01-30 DIAGNOSIS — R05 Cough: Secondary | ICD-10-CM | POA: Diagnosis not present

## 2015-01-30 DIAGNOSIS — E119 Type 2 diabetes mellitus without complications: Secondary | ICD-10-CM | POA: Insufficient documentation

## 2015-01-30 DIAGNOSIS — Z87891 Personal history of nicotine dependence: Secondary | ICD-10-CM | POA: Insufficient documentation

## 2015-01-30 DIAGNOSIS — J449 Chronic obstructive pulmonary disease, unspecified: Secondary | ICD-10-CM | POA: Diagnosis not present

## 2015-01-30 DIAGNOSIS — R0602 Shortness of breath: Secondary | ICD-10-CM | POA: Diagnosis not present

## 2015-01-30 DIAGNOSIS — J440 Chronic obstructive pulmonary disease with acute lower respiratory infection: Principal | ICD-10-CM

## 2015-01-30 DIAGNOSIS — J209 Acute bronchitis, unspecified: Secondary | ICD-10-CM

## 2015-01-30 IMAGING — DX DG CHEST 2V
2 series · 2 of 2 positions shown · non-contrast
Comparison: [DATE]

CLINICAL DATA: Increasing shortness of breath. Wheezing.
Intermittently productive cough for 6 days. History of COPD, asthma,
diabetes, pneumonia, and smoking.

EXAM:
CHEST  2 VIEW

[chest pa]
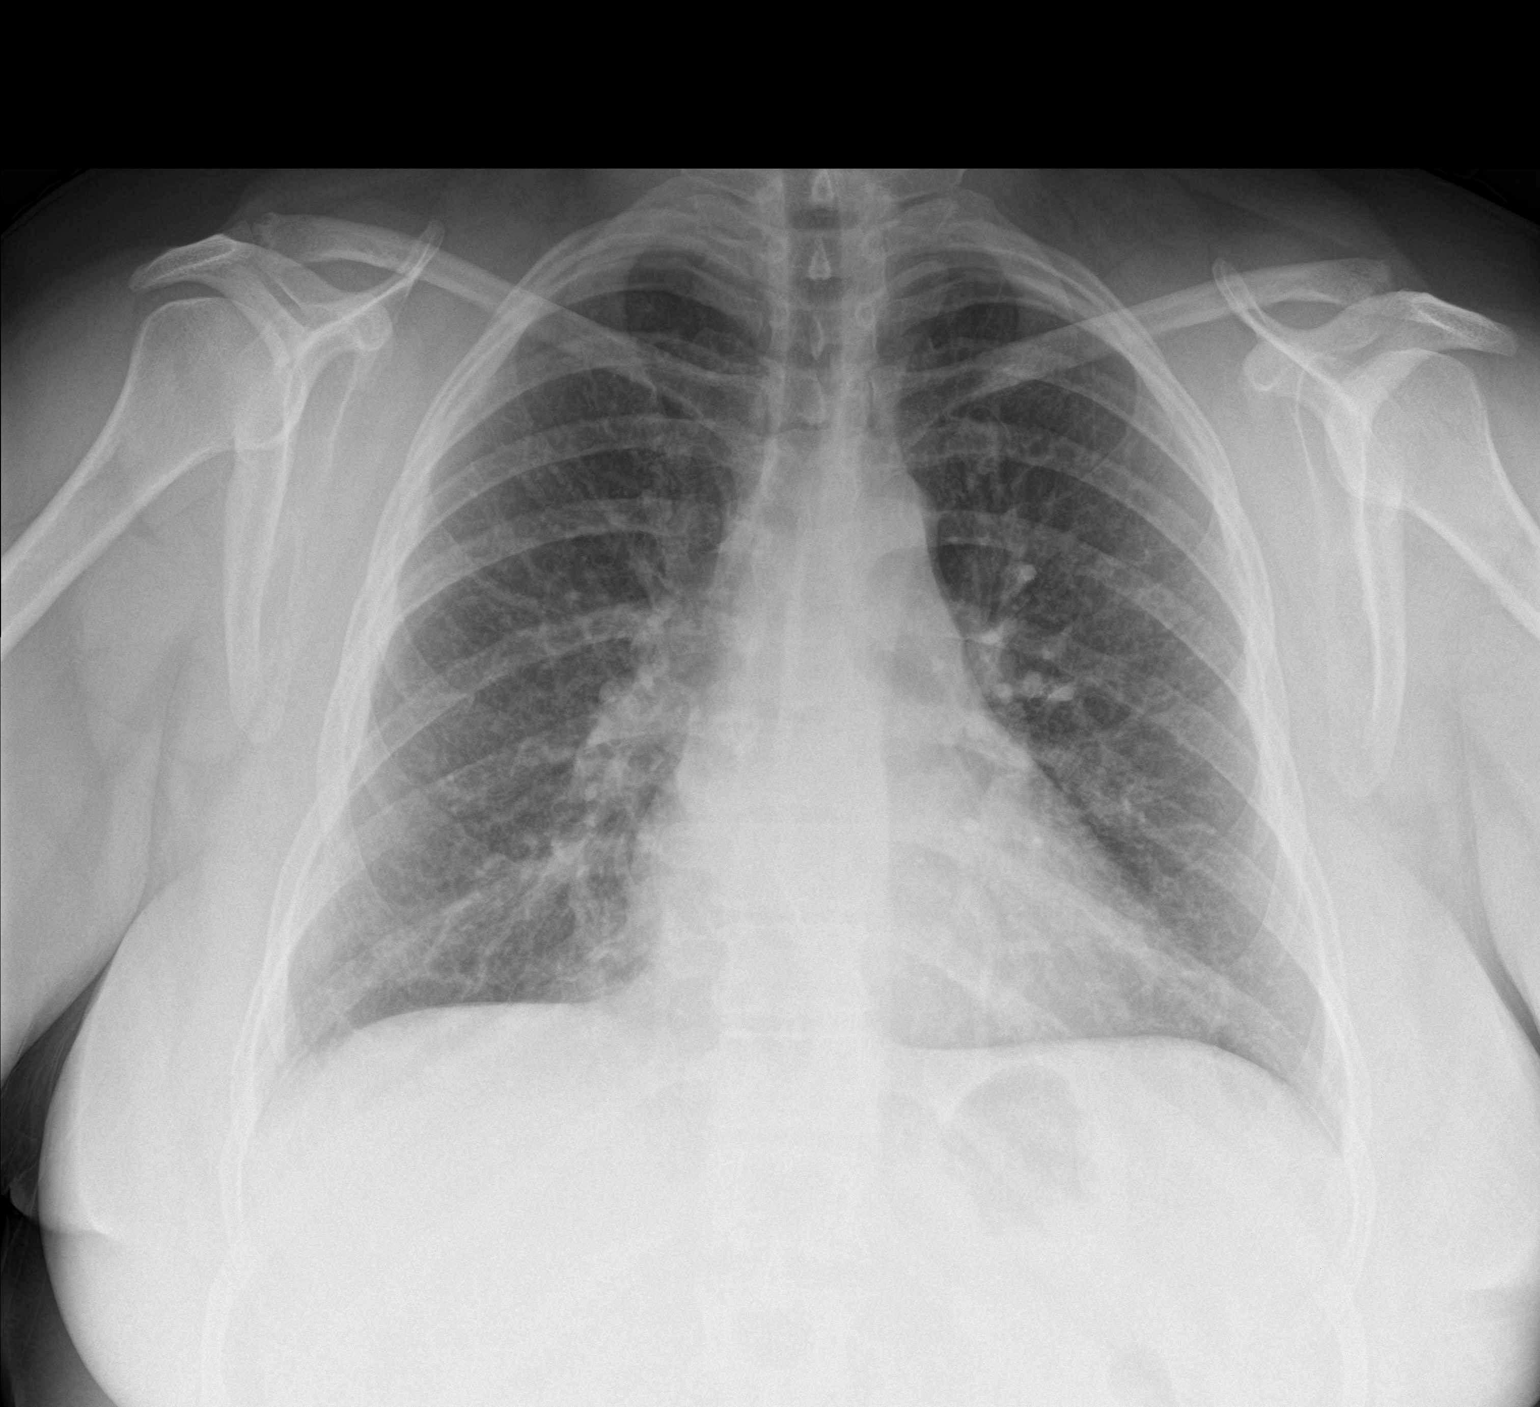

[chest lat]
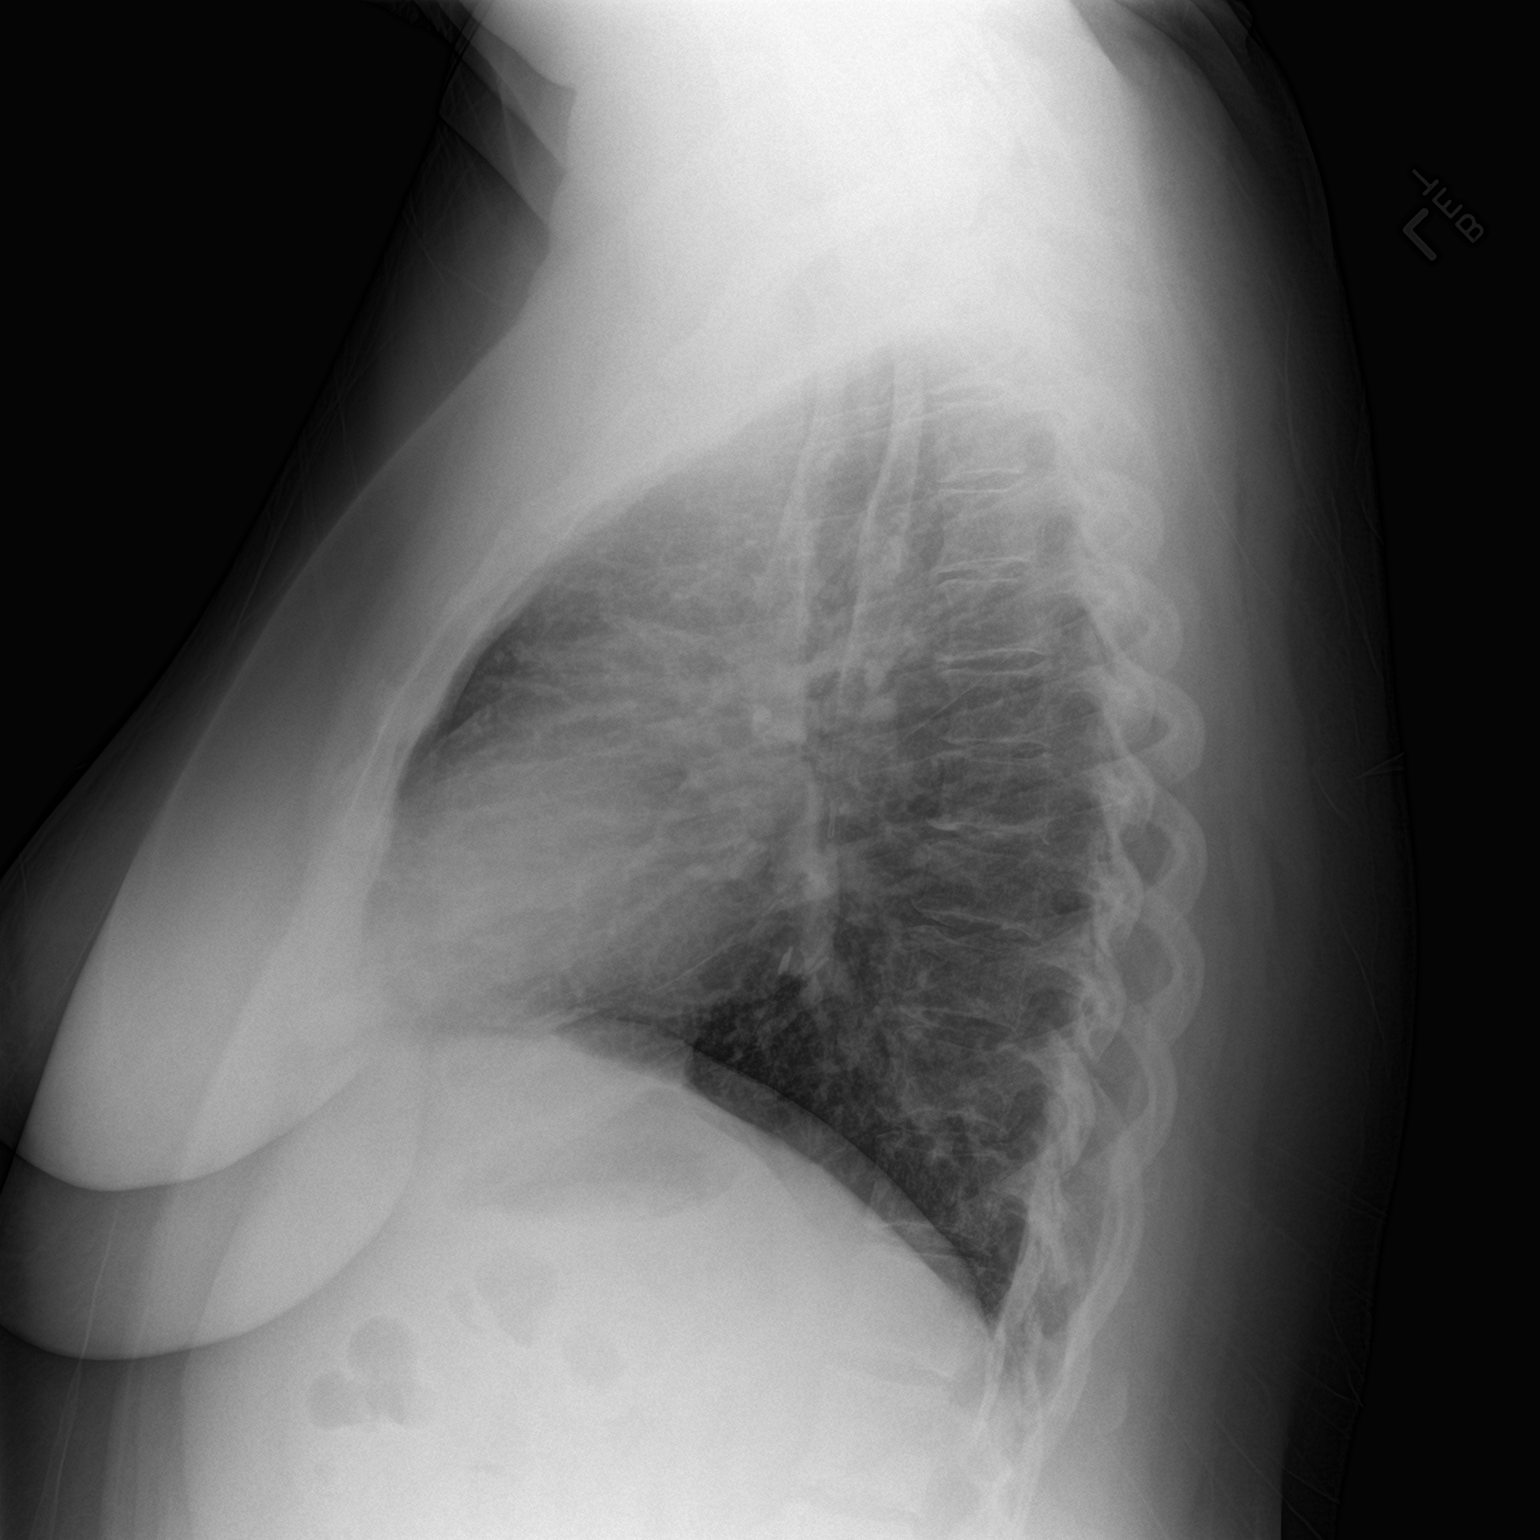

[2 of 2 positions shown; findings below may reference images not displayed]

FINDINGS: Heart size is normal. There is mild prominence of interstitial
markings, unchanged. There are no focal consolidations or pleural
effusions. No pulmonary edema. Visualized osseous structures have a
normal appearance.
IMPRESSION: No evidence for acute  abnormality.

## 2015-04-18 DIAGNOSIS — J449 Chronic obstructive pulmonary disease, unspecified: Secondary | ICD-10-CM

## 2015-04-18 DIAGNOSIS — J4489 Other specified chronic obstructive pulmonary disease: Secondary | ICD-10-CM

## 2015-04-18 HISTORY — DX: Chronic obstructive pulmonary disease, unspecified: J44.9

## 2015-04-18 HISTORY — DX: Other specified chronic obstructive pulmonary disease: J44.89

## 2015-05-06 ENCOUNTER — Encounter (HOSPITAL_COMMUNITY): Payer: Self-pay | Admitting: Unknown Physician Specialty

## 2015-05-06 ENCOUNTER — Inpatient Hospital Stay (HOSPITAL_COMMUNITY): Payer: 59

## 2015-05-06 ENCOUNTER — Inpatient Hospital Stay (HOSPITAL_COMMUNITY)
Admission: AD | Admit: 2015-05-06 | Discharge: 2015-05-13 | DRG: 208 | Disposition: A | Discharge status: Home / Self Care | Payer: 59 | Source: Ambulatory Visit | Attending: Pulmonary Disease | Admitting: Pulmonary Disease

## 2015-05-06 DIAGNOSIS — J80 Acute respiratory distress syndrome: Secondary | ICD-10-CM | POA: Diagnosis not present

## 2015-05-06 DIAGNOSIS — R001 Bradycardia, unspecified: Secondary | ICD-10-CM | POA: Diagnosis present

## 2015-05-06 DIAGNOSIS — F419 Anxiety disorder, unspecified: Secondary | ICD-10-CM | POA: Diagnosis present

## 2015-05-06 DIAGNOSIS — J45901 Unspecified asthma with (acute) exacerbation: Secondary | ICD-10-CM | POA: Diagnosis not present

## 2015-05-06 DIAGNOSIS — E039 Hypothyroidism, unspecified: Secondary | ICD-10-CM | POA: Diagnosis present

## 2015-05-06 DIAGNOSIS — Z818 Family history of other mental and behavioral disorders: Secondary | ICD-10-CM | POA: Diagnosis not present

## 2015-05-06 DIAGNOSIS — K219 Gastro-esophageal reflux disease without esophagitis: Secondary | ICD-10-CM | POA: Diagnosis present

## 2015-05-06 DIAGNOSIS — M1611 Unilateral primary osteoarthritis, right hip: Secondary | ICD-10-CM | POA: Diagnosis present

## 2015-05-06 DIAGNOSIS — J440 Chronic obstructive pulmonary disease with acute lower respiratory infection: Principal | ICD-10-CM | POA: Diagnosis present

## 2015-05-06 DIAGNOSIS — Z803 Family history of malignant neoplasm of breast: Secondary | ICD-10-CM | POA: Diagnosis not present

## 2015-05-06 DIAGNOSIS — F17210 Nicotine dependence, cigarettes, uncomplicated: Secondary | ICD-10-CM | POA: Diagnosis present

## 2015-05-06 DIAGNOSIS — J189 Pneumonia, unspecified organism: Secondary | ICD-10-CM | POA: Diagnosis present

## 2015-05-06 DIAGNOSIS — E119 Type 2 diabetes mellitus without complications: Secondary | ICD-10-CM | POA: Diagnosis present

## 2015-05-06 DIAGNOSIS — C20 Malignant neoplasm of rectum: Secondary | ICD-10-CM | POA: Diagnosis present

## 2015-05-06 DIAGNOSIS — R51 Headache: Secondary | ICD-10-CM | POA: Diagnosis not present

## 2015-05-06 DIAGNOSIS — J9690 Respiratory failure, unspecified, unspecified whether with hypoxia or hypercapnia: Secondary | ICD-10-CM

## 2015-05-06 DIAGNOSIS — R404 Transient alteration of awareness: Secondary | ICD-10-CM

## 2015-05-06 DIAGNOSIS — Z01818 Encounter for other preprocedural examination: Secondary | ICD-10-CM

## 2015-05-06 DIAGNOSIS — R0602 Shortness of breath: Secondary | ICD-10-CM | POA: Diagnosis present

## 2015-05-06 DIAGNOSIS — Z811 Family history of alcohol abuse and dependence: Secondary | ICD-10-CM | POA: Diagnosis not present

## 2015-05-06 DIAGNOSIS — Z9119 Patient's noncompliance with other medical treatment and regimen: Secondary | ICD-10-CM | POA: Diagnosis not present

## 2015-05-06 DIAGNOSIS — J9600 Acute respiratory failure, unspecified whether with hypoxia or hypercapnia: Secondary | ICD-10-CM | POA: Diagnosis present

## 2015-05-06 DIAGNOSIS — J441 Chronic obstructive pulmonary disease with (acute) exacerbation: Secondary | ICD-10-CM | POA: Diagnosis present

## 2015-05-06 DIAGNOSIS — G4733 Obstructive sleep apnea (adult) (pediatric): Secondary | ICD-10-CM | POA: Diagnosis not present

## 2015-05-06 DIAGNOSIS — Z8709 Personal history of other diseases of the respiratory system: Secondary | ICD-10-CM | POA: Diagnosis present

## 2015-05-06 DIAGNOSIS — J4489 Other specified chronic obstructive pulmonary disease: Secondary | ICD-10-CM | POA: Diagnosis present

## 2015-05-06 DIAGNOSIS — J449 Chronic obstructive pulmonary disease, unspecified: Secondary | ICD-10-CM | POA: Diagnosis present

## 2015-05-06 DIAGNOSIS — Z8673 Personal history of transient ischemic attack (TIA), and cerebral infarction without residual deficits: Secondary | ICD-10-CM

## 2015-05-06 HISTORY — DX: Prediabetes: R73.03

## 2015-05-06 HISTORY — DX: Personal history of pneumonia (recurrent): Z87.01

## 2015-05-06 LAB — BLOOD GAS, ARTERIAL
ACID-BASE DEFICIT: 2 mmol/L (ref 0.0–2.0)
BICARBONATE: 24.3 meq/L — AB (ref 20.0–24.0)
DRAWN BY: 277331
O2 Content: 2 L/min
O2 Saturation: 98.1 %
PATIENT TEMPERATURE: 37
PH ART: 7.543 — AB (ref 7.350–7.450)
PO2 ART: 101 mmHg — AB (ref 80.0–100.0)
pCO2 arterial: 23.6 mmHg — ABNORMAL LOW (ref 35.0–45.0)

## 2015-05-06 LAB — COMPREHENSIVE METABOLIC PANEL
ALK PHOS: 68 U/L (ref 38–126)
ALT: 14 U/L (ref 14–54)
ANION GAP: 10 (ref 5–15)
AST: 18 U/L (ref 15–41)
Albumin: 4.3 g/dL (ref 3.5–5.0)
BUN: 13 mg/dL (ref 6–20)
CALCIUM: 9.4 mg/dL (ref 8.9–10.3)
CO2: 22 mmol/L (ref 22–32)
CREATININE: 0.69 mg/dL (ref 0.44–1.00)
Chloride: 105 mmol/L (ref 101–111)
Glucose, Bld: 100 mg/dL — ABNORMAL HIGH (ref 65–99)
Potassium: 3.5 mmol/L (ref 3.5–5.1)
SODIUM: 137 mmol/L (ref 135–145)
Total Bilirubin: 0.6 mg/dL (ref 0.3–1.2)
Total Protein: 8.1 g/dL (ref 6.5–8.1)

## 2015-05-06 LAB — CBC WITH DIFFERENTIAL/PLATELET
BASOS ABS: 0 10*3/uL (ref 0.0–0.1)
BASOS PCT: 0 %
EOS ABS: 0.1 10*3/uL (ref 0.0–0.7)
Eosinophils Relative: 1 %
HEMATOCRIT: 45.5 % (ref 36.0–46.0)
HEMOGLOBIN: 15.7 g/dL — AB (ref 12.0–15.0)
Lymphocytes Relative: 30 %
Lymphs Abs: 4.7 10*3/uL — ABNORMAL HIGH (ref 0.7–4.0)
MCH: 30.2 pg (ref 26.0–34.0)
MCHC: 34.5 g/dL (ref 30.0–36.0)
MCV: 87.5 fL (ref 78.0–100.0)
Monocytes Absolute: 1.1 10*3/uL — ABNORMAL HIGH (ref 0.1–1.0)
Monocytes Relative: 7 %
NEUTROS ABS: 9.8 10*3/uL — AB (ref 1.7–7.7)
NEUTROS PCT: 62 %
Platelets: 368 10*3/uL (ref 150–400)
RBC: 5.2 MIL/uL — AB (ref 3.87–5.11)
RDW: 13.2 % (ref 11.5–15.5)
WBC: 15.7 10*3/uL — AB (ref 4.0–10.5)

## 2015-05-06 LAB — BRAIN NATRIURETIC PEPTIDE: B NATRIURETIC PEPTIDE 5: 22 pg/mL (ref 0.0–100.0)

## 2015-05-06 LAB — MRSA PCR SCREENING: MRSA BY PCR: POSITIVE — AB

## 2015-05-06 IMAGING — CR DG CHEST 1V PORT
1 series · 1 of 1 positions shown · non-contrast
Comparison: [DATE] chest radiograph.

CLINICAL DATA: Respiratory failure.

EXAM:
PORTABLE CHEST 1 VIEW

[ap]
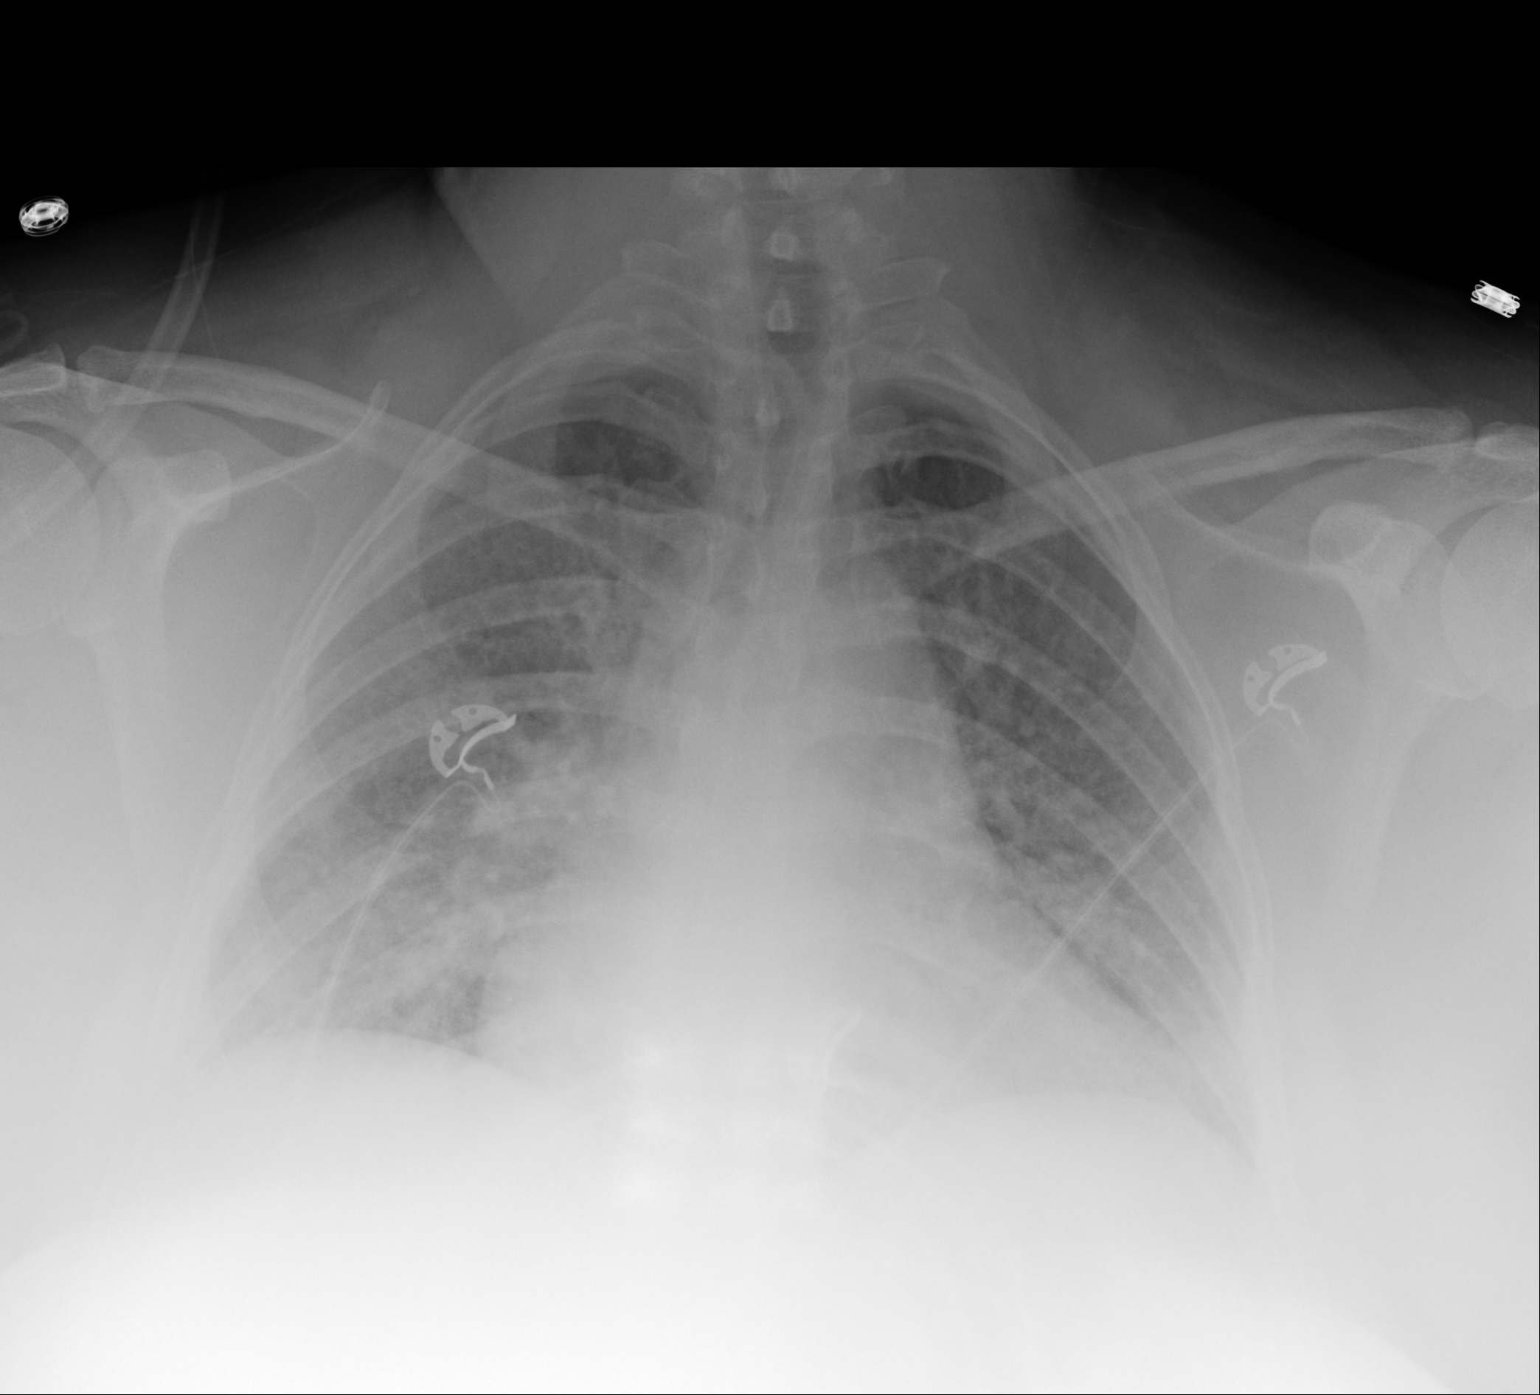

[1 of 1 positions shown; findings below may reference images not displayed]

FINDINGS: Low lung volumes. There is apparent mild enlargement of the cardiac
silhouette. The mediastinal contour is otherwise within normal
limits. No pneumothorax. No pleural effusion. There are diffuse hazy
and linear parahilar opacities bilaterally.
IMPRESSION: 1. Apparent mild enlargement of the cardiac silhouette, which could
be artifactual due to low lung volumes.
2. Diffuse hazy and linear parahilar opacities bilaterally, most
suggestive of moderate pulmonary edema. Consider correlation with
BNP and echocardiography to assess for congestive heart failure.

## 2015-05-06 MED ORDER — HYDROCODONE-ACETAMINOPHEN 5-325 MG PO TABS
1.0000 | ORAL_TABLET | ORAL | Status: DC | PRN
  Administered 2015-05-06 – 2015-05-07 (×5): 1 via ORAL
  Filled 2015-05-06 (×5): qty 1

## 2015-05-06 MED ORDER — SODIUM CHLORIDE 0.9 % IV SOLN
INTRAVENOUS | Status: DC
  Administered 2015-05-06 – 2015-05-13 (×8): via INTRAVENOUS

## 2015-05-06 MED ORDER — ONDANSETRON HCL 4 MG PO TABS
4.0000 mg | ORAL_TABLET | Freq: Four times a day (QID) | ORAL | Status: DC | PRN
Start: 2015-05-06 — End: 2015-05-13

## 2015-05-06 MED ORDER — LEVOTHYROXINE SODIUM 50 MCG PO TABS
50.0000 ug | ORAL_TABLET | Freq: Every day | ORAL | Status: DC
  Administered 2015-05-08 – 2015-05-13 (×5): 50 ug via ORAL
  Filled 2015-05-06 (×3): qty 2
  Filled 2015-05-06: qty 1
  Filled 2015-05-06: qty 2

## 2015-05-06 MED ORDER — MUPIROCIN 2 % EX OINT
1.0000 "application " | TOPICAL_OINTMENT | Freq: Two times a day (BID) | CUTANEOUS | Status: AC
  Administered 2015-05-06 – 2015-05-11 (×9): 1 via NASAL
  Filled 2015-05-06: qty 22

## 2015-05-06 MED ORDER — DEXTROSE 5 % IV SOLN
500.0000 mg | INTRAVENOUS | Status: DC
  Administered 2015-05-06 – 2015-05-11 (×6): 500 mg via INTRAVENOUS
  Filled 2015-05-06 (×9): qty 500

## 2015-05-06 MED ORDER — ACETAMINOPHEN 325 MG PO TABS: 650.0000 mg | ORAL_TABLET | Freq: Four times a day (QID) | ORAL | Status: DC | PRN

## 2015-05-06 MED ORDER — IPRATROPIUM BROMIDE 0.02 % IN SOLN: 0.5000 mg | Freq: Four times a day (QID) | RESPIRATORY_TRACT | Status: DC

## 2015-05-06 MED ORDER — ALUM & MAG HYDROXIDE-SIMETH 200-200-20 MG/5ML PO SUSP
30.0000 mL | Freq: Four times a day (QID) | ORAL | Status: DC | PRN
  Administered 2015-05-08: 30 mL via ORAL
  Filled 2015-05-06: qty 30

## 2015-05-06 MED ORDER — IPRATROPIUM BROMIDE 0.02 % IN SOLN
RESPIRATORY_TRACT | Status: AC
  Administered 2015-05-06: 0.5 mg
  Filled 2015-05-06: qty 2.5

## 2015-05-06 MED ORDER — POLYETHYLENE GLYCOL 3350 17 G PO PACK: 17.0000 g | PACK | Freq: Every day | ORAL | Status: DC | PRN

## 2015-05-06 MED ORDER — ALBUTEROL SULFATE (2.5 MG/3ML) 0.083% IN NEBU
INHALATION_SOLUTION | RESPIRATORY_TRACT | Status: AC
  Administered 2015-05-06: 2.5 mg
  Filled 2015-05-06: qty 3

## 2015-05-06 MED ORDER — DEXTROSE 5 % IV SOLN
1.0000 g | INTRAVENOUS | Status: DC
  Administered 2015-05-06 – 2015-05-12 (×7): 1 g via INTRAVENOUS
  Filled 2015-05-06 (×8): qty 10

## 2015-05-06 MED ORDER — ENOXAPARIN SODIUM 40 MG/0.4ML ~~LOC~~ SOLN
40.0000 mg | SUBCUTANEOUS | Status: DC
  Administered 2015-05-08 – 2015-05-10 (×3): 40 mg via SUBCUTANEOUS
  Filled 2015-05-06 (×6): qty 0.4

## 2015-05-06 MED ORDER — ALBUTEROL SULFATE (2.5 MG/3ML) 0.083% IN NEBU: 2.5000 mg | INHALATION_SOLUTION | Freq: Four times a day (QID) | RESPIRATORY_TRACT | Status: DC

## 2015-05-06 MED ORDER — ACETAMINOPHEN 650 MG RE SUPP: 650.0000 mg | Freq: Four times a day (QID) | RECTAL | Status: DC | PRN

## 2015-05-06 MED ORDER — ALBUTEROL SULFATE (2.5 MG/3ML) 0.083% IN NEBU
2.5000 mg | INHALATION_SOLUTION | RESPIRATORY_TRACT | Status: DC | PRN
  Administered 2015-05-08: 2.5 mg via RESPIRATORY_TRACT
  Filled 2015-05-06: qty 3

## 2015-05-06 MED ORDER — ONDANSETRON HCL 4 MG/2ML IJ SOLN
4.0000 mg | Freq: Four times a day (QID) | INTRAMUSCULAR | Status: DC | PRN
  Administered 2015-05-08 – 2015-05-11 (×3): 4 mg via INTRAVENOUS
  Filled 2015-05-06 (×4): qty 2

## 2015-05-06 MED ORDER — CHLORHEXIDINE GLUCONATE CLOTH 2 % EX PADS
6.0000 | MEDICATED_PAD | Freq: Every day | CUTANEOUS | Status: DC
  Administered 2015-05-08 – 2015-05-11 (×4): 6 via TOPICAL

## 2015-05-06 MED ORDER — ALPRAZOLAM 0.5 MG PO TABS
1.0000 mg | ORAL_TABLET | Freq: Two times a day (BID) | ORAL | Status: DC
  Administered 2015-05-06 – 2015-05-07 (×3): 1 mg via ORAL
  Filled 2015-05-06 (×3): qty 2

## 2015-05-06 MED ORDER — IPRATROPIUM-ALBUTEROL 0.5-2.5 (3) MG/3ML IN SOLN
3.0000 mL | Freq: Four times a day (QID) | RESPIRATORY_TRACT | Status: DC
  Administered 2015-05-06 – 2015-05-12 (×25): 3 mL via RESPIRATORY_TRACT
  Filled 2015-05-06 (×25): qty 3

## 2015-05-06 MED ORDER — METHYLPREDNISOLONE SODIUM SUCC 125 MG IJ SOLR
80.0000 mg | Freq: Four times a day (QID) | INTRAMUSCULAR | Status: DC
  Administered 2015-05-06 – 2015-05-12 (×25): 80 mg via INTRAVENOUS
  Filled 2015-05-06 (×25): qty 2

## 2015-05-06 MED ORDER — GUAIFENESIN ER 600 MG PO TB12
600.0000 mg | ORAL_TABLET | Freq: Two times a day (BID) | ORAL | Status: DC
  Administered 2015-05-06 – 2015-05-07 (×3): 600 mg via ORAL
  Filled 2015-05-06 (×3): qty 1

## 2015-05-07 ENCOUNTER — Inpatient Hospital Stay (HOSPITAL_COMMUNITY): Payer: 59

## 2015-05-07 ENCOUNTER — Encounter (HOSPITAL_COMMUNITY): Payer: Self-pay | Admitting: Cardiology

## 2015-05-07 DIAGNOSIS — G4733 Obstructive sleep apnea (adult) (pediatric): Secondary | ICD-10-CM

## 2015-05-07 DIAGNOSIS — J80 Acute respiratory distress syndrome: Secondary | ICD-10-CM

## 2015-05-07 DIAGNOSIS — R001 Bradycardia, unspecified: Secondary | ICD-10-CM | POA: Diagnosis present

## 2015-05-07 DIAGNOSIS — J189 Pneumonia, unspecified organism: Secondary | ICD-10-CM

## 2015-05-07 DIAGNOSIS — J45901 Unspecified asthma with (acute) exacerbation: Secondary | ICD-10-CM

## 2015-05-07 HISTORY — PX: TRANSTHORACIC ECHOCARDIOGRAM: SHX275

## 2015-05-07 HISTORY — DX: Bradycardia, unspecified: R00.1

## 2015-05-07 LAB — BASIC METABOLIC PANEL
Anion gap: 10 (ref 5–15)
BUN: 8 mg/dL (ref 6–20)
CO2: 20 mmol/L — ABNORMAL LOW (ref 22–32)
Calcium: 9.2 mg/dL (ref 8.9–10.3)
Chloride: 106 mmol/L (ref 101–111)
Creatinine, Ser: 0.64 mg/dL (ref 0.44–1.00)
GFR calc Af Amer: >60 mL/min (ref >60)
GLUCOSE: 155 mg/dL — AB (ref 65–99)
POTASSIUM: 4.1 mmol/L (ref 3.5–5.1)
Sodium: 136 mmol/L (ref 135–145)

## 2015-05-07 LAB — TSH: TSH: 2.536 u[IU]/mL (ref 0.350–4.500)

## 2015-05-07 LAB — CBC
HEMATOCRIT: 42.2 % (ref 36.0–46.0)
Hemoglobin: 14.1 g/dL (ref 12.0–15.0)
MCH: 29.9 pg (ref 26.0–34.0)
MCHC: 33.4 g/dL (ref 30.0–36.0)
MCV: 89.4 fL (ref 78.0–100.0)
Platelets: 314 10*3/uL (ref 150–400)
RBC: 4.72 MIL/uL (ref 3.87–5.11)
RDW: 13.4 % (ref 11.5–15.5)
WBC: 17.3 10*3/uL — ABNORMAL HIGH (ref 4.0–10.5)

## 2015-05-07 LAB — BLOOD GAS, ARTERIAL
Acid-base deficit: 2.7 mmol/L — ABNORMAL HIGH (ref 0.0–2.0)
Bicarbonate: 22.3 mEq/L (ref 20.0–24.0)
Delivery systems: POSITIVE
Expiratory PAP: 5
FIO2: 30
INSPIRATORY PAP: 10
O2 Saturation: 97.9 %
PO2 ART: 115 mmHg — AB (ref 80.0–100.0)
pCO2 arterial: 37.4 mmHg (ref 35.0–45.0)
pH, Arterial: 7.379 (ref 7.350–7.450)

## 2015-05-07 LAB — GLUCOSE, CAPILLARY: GLUCOSE-CAPILLARY: 183 mg/dL — AB (ref 65–99)

## 2015-05-07 LAB — RAPID URINE DRUG SCREEN, HOSP PERFORMED
AMPHETAMINES: NOT DETECTED
Barbiturates: NOT DETECTED
Benzodiazepines: POSITIVE — AB
Cocaine: NOT DETECTED
Opiates: POSITIVE — AB
TETRAHYDROCANNABINOL: NOT DETECTED

## 2015-05-07 LAB — LACTIC ACID, PLASMA
Lactic Acid, Venous: 3.8 mmol/L (ref 0.5–2.0)
Lactic Acid, Venous: 3.1 mmol/L (ref 0.5–2.0)
Lactic Acid, Venous: 2.7 mmol/L (ref 0.5–2.0)

## 2015-05-07 LAB — AMMONIA: AMMONIA: 27 umol/L (ref 9–35)

## 2015-05-07 IMAGING — CT CT HEAD W/O CM
1 of 2 series · 16 of 30 positions shown, 20 images · non-contrast
Comparison: [DATE]

CLINICAL DATA: Altered mental status

EXAM:
CT HEAD WITHOUT CONTRAST
TECHNIQUE: Contiguous axial images were obtained from the base of the skull
through the vertex without intravenous contrast.

[Series 2: headseq 4.8 h37s · axial · 0.43mm/px · z∈[+82,+230]mm · 16 of 32 slices shown, 20 images]
[im 2/32  brain]
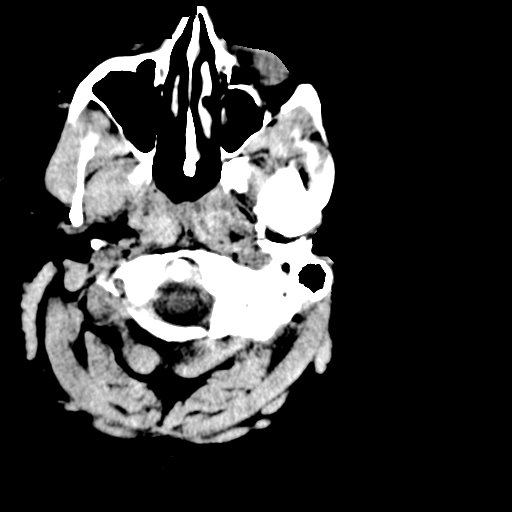
[im 2/32  bone]
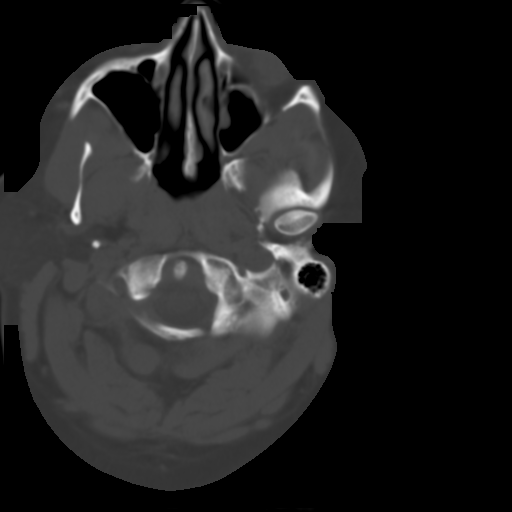
[im 4/32  brain]
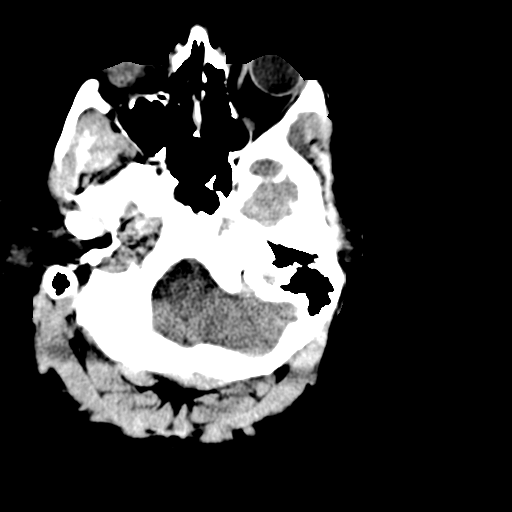
[im 6/32  brain]
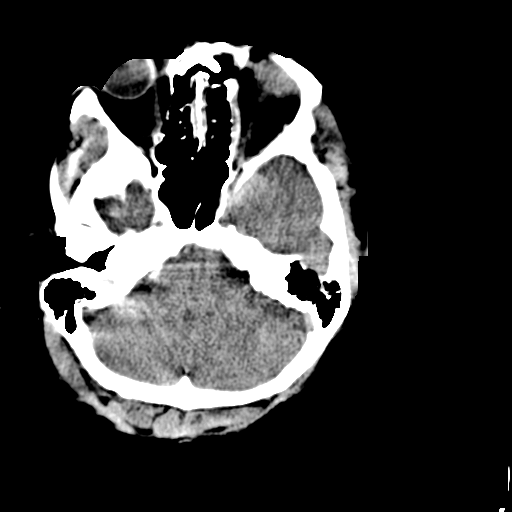
[im 7/32  brain]
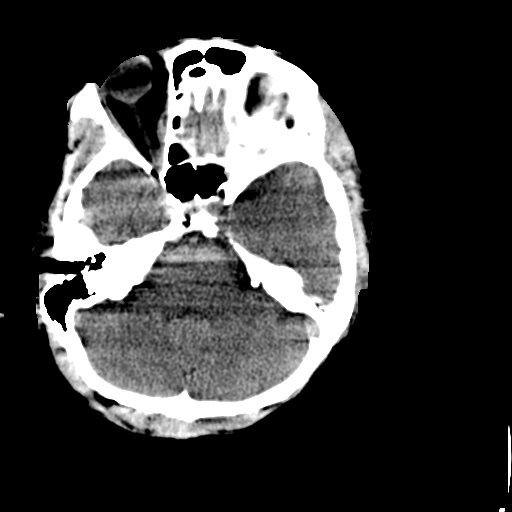
[im 9/32  brain]
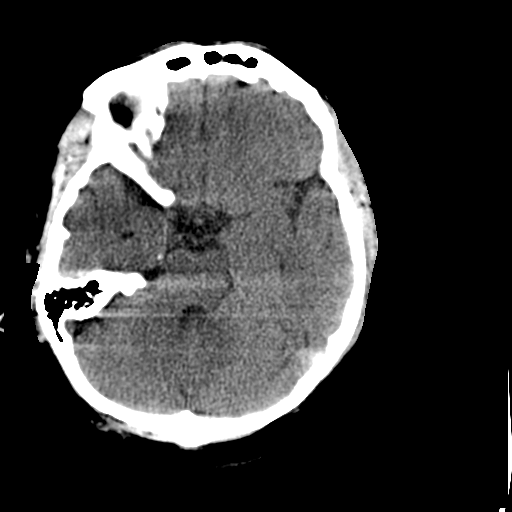
[im 9/32  bone]
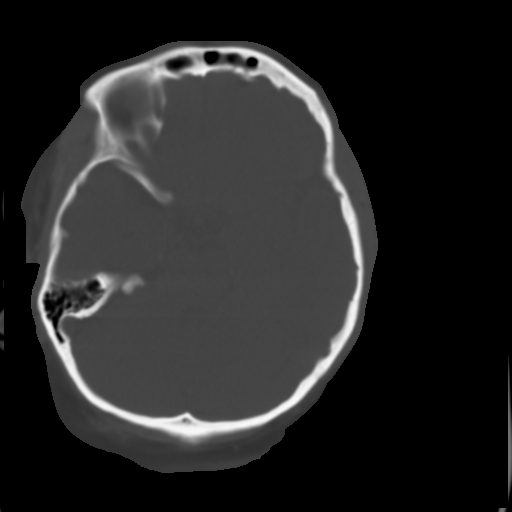
[im 11/32  brain]
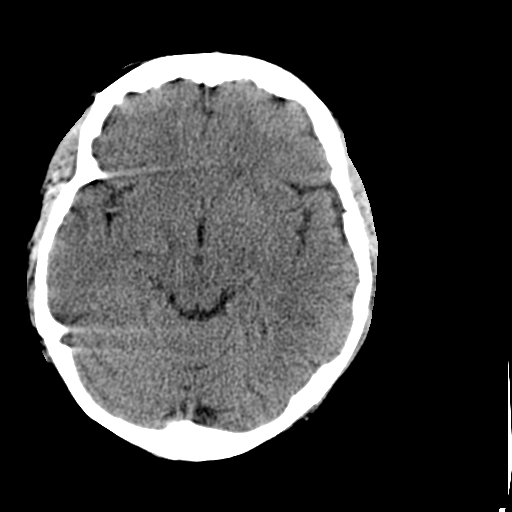
[im 13/32  brain]
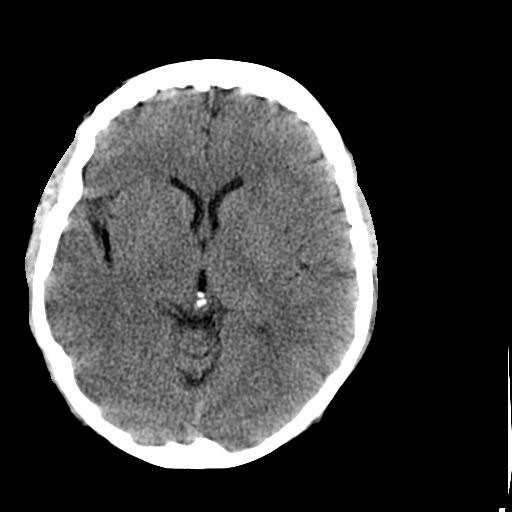
[im 14/32  brain]
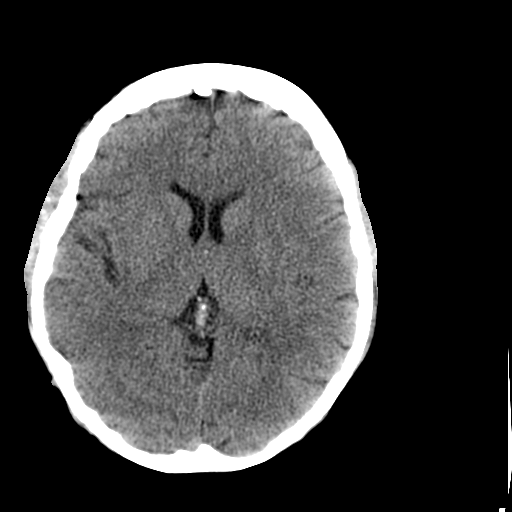
[im 18/32  brain]
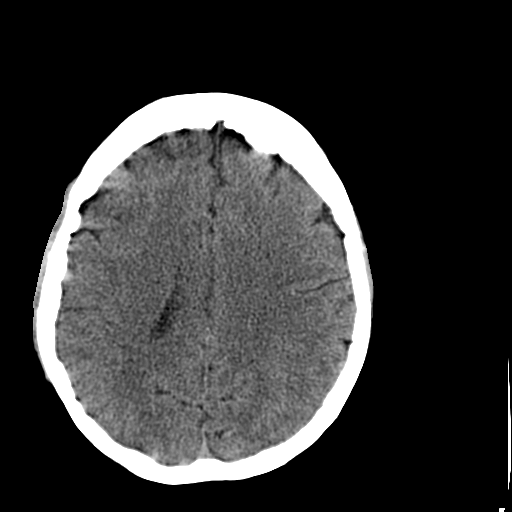
[im 18/32  bone]
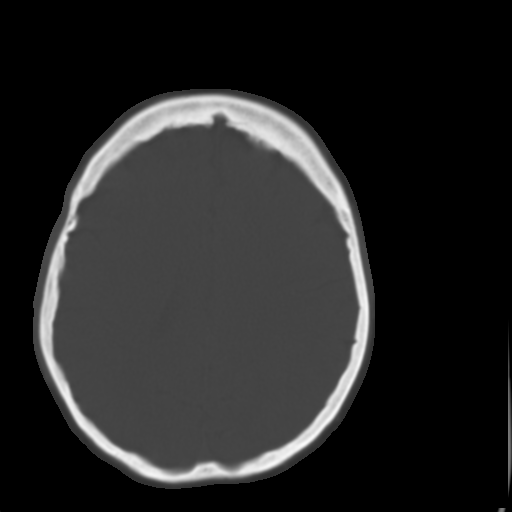
[im 19/32  brain]
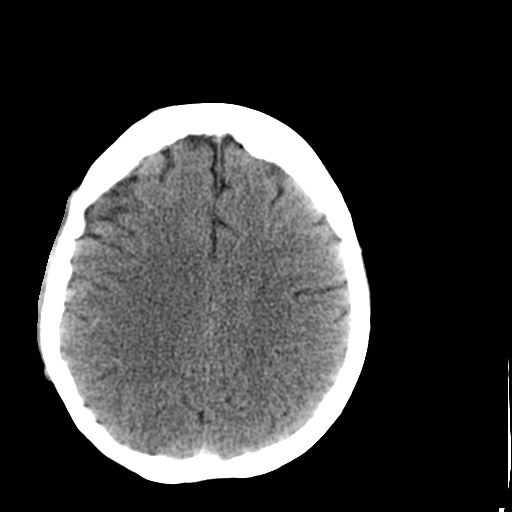
[im 21/32  brain]
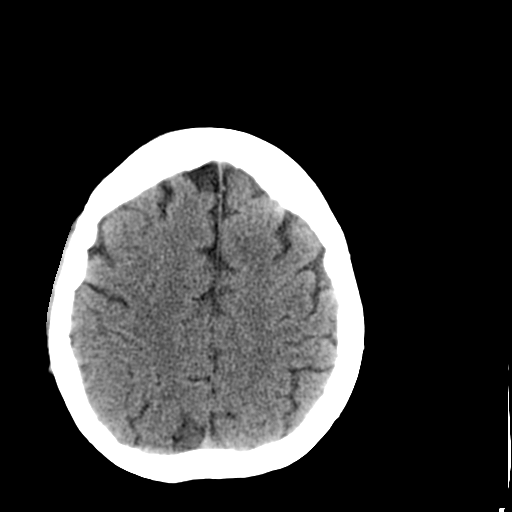
[im 23/32  brain]
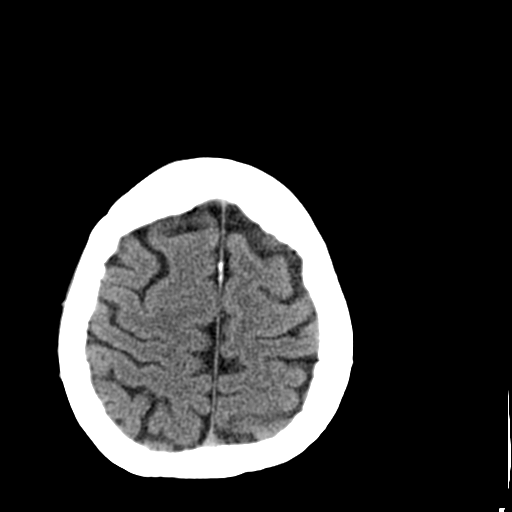
[im 25/32  brain]
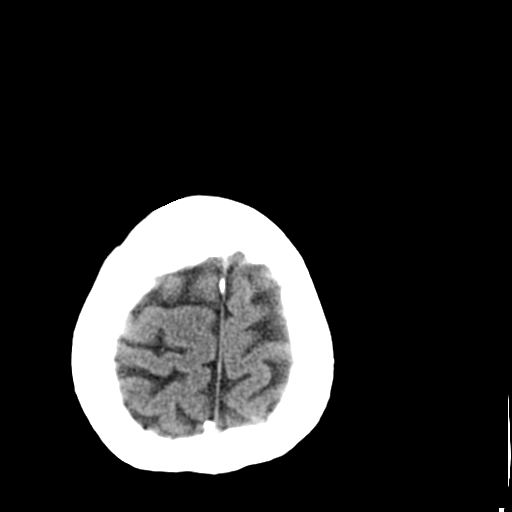
[im 25/32  bone]
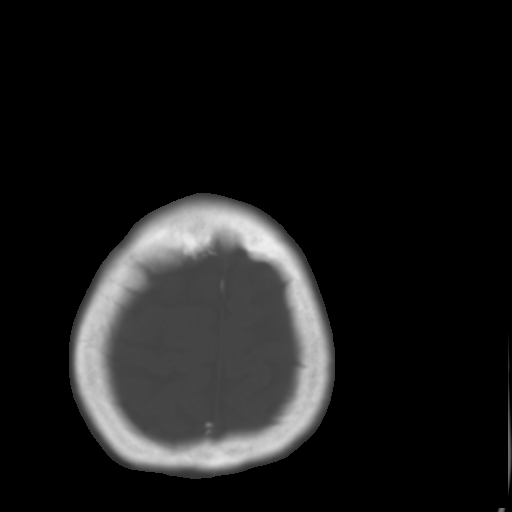
[im 26/32  brain]
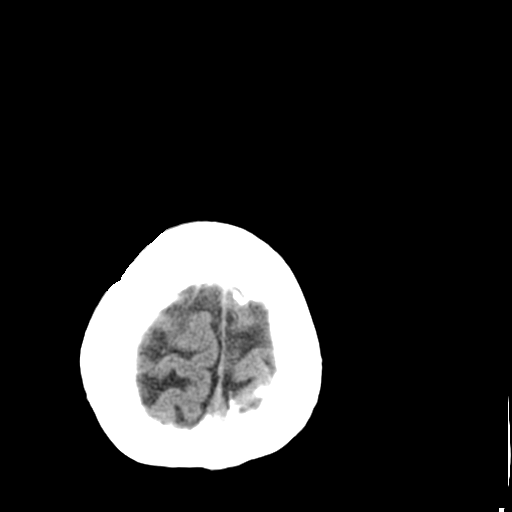
[im 28/32  brain]
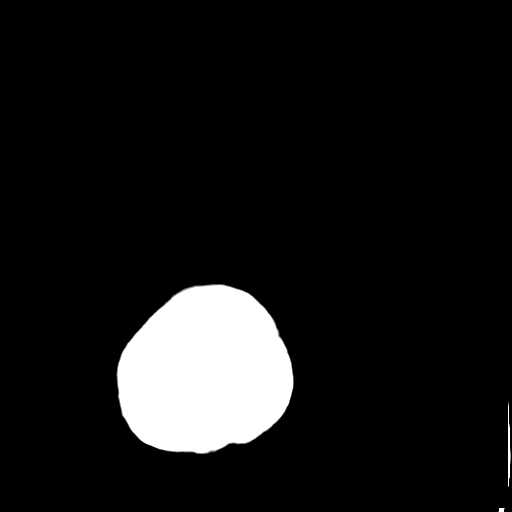
[im 30/32  brain]
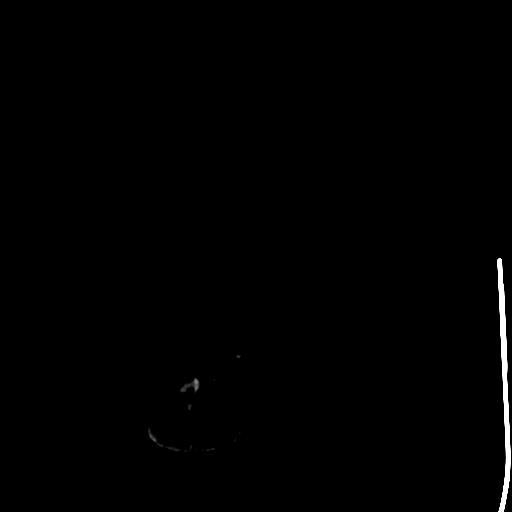

[16 of 30 positions shown; findings below may reference images not displayed]

FINDINGS: The ventricles are normal in size and configuration. There is no
intracranial mass, hemorrhage, extra-axial fluid collection, or
midline shift. Gray-white compartments are normal. No acute infarct
evident. The bony calvarium appears intact. The mastoid air cells
are clear. There are no intraorbital lesions.
IMPRESSION: Study within normal limits and stable.

## 2015-05-07 IMAGING — CT CT ANGIO CHEST
1 of 6 series · 5 of 36 positions shown · IV contrast (Omnipaque 300)
Comparison: Plain film of the chest [DATE]. CT chest
[DATE].

CLINICAL DATA: COPD exacerbation. Productive cough. Lower extremity
pain and swelling.

EXAM:
CT ANGIOGRAPHY CHEST WITH CONTRAST
TECHNIQUE: Multidetector CT imaging of the chest was performed using the
standard protocol during bolus administration of intravenous
contrast. Multiplanar CT image reconstructions and MIPs were
obtained to evaluate the vascular anatomy.
CONTRAST:  100 mL OMNIPAQUE IOHEXOL 350 MG/ML SOLN

[Series 4: pe 3.0 b40f · axial · 0.65mm/px · z∈[-254,-88]mm · 5 of 83 slices shown]
[im 14/83  lung]
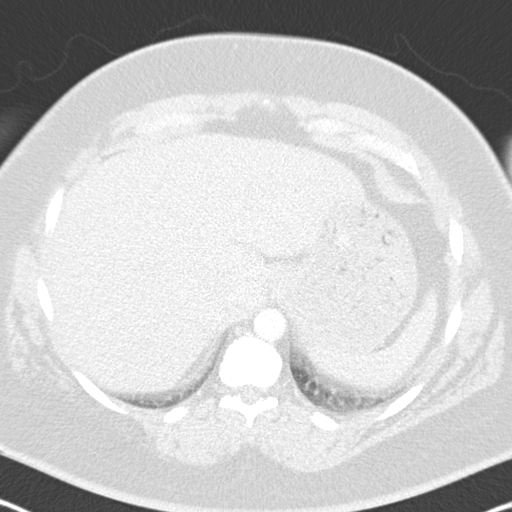
[im 28/83  mediastinal]
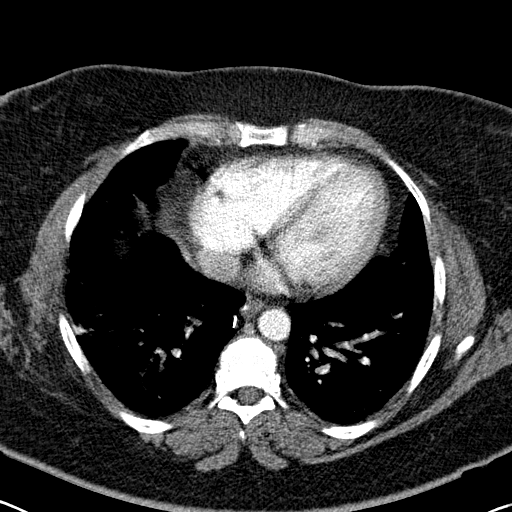
[im 42/83  lung]
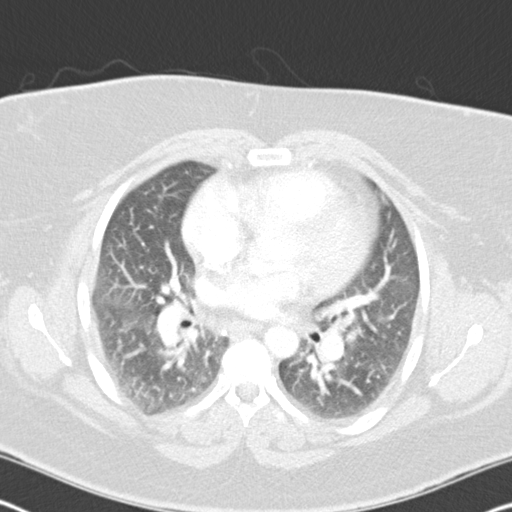
[im 55/83  mediastinal]
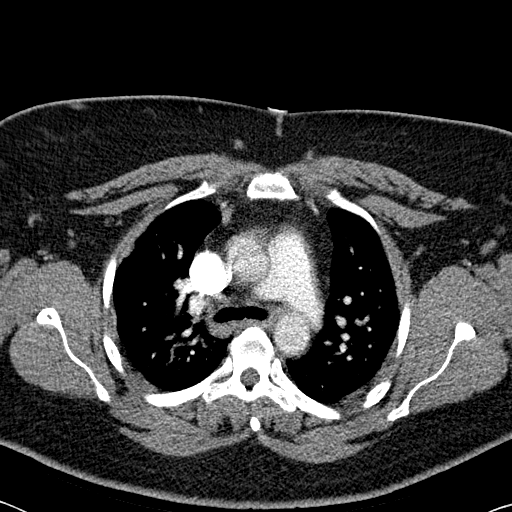
[im 69/83  lung]
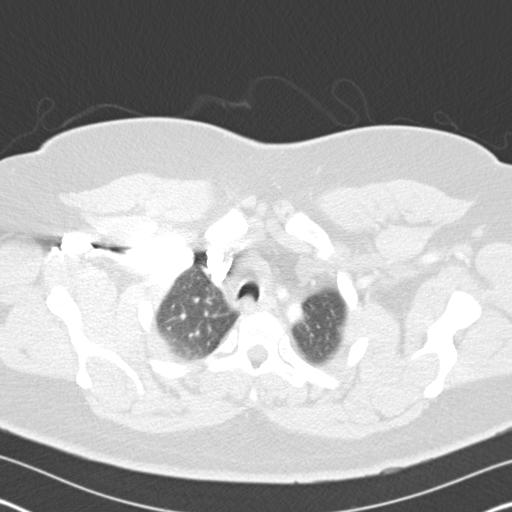

[5 of 36 positions shown; findings below may reference images not displayed]

FINDINGS: The study is somewhat degraded by respiratory motion and bolus
timing. No pulmonary embolus is identified. There is mild
cardiomegaly. No pleural or pericardial effusion. Surgical clips
along the anterior margin of the distal esophagus are noted. There
is no axillary, hilar or mediastinal lymphadenopathy. The lungs
demonstrate dependent atelectasis. Scattered ground-glass
attenuation is also identified.

Visualized upper abdomen shows no focal abnormality. No focal bony
abnormality is identified.

Review of the MIP images confirms the above findings.
IMPRESSION: Negative for pulmonary embolus.

Cardiomegaly.

Scattered ground-glass attenuation in the lungs could be due to
atelectasis or possibly mild interstitial edema.

## 2015-05-07 MED ORDER — NALOXONE HCL 0.4 MG/ML IJ SOLN
INTRAMUSCULAR | Status: AC
  Administered 2015-05-07: 0.4 mg
  Filled 2015-05-07: qty 1

## 2015-05-07 MED ORDER — TRAMADOL HCL 50 MG PO TABS
50.0000 mg | ORAL_TABLET | Freq: Four times a day (QID) | ORAL | Status: DC | PRN
  Administered 2015-05-07 – 2015-05-08 (×2): 50 mg via ORAL
  Filled 2015-05-07 (×2): qty 1

## 2015-05-07 MED ORDER — ALBUTEROL (5 MG/ML) CONTINUOUS INHALATION SOLN
10.0000 mg/h | INHALATION_SOLUTION | RESPIRATORY_TRACT | Status: DC
  Administered 2015-05-07: 10 mg/h via RESPIRATORY_TRACT

## 2015-05-07 MED ORDER — IOHEXOL 350 MG/ML SOLN
100.0000 mL | Freq: Once | INTRAVENOUS | Status: AC | PRN
  Administered 2015-05-07: 100 mL via INTRAVENOUS

## 2015-05-07 MED ORDER — ALPRAZOLAM 0.5 MG PO TABS
0.5000 mg | ORAL_TABLET | Freq: Two times a day (BID) | ORAL | Status: DC
  Administered 2015-05-07: 0.5 mg via ORAL
  Filled 2015-05-07: qty 1

## 2015-05-07 MED ORDER — ALBUTEROL (5 MG/ML) CONTINUOUS INHALATION SOLN
INHALATION_SOLUTION | RESPIRATORY_TRACT | Status: AC
  Filled 2015-05-07: qty 20

## 2015-05-07 MED ORDER — HYDROCOD POLST-CPM POLST ER 10-8 MG/5ML PO SUER
5.0000 mL | Freq: Two times a day (BID) | ORAL | Status: DC | PRN
  Filled 2015-05-07: qty 5

## 2015-05-07 NOTE — Progress Notes (Signed)
Lactic acid 3.1 Result improving

## 2015-05-07 NOTE — Progress Notes (Signed)
Narcan 0.4mg  IV given per protocol. Patients remains unresponsive. ABG/Head CT ordered STAT by Dr. Luan Pulling.

## 2015-05-07 NOTE — Progress Notes (Signed)
Overheard patient yelling at mother and boyfriend at the bedside. Mother stated "I'm taking them home" patient response was "give them back!". Went into room and asked if there was a problem. Patient stated "I'm going home, [place] papers now please". Tried to explain that she is very sick and we almost intubated her a little bit ago. She stated that she "probably shouldn't" go home but asked for [facility] papers again. Patient then told her mother to leave the facility and "Go home and take care of my son". Mother left visibly upset. Primary nurse notified of patients actions and AC present to try to de-escalate the situation.

## 2015-05-07 NOTE — Progress Notes (Signed)
I/O cath done, patient is now awake, shivering and coughing. Family is present. CRNA is at bedside assessing the patient. Patient now informed of events that occurred, Dr. Luan Pulling paged to update him on patients change of status again.

## 2015-05-07 NOTE — H&P (Signed)
Denise Ray MRN: [id number] DOB/AGE: 1970-07-06 44 y.o. Primary Care Physician:Seriah Brotzman L, MD Admit date: 05/06/2015 Chief Complaint: Respiratory failure HPI: This is documentation of history and physical performed in my office on 05/06/2015. She has been being treated for COPD exacerbation and has worsened. She appears to be having acute respiratory failure. She is very short of breath. She is coughing nonproductively. She has some chest pain that is related to her cough. She is coughing up a little bit of sputum. She has a history of asthma/COPD at baseline. She also has a history of obstructive sleep apnea and does not use her CPAP. She has a previous history of respiratory failure requiring ventilator support.  Past Medical History  Diagnosis Date  . GERD (gastroesophageal reflux disease)   . Migraines   . Arthritis     right hip  . History of TIA (transient ischemic attack)     caused by medication reaction  . Asthma     daily inhaler use  . Anxiety   . Hemorrhoids 05/2014  . Chronic pain     widespread, per pt.  . Hypothyroidism        . OSA (obstructive sleep apnea)   . Family history of adverse reaction to anesthesia     pt's mother has hx. of post-op N/V  . Diabetes mellitus without complication ([place])     borderline diabetic  . Pneumonia 6/15    pt hospitalized and ventilated d/t atypical pneumonia caused by asthma exacerbation  . Mass of lung     hx of lung wall mass  . Complication of anesthesia     ? urinary retention  . Abnormally small mouth    Past Surgical History  Procedure Laterality Date  . Conization of cervix  2000  . Hysteroscopy w/d&c  09/12/2007  &  2005  . Chest wall/lung mass resection  7/03  . Robotic assisted total hysterectomy N/A 10/11/2012    Procedure: ROBOTIC ASSISTED TOTAL HYSTERECTOMY;  Surgeon: Lyman Speller, MD;  Location: [facility];  Service: Gynecology;  Laterality: N/A;  . Salpingoophorectomy Bilateral 10/11/2012    Procedure:  SALPINGO OOPHORECTOMY;  Surgeon: Lyman Speller, MD;  Location: [facility];  Service: Gynecology;  Laterality: Bilateral;  . Video assisted thoracoscopy (vats)/thorocotomy Right 11/04/2001  . Cesarean section  2001  . Evaluation under anesthesia with hemorrhoidectomy N/A 06/15/2014    Procedure: EXAM UNDER ANESTHESIA WITH HEMORRHOIDECTOMY;  Surgeon: Doreen Salvage, MD;  Location: [facility];  Service: General;  Laterality: N/A;  . Excision of skin tag N/A 06/15/2014    Procedure: EXCISION OF SKIN TAGS;  Surgeon: Doreen Salvage, MD;  Location: [facility];  Service: General;  Laterality: N/A;  . Dilation and curettage of uterus    . Iud removal    . High resolution anoscopy N/A 10/11/2014    Procedure: HIGH RESOLUTION ANOSCOPY WITH BIOPSY;  Surgeon: Leighton Ruff, MD;  Location: [facility];  Service: General;  Laterality: N/A;  . Co2 laser application N/A [id number]    Procedure: CO2 LASER APPLICATION;  Surgeon: Leighton Ruff, MD;  Location: [facility];  Service: General;  Laterality: N/A;  . Rectal exam under anesthesia N/A 10/11/2014    Procedure: [place];  Surgeon: Leighton Ruff, MD;  Location: [facility];  Service: General;  Laterality: N/A;        Family History  Problem Relation Age of Onset  . Breast cancer Brother 15  .  Breast cancer Maternal Aunt   . Depression Mother   . Anesthesia problems Mother     post-op N/V  . Alcohol abuse Father   . Alcohol abuse Cousin     Social History:  reports that she has been smoking Cigarettes.  She has a 30 pack-year smoking history. She has never used smokeless tobacco. She reports that she drinks alcohol. She reports that she does not use illicit drugs.   Allergies:  Allergies  Allergen Reactions  . Tamiflu Other (See Comments) and Cough    BRONCHOCONSTRICTION  . Estrogens Other (See Comments)    SYMPTOMS OF A STROKE  . Wellbutrin [Bupropion] Other  (See Comments)    CAUSED TIA WHEN TAKEN WITH ESTROGEN  . Ibuprofen Other (See Comments)    Avoids due to nosebleeds  . Morphine And Related Other (See Comments)    DOES NOT RELIEVE PAIN    Medications Prior to Admission  Medication Sig Dispense Refill  . albuterol (PROVENTIL HFA;VENTOLIN HFA) 108 (90 BASE) MCG/ACT inhaler Inhale 2 puffs into the lungs every 6 (six) hours as needed. wheezing 1 Inhaler 11  . albuterol (PROVENTIL) (5 MG/ML) 0.5% nebulizer solution Take 2.5 mg by nebulization every 6 (six) hours as needed for wheezing or shortness of breath.    . ALPRAZolam (XANAX) 1 MG tablet Take 1 mg by mouth 2 (two) times daily.    . Amantadine HCl 100 MG tablet Take 100 mg by mouth 2 (two) times daily.  0  . HYDROcodone-acetaminophen (NORCO) 10-325 MG tablet Take 1 tablet by mouth every 6 (six) hours as needed for moderate pain or severe pain.   0  . levofloxacin (LEVAQUIN) 500 MG tablet Take 500 mg by mouth daily. 10 day course starting on 05/01/2015  0  . lidocaine (XYLOCAINE) 5 % ointment Apply 1 application topically as needed. (Patient taking differently: Apply 1 application topically as needed for mild pain or moderate pain. ) 35.44 g 0  . pantoprazole (PROTONIX) 40 MG tablet Take 40 mg by mouth daily.    . polyethylene glycol (MIRALAX / GLYCOLAX) packet Take 17 g by mouth daily as needed for mild constipation.    . predniSONE (DELTASONE) 10 MG tablet Take 30 mg by mouth daily. To decrease to 20mg  daily for 3 days on 05/07/2015, etc , as directed per tapered instructions  0  . promethazine (PHENERGAN) 12.5 MG tablet Take 1 tablet by mouth every 4 (four) hours as needed for nausea or vomiting.   1       [id number] from the symptoms mentioned above,there are no other symptoms referable to all systems reviewed.  Physical Exam: Blood pressure 119/68, pulse 68, temperature 97.5 F (36.4 C), temperature source Oral, resp. rate 22, height 5\' 4"  (1.626 m), weight 111.3 kg (245 lb 6 oz),  SpO2 95 %. She is awake and alert. She appears to be in respiratory distress. I can hear her breathing across the room without a stethoscope. She is coughing during the examination. Her pupils are reactive mucous membranes are dry her neck is supple without masses. Her chest shows rhonchi bilaterally. Her heart is regular without gallop. Her abdomen is soft without masses. Bowel sounds present and active. She has trace if any edema. Central nervous system exam shows she is anxious    Recent Labs  05/06/15 1024 05/07/15 0429  WBC 15.7* 17.3*  NEUTROABS 9.8*  --   HGB 15.7* 14.1  HCT 45.5 42.2  MCV 87.5 89.4  PLT 368 314  Recent Labs  05/06/15 1024 05/07/15 0429  NA 137 136  K 3.5 4.1  CL 105 106  CO2 22 20*  GLUCOSE 100* 155*  BUN 13 8  CREATININE 0.69 0.64  CALCIUM 9.4 9.2  lablast2(ast:2,ALT:2,alkphos:2,bilitot:2,prot:2,albumin:2)@    Recent Results (from the past 240 hour(s))  MRSA PCR Screening     Status: Abnormal   Collection Time: 05/06/15 10:15 AM  Result Value Ref Range Status   MRSA by PCR POSITIVE (A) NEGATIVE Final    Comment:        The GeneXpert MRSA Assay (FDA approved for NASAL specimens only), is one component of a comprehensive MRSA colonization surveillance program. It is not intended to diagnose MRSA infection nor to guide or monitor treatment for MRSA infections. RESULT CALLED TO, READ BACK BY AND VERIFIED WITH: SPANGLER,E. AT 1828 ON 05/06/2015 BY BAUGHAM,M.      Portable Chest 1 View  05/06/2015  CLINICAL DATA:  Respiratory failure. EXAM: PORTABLE CHEST 1 VIEW COMPARISON:  01/30/2015 chest radiograph. FINDINGS: Low lung volumes. There is apparent mild enlargement of the cardiac silhouette. The mediastinal contour is otherwise within normal limits. No pneumothorax. No pleural effusion. There are diffuse hazy and linear parahilar opacities bilaterally. IMPRESSION: 1. Apparent mild enlargement of the cardiac silhouette, which could be  artifactual due to low lung volumes. 2. Diffuse hazy and linear parahilar opacities bilaterally, most suggestive of moderate pulmonary edema. Consider correlation with BNP and echocardiography to assess for congestive heart failure. Electronically Signed   By: Ilona Sorrel M.D.   On: 05/06/2015 10:41   Impression: I think she probably has pneumonia. Clinically that is the situation. She has some element of asthma/COPD. She has acute respiratory distress/respiratory failure. She has sleep apnea but is noncompliant with CPAP. Active Problems:   Respiratory failure ([place])     Plan: She will be treated with intravenous antibiotics steroids inhaled bronchodilators and BiPAP as needed.      Maybelle Depaoli L   05/07/2015, 7:57 AM

## 2015-05-07 NOTE — Progress Notes (Signed)
Patient had regular scheduled treatment this afternoon but patient was very labored and poor air movement, (she had just got up to go to the bedside commode and began having a coughing spell) gave that tx and then got an order for albuterol 10 mg CAT and placed patient on bipap due to her labored breathing (while getting CAT).  Turned CAT off and patient just not responding well, done ABG which looked good.  RN gave narcan and went for CT of the head.  Patient back in room and RN did sternal rub, I tried to get her to talk to me and she just closed her eyes back.  MD called for intubation for airway protection and other orders.

## 2015-05-07 NOTE — Progress Notes (Signed)
CRITICAL VALUE ALERT  Critical value received:  Lactic Acid 3.8  Date of notification: 05/07/15  Time of notification:  [id number]  Critical value read back:yes  Nurse who received alert: L.Schonewitz, RN  MD notified (1st page):  Dr. Luan Pulling  Time of first page:  [phone]  MD notified (2nd page):  Time of second page:  Responding MD:  Dr.Hawkins  Time MD responded:  [phone]

## 2015-05-07 NOTE — Progress Notes (Signed)
Orders given to intubate patient to protect airway by Dr. Luan Pulling, since she remains only responsive to sternal rub stimuli.

## 2015-05-07 NOTE — Progress Notes (Signed)
Went to place patient on BIPAP, patient states she does not want to go on at this time. Sitting up playing cards with husband, patient states she would like to put on herself. Told patient I would be back to give her breathing treatment and if she is not on by that time I would put her on. Patient is on 2LNC sp02 is 96%. RT will continue to monitor.

## 2015-05-07 NOTE — Progress Notes (Signed)
Upon entering the patients room, pt was receiving continuous neb treatment, Bi PAP on and is only responsive to sternal rubs. Vital signs are stable, CBG 183. Dr. Luan Pulling paged to make aware, orders given and carried out. Awaiting for results. Fiance is updated on patients change in status.

## 2015-05-07 NOTE — Progress Notes (Signed)
Subjective: She feels better. She is still short of breath. She's had bradycardia during the night. She did use BiPAP off and on yesterday. She's coughing up some sputum. She now gives a history of having had some swelling and pain in her leg.  Objective: Vital signs in last 24 hours: Temp:  [97 F (36.1 C)-98.1 F (36.7 C)] 97.5 F (36.4 C) (12/20 0400) Pulse Rate:  [53-94] 68 (12/20 0700) Resp:  [11-28] 22 (12/20 0700) BP: (88-134)/(41-109) 119/68 mmHg (12/20 0700) SpO2:  [90 %-100 %] 95 % (12/20 0750) FiO2 (%):  [30 %-50 %] 30 % (12/20 0400) Weight:  [109.9 kg (242 lb 4.6 oz)-111.3 kg (245 lb 6 oz)] 111.3 kg (245 lb 6 oz) (12/20 0500) Weight change:     Intake/Output from previous day: 12/19 0701 - 12/20 0700 In: 1896.3 [P.O.:460; I.V.:1436.3] Out: -   PHYSICAL EXAM General appearance: alert, cooperative and mild distress Resp: rhonchi bilaterally Cardio: regular rate and rhythm, S1, S2 normal, no murmur, click, rub or gallop GI: soft, non-tender; bowel sounds normal; no masses,  no organomegaly Extremities: extremities normal, atraumatic, no cyanosis or edema  Lab Results:  Results for orders placed or performed during the hospital encounter of 05/06/15 (from the past 48 hour(s))  MRSA PCR Screening     Status: Abnormal   Collection Time: 05/06/15 10:15 AM  Result Value Ref Range   MRSA by PCR POSITIVE (A) NEGATIVE    Comment:        The GeneXpert MRSA Assay (FDA approved for NASAL specimens only), is one component of a comprehensive MRSA colonization surveillance program. It is not intended to diagnose MRSA infection nor to guide or monitor treatment for MRSA infections. RESULT CALLED TO, READ BACK BY AND VERIFIED WITH: SPANGLER,E. AT [place] ON 05/06/2015 BY BAUGHAM,M.   Comprehensive metabolic panel     Status: Abnormal   Collection Time: 05/06/15 10:24 AM  Result Value Ref Range   Sodium 137 135 - 145 mmol/L   Potassium 3.5 3.5 - 5.1 mmol/L   Chloride 105  101 - 111 mmol/L   CO2 22 22 - 32 mmol/L   Glucose, Bld 100 (H) 65 - 99 mg/dL   BUN 13 6 - 20 mg/dL   Creatinine, Ser 0.69 0.44 - 1.00 mg/dL   Calcium 9.4 8.9 - 10.3 mg/dL   Total Protein 8.1 6.5 - 8.1 g/dL   Albumin 4.3 3.5 - 5.0 g/dL   AST 18 15 - 41 U/L   ALT 14 14 - 54 U/L   Alkaline Phosphatase 68 38 - 126 U/L   Total Bilirubin 0.6 0.3 - 1.2 mg/dL   GFR calc non Af Amer >60 >60 mL/min   GFR calc Af Amer >60 >60 mL/min    Comment: (NOTE) The eGFR has been calculated using the CKD EPI equation. This calculation has not been validated in all clinical situations. eGFR's persistently <60 mL/min signify possible Chronic Kidney Disease.    Anion gap 10 5 - 15  CBC WITH DIFFERENTIAL     Status: Abnormal   Collection Time: 05/06/15 10:24 AM  Result Value Ref Range   WBC 15.7 (H) 4.0 - 10.5 K/uL   RBC 5.20 (H) 3.87 - 5.11 MIL/uL   Hemoglobin 15.7 (H) 12.0 - 15.0 g/dL   HCT 45.5 36.0 - 46.0 %   MCV 87.5 78.0 - 100.0 fL   MCH 30.2 26.0 - 34.0 pg   MCHC 34.5 30.0 - 36.0 g/dL   RDW 13.2 11.5 -  15.5 %   Platelets 368 150 - 400 K/uL   Neutrophils Relative % 62 %   Neutro Abs 9.8 (H) 1.7 - 7.7 K/uL   Lymphocytes Relative 30 %   Lymphs Abs 4.7 (H) 0.7 - 4.0 K/uL   Monocytes Relative 7 %   Monocytes Absolute 1.1 (H) 0.1 - 1.0 K/uL   Eosinophils Relative 1 %   Eosinophils Absolute 0.1 0.0 - 0.7 K/uL   Basophils Relative 0 %   Basophils Absolute 0.0 0.0 - 0.1 K/uL  Brain natriuretic peptide     Status: None   Collection Time: 05/06/15 10:24 AM  Result Value Ref Range   B Natriuretic Peptide 22.0 0.0 - 100.0 pg/mL  Blood gas, arterial     Status: Abnormal   Collection Time: 05/06/15 10:25 AM  Result Value Ref Range   O2 Content 2.0 L/min   Delivery systems NASAL CANNULA    pH, Arterial 7.543 (H) 7.350 - 7.450   pCO2 arterial 23.6 (L) 35.0 - 45.0 mmHg   pO2, Arterial 101 (H) 80.0 - 100.0 mmHg   Bicarbonate 24.3 (H) 20.0 - 24.0 mEq/L   Acid-base deficit 2.0 0.0 - 2.0 mmol/L   O2  Saturation 98.1 %   Patient temperature 37.0    Collection site LEFT RADIAL    Drawn by [id number]    Sample type ARTERIAL DRAW    Allens test (pass/fail) PASS PASS  Basic metabolic panel     Status: Abnormal   Collection Time: 05/07/15  4:29 AM  Result Value Ref Range   Sodium 136 135 - 145 mmol/L   Potassium 4.1 3.5 - 5.1 mmol/L   Chloride 106 101 - 111 mmol/L   CO2 20 (L) 22 - 32 mmol/L   Glucose, Bld 155 (H) 65 - 99 mg/dL   BUN 8 6 - 20 mg/dL   Creatinine, Ser 0.64 0.44 - 1.00 mg/dL   Calcium 9.2 8.9 - 10.3 mg/dL   GFR calc non Af Amer >60 >60 mL/min   GFR calc Af Amer >60 >60 mL/min    Comment: (NOTE) The eGFR has been calculated using the CKD EPI equation. This calculation has not been validated in all clinical situations. eGFR's persistently <60 mL/min signify possible Chronic Kidney Disease.    Anion gap 10 5 - 15  CBC     Status: Abnormal   Collection Time: 05/07/15  4:29 AM  Result Value Ref Range   WBC 17.3 (H) 4.0 - 10.5 K/uL   RBC 4.72 3.87 - 5.11 MIL/uL   Hemoglobin 14.1 12.0 - 15.0 g/dL   HCT 42.2 36.0 - 46.0 %   MCV 89.4 78.0 - 100.0 fL   MCH 29.9 26.0 - 34.0 pg   MCHC 33.4 30.0 - 36.0 g/dL   RDW 13.4 11.5 - 15.5 %   Platelets 314 150 - 400 K/uL    ABGS  Recent Labs  05/06/15 1025  PHART 7.543*  PO2ART 101*  HCO3 24.3*   CULTURES Recent Results (from the past 240 hour(s))  MRSA PCR Screening     Status: Abnormal   Collection Time: 05/06/15 10:15 AM  Result Value Ref Range Status   MRSA by PCR POSITIVE (A) NEGATIVE Final    Comment:        The GeneXpert MRSA Assay (FDA approved for NASAL specimens only), is one component of a comprehensive MRSA colonization surveillance program. It is not intended to diagnose MRSA infection nor to guide or monitor treatment for MRSA infections. RESULT  CALLED TO, READ BACK BY AND VERIFIED WITH: SPANGLER,E. AT 8768 ON 05/06/2015 BY BAUGHAM,M.    Studies/Results: Portable Chest 1 View  05/06/2015   CLINICAL DATA:  Respiratory failure. EXAM: PORTABLE CHEST 1 VIEW COMPARISON:  01/30/2015 chest radiograph. FINDINGS: Low lung volumes. There is apparent mild enlargement of the cardiac silhouette. The mediastinal contour is otherwise within normal limits. No pneumothorax. No pleural effusion. There are diffuse hazy and linear parahilar opacities bilaterally. IMPRESSION: 1. Apparent mild enlargement of the cardiac silhouette, which could be artifactual due to low lung volumes. 2. Diffuse hazy and linear parahilar opacities bilaterally, most suggestive of moderate pulmonary edema. Consider correlation with BNP and echocardiography to assess for congestive heart failure. Electronically Signed   By: Ilona Sorrel M.D.   On: 05/06/2015 10:41    Medications:  Prior to Admission:  Prescriptions prior to admission  Medication Sig Dispense Refill Last Dose  . albuterol (PROVENTIL HFA;VENTOLIN HFA) 108 (90 BASE) MCG/ACT inhaler Inhale 2 puffs into the lungs every 6 (six) hours as needed. wheezing 1 Inhaler 11 05/06/2015 at Unknown time  . albuterol (PROVENTIL) (5 MG/ML) 0.5% nebulizer solution Take 2.5 mg by nebulization every 6 (six) hours as needed for wheezing or shortness of breath.   unknown  . ALPRAZolam (XANAX) 1 MG tablet Take 1 mg by mouth 2 (two) times daily.   05/05/2015 at Unknown time  . Amantadine HCl 100 MG tablet Take 100 mg by mouth 2 (two) times daily.  0 05/05/2015 at Unknown time  . HYDROcodone-acetaminophen (NORCO) 10-325 MG tablet Take 1 tablet by mouth every 6 (six) hours as needed for moderate pain or severe pain.   0 05/06/2015 at 130a  . levofloxacin (LEVAQUIN) 500 MG tablet Take 500 mg by mouth daily. 10 day course starting on 05/01/2015  0 05/05/2015 at Unknown time  . lidocaine (XYLOCAINE) 5 % ointment Apply 1 application topically as needed. (Patient taking differently: Apply 1 application topically as needed for mild pain or moderate pain. ) 35.44 g 0 Past Week at Unknown time  .  pantoprazole (PROTONIX) 40 MG tablet Take 40 mg by mouth daily.   05/05/2015 at 2200  . polyethylene glycol (MIRALAX / GLYCOLAX) packet Take 17 g by mouth daily as needed for mild constipation.   unknown  . predniSONE (DELTASONE) 10 MG tablet Take 30 mg by mouth daily. To decrease to 61m daily for 3 days on 05/07/2015, etc , as directed per tapered instructions  0 05/06/2015 at Unknown time  . promethazine (PHENERGAN) 12.5 MG tablet Take 1 tablet by mouth every 4 (four) hours as needed for nausea or vomiting.   1 05/05/2015 at Unknown time   Scheduled: . ALPRAZolam  1 mg Oral BID  . azithromycin  500 mg Intravenous Q24H  . cefTRIAXone (ROCEPHIN)  IV  1 g Intravenous Q24H  . Chlorhexidine Gluconate Cloth  6 each Topical Q0600  . enoxaparin (LOVENOX) injection  40 mg Subcutaneous Q24H  . guaiFENesin  600 mg Oral BID  . ipratropium-albuterol  3 mL Nebulization Q6H  . levothyroxine  50 mcg Oral QAC breakfast  . methylPREDNISolone (SOLU-MEDROL) injection  80 mg Intravenous Q6H  . mupirocin ointment  1 application Nasal BID   Continuous: . sodium chloride 75 mL/hr at 05/07/15 0600   PTLX:[id number]**OR** acetaminophen, albuterol, alum & mag hydroxide-simeth, HYDROcodone-acetaminophen, ondansetron **OR** ondansetron (ZOFRAN) IV, polyethylene glycol  Assesment: She was admitted with what I think is community-acquired pneumonia although her chest x-ray shows diffuse infiltrates. I think  this is probably scarring from her episode of acute respiratory failure and acute lung injury requiring ventilator support. She did require BiPAP through the night. She says she is noncompliant with C Pap at home but she will attempt to use that. She's had bradycardia on the monitor and says she's had that in the past and it does increase her anxiety. Considering the fact that she has had swelling of her leg I think we need to do CT angiogram to make sure that were not missing a pulmonary embolus although I think  that's fairly unlikely. Active Problems:   CAP (community acquired pneumonia)   Asthma with acute exacerbation   Respiratory failure ([facility])   OSA (obstructive sleep apnea)   Bradycardia    Plan: She gives a history of hypothyroidism but is not on any replacement so I'm going to check a TSH. That could certainly be a problem with her bradycardia. She will have echocardiogram. She will have CT angiogram. Cardiology consultation has been requested to continue with IV antibiotics and steroids BiPAP as needed.    LOS: 1 day   Aolanis Crispen L 05/07/2015, 8:08 AM

## 2015-05-07 NOTE — Consult Note (Signed)
Consulting cardiologist: Dr. Satira Sark  Reason for consultation: Bradycardia  Clinical Summary Denise Ray is a 44 y.o.female currently admitted to the hospital by Dr. Luan Pulling with COPD exacerbation and respiratory failure. She is currently being managed with antibiotics, nebulizers, and steroids with suspected acute community-acquired pneumonia. We are consulted due to bradycardia noted intermittently in the early morning hours. She has a history of sleep apnea and is noncompliant with CPAP at home, has used BiPAP during this hospital stay so far.  I reviewed her telemetry. Heart rate did get down into the low 50s transiently, but there were no significant arrhythmias or pauses.   Tracing from 06/19/2014 showed sinus rhythm with borderline low voltage and normal intervals.  She does not have history of sudden syncope. Medications reviewed and at this point she is not on any heart rate lowering medications.  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   Allergies  Allergen Reactions  . Tamiflu Other (See Comments) and Cough    BRONCHOCONSTRICTION  . Estrogens Other (See Comments)    SYMPTOMS OF A STROKE  . Wellbutrin [Bupropion] Other (See Comments)    CAUSED TIA WHEN TAKEN WITH ESTROGEN  . Ibuprofen Other (See Comments)    Avoids due to nosebleeds  . Morphine And Related Other (See Comments)    DOES NOT RELIEVE PAIN    Medications Scheduled Medications: . ALPRAZolam  1 mg Oral BID  . azithromycin  500 mg Intravenous Q24H  . cefTRIAXone (ROCEPHIN)  IV  1 g Intravenous Q24H  . Chlorhexidine Gluconate Cloth  6 each Topical Q0600  . enoxaparin (LOVENOX) injection  40 mg Subcutaneous Q24H  . guaiFENesin  600 mg Oral BID  . ipratropium-albuterol  3 mL Nebulization Q6H  . levothyroxine  50 mcg Oral QAC breakfast  . methylPREDNISolone (SOLU-MEDROL) injection  80 mg Intravenous Q6H  . mupirocin ointment  1 application Nasal BID    Infusions: . sodium  chloride 75 mL/hr at 05/07/15 0600    PRN Medications: acetaminophen **OR** acetaminophen, albuterol, alum & mag hydroxide-simeth, HYDROcodone-acetaminophen, ondansetron **OR** ondansetron (ZOFRAN) IV, polyethylene glycol   Past Medical History  Diagnosis Date  . GERD (gastroesophageal reflux disease)   . Migraines   . Arthritis     Right hip  . History of TIA (transient ischemic attack)     Caused by medication reaction  . Asthma   . Anxiety   . Hemorrhoids 05/2014  . Chronic pain   . Hypothyroidism        . OSA (obstructive sleep apnea)   . Borderline diabetes mellitus   . History of pneumonia 6/15    Required ventilatory support  . Mass of lung     Chest wall mass    Past Surgical History  Procedure Laterality Date  . Conization of cervix  2000  . Hysteroscopy w/d&c  09/12/2007  &  2005  . Chest wall/lung mass resection  7/03  . Robotic assisted total hysterectomy N/A 10/11/2012    Procedure: ROBOTIC ASSISTED TOTAL HYSTERECTOMY;  Surgeon: Lyman Speller, MD;  Location: [facility];  Service: Gynecology;  Laterality: N/A;  . Salpingoophorectomy Bilateral 10/11/2012    Procedure: SALPINGO OOPHORECTOMY;  Surgeon: Lyman Speller, MD;  Location: [facility];  Service: Gynecology;  Laterality: Bilateral;  . Video assisted thoracoscopy (vats)/thorocotomy Right 11/04/2001  . Cesarean section  2001  . Evaluation under anesthesia with hemorrhoidectomy N/A 06/15/2014    Procedure: EXAM UNDER ANESTHESIA WITH HEMORRHOIDECTOMY;  Surgeon:  Doreen Salvage, MD;  Location: [facility];  Service: General;  Laterality: N/A;  . Excision of skin tag N/A 06/15/2014    Procedure: EXCISION OF SKIN TAGS;  Surgeon: Doreen Salvage, MD;  Location: [facility];  Service: General;  Laterality: N/A;  . Dilation and curettage of uterus    . Iud removal    . High resolution anoscopy N/A 10/11/2014    Procedure: HIGH RESOLUTION ANOSCOPY WITH BIOPSY;  Surgeon: Leighton Ruff, MD;  Location:  [facility];  Service: General;  Laterality: N/A;  . Co2 laser application N/A [id number]    Procedure: CO2 LASER APPLICATION;  Surgeon: Leighton Ruff, MD;  Location: [facility];  Service: General;  Laterality: N/A;  . Rectal exam under anesthesia N/A 10/11/2014    Procedure: [place];  Surgeon: Leighton Ruff, MD;  Location: [facility];  Service: General;  Laterality: N/A;    Family History  Problem Relation Age of Onset  . Breast cancer Brother 15  . Breast cancer Maternal Aunt   . Depression Mother   . Anesthesia problems Mother     post-op N/V  . Alcohol abuse Father   . Alcohol abuse Cousin     Social History Denise Ray reports that she has been smoking Cigarettes.  She has a 30 pack-year smoking history. She has never used smokeless tobacco. Denise Ray reports that she drinks alcohol.  Review of Systems Complete review of systems negative except as otherwise outlined in the clinical summary and also the following. Recent dyspnea and cough, chest congestion.  Physical Examination Blood pressure 103/69, pulse 74, temperature 97.2 F (36.2 C), temperature source Oral, resp. rate 18, height 5\' 4"  (1.626 m), weight 245 lb 6 oz (111.3 kg), SpO2 94 %.  Intake/Output Summary (Last 24 hours) at 05/07/15 0946 Last data filed at 05/07/15 0819  Gross per 24 hour  Intake 2136.25 ml  Output      0 ml  Net 2136.25 ml   Telemetry: Sinus rhythm and transient sinus bradycardia. Lead motion artifact also noted (no VT).  Gen: Obese woman in no distress. HEENT: Conjunctiva and lids normal, oropharynx clear. Neck: Supple, no elevated JVP or carotid bruits, no thyromegaly. Lungs: Diminished breath sounds, prolonged expiratory phase, nonlabored breathing at rest. Cardiac: Regular rate and rhythm, no S3 or significant systolic murmur, no pericardial rub. Abdomen: Soft, nontender, bowel sounds present. Extremities: No pitting edema,  distal pulses 2+. Skin: Warm and dry. Musculoskeletal: No kyphosis. Neuropsychiatric: Alert and oriented x3, affect grossly appropriate.  Lab Results  Basic Metabolic Panel:  Recent Labs Lab 05/06/15 1024 05/07/15 0429  NA 137 136  K 3.5 4.1  CL 105 106  CO2 22 20*  GLUCOSE 100* 155*  BUN 13 8  CREATININE 0.69 0.64  CALCIUM 9.4 9.2    Liver Function Tests:  Recent Labs Lab 05/06/15 1024  AST 18  ALT 14  ALKPHOS 68  BILITOT 0.6  PROT 8.1  ALBUMIN 4.3    CBC:  Recent Labs Lab 05/06/15 1024 05/07/15 0429  WBC 15.7* 17.3*  NEUTROABS 9.8*  --   HGB 15.7* 14.1  HCT 45.5 42.2  MCV 87.5 89.4  PLT 368 314    Imaging  Chest CT 05/07/2015: FINDINGS: The study is somewhat degraded by respiratory motion and bolus timing. No pulmonary embolus is identified. There is mild cardiomegaly. No pleural or pericardial effusion. Surgical clips along the anterior margin of the distal esophagus are  noted. There is no axillary, hilar or mediastinal lymphadenopathy. The lungs demonstrate dependent atelectasis. Scattered ground-glass attenuation is also identified.  Visualized upper abdomen shows no focal abnormality. No focal bony abnormality is identified.  Review of the MIP images confirms the above findings.  IMPRESSION: Negative for pulmonary embolus.  Cardiomegaly.  Scattered ground-glass attenuation in the lungs could be due to atelectasis or possibly mild interstitial edema.   Impression  1. Nocturnal sinus bradycardia, transient. No pauses or heart block. Possibly associated with known diagnosis of OSA.  2. COPD exacerbation with possible pneumonia. Being managed by Dr. Luan Pulling.  3. History of OSA, noncompliant with CPAP at home.   Recommendations  Discussed with patient. Would observe heart rate and rhythm for now. Will followup on echocardigram that has been ordered to assess cardiac structure and function. Check ECG.  Satira Sark,  M.D., F.A.C.C.

## 2015-05-08 ENCOUNTER — Inpatient Hospital Stay (HOSPITAL_COMMUNITY): Payer: 59

## 2015-05-08 LAB — BLOOD GAS, ARTERIAL
ACID-BASE DEFICIT: 1.2 mmol/L (ref 0.0–2.0)
Bicarbonate: 23.1 mEq/L (ref 20.0–24.0)
Drawn by: 22223
FIO2: 50
LHR: 14 {breaths}/min
MECHVT: 540 mL
O2 Saturation: 98.6 %
PATIENT TEMPERATURE: 37
PCO2 ART: 44.5 mmHg (ref 35.0–45.0)
PEEP/CPAP: 6 cmH2O
PO2 ART: 143 mmHg — AB (ref 80.0–100.0)
TCO2: 18.9 mmol/L (ref 0–100)
pH, Arterial: 7.345 — ABNORMAL LOW (ref 7.350–7.450)

## 2015-05-08 LAB — GLUCOSE, CAPILLARY
GLUCOSE-CAPILLARY: 139 mg/dL — AB (ref 65–99)
GLUCOSE-CAPILLARY: 127 mg/dL — AB (ref 65–99)
GLUCOSE-CAPILLARY: 154 mg/dL — AB (ref 65–99)
Glucose-Capillary: 128 mg/dL — ABNORMAL HIGH (ref 65–99)

## 2015-05-08 LAB — LACTIC ACID, PLASMA: Lactic Acid, Venous: 2.7 mmol/L (ref 0.5–2.0)

## 2015-05-08 IMAGING — CR DG CHEST 1V PORT
1 series · 1 of 1 positions shown · non-contrast
Comparison: Two days ago

CLINICAL DATA: ET tube placement

EXAM:
PORTABLE CHEST 1 VIEW

[ap]
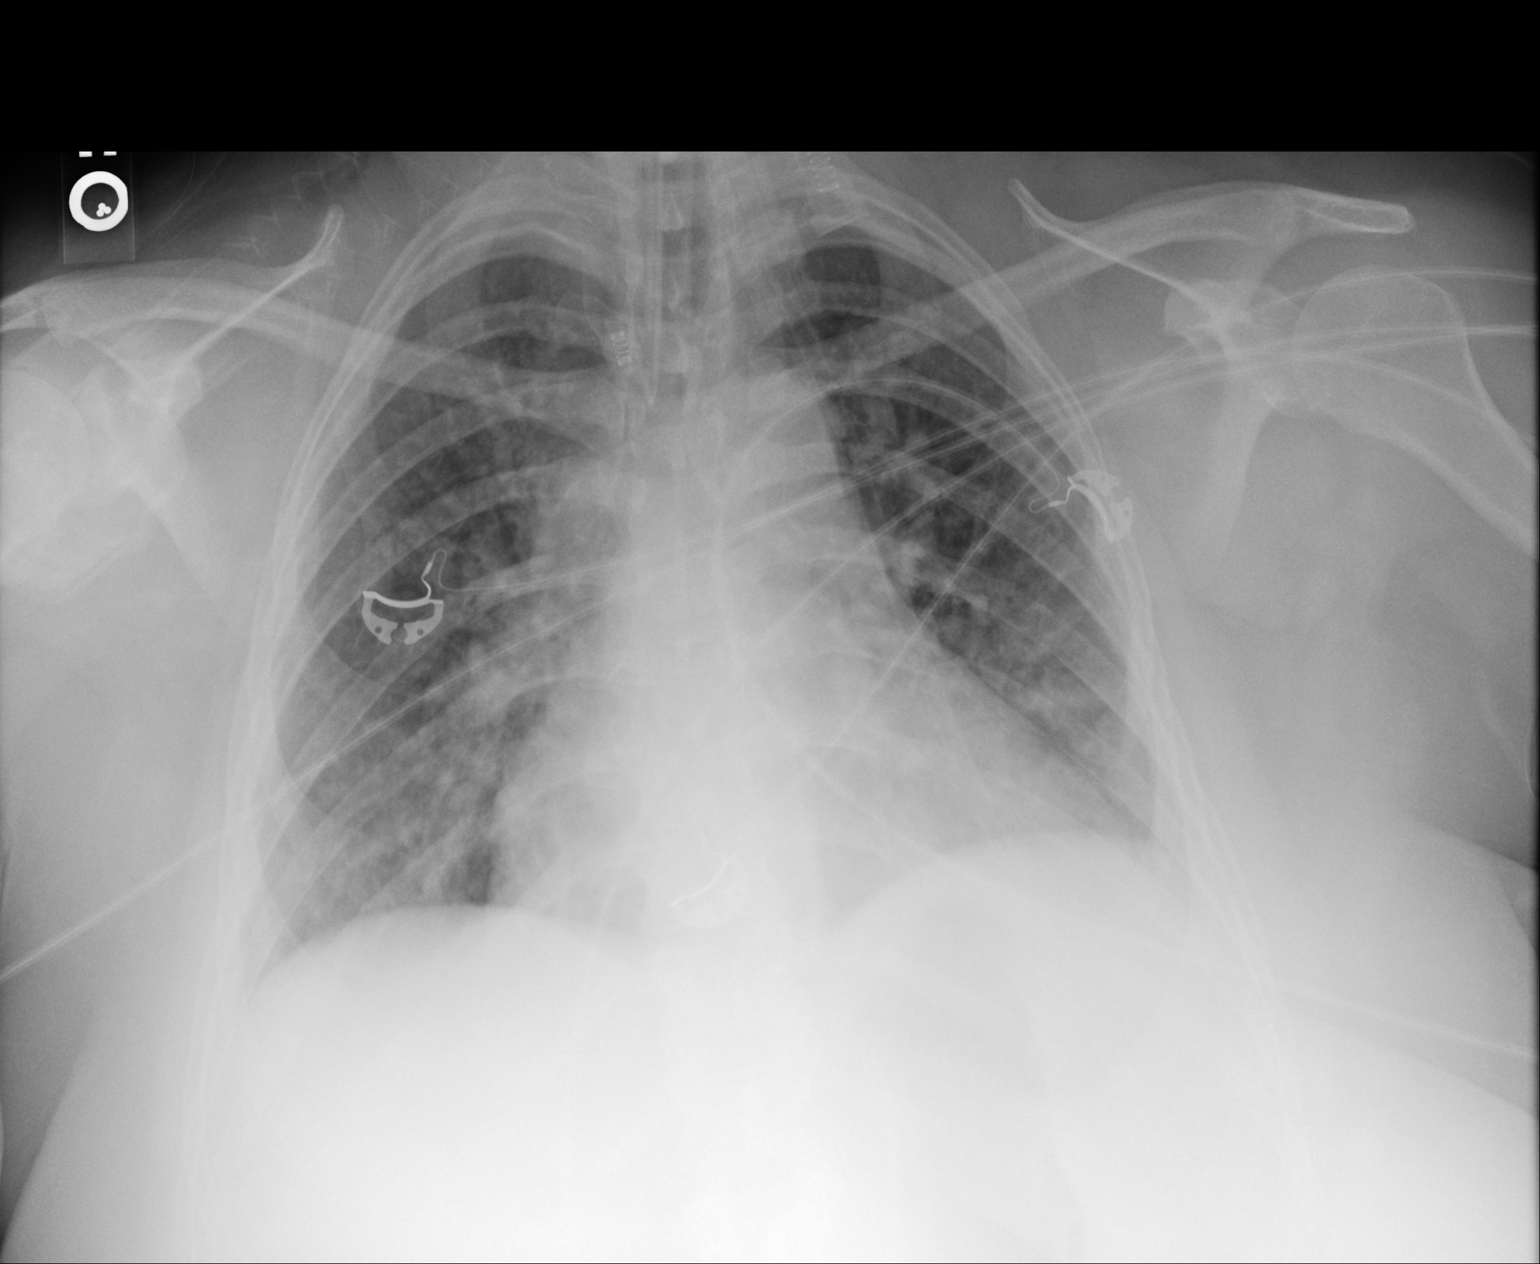

[1 of 1 positions shown; findings below may reference images not displayed]

FINDINGS: New endotracheal tube with tip at the clavicular heads.

Cardiomegaly with congested appearance of pulmonary vessels . No
consolidation, edema, effusion, or pneumothorax.
IMPRESSION: 1. New endotracheal tube is in good position.
2. Congested appearance of the pulmonary vessels, accentuated by low
volumes.

## 2015-05-08 MED ORDER — PROPOFOL 1000 MG/100ML IV EMUL
INTRAVENOUS | Status: AC
  Filled 2015-05-08: qty 100

## 2015-05-08 MED ORDER — ANTISEPTIC ORAL RINSE SOLUTION (CORINZ)
7.0000 mL | Freq: Four times a day (QID) | OROMUCOSAL | Status: DC
  Administered 2015-05-08 – 2015-05-11 (×12): 7 mL via OROMUCOSAL

## 2015-05-08 MED ORDER — PANTOPRAZOLE SODIUM 40 MG IV SOLR
40.0000 mg | INTRAVENOUS | Status: DC
  Administered 2015-05-08 – 2015-05-13 (×6): 40 mg via INTRAVENOUS
  Filled 2015-05-08 (×6): qty 40

## 2015-05-08 MED ORDER — MIDAZOLAM HCL 2 MG/2ML IJ SOLN
2.0000 mg | INTRAMUSCULAR | Status: DC | PRN
  Administered 2015-05-08 (×2): 2 mg via INTRAVENOUS
  Filled 2015-05-08 (×4): qty 2

## 2015-05-08 MED ORDER — NALOXONE HCL 0.4 MG/ML IJ SOLN
0.4000 mg | INTRAMUSCULAR | Status: DC | PRN
  Filled 2015-05-08: qty 1

## 2015-05-08 MED ORDER — NALOXONE HCL 0.4 MG/ML IJ SOLN
INTRAMUSCULAR | Status: AC
  Administered 2015-05-08: 0.4 mg
  Filled 2015-05-08: qty 1

## 2015-05-08 MED ORDER — PROPOFOL 1000 MG/100ML IV EMUL
5.0000 ug/kg/min | INTRAVENOUS | Status: DC
  Administered 2015-05-08: 50 ug/kg/min via INTRAVENOUS
  Administered 2015-05-08 – 2015-05-10 (×19): 70 ug/kg/min via INTRAVENOUS
  Filled 2015-05-08 (×2): qty 100
  Filled 2015-05-08: qty 200
  Filled 2015-05-08 (×5): qty 100
  Filled 2015-05-08 (×2): qty 200
  Filled 2015-05-08: qty 100
  Filled 2015-05-08: qty 200
  Filled 2015-05-08 (×3): qty 100
  Filled 2015-05-08: qty 200
  Filled 2015-05-08: qty 100
  Filled 2015-05-08: qty 200
  Filled 2015-05-08: qty 100

## 2015-05-08 MED ORDER — CHLORHEXIDINE GLUCONATE 0.12% ORAL RINSE (MEDLINE KIT)
15.0000 mL | Freq: Two times a day (BID) | OROMUCOSAL | Status: DC
  Administered 2015-05-08 – 2015-05-10 (×6): 15 mL via OROMUCOSAL

## 2015-05-08 NOTE — Progress Notes (Signed)
eLink Physician-Brief Progress Note Patient Name: Denise Ray DOB: Apr 11, 1971 MRN: [id number]   Date of Service  05/08/2015  HPI/Events of Note  Given Narcan 0.4 mg IV X 2. Remains somewhat lethargic. Remains with labored respirations >> intubate and ventilate.  eICU Interventions  Will order: 1. Ventilator orders: 100%/PRVC 14/TV 8 mL/kg/P 5. 2. Portable CXR post intubation. 3. Propofol IV infusion. Titrate to RASS -2 to -3. 4. ABG at 4:45 AM. 5. Protonix IV Q day.      Intervention Category Major Interventions: Respiratory failure - evaluation and management  Muhsin Doris Eugene 05/08/2015, 3:38 AM

## 2015-05-08 NOTE — Progress Notes (Signed)
RTT was called to patient after she complained of SOB. Patient had taken BIPAP off earlier due to SOB and told nurse she needed and HHN treatment. Patient received and HHN inline with her BIPAP;all vitals within normal range. Patient  was resting quietly. RTT hade moved on to another patient when nurse called and said patient boyfriend said she was having trouble breathing. Upon entering the room notice patient sitting up high in the bed and take large volumes of air while BIPAP was on her face (12-998+ liters, RR 30s, HR within normal range) Patient appeared to be leghtic and wasn't answering questions. Boyfriend was at bedside. He mentioned patient has asthma and panic attacks. E-link physician was called and ordered Narcan;patient had periods of apnea and was pulling at her clothing. Patient continued to have trouble breathing and went apneic once again, E-link physician requested patient to be intubated. Patient was oxygenated with Ambu bag until ED physician arrived. Ed physician used a glide scope;intubated patient with a 7.5 ETT 23 at the lip. Patient was placed on settings and ABG was drawn. Patient was very combative after being intubated.

## 2015-05-08 NOTE — Progress Notes (Signed)
Lactic acid 2.7 value improved  RT and RN into room with patient who is sitting straight up in bed gasping tachypnic and taking very deep breaths with change in LOC. Elink button pressed and MD is consulting into room. Narcan given to reverse po ultram dose.

## 2015-05-08 NOTE — Progress Notes (Signed)
Called to respond to CODE BLUE. CODE BLUE was apparently a respiratory arrest. Patient had been given some Narcan and was breathing when I arrived however with periods of apnea and clearly using accessory muscles of respiration. She had not tolerated BiPAP earlier in the evening. Decision was made to intubate the patient. Earlier, she had refused intubation. She was awake and alert enough to be asked if she would agree to intubation and she stated that she would.  INTUBATION Performed by: [id number]  Required items: required blood products, implants, devices, and special equipment available Patient identity confirmed: provided demographic data and hospital-assigned identification number Time out: Immediately prior to procedure a "time out" was called to verify the correct patient, procedure, equipment, support staff and site/side marked as required.  Indications: Respiratory failure   Intubation method: Glidescope Laryngoscopy   Preoxygenation: BVM  Sedatives: Etomidate Paralytic: Succinylcholine  Tube Size: 7.5 cuffed  Post-procedure assessment: chest rise and ETCO2 monitor Breath sounds: equal and absent over the epigastrium Tube secured with: ETT holder Chest x-ray interpreted by radiologist and me.  Chest x-ray findings: endotracheal tube in appropriate position  Patient tolerated the procedure well with no immediate complications.  ]

## 2015-05-08 NOTE — Progress Notes (Signed)
Subjective: Events noted. She is now intubated and on a ventilator. She had an episode similar to that yesterday afternoon and had what appeared to be a delayed reaction to Narcan. Conversation was overheard between the patient and her mother that might indicate that she had taken some of her home medication prior to the first episode. It is not totally clear if that happened. She has been on narcotics at home because of a rectal cancer.  Objective: Vital signs in last 24 hours: Temp:  [97.2 F (36.2 C)-98.9 F (37.2 C)] 98.6 F (37 C) (12/21 0400) Pulse Rate:  [62-113] 93 (12/21 0500) Resp:  [10-26] 14 (12/21 0500) BP: (83-162)/(55-114) 162/114 mmHg (12/21 0400) SpO2:  [84 %-97 %] 94 % (12/21 0500) FiO2 (%):  [30 %-50 %] 50 % (12/21 0350) Weight:  [111.5 kg (245 lb 13 oz)] 111.5 kg (245 lb 13 oz) (12/21 0500) Weight change: 1.6 kg (3 lb 8.4 oz) Last BM Date: 05/07/15  Intake/Output from previous day: 12/20 0701 - 12/21 0700 In: 2580 [P.O.:480; I.V.:1500; IV Piggyback:600] Out: -   PHYSICAL EXAM General appearance: Intubated and sedated Resp: rhonchi bilaterally Cardio: regular rate and rhythm, S1, S2 normal, no murmur, click, rub or gallop GI: soft, non-tender; bowel sounds normal; no masses,  no organomegaly Extremities: extremities normal, atraumatic, no cyanosis or edema  Lab Results:  Results for orders placed or performed during the hospital encounter of 05/06/15 (from the past 48 hour(s))  MRSA PCR Screening     Status: Abnormal   Collection Time: 05/06/15 10:15 AM  Result Value Ref Range   MRSA by PCR POSITIVE (A) NEGATIVE    Comment:        The GeneXpert MRSA Assay (FDA approved for NASAL specimens only), is one component of a comprehensive MRSA colonization surveillance program. It is not intended to diagnose MRSA infection nor to guide or monitor treatment for MRSA infections. RESULT CALLED TO, READ BACK BY AND VERIFIED WITH: SPANGLER,E. AT [place] ON  05/06/2015 BY BAUGHAM,M.   Comprehensive metabolic panel     Status: Abnormal   Collection Time: 05/06/15 10:24 AM  Result Value Ref Range   Sodium 137 135 - 145 mmol/L   Potassium 3.5 3.5 - 5.1 mmol/L   Chloride 105 101 - 111 mmol/L   CO2 22 22 - 32 mmol/L   Glucose, Bld 100 (H) 65 - 99 mg/dL   BUN 13 6 - 20 mg/dL   Creatinine, Ser 0.69 0.44 - 1.00 mg/dL   Calcium 9.4 8.9 - 10.3 mg/dL   Total Protein 8.1 6.5 - 8.1 g/dL   Albumin 4.3 3.5 - 5.0 g/dL   AST 18 15 - 41 U/L   ALT 14 14 - 54 U/L   Alkaline Phosphatase 68 38 - 126 U/L   Total Bilirubin 0.6 0.3 - 1.2 mg/dL   GFR calc non Af Amer >60 >60 mL/min   GFR calc Af Amer >60 >60 mL/min    Comment: (NOTE) The eGFR has been calculated using the CKD EPI equation. This calculation has not been validated in all clinical situations. eGFR's persistently <60 mL/min signify possible Chronic Kidney Disease.    Anion gap 10 5 - 15  CBC WITH DIFFERENTIAL     Status: Abnormal   Collection Time: 05/06/15 10:24 AM  Result Value Ref Range   WBC 15.7 (H) 4.0 - 10.5 K/uL   RBC 5.20 (H) 3.87 - 5.11 MIL/uL   Hemoglobin 15.7 (H) 12.0 - 15.0 g/dL   HCT  45.5 36.0 - 46.0 %   MCV 87.5 78.0 - 100.0 fL   MCH 30.2 26.0 - 34.0 pg   MCHC 34.5 30.0 - 36.0 g/dL   RDW 13.2 11.5 - 15.5 %   Platelets 368 150 - 400 K/uL   Neutrophils Relative % 62 %   Neutro Abs 9.8 (H) 1.7 - 7.7 K/uL   Lymphocytes Relative 30 %   Lymphs Abs 4.7 (H) 0.7 - 4.0 K/uL   Monocytes Relative 7 %   Monocytes Absolute 1.1 (H) 0.1 - 1.0 K/uL   Eosinophils Relative 1 %   Eosinophils Absolute 0.1 0.0 - 0.7 K/uL   Basophils Relative 0 %   Basophils Absolute 0.0 0.0 - 0.1 K/uL  Brain natriuretic peptide     Status: None   Collection Time: 05/06/15 10:24 AM  Result Value Ref Range   B Natriuretic Peptide 22.0 0.0 - 100.0 pg/mL  TSH     Status: None   Collection Time: 05/06/15 10:24 AM  Result Value Ref Range   TSH 2.536 0.350 - 4.500 uIU/mL  Blood gas, arterial     Status:  Abnormal   Collection Time: 05/06/15 10:25 AM  Result Value Ref Range   O2 Content 2.0 L/min   Delivery systems NASAL CANNULA    pH, Arterial 7.543 (H) 7.350 - 7.450   pCO2 arterial 23.6 (L) 35.0 - 45.0 mmHg   pO2, Arterial 101 (H) 80.0 - 100.0 mmHg   Bicarbonate 24.3 (H) 20.0 - 24.0 mEq/L   Acid-base deficit 2.0 0.0 - 2.0 mmol/L   O2 Saturation 98.1 %   Patient temperature 37.0    Collection site LEFT RADIAL    Drawn by [id number]    Sample type ARTERIAL DRAW    Allens test (pass/fail) PASS PASS  Basic metabolic panel     Status: Abnormal   Collection Time: 05/07/15  4:29 AM  Result Value Ref Range   Sodium 136 135 - 145 mmol/L   Potassium 4.1 3.5 - 5.1 mmol/L   Chloride 106 101 - 111 mmol/L   CO2 20 (L) 22 - 32 mmol/L   Glucose, Bld 155 (H) 65 - 99 mg/dL   BUN 8 6 - 20 mg/dL   Creatinine, Ser 0.64 0.44 - 1.00 mg/dL   Calcium 9.2 8.9 - 10.3 mg/dL   GFR calc non Af Amer >60 >60 mL/min   GFR calc Af Amer >60 >60 mL/min    Comment: (NOTE) The eGFR has been calculated using the CKD EPI equation. This calculation has not been validated in all clinical situations. eGFR's persistently <60 mL/min signify possible Chronic Kidney Disease.    Anion gap 10 5 - 15  CBC     Status: Abnormal   Collection Time: 05/07/15  4:29 AM  Result Value Ref Range   WBC 17.3 (H) 4.0 - 10.5 K/uL   RBC 4.72 3.87 - 5.11 MIL/uL   Hemoglobin 14.1 12.0 - 15.0 g/dL   HCT 42.2 36.0 - 46.0 %   MCV 89.4 78.0 - 100.0 fL   MCH 29.9 26.0 - 34.0 pg   MCHC 33.4 30.0 - 36.0 g/dL   RDW 13.4 11.5 - 15.5 %   Platelets 314 150 - 400 K/uL  Glucose, capillary     Status: Abnormal   Collection Time: 05/07/15  3:33 PM  Result Value Ref Range   Glucose-Capillary 183 (H) 65 - 99 mg/dL   Comment 1 Notify RN    Comment 2 Document in Chart  Blood gas, arterial     Status: Abnormal   Collection Time: 05/07/15  3:49 PM  Result Value Ref Range   FIO2 30.00    Delivery systems BILEVEL POSITIVE AIRWAY PRESSURE     Inspiratory PAP 10.00    Expiratory PAP 5.00    pH, Arterial 7.379 7.350 - 7.450   pCO2 arterial 37.4 35.0 - 45.0 mmHg   pO2, Arterial 115.00 (H) 80.0 - 100.0 mmHg   Bicarbonate 22.3 20.0 - 24.0 mEq/L   Acid-base deficit 2.7 (H) 0.0 - 2.0 mmol/L   O2 Saturation 97.9 %   Collection site RIGHT RADIAL    Drawn by COLLECTED BY RT    Sample type ARTERIAL    Allens test (pass/fail) PASS PASS  Lactic acid, plasma     Status: Abnormal   Collection Time: 05/07/15  4:35 PM  Result Value Ref Range   Lactic Acid, Venous 3.8 (HH) 0.5 - 2.0 mmol/L    Comment: CRITICAL RESULT CALLED TO, READ BACK BY AND VERIFIED WITH: ROWE,N AT 1715 ON 05/07/15 BY ISLEY,B   Ammonia     Status: None   Collection Time: 05/07/15  4:35 PM  Result Value Ref Range   Ammonia 27 9 - 35 umol/L  Urine rapid drug screen (hosp performed)     Status: Abnormal   Collection Time: 05/07/15  5:04 PM  Result Value Ref Range   Opiates POSITIVE (A) NONE DETECTED   Cocaine NONE DETECTED NONE DETECTED   Benzodiazepines POSITIVE (A) NONE DETECTED   Amphetamines NONE DETECTED NONE DETECTED   Tetrahydrocannabinol NONE DETECTED NONE DETECTED   Barbiturates NONE DETECTED NONE DETECTED    Comment:        DRUG SCREEN FOR MEDICAL PURPOSES ONLY.  IF CONFIRMATION IS NEEDED FOR ANY PURPOSE, NOTIFY LAB WITHIN 5 DAYS.        LOWEST DETECTABLE LIMITS FOR URINE DRUG SCREEN Drug Class       Cutoff (ng/mL) Amphetamine      1000 Barbiturate      200 Benzodiazepine   163 Tricyclics       846 Opiates          300 Cocaine          300 THC              50   Lactic acid, plasma     Status: Abnormal   Collection Time: 05/07/15  8:05 PM  Result Value Ref Range   Lactic Acid, Venous 3.1 (HH) 0.5 - 2.0 mmol/L    Comment: CRITICAL RESULT CALLED TO, READ BACK BY AND VERIFIED WITH: WAGNER,R AT 2030 ON 05/07/2015 BY ISLEY,B   Lactic acid, plasma     Status: Abnormal   Collection Time: 05/07/15 10:51 PM  Result Value Ref Range   Lactic Acid,  Venous 2.7 (HH) 0.5 - 2.0 mmol/L    Comment: CRITICAL RESULT CALLED TO, READ BACK BY AND VERIFIED WITH: DANIELS J AT 2324 ON 122016 BY FORSYTH K   Lactic acid, plasma     Status: Abnormal   Collection Time: 05/08/15  2:13 AM  Result Value Ref Range   Lactic Acid, Venous 2.7 (HH) 0.5 - 2.0 mmol/L    Comment: CRITICAL RESULT CALLED TO, READ BACK BY AND VERIFIED WITH: WAGONER R 0250 ON [id number] BY FORSYTH K   Blood gas, arterial     Status: Abnormal   Collection Time: 05/08/15  5:35 AM  Result Value Ref Range   FIO2 50.00  Delivery systems VENTILATOR    Mode PRESSURE REGULATED VOLUME CONTROL    VT 540 mL   LHR 14 resp/min   Peep/cpap 6.0 cm H20   pH, Arterial 7.345 (L) 7.350 - 7.450   pCO2 arterial 44.5 35.0 - 45.0 mmHg   pO2, Arterial 143.0 (H) 80.0 - 100.0 mmHg   Bicarbonate 23.1 20.0 - 24.0 mEq/L   TCO2 18.9 0 - 100 mmol/L   Acid-base deficit 1.2 0.0 - 2.0 mmol/L   O2 Saturation 98.6 %   Patient temperature 37.0    Collection site RIGHT RADIAL    Drawn by [id number]    Sample type ARTERIAL    Allens test (pass/fail) PASS PASS    ABGS  Recent Labs  05/08/15 0535  PHART 7.345*  PO2ART 143.0*  TCO2 18.9  HCO3 23.1   CULTURES Recent Results (from the past 240 hour(s))  MRSA PCR Screening     Status: Abnormal   Collection Time: 05/06/15 10:15 AM  Result Value Ref Range Status   MRSA by PCR POSITIVE (A) NEGATIVE Final    Comment:        The GeneXpert MRSA Assay (FDA approved for NASAL specimens only), is one component of a comprehensive MRSA colonization surveillance program. It is not intended to diagnose MRSA infection nor to guide or monitor treatment for MRSA infections. RESULT CALLED TO, READ BACK BY AND VERIFIED WITH: SPANGLER,E. AT [place] ON 05/06/2015 BY BAUGHAM,M.    Studies/Results: Ct Head Wo Contrast  05/07/2015  CLINICAL DATA:  Altered mental status EXAM: CT HEAD WITHOUT CONTRAST TECHNIQUE: Contiguous axial images were obtained from the base of the  skull through the vertex without intravenous contrast. COMPARISON:  May 05, 2009 FINDINGS: The ventricles are normal in size and configuration. There is no intracranial mass, hemorrhage, extra-axial fluid collection, or midline shift. Gray-white compartments are normal. No acute infarct evident. The bony calvarium appears intact. The mastoid air cells are clear. There are no intraorbital lesions. IMPRESSION: Study within normal limits and stable. Electronically Signed   By: Lowella Grip III M.D.   On: 05/07/2015 16:22   Ct Angio Chest Pe W/cm &/or Wo Cm  05/07/2015  CLINICAL DATA:  COPD exacerbation. Productive cough. Lower extremity pain and swelling. EXAM: CT ANGIOGRAPHY CHEST WITH CONTRAST TECHNIQUE: Multidetector CT imaging of the chest was performed using the standard protocol during bolus administration of intravenous contrast. Multiplanar CT image reconstructions and MIPs were obtained to evaluate the vascular anatomy. CONTRAST:  100 mL OMNIPAQUE IOHEXOL 350 MG/ML SOLN COMPARISON:  Plain film of the chest 05/06/2015. CT chest 10/31/2013. FINDINGS: The study is somewhat degraded by respiratory motion and bolus timing. No pulmonary embolus is identified. There is mild cardiomegaly. No pleural or pericardial effusion. Surgical clips along the anterior margin of the distal esophagus are noted. There is no axillary, hilar or mediastinal lymphadenopathy. The lungs demonstrate dependent atelectasis. Scattered ground-glass attenuation is also identified. Visualized upper abdomen shows no focal abnormality. No focal bony abnormality is identified. Review of the MIP images confirms the above findings. IMPRESSION: Negative for pulmonary embolus. Cardiomegaly. Scattered ground-glass attenuation in the lungs could be due to atelectasis or possibly mild interstitial edema. Electronically Signed   By: Inge Rise M.D.   On: 05/07/2015 09:26   Dg Chest Port 1 View  05/08/2015  CLINICAL DATA:  ET tube  placement EXAM: PORTABLE CHEST 1 VIEW COMPARISON:  Two days ago FINDINGS: New endotracheal tube with tip at the clavicular heads. Cardiomegaly with congested appearance of  pulmonary vessels . No consolidation, edema, effusion, or pneumothorax. IMPRESSION: 1. New endotracheal tube is in good position. 2. Congested appearance of the pulmonary vessels, accentuated by low volumes. Electronically Signed   By: Monte Fantasia M.D.   On: 05/08/2015 04:23   Portable Chest 1 View  05/06/2015  CLINICAL DATA:  Respiratory failure. EXAM: PORTABLE CHEST 1 VIEW COMPARISON:  01/30/2015 chest radiograph. FINDINGS: Low lung volumes. There is apparent mild enlargement of the cardiac silhouette. The mediastinal contour is otherwise within normal limits. No pneumothorax. No pleural effusion. There are diffuse hazy and linear parahilar opacities bilaterally. IMPRESSION: 1. Apparent mild enlargement of the cardiac silhouette, which could be artifactual due to low lung volumes. 2. Diffuse hazy and linear parahilar opacities bilaterally, most suggestive of moderate pulmonary edema. Consider correlation with BNP and echocardiography to assess for congestive heart failure. Electronically Signed   By: Ilona Sorrel M.D.   On: 05/06/2015 10:41    Medications:  Prior to Admission:  Prescriptions prior to admission  Medication Sig Dispense Refill Last Dose  . albuterol (PROVENTIL HFA;VENTOLIN HFA) 108 (90 BASE) MCG/ACT inhaler Inhale 2 puffs into the lungs every 6 (six) hours as needed. wheezing 1 Inhaler 11 05/06/2015 at Unknown time  . albuterol (PROVENTIL) (5 MG/ML) 0.5% nebulizer solution Take 2.5 mg by nebulization every 6 (six) hours as needed for wheezing or shortness of breath.   unknown  . ALPRAZolam (XANAX) 1 MG tablet Take 1 mg by mouth 2 (two) times daily.   05/05/2015 at Unknown time  . Amantadine HCl 100 MG tablet Take 100 mg by mouth 2 (two) times daily.  0 05/05/2015 at Unknown time  . HYDROcodone-acetaminophen  (NORCO) 10-325 MG tablet Take 1 tablet by mouth every 6 (six) hours as needed for moderate pain or severe pain.   0 05/06/2015 at 130a  . levofloxacin (LEVAQUIN) 500 MG tablet Take 500 mg by mouth daily. 10 day course starting on 05/01/2015  0 05/05/2015 at Unknown time  . lidocaine (XYLOCAINE) 5 % ointment Apply 1 application topically as needed. (Patient taking differently: Apply 1 application topically as needed for mild pain or moderate pain. ) 35.44 g 0 Past Week at Unknown time  . pantoprazole (PROTONIX) 40 MG tablet Take 40 mg by mouth daily.   05/05/2015 at 2200  . polyethylene glycol (MIRALAX / GLYCOLAX) packet Take 17 g by mouth daily as needed for mild constipation.   unknown  . predniSONE (DELTASONE) 10 MG tablet Take 30 mg by mouth daily. To decrease to 38m daily for 3 days on 05/07/2015, etc , as directed per tapered instructions  0 05/06/2015 at Unknown time  . promethazine (PHENERGAN) 12.5 MG tablet Take 1 tablet by mouth every 4 (four) hours as needed for nausea or vomiting.   1 05/05/2015 at Unknown time   Scheduled: . azithromycin  500 mg Intravenous Q24H  . cefTRIAXone (ROCEPHIN)  IV  1 g Intravenous Q24H  . Chlorhexidine Gluconate Cloth  6 each Topical Q0600  . enoxaparin (LOVENOX) injection  40 mg Subcutaneous Q24H  . ipratropium-albuterol  3 mL Nebulization Q6H  . levothyroxine  50 mcg Oral QAC breakfast  . methylPREDNISolone (SOLU-MEDROL) injection  80 mg Intravenous Q6H  . mupirocin ointment  1 application Nasal BID  . pantoprazole (PROTONIX) IV  40 mg Intravenous Q24H   Continuous: . sodium chloride 75 mL/hr at 05/07/15 2023  . albuterol 10 mg/hr (05/07/15 1442)  . propofol (DIPRIVAN) infusion 50 mcg/kg/min (05/08/15 0400)   PRN:[DISCONTINUED] acetaminophen **  OR** acetaminophen, albuterol, midazolam, naLOXone (NARCAN)  injection, ondansetron **OR** ondansetron (ZOFRAN) IV, polyethylene glycol  Assesment: She was admitted with community-acquired pneumonia. She also  has some element of asthma/COPD. She has had a previous instance of acute lung injury requiring intubation and mechanical ventilation that has left her with some baseline lung disease. She has obstructive sleep apnea and is noncompliant with C Pap. She is now intubated and on the ventilator. She is on propofol and calm at this point. I don't think she is ready to come off the ventilator and we'll not make any attempt at weaning today. Once she comes off the ventilator I like to be sure that any home medications are not accessible. Active Problems:   CAP (community acquired pneumonia)   Asthma with acute exacerbation   Respiratory failure (HCC)   OSA (obstructive sleep apnea)   Bradycardia    Plan: Continue mechanical ventilation for now. Continue IV antibiotics. Continue IV steroids.    LOS: 2 days   Judia Arnott L 05/08/2015, 7:39 AM

## 2015-05-08 NOTE — Progress Notes (Signed)
Initial Nutrition Assessment  DOCUMENTATION CODES:   Obesity unspecified  INTERVENTION:   If unable to wean in 24-48 hr recommend and propofol is not required: Currently propofol is providing 100+% of est energy needs and 0% protein.  Initiate Vital High Protein @ 20 ml/hr via OGT  and increase by 10 ml every 4 hours to goal rate of 40 ml/hr.   30 ml Prostat BID via tube (200 kcal, 30 gr protein).    Tube feeding regimen provides 1160 kcal (100% of energy and 89% protein needs), 114 grams of protein, and 802 ml of H2O.    NUTRITION DIAGNOSIS:   Inadequate oral intake related to inability to eat as evidenced by NPO status.   GOAL:   Provide needs based on ASPEN/SCCM guidelines   MONITOR:   Labs, Weight trends, Vent status  REASON FOR ASSESSMENT:   Ventilator    ASSESSMENT:  Patient is currently intubated on ventilator support MV: 8.9 L/min Temp (24hrs), Avg:98.1 F (36.7 C), Min:97.2 F (36.2 C), Max:98.9 F (37.2 C)  Propofol: 46.7 ml/hr providing 1233 kcal every 24 hr at current rate   Diet Order:  Diet NPO time specified  Skin:   intact  Last BM:   12/20   Height:   Ht Readings from Last 1 Encounters:  05/08/15 5\' 4"  (1.626 m)    Weight:   Wt Readings from Last 1 Encounters:  05/08/15 245 lb 13 oz (111.5 kg)    Ideal Body Weight:  54.5 kg  BMI:  Body mass index is 42.17 kg/(m^2).  Estimated Nutritional Needs:   Kcal:  [id number] (hypocaloric feeding goal 1162 )  Protein:  128 gr protein  Fluid:  1.7 liters fluid daily  EDUCATION NEEDS:   No education needs identified at this time  Colman Cater MS,RD,CSG,LDN Office: [phone] Pager: [phone]

## 2015-05-08 NOTE — Care Management Note (Signed)
Case Management Note  Patient Details  Name: Denise Ray MRN: [id number] Date of Birth: 1970-10-12  Subjective/Objective:                  Pt is from home and ind with ADL's. Pt admitted with resp failure and is currently intubated. Unable to complete further assessment.   Action/Plan: Anticipate pt will return home with self care. Will cont to follow for DC planning as pt progresses.   Expected Discharge Date:    05/12/2015              Expected Discharge Plan:    home with self care  In-House Referral:  NA  Discharge planning Services  CM Consult  Post Acute Care Choice:  NA Choice offered to:  NA  DME Arranged:    DME Agency:     HH Arranged:    HH Agency:     Status of Service:  In process, will continue to follow  Medicare Important Message Given:    Date Medicare IM Given:    Medicare IM give by:    Date Additional Medicare IM Given:    Additional Medicare Important Message give by:     If discussed at [place] of Stay Meetings, dates discussed:    Additional Comments:  Sherald Barge, RN 05/08/2015, 11:19 AM

## 2015-05-08 NOTE — Progress Notes (Signed)
[facility] Progress Note Patient Name: Denise Ray DOB: 06/06/1970 MRN: [id number]   Date of Service  05/08/2015  HPI/Events of Note  Review of CXR for ETT position. ETT tip is about 2.5 cm above the carina.   eICU Interventions  ETT in satisfactory position.      Intervention Category Major Interventions: Respiratory failure - evaluation and management  Sommer,Steven Eugene 05/08/2015, 4:27 AM

## 2015-05-08 NOTE — Progress Notes (Signed)
[facility] Progress Note Patient Name: Denise Ray DOB: 09-Dec-1970 MRN: [id number]   Date of Service  05/08/2015  HPI/Events of Note  Patient with [facility] with respiratory distress. Given Ultram about 1 hour ago. Patient has been put back on her BiPAP. Had similar episode earlier today which was reversed with Narcan.   eICU Interventions  Will order: 1. Narcan 0.4 mg IV now.  2. D/C Ultram. 3. If she wakes up, will check ABG.     Intervention Category Intermediate Interventions: Change in mental status - evaluation and management;Respiratory distress - evaluation and management  Sommer,Steven Eugene 05/08/2015, 3:15 AM

## 2015-05-09 ENCOUNTER — Inpatient Hospital Stay (HOSPITAL_COMMUNITY): Payer: 59

## 2015-05-09 LAB — BLOOD GAS, ARTERIAL
Acid-Base Excess: 1.2 mmol/L (ref 0.0–2.0)
BICARBONATE: 25.5 meq/L — AB (ref 20.0–24.0)
Drawn by: 213101
FIO2: 0.4
LHR: 14 {breaths}/min
O2 Saturation: 97.8 %
PEEP/CPAP: 5 cmH2O
PO2 ART: 112 mmHg — AB (ref 80.0–100.0)
VT: 540 mL
pCO2 arterial: 38.6 mmHg (ref 35.0–45.0)
pH, Arterial: 7.428 (ref 7.350–7.450)

## 2015-05-09 LAB — GLUCOSE, CAPILLARY
GLUCOSE-CAPILLARY: 136 mg/dL — AB (ref 65–99)
GLUCOSE-CAPILLARY: 130 mg/dL — AB (ref 65–99)
Glucose-Capillary: 134 mg/dL — ABNORMAL HIGH (ref 65–99)
Glucose-Capillary: 142 mg/dL — ABNORMAL HIGH (ref 65–99)
Glucose-Capillary: 155 mg/dL — ABNORMAL HIGH (ref 65–99)
Glucose-Capillary: 165 mg/dL — ABNORMAL HIGH (ref 65–99)

## 2015-05-09 LAB — TRIGLYCERIDES: Triglycerides: 993 mg/dL — ABNORMAL HIGH (ref <150)

## 2015-05-09 IMAGING — CR DG CHEST 1V PORT
1 series · 1 of 1 positions shown · non-contrast
Comparison: Chest radiograph from one day prior.

CLINICAL DATA: Intubated.  Respiratory failure

EXAM:
PORTABLE CHEST 1 VIEW

[ap]
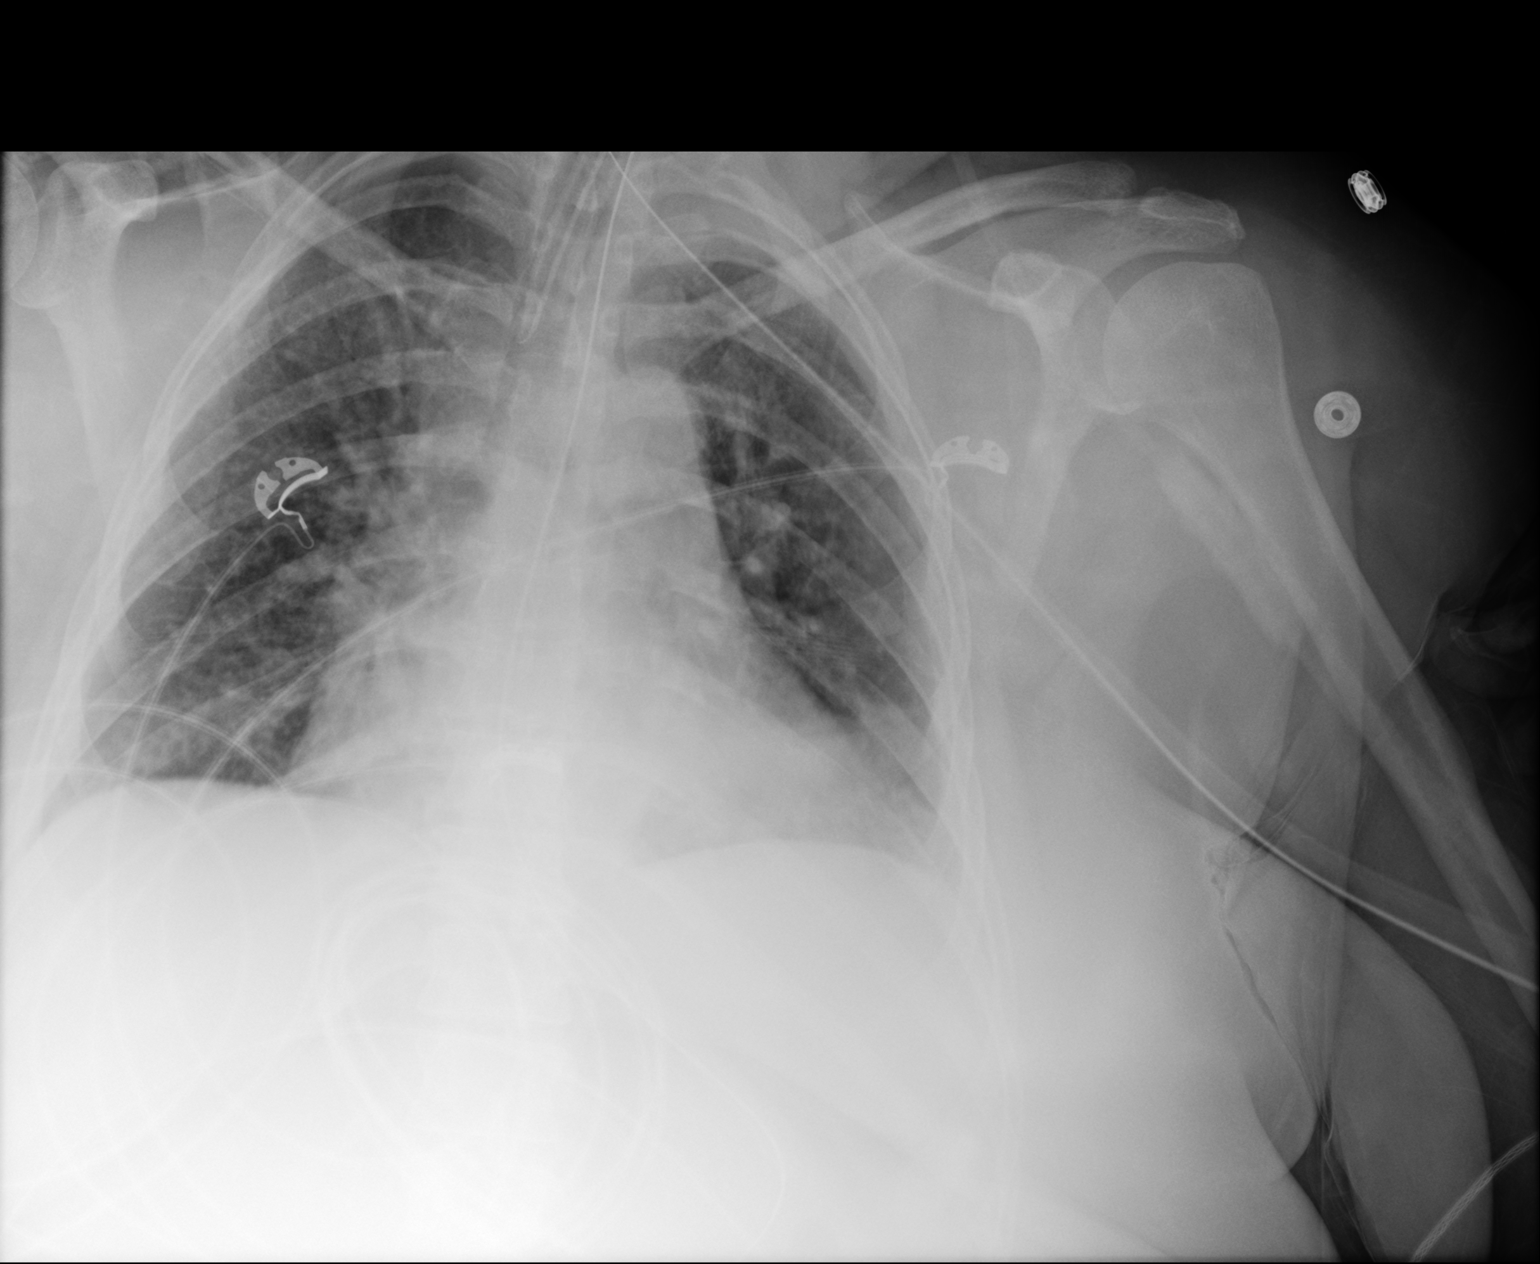

[1 of 1 positions shown; findings below may reference images not displayed]

FINDINGS: Endotracheal tube tip is 3.1 cm above the carina. Enteric tube
enters the stomach with the tip not seen on this image. Stable
cardiomediastinal silhouette with mild cardiomegaly. No
pneumothorax. No pleural effusion. Stable mild to moderate pulmonary
edema.
IMPRESSION: Stable mild-to-moderate congestive heart failure. Well-positioned
endotracheal and enteric tubes.

## 2015-05-09 MED ORDER — VITAL HIGH PROTEIN PO LIQD
1000.0000 mL | ORAL | Status: DC
  Administered 2015-05-09 (×2): 1000 mL
  Filled 2015-05-09 (×2): qty 1000

## 2015-05-09 MED ORDER — VITAL HIGH PROTEIN PO LIQD
1000.0000 mL | ORAL | Status: DC
  Administered 2015-05-10: 1000 mL
  Filled 2015-05-09 (×4): qty 1000

## 2015-05-09 MED ORDER — FUROSEMIDE 10 MG/ML IJ SOLN
40.0000 mg | Freq: Once | INTRAMUSCULAR | Status: AC
  Administered 2015-05-09: 40 mg via INTRAVENOUS
  Filled 2015-05-09: qty 4

## 2015-05-09 NOTE — Progress Notes (Signed)
Subjective: She remains intubated and on the ventilator. Her oxygenation is better. Her chest x-ray is essentially the same. She has not had any new problems noted.  Objective: Vital signs in last 24 hours: Temp:  [97 F (36.1 C)-97.8 F (36.6 C)] 97.2 F (36.2 C) (12/22 0400) Pulse Rate:  [56-95] 68 (12/22 0100) Resp:  [14-19] 14 (12/22 0100) BP: (103-147)/(50-113) 136/74 mmHg (12/22 0100) SpO2:  [82 %-100 %] 100 % (12/22 0132) FiO2 (%):  [40 %-50 %] 40 % (12/22 0333) Weight:  [112 kg (246 lb 14.6 oz)] 112 kg (246 lb 14.6 oz) (12/22 0500) Weight change: 0.5 kg (1 lb 1.6 oz) Last BM Date: 05/07/15  Intake/Output from previous day: 12/21 0701 - 12/22 0700 In: 2641 [I.V.:2641] Out: 2400 [Urine:2400]  PHYSICAL EXAM General appearance: Intubated sedated obese Resp: rhonchi bilaterally Cardio: regular rate and rhythm, S1, S2 normal, no murmur, click, rub or gallop GI: soft, non-tender; bowel sounds normal; no masses,  no organomegaly Extremities: Trace edema  Lab Results:  Results for orders placed or performed during the hospital encounter of 05/06/15 (from the past 48 hour(s))  Glucose, capillary     Status: Abnormal   Collection Time: 05/07/15  3:33 PM  Result Value Ref Range   Glucose-Capillary 183 (H) 65 - 99 mg/dL   Comment 1 Notify RN    Comment 2 Document in Chart   Blood gas, arterial     Status: Abnormal   Collection Time: 05/07/15  3:49 PM  Result Value Ref Range   FIO2 30.00    Delivery systems BILEVEL POSITIVE AIRWAY PRESSURE    Inspiratory PAP 10.00    Expiratory PAP 5.00    pH, Arterial 7.379 7.350 - 7.450   pCO2 arterial 37.4 35.0 - 45.0 mmHg   pO2, Arterial 115.00 (H) 80.0 - 100.0 mmHg   Bicarbonate 22.3 20.0 - 24.0 mEq/L   Acid-base deficit 2.7 (H) 0.0 - 2.0 mmol/L   O2 Saturation 97.9 %   Collection site RIGHT RADIAL    Drawn by COLLECTED BY RT    Sample type ARTERIAL    Allens test (pass/fail) PASS PASS  Lactic acid, plasma     Status: Abnormal    Collection Time: 05/07/15  4:35 PM  Result Value Ref Range   Lactic Acid, Venous 3.8 (HH) 0.5 - 2.0 mmol/L    Comment: CRITICAL RESULT CALLED TO, READ BACK BY AND VERIFIED WITH: ROWE,N AT 1715 ON 05/07/15 BY ISLEY,B   Ammonia     Status: None   Collection Time: 05/07/15  4:35 PM  Result Value Ref Range   Ammonia 27 9 - 35 umol/L  Urine rapid drug screen (hosp performed)     Status: Abnormal   Collection Time: 05/07/15  5:04 PM  Result Value Ref Range   Opiates POSITIVE (A) NONE DETECTED   Cocaine NONE DETECTED NONE DETECTED   Benzodiazepines POSITIVE (A) NONE DETECTED   Amphetamines NONE DETECTED NONE DETECTED   Tetrahydrocannabinol NONE DETECTED NONE DETECTED   Barbiturates NONE DETECTED NONE DETECTED    Comment:        DRUG SCREEN FOR MEDICAL PURPOSES ONLY.  IF CONFIRMATION IS NEEDED FOR ANY PURPOSE, NOTIFY LAB WITHIN 5 DAYS.        LOWEST DETECTABLE LIMITS FOR URINE DRUG SCREEN Drug Class       Cutoff (ng/mL) Amphetamine      1000 Barbiturate      200 Benzodiazepine   [id number] Tricyclics       [id number]  Opiates          300 Cocaine          300 THC              50   Lactic acid, plasma     Status: Abnormal   Collection Time: 05/07/15  8:05 PM  Result Value Ref Range   Lactic Acid, Venous 3.1 (HH) 0.5 - 2.0 mmol/L    Comment: CRITICAL RESULT CALLED TO, READ BACK BY AND VERIFIED WITH: WAGNER,R AT 2030 ON 05/07/2015 BY ISLEY,B   Lactic acid, plasma     Status: Abnormal   Collection Time: 05/07/15 10:51 PM  Result Value Ref Range   Lactic Acid, Venous 2.7 (HH) 0.5 - 2.0 mmol/L    Comment: CRITICAL RESULT CALLED TO, READ BACK BY AND VERIFIED WITH: DANIELS J AT 2324 ON [id number] BY FORSYTH K   Lactic acid, plasma     Status: Abnormal   Collection Time: 05/08/15  2:13 AM  Result Value Ref Range   Lactic Acid, Venous 2.7 (HH) 0.5 - 2.0 mmol/L    Comment: CRITICAL RESULT CALLED TO, READ BACK BY AND VERIFIED WITH: WAGONER R 0250 ON [id number] BY FORSYTH K   Blood gas, arterial      Status: Abnormal   Collection Time: 05/08/15  5:35 AM  Result Value Ref Range   FIO2 50.00    Delivery systems VENTILATOR    Mode PRESSURE REGULATED VOLUME CONTROL    VT 540 mL   LHR 14 resp/min   Peep/cpap 6.0 cm H20   pH, Arterial 7.345 (L) 7.350 - 7.450   pCO2 arterial 44.5 35.0 - 45.0 mmHg   pO2, Arterial 143.0 (H) 80.0 - 100.0 mmHg   Bicarbonate 23.1 20.0 - 24.0 mEq/L   TCO2 18.9 0 - 100 mmol/L   Acid-base deficit 1.2 0.0 - 2.0 mmol/L   O2 Saturation 98.6 %   Patient temperature 37.0    Collection site RIGHT RADIAL    Drawn by [id number]    Sample type ARTERIAL    Allens test (pass/fail) PASS PASS  Glucose, capillary     Status: Abnormal   Collection Time: 05/08/15  7:55 AM  Result Value Ref Range   Glucose-Capillary 128 (H) 65 - 99 mg/dL   Comment 1 Notify RN    Comment 2 Document in Chart   Glucose, capillary     Status: Abnormal   Collection Time: 05/08/15 12:13 PM  Result Value Ref Range   Glucose-Capillary 139 (H) 65 - 99 mg/dL   Comment 1 Notify RN    Comment 2 Document in Chart   Glucose, capillary     Status: Abnormal   Collection Time: 05/08/15  4:45 PM  Result Value Ref Range   Glucose-Capillary 127 (H) 65 - 99 mg/dL   Comment 1 Notify RN    Comment 2 Document in Chart   Glucose, capillary     Status: Abnormal   Collection Time: 05/08/15  8:00 PM  Result Value Ref Range   Glucose-Capillary 154 (H) 65 - 99 mg/dL  Glucose, capillary     Status: Abnormal   Collection Time: 05/09/15 12:32 AM  Result Value Ref Range   Glucose-Capillary 142 (H) 65 - 99 mg/dL  Triglycerides     Status: Abnormal   Collection Time: 05/09/15  4:35 AM  Result Value Ref Range   Triglycerides 993 (H) <150 mg/dL  Blood gas, arterial     Status: Abnormal   Collection Time: 05/09/15  4:46 AM  Result Value Ref Range   FIO2 0.40    Delivery systems VENTILATOR    Mode PRESSURE REGULATED VOLUME CONTROL    VT 540 mL   LHR 14.0 resp/min   Peep/cpap 5.0 cm H20   pH, Arterial 7.428 7.350  - 7.450   pCO2 arterial 38.6 35.0 - 45.0 mmHg   pO2, Arterial 112 (H) 80.0 - 100.0 mmHg   Bicarbonate 25.5 (H) 20.0 - 24.0 mEq/L   Acid-Base Excess 1.2 0.0 - 2.0 mmol/L   O2 Saturation 97.8 %   Collection site RIGHT RADIAL    Drawn by [id number]    Sample type ARTERIAL DRAW    Allens test (pass/fail) PASS PASS  Glucose, capillary     Status: Abnormal   Collection Time: 05/09/15  4:48 AM  Result Value Ref Range   Glucose-Capillary 136 (H) 65 - 99 mg/dL    ABGS  Recent Labs  05/08/15 0535 05/09/15 0446  PHART 7.345* 7.428  PO2ART 143.0* 112*  TCO2 18.9  --   HCO3 23.1 25.5*   CULTURES Recent Results (from the past 240 hour(s))  MRSA PCR Screening     Status: Abnormal   Collection Time: 05/06/15 10:15 AM  Result Value Ref Range Status   MRSA by PCR POSITIVE (A) NEGATIVE Final    Comment:        The GeneXpert MRSA Assay (FDA approved for NASAL specimens only), is one component of a comprehensive MRSA colonization surveillance program. It is not intended to diagnose MRSA infection nor to guide or monitor treatment for MRSA infections. RESULT CALLED TO, READ BACK BY AND VERIFIED WITH: SPANGLER,E. AT [place] ON 05/06/2015 BY BAUGHAM,M.    Studies/Results: Ct Head Wo Contrast  05/07/2015  CLINICAL DATA:  Altered mental status EXAM: CT HEAD WITHOUT CONTRAST TECHNIQUE: Contiguous axial images were obtained from the base of the skull through the vertex without intravenous contrast. COMPARISON:  May 05, 2009 FINDINGS: The ventricles are normal in size and configuration. There is no intracranial mass, hemorrhage, extra-axial fluid collection, or midline shift. Gray-white compartments are normal. No acute infarct evident. The bony calvarium appears intact. The mastoid air cells are clear. There are no intraorbital lesions. IMPRESSION: Study within normal limits and stable. Electronically Signed   By: Lowella Grip III M.D.   On: 05/07/2015 16:22   Ct Angio Chest Pe W/cm &/or  Wo Cm  05/07/2015  CLINICAL DATA:  COPD exacerbation. Productive cough. Lower extremity pain and swelling. EXAM: CT ANGIOGRAPHY CHEST WITH CONTRAST TECHNIQUE: Multidetector CT imaging of the chest was performed using the standard protocol during bolus administration of intravenous contrast. Multiplanar CT image reconstructions and MIPs were obtained to evaluate the vascular anatomy. CONTRAST:  100 mL OMNIPAQUE IOHEXOL 350 MG/ML SOLN COMPARISON:  Plain film of the chest 05/06/2015. CT chest 10/31/2013. FINDINGS: The study is somewhat degraded by respiratory motion and bolus timing. No pulmonary embolus is identified. There is mild cardiomegaly. No pleural or pericardial effusion. Surgical clips along the anterior margin of the distal esophagus are noted. There is no axillary, hilar or mediastinal lymphadenopathy. The lungs demonstrate dependent atelectasis. Scattered ground-glass attenuation is also identified. Visualized upper abdomen shows no focal abnormality. No focal bony abnormality is identified. Review of the MIP images confirms the above findings. IMPRESSION: Negative for pulmonary embolus. Cardiomegaly. Scattered ground-glass attenuation in the lungs could be due to atelectasis or possibly mild interstitial edema. Electronically Signed   By: Inge Rise M.D.   On: 05/07/2015 09:26  Dg Chest Port 1 View  05/08/2015  CLINICAL DATA:  ET tube placement EXAM: PORTABLE CHEST 1 VIEW COMPARISON:  Two days ago FINDINGS: New endotracheal tube with tip at the clavicular heads. Cardiomegaly with congested appearance of pulmonary vessels . No consolidation, edema, effusion, or pneumothorax. IMPRESSION: 1. New endotracheal tube is in good position. 2. Congested appearance of the pulmonary vessels, accentuated by low volumes. Electronically Signed   By: Monte Fantasia M.D.   On: 05/08/2015 04:23    Medications:  Prior to Admission:  Prescriptions prior to admission  Medication Sig Dispense Refill Last  Dose  . albuterol (PROVENTIL HFA;VENTOLIN HFA) 108 (90 BASE) MCG/ACT inhaler Inhale 2 puffs into the lungs every 6 (six) hours as needed. wheezing 1 Inhaler 11 05/06/2015 at Unknown time  . albuterol (PROVENTIL) (5 MG/ML) 0.5% nebulizer solution Take 2.5 mg by nebulization every 6 (six) hours as needed for wheezing or shortness of breath.   unknown  . ALPRAZolam (XANAX) 1 MG tablet Take 1 mg by mouth 2 (two) times daily.   05/05/2015 at Unknown time  . Amantadine HCl 100 MG tablet Take 100 mg by mouth 2 (two) times daily.  0 05/05/2015 at Unknown time  . HYDROcodone-acetaminophen (NORCO) 10-325 MG tablet Take 1 tablet by mouth every 6 (six) hours as needed for moderate pain or severe pain.   0 05/06/2015 at 130a  . levofloxacin (LEVAQUIN) 500 MG tablet Take 500 mg by mouth daily. 10 day course starting on 05/01/2015  0 05/05/2015 at Unknown time  . lidocaine (XYLOCAINE) 5 % ointment Apply 1 application topically as needed. (Patient taking differently: Apply 1 application topically as needed for mild pain or moderate pain. ) 35.44 g 0 Past Week at Unknown time  . pantoprazole (PROTONIX) 40 MG tablet Take 40 mg by mouth daily.   05/05/2015 at 2200  . polyethylene glycol (MIRALAX / GLYCOLAX) packet Take 17 g by mouth daily as needed for mild constipation.   unknown  . predniSONE (DELTASONE) 10 MG tablet Take 30 mg by mouth daily. To decrease to 20mg  daily for 3 days on 05/07/2015, etc , as directed per tapered instructions  0 05/06/2015 at Unknown time  . promethazine (PHENERGAN) 12.5 MG tablet Take 1 tablet by mouth every 4 (four) hours as needed for nausea or vomiting.   1 05/05/2015 at Unknown time   Scheduled: . antiseptic oral rinse  7 mL Mouth Rinse QID  . azithromycin  500 mg Intravenous Q24H  . cefTRIAXone (ROCEPHIN)  IV  1 g Intravenous Q24H  . chlorhexidine gluconate  15 mL Mouth Rinse BID  . Chlorhexidine Gluconate Cloth  6 each Topical Q0600  . enoxaparin (LOVENOX) injection  40 mg  Subcutaneous Q24H  . ipratropium-albuterol  3 mL Nebulization Q6H  . levothyroxine  50 mcg Oral QAC breakfast  . methylPREDNISolone (SOLU-MEDROL) injection  80 mg Intravenous Q6H  . mupirocin ointment  1 application Nasal BID  . pantoprazole (PROTONIX) IV  40 mg Intravenous Q24H   Continuous: . sodium chloride 75 mL/hr at 05/09/15 0100  . albuterol 10 mg/hr (05/07/15 1442)  . propofol (DIPRIVAN) infusion 70 mcg/kg/min (05/09/15 0717)   PRN:[DISCONTINUED] acetaminophen **OR** acetaminophen, albuterol, midazolam, naLOXone (NARCAN)  injection, ondansetron **OR** ondansetron (ZOFRAN) IV, polyethylene glycol  Assesment: She was admitted with community-acquired pneumonia. She also has asthma with acute exacerbation. She had a previous episode of respiratory dependent respiratory failure and has some lung injury related to that. She has obstructive sleep apnea which is not  treated at home because she is noncompliant. There is some question about whether she took some of her home medications leading to her worsening mental status but it may simply be that she was encephalopathic from her illness. Active Problems:   CAP (community acquired pneumonia)   Asthma with acute exacerbation   Respiratory failure (HCC)   OSA (obstructive sleep apnea)   Bradycardia    Plan: Since she has trace edema I'm going to go ahead and give her a dose of Lasix. We can try to see if we can get her off the ventilator today. If she's not able to wean successfully she will need to start tube feedings. Recheck labs x-rays etc. in the morning.    LOS: 3 days   Chanae Gemma L 05/09/2015, 7:37 AM

## 2015-05-09 NOTE — Progress Notes (Signed)
PICC line education sheet given to patients mother.

## 2015-05-09 NOTE — Progress Notes (Signed)
Initial Nutrition Assessment  DOCUMENTATION CODES:   Obesity unspecified  INTERVENTION:  Vital High Protein @ 10 ml/hr via OGT. Pt unable to tolerate decreased propofol rate at this time per nursing. Therefore unable to meet protein needs currently.   Tube feeding regimen provides 240 kcal, 21 grams of protein, and 200 ml of H2O.    NUTRITION DIAGNOSIS:   Inadequate oral intake related to inability to eat as evidenced by NPO status.   GOAL:   Provide needs based on ASPEN/SCCM guidelines   MONITOR:   Labs, Weight trends, Vent status  REASON FOR ASSESSMENT:   Ventilator/ Consult to manage tube feeding    ASSESSMENT:  Patient is currently intubated on ventilator support.  MV: 8.9 L/min Temp (24hrs), Avg:97.2 F (36.2 C), Min:97 F (36.1 C), Max:97.5 F (36.4 C)  Propofol: 46.7 ml/hr providing 1233 kcal every 24 hr at current rate Labs: glucose 134   Diet Order:  Diet NPO time specified  Skin:   intact  Last BM:   12/20   Height:   Ht Readings from Last 1 Encounters:  05/09/15 5\' 4"  (1.626 m)    Weight:   Wt Readings from Last 1 Encounters:  05/09/15 246 lb 14.6 oz (112 kg)    Ideal Body Weight:  54.5 kg  BMI:  Body mass index is 42.36 kg/(m^2).  Estimated Nutritional Needs:   Kcal:  [id number] (hypocaloric feeding goal 1162 )  Protein:  128 gr protein  Fluid:  1.7 liters fluid daily  EDUCATION NEEDS:   No education needs identified at this time  Colman Cater MS,RD,CSG,LDN Office: [phone] Pager: [phone]

## 2015-05-10 ENCOUNTER — Inpatient Hospital Stay (HOSPITAL_COMMUNITY): Payer: 59

## 2015-05-10 LAB — BASIC METABOLIC PANEL
ANION GAP: 11 (ref 5–15)
BUN: 22 mg/dL — ABNORMAL HIGH (ref 6–20)
CALCIUM: 8.6 mg/dL — AB (ref 8.9–10.3)
CHLORIDE: 98 mmol/L — AB (ref 101–111)
CO2: 28 mmol/L (ref 22–32)
Creatinine, Ser: 0.68 mg/dL (ref 0.44–1.00)
GFR calc non Af Amer: >60 mL/min (ref >60)
GLUCOSE: 135 mg/dL — AB (ref 65–99)
POTASSIUM: 4.1 mmol/L (ref 3.5–5.1)
Sodium: 137 mmol/L (ref 135–145)

## 2015-05-10 LAB — BLOOD GAS, ARTERIAL
Acid-Base Excess: 4.1 mmol/L — ABNORMAL HIGH (ref 0.0–2.0)
Acid-Base Excess: 4.5 mmol/L — ABNORMAL HIGH (ref 0.0–2.0)
BICARBONATE: 27.9 meq/L — AB (ref 20.0–24.0)
Bicarbonate: 28 mEq/L — ABNORMAL HIGH (ref 20.0–24.0)
Drawn by: 317771
Drawn by: 234301
FIO2: 0.55
FIO2: 40
LHR: 14 {breaths}/min
Mode: POSITIVE
O2 SAT: 98.9 %
O2 SAT: 96.2 %
PEEP/CPAP: 5 cmH2O
PEEP: 5 cmH2O
PO2 ART: 89.7 mmHg (ref 80.0–100.0)
Pressure support: 5 cmH2O
TCO2: 17.8 mmol/L (ref 0–100)
VT: 540 mL
pCO2 arterial: 43.1 mmHg (ref 35.0–45.0)
pCO2 arterial: 45.2 mmHg — ABNORMAL HIGH (ref 35.0–45.0)
pH, Arterial: 7.432 (ref 7.350–7.450)
pH, Arterial: 7.419 (ref 7.350–7.450)
pO2, Arterial: 150 mmHg — ABNORMAL HIGH (ref 80.0–100.0)

## 2015-05-10 LAB — CBC WITH DIFFERENTIAL/PLATELET
Basophils Absolute: 0 10*3/uL (ref 0.0–0.1)
Basophils Relative: 0 %
EOS PCT: 0 %
Eosinophils Absolute: 0 10*3/uL (ref 0.0–0.7)
HCT: 37 % (ref 36.0–46.0)
Hemoglobin: 12.1 g/dL (ref 12.0–15.0)
LYMPHS ABS: 1.5 10*3/uL (ref 0.7–4.0)
LYMPHS PCT: 12 %
MCH: 30.3 pg (ref 26.0–34.0)
MCHC: 32.7 g/dL (ref 30.0–36.0)
MCV: 92.5 fL (ref 78.0–100.0)
MONO ABS: 0.8 10*3/uL (ref 0.1–1.0)
MONOS PCT: 6 %
Neutro Abs: 10.7 10*3/uL — ABNORMAL HIGH (ref 1.7–7.7)
Neutrophils Relative %: 82 %
PLATELETS: 285 10*3/uL (ref 150–400)
RBC: 4 MIL/uL (ref 3.87–5.11)
RDW: 13.4 % (ref 11.5–15.5)
WBC: 13 10*3/uL — ABNORMAL HIGH (ref 4.0–10.5)

## 2015-05-10 LAB — GLUCOSE, CAPILLARY
GLUCOSE-CAPILLARY: 137 mg/dL — AB (ref 65–99)
GLUCOSE-CAPILLARY: 160 mg/dL — AB (ref 65–99)
Glucose-Capillary: 143 mg/dL — ABNORMAL HIGH (ref 65–99)
Glucose-Capillary: 149 mg/dL — ABNORMAL HIGH (ref 65–99)
Glucose-Capillary: 144 mg/dL — ABNORMAL HIGH (ref 65–99)
Glucose-Capillary: 155 mg/dL — ABNORMAL HIGH (ref 65–99)

## 2015-05-10 IMAGING — CR DG CHEST 1V PORT
1 series · 1 of 1 positions shown · non-contrast
Comparison: Portable exam [YZ] hours compared to [DATE]

CLINICAL DATA: On ventilator, respiratory failure

EXAM:
PORTABLE CHEST 1 VIEW

[ap]
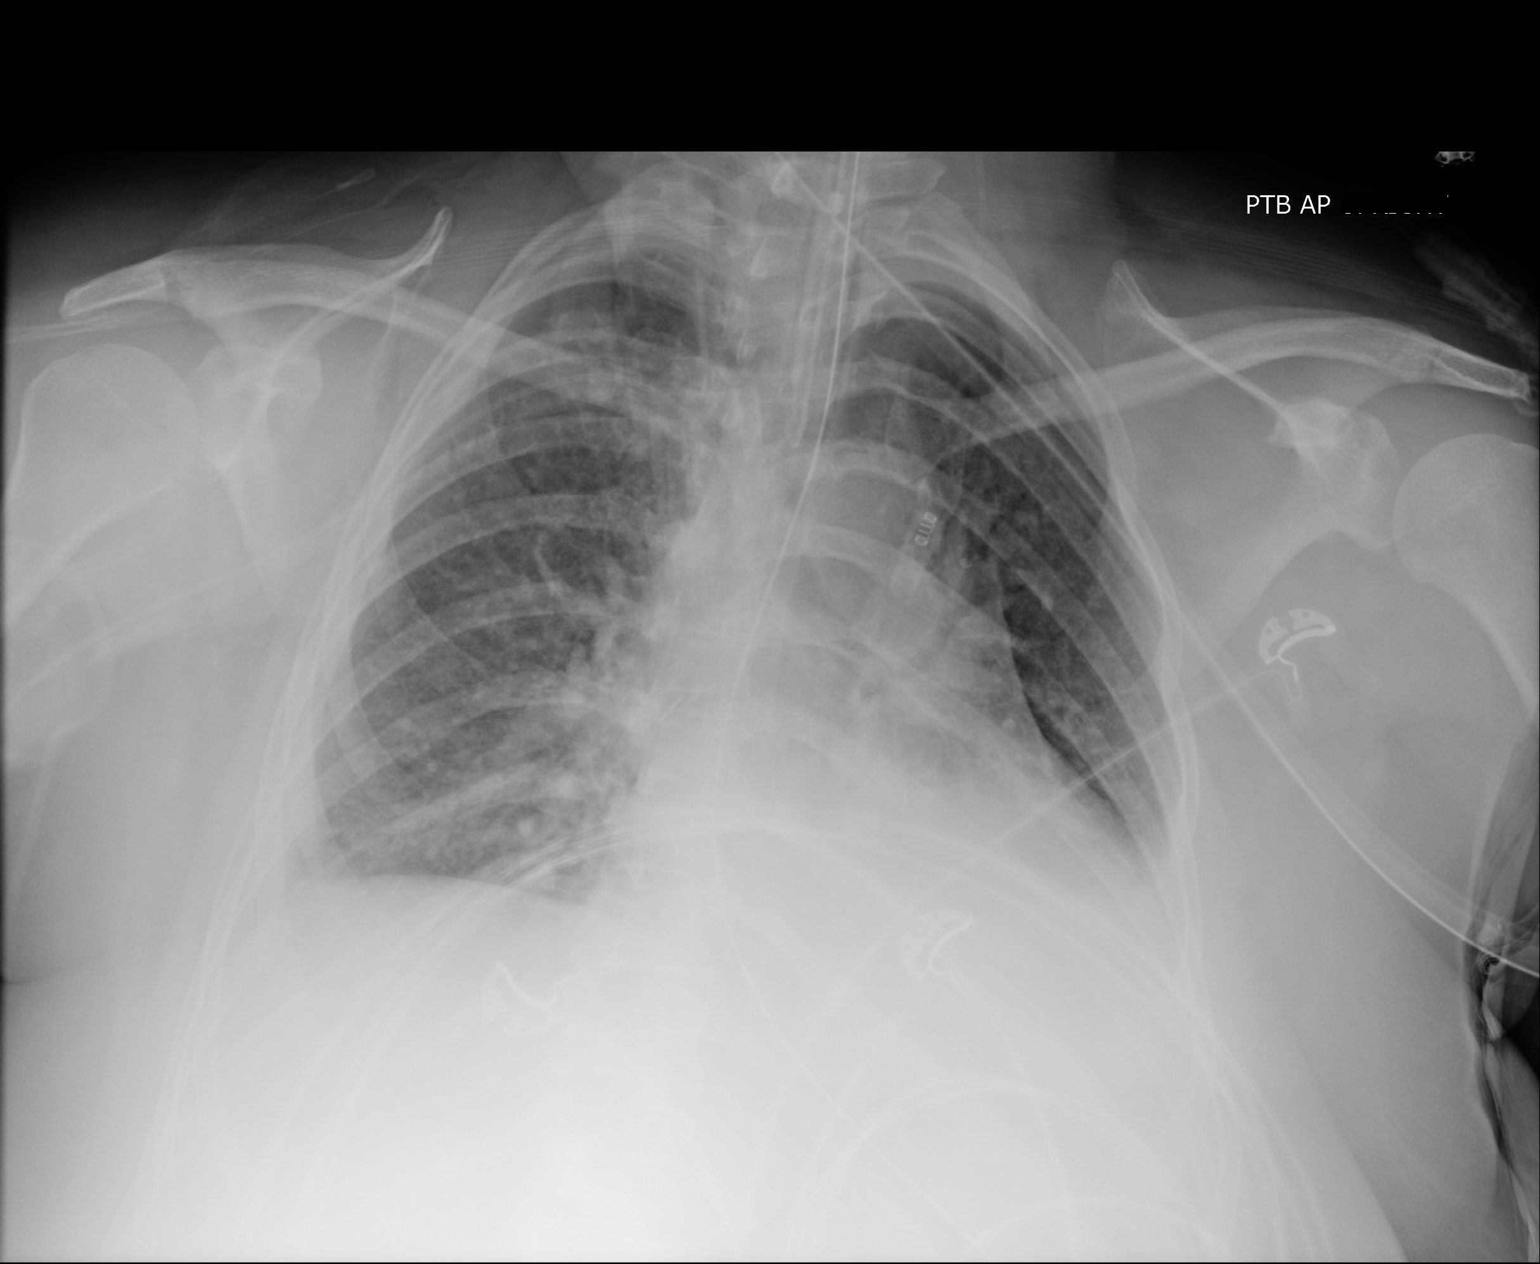

[1 of 1 positions shown; findings below may reference images not displayed]

FINDINGS: Tip of endotracheal tube projects 3.8 cm above carina.

Nasogastric tube extends into stomach.

EKG leads project over chest.

Enlargement of cardiac silhouette with pulmonary vascular
congestion.

Mild perihilar infiltrates question pulmonary edema.

More significant opacity identified in LEFT lower lobe question
atelectasis versus infiltrate.

No pneumothorax.

Small LEFT pleural effusion questioned.

No acute osseous findings.
IMPRESSION: Probable mild pulmonary edema.

Question coexistent atelectasis versus consolidation LEFT lower lobe
with suspected small LEFT pleural effusion.

## 2015-05-10 MED ORDER — DEXMEDETOMIDINE HCL IN NACL 200 MCG/50ML IV SOLN: 0.4000 ug/kg/h | INTRAVENOUS | Status: DC

## 2015-05-10 MED ORDER — FUROSEMIDE 10 MG/ML IJ SOLN
40.0000 mg | Freq: Once | INTRAMUSCULAR | Status: AC
  Administered 2015-05-10: 40 mg via INTRAVENOUS
  Filled 2015-05-10: qty 4

## 2015-05-10 MED ORDER — FENTANYL CITRATE (PF) 100 MCG/2ML IJ SOLN
25.0000 ug | INTRAMUSCULAR | Status: DC | PRN
  Administered 2015-05-10: 25 ug via INTRAVENOUS
  Filled 2015-05-10 (×2): qty 2

## 2015-05-10 NOTE — Progress Notes (Signed)
Subjective: She remains intubated and on the ventilator. She has elevated triglycerides so I'm going to see if she can come off the ventilator today and if she cannot point to need to switch her to a different form of sedation. She was very agitated.  Objective: Vital signs in last 24 hours: Temp:  [97.2 F (36.2 C)-99.4 F (37.4 C)] 98.2 F (36.8 C) (12/23 0400) Pulse Rate:  [59-139] 72 (12/23 0430) Resp:  [13-28] 16 (12/23 0430) BP: (107-182)/(51-85) 124/69 mmHg (12/23 0430) SpO2:  [80 %-96 %] 94 % (12/23 0430) FiO2 (%):  [40 %-55 %] 55 % (12/23 0310) Weight:  [112.2 kg (247 lb 5.7 oz)] 112.2 kg (247 lb 5.7 oz) (12/23 0500) Weight change: 0.2 kg (7.1 oz) Last BM Date: 05/07/15  Intake/Output from previous day: 12/22 0701 - 12/23 0700 In: 3845.8 [I.V.:3641; NG/GT:204.8] Out: 2850 [Urine:2850]  PHYSICAL EXAM General appearance: Intubated and sedated on mechanical ventilation Resp: rhonchi bilaterally Cardio: regular rate and rhythm, S1, S2 normal, no murmur, click, rub or gallop GI: soft, non-tender; bowel sounds normal; no masses,  no organomegaly Extremities: Trace edema  Lab Results:  Results for orders placed or performed during the hospital encounter of 05/06/15 (from the past 48 hour(s))  Glucose, capillary     Status: Abnormal   Collection Time: 05/08/15  7:55 AM  Result Value Ref Range   Glucose-Capillary 128 (H) 65 - 99 mg/dL   Comment 1 Notify RN    Comment 2 Document in Chart   Glucose, capillary     Status: Abnormal   Collection Time: 05/08/15 12:13 PM  Result Value Ref Range   Glucose-Capillary 139 (H) 65 - 99 mg/dL   Comment 1 Notify RN    Comment 2 Document in Chart   Glucose, capillary     Status: Abnormal   Collection Time: 05/08/15  4:45 PM  Result Value Ref Range   Glucose-Capillary 127 (H) 65 - 99 mg/dL   Comment 1 Notify RN    Comment 2 Document in Chart   Glucose, capillary     Status: Abnormal   Collection Time: 05/08/15  8:00 PM  Result  Value Ref Range   Glucose-Capillary 154 (H) 65 - 99 mg/dL  Glucose, capillary     Status: Abnormal   Collection Time: 05/09/15 12:32 AM  Result Value Ref Range   Glucose-Capillary 142 (H) 65 - 99 mg/dL  Triglycerides     Status: Abnormal   Collection Time: 05/09/15  4:35 AM  Result Value Ref Range   Triglycerides 993 (H) <150 mg/dL  Blood gas, arterial     Status: Abnormal   Collection Time: 05/09/15  4:46 AM  Result Value Ref Range   FIO2 0.40    Delivery systems VENTILATOR    Mode PRESSURE REGULATED VOLUME CONTROL    VT 540 mL   LHR 14.0 resp/min   Peep/cpap 5.0 cm H20   pH, Arterial 7.428 7.350 - 7.450   pCO2 arterial 38.6 35.0 - 45.0 mmHg   pO2, Arterial 112 (H) 80.0 - 100.0 mmHg   Bicarbonate 25.5 (H) 20.0 - 24.0 mEq/L   Acid-Base Excess 1.2 0.0 - 2.0 mmol/L   O2 Saturation 97.8 %   Collection site RIGHT RADIAL    Drawn by [id number]    Sample type ARTERIAL DRAW    Allens test (pass/fail) PASS PASS  Glucose, capillary     Status: Abnormal   Collection Time: 05/09/15  4:48 AM  Result Value Ref Range   Glucose-Capillary  136 (H) 65 - 99 mg/dL  Glucose, capillary     Status: Abnormal   Collection Time: 05/09/15  7:46 AM  Result Value Ref Range   Glucose-Capillary 130 (H) 65 - 99 mg/dL   Comment 1 Notify RN    Comment 2 Document in Chart   Glucose, capillary     Status: Abnormal   Collection Time: 05/09/15 11:12 AM  Result Value Ref Range   Glucose-Capillary 134 (H) 65 - 99 mg/dL  Glucose, capillary     Status: Abnormal   Collection Time: 05/09/15  8:44 PM  Result Value Ref Range   Glucose-Capillary 155 (H) 65 - 99 mg/dL  Glucose, capillary     Status: Abnormal   Collection Time: 05/09/15 11:55 PM  Result Value Ref Range   Glucose-Capillary 165 (H) 65 - 99 mg/dL  Blood gas, arterial     Status: Abnormal   Collection Time: 05/10/15  3:36 AM  Result Value Ref Range   FIO2 0.55    Delivery systems VENTILATOR    Mode PRESSURE REGULATED VOLUME CONTROL    VT 540 mL    LHR 14.0 resp/min   Peep/cpap 5.0 cm H20   pH, Arterial 7.432 7.350 - 7.450   pCO2 arterial 43.1 35.0 - 45.0 mmHg   pO2, Arterial 150 (H) 80.0 - 100.0 mmHg   Bicarbonate 27.9 (H) 20.0 - 24.0 mEq/L   TCO2 17.8 0 - 100 mmol/L   Acid-Base Excess 4.1 (H) 0.0 - 2.0 mmol/L   O2 Saturation 98.9 %   Collection site RIGHT RADIAL    Drawn by [id number]    Sample type ARTERIAL DRAW    Allens test (pass/fail) PASS PASS  Basic metabolic panel     Status: Abnormal   Collection Time: 05/10/15  4:11 AM  Result Value Ref Range   Sodium 137 135 - 145 mmol/L   Potassium 4.1 3.5 - 5.1 mmol/L   Chloride 98 (L) 101 - 111 mmol/L   CO2 28 22 - 32 mmol/L   Glucose, Bld 135 (H) 65 - 99 mg/dL   BUN 22 (H) 6 - 20 mg/dL   Creatinine, Ser 0.68 0.44 - 1.00 mg/dL   Calcium 8.6 (L) 8.9 - 10.3 mg/dL   GFR calc non Af Amer >60 >60 mL/min   GFR calc Af Amer >60 >60 mL/min    Comment: (NOTE) The eGFR has been calculated using the CKD EPI equation. This calculation has not been validated in all clinical situations. eGFR's persistently <60 mL/min signify possible Chronic Kidney Disease.    Anion gap 11 5 - 15  Glucose, capillary     Status: Abnormal   Collection Time: 05/10/15  4:31 AM  Result Value Ref Range   Glucose-Capillary 143 (H) 65 - 99 mg/dL  CBC with Differential/Platelet     Status: Abnormal   Collection Time: 05/10/15  6:52 AM  Result Value Ref Range   WBC 13.0 (H) 4.0 - 10.5 K/uL   RBC 4.00 3.87 - 5.11 MIL/uL   Hemoglobin 12.1 12.0 - 15.0 g/dL   HCT 37.0 36.0 - 46.0 %   MCV 92.5 78.0 - 100.0 fL   MCH 30.3 26.0 - 34.0 pg   MCHC 32.7 30.0 - 36.0 g/dL   RDW 13.4 11.5 - 15.5 %   Platelets 285 150 - 400 K/uL   Neutrophils Relative % 82 %   Neutro Abs 10.7 (H) 1.7 - 7.7 K/uL   Lymphocytes Relative 12 %   Lymphs Abs 1.5 0.7 -  4.0 K/uL   Monocytes Relative 6 %   Monocytes Absolute 0.8 0.1 - 1.0 K/uL   Eosinophils Relative 0 %   Eosinophils Absolute 0.0 0.0 - 0.7 K/uL   Basophils Relative 0 %    Basophils Absolute 0.0 0.0 - 0.1 K/uL    ABGS  Recent Labs  05/10/15 0336  PHART 7.432  PO2ART 150*  TCO2 17.8  HCO3 27.9*   CULTURES Recent Results (from the past 240 hour(s))  MRSA PCR Screening     Status: Abnormal   Collection Time: 05/06/15 10:15 AM  Result Value Ref Range Status   MRSA by PCR POSITIVE (A) NEGATIVE Final    Comment:        The GeneXpert MRSA Assay (FDA approved for NASAL specimens only), is one component of a comprehensive MRSA colonization surveillance program. It is not intended to diagnose MRSA infection nor to guide or monitor treatment for MRSA infections. RESULT CALLED TO, READ BACK BY AND VERIFIED WITH: SPANGLER,E. AT 1828 ON 05/06/2015 BY BAUGHAM,M.    Studies/Results: Dg Chest Port 1 View  05/09/2015  CLINICAL DATA:  Intubated.  Respiratory failure EXAM: PORTABLE CHEST 1 VIEW COMPARISON:  Chest radiograph from one day prior. FINDINGS: Endotracheal tube tip is 3.1 cm above the carina. Enteric tube enters the stomach with the tip not seen on this image. Stable cardiomediastinal silhouette with mild cardiomegaly. No pneumothorax. No pleural effusion. Stable mild to moderate pulmonary edema. IMPRESSION: Stable mild-to-moderate congestive heart failure. Well-positioned endotracheal and enteric tubes. Electronically Signed   By: Ilona Sorrel M.D.   On: 05/09/2015 07:46    Medications:  Prior to Admission:  Prescriptions prior to admission  Medication Sig Dispense Refill Last Dose  . albuterol (PROVENTIL HFA;VENTOLIN HFA) 108 (90 BASE) MCG/ACT inhaler Inhale 2 puffs into the lungs every 6 (six) hours as needed. wheezing 1 Inhaler 11 05/06/2015 at Unknown time  . albuterol (PROVENTIL) (5 MG/ML) 0.5% nebulizer solution Take 2.5 mg by nebulization every 6 (six) hours as needed for wheezing or shortness of breath.   unknown  . ALPRAZolam (XANAX) 1 MG tablet Take 1 mg by mouth 2 (two) times daily.   05/05/2015 at Unknown time  . Amantadine HCl 100 MG  tablet Take 100 mg by mouth 2 (two) times daily.  0 05/05/2015 at Unknown time  . HYDROcodone-acetaminophen (NORCO) 10-325 MG tablet Take 1 tablet by mouth every 6 (six) hours as needed for moderate pain or severe pain.   0 05/06/2015 at 130a  . levofloxacin (LEVAQUIN) 500 MG tablet Take 500 mg by mouth daily. 10 day course starting on 05/01/2015  0 05/05/2015 at Unknown time  . lidocaine (XYLOCAINE) 5 % ointment Apply 1 application topically as needed. (Patient taking differently: Apply 1 application topically as needed for mild pain or moderate pain. ) 35.44 g 0 Past Week at Unknown time  . pantoprazole (PROTONIX) 40 MG tablet Take 40 mg by mouth daily.   05/05/2015 at 2200  . polyethylene glycol (MIRALAX / GLYCOLAX) packet Take 17 g by mouth daily as needed for mild constipation.   unknown  . predniSONE (DELTASONE) 10 MG tablet Take 30 mg by mouth daily. To decrease to '20mg'$  daily for 3 days on 05/07/2015, etc , as directed per tapered instructions  0 05/06/2015 at Unknown time  . promethazine (PHENERGAN) 12.5 MG tablet Take 1 tablet by mouth every 4 (four) hours as needed for nausea or vomiting.   1 05/05/2015 at Unknown time   Scheduled: . antiseptic  oral rinse  7 mL Mouth Rinse QID  . azithromycin  500 mg Intravenous Q24H  . cefTRIAXone (ROCEPHIN)  IV  1 g Intravenous Q24H  . chlorhexidine gluconate  15 mL Mouth Rinse BID  . Chlorhexidine Gluconate Cloth  6 each Topical Q0600  . enoxaparin (LOVENOX) injection  40 mg Subcutaneous Q24H  . feeding supplement (VITAL HIGH PROTEIN)  1,000 mL Per Tube Q24H  . ipratropium-albuterol  3 mL Nebulization Q6H  . levothyroxine  50 mcg Oral QAC breakfast  . methylPREDNISolone (SOLU-MEDROL) injection  80 mg Intravenous Q6H  . mupirocin ointment  1 application Nasal BID  . pantoprazole (PROTONIX) IV  40 mg Intravenous Q24H   Continuous: . sodium chloride 75 mL/hr at 05/10/15 0652  . albuterol 10 mg/hr (05/07/15 1442)  . dexmedetomidine      PRN:[DISCONTINUED] acetaminophen **OR** acetaminophen, albuterol, fentaNYL (SUBLIMAZE) injection, midazolam, naLOXone (NARCAN)  injection, ondansetron **OR** ondansetron (ZOFRAN) IV, polyethylene glycol  Assesment: She was admitted with community-acquired pneumonia asthma with exacerbation respiratory failure now requiring ventilator support and she has obstructive sleep apnea noncompliant with CPAP. She has been very agitated. Active Problems:   CAP (community acquired pneumonia)   Asthma with acute exacerbation   Respiratory failure (HCC)   OSA (obstructive sleep apnea)   Bradycardia    Plan: Attempt weaning today. She was on 55 percent oxygen but her PO2 is excellent so I have cut that to 45. If she can be off the ventilator then we will have to switch her to another form of sedation but I'm concerned that she is probably not going to come off today.    LOS: 4 days   Marqui Formby L 05/10/2015, 7:46 AM

## 2015-05-10 NOTE — Progress Notes (Signed)
At 1130hr RT performed weaning parameters on patient, patient performed 1.2L on FVC and -20 NIF. An ABG was done and results called to physician. RT received orders to extubate once patient was more alert. At 1400 hr RT extubated patient to 40% venturi mask and gave patient a breathing treatment with duoneb. BBS diminished HR 70, SATs 90%and RR 22. RT will continue to monitor, RN at bedside.

## 2015-05-10 NOTE — Progress Notes (Signed)
Nutrition Follow-up  DOCUMENTATION CODES:   Obesity unspecified  INTERVENTION:  Diet advanced when medically cleared: recommend-CHO modified   NUTRITION DIAGNOSIS:   Inadequate oral intake related to inability to eat as evidenced by NPO status.  GOAL:   Provide needs based on ASPEN/SCCM guidelines  MONITOR:  Diet advancement, Po intake, labs and wt trends      ASSESSMENT: Pt was successfully extubated this afternoon. She is still NPO at this time. Re-estimated her nutrition needs. Will continue to follow.  Labs: glucose 144, WBC-13.0   Diet Order:  Diet NPO time specified  Skin:   intact  Last BM:   12/20   Height:   Ht Readings from Last 1 Encounters:  05/09/15 5\' 4"  (1.626 m)    Weight:   Wt Readings from Last 1 Encounters:  05/10/15 247 lb 5.7 oz (112.2 kg)    Ideal Body Weight:  54.5 kg  BMI:  Body mass index is 42.44 kg/(m^2).  Estimated Nutritional Needs:   Kcal: [id number]  Protein:  109 gr  Fluid:  1.7 liters fluid daily  EDUCATION NEEDS:   No education needs identified at this time  Colman Cater MS,RD,CSG,LDN Office: [phone] Pager: [phone]

## 2015-05-11 ENCOUNTER — Inpatient Hospital Stay (HOSPITAL_COMMUNITY): Payer: 59

## 2015-05-11 LAB — COMPREHENSIVE METABOLIC PANEL
ALT: 29 U/L (ref 14–54)
AST: 34 U/L (ref 15–41)
Albumin: 3.1 g/dL — ABNORMAL LOW (ref 3.5–5.0)
Alkaline Phosphatase: 42 U/L (ref 38–126)
Anion gap: 9 (ref 5–15)
BILIRUBIN TOTAL: 0.4 mg/dL (ref 0.3–1.2)
BUN: 28 mg/dL — AB (ref 6–20)
CO2: 31 mmol/L (ref 22–32)
CREATININE: 0.64 mg/dL (ref 0.44–1.00)
Calcium: 8.5 mg/dL — ABNORMAL LOW (ref 8.9–10.3)
Chloride: 99 mmol/L — ABNORMAL LOW (ref 101–111)
GFR calc Af Amer: >60 mL/min (ref >60)
GLUCOSE: 144 mg/dL — AB (ref 65–99)
Potassium: 3.7 mmol/L (ref 3.5–5.1)
Sodium: 139 mmol/L (ref 135–145)
TOTAL PROTEIN: 6.3 g/dL — AB (ref 6.5–8.1)

## 2015-05-11 LAB — GLUCOSE, CAPILLARY
GLUCOSE-CAPILLARY: 129 mg/dL — AB (ref 65–99)
GLUCOSE-CAPILLARY: 133 mg/dL — AB (ref 65–99)
Glucose-Capillary: 122 mg/dL — ABNORMAL HIGH (ref 65–99)
Glucose-Capillary: 142 mg/dL — ABNORMAL HIGH (ref 65–99)

## 2015-05-11 LAB — CBC WITH DIFFERENTIAL/PLATELET
BASOS ABS: 0 10*3/uL (ref 0.0–0.1)
Basophils Relative: 0 %
Eosinophils Absolute: 0 10*3/uL (ref 0.0–0.7)
Eosinophils Relative: 0 %
HEMATOCRIT: 40.1 % (ref 36.0–46.0)
HEMOGLOBIN: 13.4 g/dL (ref 12.0–15.0)
Lymphocytes Relative: 10 %
Lymphs Abs: 1.3 10*3/uL (ref 0.7–4.0)
MCH: 30.4 pg (ref 26.0–34.0)
MCHC: 33.4 g/dL (ref 30.0–36.0)
MCV: 90.9 fL (ref 78.0–100.0)
Monocytes Absolute: 0.9 10*3/uL (ref 0.1–1.0)
Monocytes Relative: 7 %
Neutro Abs: 10.7 10*3/uL — ABNORMAL HIGH (ref 1.7–7.7)
Neutrophils Relative %: 83 %
Platelets: 250 10*3/uL (ref 150–400)
RBC: 4.41 MIL/uL (ref 3.87–5.11)
RDW: 13.1 % (ref 11.5–15.5)
WBC: 12.9 10*3/uL — ABNORMAL HIGH (ref 4.0–10.5)

## 2015-05-11 IMAGING — CR DG CHEST 1V PORT
1 series · 1 of 1 positions shown · non-contrast
Comparison: Chest radiograph performed [DATE]

CLINICAL DATA: Acute onset of shortness of breath. Subsequent
encounter.

EXAM:
PORTABLE CHEST 1 VIEW

[ap portable]
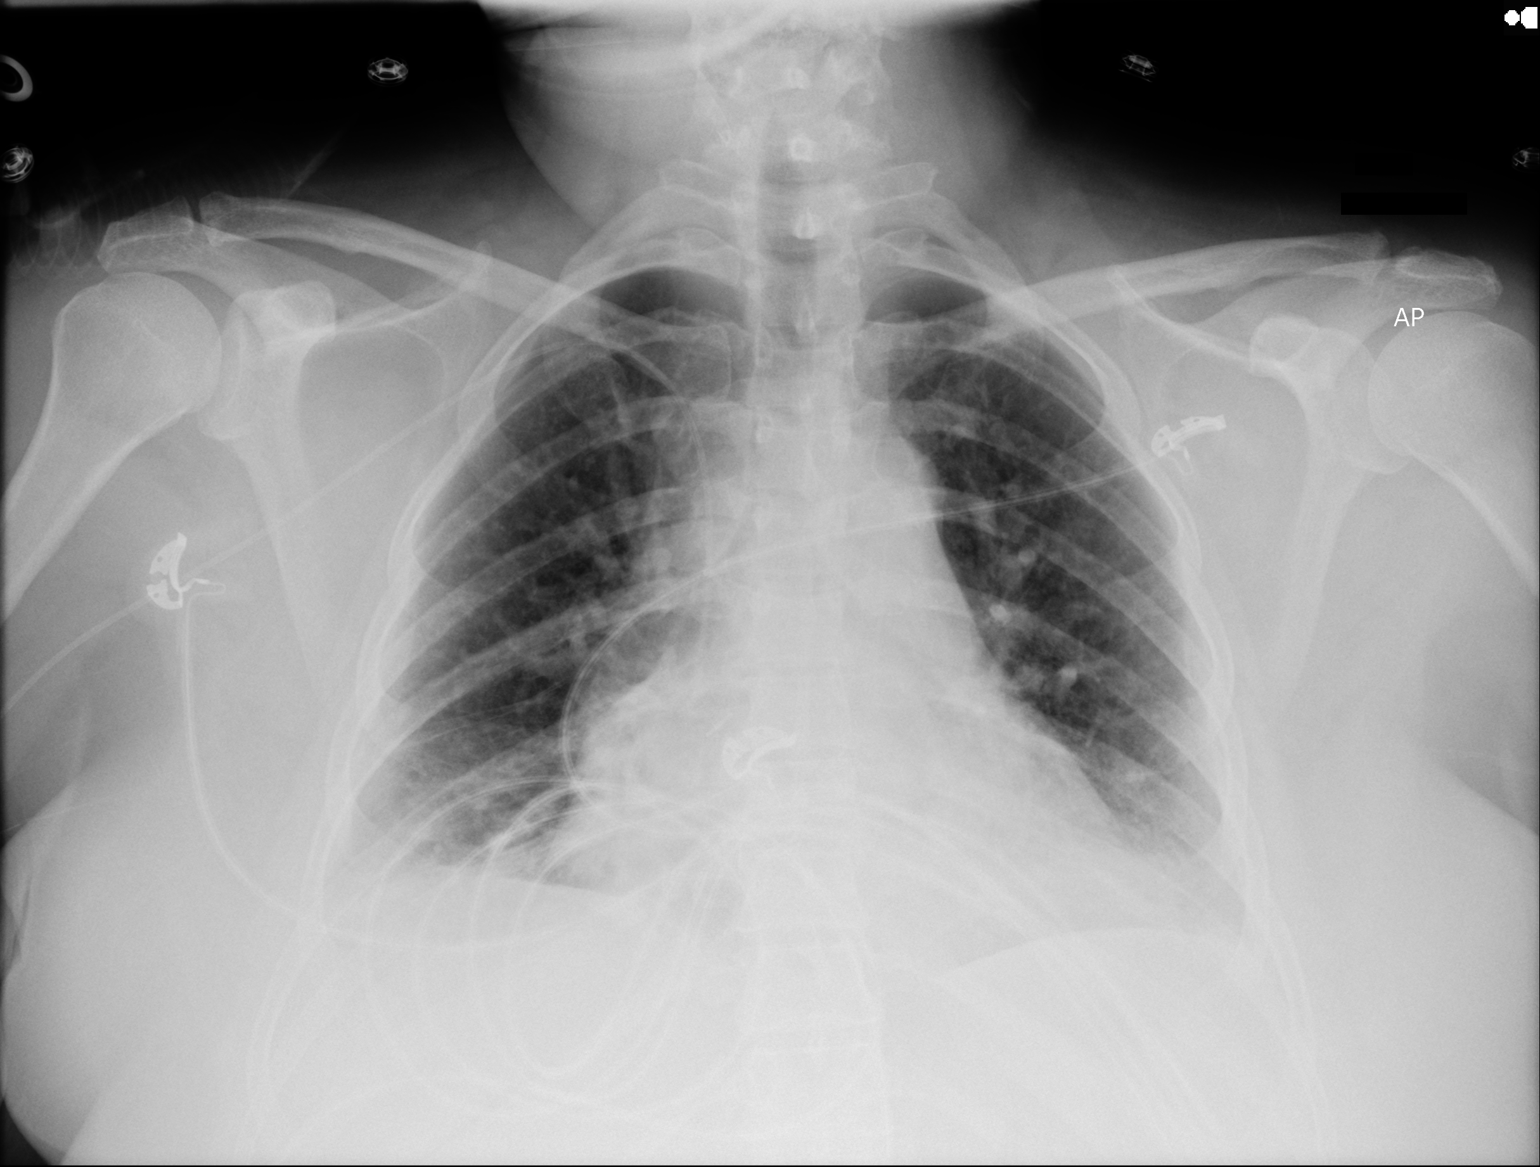

[1 of 1 positions shown; findings below may reference images not displayed]

FINDINGS: The patient's right PICC is noted ending about the distal SVC.

The lungs are mildly hypoexpanded. Mild bibasilar opacities may
reflect atelectasis or possibly mild pneumonia. No pleural effusion
or pneumothorax is seen.

The cardiomediastinal silhouette is mildly enlarged. No acute
osseous abnormalities are identified.
IMPRESSION: 1. Right PICC noted ending about the distal SVC.
2. Lungs mildly hypoexpanded. Mild bibasilar airspace opacities may
reflect atelectasis or possibly mild pneumonia.
3. Mild cardiomegaly.

## 2015-05-11 MED ORDER — ACETAMINOPHEN 325 MG PO TABS
650.0000 mg | ORAL_TABLET | Freq: Four times a day (QID) | ORAL | Status: DC | PRN
  Administered 2015-05-11: 650 mg via ORAL
  Filled 2015-05-11: qty 2

## 2015-05-11 MED ORDER — FENTANYL CITRATE (PF) 100 MCG/2ML IJ SOLN
12.5000 ug | Freq: Four times a day (QID) | INTRAMUSCULAR | Status: DC | PRN
  Administered 2015-05-12: 12.5 ug via INTRAVENOUS
  Filled 2015-05-11: qty 2

## 2015-05-11 MED ORDER — INSULIN ASPART 100 UNIT/ML ~~LOC~~ SOLN
0.0000 [IU] | Freq: Three times a day (TID) | SUBCUTANEOUS | Status: DC
  Administered 2015-05-11 (×2): 2 [IU] via SUBCUTANEOUS
  Administered 2015-05-12: 3 [IU] via SUBCUTANEOUS
  Administered 2015-05-12: 2 [IU] via SUBCUTANEOUS
  Administered 2015-05-12: 3 [IU] via SUBCUTANEOUS
  Administered 2015-05-13: 2 [IU] via SUBCUTANEOUS

## 2015-05-11 NOTE — Progress Notes (Signed)
Subjective: She was able to be successfully extubated yesterday. She still kind of sleepy. I had requested EEG when she first had the issue but apparently it wasn't done. I have reordered that. She has complaints of a headache  Objective: Vital signs in last 24 hours: Temp:  [97.2 F (36.2 C)-98.5 F (36.9 C)] 97.9 F (36.6 C) (12/24 0742) Pulse Rate:  [40-85] 60 (12/24 0900) Resp:  [14-25] 15 (12/24 0900) BP: (105-147)/(60-86) 142/86 mmHg (12/24 0900) SpO2:  [83 %-97 %] 83 % (12/24 0900) FiO2 (%):  [40 %-65 %] 55 % (12/24 0727) Weight:  [110 kg (242 lb 8.1 oz)] 110 kg (242 lb 8.1 oz) (12/24 0500) Weight change: -2.2 kg (-4 lb 13.6 oz) Last BM Date: 05/07/15  Intake/Output from previous day: 12/23 0701 - 12/24 0700 In: 2695 [P.O.:60; I.V.:1735; IV Piggyback:900] Out: 3300 [Urine:3300]  PHYSICAL EXAM General appearance: alert and mild distress Resp: rhonchi bilaterally Cardio: regular rate and rhythm, S1, S2 normal, no murmur, click, rub or gallop GI: soft, non-tender; bowel sounds normal; no masses,  no organomegaly Extremities: extremities normal, atraumatic, no cyanosis or edema  Lab Results:  Results for orders placed or performed during the hospital encounter of 05/06/15 (from the past 48 hour(s))  Glucose, capillary     Status: Abnormal   Collection Time: 05/09/15 11:12 AM  Result Value Ref Range   Glucose-Capillary 134 (H) 65 - 99 mg/dL  Glucose, capillary     Status: Abnormal   Collection Time: 05/09/15  8:44 PM  Result Value Ref Range   Glucose-Capillary 155 (H) 65 - 99 mg/dL  Glucose, capillary     Status: Abnormal   Collection Time: 05/09/15 11:55 PM  Result Value Ref Range   Glucose-Capillary 165 (H) 65 - 99 mg/dL  Blood gas, arterial     Status: Abnormal   Collection Time: 05/10/15  3:36 AM  Result Value Ref Range   FIO2 0.55    Delivery systems VENTILATOR    Mode PRESSURE REGULATED VOLUME CONTROL    VT 540 mL   LHR 14.0 resp/min   Peep/cpap 5.0 cm H20    pH, Arterial 7.432 7.350 - 7.450   pCO2 arterial 43.1 35.0 - 45.0 mmHg   pO2, Arterial 150 (H) 80.0 - 100.0 mmHg   Bicarbonate 27.9 (H) 20.0 - 24.0 mEq/L   TCO2 17.8 0 - 100 mmol/L   Acid-Base Excess 4.1 (H) 0.0 - 2.0 mmol/L   O2 Saturation 98.9 %   Collection site RIGHT RADIAL    Drawn by [id number]    Sample type ARTERIAL DRAW    Allens test (pass/fail) PASS PASS  Basic metabolic panel     Status: Abnormal   Collection Time: 05/10/15  4:11 AM  Result Value Ref Range   Sodium 137 135 - 145 mmol/L   Potassium 4.1 3.5 - 5.1 mmol/L   Chloride 98 (L) 101 - 111 mmol/L   CO2 28 22 - 32 mmol/L   Glucose, Bld 135 (H) 65 - 99 mg/dL   BUN 22 (H) 6 - 20 mg/dL   Creatinine, Ser 0.68 0.44 - 1.00 mg/dL   Calcium 8.6 (L) 8.9 - 10.3 mg/dL   GFR calc non Af Amer >60 >60 mL/min   GFR calc Af Amer >60 >60 mL/min    Comment: (NOTE) The eGFR has been calculated using the CKD EPI equation. This calculation has not been validated in all clinical situations. eGFR's persistently <60 mL/min signify possible Chronic Kidney Disease.    Anion  gap 11 5 - 15  Glucose, capillary     Status: Abnormal   Collection Time: 05/10/15  4:31 AM  Result Value Ref Range   Glucose-Capillary 143 (H) 65 - 99 mg/dL  CBC with Differential/Platelet     Status: Abnormal   Collection Time: 05/10/15  6:52 AM  Result Value Ref Range   WBC 13.0 (H) 4.0 - 10.5 K/uL   RBC 4.00 3.87 - 5.11 MIL/uL   Hemoglobin 12.1 12.0 - 15.0 g/dL   HCT 37.0 36.0 - 46.0 %   MCV 92.5 78.0 - 100.0 fL   MCH 30.3 26.0 - 34.0 pg   MCHC 32.7 30.0 - 36.0 g/dL   RDW 13.4 11.5 - 15.5 %   Platelets 285 150 - 400 K/uL   Neutrophils Relative % 82 %   Neutro Abs 10.7 (H) 1.7 - 7.7 K/uL   Lymphocytes Relative 12 %   Lymphs Abs 1.5 0.7 - 4.0 K/uL   Monocytes Relative 6 %   Monocytes Absolute 0.8 0.1 - 1.0 K/uL   Eosinophils Relative 0 %   Eosinophils Absolute 0.0 0.0 - 0.7 K/uL   Basophils Relative 0 %   Basophils Absolute 0.0 0.0 - 0.1 K/uL   Glucose, capillary     Status: Abnormal   Collection Time: 05/10/15  7:39 AM  Result Value Ref Range   Glucose-Capillary 149 (H) 65 - 99 mg/dL  Blood gas, arterial     Status: Abnormal   Collection Time: 05/10/15  9:20 AM  Result Value Ref Range   FIO2 40.00    Delivery systems VENTILATOR    Mode CONTINUOUS POSITIVE AIRWAY PRESSURE    Peep/cpap 5.0 cm H20   Pressure support 5.0 cm H20   pH, Arterial 7.419 7.350 - 7.450   pCO2 arterial 45.2 (H) 35.0 - 45.0 mmHg   pO2, Arterial 89.7 80.0 - 100.0 mmHg   Bicarbonate 28.0 (H) 20.0 - 24.0 mEq/L   Acid-Base Excess 4.5 (H) 0.0 - 2.0 mmol/L   O2 Saturation 96.2 %   Collection site LEFT RADIAL    Drawn by [id number]    Sample type ARTERIAL    Allens test (pass/fail) PASS PASS  Glucose, capillary     Status: Abnormal   Collection Time: 05/10/15 11:58 AM  Result Value Ref Range   Glucose-Capillary 144 (H) 65 - 99 mg/dL  Glucose, capillary     Status: Abnormal   Collection Time: 05/10/15  4:34 PM  Result Value Ref Range   Glucose-Capillary 137 (H) 65 - 99 mg/dL  Glucose, capillary     Status: Abnormal   Collection Time: 05/10/15  8:11 PM  Result Value Ref Range   Glucose-Capillary 160 (H) 65 - 99 mg/dL  Glucose, capillary     Status: Abnormal   Collection Time: 05/10/15 11:10 PM  Result Value Ref Range   Glucose-Capillary 155 (H) 65 - 99 mg/dL  CBC with Differential/Platelet     Status: Abnormal   Collection Time: 05/11/15  5:10 AM  Result Value Ref Range   WBC 12.9 (H) 4.0 - 10.5 K/uL   RBC 4.41 3.87 - 5.11 MIL/uL   Hemoglobin 13.4 12.0 - 15.0 g/dL   HCT 40.1 36.0 - 46.0 %   MCV 90.9 78.0 - 100.0 fL   MCH 30.4 26.0 - 34.0 pg   MCHC 33.4 30.0 - 36.0 g/dL   RDW 13.1 11.5 - 15.5 %   Platelets 250 150 - 400 K/uL   Neutrophils Relative % 83 %  Neutro Abs 10.7 (H) 1.7 - 7.7 K/uL   Lymphocytes Relative 10 %   Lymphs Abs 1.3 0.7 - 4.0 K/uL   Monocytes Relative 7 %   Monocytes Absolute 0.9 0.1 - 1.0 K/uL   Eosinophils Relative 0 %    Eosinophils Absolute 0.0 0.0 - 0.7 K/uL   Basophils Relative 0 %   Basophils Absolute 0.0 0.0 - 0.1 K/uL  Comprehensive metabolic panel     Status: Abnormal   Collection Time: 05/11/15  5:10 AM  Result Value Ref Range   Sodium 139 135 - 145 mmol/L   Potassium 3.7 3.5 - 5.1 mmol/L   Chloride 99 (L) 101 - 111 mmol/L   CO2 31 22 - 32 mmol/L   Glucose, Bld 144 (H) 65 - 99 mg/dL   BUN 28 (H) 6 - 20 mg/dL   Creatinine, Ser 0.64 0.44 - 1.00 mg/dL   Calcium 8.5 (L) 8.9 - 10.3 mg/dL   Total Protein 6.3 (L) 6.5 - 8.1 g/dL   Albumin 3.1 (L) 3.5 - 5.0 g/dL   AST 34 15 - 41 U/L   ALT 29 14 - 54 U/L   Alkaline Phosphatase 42 38 - 126 U/L   Total Bilirubin 0.4 0.3 - 1.2 mg/dL   GFR calc non Af Amer >60 >60 mL/min   GFR calc Af Amer >60 >60 mL/min    Comment: (NOTE) The eGFR has been calculated using the CKD EPI equation. This calculation has not been validated in all clinical situations. eGFR's persistently <60 mL/min signify possible Chronic Kidney Disease.    Anion gap 9 5 - 15  Glucose, capillary     Status: Abnormal   Collection Time: 05/11/15  7:21 AM  Result Value Ref Range   Glucose-Capillary 129 (H) 65 - 99 mg/dL   Comment 1 Notify RN    Comment 2 Document in Chart     ABGS  Recent Labs  05/10/15 0336 05/10/15 0920  PHART 7.432 7.419  PO2ART 150* 89.7  TCO2 17.8  --   HCO3 27.9* 28.0*   CULTURES Recent Results (from the past 240 hour(s))  MRSA PCR Screening     Status: Abnormal   Collection Time: 05/06/15 10:15 AM  Result Value Ref Range Status   MRSA by PCR POSITIVE (A) NEGATIVE Final    Comment:        The GeneXpert MRSA Assay (FDA approved for NASAL specimens only), is one component of a comprehensive MRSA colonization surveillance program. It is not intended to diagnose MRSA infection nor to guide or monitor treatment for MRSA infections. RESULT CALLED TO, READ BACK BY AND VERIFIED WITH: SPANGLER,E. AT 1828 ON 05/06/2015 BY BAUGHAM,M.     Studies/Results: Dg Chest Port 1 View  05/11/2015  CLINICAL DATA:  Acute onset of shortness of breath. Subsequent encounter. EXAM: PORTABLE CHEST 1 VIEW COMPARISON:  Chest radiograph performed 05/10/2015 FINDINGS: The patient's right PICC is noted ending about the distal SVC. The lungs are mildly hypoexpanded. Mild bibasilar opacities may reflect atelectasis or possibly mild pneumonia. No pleural effusion or pneumothorax is seen. The cardiomediastinal silhouette is mildly enlarged. No acute osseous abnormalities are identified. IMPRESSION: 1. Right PICC noted ending about the distal SVC. 2. Lungs mildly hypoexpanded. Mild bibasilar airspace opacities may reflect atelectasis or possibly mild pneumonia. 3. Mild cardiomegaly. Electronically Signed   By: Garald Balding M.D.   On: 05/11/2015 01:57   Dg Chest Port 1 View  05/10/2015  CLINICAL DATA:  On ventilator, respiratory failure EXAM:  PORTABLE CHEST 1 VIEW COMPARISON:  Portable exam 0608 hours compared to 05/09/2015 FINDINGS: Tip of endotracheal tube projects 3.8 cm above carina. Nasogastric tube extends into stomach. EKG leads project over chest. Enlargement of cardiac silhouette with pulmonary vascular congestion. Mild perihilar infiltrates question pulmonary edema. More significant opacity identified in LEFT lower lobe question atelectasis versus infiltrate. No pneumothorax. Small LEFT pleural effusion questioned. No acute osseous findings. IMPRESSION: Probable mild pulmonary edema. Question coexistent atelectasis versus consolidation LEFT lower lobe with suspected small LEFT pleural effusion. Electronically Signed   By: Lavonia Dana M.D.   On: 05/10/2015 08:12    Medications:  Prior to Admission:  Prescriptions prior to admission  Medication Sig Dispense Refill Last Dose  . albuterol (PROVENTIL HFA;VENTOLIN HFA) 108 (90 BASE) MCG/ACT inhaler Inhale 2 puffs into the lungs every 6 (six) hours as needed. wheezing 1 Inhaler 11 05/06/2015 at Unknown  time  . albuterol (PROVENTIL) (5 MG/ML) 0.5% nebulizer solution Take 2.5 mg by nebulization every 6 (six) hours as needed for wheezing or shortness of breath.   unknown  . ALPRAZolam (XANAX) 1 MG tablet Take 1 mg by mouth 2 (two) times daily.   05/05/2015 at Unknown time  . Amantadine HCl 100 MG tablet Take 100 mg by mouth 2 (two) times daily.  0 05/05/2015 at Unknown time  . HYDROcodone-acetaminophen (NORCO) 10-325 MG tablet Take 1 tablet by mouth every 6 (six) hours as needed for moderate pain or severe pain.   0 05/06/2015 at 130a  . levofloxacin (LEVAQUIN) 500 MG tablet Take 500 mg by mouth daily. 10 day course starting on 05/01/2015  0 05/05/2015 at Unknown time  . lidocaine (XYLOCAINE) 5 % ointment Apply 1 application topically as needed. (Patient taking differently: Apply 1 application topically as needed for mild pain or moderate pain. ) 35.44 g 0 Past Week at Unknown time  . pantoprazole (PROTONIX) 40 MG tablet Take 40 mg by mouth daily.   05/05/2015 at 2200  . polyethylene glycol (MIRALAX / GLYCOLAX) packet Take 17 g by mouth daily as needed for mild constipation.   unknown  . predniSONE (DELTASONE) 10 MG tablet Take 30 mg by mouth daily. To decrease to 73m daily for 3 days on 05/07/2015, etc , as directed per tapered instructions  0 05/06/2015 at Unknown time  . promethazine (PHENERGAN) 12.5 MG tablet Take 1 tablet by mouth every 4 (four) hours as needed for nausea or vomiting.   1 05/05/2015 at Unknown time   Scheduled: . azithromycin  500 mg Intravenous Q24H  . cefTRIAXone (ROCEPHIN)  IV  1 g Intravenous Q24H  . Chlorhexidine Gluconate Cloth  6 each Topical Q0600  . enoxaparin (LOVENOX) injection  40 mg Subcutaneous Q24H  . insulin aspart  0-15 Units Subcutaneous TID WC  . ipratropium-albuterol  3 mL Nebulization Q6H  . levothyroxine  50 mcg Oral QAC breakfast  . methylPREDNISolone (SOLU-MEDROL) injection  80 mg Intravenous Q6H  . mupirocin ointment  1 application Nasal BID  .  pantoprazole (PROTONIX) IV  40 mg Intravenous Q24H   Continuous: . sodium chloride 75 mL/hr at 05/10/15 2017  . albuterol 10 mg/hr (05/07/15 1442)   PRN:[DISCONTINUED] acetaminophen **OR** acetaminophen, acetaminophen, albuterol, fentaNYL (SUBLIMAZE) injection, naLOXone (NARCAN)  injection, ondansetron **OR** ondansetron (ZOFRAN) IV, polyethylene glycol  Assesment: She was admitted with community-acquired pneumonia respiratory failure and asthma/COPD with exacerbation. She became extremely somnolent and required intubation and mechanical ventilation. It's not really clear what happened with that. She is still sleepy. She  is off the ventilator. Active Problems:   CAP (community acquired pneumonia)   Asthma with acute exacerbation   Respiratory failure (HCC)   OSA (obstructive sleep apnea)   Bradycardia    Plan: Continue current treatments. EEG. Minimize any sedating medications. Tylenol for headache. Accu-Cheks and sliding scale insulin. Clear liquid diet which can be advanced    LOS: 5 days   HAWKINS,EDWARD L 05/11/2015, 9:33 AM

## 2015-05-12 LAB — CBC WITH DIFFERENTIAL/PLATELET
Basophils Absolute: 0 10*3/uL (ref 0.0–0.1)
Basophils Relative: 0 %
EOS ABS: 0 10*3/uL (ref 0.0–0.7)
EOS PCT: 0 %
HCT: 38.7 % (ref 36.0–46.0)
Hemoglobin: 12.8 g/dL (ref 12.0–15.0)
LYMPHS ABS: 1.1 10*3/uL (ref 0.7–4.0)
LYMPHS PCT: 8 %
MCH: 29.8 pg (ref 26.0–34.0)
MCHC: 33.1 g/dL (ref 30.0–36.0)
MCV: 90.2 fL (ref 78.0–100.0)
MONO ABS: 0.8 10*3/uL (ref 0.1–1.0)
Monocytes Relative: 6 %
Neutro Abs: 12.1 10*3/uL — ABNORMAL HIGH (ref 1.7–7.7)
Neutrophils Relative %: 86 %
PLATELETS: 232 10*3/uL (ref 150–400)
RBC: 4.29 MIL/uL (ref 3.87–5.11)
RDW: 13 % (ref 11.5–15.5)
WBC: 14 10*3/uL — ABNORMAL HIGH (ref 4.0–10.5)

## 2015-05-12 LAB — GLUCOSE, CAPILLARY
Glucose-Capillary: 152 mg/dL — ABNORMAL HIGH (ref 65–99)
Glucose-Capillary: 140 mg/dL — ABNORMAL HIGH (ref 65–99)
Glucose-Capillary: 177 mg/dL — ABNORMAL HIGH (ref 65–99)
Glucose-Capillary: 139 mg/dL — ABNORMAL HIGH (ref 65–99)

## 2015-05-12 LAB — BASIC METABOLIC PANEL
Anion gap: 8 (ref 5–15)
BUN: 22 mg/dL — AB (ref 6–20)
CHLORIDE: 99 mmol/L — AB (ref 101–111)
CO2: 31 mmol/L (ref 22–32)
CREATININE: 0.59 mg/dL (ref 0.44–1.00)
Calcium: 8.3 mg/dL — ABNORMAL LOW (ref 8.9–10.3)
GFR calc Af Amer: >60 mL/min (ref >60)
GFR calc non Af Amer: >60 mL/min (ref >60)
GLUCOSE: 154 mg/dL — AB (ref 65–99)
POTASSIUM: 3.7 mmol/L (ref 3.5–5.1)
Sodium: 138 mmol/L (ref 135–145)

## 2015-05-12 MED ORDER — GUAIFENESIN ER 600 MG PO TB12
1200.0000 mg | ORAL_TABLET | Freq: Two times a day (BID) | ORAL | Status: DC
  Administered 2015-05-12 – 2015-05-13 (×3): 1200 mg via ORAL
  Filled 2015-05-12 (×3): qty 2

## 2015-05-12 MED ORDER — PREDNISONE 20 MG PO TABS
40.0000 mg | ORAL_TABLET | Freq: Every day | ORAL | Status: DC
  Administered 2015-05-12 – 2015-05-13 (×2): 40 mg via ORAL
  Filled 2015-05-12 (×2): qty 2

## 2015-05-12 MED ORDER — LEVOFLOXACIN 750 MG PO TABS: 750.0000 mg | ORAL_TABLET | Freq: Every day | ORAL | Status: DC

## 2015-05-12 MED ORDER — LEVOFLOXACIN 750 MG PO TABS
750.0000 mg | ORAL_TABLET | Freq: Every day | ORAL | Status: DC
  Administered 2015-05-12 – 2015-05-13 (×2): 750 mg via ORAL
  Filled 2015-05-12 (×2): qty 1

## 2015-05-12 MED ORDER — IPRATROPIUM-ALBUTEROL 0.5-2.5 (3) MG/3ML IN SOLN
3.0000 mL | Freq: Four times a day (QID) | RESPIRATORY_TRACT | Status: DC
  Administered 2015-05-13: 3 mL via RESPIRATORY_TRACT
  Filled 2015-05-12: qty 3

## 2015-05-12 NOTE — Progress Notes (Signed)
Pt a/[id number].vss.Iv patent. Denies any pain. Report given to Apolonio Schneiders, Therapist, sports. Caregiver verbalized understanding of report. Pt to be transferred to room 329.

## 2015-05-12 NOTE — Progress Notes (Signed)
Subjective: She is much improved. She is back to baseline as far as her mental status is concerned. She has no new complaints. She is still coughing mostly nonproductively  Objective: Vital signs in last 24 hours: Temp:  [97 F (36.1 C)-97.7 F (36.5 C)] 97.7 F (36.5 C) (12/25 0700) Pulse Rate:  [38-71] 56 (12/25 0900) Resp:  [10-18] 17 (12/25 0900) BP: (100-149)/(61-90) 110/61 mmHg (12/25 0900) SpO2:  [91 %-100 %] 95 % (12/25 0900) FiO2 (%):  [65 %] 65 % (12/25 0305) Weight:  [112.5 kg (248 lb 0.3 oz)] 112.5 kg (248 lb 0.3 oz) (12/25 0400) Weight change: 2.5 kg (5 lb 8.2 oz) Last BM Date: 05/07/15  Intake/Output from previous day: 12/24 0701 - 12/25 0700 In: 2340 [P.O.:240; I.V.:1800; IV Piggyback:300] Out: 1175 [Urine:1175]  PHYSICAL EXAM General appearance: alert, cooperative and no distress Resp: rhonchi bilaterally Cardio: regular rate and rhythm, S1, S2 normal, no murmur, click, rub or gallop GI: soft, non-tender; bowel sounds normal; no masses,  no organomegaly Extremities: extremities normal, atraumatic, no cyanosis or edema  Lab Results:  Results for orders placed or performed during the hospital encounter of 05/06/15 (from the past 48 hour(s))  Glucose, capillary     Status: Abnormal   Collection Time: 05/10/15 11:58 AM  Result Value Ref Range   Glucose-Capillary 144 (H) 65 - 99 mg/dL  Glucose, capillary     Status: Abnormal   Collection Time: 05/10/15  4:34 PM  Result Value Ref Range   Glucose-Capillary 137 (H) 65 - 99 mg/dL  Glucose, capillary     Status: Abnormal   Collection Time: 05/10/15  8:11 PM  Result Value Ref Range   Glucose-Capillary 160 (H) 65 - 99 mg/dL  Glucose, capillary     Status: Abnormal   Collection Time: 05/10/15 11:10 PM  Result Value Ref Range   Glucose-Capillary 155 (H) 65 - 99 mg/dL  CBC with Differential/Platelet     Status: Abnormal   Collection Time: 05/11/15  5:10 AM  Result Value Ref Range   WBC 12.9 (H) 4.0 - 10.5 K/uL    RBC 4.41 3.87 - 5.11 MIL/uL   Hemoglobin 13.4 12.0 - 15.0 g/dL   HCT 40.1 36.0 - 46.0 %   MCV 90.9 78.0 - 100.0 fL   MCH 30.4 26.0 - 34.0 pg   MCHC 33.4 30.0 - 36.0 g/dL   RDW 13.1 11.5 - 15.5 %   Platelets 250 150 - 400 K/uL   Neutrophils Relative % 83 %   Neutro Abs 10.7 (H) 1.7 - 7.7 K/uL   Lymphocytes Relative 10 %   Lymphs Abs 1.3 0.7 - 4.0 K/uL   Monocytes Relative 7 %   Monocytes Absolute 0.9 0.1 - 1.0 K/uL   Eosinophils Relative 0 %   Eosinophils Absolute 0.0 0.0 - 0.7 K/uL   Basophils Relative 0 %   Basophils Absolute 0.0 0.0 - 0.1 K/uL  Comprehensive metabolic panel     Status: Abnormal   Collection Time: 05/11/15  5:10 AM  Result Value Ref Range   Sodium 139 135 - 145 mmol/L   Potassium 3.7 3.5 - 5.1 mmol/L   Chloride 99 (L) 101 - 111 mmol/L   CO2 31 22 - 32 mmol/L   Glucose, Bld 144 (H) 65 - 99 mg/dL   BUN 28 (H) 6 - 20 mg/dL   Creatinine, Ser 0.64 0.44 - 1.00 mg/dL   Calcium 8.5 (L) 8.9 - 10.3 mg/dL   Total Protein 6.3 (L) 6.5 -  8.1 g/dL   Albumin 3.1 (L) 3.5 - 5.0 g/dL   AST 34 15 - 41 U/L   ALT 29 14 - 54 U/L   Alkaline Phosphatase 42 38 - 126 U/L   Total Bilirubin 0.4 0.3 - 1.2 mg/dL   GFR calc non Af Amer >60 >60 mL/min   GFR calc Af Amer >60 >60 mL/min    Comment: (NOTE) The eGFR has been calculated using the CKD EPI equation. This calculation has not been validated in all clinical situations. eGFR's persistently <60 mL/min signify possible Chronic Kidney Disease.    Anion gap 9 5 - 15  Glucose, capillary     Status: Abnormal   Collection Time: 05/11/15  7:21 AM  Result Value Ref Range   Glucose-Capillary 129 (H) 65 - 99 mg/dL   Comment 1 Notify RN    Comment 2 Document in Chart   Glucose, capillary     Status: Abnormal   Collection Time: 05/11/15 11:27 AM  Result Value Ref Range   Glucose-Capillary 133 (H) 65 - 99 mg/dL   Comment 1 Notify RN    Comment 2 Document in Chart   Glucose, capillary     Status: Abnormal   Collection Time: 05/11/15   4:32 PM  Result Value Ref Range   Glucose-Capillary 122 (H) 65 - 99 mg/dL   Comment 1 Notify RN    Comment 2 Document in Chart   Glucose, capillary     Status: Abnormal   Collection Time: 05/11/15  9:02 PM  Result Value Ref Range   Glucose-Capillary 142 (H) 65 - 99 mg/dL   Comment 1 Notify RN   Basic metabolic panel     Status: Abnormal   Collection Time: 05/12/15  4:31 AM  Result Value Ref Range   Sodium 138 135 - 145 mmol/L   Potassium 3.7 3.5 - 5.1 mmol/L   Chloride 99 (L) 101 - 111 mmol/L   CO2 31 22 - 32 mmol/L   Glucose, Bld 154 (H) 65 - 99 mg/dL   BUN 22 (H) 6 - 20 mg/dL   Creatinine, Ser 0.59 0.44 - 1.00 mg/dL   Calcium 8.3 (L) 8.9 - 10.3 mg/dL   GFR calc non Af Amer >60 >60 mL/min   GFR calc Af Amer >60 >60 mL/min    Comment: (NOTE) The eGFR has been calculated using the CKD EPI equation. This calculation has not been validated in all clinical situations. eGFR's persistently <60 mL/min signify possible Chronic Kidney Disease.    Anion gap 8 5 - 15  CBC with Differential/Platelet     Status: Abnormal   Collection Time: 05/12/15  4:31 AM  Result Value Ref Range   WBC 14.0 (H) 4.0 - 10.5 K/uL   RBC 4.29 3.87 - 5.11 MIL/uL   Hemoglobin 12.8 12.0 - 15.0 g/dL   HCT 38.7 36.0 - 46.0 %   MCV 90.2 78.0 - 100.0 fL   MCH 29.8 26.0 - 34.0 pg   MCHC 33.1 30.0 - 36.0 g/dL   RDW 13.0 11.5 - 15.5 %   Platelets 232 150 - 400 K/uL   Neutrophils Relative % 86 %   Neutro Abs 12.1 (H) 1.7 - 7.7 K/uL   Lymphocytes Relative 8 %   Lymphs Abs 1.1 0.7 - 4.0 K/uL   Monocytes Relative 6 %   Monocytes Absolute 0.8 0.1 - 1.0 K/uL   Eosinophils Relative 0 %   Eosinophils Absolute 0.0 0.0 - 0.7 K/uL   Basophils Relative  0 %   Basophils Absolute 0.0 0.0 - 0.1 K/uL  Glucose, capillary     Status: Abnormal   Collection Time: 05/12/15  7:32 AM  Result Value Ref Range   Glucose-Capillary 152 (H) 65 - 99 mg/dL   Comment 1 Notify RN    Comment 2 Document in Chart     ABGS  Recent  Labs  05/10/15 0336 05/10/15 0920  PHART 7.432 7.419  PO2ART 150* 89.7  TCO2 17.8  --   HCO3 27.9* 28.0*   CULTURES Recent Results (from the past 240 hour(s))  MRSA PCR Screening     Status: Abnormal   Collection Time: 05/06/15 10:15 AM  Result Value Ref Range Status   MRSA by PCR POSITIVE (A) NEGATIVE Final    Comment:        The GeneXpert MRSA Assay (FDA approved for NASAL specimens only), is one component of a comprehensive MRSA colonization surveillance program. It is not intended to diagnose MRSA infection nor to guide or monitor treatment for MRSA infections. RESULT CALLED TO, READ BACK BY AND VERIFIED WITH: SPANGLER,E. AT 1828 ON 05/06/2015 BY BAUGHAM,M.    Studies/Results: Dg Chest Port 1 View  05/11/2015  CLINICAL DATA:  Acute onset of shortness of breath. Subsequent encounter. EXAM: PORTABLE CHEST 1 VIEW COMPARISON:  Chest radiograph performed 05/10/2015 FINDINGS: The patient's right PICC is noted ending about the distal SVC. The lungs are mildly hypoexpanded. Mild bibasilar opacities may reflect atelectasis or possibly mild pneumonia. No pleural effusion or pneumothorax is seen. The cardiomediastinal silhouette is mildly enlarged. No acute osseous abnormalities are identified. IMPRESSION: 1. Right PICC noted ending about the distal SVC. 2. Lungs mildly hypoexpanded. Mild bibasilar airspace opacities may reflect atelectasis or possibly mild pneumonia. 3. Mild cardiomegaly. Electronically Signed   By: Garald Balding M.D.   On: 05/11/2015 01:57    Medications:  Prior to Admission:  Prescriptions prior to admission  Medication Sig Dispense Refill Last Dose  . albuterol (PROVENTIL HFA;VENTOLIN HFA) 108 (90 BASE) MCG/ACT inhaler Inhale 2 puffs into the lungs every 6 (six) hours as needed. wheezing 1 Inhaler 11 05/06/2015 at Unknown time  . albuterol (PROVENTIL) (5 MG/ML) 0.5% nebulizer solution Take 2.5 mg by nebulization every 6 (six) hours as needed for wheezing or  shortness of breath.   unknown  . ALPRAZolam (XANAX) 1 MG tablet Take 1 mg by mouth 2 (two) times daily.   05/05/2015 at Unknown time  . Amantadine HCl 100 MG tablet Take 100 mg by mouth 2 (two) times daily.  0 05/05/2015 at Unknown time  . HYDROcodone-acetaminophen (NORCO) 10-325 MG tablet Take 1 tablet by mouth every 6 (six) hours as needed for moderate pain or severe pain.   0 05/06/2015 at 130a  . levofloxacin (LEVAQUIN) 500 MG tablet Take 500 mg by mouth daily. 10 day course starting on 05/01/2015  0 05/05/2015 at Unknown time  . lidocaine (XYLOCAINE) 5 % ointment Apply 1 application topically as needed. (Patient taking differently: Apply 1 application topically as needed for mild pain or moderate pain. ) 35.44 g 0 Past Week at Unknown time  . pantoprazole (PROTONIX) 40 MG tablet Take 40 mg by mouth daily.   05/05/2015 at 2200  . polyethylene glycol (MIRALAX / GLYCOLAX) packet Take 17 g by mouth daily as needed for mild constipation.   unknown  . predniSONE (DELTASONE) 10 MG tablet Take 30 mg by mouth daily. To decrease to 54m daily for 3 days on 05/07/2015, etc , as directed  per tapered instructions  0 05/06/2015 at Unknown time  . promethazine (PHENERGAN) 12.5 MG tablet Take 1 tablet by mouth every 4 (four) hours as needed for nausea or vomiting.   1 05/05/2015 at Unknown time   Scheduled: . azithromycin  500 mg Intravenous Q24H  . cefTRIAXone (ROCEPHIN)  IV  1 g Intravenous Q24H  . enoxaparin (LOVENOX) injection  40 mg Subcutaneous Q24H  . insulin aspart  0-15 Units Subcutaneous TID WC  . ipratropium-albuterol  3 mL Nebulization Q6H  . levothyroxine  50 mcg Oral QAC breakfast  . methylPREDNISolone (SOLU-MEDROL) injection  80 mg Intravenous Q6H  . pantoprazole (PROTONIX) IV  40 mg Intravenous Q24H   Continuous: . sodium chloride 75 mL/hr at 05/12/15 0007  . albuterol 10 mg/hr (05/07/15 1442)   PRN:[DISCONTINUED] acetaminophen **OR** acetaminophen, acetaminophen, albuterol, fentaNYL  (SUBLIMAZE) injection, naLOXone (NARCAN)  injection, ondansetron **OR** ondansetron (ZOFRAN) IV, polyethylene glycol  Assesment: She was admitted with community-acquired pneumonia and exacerbation of asthma/COPD. She developed acute respiratory failure requiring intubation and mechanical ventilation. She is now 48 hours off the ventilator and much improved. She has been smoking at home and says she will stop. She has obstructive sleep apnea and has been noncompliant with C Pap and says she will be compliant now. Active Problems:   CAP (community acquired pneumonia)   Asthma with acute exacerbation   Respiratory failure ([place])   OSA (obstructive sleep apnea)   Bradycardia    Plan: Transfer from ICU and probable discharge tomorrow    LOS: 6 days   Terry Bolotin L 05/12/2015, 10:13 AM

## 2015-05-12 NOTE — Progress Notes (Signed)
Received report from ICU RN

## 2015-05-12 NOTE — Plan of Care (Signed)
Problem: Phase I Progression Outcomes Goal: Pain controlled Outcome: Progressing Pt states she has chronic pain

## 2015-05-13 ENCOUNTER — Other Ambulatory Visit (HOSPITAL_COMMUNITY): Payer: Self-pay

## 2015-05-13 LAB — CREATININE, SERUM
Creatinine, Ser: 0.61 mg/dL (ref 0.44–1.00)
GFR calc non Af Amer: >60 mL/min (ref >60)

## 2015-05-13 LAB — GLUCOSE, CAPILLARY: GLUCOSE-CAPILLARY: 131 mg/dL — AB (ref 65–99)

## 2015-05-13 NOTE — Progress Notes (Signed)
Discharge instructions given with all questions answered. Patient aware that she needs to call and make follow-up appointment with primary. Patient waiting for PICC RN to remove line.

## 2015-05-13 NOTE — Progress Notes (Signed)
Patient left in stable condition with boyfriend and mother. Patient taken out of facility by NT via wheelchair. Patient left in personal vehicle.

## 2015-05-13 NOTE — Care Management Note (Signed)
Case Management Note  Patient Details  Name: Denise Ray MRN: [id number] Date of Birth: [birth year]  Subjective/Objective:                    Action/Plan:   Expected Discharge Date:  05/13/15               Expected Discharge Plan:  Home/Self Care  In-House Referral:  NA  Discharge planning Services  CM Consult  Post Acute Care Choice:  NA Choice offered to:  NA  DME Arranged:    DME Agency:     HH Arranged:    [place] Agency:     Status of Service:  Completed, signed off  Medicare Important Message Given:    Date Medicare IM Given:    Medicare IM give by:    Date Additional Medicare IM Given:    Additional Medicare Important Message give by:     If discussed at [place] of Stay Meetings, dates discussed:    Additional Comments: Pt discharged home today. Pt does not qualify for home O2 at this time. Pt has a neb machine and cpap for home use. [name], RN 05/13/2015, 12:07 PM

## 2015-05-13 NOTE — Progress Notes (Signed)
Pts O2 sats 96% on RA at rest Pts O2 sats ambulating on RA 91-96%  Pt does not qualify for home O2 at this time

## 2015-05-13 NOTE — Discharge Summary (Signed)
Physician Discharge Summary  Patient ID: Denise Ray MRN: [id number] DOB/AGE: 23-Apr-1971 44 y.o. Primary Care Physician:HAWKINS,EDWARD L, MD Admit date: 05/06/2015 Discharge date: 05/13/2015    Discharge Diagnoses:   Active Problems:   CAP (community acquired pneumonia)   Asthma with acute exacerbation   Respiratory failure (HCC)   OSA (obstructive sleep apnea)   Bradycardia     Medication List    STOP taking these medications        Amantadine HCl 100 MG tablet      TAKE these medications        albuterol (5 MG/ML) 0.5% nebulizer solution  Commonly known as:  PROVENTIL  Take 2.5 mg by nebulization every 6 (six) hours as needed for wheezing or shortness of breath.     albuterol 108 (90 BASE) MCG/ACT inhaler  Commonly known as:  PROVENTIL HFA;VENTOLIN HFA  Inhale 2 puffs into the lungs every 6 (six) hours as needed. wheezing     ALPRAZolam 1 MG tablet  Commonly known as:  XANAX  Take 1 mg by mouth 2 (two) times daily.     HYDROcodone-acetaminophen 10-325 MG tablet  Commonly known as:  NORCO  Take 1 tablet by mouth every 6 (six) hours as needed for moderate pain or severe pain.     levofloxacin 500 MG tablet  Commonly known as:  LEVAQUIN  Take 500 mg by mouth daily. 10 day course starting on 05/01/2015     levofloxacin 750 MG tablet  Commonly known as:  LEVAQUIN  Take 1 tablet (750 mg total) by mouth daily.     lidocaine 5 % ointment  Commonly known as:  XYLOCAINE  Apply 1 application topically as needed.     pantoprazole 40 MG tablet  Commonly known as:  PROTONIX  Take 40 mg by mouth daily.     polyethylene glycol packet  Commonly known as:  MIRALAX / GLYCOLAX  Take 17 g by mouth daily as needed for mild constipation.     predniSONE 10 MG tablet  Commonly known as:  DELTASONE  Take 30 mg by mouth daily. To decrease to 20mg  daily for 3 days on 05/07/2015, etc , as directed per tapered instructions     promethazine 12.5 MG tablet  Commonly known  as:  PHENERGAN  Take 1 tablet by mouth every 4 (four) hours as needed for nausea or vomiting.        Discharged Condition: improved    Consults: none  Significant Diagnostic Studies: Ct Head Wo Contrast  05/07/2015  CLINICAL DATA:  Altered mental status EXAM: CT HEAD WITHOUT CONTRAST TECHNIQUE: Contiguous axial images were obtained from the base of the skull through the vertex without intravenous contrast. COMPARISON:  May 05, 2009 FINDINGS: The ventricles are normal in size and configuration. There is no intracranial mass, hemorrhage, extra-axial fluid collection, or midline shift. Gray-white compartments are normal. No acute infarct evident. The bony calvarium appears intact. The mastoid air cells are clear. There are no intraorbital lesions. IMPRESSION: Study within normal limits and stable. Electronically Signed   By: Lowella Grip III M.D.   On: 05/07/2015 16:22   Ct Angio Chest Pe W/cm &/or Wo Cm  05/07/2015  CLINICAL DATA:  COPD exacerbation. Productive cough. Lower extremity pain and swelling. EXAM: CT ANGIOGRAPHY CHEST WITH CONTRAST TECHNIQUE: Multidetector CT imaging of the chest was performed using the standard protocol during bolus administration of intravenous contrast. Multiplanar CT image reconstructions and MIPs were obtained to evaluate the vascular anatomy. CONTRAST:  100 mL OMNIPAQUE IOHEXOL 350 MG/ML SOLN COMPARISON:  Plain film of the chest 05/06/2015. CT chest 10/31/2013. FINDINGS: The study is somewhat degraded by respiratory motion and bolus timing. No pulmonary embolus is identified. There is mild cardiomegaly. No pleural or pericardial effusion. Surgical clips along the anterior margin of the distal esophagus are noted. There is no axillary, hilar or mediastinal lymphadenopathy. The lungs demonstrate dependent atelectasis. Scattered ground-glass attenuation is also identified. Visualized upper abdomen shows no focal abnormality. No focal bony abnormality is  identified. Review of the MIP images confirms the above findings. IMPRESSION: Negative for pulmonary embolus. Cardiomegaly. Scattered ground-glass attenuation in the lungs could be due to atelectasis or possibly mild interstitial edema. Electronically Signed   By: Inge Rise M.D.   On: 05/07/2015 09:26   Dg Chest Port 1 View  05/11/2015  CLINICAL DATA:  Acute onset of shortness of breath. Subsequent encounter. EXAM: PORTABLE CHEST 1 VIEW COMPARISON:  Chest radiograph performed 05/10/2015 FINDINGS: The patient's right PICC is noted ending about the distal SVC. The lungs are mildly hypoexpanded. Mild bibasilar opacities may reflect atelectasis or possibly mild pneumonia. No pleural effusion or pneumothorax is seen. The cardiomediastinal silhouette is mildly enlarged. No acute osseous abnormalities are identified. IMPRESSION: 1. Right PICC noted ending about the distal SVC. 2. Lungs mildly hypoexpanded. Mild bibasilar airspace opacities may reflect atelectasis or possibly mild pneumonia. 3. Mild cardiomegaly. Electronically Signed   By: Garald Balding M.D.   On: 05/11/2015 01:57   Dg Chest Port 1 View  05/10/2015  CLINICAL DATA:  On ventilator, respiratory failure EXAM: PORTABLE CHEST 1 VIEW COMPARISON:  Portable exam 0608 hours compared to 05/09/2015 FINDINGS: Tip of endotracheal tube projects 3.8 cm above carina. Nasogastric tube extends into stomach. EKG leads project over chest. Enlargement of cardiac silhouette with pulmonary vascular congestion. Mild perihilar infiltrates question pulmonary edema. More significant opacity identified in LEFT lower lobe question atelectasis versus infiltrate. No pneumothorax. Small LEFT pleural effusion questioned. No acute osseous findings. IMPRESSION: Probable mild pulmonary edema. Question coexistent atelectasis versus consolidation LEFT lower lobe with suspected small LEFT pleural effusion. Electronically Signed   By: Lavonia Dana M.D.   On: 05/10/2015 08:12   Dg  Chest Port 1 View  05/09/2015  CLINICAL DATA:  Intubated.  Respiratory failure EXAM: PORTABLE CHEST 1 VIEW COMPARISON:  Chest radiograph from one day prior. FINDINGS: Endotracheal tube tip is 3.1 cm above the carina. Enteric tube enters the stomach with the tip not seen on this image. Stable cardiomediastinal silhouette with mild cardiomegaly. No pneumothorax. No pleural effusion. Stable mild to moderate pulmonary edema. IMPRESSION: Stable mild-to-moderate congestive heart failure. Well-positioned endotracheal and enteric tubes. Electronically Signed   By: Ilona Sorrel M.D.   On: 05/09/2015 07:46   Dg Chest Port 1 View  05/08/2015  CLINICAL DATA:  ET tube placement EXAM: PORTABLE CHEST 1 VIEW COMPARISON:  Two days ago FINDINGS: New endotracheal tube with tip at the clavicular heads. Cardiomegaly with congested appearance of pulmonary vessels . No consolidation, edema, effusion, or pneumothorax. IMPRESSION: 1. New endotracheal tube is in good position. 2. Congested appearance of the pulmonary vessels, accentuated by low volumes. Electronically Signed   By: Monte Fantasia M.D.   On: 05/08/2015 04:23   Portable Chest 1 View  05/06/2015  CLINICAL DATA:  Respiratory failure. EXAM: PORTABLE CHEST 1 VIEW COMPARISON:  01/30/2015 chest radiograph. FINDINGS: Low lung volumes. There is apparent mild enlargement of the cardiac silhouette. The mediastinal contour is otherwise within normal limits.  No pneumothorax. No pleural effusion. There are diffuse hazy and linear parahilar opacities bilaterally. IMPRESSION: 1. Apparent mild enlargement of the cardiac silhouette, which could be artifactual due to low lung volumes. 2. Diffuse hazy and linear parahilar opacities bilaterally, most suggestive of moderate pulmonary edema. Consider correlation with BNP and echocardiography to assess for congestive heart failure. Electronically Signed   By: Ilona Sorrel M.D.   On: 05/06/2015 10:41    Lab Results: Basic Metabolic  Panel:  Recent Labs  05/11/15 0510 05/12/15 0431 05/13/15 0647  NA 139 138  --   K 3.7 3.7  --   CL 99* 99*  --   CO2 31 31  --   GLUCOSE 144* 154*  --   BUN 28* 22*  --   CREATININE 0.64 0.59 0.61  CALCIUM 8.5* 8.3*  --    Liver Function Tests:  Recent Labs  05/11/15 0510  AST 34  ALT 29  ALKPHOS 42  BILITOT 0.4  PROT 6.3*  ALBUMIN 3.1*     CBC:  Recent Labs  05/11/15 0510 05/12/15 0431  WBC 12.9* 14.0*  NEUTROABS 10.7* 12.1*  HGB 13.4 12.8  HCT 40.1 38.7  MCV 90.9 90.2  PLT 250 232    Recent Results (from the past 240 hour(s))  MRSA PCR Screening     Status: Abnormal   Collection Time: 05/06/15 10:15 AM  Result Value Ref Range Status   MRSA by PCR POSITIVE (A) NEGATIVE Final    Comment:        The GeneXpert MRSA Assay (FDA approved for NASAL specimens only), is one component of a comprehensive MRSA colonization surveillance program. It is not intended to diagnose MRSA infection nor to guide or monitor treatment for MRSA infections. RESULT CALLED TO, READ BACK BY AND VERIFIED WITH: SPANGLER,E. AT 1828 ON 05/06/2015 BY [facility] Course:   This is a 44 years old female with history of multiple medical illnesses was admitted due to pneumonia. Patient was treated with IV antibiotics and improved. Patient is being discharge on oral antibiotic. She will be followed by her PCP in 2 weeks.  Discharge Exam: Blood pressure 120/66, pulse 57, temperature 98.5 F (36.9 C), temperature source Axillary, resp. rate 20, height 5\' 4"  (1.626 m), weight 113.354 kg (249 lb 14.4 oz), SpO2 96 %.    Disposition:  home        Follow-up Information    Follow up with HAWKINS,EDWARD L, MD In 2 weeks.   Specialty:  Pulmonary Disease   Contact information:   [address] [place] [address] [phone]       Signed: Ivania Teagarden   05/13/2015, 9:48 AM

## 2015-05-14 LAB — HEMOGLOBIN A1C
HEMOGLOBIN A1C: 6.2 % — AB (ref 4.8–5.6)
MEAN PLASMA GLUCOSE: 131 mg/dL

## 2015-05-28 DIAGNOSIS — J449 Chronic obstructive pulmonary disease, unspecified: Secondary | ICD-10-CM | POA: Diagnosis not present

## 2015-05-28 DIAGNOSIS — C210 Malignant neoplasm of anus, unspecified: Secondary | ICD-10-CM | POA: Diagnosis not present

## 2015-05-28 DIAGNOSIS — M545 Low back pain: Secondary | ICD-10-CM | POA: Diagnosis not present

## 2015-05-28 DIAGNOSIS — R5383 Other fatigue: Secondary | ICD-10-CM | POA: Diagnosis not present

## 2015-05-28 MED FILL — LEVOTHYROXINE 50 MCG TABLET: 50 | 90 days supply | Qty: 90 | Fill #0

## 2015-05-28 MED FILL — PANTOPRAZOLE SOD DR 40 MG T: 40 | 90 days supply | Qty: 90 | Fill #0

## 2015-06-03 MED FILL — HYDROCODON-APAP 5-325: 5-325 | 90 days supply | Qty: 360 | Fill #0

## 2015-06-07 MED FILL — ALPRAZolam 1 MG TABS: 1 | 90 days supply | Qty: 180 | Fill #0

## 2015-06-21 ENCOUNTER — Ambulatory Visit (INDEPENDENT_AMBULATORY_CARE_PROVIDER_SITE_OTHER): Payer: 59 | Admitting: Obstetrics & Gynecology

## 2015-06-21 ENCOUNTER — Encounter: Payer: Self-pay | Admitting: Obstetrics & Gynecology

## 2015-06-21 VITALS — BP 108/60 | HR 80 | Resp 18 | Ht 65.0 in | Wt 255.0 lb

## 2015-06-21 DIAGNOSIS — Z01419 Encounter for gynecological examination (general) (routine) without abnormal findings: Secondary | ICD-10-CM | POA: Diagnosis not present

## 2015-06-21 DIAGNOSIS — Z0000 Encounter for general adult medical examination without abnormal findings: Secondary | ICD-10-CM | POA: Diagnosis not present

## 2015-06-21 DIAGNOSIS — Z1151 Encounter for screening for human papillomavirus (HPV): Secondary | ICD-10-CM | POA: Diagnosis not present

## 2015-06-21 DIAGNOSIS — Z124 Encounter for screening for malignant neoplasm of cervix: Secondary | ICD-10-CM | POA: Diagnosis not present

## 2015-06-21 LAB — POCT URINALYSIS DIPSTICK
BILIRUBIN UA: NEGATIVE
GLUCOSE UA: NEGATIVE
KETONES UA: NEGATIVE
Leukocytes, UA: NEGATIVE
Nitrite, UA: NEGATIVE
Protein, UA: NEGATIVE
RBC UA: NEGATIVE
Urobilinogen, UA: NEGATIVE
pH, UA: 5

## 2015-06-21 NOTE — Addendum Note (Signed)
Addended by: Megan Salon on: 06/21/2015 02:50 PM   Modules accepted: Miquel Dunn

## 2015-06-21 NOTE — Progress Notes (Addendum)
45 y.o. G1P1 DivorcedCaucasianF here for annual exam.  Son is doing well.  He is 15 and 6'1".  Pt was hospitalized in December for a week due to respiratory failure.  She was intubated for four days.  This happened 18 months ago.    Pt had AIN 2/3/CIS of the rectum and peri-rectum.  Has anoscopy and laser.  Has follow up with Dr. Marcello Moores next month.    Lab work was just done last month.  Reports thyroid level was normal.  She is not taking any thyroid medication right now.    No LMP recorded. Patient has had a hysterectomy.          Sexually active: Yes.    The current method of family planning is status post hysterectomy.    Exercising: No.  The patient does not participate in regular exercise at present. Smoker:  no  Health Maintenance: Pap: 03/02/12 Neg. HR HPV:neg History of abnormal Pap:  yes MMG: 05/23/14 [place] Right BIRADS1:neg Colonoscopy:  Never BMD:   Never TDaP:  UTD Screening Labs: PCP, Hb today: PCP, Urine today: Pending   reports that she has been smoking Cigarettes.  She has a 30 pack-year smoking history. She has never used smokeless tobacco. She reports that she drinks alcohol. She reports that she does not use illicit drugs.  Past Medical History  Diagnosis Date  . GERD (gastroesophageal reflux disease)   . Migraines   . Arthritis     Right hip  . History of TIA (transient ischemic attack)     Caused by medication reaction  . Asthma   . Anxiety   . Hemorrhoids 05/2014  . Chronic pain   . Hypothyroidism        . OSA (obstructive sleep apnea)   . Borderline diabetes mellitus   . History of pneumonia 6/15    Required ventilatory support  . Mass of lung     Chest wall mass  . COPD with asthma ([place]) 04/2015     Septic and used ventilator for 4 days    Past Surgical History  Procedure Laterality Date  . Conization of cervix  2000  . Hysteroscopy w/d&c  09/12/2007  &  2005  . Chest wall/lung mass resection  7/03  . Robotic assisted total hysterectomy N/A  10/11/2012    Procedure: ROBOTIC ASSISTED TOTAL HYSTERECTOMY;  Surgeon: Lyman Speller, MD;  Location: [facility];  Service: Gynecology;  Laterality: N/A;  . Salpingoophorectomy Bilateral 10/11/2012    Procedure: SALPINGO OOPHORECTOMY;  Surgeon: Lyman Speller, MD;  Location: [facility];  Service: Gynecology;  Laterality: Bilateral;  . Video assisted thoracoscopy (vats)/thorocotomy Right 11/04/2001  . Cesarean section  2001  . Evaluation under anesthesia with hemorrhoidectomy N/A 06/15/2014    Procedure: EXAM UNDER ANESTHESIA WITH HEMORRHOIDECTOMY;  Surgeon: Doreen Salvage, MD;  Location: [facility];  Service: General;  Laterality: N/A;  . Excision of skin tag N/A 06/15/2014    Procedure: EXCISION OF SKIN TAGS;  Surgeon: Doreen Salvage, MD;  Location: [facility];  Service: General;  Laterality: N/A;  . Dilation and curettage of uterus    . Iud removal    . High resolution anoscopy N/A 10/11/2014    Procedure: HIGH RESOLUTION ANOSCOPY WITH BIOPSY;  Surgeon: Leighton Ruff, MD;  Location: [facility];  Service: General;  Laterality: N/A;  . Co2 laser application N/A [id number]    Procedure: CO2 LASER APPLICATION;  Surgeon: Leighton Ruff, MD;  Location: [facility];  Service: General;  Laterality: N/A;  . Rectal exam under anesthesia N/A 10/11/2014    Procedure: ANAL EXAM UNDER ANESTHESIA;  Surgeon: Leighton Ruff, MD;  Location: [facility];  Service: General;  Laterality: N/A;    Current Outpatient Prescriptions  Medication Sig Dispense Refill  . albuterol (PROVENTIL HFA;VENTOLIN HFA) 108 (90 BASE) MCG/ACT inhaler Inhale 2 puffs into the lungs every 6 (six) hours as needed. wheezing 1 Inhaler 11  . albuterol (PROVENTIL) (5 MG/ML) 0.5% nebulizer solution Take 2.5 mg by nebulization every 6 (six) hours as needed for wheezing or shortness of breath.    . ALPRAZolam (XANAX) 1 MG tablet Take 1 mg by mouth 2 (two) times daily.    [name]  HYDROcodone-acetaminophen (NORCO/VICODIN) 5-325 MG tablet Take 1 tablet by mouth every 6 (six) hours as needed for moderate pain.    [name] lidocaine (XYLOCAINE) 5 % ointment Apply 1 application topically as needed. (Patient taking differently: Apply 1 application topically as needed for mild pain or moderate pain. ) 35.44 g 0  . pantoprazole (PROTONIX) 40 MG tablet Take 40 mg by mouth daily.    . polyethylene glycol (MIRALAX / GLYCOLAX) packet Take 17 g by mouth daily as needed for mild constipation.    [name] levothyroxine (SYNTHROID, LEVOTHROID) 50 MCG tablet Take 1 tablet by mouth daily. Reported on 06/21/2015  3   No current facility-administered medications for this visit.    Family History  Problem Relation Age of Onset  . Breast cancer Brother 15  . Breast cancer Maternal Aunt   . Depression Mother   . Anesthesia problems Mother     post-op N/V  . Alcohol abuse Father   . Alcohol abuse Cousin     ROS:  Pertinent items are noted in HPI.  Otherwise, a comprehensive ROS was negative.  Exam:   BP 108/60 mmHg  Pulse 80  Resp 18  Ht 5\' 5"  (1.651 m)  Wt 255 lb (115.667 kg)  BMI 42.43 kg/m2  Weight change: +7#  Height: 5\' 5"  (165.1 cm)  Ht Readings from Last 3 Encounters:  06/21/15 5\' 5"  (1.651 m)  05/09/15 5\' 4"  (1.626 m)  10/05/14 5\' 4"  (1.626 m)    General appearance: alert, cooperative and appears stated age Head: Normocephalic, without obvious abnormality, atraumatic Neck: no adenopathy, supple, symmetrical, trachea midline and thyroid normal to inspection and palpation Breasts: normal appearance, no masses or tenderness Abdomen: soft, non-tender; bowel sounds normal; no masses,  no organomegaly Extremities: extremities normal, atraumatic, no cyanosis or edema Skin: Skin color, texture, turgor normal. No rashes or lesions Lymph nodes: Cervical, supraclavicular, and axillary nodes normal. No abnormal inguinal nodes palpated Neurologic: Grossly normal   Pelvic: External  genitalia:  no lesions              Urethra:  normal appearing urethra with no masses, tenderness or lesions              Bartholins and Skenes: normal                 Vagina: normal appearing vagina with normal color and discharge, no lesions              Cervix: absent              Pap taken: Yes.   Bimanual Exam:  Uterus:  uterus absent              Adnexa: normal adnexa and no mass, fullness, tenderness  Rectovaginal: Confirms               Anus:  normal sphincter tone, smll <1cm rough lesion at 9 o'clock  Chaperone was present for exam.  A:  Well Woman with normal exam H/O Robotic TLH/bilateral salpingectomy 5/14 with h/o abnormal paps and LEEP hx AIN 2/3/CIN.  Has follow up month with Dr. Marcello Moores. Asthma.  Recent hospitalization. Hypothyroidism. Not taking medication regularly.  Reports TSH was normal last month. Brother with breast cancer. Neg genetic testing in pt. Aware should do yearly MMG. Pt hasn't gone yet.   P: Mammogram scheduled for pt Pap and HR HPV pap today. No labs today. Pt will check about having pneumonia vaccination. return annually or prn

## 2015-06-21 NOTE — Patient Instructions (Signed)
Check with Luan Pulling about pneumonia vaccine.

## 2015-06-26 LAB — IPS PAP TEST WITH HPV

## 2015-07-12 ENCOUNTER — Other Ambulatory Visit: Payer: Self-pay | Admitting: Obstetrics & Gynecology

## 2015-07-12 DIAGNOSIS — Z1231 Encounter for screening mammogram for malignant neoplasm of breast: Secondary | ICD-10-CM

## 2015-07-16 ENCOUNTER — Other Ambulatory Visit: Payer: Self-pay

## 2015-07-16 NOTE — Patient Outreach (Signed)
New referral high risk UMR: Placed call to patient who reports that she is doing well. States no problems. Reports she is back to work. Denies any concerns today.  Reminded patient to call [facility] if she needs an assistance in the future.  Plan: member assessed with no intervention needed.  Tomasa Rand, RN, BSN, CEN [facility] [employer] [phone]

## 2015-07-22 ENCOUNTER — Ambulatory Visit (HOSPITAL_COMMUNITY)
Admission: RE | Admit: 2015-07-22 | Discharge: 2015-07-22 | Disposition: A | Discharge status: Home / Self Care | Payer: 59 | Source: Ambulatory Visit | Attending: Obstetrics & Gynecology | Admitting: Obstetrics & Gynecology

## 2015-07-22 DIAGNOSIS — Z1231 Encounter for screening mammogram for malignant neoplasm of breast: Secondary | ICD-10-CM | POA: Diagnosis not present

## 2015-07-22 IMAGING — MG MM DIGITAL SCREENING
8 series · 8 of 24 positions shown · non-contrast
Comparison: Previous exam(s).

CLINICAL DATA: Screening.

EXAM:
DIGITAL SCREENING BILATERAL MAMMOGRAM WITH 3D TOMO WITH CAD

[L MLO]
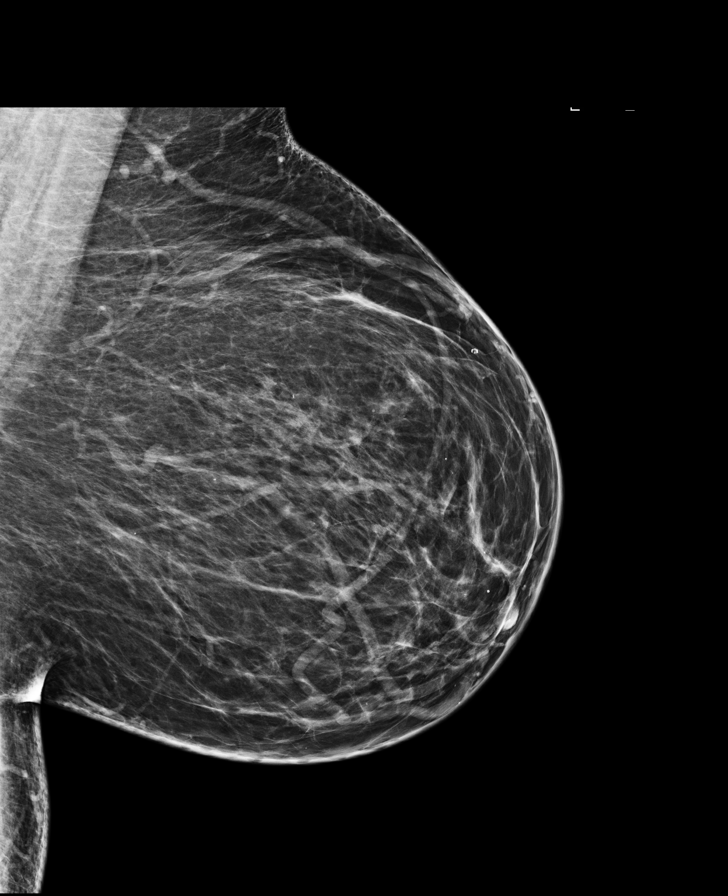

[R MLO]
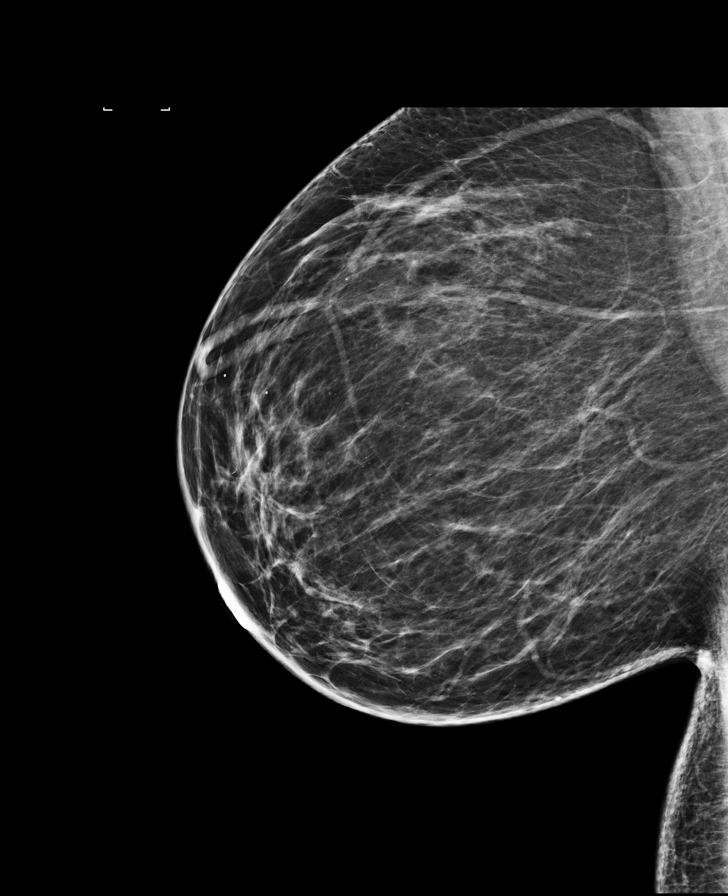

[L CC]
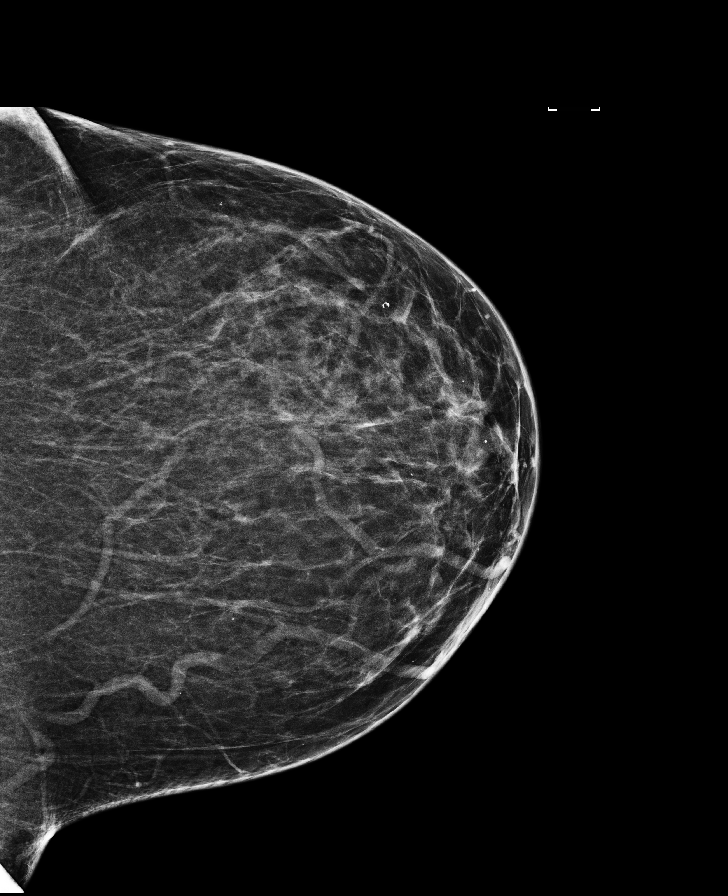

[R CC]
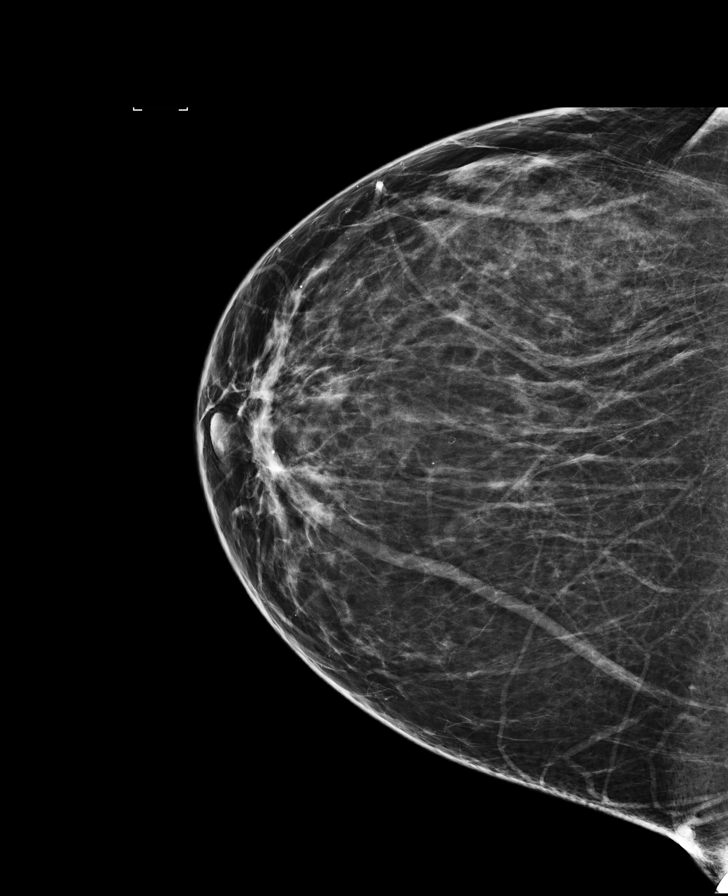

[R CC tomo · tomo slice 35/70.0]
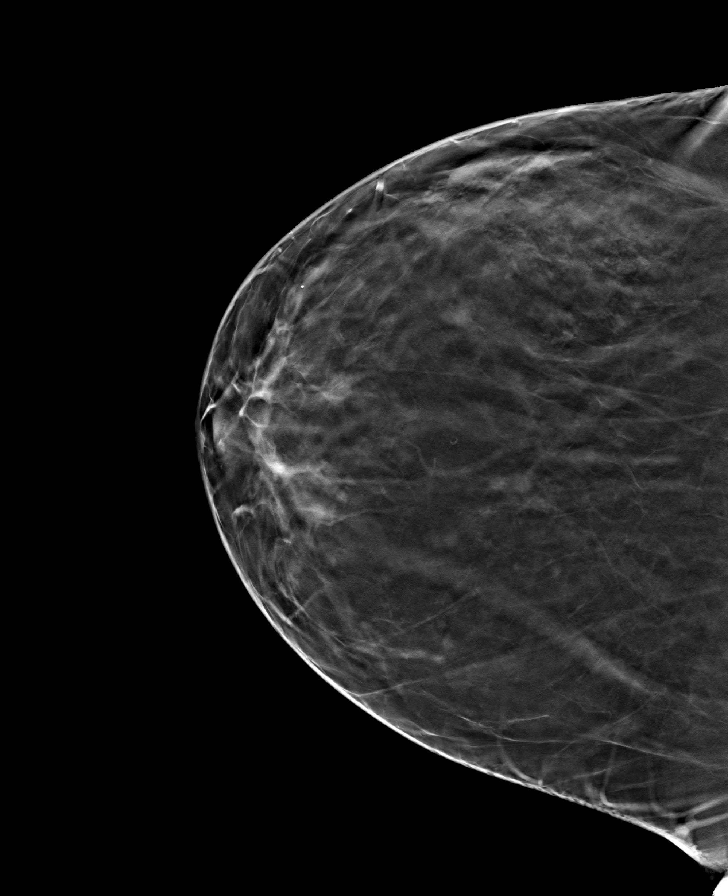

[L MLO tomo · tomo slice 39/76.0]
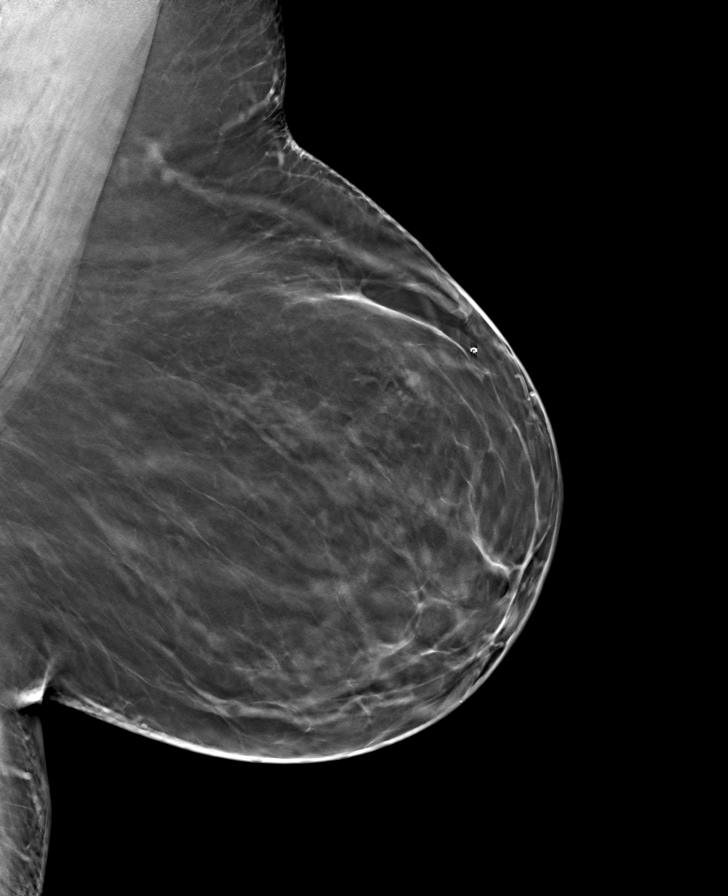

[L CC tomo · tomo slice 35/68.0]
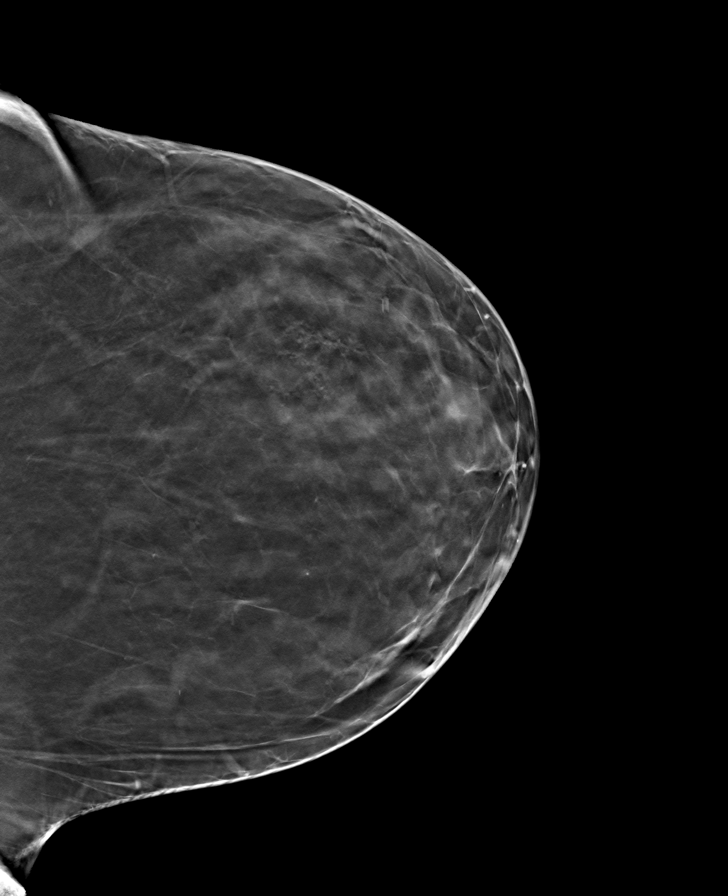

[R MLO tomo · tomo slice 37/74.0]
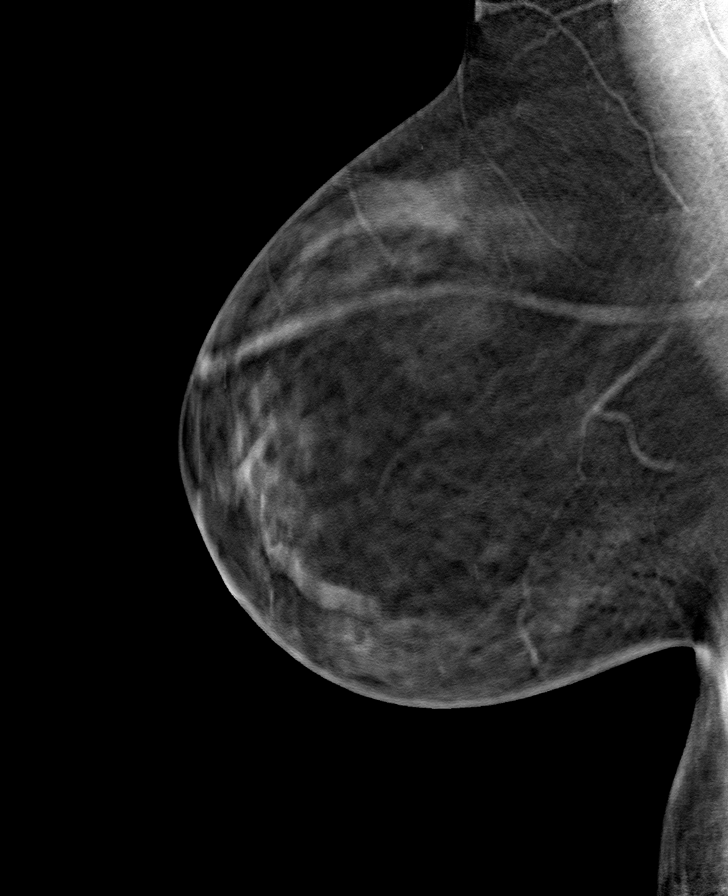

[8 of 24 positions shown; findings below may reference images not displayed]

ACR Breast Density Category b: There are scattered areas of
fibroglandular density.
FINDINGS: There are no findings suspicious for malignancy. Images were
processed with CAD.
IMPRESSION: No mammographic evidence of malignancy. A result letter of this
screening mammogram will be mailed directly to the patient.

RECOMMENDATION:
Screening mammogram in one year. (Code:[F0])

BI-RADS CATEGORY  1: Negative.

## 2015-08-05 DIAGNOSIS — D013 Carcinoma in situ of anus and anal canal: Secondary | ICD-10-CM | POA: Diagnosis not present

## 2015-09-03 DIAGNOSIS — C210 Malignant neoplasm of anus, unspecified: Secondary | ICD-10-CM | POA: Diagnosis not present

## 2015-09-03 DIAGNOSIS — E785 Hyperlipidemia, unspecified: Secondary | ICD-10-CM | POA: Diagnosis not present

## 2015-09-03 DIAGNOSIS — J441 Chronic obstructive pulmonary disease with (acute) exacerbation: Secondary | ICD-10-CM | POA: Diagnosis not present

## 2015-09-03 MED FILL — PANTOPRAZOLE SOD DR 40 MG T: 40 | 90 days supply | Qty: 90 | Fill #1

## 2015-09-03 MED FILL — predniSONE 10 MG TABS: 10 | 12 days supply | Qty: 30 | Fill #0

## 2015-09-03 MED FILL — ALBUTEROL 0.083 MG/ML SOLN: (2.5 MG/3ML | 15 days supply | Qty: 180 | Fill #0

## 2015-09-03 MED FILL — HYDROCODON-APAP 10-325: 10-325 | 30 days supply | Qty: 120 | Fill #0

## 2015-09-03 MED FILL — ALPRAZolam 1 MG TABS: 1 | 90 days supply | Qty: 180 | Fill #1

## 2015-09-03 MED FILL — levoFLOXacin 500 MG TABS: 500 | 10 days supply | Qty: 10 | Fill #0

## 2015-09-03 MED FILL — ATORVASTATIN 20 MG TABLET: 20 | 90 days supply | Qty: 90 | Fill #0

## 2015-09-04 DIAGNOSIS — G4733 Obstructive sleep apnea (adult) (pediatric): Secondary | ICD-10-CM | POA: Diagnosis not present

## 2015-09-04 DIAGNOSIS — J441 Chronic obstructive pulmonary disease with (acute) exacerbation: Secondary | ICD-10-CM | POA: Diagnosis not present

## 2015-09-05 DIAGNOSIS — J441 Chronic obstructive pulmonary disease with (acute) exacerbation: Secondary | ICD-10-CM | POA: Diagnosis not present

## 2015-09-06 DIAGNOSIS — J441 Chronic obstructive pulmonary disease with (acute) exacerbation: Secondary | ICD-10-CM | POA: Diagnosis not present

## 2015-09-06 DIAGNOSIS — G4733 Obstructive sleep apnea (adult) (pediatric): Secondary | ICD-10-CM | POA: Diagnosis not present

## 2015-10-04 MED FILL — HYDROCODON-APAP 10-325: 10-325 | 30 days supply | Qty: 120 | Fill #0

## 2015-10-10 DIAGNOSIS — M545 Low back pain: Secondary | ICD-10-CM | POA: Diagnosis not present

## 2015-10-10 DIAGNOSIS — J449 Chronic obstructive pulmonary disease, unspecified: Secondary | ICD-10-CM | POA: Diagnosis not present

## 2015-10-10 DIAGNOSIS — G4733 Obstructive sleep apnea (adult) (pediatric): Secondary | ICD-10-CM | POA: Diagnosis not present

## 2015-10-10 DIAGNOSIS — C210 Malignant neoplasm of anus, unspecified: Secondary | ICD-10-CM | POA: Diagnosis not present

## 2015-11-01 MED FILL — HYDROCODON-APAP 10-325: 10-325 | 30 days supply | Qty: 120 | Fill #0

## 2015-11-08 ENCOUNTER — Other Ambulatory Visit: Payer: Self-pay | Admitting: General Surgery

## 2015-11-08 DIAGNOSIS — D013 Carcinoma in situ of anus and anal canal: Secondary | ICD-10-CM | POA: Diagnosis not present

## 2015-11-08 NOTE — H&P (Signed)
History of Present Illness Leighton Ruff MD; Q000111Q 11:05 AM) The patient is a 45 year old female who presents with anal lesions. Patient underwent hemorrhoidectomy and removal of anal condyloma ~1 yr ago. Her pathology showed AIN 2 and 3 within the biopsy specimens. The findings of this are documented in the operative note. She is having regular bowel movements. She denies any rectal bleeding but does complain with some soreness after bowel movements. She has no family history of anal cancer but does have a couple of uncles with early onset colon cancer. She has a h/o abnormal pap smears but the last few have been normal per pt. she has noticed that the right anterior lesion has become more bothersome recently.   Problem List/Past Medical Leighton Ruff, MD; Q000111Q 11:05 AM) ANAL INTRAEPITHELIAL NEOPLASIA III (D01.3) INTERNAL HEMORRHOID (K64.8) ANAL CONDYLOMA (A63.0) POST-OPERATIVE STATE [phone])  Other Problems Leighton Ruff, MD; Q000111Q 11:05 AM) Other disease, cancer, significant illness Migraine Headache Hypercholesterolemia Thyroid Disease Sleep Apnea Cerebrovascular Accident Asthma Anxiety Disorder Gastroesophageal Reflux Disease Depression Cervical Cancer  Past Surgical History Leighton Ruff, MD; Q000111Q 11:05 AM) SURGICAL ANORECTAL EXAMINATION UNDER ANESTHESIA (16109) with BX Cesarean Section - 1 Lung Surgery Right. Hysterectomy (not due to cancer) - Partial Hysterectomy (due to cancer) - Partial  Diagnostic Studies History Leighton Ruff, MD; Q000111Q 11:05 AM) Pap Smear 1-5 years ago Colonoscopy never  Allergies Elbert Ewings, CMA; 11/08/2015 10:40 AM) Morphine Derivatives Estrogens Conjugated *Estrogens Wellbutrin SR *ANTIDEPRESSANTS* Ibuprofen *ANALGESICS - ANTI-INFLAMMATORY* Morphine Sulfate *ANALGESICS - OPIOID*  Medication History Elbert Ewings, CMA; 11/08/2015 10:40 AM) Albuterol Sulfate HFA (108 (90  Base)MCG/ACT Aerosol Soln, Inhalation) Active. Albuterol Sulfate ((5 MG/ML)0.5% Nebulized Soln, Inhalation) Active. Hydrocodone-Acetaminophen (5-325MG  Tablet, Oral) Active. ALPRAZolam (1MG  Tablet, Oral) Active. Levothyroxine Sodium (50MCG Tablet, Oral) Active. Pantoprazole Sodium (40MG  Tablet DR, Oral) Active. Medications Reconciled  Social History Leighton Ruff, MD; Q000111Q 11:05 AM) Tobacco use Current some day smoker. Caffeine use Carbonated beverages. Alcohol use Remotely quit alcohol use.  Family History Leighton Ruff, MD; Q000111Q 11:05 AM) Breast Cancer Brother, Family Members In General. Arthritis Mother. Alcohol Abuse Family Members In General, Father. Depression Mother. Colon Polyps Father. Colon Cancer Family Members In General. Heart disease in female family member before age 63 Heart Disease Family Members In General, Mother. Diabetes Mellitus Family Members In General, Father. Respiratory Condition Family Members In General, Mother. Migraine Headache Brother. Hypertension Family Members In General, Father, Mother.  Pregnancy / Birth History Leighton Ruff, MD; Q000111Q 11:05 AM) Age at menarche 12 years. Irregular periods Gravida 1 Contraceptive History Intrauterine device, Oral contraceptives. Para 1 Maternal age 31-30     Review of Systems Leighton Ruff MD; Q000111Q 11:05 AM) General Present- Fatigue. Not Present- Appetite Loss, Chills, Fever, Night Sweats, Weight Gain and Weight Loss. Skin Not Present- Change in Wart/Mole, Dryness, Hives, Jaundice, New Lesions, Non-Healing Wounds, Rash and Ulcer. HEENT Not Present- Earache, Hearing Loss, Hoarseness, Nose Bleed, Oral Ulcers, Ringing in the Ears, Seasonal Allergies, Sinus Pain, Sore Throat, Visual Disturbances, Wears glasses/contact lenses and Yellow Eyes. Respiratory Not Present- Bloody sputum, Chronic Cough, Difficulty Breathing, Snoring and Wheezing. Cardiovascular Present-  Shortness of Breath. Not Present- Chest Pain, Difficulty Breathing Lying Down, Leg Cramps, Palpitations, Rapid Heart Rate and Swelling of Extremities. Gastrointestinal Present- Bloody Stool. Not Present- Abdominal Pain, Bloating, Change in Bowel Habits, Chronic diarrhea, Constipation, Difficulty Swallowing, Excessive gas, Gets full quickly at meals, Hemorrhoids, Indigestion, Nausea, Rectal Pain and Vomiting. Musculoskeletal Present- Back Pain. Not Present- Joint Pain, Joint Stiffness, Muscle Pain,  Muscle Weakness and Swelling of Extremities. Neurological Present- Headaches. Not Present- Decreased Memory, Fainting, Numbness, Seizures, Tingling, Tremor, Trouble walking and Weakness. Endocrine Not Present- Cold Intolerance, Excessive Hunger, Hair Changes, Heat Intolerance, Hot flashes and New Diabetes.  Vitals Elbert Ewings CMA; 11/08/2015 10:41 AM) 11/08/2015 10:40 AM Weight: 269 lb Height: 64in Body Surface Area: 2.22 m Body Mass Index: 46.17 kg/m  Temp.: 97.81F(Temporal)  Pulse: 80 (Regular)  BP: 138/78 (Sitting, Left Arm, Standard)      Physical Exam Leighton Ruff MD; Q000111Q 11:06 AM)  General Mental Status-Alert. General Appearance-Consistent with stated age. Hydration-Well hydrated. Voice-Normal.  Head and Neck Head-normocephalic, atraumatic with no lesions or palpable masses. Trachea-midline.  Eye Eyeball - Bilateral-Extraocular movements intact. Sclera/Conjunctiva - Bilateral-No scleral icterus.  Abdomen Palpation/Percussion Palpation and Percussion of the abdomen reveal - Soft, Non Tender, No Rebound tenderness, No Rigidity (guarding) and No hepatosplenomegaly.  Rectal Anorectal Exam External - Note: Right anterior external lesion noted with ulceration. Internal - generalized tenderness.   Results Leighton Ruff MD; Q000111Q 11:07 AM) Procedures  Name Value Date ANOSCOPY, DIAGNOSTIC [id number]) [ Hemorrhoids ] Procedure Other:  Procedure: Anoscopy Surgeon: Marcello Moores After the risks and benefits were explained, verbal consent was obtained for above procedure. A medical assistant chaperone was present thoroughout the entire procedure. Anesthesia: none Diagnosis: History of AIN Findings: Right anterior external margin lesion noted, no suspicious internal lesions noted. Significant sphincter hypertension. No fissure noted. Tenderness to palpation noted.  Performed: 11/08/2015 11:06 AM    Assessment & Plan Leighton Ruff MD; Q000111Q 11:06 AM)  ANAL INTRAEPITHELIAL NEOPLASIA III (D01.3) Impression: 45 year old female who underwent anal mapping approximately 1 year ago for AIN. She presents to the office with an enlarging external lesion. I think this will most likely be further AIN. I have recommended another exam under anesthesia with excision of this area and possible biopsy of any other suspicious lesions. She had significant trouble with urinary retention after the last surgery, so we will place a Foley upon discharge after the surgery.

## 2015-11-29 MED FILL — PANTOPRAZOLE SOD DR 40 MG T: 40 | 90 days supply | Qty: 90 | Fill #2

## 2015-11-29 MED FILL — HYDROCODON-APAP 10-325: 10-325 | 30 days supply | Qty: 120 | Fill #0

## 2015-11-30 MED FILL — ALPRAZolam 1 MG TABS: 1 | 90 days supply | Qty: 180 | Fill #0

## 2015-12-12 ENCOUNTER — Encounter (HOSPITAL_BASED_OUTPATIENT_CLINIC_OR_DEPARTMENT_OTHER): Payer: Self-pay | Admitting: *Deleted

## 2015-12-13 ENCOUNTER — Encounter (HOSPITAL_BASED_OUTPATIENT_CLINIC_OR_DEPARTMENT_OTHER): Payer: Self-pay | Admitting: *Deleted

## 2015-12-13 NOTE — Progress Notes (Signed)
NPO AFTER MN.  ARRIVE AT 0700.  NEEDS ISTAT.  CURRENT EKG IN CHART AND EPIC.  WILL TAKE PROTONIX AM DOS W/ SIPS OF WATER.  WILL BRING CPAP.

## 2015-12-17 NOTE — Anesthesia Preprocedure Evaluation (Signed)
Anesthesia Evaluation  Patient identified by MRN, date of birth, ID band Patient awake    Reviewed: Allergy & Precautions, NPO status , Patient's Chart, lab work & pertinent test results  Airway Mallampati: II  TM Distance: >3 FB Neck ROM: Full    Dental no notable dental hx.    Pulmonary asthma , sleep apnea , pneumonia, COPD, former smoker,    Pulmonary exam normal breath sounds clear to auscultation       Cardiovascular negative cardio ROS Normal cardiovascular exam Rhythm:Regular Rate:Normal     Neuro/Psych  Headaches, PSYCHIATRIC DISORDERS Anxiety Depression    GI/Hepatic Neg liver ROS, GERD  ,  Endo/Other  Hypothyroidism Morbid obesity  Renal/GU negative Renal ROS  negative genitourinary   Musculoskeletal  (+) Arthritis ,   Abdominal (+) + obese,   Peds negative pediatric ROS (+)  Hematology negative hematology ROS (+)   Anesthesia Other Findings   Reproductive/Obstetrics negative OB ROS                             Anesthesia Physical Anesthesia Plan  ASA: III  Anesthesia Plan: MAC   Post-op Pain Management:    Induction: Intravenous  Airway Management Planned: Natural Airway  Additional Equipment:   Intra-op Plan:   Post-operative Plan:   Informed Consent: I have reviewed the patients History and Physical, chart, labs and discussed the procedure including the risks, benefits and alternatives for the proposed anesthesia with the patient or authorized representative who has indicated his/her understanding and acceptance.   Dental advisory given  Plan Discussed with: CRNA  Anesthesia Plan Comments:         Anesthesia Quick Evaluation

## 2015-12-18 ENCOUNTER — Ambulatory Visit (HOSPITAL_BASED_OUTPATIENT_CLINIC_OR_DEPARTMENT_OTHER): Payer: 59 | Admitting: Anesthesiology

## 2015-12-18 ENCOUNTER — Encounter (HOSPITAL_BASED_OUTPATIENT_CLINIC_OR_DEPARTMENT_OTHER)
Admission: RE | Disposition: A | Discharge status: Home / Self Care | Payer: Self-pay | Source: Ambulatory Visit | Attending: General Surgery

## 2015-12-18 ENCOUNTER — Ambulatory Visit (HOSPITAL_BASED_OUTPATIENT_CLINIC_OR_DEPARTMENT_OTHER)
Admission: RE | Admit: 2015-12-18 | Discharge: 2015-12-18 | Disposition: A | Discharge status: Home / Self Care | Payer: 59 | Source: Ambulatory Visit | Attending: General Surgery | Admitting: General Surgery

## 2015-12-18 ENCOUNTER — Encounter (HOSPITAL_BASED_OUTPATIENT_CLINIC_OR_DEPARTMENT_OTHER): Payer: Self-pay | Admitting: *Deleted

## 2015-12-18 DIAGNOSIS — E039 Hypothyroidism, unspecified: Secondary | ICD-10-CM | POA: Insufficient documentation

## 2015-12-18 DIAGNOSIS — K6289 Other specified diseases of anus and rectum: Secondary | ICD-10-CM | POA: Diagnosis not present

## 2015-12-18 DIAGNOSIS — K219 Gastro-esophageal reflux disease without esophagitis: Secondary | ICD-10-CM | POA: Diagnosis not present

## 2015-12-18 DIAGNOSIS — D013 Carcinoma in situ of anus and anal canal: Secondary | ICD-10-CM | POA: Insufficient documentation

## 2015-12-18 DIAGNOSIS — Z6841 Body Mass Index (BMI) 40.0 and over, adult: Secondary | ICD-10-CM | POA: Insufficient documentation

## 2015-12-18 DIAGNOSIS — Z79891 Long term (current) use of opiate analgesic: Secondary | ICD-10-CM | POA: Insufficient documentation

## 2015-12-18 DIAGNOSIS — K629 Disease of anus and rectum, unspecified: Secondary | ICD-10-CM | POA: Diagnosis present

## 2015-12-18 DIAGNOSIS — J449 Chronic obstructive pulmonary disease, unspecified: Secondary | ICD-10-CM | POA: Diagnosis not present

## 2015-12-18 DIAGNOSIS — M1990 Unspecified osteoarthritis, unspecified site: Secondary | ICD-10-CM | POA: Insufficient documentation

## 2015-12-18 DIAGNOSIS — G4730 Sleep apnea, unspecified: Secondary | ICD-10-CM | POA: Diagnosis not present

## 2015-12-18 DIAGNOSIS — Z79899 Other long term (current) drug therapy: Secondary | ICD-10-CM | POA: Insufficient documentation

## 2015-12-18 DIAGNOSIS — F17200 Nicotine dependence, unspecified, uncomplicated: Secondary | ICD-10-CM | POA: Insufficient documentation

## 2015-12-18 DIAGNOSIS — F419 Anxiety disorder, unspecified: Secondary | ICD-10-CM | POA: Insufficient documentation

## 2015-12-18 HISTORY — DX: Personal history of other endocrine, nutritional and metabolic disease: Z86.39

## 2015-12-18 HISTORY — DX: Obstructive sleep apnea (adult) (pediatric): G47.33

## 2015-12-18 HISTORY — DX: Personal history of other diseases of the respiratory system: Z87.09

## 2015-12-18 HISTORY — DX: Disease of anus and rectum, unspecified: K62.9

## 2015-12-18 HISTORY — PX: RECTAL EXAM UNDER ANESTHESIA: SHX6399

## 2015-12-18 HISTORY — DX: Dependence on other enabling machines and devices: Z99.89

## 2015-12-18 HISTORY — DX: Personal history of other diseases of the digestive system: Z87.19

## 2015-12-18 HISTORY — DX: Personal history of cervical dysplasia: Z87.410

## 2015-12-18 LAB — POCT I-STAT 4, (NA,K, GLUC, HGB,HCT)
GLUCOSE: 94 mg/dL (ref 65–99)
HEMATOCRIT: 40 % (ref 36.0–46.0)
Hemoglobin: 13.6 g/dL (ref 12.0–15.0)
POTASSIUM: 3.4 mmol/L — AB (ref 3.5–5.1)
SODIUM: 142 mmol/L (ref 135–145)

## 2015-12-18 SURGERY — EXAM UNDER ANESTHESIA, RECTUM
Anesthesia: Monitor Anesthesia Care | Site: Rectum

## 2015-12-18 MED ORDER — MIDAZOLAM HCL 5 MG/5ML IJ SOLN
INTRAMUSCULAR | Status: DC | PRN
  Administered 2015-12-18 (×2): 1 mg via INTRAVENOUS
  Administered 2015-12-18: 2 mg via INTRAVENOUS

## 2015-12-18 MED ORDER — ONDANSETRON HCL 4 MG/2ML IJ SOLN
INTRAMUSCULAR | Status: DC | PRN
  Administered 2015-12-18: 4 mg via INTRAVENOUS

## 2015-12-18 MED ORDER — PROMETHAZINE HCL 25 MG/ML IJ SOLN
6.2500 mg | INTRAMUSCULAR | Status: DC | PRN
  Filled 2015-12-18: qty 1

## 2015-12-18 MED ORDER — HYDROCODONE-ACETAMINOPHEN 5-325 MG PO TABS: 1.0000 | ORAL_TABLET | Freq: Four times a day (QID) | ORAL | 0 refills | Status: DC | PRN

## 2015-12-18 MED ORDER — MIDAZOLAM HCL 2 MG/2ML IJ SOLN
INTRAMUSCULAR | Status: AC
Start: 2015-12-18 — End: 2015-12-18
  Filled 2015-12-18: qty 2

## 2015-12-18 MED ORDER — ACETIC ACID 5 % SOLN
Status: DC | PRN
  Administered 2015-12-18: 1 via TOPICAL

## 2015-12-18 MED ORDER — LIDOCAINE 5 % EX OINT
TOPICAL_OINTMENT | CUTANEOUS | Status: DC | PRN
  Administered 2015-12-18: 1

## 2015-12-18 MED ORDER — DEXAMETHASONE SODIUM PHOSPHATE 4 MG/ML IJ SOLN
INTRAMUSCULAR | Status: DC | PRN
  Administered 2015-12-18: 10 mg via INTRAVENOUS

## 2015-12-18 MED ORDER — KETAMINE HCL 10 MG/ML IJ SOLN
INTRAMUSCULAR | Status: DC | PRN
  Administered 2015-12-18 (×4): 10 mg via INTRAVENOUS

## 2015-12-18 MED ORDER — CHLORHEXIDINE GLUCONATE CLOTH 2 % EX PADS
6.0000 | MEDICATED_PAD | Freq: Once | CUTANEOUS | Status: DC
  Filled 2015-12-18: qty 6

## 2015-12-18 MED ORDER — FENTANYL CITRATE (PF) 100 MCG/2ML IJ SOLN
25.0000 ug | INTRAMUSCULAR | Status: DC | PRN
  Filled 2015-12-18: qty 1

## 2015-12-18 MED ORDER — ONDANSETRON HCL 4 MG/2ML IJ SOLN
INTRAMUSCULAR | Status: AC
  Filled 2015-12-18: qty 2

## 2015-12-18 MED ORDER — LIDOCAINE HCL (CARDIAC) 20 MG/ML IV SOLN
INTRAVENOUS | Status: DC | PRN
  Administered 2015-12-18: 60 mg via INTRAVENOUS

## 2015-12-18 MED ORDER — BUPIVACAINE-EPINEPHRINE 0.5% -1:200000 IJ SOLN
INTRAMUSCULAR | Status: DC | PRN
  Administered 2015-12-18: 20 mL

## 2015-12-18 MED ORDER — LIDOCAINE HCL (CARDIAC) 20 MG/ML IV SOLN
INTRAVENOUS | Status: AC
  Filled 2015-12-18: qty 5

## 2015-12-18 MED ORDER — PROPOFOL 500 MG/50ML IV EMUL
INTRAVENOUS | Status: AC
  Filled 2015-12-18: qty 50

## 2015-12-18 MED ORDER — FENTANYL CITRATE (PF) 100 MCG/2ML IJ SOLN
INTRAMUSCULAR | Status: AC
Start: 2015-12-18 — End: 2015-12-18
  Filled 2015-12-18: qty 2

## 2015-12-18 MED ORDER — PROPOFOL 500 MG/50ML IV EMUL
INTRAVENOUS | Status: DC | PRN
  Administered 2015-12-18: 100 ug/kg/min via INTRAVENOUS

## 2015-12-18 MED ORDER — SODIUM CHLORIDE 0.9 % IR SOLN
Status: DC | PRN
  Administered 2015-12-18: 500 mL

## 2015-12-18 MED ORDER — FENTANYL CITRATE (PF) 100 MCG/2ML IJ SOLN
INTRAMUSCULAR | Status: DC | PRN
  Administered 2015-12-18: 25 ug via INTRAVENOUS

## 2015-12-18 MED ORDER — LACTATED RINGERS IV SOLN
INTRAVENOUS | Status: DC
  Administered 2015-12-18: 08:00:00 via INTRAVENOUS
  Filled 2015-12-18: qty 1000

## 2015-12-18 MED ORDER — KETAMINE HCL 10 MG/ML IJ SOLN
INTRAMUSCULAR | Status: AC
  Filled 2015-12-18: qty 1

## 2015-12-18 MED ORDER — DEXAMETHASONE SODIUM PHOSPHATE 10 MG/ML IJ SOLN
INTRAMUSCULAR | Status: AC
  Filled 2015-12-18: qty 1

## 2015-12-18 MED ORDER — MIDAZOLAM HCL 2 MG/2ML IJ SOLN
INTRAMUSCULAR | Status: AC
  Filled 2015-12-18: qty 2

## 2015-12-18 SURGICAL SUPPLY — 36 items
APL SKNCLS STERI-STRIP NONHPOA (GAUZE/BANDAGES/DRESSINGS) ×1
BENZOIN TINCTURE PRP APPL 2/3 (GAUZE/BANDAGES/DRESSINGS) ×2 IMPLANT
BLADE HEX COATED 2.75 (ELECTRODE) ×2 IMPLANT
BLADE SURG 10 STRL SS (BLADE) IMPLANT
BLADE SURG 15 STRL LF DISP TIS (BLADE) ×1 IMPLANT
BLADE SURG 15 STRL SS (BLADE) ×1
BRIEF STRETCH FOR OB PAD LRG (UNDERPADS AND DIAPERS) ×2 IMPLANT
CANISTER SUCTION 2500CC (MISCELLANEOUS) ×2 IMPLANT
COVER BACK TABLE 60X90IN (DRAPES) ×2 IMPLANT
COVER MAYO STAND STRL (DRAPES) ×2 IMPLANT
DRAPE LAPAROTOMY 100X72 PEDS (DRAPES) ×2 IMPLANT
DRAPE UTILITY XL STRL (DRAPES) ×2 IMPLANT
DRSG PAD ABDOMINAL 8X10 ST (GAUZE/BANDAGES/DRESSINGS) ×2 IMPLANT
ELECT BLADE 6.5 .24CM SHAFT (ELECTRODE) IMPLANT
ELECT REM PT RETURN 9FT ADLT (ELECTROSURGICAL) ×2
ELECTRODE REM PT RTRN 9FT ADLT (ELECTROSURGICAL) ×1 IMPLANT
GAUZE SPONGE 4X4 16PLY XRAY LF (GAUZE/BANDAGES/DRESSINGS) ×2 IMPLANT
GLOVE BIO SURGEON STRL SZ 6.5 (GLOVE) ×2 IMPLANT
GLOVE INDICATOR 7.0 STRL GRN (GLOVE) ×2 IMPLANT
GOWN STRL REUS W/ TWL XL LVL3 (GOWN DISPOSABLE) ×1 IMPLANT
GOWN STRL REUS W/TWL 2XL LVL3 (GOWN DISPOSABLE) ×2 IMPLANT
GOWN STRL REUS W/TWL XL LVL3 (GOWN DISPOSABLE) ×2
KIT ROOM TURNOVER WOR (KITS) ×2 IMPLANT
NS IRRIG 500ML POUR BTL (IV SOLUTION) ×2 IMPLANT
PACK BASIN DAY SURGERY FS (CUSTOM PROCEDURE TRAY) ×2 IMPLANT
PAD ARMBOARD 7.5X6 YLW CONV (MISCELLANEOUS) ×2 IMPLANT
PENCIL BUTTON HOLSTER BLD 10FT (ELECTRODE) ×2 IMPLANT
SPONGE GAUZE 4X4 12PLY STER LF (GAUZE/BANDAGES/DRESSINGS) ×2 IMPLANT
SUT CHROMIC 2 0 SH (SUTURE) ×2 IMPLANT
SUT CHROMIC 3 0 SH 27 (SUTURE) ×2 IMPLANT
SYR CONTROL 10ML LL (SYRINGE) ×2 IMPLANT
TOWEL OR 17X24 6PK STRL BLUE (TOWEL DISPOSABLE) ×4 IMPLANT
TRAY DSU PREP LF (CUSTOM PROCEDURE TRAY) ×2 IMPLANT
TRAY FOLEY CATH SILVER 14FR (SET/KITS/TRAYS/PACK) ×2 IMPLANT
TUBE CONNECTING 12X1/4 (SUCTIONS) ×2 IMPLANT
YANKAUER SUCT BULB TIP NO VENT (SUCTIONS) ×2 IMPLANT

## 2015-12-18 NOTE — Transfer of Care (Signed)
  Last Vitals:  Vitals:   12/18/15 0658  BP: 123/67  Pulse: 78  Resp: 16  Temp: 36.9 C    Last Pain:  Vitals:   12/18/15 0703  TempSrc:   PainSc: 4       Patients Stated Pain Goal: 7 (12/18/15 0703)  Immediate Anesthesia Transfer of Care Note  Patient: Denise Ray  Procedure(s) Performed: Procedure(s) (LRB): ANAL EXAM UNDER ANESTHESIA  EXCISION ANAL MARGIN LESION POSSIBLE ANAL BIOPSIES (N/A)  Patient Location: PACU  Anesthesia Type:MAC  Level of Consciousness: drowsy  Airway & Oxygen Therapy: Patient Spontanous Breathing and Patient connected to face mask oxygen  Post-op Assessment: Report given to PACU RN and Post -op Vital signs reviewed and stable  Post vital signs: Reviewed and stable  Complications: No apparent anesthesia complications

## 2015-12-18 NOTE — Op Note (Signed)
12/18/2015  9:18 AM  PATIENT:  Denise Ray  45 y.o. female  Patient Care Team: [name], MD as PCP - General (Pulmonary Disease)  PRE-OPERATIVE DIAGNOSIS:  Anal margin lesion. H/O AIN  POST-OPERATIVE DIAGNOSIS:  Anal margin lesion  PROCEDURE:  ANAL EXAM UNDER ANESTHESIA  EXCISION ANAL MARGIN LESION    Surgeon(s): Leighton Ruff, MD  ANESTHESIA:   local and MAC  EBL: 79ml  PATIENT DISPOSITION:  PACU - hemodynamically stable.   INDICATION: 44 y.o. F with h/o AIN who has developed a new anterior lesion  OR FINDINGS: anterior lesion concerning for AIN, diffuse acetowhite staining internally with no dominant lesions present  DESCRIPTION:   The patient was identified in the preoperative holding area and taken to the OR where they were laid prone on the operating room table in jack knife position. MAC anesthesia was smoothly induced.  The patient was then prepped and draped in the usual sterile fashion. A surgical timeout was performed indicating the correct patient, procedure, positioning and preoperative antibioitics. SCDs were noted to be in place and functioning prior to the operation.  A rectal block was completed with 0.5% Marcaine.  After this was completed, a sponge was soaked in 5% acetic acid was placed over the perianal region. This was allowed to soak for 2 minutes. The sponge was removed and the perianal region was evaluated.  There was one lesion that stained in the anterior anal margin.  The internal anal canal was evaluated via anoscopy with a Hill-Ferguson anoscope.  There were no dominant lesions.  There was diffuse staining with acetic acid.  I excised the anterior anal margin lesion with a 15 blade scalpel.  The site was closed with interrupted 2-0 and 3-0 Chromic sutures.  Hemostasis was good.    I then switched to clean gloves and prepped the urethral meatus with betadine.  A foley catheter was inserted sterilely.  This will stay in upon discharge to help with  her known post operative urinary retention.  A sterile dressing and lidocaine ointment were placed on the anal lesion.  The patient was awakened from anesthesia and sent to the PACU in stable condition.  All counts were correct per OR staff.

## 2015-12-18 NOTE — H&P (Signed)
History of Present Illness  The patient is a 45 year old female who presents with anal lesions. Patient underwent hemorrhoidectomy and removal of anal condyloma ~1 yr ago. Her pathology showed AIN 2 and 3 within the biopsy specimens. The findings of this are documented in the operative note. She is having regular bowel movements. She denies any rectal bleeding but does complain with some soreness after bowel movements. She has no family history of anal cancer but does have a couple of uncles with early onset colon cancer. She has a h/o abnormal pap smears but the last few have been normal per pt. she has noticed that the right anterior lesion has become more bothersome recently.   Problem List/Past Medical Leighton Ruff, MD; Q000111Q 11:05 AM) ANAL INTRAEPITHELIAL NEOPLASIA III (D01.3) INTERNAL HEMORRHOID (K64.8) ANAL CONDYLOMA (A63.0) POST-OPERATIVE STATE [phone])  Other Problems Leighton Ruff, MD; Q000111Q 11:05 AM) Other disease, cancer, significant illness Migraine Headache Hypercholesterolemia Thyroid Disease Sleep Apnea Cerebrovascular Accident Asthma Anxiety Disorder Gastroesophageal Reflux Disease Depression Cervical Cancer  Past Surgical History Leighton Ruff, MD; Q000111Q 11:05 AM) SURGICAL ANORECTAL EXAMINATION UNDER ANESTHESIA (09811) with BX Cesarean Section - 1 Lung Surgery Right. Hysterectomy (not due to cancer) - Partial Hysterectomy (due to cancer) - Partial  Diagnostic Studies History Leighton Ruff, MD; Q000111Q 11:05 AM) Pap Smear 1-5 years ago Colonoscopy never  Allergies Elbert Ewings, CMA; 11/08/2015 10:40 AM) Morphine Derivatives Estrogens Conjugated *Estrogens Wellbutrin SR *ANTIDEPRESSANTS* Ibuprofen *ANALGESICS - ANTI-INFLAMMATORY* Morphine Sulfate *ANALGESICS - OPIOID*  Medication History Elbert Ewings, CMA; 11/08/2015 10:40 AM) Albuterol Sulfate HFA (108 (90 Base)MCG/ACT Aerosol Soln, Inhalation)  Active. Albuterol Sulfate ((5 MG/ML)0.5% Nebulized Soln, Inhalation) Active. Hydrocodone-Acetaminophen (5-325MG  Tablet, Oral) Active. ALPRAZolam (1MG  Tablet, Oral) Active. Levothyroxine Sodium (50MCG Tablet, Oral) Active. Pantoprazole Sodium (40MG  Tablet DR, Oral) Active. Medications Reconciled  Social History Leighton Ruff, MD; Q000111Q 11:05 AM) Tobacco use Current some day smoker. Caffeine use Carbonated beverages. Alcohol use Remotely quit alcohol use.  Family History Leighton Ruff, MD; Q000111Q 11:05 AM) Breast Cancer Brother, Family Members In General. Arthritis Mother. Alcohol Abuse Family Members In General, Father. Depression Mother. Colon Polyps Father. Colon Cancer Family Members In General. Heart disease in female family member before age 68 Heart Disease Family Members In General, Mother. Diabetes Mellitus Family Members In General, Father. Respiratory Condition Family Members In General, Mother. Migraine Headache Brother. Hypertension Family Members In General, Father, Mother.  Pregnancy / Birth History Leighton Ruff, MD; Q000111Q 11:05 AM) Age at menarche 27 years. Irregular periods Gravida 1 Contraceptive History Intrauterine device, Oral contraceptives. Para 1 Maternal age 36-30   Review of Systems  General Present- Fatigue. Not Present- Appetite Loss, Chills, Fever, Night Sweats, Weight Gain and Weight Loss. Skin Not Present- Change in Wart/Mole, Dryness, Hives, Jaundice, New Lesions, Non-Healing Wounds, Rash and Ulcer. HEENT Not Present- Earache, Hearing Loss, Hoarseness, Nose Bleed, Oral Ulcers, Ringing in the Ears, Seasonal Allergies, Sinus Pain, Sore Throat, Visual Disturbances, Wears glasses/contact lenses and Yellow Eyes. Respiratory Not Present- Bloody sputum, Chronic Cough, Difficulty Breathing, Snoring and Wheezing. Cardiovascular Present- Shortness of Breath. Not Present- Chest Pain, Difficulty Breathing Lying Down,  Leg Cramps, Palpitations, Rapid Heart Rate and Swelling of Extremities. Gastrointestinal Present- Bloody Stool. Not Present- Abdominal Pain, Bloating, Change in Bowel Habits, Chronic diarrhea, Constipation, Difficulty Swallowing, Excessive gas, Gets full quickly at meals, Hemorrhoids, Indigestion, Nausea, Rectal Pain and Vomiting. Musculoskeletal Present- Back Pain. Not Present- Joint Pain, Joint Stiffness, Muscle Pain, Muscle Weakness and Swelling of Extremities. Neurological Present- Headaches. Not Present-  Decreased Memory, Fainting, Numbness, Seizures, Tingling, Tremor, Trouble walking and Weakness. Endocrine Not Present- Cold Intolerance, Excessive Hunger, Hair Changes, Heat Intolerance, Hot flashes and New Diabetes.  BP 123/67   Pulse 78   Temp 98.5 F (36.9 C) (Oral)   Resp 16   Ht 5\' 5"  (1.651 m)   Wt 120.7 kg (266 lb)   SpO2 98%   BMI 44.26 kg/m     Physical Exam  General Mental Status-Alert. General Appearance-Consistent with stated age. Hydration-Well hydrated. Voice-Normal.  Head and Neck Head-normocephalic, atraumatic with no lesions or palpable masses. Trachea-midline.  Eye Eyeball - Bilateral-Extraocular movements intact. Sclera/Conjunctiva - Bilateral-No scleral icterus.  Abdomen Palpation/Percussion Palpation and Percussion of the abdomen reveal - Soft, Non Tender, No Rebound tenderness, No Rigidity (guarding) and No hepatosplenomegaly.  Rectal Anorectal Exam External - Note: Right anterior external lesion noted with ulceration. Internal - generalized tenderness.   Results Leighton Ruff MD; [id number] 11:07 AM) Procedures  Name Value Date ANOSCOPY, DIAGNOSTIC [id number]) [ Hemorrhoids ] Procedure Other: Procedure: Anoscopy Surgeon: Marcello Moores After the risks and benefits were explained, verbal consent was obtained for above procedure. A medical assistant chaperone was present thoroughout the entire procedure. Anesthesia: none Diagnosis:  History of AIN Findings: Right anterior external margin lesion noted, no suspicious internal lesions noted. Significant sphincter hypertension. No fissure noted. Tenderness to palpation noted.  Performed: 11/08/2015 11:06 AM    Assessment & Plan  ANAL INTRAEPITHELIAL NEOPLASIA III (D01.3) Impression: 45 year old female who underwent anal mapping approximately 1 year ago for AIN. She presents to the office with an enlarging external lesion. I think this will most likely be further AIN. I have recommended another exam under anesthesia with excision of this area and possible biopsy of any other suspicious lesions. She had significant trouble with urinary retention after the last surgery, so we will place a Foley upon discharge after the surgery.

## 2015-12-18 NOTE — Anesthesia Procedure Notes (Signed)
Procedure Name: MAC Date/Time: 12/18/2015 8:33 AM Performed by: Montez Hageman Pre-anesthesia Checklist: Patient identified, Timeout performed, Emergency Drugs available, Suction available and Patient being monitored Patient Re-evaluated:Patient Re-evaluated prior to inductionOxygen Delivery Method: Nasal cannula Placement Confirmation: positive ETCO2 and breath sounds checked- equal and bilateral

## 2015-12-18 NOTE — Discharge Instructions (Addendum)
Post Anesthesia Home Care Instructions  Activity: Get plenty of rest for the remainder of the day. A responsible adult should stay with you for 24 hours following the procedure.  For the next 24 hours, DO NOT: -Drive a car -Paediatric nurse -Drink alcoholic beverages -Take any medication unless instructed by your physician -Make any legal decisions or sign important papers.  Meals: Start with liquid foods such as gelatin or soup. Progress to regular foods as tolerated. Avoid greasy, spicy, heavy foods. If nausea and/or vomiting occur, drink only clear liquids until the nausea and/or vomiting subsides. Call your physician if vomiting continues.  Special Instructions/Symptoms: Your throat may feel dry or sore from the anesthesia or the breathing tube placed in your throat during surgery. If this causes discomfort, gargle with warm salt water. The discomfort should disappear within 24 hours.  If you had a scopolamine patch placed behind your ear for the management of post- operative nausea and/or vomiting:  1. The medication in the patch is effective for 72 hours, after which it should be removed.  Wrap patch in a tissue and discard in the trash. Wash hands thoroughly with soap and water. 2. You may remove the patch earlier than 72 hours if you experience unpleasant side effects which may include dry mouth, dizziness or visual disturbances. 3. Avoid touching the patch. Wash your hands with soap and water after contact with the patch.   ANORECTAL SURGERY: POST OP INSTRUCTIONS 1. Take your usually prescribed home medications unless otherwise directed. 2. DIET: During the first few hours after surgery sip on some liquids until you are able to urinate.  It is normal to not urinate for several hours after this surgery.  If you feel uncomfortable, please contact the office for instructions.  After you are able to urinate,you may eat, if you feel like it.  Follow a light bland diet the first 24  hours after arrival home, such as soup, liquids, crackers, etc.  Be sure to include lots of fluids daily (6-8 glasses).  Avoid fast food or heavy meals, as your are more likely to get nauseated.  Eat a low fat diet the next few days after surgery.  Limit caffeine intake to 1-2 servings a day. 3. PAIN CONTROL: a. Pain is best controlled by a usual combination of several different methods TOGETHER: i. Muscle relaxation 1.  Soak in a warm bath (or Sitz bath) three times a day and after bowel movements.  Continue to do this until all pain is resolved. ii. Over the counter pain medication iii. Prescription pain medication b. Most patients will experience some swelling and discomfort in the anus/rectal area and incisions.  Heat such as warm towels, sitz baths, warm baths, etc to help relax tight/sore spots and speed recovery.  Some people prefer to use ice, especially in the first couple days after surgery, as it may decrease the pain and swelling, or alternate between ice & heat.  Experiment to what works for you.  Swelling and bruising can take several weeks to resolve.  Pain can take even longer to completely resolve. c. It is helpful to take an over-the-counter pain medication regularly for the first few weeks.  Choose one of the following that works best for you: i. Naproxen (Aleve, etc)  Two 220mg  tabs twice a day ii. Ibuprofen (Advil, etc) Three 200mg  tabs four times a day (every meal & bedtime) d. A  prescription for pain medication (such as percocet, oxycodone, hydrocodone, etc) should be given to  you upon discharge.  Take your pain medication as prescribed.  i. If you are having problems/concerns with the prescription medicine (does not control pain, nausea, vomiting, rash, itching, etc), please call us [phone] to see if we need to switch you to a different pain medicine that will work better for you and/or control your side effect better. ii. If you need a refill on your pain medication,  please contact your pharmacy.  They will contact our office to request authorization. Prescriptions will not be filled after 5 pm or on week-ends. 4. KEEP YOUR BOWELS REGULAR and AVOID CONSTIPATION a. The goal is one to two soft bowel movements a day.  You should at least have a bowel movement every other day. b. Avoid getting constipated.  Between the surgery and the pain medications, it is common to experience some constipation. This can be very painful after rectal surgery.  Increasing fluid intake and taking a fiber supplement (such as Metamucil, Citrucel, FiberCon, etc) 1-2 times a day regularly will usually help prevent this problem from occurring.  A stool softener like colace is also recommended.  This can be purchased over the counter at your pharmacy.  You can take it up to 3 times a day.  If you do not have a bowel movement after 24 hrs since your surgery, take one does of milk of magnesia.  If you still haven't had a bowel movement 8-12 hours after that dose, take another dose.  If you don't have a bowel movement 48 hrs after surgery, purchase a Fleets enema from the drug store and administer gently per package instructions.  If you still are having trouble with your bowel movements after that, please call the office for further instructions. c. If you develop diarrhea or have many loose bowel movements, simplify your diet to bland foods & liquids for a few days.  Stop any stool softeners and decrease your fiber supplement.  Switching to mild anti-diarrheal medications (Kayopectate, Pepto Bismol) can help.  If this worsens or does not improve, please call us.  5. Wound Care a. Remove your bandages before your first bowel movement or 8 hours after surgery.     b. Remove any wound packing material at this tim,e as well.  You do not need to repack the wound unless instructed otherwise.  Wear an absorbent pad or soft cotton gauze in your underwear to catch any drainage and help keep the area clean.  You should change this every 2-3 hours while awake. c. Keep the area clean and dry.  Bathe / shower every day, especially after bowel movements.  Keep the area clean by showering / bathing over the incision / wound.   It is okay to soak an open wound to help wash it.  Wet wipes or showers / gentle washing after bowel movements is often less traumatic than regular toilet paper. d. Dennis Bast may have some styrofoam-like soft packing in the rectum which will come out with the first bowel movement.  e. You will often notice bleeding with bowel movements.  This should slow down by the end of the first week of surgery f. Expect some drainage.  This should slow down, too, by the end of the first week of surgery.  Wear an absorbent pad or soft cotton gauze in your underwear until the drainage stops. g. Do Not sit on a rubber or pillow ring.  This can make you symptoms worse.  You may sit on a soft pillow if needed.  6. ACTIVITIES as tolerated:   a. You may resume regular (light) daily activities beginning the next day--such as daily self-care, walking, climbing stairs--gradually increasing activities as tolerated.  If you can walk 30 minutes without difficulty, it is safe to try more intense activity such as jogging, treadmill, bicycling, low-impact aerobics, swimming, etc. b. Save the most intensive and strenuous activity for last such as sit-ups, heavy lifting, contact sports, etc  Refrain from any heavy lifting or straining until you are off narcotics for pain control.   c. You may drive when you are no longer taking prescription pain medication, you can comfortably sit for long periods of time, and you can safely maneuver your car and apply brakes. d. Dennis Bast may have sexual intercourse when it is comfortable.  7. FOLLOW UP in our office a. Please call [facility] at [phone] to set up an appointment to see your surgeon in the office for a follow-up appointment approximately 3-4 weeks after your surgery. b. Make sure  that you call for this appointment the day you arrive home to insure a convenient appointment time. 10. IF YOU HAVE DISABILITY OR FAMILY LEAVE FORMS, BRING THEM TO THE OFFICE FOR PROCESSING.  DO NOT GIVE THEM TO YOUR DOCTOR.  11.  Clean around foley daily.  Remove was catheter deflation of balloon when swelling resolves.  Call the office if you need to keep the catheter in for more than 7 days.     WHEN TO CALL us [phone]: 1. Poor pain control 2. Reactions / problems with new medications (rash/itching, nausea, etc)  3. Fever over 101.5 F (38.5 C) 4. Inability to urinate 5. Nausea and/or vomiting 6. Worsening swelling or bruising 7. Continued bleeding from incision. 8. Increased pain, redness, or drainage from the incision  The clinic staff is available to answer your questions during regular business hours (8:30am-5pm).  Please dont hesitate to call and ask to speak to one of our nurses for clinical concerns.   A surgeon from [facility] is always on call at the hospitals   If you have a medical emergency, go to the nearest emergency room or call 911.    [facility], [place], [place], [place], [place]  [address] ? MAIN: [phone] ? TOLL FREE: [phone] ? FAX [phone] [email or web address]

## 2015-12-19 ENCOUNTER — Encounter (HOSPITAL_BASED_OUTPATIENT_CLINIC_OR_DEPARTMENT_OTHER): Payer: Self-pay | Admitting: General Surgery

## 2015-12-20 NOTE — Anesthesia Postprocedure Evaluation (Signed)
Anesthesia Post Note  Patient: Denise Ray  Procedure(s) Performed: Procedure(s) (LRB): ANAL EXAM UNDER ANESTHESIA  EXCISION ANAL MARGIN LESION (N/A)  Patient location during evaluation: PACU Anesthesia Type: MAC Level of consciousness: awake and alert Pain management: pain level controlled Vital Signs Assessment: post-procedure vital signs reviewed and stable Respiratory status: spontaneous breathing, nonlabored ventilation, respiratory function stable and patient connected to nasal cannula oxygen Cardiovascular status: stable and blood pressure returned to baseline Anesthetic complications: no    Last Vitals:  Vitals:   12/18/15 1015 12/18/15 1130  BP: (!) 115/48 (!) 121/50  Pulse: 82 76  Resp: (!) 22 20  Temp:  36.8 C    Last Pain:  Vitals:   12/19/15 1351  TempSrc:   PainSc: 7                  Montez Hageman

## 2015-12-24 DIAGNOSIS — C210 Malignant neoplasm of anus, unspecified: Secondary | ICD-10-CM | POA: Diagnosis not present

## 2015-12-24 DIAGNOSIS — J449 Chronic obstructive pulmonary disease, unspecified: Secondary | ICD-10-CM | POA: Diagnosis not present

## 2015-12-24 DIAGNOSIS — R339 Retention of urine, unspecified: Secondary | ICD-10-CM | POA: Diagnosis not present

## 2015-12-24 DIAGNOSIS — R609 Edema, unspecified: Secondary | ICD-10-CM | POA: Diagnosis not present

## 2015-12-30 MED FILL — HYDROCODON-APAP 10-325: 10-325 | 30 days supply | Qty: 120 | Fill #0

## 2016-01-27 ENCOUNTER — Other Ambulatory Visit (HOSPITAL_COMMUNITY)
Admission: RE | Admit: 2016-01-27 | Discharge: 2016-01-27 | Disposition: A | Discharge status: Home / Self Care | Payer: 59 | Source: Ambulatory Visit | Attending: Pulmonary Disease | Admitting: Pulmonary Disease

## 2016-01-27 DIAGNOSIS — J019 Acute sinusitis, unspecified: Secondary | ICD-10-CM | POA: Diagnosis not present

## 2016-01-27 DIAGNOSIS — R739 Hyperglycemia, unspecified: Secondary | ICD-10-CM | POA: Insufficient documentation

## 2016-01-27 DIAGNOSIS — J449 Chronic obstructive pulmonary disease, unspecified: Secondary | ICD-10-CM | POA: Diagnosis not present

## 2016-01-27 DIAGNOSIS — C210 Malignant neoplasm of anus, unspecified: Secondary | ICD-10-CM | POA: Diagnosis not present

## 2016-01-27 MED FILL — HYDROCODON-APAP 10-325: 10-325 | 30 days supply | Qty: 120 | Fill #0

## 2016-01-27 MED FILL — levoFLOXacin 500 MG TABS: 500 | 10 days supply | Qty: 10 | Fill #0

## 2016-01-27 MED FILL — FUROSEMIDE 40 MG TABLET: 40 | 90 days supply | Qty: 90 | Fill #0

## 2016-01-28 LAB — HEMOGLOBIN A1C
Hgb A1c MFr Bld: 6.8 % — ABNORMAL HIGH (ref 4.8–5.6)
Mean Plasma Glucose: 148 mg/dL

## 2016-02-24 MED FILL — PANTOPRAZOLE SOD DR 40 MG T: 40 | 90 days supply | Qty: 90 | Fill #3

## 2016-02-24 MED FILL — ALPRAZolam 1 MG TABS: 1 | 90 days supply | Qty: 180 | Fill #1

## 2016-02-25 MED FILL — HYDROCODON-APAP 10-325: 10-325 | 30 days supply | Qty: 120 | Fill #0

## 2016-03-25 DIAGNOSIS — H5203 Hypermetropia, bilateral: Secondary | ICD-10-CM | POA: Diagnosis not present

## 2016-03-25 DIAGNOSIS — H524 Presbyopia: Secondary | ICD-10-CM | POA: Diagnosis not present

## 2016-03-25 DIAGNOSIS — H52223 Regular astigmatism, bilateral: Secondary | ICD-10-CM | POA: Diagnosis not present

## 2016-03-25 DIAGNOSIS — H35033 Hypertensive retinopathy, bilateral: Secondary | ICD-10-CM | POA: Diagnosis not present

## 2016-03-27 MED FILL — HYDROCODON-APAP 10-325: 10-325 | 30 days supply | Qty: 120 | Fill #0

## 2016-04-24 MED FILL — HYDROCODON-APAP 10-325: 10-325 | 30 days supply | Qty: 120 | Fill #0

## 2016-05-06 DIAGNOSIS — G4733 Obstructive sleep apnea (adult) (pediatric): Secondary | ICD-10-CM | POA: Diagnosis not present

## 2016-05-06 DIAGNOSIS — C210 Malignant neoplasm of anus, unspecified: Secondary | ICD-10-CM | POA: Diagnosis not present

## 2016-05-06 DIAGNOSIS — F419 Anxiety disorder, unspecified: Secondary | ICD-10-CM | POA: Diagnosis not present

## 2016-05-06 DIAGNOSIS — J449 Chronic obstructive pulmonary disease, unspecified: Secondary | ICD-10-CM | POA: Diagnosis not present

## 2016-05-26 MED FILL — predniSONE 10 MG TABS: 10 | 12 days supply | Qty: 30 | Fill #0

## 2016-05-26 MED FILL — HYDROCODON-APAP 10-325: 10-325 | 30 days supply | Qty: 120 | Fill #0

## 2016-05-26 MED FILL — AZITHROMYCIN 250 MG TABLET: 250 | 5 days supply | Qty: 6 | Fill #0

## 2016-05-26 MED FILL — PANTOPRAZOLE SOD DR 40 MG T: 40 | 90 days supply | Qty: 90 | Fill #0

## 2016-05-26 MED FILL — ALBUTEROL 0.083% INHAL SOLN: (2.5 MG/3ML | 15 days supply | Qty: 180 | Fill #1

## 2016-05-26 MED FILL — ALPRAZolam 1 MG TABS: 1 | 90 days supply | Qty: 180 | Fill #0

## 2016-06-29 MED FILL — HYDROCODON-APAP 10-325: 10-325 | 30 days supply | Qty: 120 | Fill #0

## 2016-06-29 MED FILL — AZITHROMYCIN 250 MG TABLET: 250 | 5 days supply | Qty: 6 | Fill #1

## 2016-07-07 ENCOUNTER — Other Ambulatory Visit (HOSPITAL_COMMUNITY): Payer: Self-pay | Admitting: Pulmonary Disease

## 2016-07-07 DIAGNOSIS — Z1231 Encounter for screening mammogram for malignant neoplasm of breast: Secondary | ICD-10-CM

## 2016-08-03 ENCOUNTER — Ambulatory Visit (HOSPITAL_COMMUNITY)
Admission: RE | Admit: 2016-08-03 | Discharge: 2016-08-03 | Disposition: A | Discharge status: Home / Self Care | Payer: 59 | Source: Ambulatory Visit | Attending: Pulmonary Disease | Admitting: Pulmonary Disease

## 2016-08-03 DIAGNOSIS — A630 Anogenital (venereal) warts: Secondary | ICD-10-CM | POA: Diagnosis not present

## 2016-08-03 DIAGNOSIS — Z1231 Encounter for screening mammogram for malignant neoplasm of breast: Secondary | ICD-10-CM | POA: Diagnosis not present

## 2016-08-03 DIAGNOSIS — N6489 Other specified disorders of breast: Secondary | ICD-10-CM | POA: Insufficient documentation

## 2016-08-03 IMAGING — MG 2D DIGITAL SCREENING BILATERAL MAMMOGRAM WITH CAD AND ADJUNCT TO
6 of 9 series · 6 of 25 positions shown · non-contrast
Comparison: Previous exam(s).

CLINICAL DATA: Screening.

EXAM:
2D DIGITAL SCREENING BILATERAL MAMMOGRAM WITH CAD AND ADJUNCT TOMO

[R XCCL]
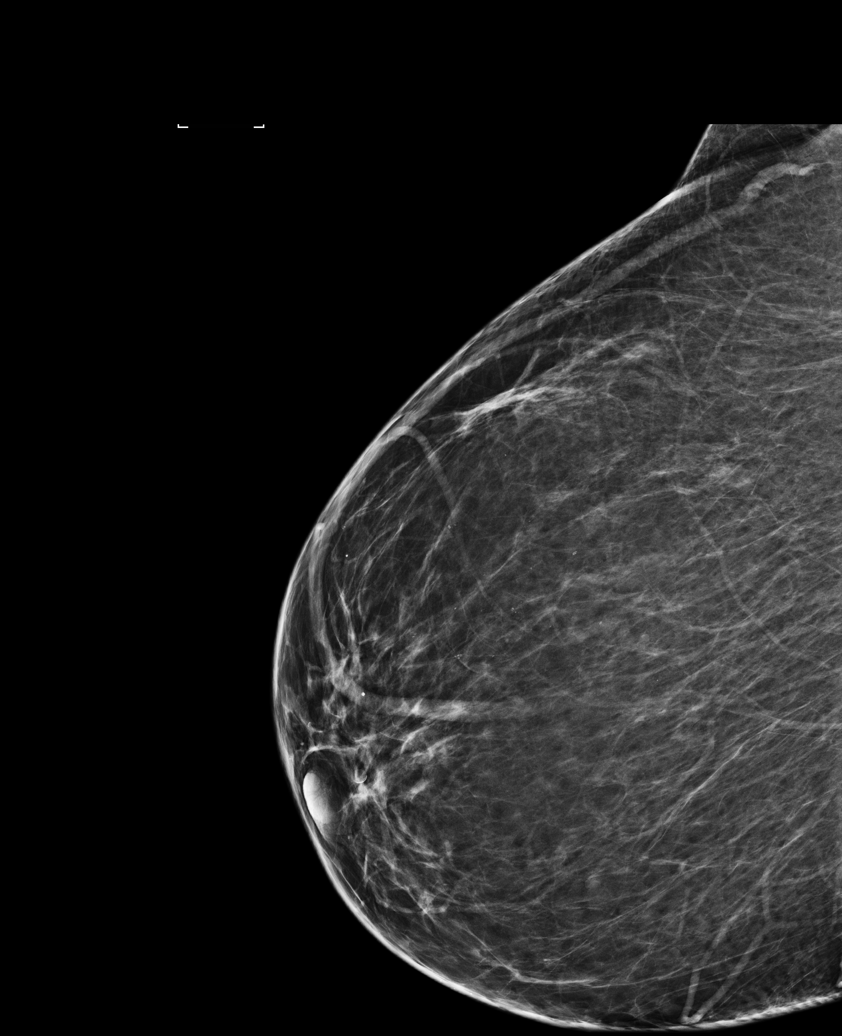

[R MLO]
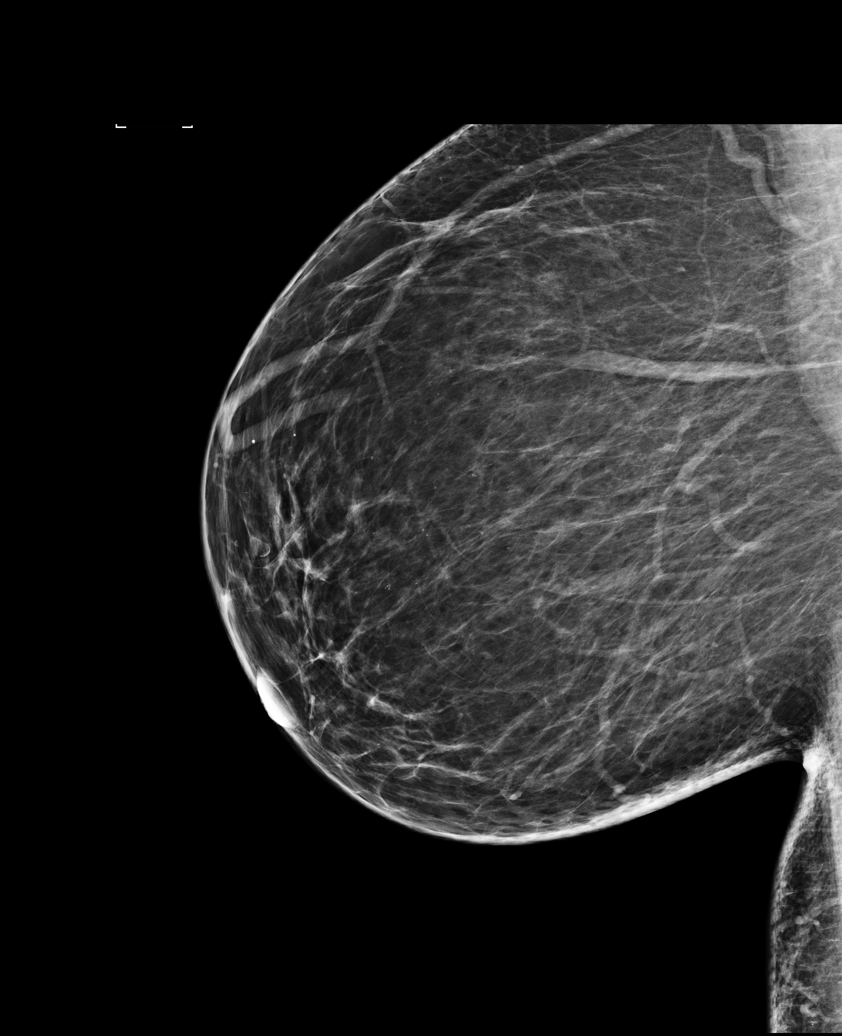

[L MLO]
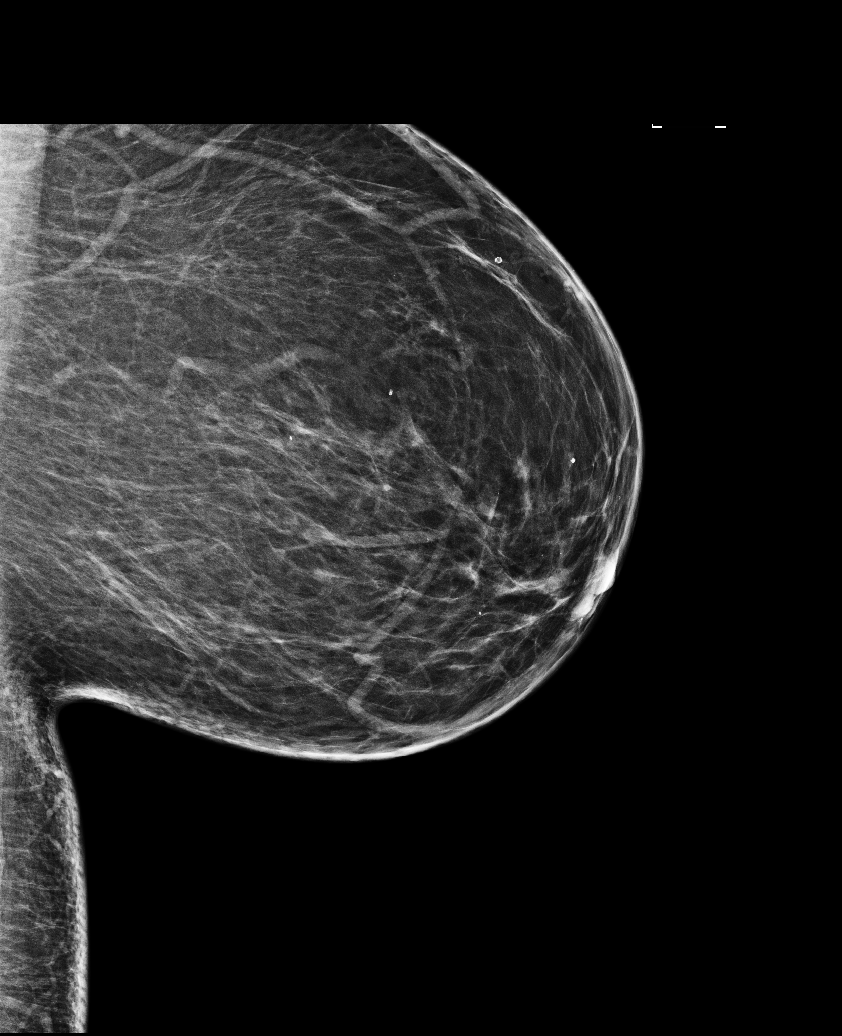

[R CC]
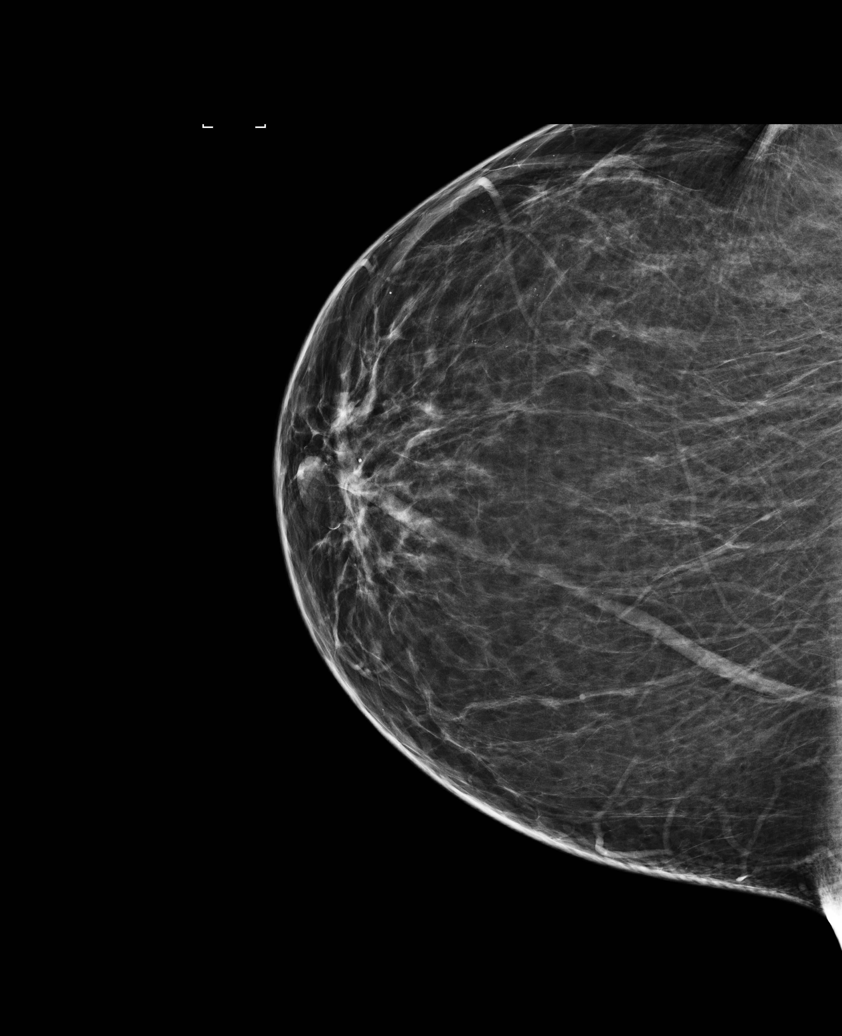

[L CC]
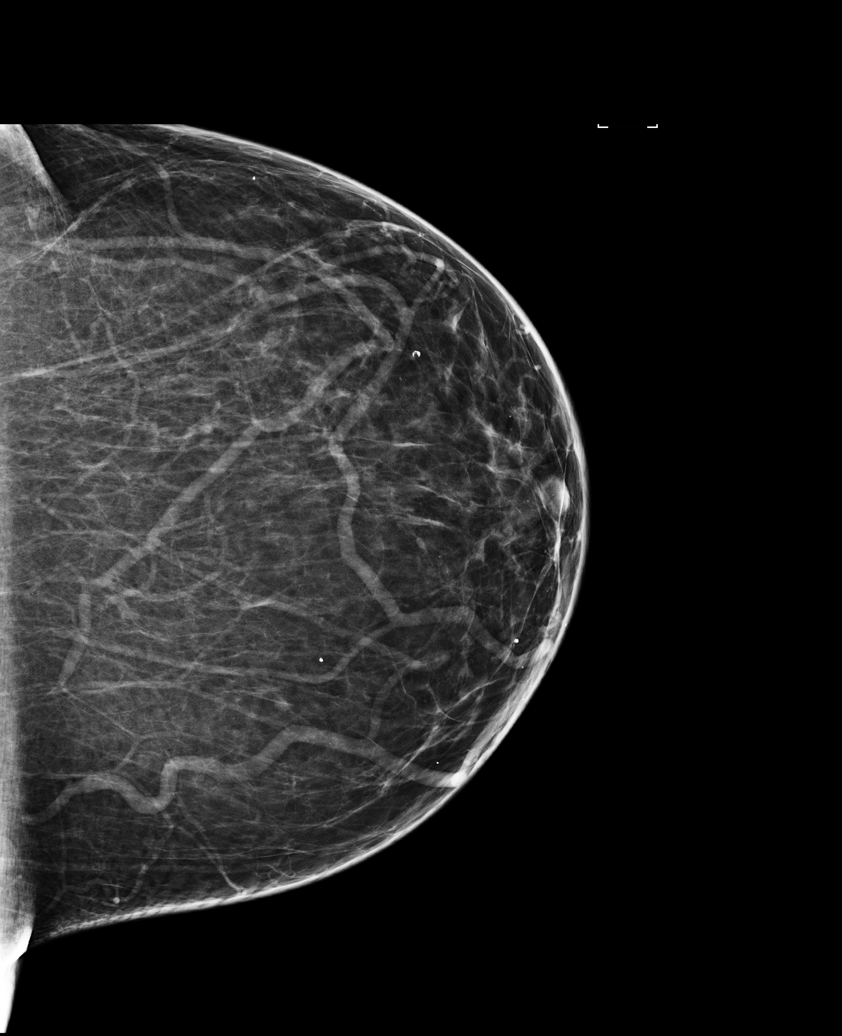

[R CC tomo · tomo slice 39/76.0]
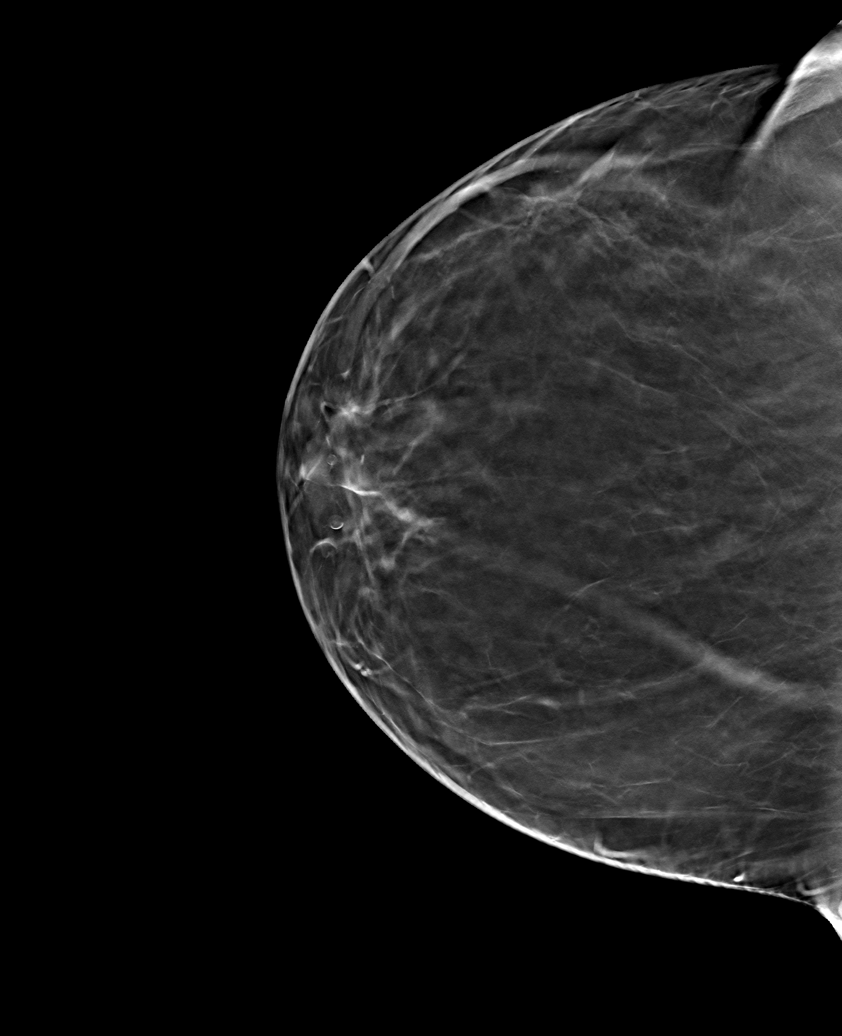

[6 of 25 positions shown; findings below may reference images not displayed]

ACR Breast Density Category b: There are scattered areas of
fibroglandular density.
FINDINGS: There are no findings suspicious for malignancy. Images were
processed with CAD.
IMPRESSION: No mammographic evidence of malignancy. A result letter of this
screening mammogram will be mailed directly to the patient.

RECOMMENDATION:
Screening mammogram in one year. (Code:[33])

BI-RADS CATEGORY  1: Negative.

## 2016-08-03 MED FILL — IMIQUIMOD 5% CREAM PACKET: 5 | 84 days supply | Qty: 36 | Fill #0

## 2016-08-04 MED FILL — levoFLOXacin 500 MG TABS: 500 | 10 days supply | Qty: 10 | Fill #0

## 2016-08-04 MED FILL — predniSONE 10 MG TABS: 10 | 12 days supply | Qty: 30 | Fill #0

## 2016-08-04 MED FILL — HYDROCODON-APAP 10-325: 10-325 | 30 days supply | Qty: 120 | Fill #0

## 2016-08-31 ENCOUNTER — Encounter (HOSPITAL_COMMUNITY): Payer: Self-pay

## 2016-08-31 ENCOUNTER — Inpatient Hospital Stay (HOSPITAL_COMMUNITY)
Admission: AD | Admit: 2016-08-31 | Discharge: 2016-09-11 | DRG: 193 | Disposition: A | Discharge status: Home / Self Care | Payer: 59 | Source: Ambulatory Visit | Attending: Pulmonary Disease | Admitting: Pulmonary Disease

## 2016-08-31 ENCOUNTER — Inpatient Hospital Stay (HOSPITAL_COMMUNITY): Payer: 59

## 2016-08-31 DIAGNOSIS — J441 Chronic obstructive pulmonary disease with (acute) exacerbation: Secondary | ICD-10-CM | POA: Diagnosis not present

## 2016-08-31 DIAGNOSIS — Z79891 Long term (current) use of opiate analgesic: Secondary | ICD-10-CM

## 2016-08-31 DIAGNOSIS — E1165 Type 2 diabetes mellitus with hyperglycemia: Secondary | ICD-10-CM | POA: Diagnosis not present

## 2016-08-31 DIAGNOSIS — Z87891 Personal history of nicotine dependence: Secondary | ICD-10-CM

## 2016-08-31 DIAGNOSIS — Z888 Allergy status to other drugs, medicaments and biological substances status: Secondary | ICD-10-CM

## 2016-08-31 DIAGNOSIS — E669 Obesity, unspecified: Secondary | ICD-10-CM | POA: Diagnosis present

## 2016-08-31 DIAGNOSIS — M1990 Unspecified osteoarthritis, unspecified site: Secondary | ICD-10-CM | POA: Diagnosis present

## 2016-08-31 DIAGNOSIS — R609 Edema, unspecified: Secondary | ICD-10-CM | POA: Diagnosis not present

## 2016-08-31 DIAGNOSIS — G8929 Other chronic pain: Secondary | ICD-10-CM | POA: Diagnosis present

## 2016-08-31 DIAGNOSIS — K219 Gastro-esophageal reflux disease without esophagitis: Secondary | ICD-10-CM | POA: Diagnosis present

## 2016-08-31 DIAGNOSIS — Z886 Allergy status to analgesic agent status: Secondary | ICD-10-CM | POA: Diagnosis not present

## 2016-08-31 DIAGNOSIS — J189 Pneumonia, unspecified organism: Principal | ICD-10-CM | POA: Diagnosis present

## 2016-08-31 DIAGNOSIS — J440 Chronic obstructive pulmonary disease with acute lower respiratory infection: Secondary | ICD-10-CM | POA: Diagnosis present

## 2016-08-31 DIAGNOSIS — Z8673 Personal history of transient ischemic attack (TIA), and cerebral infarction without residual deficits: Secondary | ICD-10-CM | POA: Diagnosis not present

## 2016-08-31 DIAGNOSIS — R0602 Shortness of breath: Secondary | ICD-10-CM

## 2016-08-31 DIAGNOSIS — Z6841 Body Mass Index (BMI) 40.0 and over, adult: Secondary | ICD-10-CM | POA: Diagnosis not present

## 2016-08-31 DIAGNOSIS — Z885 Allergy status to narcotic agent status: Secondary | ICD-10-CM

## 2016-08-31 DIAGNOSIS — T380X5A Adverse effect of glucocorticoids and synthetic analogues, initial encounter: Secondary | ICD-10-CM | POA: Diagnosis not present

## 2016-08-31 DIAGNOSIS — G4733 Obstructive sleep apnea (adult) (pediatric): Secondary | ICD-10-CM | POA: Diagnosis present

## 2016-08-31 DIAGNOSIS — C20 Malignant neoplasm of rectum: Secondary | ICD-10-CM | POA: Diagnosis present

## 2016-08-31 DIAGNOSIS — J984 Other disorders of lung: Secondary | ICD-10-CM | POA: Diagnosis present

## 2016-08-31 DIAGNOSIS — E876 Hypokalemia: Secondary | ICD-10-CM | POA: Diagnosis present

## 2016-08-31 DIAGNOSIS — R0600 Dyspnea, unspecified: Secondary | ICD-10-CM | POA: Diagnosis not present

## 2016-08-31 DIAGNOSIS — J9621 Acute and chronic respiratory failure with hypoxia: Secondary | ICD-10-CM | POA: Diagnosis present

## 2016-08-31 DIAGNOSIS — Z79899 Other long term (current) drug therapy: Secondary | ICD-10-CM | POA: Diagnosis not present

## 2016-08-31 DIAGNOSIS — R062 Wheezing: Secondary | ICD-10-CM | POA: Diagnosis not present

## 2016-08-31 DIAGNOSIS — I517 Cardiomegaly: Secondary | ICD-10-CM | POA: Diagnosis present

## 2016-08-31 DIAGNOSIS — B370 Candidal stomatitis: Secondary | ICD-10-CM | POA: Diagnosis not present

## 2016-08-31 DIAGNOSIS — I472 Ventricular tachycardia: Secondary | ICD-10-CM | POA: Diagnosis not present

## 2016-08-31 DIAGNOSIS — E119 Type 2 diabetes mellitus without complications: Secondary | ICD-10-CM

## 2016-08-31 DIAGNOSIS — Z8709 Personal history of other diseases of the respiratory system: Secondary | ICD-10-CM | POA: Diagnosis present

## 2016-08-31 DIAGNOSIS — Z9989 Dependence on other enabling machines and devices: Secondary | ICD-10-CM

## 2016-08-31 LAB — CBC WITH DIFFERENTIAL/PLATELET
BASOS ABS: 0 10*3/uL (ref 0.0–0.1)
Basophils Relative: 0 %
EOS ABS: 0.2 10*3/uL (ref 0.0–0.7)
Eosinophils Relative: 2 %
HCT: 41.6 % (ref 36.0–46.0)
Hemoglobin: 14.2 g/dL (ref 12.0–15.0)
LYMPHS PCT: 24 %
Lymphs Abs: 1.9 10*3/uL (ref 0.7–4.0)
MCH: 29.5 pg (ref 26.0–34.0)
MCHC: 34.1 g/dL (ref 30.0–36.0)
MCV: 86.5 fL (ref 78.0–100.0)
MONO ABS: 0.6 10*3/uL (ref 0.1–1.0)
Monocytes Relative: 8 %
Neutro Abs: 5.4 10*3/uL (ref 1.7–7.7)
Neutrophils Relative %: 66 %
Platelets: 282 10*3/uL (ref 150–400)
RBC: 4.81 MIL/uL (ref 3.87–5.11)
RDW: 13.9 % (ref 11.5–15.5)
WBC: 8.1 10*3/uL (ref 4.0–10.5)

## 2016-08-31 LAB — COMPREHENSIVE METABOLIC PANEL
ALBUMIN: 3.8 g/dL (ref 3.5–5.0)
ALK PHOS: 58 U/L (ref 38–126)
ALT: 16 U/L (ref 14–54)
AST: 20 U/L (ref 15–41)
Anion gap: 14 (ref 5–15)
BILIRUBIN TOTAL: 0.5 mg/dL (ref 0.3–1.2)
BUN: 11 mg/dL (ref 6–20)
CALCIUM: 8.7 mg/dL — AB (ref 8.9–10.3)
CO2: 21 mmol/L — ABNORMAL LOW (ref 22–32)
CREATININE: 0.62 mg/dL (ref 0.44–1.00)
Chloride: 101 mmol/L (ref 101–111)
GFR calc Af Amer: >60 mL/min (ref >60)
GLUCOSE: 100 mg/dL — AB (ref 65–99)
POTASSIUM: 3.4 mmol/L — AB (ref 3.5–5.1)
Sodium: 136 mmol/L (ref 135–145)
TOTAL PROTEIN: 7.5 g/dL (ref 6.5–8.1)

## 2016-08-31 LAB — MRSA PCR SCREENING: MRSA BY PCR: NEGATIVE

## 2016-08-31 IMAGING — DX DG CHEST 2V
2 series · 2 of 2 positions shown · non-contrast
Comparison: Chest radiograph performed [DATE]

CLINICAL DATA: Acute onset of wheezing.  Initial encounter.

EXAM:
CHEST  2 VIEW

[chest pa]
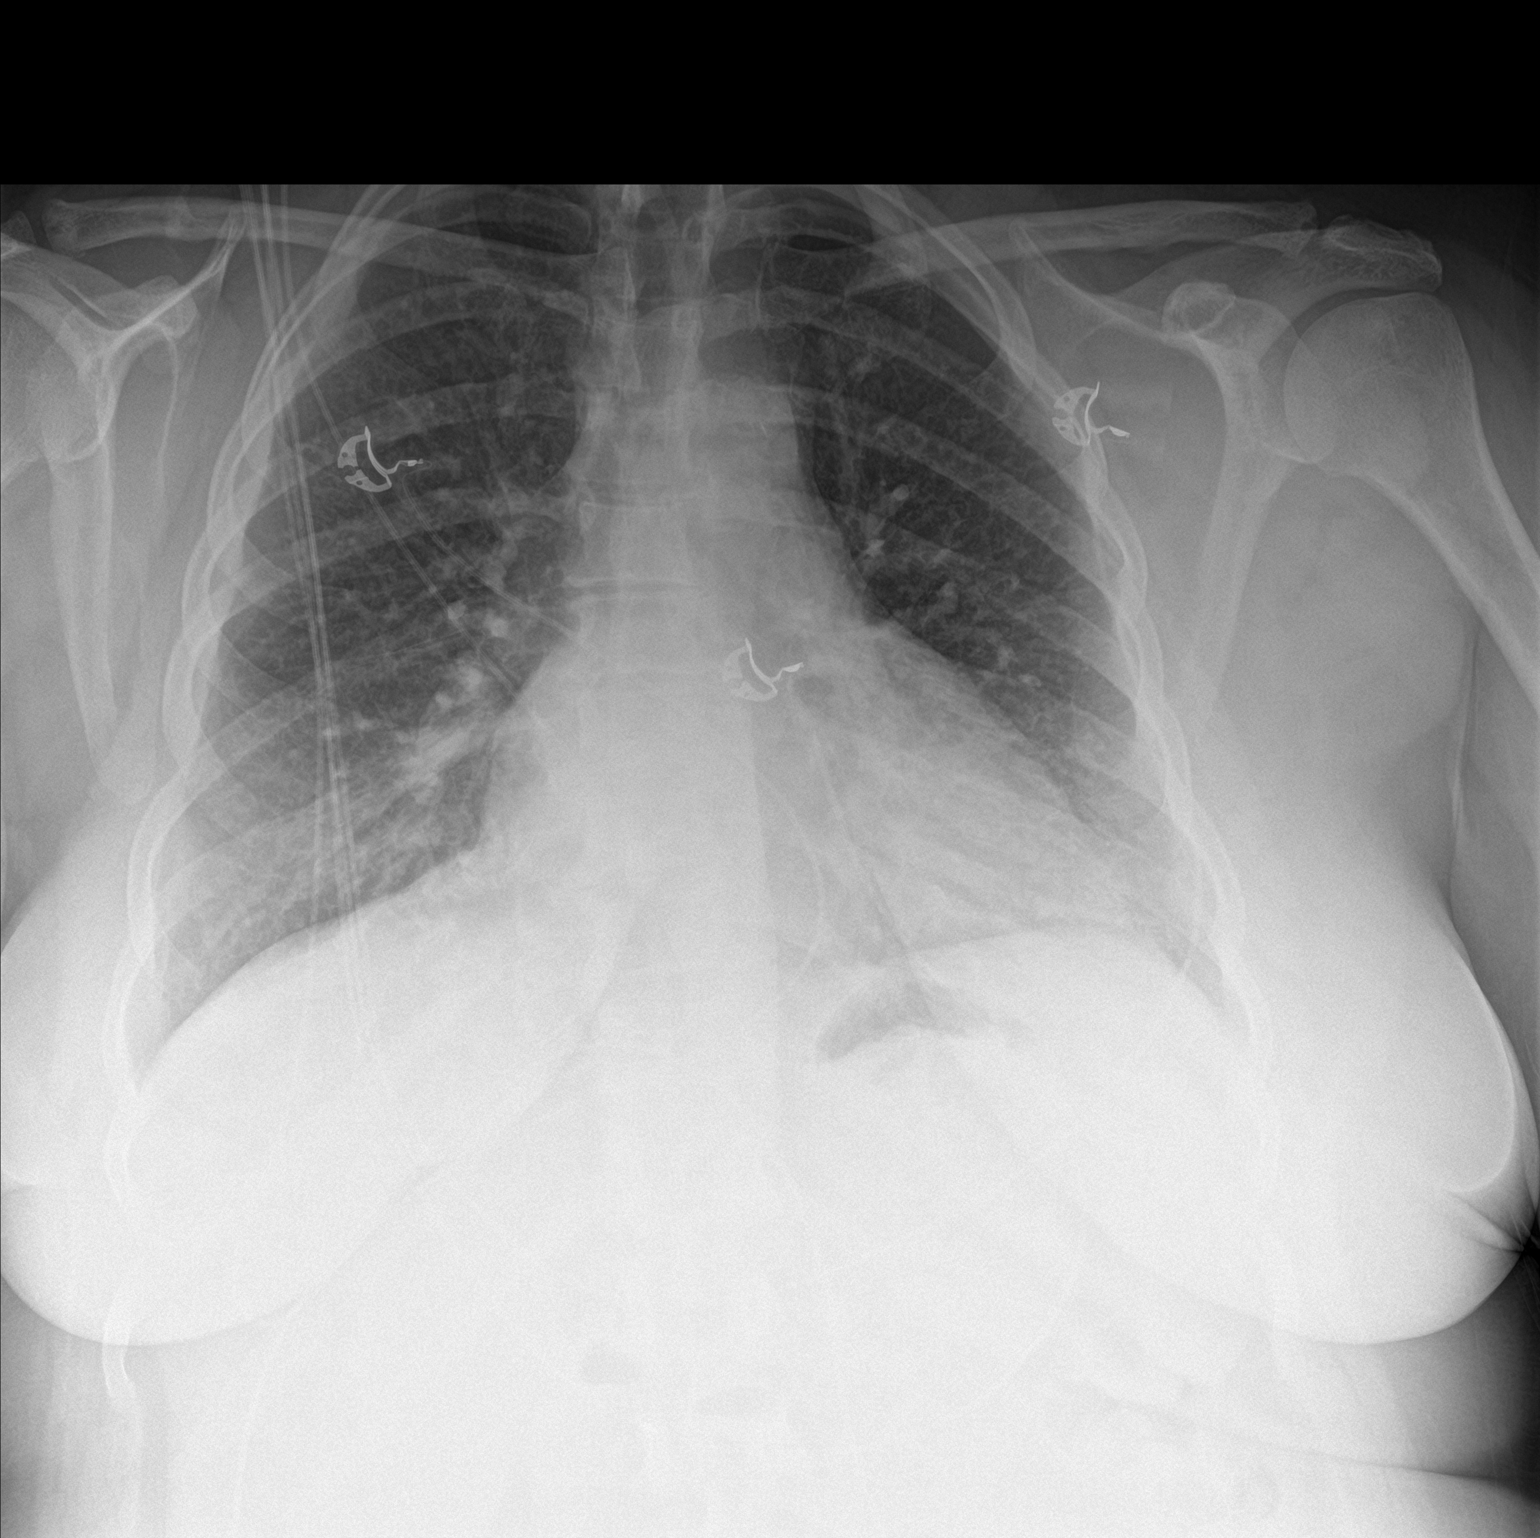

[chest lat]
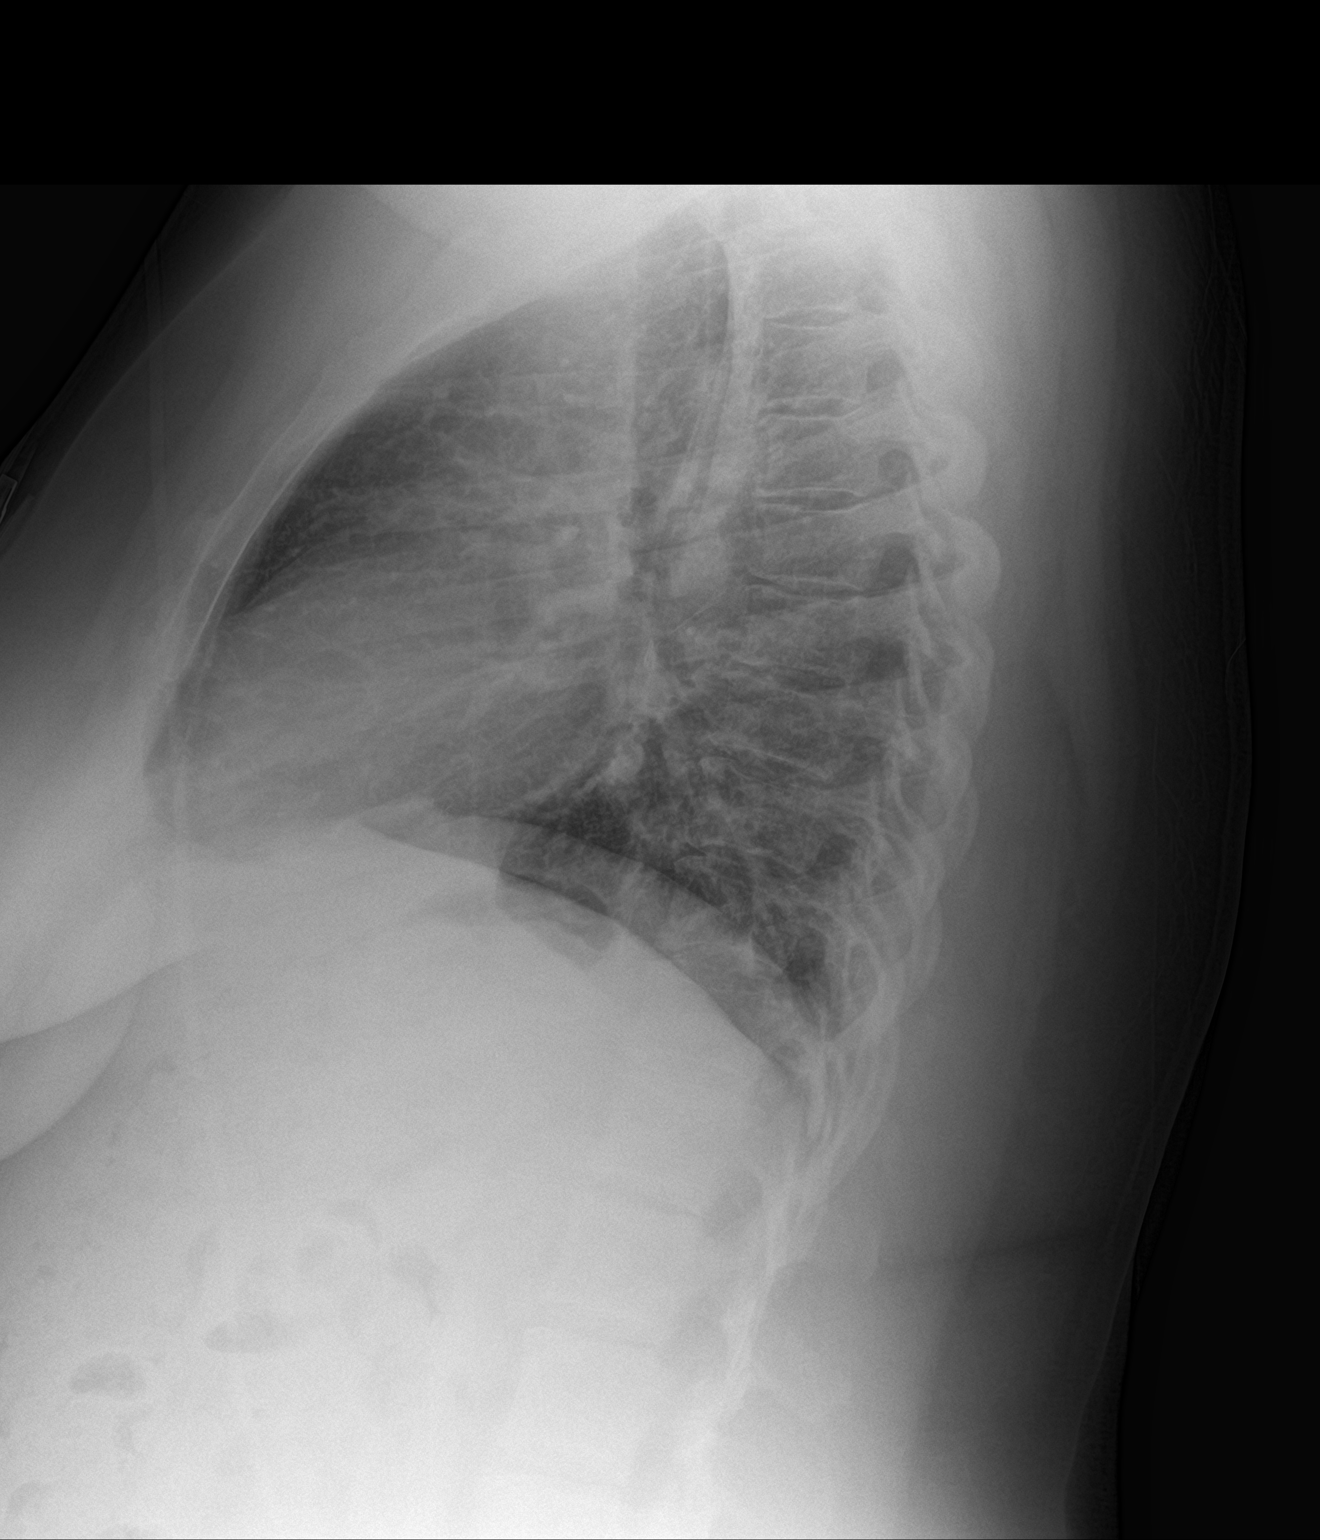

[2 of 2 positions shown; findings below may reference images not displayed]

FINDINGS: The lungs are well-aerated. Mild vascular congestion is noted. Mild
bibasilar opacities may reflect mild interstitial edema. There is no
evidence of pleural effusion or pneumothorax.

The heart is borderline normal in size. No acute osseous
abnormalities are seen.
IMPRESSION: Mild vascular congestion.  Question of mild interstitial edema.

## 2016-08-31 MED ORDER — HYDROCODONE-ACETAMINOPHEN 5-325 MG PO TABS
1.0000 | ORAL_TABLET | Freq: Four times a day (QID) | ORAL | Status: DC | PRN
  Administered 2016-08-31 – 2016-09-01 (×2): 1 via ORAL
  Administered 2016-09-01: 2 via ORAL
  Administered 2016-09-01: 1 via ORAL
  Administered 2016-09-02 – 2016-09-05 (×9): 2 via ORAL
  Administered 2016-09-05 – 2016-09-07 (×5): 1 via ORAL
  Administered 2016-09-08 (×2): 2 via ORAL
  Administered 2016-09-08: 1 via ORAL
  Administered 2016-09-08 – 2016-09-10 (×6): 2 via ORAL
  Filled 2016-08-31: qty 1
  Filled 2016-08-31: qty 2
  Filled 2016-08-31 (×2): qty 1
  Filled 2016-08-31 (×2): qty 2
  Filled 2016-08-31: qty 1
  Filled 2016-08-31 (×9): qty 2
  Filled 2016-08-31: qty 1
  Filled 2016-08-31: qty 2
  Filled 2016-08-31: qty 1
  Filled 2016-08-31: qty 2
  Filled 2016-08-31 (×2): qty 1
  Filled 2016-08-31: qty 2
  Filled 2016-08-31: qty 1
  Filled 2016-08-31 (×2): qty 2
  Filled 2016-08-31 (×2): qty 1
  Filled 2016-08-31: qty 2

## 2016-08-31 MED ORDER — DEXTROSE 5 % IV SOLN
500.0000 mg | INTRAVENOUS | Status: DC
  Administered 2016-08-31 – 2016-09-02 (×3): 500 mg via INTRAVENOUS
  Filled 2016-08-31 (×5): qty 500

## 2016-08-31 MED ORDER — ACETAMINOPHEN 325 MG PO TABS: 650.0000 mg | ORAL_TABLET | Freq: Four times a day (QID) | ORAL | Status: DC | PRN

## 2016-08-31 MED ORDER — POLYETHYLENE GLYCOL 3350 17 G PO PACK: 17.0000 g | PACK | Freq: Every day | ORAL | Status: DC | PRN

## 2016-08-31 MED ORDER — GUAIFENESIN ER 600 MG PO TB12
600.0000 mg | ORAL_TABLET | Freq: Two times a day (BID) | ORAL | Status: DC
  Administered 2016-08-31 – 2016-09-10 (×22): 600 mg via ORAL
  Filled 2016-08-31 (×22): qty 1

## 2016-08-31 MED ORDER — IPRATROPIUM-ALBUTEROL 0.5-2.5 (3) MG/3ML IN SOLN
3.0000 mL | Freq: Three times a day (TID) | RESPIRATORY_TRACT | Status: DC
  Administered 2016-08-31 – 2016-09-05 (×11): 3 mL via RESPIRATORY_TRACT
  Filled 2016-08-31 (×13): qty 3

## 2016-08-31 MED ORDER — ALBUTEROL SULFATE HFA 108 (90 BASE) MCG/ACT IN AERS: 2.0000 | INHALATION_SPRAY | Freq: Four times a day (QID) | RESPIRATORY_TRACT | Status: DC

## 2016-08-31 MED ORDER — ALBUTEROL SULFATE (2.5 MG/3ML) 0.083% IN NEBU
2.5000 mg | INHALATION_SOLUTION | Freq: Four times a day (QID) | RESPIRATORY_TRACT | Status: DC
  Administered 2016-08-31: 2.5 mg via RESPIRATORY_TRACT
  Filled 2016-08-31: qty 3

## 2016-08-31 MED ORDER — ALBUTEROL SULFATE (2.5 MG/3ML) 0.083% IN NEBU
2.5000 mg | INHALATION_SOLUTION | RESPIRATORY_TRACT | Status: DC | PRN
  Administered 2016-09-01 – 2016-09-06 (×10): 2.5 mg via RESPIRATORY_TRACT
  Filled 2016-08-31 (×8): qty 3

## 2016-08-31 MED ORDER — METHYLPREDNISOLONE SODIUM SUCC 40 MG IJ SOLR
40.0000 mg | Freq: Two times a day (BID) | INTRAMUSCULAR | Status: DC
  Administered 2016-08-31 – 2016-09-07 (×14): 40 mg via INTRAVENOUS
  Filled 2016-08-31 (×14): qty 1

## 2016-08-31 MED ORDER — DEXTROSE 5 % IV SOLN
1.0000 g | INTRAVENOUS | Status: DC
  Administered 2016-08-31 – 2016-09-09 (×10): 1 g via INTRAVENOUS
  Filled 2016-08-31 (×11): qty 10

## 2016-08-31 MED ORDER — POTASSIUM CHLORIDE CRYS ER 20 MEQ PO TBCR
20.0000 meq | EXTENDED_RELEASE_TABLET | Freq: Two times a day (BID) | ORAL | Status: DC
  Administered 2016-08-31 – 2016-09-07 (×15): 20 meq via ORAL
  Filled 2016-08-31 (×4): qty 1
  Filled 2016-08-31: qty 2
  Filled 2016-08-31 (×10): qty 1

## 2016-08-31 MED ORDER — IPRATROPIUM BROMIDE 0.02 % IN SOLN
0.5000 mg | Freq: Four times a day (QID) | RESPIRATORY_TRACT | Status: DC
  Administered 2016-08-31: 0.5 mg via RESPIRATORY_TRACT
  Filled 2016-08-31: qty 2.5

## 2016-08-31 MED ORDER — SODIUM CHLORIDE 0.9% FLUSH
3.0000 mL | Freq: Two times a day (BID) | INTRAVENOUS | Status: DC
  Administered 2016-08-31 – 2016-09-08 (×12): 3 mL via INTRAVENOUS
  Administered 2016-09-08: 10 mL via INTRAVENOUS
  Administered 2016-09-09 – 2016-09-10 (×3): 3 mL via INTRAVENOUS

## 2016-08-31 MED ORDER — ACETAMINOPHEN 650 MG RE SUPP: 650.0000 mg | Freq: Four times a day (QID) | RECTAL | Status: DC | PRN

## 2016-08-31 MED ORDER — ALPRAZOLAM 1 MG PO TABS
1.0000 mg | ORAL_TABLET | Freq: Two times a day (BID) | ORAL | Status: DC
  Administered 2016-08-31: 0.5 mg via ORAL
  Administered 2016-08-31 – 2016-09-10 (×21): 1 mg via ORAL
  Filled 2016-08-31 (×22): qty 1

## 2016-08-31 MED ORDER — PANTOPRAZOLE SODIUM 40 MG PO TBEC
40.0000 mg | DELAYED_RELEASE_TABLET | Freq: Every morning | ORAL | Status: DC
  Administered 2016-08-31 – 2016-09-10 (×11): 40 mg via ORAL
  Filled 2016-08-31 (×11): qty 1

## 2016-08-31 MED ORDER — ONDANSETRON HCL 4 MG/2ML IJ SOLN
4.0000 mg | Freq: Four times a day (QID) | INTRAMUSCULAR | Status: DC | PRN
  Administered 2016-09-10: 4 mg via INTRAVENOUS
  Filled 2016-08-31: qty 2

## 2016-08-31 MED ORDER — SODIUM CHLORIDE 0.9 % IV SOLN
INTRAVENOUS | Status: DC
  Administered 2016-08-31 – 2016-09-04 (×3): via INTRAVENOUS

## 2016-08-31 MED ORDER — ENOXAPARIN SODIUM 60 MG/0.6ML ~~LOC~~ SOLN
60.0000 mg | SUBCUTANEOUS | Status: DC
  Administered 2016-09-09: 19:00:00 60 mg via SUBCUTANEOUS
  Filled 2016-08-31 (×7): qty 0.6

## 2016-08-31 MED ORDER — ONDANSETRON HCL 4 MG PO TABS: 4.0000 mg | ORAL_TABLET | Freq: Four times a day (QID) | ORAL | Status: DC | PRN

## 2016-09-01 ENCOUNTER — Encounter (HOSPITAL_COMMUNITY): Payer: Self-pay

## 2016-09-01 LAB — HIV ANTIBODY (ROUTINE TESTING W REFLEX): HIV Screen 4th Generation wRfx: NONREACTIVE

## 2016-09-01 NOTE — Care Management Note (Signed)
Case Management Note  Patient Details  Name: Denise Ray MRN: [id number] Date of Birth: May 18, 1971  Subjective/Objective:                  Pt admitted with COPD. She is from home, ind with ADL's. She has PCP, transportation and no difficulty affording medications. She has neb and CPAP. She has no HH or DME needs. No needs communicated.  Action/Plan: Plan for return home with self care.   Expected Discharge Date:   09/02/2016               Expected Discharge Plan:  Home/Self Care  In-House Referral:  NA  Discharge planning Services  CM Consult  Post Acute Care Choice:  NA Choice offered to:  NA  Status of Service:  Completed, signed off  Sherald Barge, RN 09/01/2016, 2:11 PM

## 2016-09-01 NOTE — H&P (Signed)
NAME:  Denise Ray,                            ACCOUNT NO.:  MEDICAL RECORD NO.:  [id number]  LOCATION:                                 FACILITY:  PHYSICIAN:  Kyiah Canepa L. Luan Pulling, M.D.     DATE OF BIRTH:  DATE OF ADMISSION: DATE OF DISCHARGE:  LH                             HISTORY & PHYSICAL   REASON FOR ADMISSION:  COPD exacerbation.  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  She would get better and then worse.  She has been on and off antibiotics.  She has got much worse in the last few days and came into the office.  When she was seen in the office, she was in mild respiratory distress.  In considering the fact that she has had at least 2 episodes requiring intubation and mechanical ventilation, and that she has not responded well to outpatient treatment.  She is going to be admitted to treat her COPD exacerbation.  She may have pneumonia. She has a history of sleep apnea.  She had been noncompliant with CPAP, but she is doing much better with that now.  PAST MEDICAL HISTORY: 1. GERD. 2. Migraines. 3. Arthritis. 4. TIA. 5. Hemorrhoids. 6. Chronic pain. 7. Hypothyroidism. 8. Sleep apnea. 9. Previous episodes of pneumonia. 10.She has had a rectal tumor that is being treated. 11.She also has obesity.  PAST SURGICAL HISTORY: 1. Positive for hysterectomy with bilateral salpingo-oophorectomy. 2. VATS procedure. 3. Several biopsies of the area in her rectum.  FAMILY HISTORY:  Positive for breast cancer in multiple family members. Alcohol abuse in her father.  SOCIAL HISTORY:  She has about a 30 pack-year smoking history, but I believe she has stopped now.  REVIEW OF SYSTEMS:  Except as mentioned, she has rectal pain. Otherwise, 10-point review of systems is negative.  PHYSICAL EXAMINATION:  CONSTITUTIONAL:  She is awake and alert, obese, in mild  respiratory distress. EYES:  Pupils reactive.  EOMI. EARS, NOSE, MOUTH, AND THROAT:  Her mucous membranes are moist.  Hearing is grossly normal. CARDIOVASCULAR:  Her heart is regular with normal heart sounds.  No edema. RESPIRATORY:  Her respiratory effort is increased.  She has bilateral rhonchi and wheezing. GASTROINTESTINAL:  Abdomen is soft with no masses.  Bowel sounds are present and active. SKIN:  Warm and dry. MUSCULOSKELETAL:  Normal strength.  She can rise unassisted. NEUROLOGIC:  No focal abnormalities. PSYCHIATRIC:  Normal mood and affect.  ASSESSMENT:  She has chronic obstructive pulmonary disease exacerbation. She could have pneumonia.  She is going to be admitted for IV treatment considering her significant problems in the past with respiratory failure requiring ventilator support.     Hamdi Vari L. Luan Pulling, M.D.     ELH/MEDQ  D:  08/31/2016  T:  08/31/2016  Job:  [id number]

## 2016-09-01 NOTE — Progress Notes (Signed)
Central tele called and stated pt had a run of V-tach. MD notified. EKG ordered and blood work as well. Continue to monitor.

## 2016-09-01 NOTE — Progress Notes (Signed)
Subjective: She says she feels a little better. She's coughing nonproductively. She says she will bring a flutter valve from home. I discussed her chest x-ray with her. I have personally reviewed this and it shows some chronic infiltrations and I think are probably scar tissue from her episode of ARDS. There is a suggestion at least that she might have some heart failure. She has cardiomegaly. She says that she occasionally takes Lasix at home. She's not having PND or orthopnea.  Objective: Vital signs in last 24 hours: Temp:  [97.5 F (36.4 C)-98 F (36.7 C)] 97.6 F (36.4 C) (04/17 0557) Pulse Rate:  [81-100] 85 (04/17 0557) Resp:  [18-21] 18 (04/17 0557) BP: (128-138)/(61-77) 132/61 (04/17 0557) SpO2:  [93 %-99 %] 99 % (04/17 0718) Weight:  [120.7 kg (266 lb)-121.3 kg (267 lb 6.4 oz)] 121.3 kg (267 lb 6.4 oz) (04/17 0557) Weight change:  Last BM Date: 08/30/16  Intake/Output from previous day: 04/16 0701 - 04/17 0700 In: 1236.7 [P.O.:240; I.V.:696.7; IV Piggyback:300] Out: -   PHYSICAL EXAM General appearance: alert, cooperative, mild distress and morbidly obese Resp: rhonchi bilaterally Cardio: regular rate and rhythm, S1, S2 normal, no murmur, click, rub or gallop GI: soft, non-tender; bowel sounds normal; no masses,  no organomegaly Extremities: extremities normal, atraumatic, no cyanosis or edema Skin warm and dry. Mucous membranes are moist.  Lab Results:  Results for orders placed or performed during the hospital encounter of 08/31/16 (from the past 48 hour(s))  MRSA PCR Screening     Status: None   Collection Time: 08/31/16  3:31 PM  Result Value Ref Range   MRSA by PCR NEGATIVE NEGATIVE    Comment:        The GeneXpert MRSA Assay (FDA approved for NASAL specimens only), is one component of a comprehensive MRSA colonization surveillance program. It is not intended to diagnose MRSA infection nor to guide or monitor treatment for MRSA infections.   HIV antibody  (Routine Testing)     Status: None   Collection Time: 08/31/16  3:57 PM  Result Value Ref Range   HIV Screen 4th Generation wRfx Non Reactive Non Reactive    Comment: (NOTE) Performed At: [facility] [place], [place] [id number] Lindon Romp MD ZC:[id number]   Comprehensive metabolic panel     Status: Abnormal   Collection Time: 08/31/16  3:57 PM  Result Value Ref Range   Sodium 136 135 - 145 mmol/L   Potassium 3.4 (L) 3.5 - 5.1 mmol/L   Chloride 101 101 - 111 mmol/L   CO2 21 (L) 22 - 32 mmol/L   Glucose, Bld 100 (H) 65 - 99 mg/dL   BUN 11 6 - 20 mg/dL   Creatinine, Ser 0.62 0.44 - 1.00 mg/dL   Calcium 8.7 (L) 8.9 - 10.3 mg/dL   Total Protein 7.5 6.5 - 8.1 g/dL   Albumin 3.8 3.5 - 5.0 g/dL   AST 20 15 - 41 U/L   ALT 16 14 - 54 U/L   Alkaline Phosphatase 58 38 - 126 U/L   Total Bilirubin 0.5 0.3 - 1.2 mg/dL   GFR calc non Af Amer >60 >60 mL/min   GFR calc Af Amer >60 >60 mL/min    Comment: (NOTE) The eGFR has been calculated using the CKD EPI equation. This calculation has not been validated in all clinical situations. eGFR's persistently <60 mL/min signify possible Chronic Kidney Disease.    Anion gap 14 5 - 15  CBC WITH  DIFFERENTIAL     Status: None   Collection Time: 08/31/16  3:57 PM  Result Value Ref Range   WBC 8.1 4.0 - 10.5 K/uL   RBC 4.81 3.87 - 5.11 MIL/uL   Hemoglobin 14.2 12.0 - 15.0 g/dL   HCT 41.6 36.0 - 46.0 %   MCV 86.5 78.0 - 100.0 fL   MCH 29.5 26.0 - 34.0 pg   MCHC 34.1 30.0 - 36.0 g/dL   RDW 13.9 11.5 - 15.5 %   Platelets 282 150 - 400 K/uL   Neutrophils Relative % 66 %   Neutro Abs 5.4 1.7 - 7.7 K/uL   Lymphocytes Relative 24 %   Lymphs Abs 1.9 0.7 - 4.0 K/uL   Monocytes Relative 8 %   Monocytes Absolute 0.6 0.1 - 1.0 K/uL   Eosinophils Relative 2 %   Eosinophils Absolute 0.2 0.0 - 0.7 K/uL   Basophils Relative 0 %   Basophils Absolute 0.0 0.0 - 0.1 K/uL    ABGS No results for input(s): PHART, PO2ART, TCO2,  HCO3 in the last 72 hours.  Invalid input(s): PCO2 CULTURES Recent Results (from the past 240 hour(s))  MRSA PCR Screening     Status: None   Collection Time: 08/31/16  3:31 PM  Result Value Ref Range Status   MRSA by PCR NEGATIVE NEGATIVE Final    Comment:        The GeneXpert MRSA Assay (FDA approved for NASAL specimens only), is one component of a comprehensive MRSA colonization surveillance program. It is not intended to diagnose MRSA infection nor to guide or monitor treatment for MRSA infections.    Studies/Results: X-ray Chest Pa And Lateral  Result Date: 08/31/2016 CLINICAL DATA:  Acute onset of wheezing.  Initial encounter. EXAM: CHEST  2 VIEW COMPARISON:  Chest radiograph performed 05/11/2015 FINDINGS: The lungs are well-aerated. Mild vascular congestion is noted. Mild bibasilar opacities may reflect mild interstitial edema. There is no evidence of pleural effusion or pneumothorax. The heart is borderline normal in size. No acute osseous abnormalities are seen. IMPRESSION: Mild vascular congestion.  Question of mild interstitial edema. Electronically Signed   By: Garald Balding M.D.   On: 08/31/2016 18:25    Medications:  Prior to Admission:  Prescriptions Prior to Admission  Medication Sig Dispense Refill Last Dose  . albuterol (PROVENTIL HFA;VENTOLIN HFA) 108 (90 BASE) MCG/ACT inhaler Inhale 2 puffs into the lungs every 6 (six) hours as needed. wheezing 1 Inhaler 11 Past Week at Unknown time  . albuterol (PROVENTIL) (5 MG/ML) 0.5% nebulizer solution Take 2.5 mg by nebulization every 6 (six) hours as needed for wheezing or shortness of breath.   More than a month at Unknown time  . ALPRAZolam (XANAX) 1 MG tablet Take 1 mg by mouth 2 (two) times daily.   12/18/2015 at 0200  . HYDROcodone-acetaminophen (NORCO) 10-325 MG tablet Take 1 tablet by mouth every 6 (six) hours.  0   . pantoprazole (PROTONIX) 40 MG tablet Take 40 mg by mouth every morning.    12/18/2015 at 0200  .  polyethylene glycol (MIRALAX / GLYCOLAX) packet Take 17 g by mouth daily as needed for mild constipation.   never  . HYDROcodone-acetaminophen (NORCO/VICODIN) 5-325 MG tablet Take 1-2 tablets by mouth every 6 (six) hours as needed for moderate pain. 20 tablet 0    Scheduled: . ALPRAZolam  1 mg Oral BID  . azithromycin  500 mg Intravenous Q24H  . cefTRIAXone (ROCEPHIN)  IV  1 g Intravenous Q24H  .  enoxaparin (LOVENOX) injection  60 mg Subcutaneous Q24H  . guaiFENesin  600 mg Oral BID  . ipratropium-albuterol  3 mL Nebulization TID  . methylPREDNISolone (SOLU-MEDROL) injection  40 mg Intravenous Q12H  . pantoprazole  40 mg Oral q morning - 10a  . potassium chloride  20 mEq Oral BID  . sodium chloride flush  3 mL Intravenous Q12H   Continuous: . sodium chloride 50 mL/hr at 08/31/16 1537   VHS:[id number] **OR** acetaminophen, albuterol, HYDROcodone-acetaminophen, ondansetron **OR** ondansetron (ZOFRAN) IV, polyethylene glycol  Assesment: She is admitted with COPD exacerbation and she is being treated as if she has community-acquired pneumonia. She does have infiltrates on chest x-ray but I can't definitively say that these are acute pneumonia. She has cardiomegaly and that will need further investigation. She has sleep apnea she is much more compliant now with her CPAP and is using it here in the hospital Active Problems:   COPD exacerbation ([place])    Plan: Add flutter valve. Check echocardiogram.    LOS: 1 day   Cordney Barstow L 09/01/2016, 8:28 AM

## 2016-09-02 ENCOUNTER — Inpatient Hospital Stay (HOSPITAL_COMMUNITY): Payer: 59

## 2016-09-02 LAB — BASIC METABOLIC PANEL
Anion gap: 12 (ref 5–15)
BUN: 13 mg/dL (ref 6–20)
CALCIUM: 8.7 mg/dL — AB (ref 8.9–10.3)
CHLORIDE: 103 mmol/L (ref 101–111)
CO2: 19 mmol/L — ABNORMAL LOW (ref 22–32)
CREATININE: 0.74 mg/dL (ref 0.44–1.00)
GFR calc Af Amer: >60 mL/min (ref >60)
GFR calc non Af Amer: >60 mL/min (ref >60)
Glucose, Bld: 344 mg/dL — ABNORMAL HIGH (ref 65–99)
Potassium: 4.3 mmol/L (ref 3.5–5.1)
SODIUM: 134 mmol/L — AB (ref 135–145)

## 2016-09-02 LAB — MAGNESIUM: MAGNESIUM: 1.9 mg/dL (ref 1.7–2.4)

## 2016-09-02 MED ORDER — IMIQUIMOD 5 % EX CREA
TOPICAL_CREAM | CUTANEOUS | Status: DC
  Administered 2016-09-03: 22:00:00 via TOPICAL
  Administered 2016-09-05: 1 via TOPICAL
  Administered 2016-09-08: 22:00:00 via TOPICAL

## 2016-09-02 NOTE — Progress Notes (Signed)
Inpatient Diabetes Program Recommendations  AACE/ADA: New Consensus Statement on Inpatient Glycemic Control (2015)  Target Ranges:  Prepandial:   less than 140 mg/dL      Peak postprandial:   less than 180 mg/dL (1-2 hours)      Critically ill patients:  140 - 180 mg/dL   Results for Denise Ray, Denise Ray (MRN [id number]) as of 09/02/2016 13:24  Ref. Range 01/27/2016 11:15 08/31/2016 15:57 09/02/2016 09:27  Glucose Latest Ref Range: 65 - 99 mg/dL  100 (H) 344 (H)  Hemoglobin A1C Latest Ref Range: 4.8 - 5.6 % 6.8 (H)     Review of Glycemic Control  Diabetes history: Borderline DM (per H&P on 05/07/15) Outpatient Diabetes medications: None Current orders for Inpatient glycemic control: None  Inpatient Diabetes Program Recommendations: Correction (SSI): While inpatient, please consider ordering CBGs with Novolog correction scale ACHS. HgbA1C: Please consider ordering an A1C to evaluate glycemic control over the past 2-3 months.  NOTE: Patient is ordered Solumedrol which is contributing to hyperglycemia. Noted in reviewing chart, per H&P on 05/07/15 that patient has a history of borderline DM.  Thanks, Barnie Alderman, RN, MSN, CDE Diabetes Coordinator Inpatient Diabetes Program [phone] (Team Pager from 8am to 5pm)

## 2016-09-02 NOTE — Progress Notes (Addendum)
Subjective: She says she feels about the same. She had episodes of V. tach yesterday and I have requested repeat potassium and magnesium level but they were not done through some communication error. I will get those this morning. No other new complaints. She is still short of breath. She's better but she has trouble when she gets up and moves around. Going to the bathroom causes worse problems  Objective: Vital signs in last 24 hours: Temp:  [97.7 F (36.5 C)-98 F (36.7 C)] 97.7 F (36.5 C) (04/18 0645) Pulse Rate:  [81-101] 81 (04/18 0645) Resp:  [19-20] 20 (04/18 0645) BP: (122-134)/(57-64) 130/57 (04/18 0645) SpO2:  [95 %-98 %] 95 % (04/18 0737) Weight change:  Last BM Date: 09/01/16  Intake/Output from previous day: 04/17 0701 - 04/18 0700 In: 2262.5 [P.O.:840; I.V.:1122.5; IV Piggyback:300] Out: 5000 [Urine:5000]  PHYSICAL EXAM General appearance: alert, cooperative and mild distress Resp: She still has prolonged expiratory phase but she is moving air much better Cardio: regular rate and rhythm, S1, S2 normal, no murmur, click, rub or gallop GI: soft, non-tender; bowel sounds normal; no masses,  no organomegaly Extremities: extremities normal, atraumatic, no cyanosis or edema Skin warm and dry  Lab Results:  Results for orders placed or performed during the hospital encounter of 08/31/16 (from the past 48 hour(s))  MRSA PCR Screening     Status: None   Collection Time: 08/31/16  3:31 PM  Result Value Ref Range   MRSA by PCR NEGATIVE NEGATIVE    Comment:        The GeneXpert MRSA Assay (FDA approved for NASAL specimens only), is one component of a comprehensive MRSA colonization surveillance program. It is not intended to diagnose MRSA infection nor to guide or monitor treatment for MRSA infections.   HIV antibody (Routine Testing)     Status: None   Collection Time: 08/31/16  3:57 PM  Result Value Ref Range   HIV Screen 4th Generation wRfx Non Reactive Non  Reactive    Comment: (NOTE) Performed At: [facility] [place], [place] [id number] Lindon Romp MD ML:[id number]   Comprehensive metabolic panel     Status: Abnormal   Collection Time: 08/31/16  3:57 PM  Result Value Ref Range   Sodium 136 135 - 145 mmol/L   Potassium 3.4 (L) 3.5 - 5.1 mmol/L   Chloride 101 101 - 111 mmol/L   CO2 21 (L) 22 - 32 mmol/L   Glucose, Bld 100 (H) 65 - 99 mg/dL   BUN 11 6 - 20 mg/dL   Creatinine, Ser 0.62 0.44 - 1.00 mg/dL   Calcium 8.7 (L) 8.9 - 10.3 mg/dL   Total Protein 7.5 6.5 - 8.1 g/dL   Albumin 3.8 3.5 - 5.0 g/dL   AST 20 15 - 41 U/L   ALT 16 14 - 54 U/L   Alkaline Phosphatase 58 38 - 126 U/L   Total Bilirubin 0.5 0.3 - 1.2 mg/dL   GFR calc non Af Amer >60 >60 mL/min   GFR calc Af Amer >60 >60 mL/min    Comment: (NOTE) The eGFR has been calculated using the CKD EPI equation. This calculation has not been validated in all clinical situations. eGFR's persistently <60 mL/min signify possible Chronic Kidney Disease.    Anion gap 14 5 - 15  CBC WITH DIFFERENTIAL     Status: None   Collection Time: 08/31/16  3:57 PM  Result Value Ref Range   WBC 8.1 4.0 - 10.5  K/uL   RBC 4.81 3.87 - 5.11 MIL/uL   Hemoglobin 14.2 12.0 - 15.0 g/dL   HCT 41.6 36.0 - 46.0 %   MCV 86.5 78.0 - 100.0 fL   MCH 29.5 26.0 - 34.0 pg   MCHC 34.1 30.0 - 36.0 g/dL   RDW 13.9 11.5 - 15.5 %   Platelets 282 150 - 400 K/uL   Neutrophils Relative % 66 %   Neutro Abs 5.4 1.7 - 7.7 K/uL   Lymphocytes Relative 24 %   Lymphs Abs 1.9 0.7 - 4.0 K/uL   Monocytes Relative 8 %   Monocytes Absolute 0.6 0.1 - 1.0 K/uL   Eosinophils Relative 2 %   Eosinophils Absolute 0.2 0.0 - 0.7 K/uL   Basophils Relative 0 %   Basophils Absolute 0.0 0.0 - 0.1 K/uL    ABGS No results for input(s): PHART, PO2ART, TCO2, HCO3 in the last 72 hours.  Invalid input(s): PCO2 CULTURES Recent Results (from the past 240 hour(s))  MRSA PCR Screening     Status: None    Collection Time: 08/31/16  3:31 PM  Result Value Ref Range Status   MRSA by PCR NEGATIVE NEGATIVE Final    Comment:        The GeneXpert MRSA Assay (FDA approved for NASAL specimens only), is one component of a comprehensive MRSA colonization surveillance program. It is not intended to diagnose MRSA infection nor to guide or monitor treatment for MRSA infections.    Studies/Results: X-ray Chest Pa And Lateral  Result Date: 08/31/2016 CLINICAL DATA:  Acute onset of wheezing.  Initial encounter. EXAM: CHEST  2 VIEW COMPARISON:  Chest radiograph performed 05/11/2015 FINDINGS: The lungs are well-aerated. Mild vascular congestion is noted. Mild bibasilar opacities may reflect mild interstitial edema. There is no evidence of pleural effusion or pneumothorax. The heart is borderline normal in size. No acute osseous abnormalities are seen. IMPRESSION: Mild vascular congestion.  Question of mild interstitial edema. Electronically Signed   By: Garald Balding M.D.   On: 08/31/2016 18:25    Medications:  Prior to Admission:  Prescriptions Prior to Admission  Medication Sig Dispense Refill Last Dose  . albuterol (PROVENTIL HFA;VENTOLIN HFA) 108 (90 BASE) MCG/ACT inhaler Inhale 2 puffs into the lungs every 6 (six) hours as needed. wheezing 1 Inhaler 11 Past Week at Unknown time  . albuterol (PROVENTIL) (5 MG/ML) 0.5% nebulizer solution Take 2.5 mg by nebulization every 6 (six) hours as needed for wheezing or shortness of breath.   More than a month at Unknown time  . ALPRAZolam (XANAX) 1 MG tablet Take 1 mg by mouth 2 (two) times daily.   12/18/2015 at 0200  . HYDROcodone-acetaminophen (NORCO) 10-325 MG tablet Take 1 tablet by mouth every 6 (six) hours.  0   . [START ON 09/03/2016] imiquimod (ALDARA) 5 % cream Apply 1 application topically 3 (three) times a week.     . pantoprazole (PROTONIX) 40 MG tablet Take 40 mg by mouth every morning.    12/18/2015 at 0200  . polyethylene glycol (MIRALAX / GLYCOLAX)  packet Take 17 g by mouth daily as needed for mild constipation.   never  . HYDROcodone-acetaminophen (NORCO/VICODIN) 5-325 MG tablet Take 1-2 tablets by mouth every 6 (six) hours as needed for moderate pain. 20 tablet 0    Scheduled: . ALPRAZolam  1 mg Oral BID  . enoxaparin (LOVENOX) injection  60 mg Subcutaneous Q24H  . guaiFENesin  600 mg Oral BID  . [START ON 09/03/2016] imiquimod  Topical Once per day on Tue Thu Sat  . ipratropium-albuterol  3 mL Nebulization TID  . methylPREDNISolone (SOLU-MEDROL) injection  40 mg Intravenous Q12H  . pantoprazole  40 mg Oral q morning - 10a  . potassium chloride  20 mEq Oral BID  . sodium chloride flush  3 mL Intravenous Q12H   Continuous: . sodium chloride 50 mL/hr at 09/01/16 1153  . azithromycin    . cefTRIAXone (ROCEPHIN)  IV     TJL:[id number] **OR** acetaminophen, albuterol, HYDROcodone-acetaminophen, ondansetron **OR** ondansetron (ZOFRAN) IV, polyethylene glycol  Assesment: She is admitted with community-acquired pneumonia. She has COPD exacerbation. She has acute on chronic respiratory failure. She is still short of breath with minimal exertion. She has required intubation and ventilation at least twice She has obstructive sleep apnea using her CPAP now. She had episodes of ventricular tachycardia yesterday and she was hypokalemic and that will be rechecked. There was a suggestion of heart failure on chest x-ray and echocardiogram has been ordered but has not been completed yet. She's not ready for discharge. She has a rectal cancer and is using a cream on that and I have put in a consultation to pharmacy so that she can use that from home Active Problems:   CAP (community acquired pneumonia)   Hypokalemia   Obesity, unspecified   Respiratory failure (HCC)   OSA (obstructive sleep apnea)   COPD exacerbation ([place])    Plan: As above    LOS: 2 days   Denise Ray L 09/02/2016, 8:30 AM

## 2016-09-03 ENCOUNTER — Inpatient Hospital Stay (HOSPITAL_COMMUNITY): Payer: 59

## 2016-09-03 DIAGNOSIS — R0600 Dyspnea, unspecified: Secondary | ICD-10-CM

## 2016-09-03 LAB — GLUCOSE, CAPILLARY
GLUCOSE-CAPILLARY: 204 mg/dL — AB (ref 65–99)
GLUCOSE-CAPILLARY: 167 mg/dL — AB (ref 65–99)
Glucose-Capillary: 232 mg/dL — ABNORMAL HIGH (ref 65–99)
Glucose-Capillary: 256 mg/dL — ABNORMAL HIGH (ref 65–99)

## 2016-09-03 LAB — ECHOCARDIOGRAM COMPLETE
HEIGHTINCHES: 64 in
Weight: 4148.18 oz

## 2016-09-03 MED ORDER — AZITHROMYCIN 250 MG PO TABS
500.0000 mg | ORAL_TABLET | Freq: Every day | ORAL | Status: DC
  Administered 2016-09-03 – 2016-09-10 (×8): 500 mg via ORAL
  Filled 2016-09-03 (×8): qty 2

## 2016-09-03 MED ORDER — INSULIN ASPART 100 UNIT/ML ~~LOC~~ SOLN
0.0000 [IU] | Freq: Three times a day (TID) | SUBCUTANEOUS | Status: DC
  Administered 2016-09-03: 7 [IU] via SUBCUTANEOUS
  Administered 2016-09-03: 8 [IU] via SUBCUTANEOUS
  Administered 2016-09-04: 7 [IU] via SUBCUTANEOUS
  Administered 2016-09-04: 3 [IU] via SUBCUTANEOUS
  Administered 2016-09-04: 11 [IU] via SUBCUTANEOUS
  Administered 2016-09-05: 4 [IU] via SUBCUTANEOUS
  Administered 2016-09-05: 3 [IU] via SUBCUTANEOUS
  Administered 2016-09-05: 4 [IU] via SUBCUTANEOUS
  Administered 2016-09-06: 7 [IU] via SUBCUTANEOUS
  Administered 2016-09-06: 3 [IU] via SUBCUTANEOUS
  Administered 2016-09-07 (×3): 4 [IU] via SUBCUTANEOUS
  Administered 2016-09-08: 7 [IU] via SUBCUTANEOUS
  Administered 2016-09-08 (×2): 4 [IU] via SUBCUTANEOUS
  Administered 2016-09-09: 7 [IU] via SUBCUTANEOUS
  Administered 2016-09-09 – 2016-09-10 (×2): 2 [IU] via SUBCUTANEOUS
  Administered 2016-09-10: 7 [IU] via SUBCUTANEOUS
  Administered 2016-09-10: 3 [IU] via SUBCUTANEOUS
  Administered 2016-09-11: 4 [IU] via SUBCUTANEOUS

## 2016-09-03 MED ORDER — INSULIN ASPART 100 UNIT/ML ~~LOC~~ SOLN
0.0000 [IU] | Freq: Every day | SUBCUTANEOUS | Status: DC
  Administered 2016-09-03: 3 [IU] via SUBCUTANEOUS

## 2016-09-03 MED FILL — ALPRAZolam 1 MG TABS: 1 | 90 days supply | Qty: 180 | Fill #1

## 2016-09-03 MED FILL — HYDROCODON-APAP 10-325: 10-325 | 30 days supply | Qty: 120 | Fill #0

## 2016-09-03 MED FILL — PANTOPRAZOLE SOD DR 40 MG T: 40 | 90 days supply | Qty: 90 | Fill #1

## 2016-09-03 NOTE — Progress Notes (Signed)
PHARMACIST - PHYSICIAN COMMUNICATION DR:   Luan Pulling CONCERNING: Antibiotic IV to Oral Route Change Policy  RECOMMENDATION: This patient is receiving ZITHROMAX by the intravenous route.  Based on criteria approved by the Pharmacy and Therapeutics Committee, the antibiotic(s) is/are being converted to the equivalent oral dose form(s).  DESCRIPTION: These criteria include:  Patient being treated for a respiratory tract infection, urinary tract infection, cellulitis or clostridium difficile associated diarrhea if on metronidazole  The patient is not neutropenic and does not exhibit a GI malabsorption state  The patient is eating (either orally or via tube) and/or has been taking other orally administered medications for a least 24 hours  The patient is improving clinically and has a Tmax < 100.5  If you have questions about this conversion, please contact the Pharmacy Department  [x]   [phone] )  [facility] []   [phone] )  [facility] []   [phone] )  [facility] []   [phone] )  [facility] []   [phone] )  [place], PharmD Clinical Pharmacist Pager:  [phone] 09/03/2016

## 2016-09-03 NOTE — Progress Notes (Signed)
Subjective: She says she is still very short of breath. She feels like she is short of breath at rest to some extent. She gets much more short of breath with exertion. No definite PND or orthopnea. Her echocardiogram is still pending. She is coughing mostly nonproductively. She still has wheezing. She's been somewhat anxious. Her blood sugar was elevated on her basic metabolic profile so she's going to need to be treated for that. She's not had any more episodes of ventricular tachycardia  Objective: Vital signs in last 24 hours: Temp:  [97.6 F (36.4 C)-98.2 F (36.8 C)] 97.6 F (36.4 C) (04/19 0630) Pulse Rate:  [79-96] 79 (04/19 0630) Resp:  [15-20] 15 (04/19 0630) BP: (117-148)/(57-87) 117/57 (04/19 0630) SpO2:  [95 %-97 %] 95 % (04/19 0746) Weight:  [117.6 kg (259 lb 4.2 oz)] 117.6 kg (259 lb 4.2 oz) (04/19 0630) Weight change:  Last BM Date: 09/02/16  Intake/Output from previous day: 04/18 0701 - 04/19 0700 In: 1509.2 [P.O.:240; I.V.:1269.2] Out: 1300 [Urine:1300]  PHYSICAL EXAM General appearance: alert, cooperative, mild distress and morbidly obese Resp: She seems to be moving air better but still has prolonged expiratory phase and rhonchi bilaterally Cardio: regular rate and rhythm, S1, S2 normal, no murmur, click, rub or gallop GI: soft, non-tender; bowel sounds normal; no masses,  no organomegaly Extremities: extremities normal, atraumatic, no cyanosis or edema Skin warm and dry.  Lab Results:  Results for orders placed or performed during the hospital encounter of 08/31/16 (from the past 48 hour(s))  Basic metabolic panel     Status: Abnormal   Collection Time: 09/02/16  9:27 AM  Result Value Ref Range   Sodium 134 (L) 135 - 145 mmol/L   Potassium 4.3 3.5 - 5.1 mmol/L    Comment: DELTA CHECK NOTED   Chloride 103 101 - 111 mmol/L   CO2 19 (L) 22 - 32 mmol/L   Glucose, Bld 344 (H) 65 - 99 mg/dL   BUN 13 6 - 20 mg/dL   Creatinine, Ser 0.74 0.44 - 1.00 mg/dL   Calcium 8.7 (L) 8.9 - 10.3 mg/dL   GFR calc non Af Amer >60 >60 mL/min   GFR calc Af Amer >60 >60 mL/min    Comment: (NOTE) The eGFR has been calculated using the CKD EPI equation. This calculation has not been validated in all clinical situations. eGFR's persistently <60 mL/min signify possible Chronic Kidney Disease.    Anion gap 12 5 - 15  Magnesium     Status: None   Collection Time: 09/02/16  9:27 AM  Result Value Ref Range   Magnesium 1.9 1.7 - 2.4 mg/dL  Glucose, capillary     Status: Abnormal   Collection Time: 09/03/16  7:59 AM  Result Value Ref Range   Glucose-Capillary 232 (H) 65 - 99 mg/dL   Comment 1 Notify RN    Comment 2 Document in Chart     ABGS No results for input(s): PHART, PO2ART, TCO2, HCO3 in the last 72 hours.  Invalid input(s): PCO2 CULTURES Recent Results (from the past 240 hour(s))  MRSA PCR Screening     Status: None   Collection Time: 08/31/16  3:31 PM  Result Value Ref Range Status   MRSA by PCR NEGATIVE NEGATIVE Final    Comment:        The GeneXpert MRSA Assay (FDA approved for NASAL specimens only), is one component of a comprehensive MRSA colonization surveillance program. It is not intended to diagnose MRSA infection nor to  guide or monitor treatment for MRSA infections.    Studies/Results: No results found.  Medications:  Prior to Admission:  Prescriptions Prior to Admission  Medication Sig Dispense Refill Last Dose  . albuterol (PROVENTIL HFA;VENTOLIN HFA) 108 (90 BASE) MCG/ACT inhaler Inhale 2 puffs into the lungs every 6 (six) hours as needed. wheezing 1 Inhaler 11 Past Week at Unknown time  . albuterol (PROVENTIL) (5 MG/ML) 0.5% nebulizer solution Take 2.5 mg by nebulization every 6 (six) hours as needed for wheezing or shortness of breath.   More than a month at Unknown time  . ALPRAZolam (XANAX) 1 MG tablet Take 1 mg by mouth 2 (two) times daily.   12/18/2015 at 0200  . HYDROcodone-acetaminophen (NORCO) 10-325 MG tablet  Take 1 tablet by mouth every 6 (six) hours.  0   . imiquimod (ALDARA) 5 % cream Apply 1 application topically 3 (three) times a week.     . pantoprazole (PROTONIX) 40 MG tablet Take 40 mg by mouth every morning.    12/18/2015 at 0200  . polyethylene glycol (MIRALAX / GLYCOLAX) packet Take 17 g by mouth daily as needed for mild constipation.   never  . HYDROcodone-acetaminophen (NORCO/VICODIN) 5-325 MG tablet Take 1-2 tablets by mouth every 6 (six) hours as needed for moderate pain. 20 tablet 0    Scheduled: . ALPRAZolam  1 mg Oral BID  . enoxaparin (LOVENOX) injection  60 mg Subcutaneous Q24H  . guaiFENesin  600 mg Oral BID  . imiquimod   Topical Once per day on Tue Thu Sat  . insulin aspart  0-20 Units Subcutaneous TID WC  . insulin aspart  0-5 Units Subcutaneous QHS  . ipratropium-albuterol  3 mL Nebulization TID  . methylPREDNISolone (SOLU-MEDROL) injection  40 mg Intravenous Q12H  . pantoprazole  40 mg Oral q morning - 10a  . potassium chloride  20 mEq Oral BID  . sodium chloride flush  3 mL Intravenous Q12H   Continuous: . sodium chloride 50 mL/hr at 09/01/16 1153  . azithromycin    . cefTRIAXone (ROCEPHIN)  IV     [id number] **OR** acetaminophen, albuterol, HYDROcodone-acetaminophen, ondansetron **OR** ondansetron (ZOFRAN) IV, polyethylene glycol  Assesment: She is admitted with community-acquired pneumonia. At baseline she has some chronic pulmonary disease both from cigarette smoking and from an episode of ARDS this eventually culminated in her being intubated and placed on mechanical ventilation. However at baseline she is able to ambulate and actually works full-time. She's had trouble for about a month with increasing shortness of breath. She doesn't feel like she's improved a great deal although she is moving air better than when she was admitted. There is concern that she may have some element of heart failure but her echocardiogram has not been done yet. She has  obstructive sleep apnea and had been noncompliant with that prior to her second episode requiring intubation and mechanical ventilation but she has been compliant since. She has obesity which is unchanged. Her potassium was low and that has been replaced. She had episodes of ventricular tachycardia perhaps related to hypokalemia. She has elevated blood sugar I think related to steroid use but she's going to need coverage for this Active Problems:   CAP (community acquired pneumonia)   Hypokalemia   Obesity, unspecified   Respiratory failure (HCC)   OSA (obstructive sleep apnea)   COPD exacerbation ([place])    Plan: Echocardiogram today. Start sliding scale insulin.    LOS: 3 days   Annalyssa Thune L 09/03/2016, 8:39 AM

## 2016-09-03 NOTE — Progress Notes (Signed)
*  PRELIMINARY RESULTS* Echocardiogram 2D Echocardiogram has been performed.  Leavy Cella 09/03/2016, 10:15 AM

## 2016-09-04 LAB — EXPECTORATED SPUTUM ASSESSMENT W GRAM STAIN, RFLX TO RESP C: Special Requests: NORMAL

## 2016-09-04 LAB — GLUCOSE, CAPILLARY
GLUCOSE-CAPILLARY: 253 mg/dL — AB (ref 65–99)
Glucose-Capillary: 150 mg/dL — ABNORMAL HIGH (ref 65–99)
Glucose-Capillary: 208 mg/dL — ABNORMAL HIGH (ref 65–99)
Glucose-Capillary: 159 mg/dL — ABNORMAL HIGH (ref 65–99)

## 2016-09-04 LAB — EXPECTORATED SPUTUM ASSESSMENT W REFEX TO RESP CULTURE

## 2016-09-04 LAB — HEMOGLOBIN A1C
HEMOGLOBIN A1C: 6.6 % — AB (ref 4.8–5.6)
MEAN PLASMA GLUCOSE: 143 mg/dL

## 2016-09-04 MED ORDER — INSULIN GLARGINE 100 UNIT/ML ~~LOC~~ SOLN
10.0000 [IU] | Freq: Every day | SUBCUTANEOUS | Status: DC
  Administered 2016-09-04: 10 [IU] via SUBCUTANEOUS
  Filled 2016-09-04 (×4): qty 0.1

## 2016-09-04 MED ORDER — MOMETASONE FURO-FORMOTEROL FUM 200-5 MCG/ACT IN AERO
2.0000 | INHALATION_SPRAY | Freq: Two times a day (BID) | RESPIRATORY_TRACT | Status: DC
Start: 2016-09-04 — End: 2016-09-11
  Administered 2016-09-04 – 2016-09-11 (×15): 2 via RESPIRATORY_TRACT
  Filled 2016-09-04: qty 8.8

## 2016-09-04 MED ORDER — LEVALBUTEROL HCL 1.25 MG/0.5ML IN NEBU
INHALATION_SOLUTION | RESPIRATORY_TRACT | Status: AC
  Administered 2016-09-04: 5 mg
  Filled 2016-09-04: qty 2

## 2016-09-04 MED ORDER — MAGIC MOUTHWASH
5.0000 mL | Freq: Three times a day (TID) | ORAL | Status: DC
  Administered 2016-09-04 – 2016-09-10 (×25): 5 mL via ORAL
  Filled 2016-09-04 (×23): qty 5

## 2016-09-04 MED ORDER — IPRATROPIUM BROMIDE 0.02 % IN SOLN
RESPIRATORY_TRACT | Status: AC
  Administered 2016-09-04: 1 mg
  Filled 2016-09-04: qty 5

## 2016-09-04 MED ORDER — FUROSEMIDE 10 MG/ML IJ SOLN
40.0000 mg | Freq: Once | INTRAMUSCULAR | Status: AC
  Administered 2016-09-04: 40 mg via INTRAVENOUS
  Filled 2016-09-04: qty 4

## 2016-09-04 NOTE — Progress Notes (Signed)
Additional treatment given due to pt increased SOB and WOB audible wheezing and cough. Spo2 100%... Suggest pt try going on CPAP machine to help with WOB

## 2016-09-04 NOTE — Progress Notes (Signed)
Subjective: She continues to have significant difficulty. She is still short of breath with minimal exertion. She has wheezing at rest. She had early breathing treatment this morning and that helped some. She had echocardiogram yesterday that did not show heart failure. She still has nonproductive cough. Her blood sugar is still up on sliding scale insulin  Objective: Vital signs in last 24 hours: Temp:  [97.5 F (36.4 C)-98 F (36.7 C)] 97.9 F (36.6 C) (04/20 0645) Pulse Rate:  [74-99] 99 (04/20 0645) Resp:  [16-21] 21 (04/20 0645) BP: (120-144)/(67-79) 144/68 (04/20 0645) SpO2:  [94 %-100 %] 97 % (04/20 0645) Weight:  [121.8 kg (268 lb 8 oz)] 121.8 kg (268 lb 8 oz) (04/20 0645) Weight change: 4.191 kg (9 lb 3.8 oz) Last BM Date: 09/03/16  Intake/Output from previous day: 04/19 0701 - 04/20 0700 In: 720 [P.O.:720] Out: 800 [Urine:800]  PHYSICAL EXAM General appearance: alert, cooperative, moderate distress and morbidly obese Resp: She has wheezing that is audible without the use of a stethoscope Cardio: regular rate and rhythm, S1, S2 normal, no murmur, click, rub or gallop GI: soft, non-tender; bowel sounds normal; no masses,  no organomegaly Extremities: Trace edema Skin warm and dry. Mucous membranes are moist.  Lab Results:  Results for orders placed or performed during the hospital encounter of 08/31/16 (from the past 48 hour(s))  Basic metabolic panel     Status: Abnormal   Collection Time: 09/02/16  9:27 AM  Result Value Ref Range   Sodium 134 (L) 135 - 145 mmol/L   Potassium 4.3 3.5 - 5.1 mmol/L    Comment: DELTA CHECK NOTED   Chloride 103 101 - 111 mmol/L   CO2 19 (L) 22 - 32 mmol/L   Glucose, Bld 344 (H) 65 - 99 mg/dL   BUN 13 6 - 20 mg/dL   Creatinine, Ser 0.74 0.44 - 1.00 mg/dL   Calcium 8.7 (L) 8.9 - 10.3 mg/dL   GFR calc non Af Amer >60 >60 mL/min   GFR calc Af Amer >60 >60 mL/min    Comment: (NOTE) The eGFR has been calculated using the CKD EPI  equation. This calculation has not been validated in all clinical situations. eGFR's persistently <60 mL/min signify possible Chronic Kidney Disease.    Anion gap 12 5 - 15  Magnesium     Status: None   Collection Time: 09/02/16  9:27 AM  Result Value Ref Range   Magnesium 1.9 1.7 - 2.4 mg/dL  Glucose, capillary     Status: Abnormal   Collection Time: 09/03/16  7:59 AM  Result Value Ref Range   Glucose-Capillary 232 (H) 65 - 99 mg/dL   Comment 1 Notify RN    Comment 2 Document in Chart   Hemoglobin A1c     Status: Abnormal   Collection Time: 09/03/16  8:33 AM  Result Value Ref Range   Hgb A1c MFr Bld 6.6 (H) 4.8 - 5.6 %    Comment: (NOTE)         Pre-diabetes: 5.7 - 6.4         Diabetes: >6.4         Glycemic control for adults with diabetes: <7.0    Mean Plasma Glucose 143 mg/dL    Comment: (NOTE) Performed At: [facility] [address] [place], [place] [id number] Lindon Romp MD [id number]   Glucose, capillary     Status: Abnormal   Collection Time: 09/03/16 11:11 AM  Result Value Ref Range  Glucose-Capillary 204 (H) 65 - 99 mg/dL   Comment 1 Notify RN    Comment 2 Document in Chart   Glucose, capillary     Status: Abnormal   Collection Time: 09/03/16  4:05 PM  Result Value Ref Range   Glucose-Capillary 167 (H) 65 - 99 mg/dL   Comment 1 Notify RN    Comment 2 Document in Chart   Glucose, capillary     Status: Abnormal   Collection Time: 09/03/16  9:01 PM  Result Value Ref Range   Glucose-Capillary 256 (H) 65 - 99 mg/dL   Comment 1 Notify RN    Comment 2 Document in Chart   Glucose, capillary     Status: Abnormal   Collection Time: 09/04/16  7:35 AM  Result Value Ref Range   Glucose-Capillary 253 (H) 65 - 99 mg/dL   Comment 1 Notify RN    Comment 2 Document in Chart     ABGS No results for input(s): PHART, PO2ART, TCO2, HCO3 in the last 72 hours.  Invalid input(s): PCO2 CULTURES Recent Results (from the past 240 hour(s))  MRSA PCR  Screening     Status: None   Collection Time: 08/31/16  3:31 PM  Result Value Ref Range Status   MRSA by PCR NEGATIVE NEGATIVE Final    Comment:        The GeneXpert MRSA Assay (FDA approved for NASAL specimens only), is one component of a comprehensive MRSA colonization surveillance program. It is not intended to diagnose MRSA infection nor to guide or monitor treatment for MRSA infections.    Studies/Results: No results found.  Medications:  Prior to Admission:  Prescriptions Prior to Admission  Medication Sig Dispense Refill Last Dose  . albuterol (PROVENTIL HFA;VENTOLIN HFA) 108 (90 BASE) MCG/ACT inhaler Inhale 2 puffs into the lungs every 6 (six) hours as needed. wheezing 1 Inhaler 11 Past Week at Unknown time  . albuterol (PROVENTIL) (5 MG/ML) 0.5% nebulizer solution Take 2.5 mg by nebulization every 6 (six) hours as needed for wheezing or shortness of breath.   More than a month at Unknown time  . ALPRAZolam (XANAX) 1 MG tablet Take 1 mg by mouth 2 (two) times daily.   12/18/2015 at 0200  . HYDROcodone-acetaminophen (NORCO) 10-325 MG tablet Take 1 tablet by mouth every 6 (six) hours.  0   . imiquimod (ALDARA) 5 % cream Apply 1 application topically 3 (three) times a week.     . pantoprazole (PROTONIX) 40 MG tablet Take 40 mg by mouth every morning.    12/18/2015 at 0200  . polyethylene glycol (MIRALAX / GLYCOLAX) packet Take 17 g by mouth daily as needed for mild constipation.   never  . HYDROcodone-acetaminophen (NORCO/VICODIN) 5-325 MG tablet Take 1-2 tablets by mouth every 6 (six) hours as needed for moderate pain. 20 tablet 0    Scheduled: . ALPRAZolam  1 mg Oral BID  . azithromycin  500 mg Oral Daily  . enoxaparin (LOVENOX) injection  60 mg Subcutaneous Q24H  . furosemide  40 mg Intravenous Once  . guaiFENesin  600 mg Oral BID  . imiquimod   Topical Once per day on Tue Thu Sat  . insulin aspart  0-20 Units Subcutaneous TID WC  . insulin aspart  0-5 Units Subcutaneous  QHS  . insulin glargine  10 Units Subcutaneous Daily  . ipratropium-albuterol  3 mL Nebulization TID  . magic mouthwash  5 mL Oral TID PC & HS  . methylPREDNISolone (SOLU-MEDROL) injection  40 mg Intravenous Q12H  . mometasone-formoterol  2 puff Inhalation BID  . pantoprazole  40 mg Oral q morning - 10a  . potassium chloride  20 mEq Oral BID  . sodium chloride flush  3 mL Intravenous Q12H   Continuous: . cefTRIAXone (ROCEPHIN)  IV     XPF:[id number] **OR** acetaminophen, albuterol, HYDROcodone-acetaminophen, ondansetron **OR** ondansetron (ZOFRAN) IV, polyethylene glycol  Assesment: She was admitted with community-acquired pneumonia. She has acute on chronic hypoxic respiratory failure. She has COPD exacerbation. She is improving but it is very slow. She is still having significant difficulty with breathing with minimal exertion. I was concerned that she had heart failure but her echocardiogram looks okay. I'm still going to go ahead and discontinue IV fluids and give her 1 dose of Lasix. Continue with IV steroids. Add dulera. She says she's having a little bit of trouble with her mouth so I'm going to go ahead and treat her as if she has thrush Active Problems:   CAP (community acquired pneumonia)   Hypokalemia   Obesity, unspecified   Respiratory failure (HCC)   OSA (obstructive sleep apnea)   COPD exacerbation (HCC)    Plan: Discontinue IV fluids. Give Lasix. Add the inhaler. Treat her for thrush. No other changes. Continue IV antibiotics and steroids.    LOS: 4 days   Zetha Kuhar L 09/04/2016, 8:07 AM

## 2016-09-05 LAB — GLUCOSE, CAPILLARY
GLUCOSE-CAPILLARY: 190 mg/dL — AB (ref 65–99)
GLUCOSE-CAPILLARY: 161 mg/dL — AB (ref 65–99)
Glucose-Capillary: 141 mg/dL — ABNORMAL HIGH (ref 65–99)
Glucose-Capillary: 173 mg/dL — ABNORMAL HIGH (ref 65–99)

## 2016-09-05 MED ORDER — INSULIN GLARGINE 100 UNIT/ML ~~LOC~~ SOLN
12.0000 [IU] | Freq: Every day | SUBCUTANEOUS | Status: DC
  Administered 2016-09-05 – 2016-09-06 (×2): 12 [IU] via SUBCUTANEOUS
  Filled 2016-09-05 (×3): qty 0.12

## 2016-09-05 MED ORDER — IPRATROPIUM-ALBUTEROL 0.5-2.5 (3) MG/3ML IN SOLN
3.0000 mL | RESPIRATORY_TRACT | Status: DC
  Administered 2016-09-05 – 2016-09-11 (×34): 3 mL via RESPIRATORY_TRACT
  Filled 2016-09-05 (×34): qty 3

## 2016-09-05 NOTE — Progress Notes (Signed)
Subjective: She says she still has trouble breathing. She doesn't feel like the Lasix made much difference. She has been able to cough up some sputum. Her blood sugars still up but better. She has no other new complaints. She feels like she is very slowly improving  Objective: Vital signs in last 24 hours: Temp:  [97.8 F (36.6 C)-98.2 F (36.8 C)] 98.2 F (36.8 C) (04/21 0601) Pulse Rate:  [72-120] 80 (04/21 0601) Resp:  [18] 18 (04/21 0601) BP: (120-146)/(68-91) 136/68 (04/21 0601) SpO2:  [90 %-99 %] 98 % (04/21 0730) Weight change:  Last BM Date: 09/03/16  Intake/Output from previous day: 04/20 0701 - 04/21 0700 In: 720 [P.O.:720] Out: 4900 [Urine:4900]  PHYSICAL EXAM General appearance: alert, cooperative and mild distress Resp: wheezes bilaterally Cardio: regular rate and rhythm, S1, S2 normal, no murmur, click, rub or gallop GI: soft, non-tender; bowel sounds normal; no masses,  no organomegaly Extremities: extremities normal, atraumatic, no cyanosis or edema Skin warm and dry. Mucous membranes are moist  Lab Results:  Results for orders placed or performed during the hospital encounter of 08/31/16 (from the past 48 hour(s))  Glucose, capillary     Status: Abnormal   Collection Time: 09/03/16 11:11 AM  Result Value Ref Range   Glucose-Capillary 204 (H) 65 - 99 mg/dL   Comment 1 Notify RN    Comment 2 Document in Chart   Glucose, capillary     Status: Abnormal   Collection Time: 09/03/16  4:05 PM  Result Value Ref Range   Glucose-Capillary 167 (H) 65 - 99 mg/dL   Comment 1 Notify RN    Comment 2 Document in Chart   Glucose, capillary     Status: Abnormal   Collection Time: 09/03/16  9:01 PM  Result Value Ref Range   Glucose-Capillary 256 (H) 65 - 99 mg/dL   Comment 1 Notify RN    Comment 2 Document in Chart   Glucose, capillary     Status: Abnormal   Collection Time: 09/04/16  7:35 AM  Result Value Ref Range   Glucose-Capillary 253 (H) 65 - 99 mg/dL    Comment 1 Notify RN    Comment 2 Document in Chart   Glucose, capillary     Status: Abnormal   Collection Time: 09/04/16 11:31 AM  Result Value Ref Range   Glucose-Capillary 150 (H) 65 - 99 mg/dL   Comment 1 Notify RN    Comment 2 Document in Chart   Culture, sputum-assessment     Status: None   Collection Time: 09/04/16 12:04 PM  Result Value Ref Range   Specimen Description EXPECTORATED SPUTUM    Special Requests Normal    Sputum evaluation THIS SPECIMEN IS ACCEPTABLE FOR SPUTUM CULTURE    Report Status 09/04/2016 FINAL   Culture, respiratory (NON-Expectorated)     Status: None (Preliminary result)   Collection Time: 09/04/16 12:04 PM  Result Value Ref Range   Specimen Description EXPECTORATED SPUTUM    Special Requests Normal Reflexed from [id number]    Gram Stain      ABUNDANT WBC PRESENT,BOTH PMN AND MONONUCLEAR RARE GRAM POSITIVE COCCI IN PAIRS Performed at [facility], [address] [place], [place] [address]    Culture PENDING    Report Status PENDING   Glucose, capillary     Status: Abnormal   Collection Time: 09/04/16  3:53 PM  Result Value Ref Range   Glucose-Capillary 208 (H) 65 - 99 mg/dL   Comment 1 Notify RN  Comment 2 Document in Chart   Glucose, capillary     Status: Abnormal   Collection Time: 09/04/16 10:26 PM  Result Value Ref Range   Glucose-Capillary 159 (H) 65 - 99 mg/dL   Comment 1 Notify RN    Comment 2 Document in Chart   Glucose, capillary     Status: Abnormal   Collection Time: 09/05/16  7:44 AM  Result Value Ref Range   Glucose-Capillary 190 (H) 65 - 99 mg/dL    ABGS No results for input(s): PHART, PO2ART, TCO2, HCO3 in the last 72 hours.  Invalid input(s): PCO2 CULTURES Recent Results (from the past 240 hour(s))  MRSA PCR Screening     Status: None   Collection Time: 08/31/16  3:31 PM  Result Value Ref Range Status   MRSA by PCR NEGATIVE NEGATIVE Final    Comment:        The GeneXpert MRSA Assay (FDA approved for NASAL  specimens only), is one component of a comprehensive MRSA colonization surveillance program. It is not intended to diagnose MRSA infection nor to guide or monitor treatment for MRSA infections.   Culture, sputum-assessment     Status: None   Collection Time: 09/04/16 12:04 PM  Result Value Ref Range Status   Specimen Description EXPECTORATED SPUTUM  Final   Special Requests Normal  Final   Sputum evaluation THIS SPECIMEN IS ACCEPTABLE FOR SPUTUM CULTURE  Final   Report Status 09/04/2016 FINAL  Final  Culture, respiratory (NON-Expectorated)     Status: None (Preliminary result)   Collection Time: 09/04/16 12:04 PM  Result Value Ref Range Status   Specimen Description EXPECTORATED SPUTUM  Final   Special Requests Normal Reflexed from [id number]  Final   Gram Stain   Final    ABUNDANT WBC PRESENT,BOTH PMN AND MONONUCLEAR RARE GRAM POSITIVE COCCI IN PAIRS Performed at [facility], [place] [address] [place], [place] [address]    Culture PENDING  Incomplete   Report Status PENDING  Incomplete   Studies/Results: No results found.  Medications:  Prior to Admission:  Prescriptions Prior to Admission  Medication Sig Dispense Refill Last Dose  . albuterol (PROVENTIL HFA;VENTOLIN HFA) 108 (90 BASE) MCG/ACT inhaler Inhale 2 puffs into the lungs every 6 (six) hours as needed. wheezing 1 Inhaler 11 Past Week at Unknown time  . albuterol (PROVENTIL) (5 MG/ML) 0.5% nebulizer solution Take 2.5 mg by nebulization every 6 (six) hours as needed for wheezing or shortness of breath.   More than a month at Unknown time  . ALPRAZolam (XANAX) 1 MG tablet Take 1 mg by mouth 2 (two) times daily.   12/18/2015 at 0200  . HYDROcodone-acetaminophen (NORCO) 10-325 MG tablet Take 1 tablet by mouth every 6 (six) hours.  0   . imiquimod (ALDARA) 5 % cream Apply 1 application topically 3 (three) times a week.     . pantoprazole (PROTONIX) 40 MG tablet Take 40 mg by mouth every morning.    12/18/2015 at 0200  .  polyethylene glycol (MIRALAX / GLYCOLAX) packet Take 17 g by mouth daily as needed for mild constipation.   never  . HYDROcodone-acetaminophen (NORCO/VICODIN) 5-325 MG tablet Take 1-2 tablets by mouth every 6 (six) hours as needed for moderate pain. 20 tablet 0    Scheduled: . ALPRAZolam  1 mg Oral BID  . azithromycin  500 mg Oral Daily  . enoxaparin (LOVENOX) injection  60 mg Subcutaneous Q24H  . guaiFENesin  600 mg Oral BID  . imiquimod  Topical Once per day on Tue Thu Sat  . insulin aspart  0-20 Units Subcutaneous TID WC  . insulin aspart  0-5 Units Subcutaneous QHS  . insulin glargine  12 Units Subcutaneous Daily  . ipratropium-albuterol  3 mL Nebulization Q4H  . magic mouthwash  5 mL Oral TID PC & HS  . methylPREDNISolone (SOLU-MEDROL) injection  40 mg Intravenous Q12H  . mometasone-formoterol  2 puff Inhalation BID  . pantoprazole  40 mg Oral q morning - 10a  . potassium chloride  20 mEq Oral BID  . sodium chloride flush  3 mL Intravenous Q12H   Continuous: . cefTRIAXone (ROCEPHIN)  IV     GYB:[id number] **OR** acetaminophen, albuterol, HYDROcodone-acetaminophen, ondansetron **OR** ondansetron (ZOFRAN) IV, polyethylene glycol  Assesment: She was admitted with community-acquired pneumonia. She has COPD exacerbation. She has acute on chronic hypoxic respiratory failure. She has obstructive sleep apnea at baseline and is using her CPAP. She had a severe episode of pneumonia several years ago developed ARDS requiring intubation and mechanical ventilation and she's had to be back on the ventilator at least once since then. She is improving but it is very slow. I'm concerned that she may have some permanent lung damage from ARDS and it looks like that on chest x-ray. She is not ready for discharge Active Problems:   CAP (community acquired pneumonia)   Hypokalemia   Obesity, unspecified   Respiratory failure (HCC)   OSA (obstructive sleep apnea)   COPD exacerbation  ([place])    Plan: Continue current treatments increase insulin slightly    LOS: 5 days   Danyon Mcginness L 09/05/2016, 10:50 AM

## 2016-09-06 ENCOUNTER — Inpatient Hospital Stay (HOSPITAL_COMMUNITY): Payer: 59

## 2016-09-06 LAB — BASIC METABOLIC PANEL
Anion gap: 11 (ref 5–15)
BUN: 21 mg/dL — ABNORMAL HIGH (ref 6–20)
CALCIUM: 9.4 mg/dL (ref 8.9–10.3)
CO2: 31 mmol/L (ref 22–32)
CREATININE: 0.77 mg/dL (ref 0.44–1.00)
Chloride: 94 mmol/L — ABNORMAL LOW (ref 101–111)
GFR calc Af Amer: >60 mL/min (ref >60)
GLUCOSE: 186 mg/dL — AB (ref 65–99)
Potassium: 4.3 mmol/L (ref 3.5–5.1)
Sodium: 136 mmol/L (ref 135–145)

## 2016-09-06 LAB — CBC WITH DIFFERENTIAL/PLATELET
BASOS ABS: 0 10*3/uL (ref 0.0–0.1)
BASOS PCT: 0 %
EOS PCT: 0 %
Eosinophils Absolute: 0 10*3/uL (ref 0.0–0.7)
HCT: 40.2 % (ref 36.0–46.0)
Hemoglobin: 13.2 g/dL (ref 12.0–15.0)
Lymphocytes Relative: 13 %
Lymphs Abs: 2.2 10*3/uL (ref 0.7–4.0)
MCH: 29.2 pg (ref 26.0–34.0)
MCHC: 32.8 g/dL (ref 30.0–36.0)
MCV: 88.9 fL (ref 78.0–100.0)
MONO ABS: 0.8 10*3/uL (ref 0.1–1.0)
Monocytes Relative: 5 %
Neutro Abs: 13.4 10*3/uL — ABNORMAL HIGH (ref 1.7–7.7)
Neutrophils Relative %: 81 %
PLATELETS: 318 10*3/uL (ref 150–400)
RBC: 4.52 MIL/uL (ref 3.87–5.11)
RDW: 14.9 % (ref 11.5–15.5)
WBC: 16.5 10*3/uL — ABNORMAL HIGH (ref 4.0–10.5)

## 2016-09-06 LAB — GLUCOSE, CAPILLARY
GLUCOSE-CAPILLARY: 132 mg/dL — AB (ref 65–99)
Glucose-Capillary: 229 mg/dL — ABNORMAL HIGH (ref 65–99)
Glucose-Capillary: 158 mg/dL — ABNORMAL HIGH (ref 65–99)
Glucose-Capillary: 174 mg/dL — ABNORMAL HIGH (ref 65–99)

## 2016-09-06 LAB — CULTURE, RESPIRATORY: CULTURE: NORMAL

## 2016-09-06 LAB — CULTURE, RESPIRATORY W GRAM STAIN: Special Requests: NORMAL

## 2016-09-06 IMAGING — CT CT ANGIO CHEST
2 of 6 series · 19 of 36 positions shown · IV contrast (Isovue)
Comparison: Chest CT [DATE].

CLINICAL DATA: 45-year-old female with history of community
acquired pneumonia. History of ARDS. COPD exacerbation.

EXAM:
CT ANGIOGRAPHY CHEST WITH CONTRAST
TECHNIQUE: Multidetector CT imaging of the chest was performed using the
standard protocol during bolus administration of intravenous
contrast. Multiplanar CT image reconstructions and MIPs were
obtained to evaluate the vascular anatomy.
CONTRAST:  100 mL of Isovue 370.

[Series 5: thins · axial · 0.74mm/px · z∈[-435,-174]mm · 18 of 291 slices shown]
[im 15/291  lung]
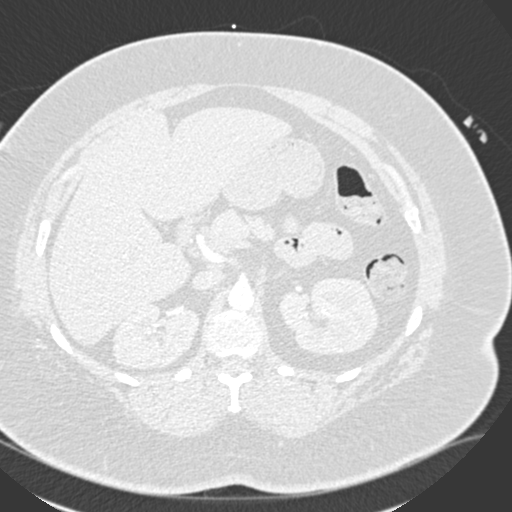
[im 30/291  mediastinal]
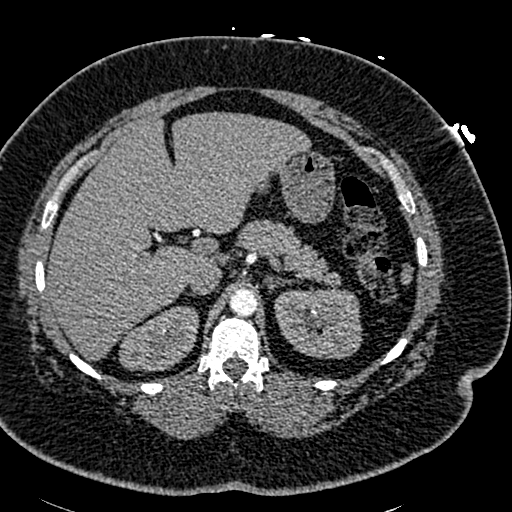
[im 44/291  lung]
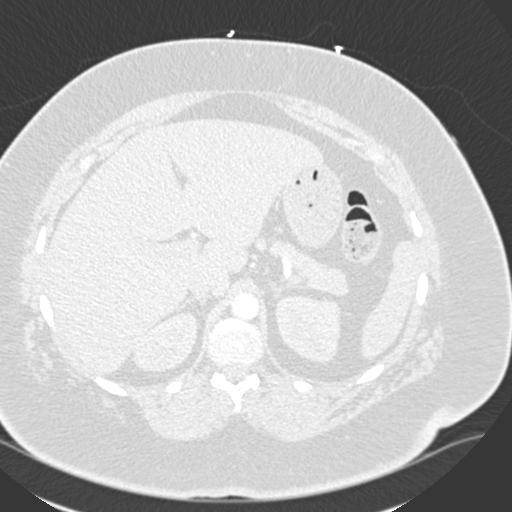
[im 59/291  mediastinal]
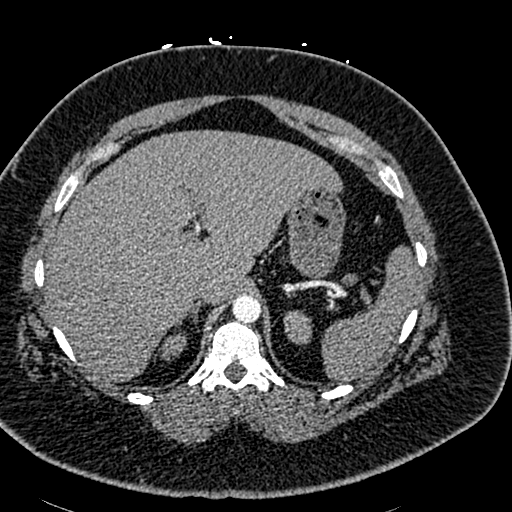
[im 73/291  lung]
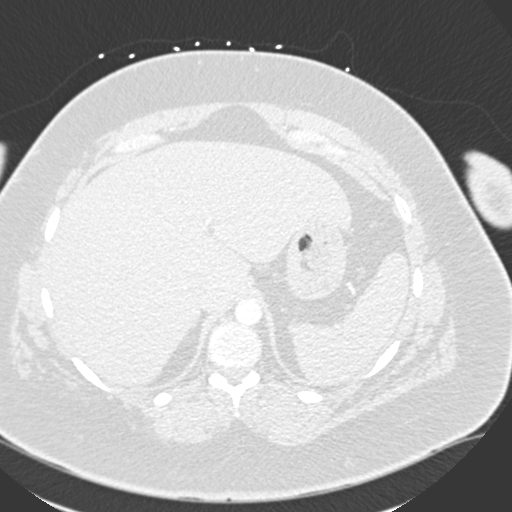
[im 88/291  mediastinal]
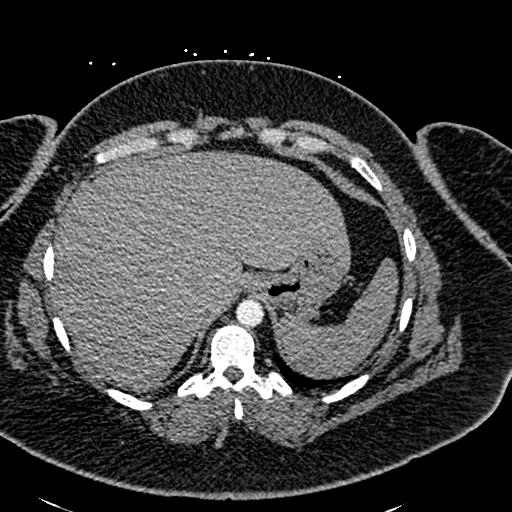
[im 102/291  lung]
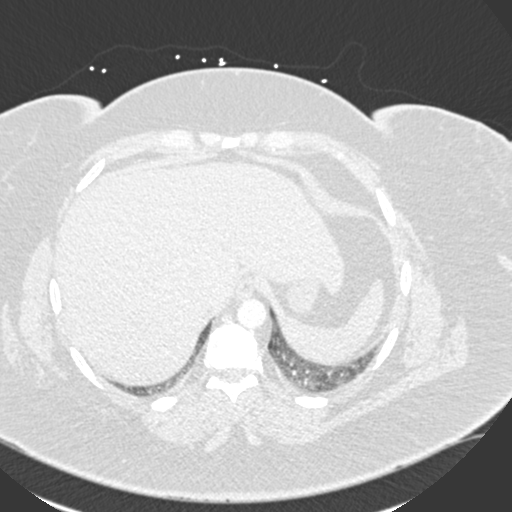
[im 117/291  mediastinal]
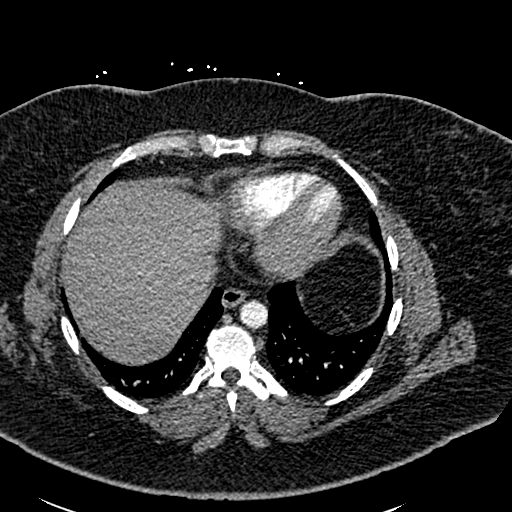
[im 131/291  lung]
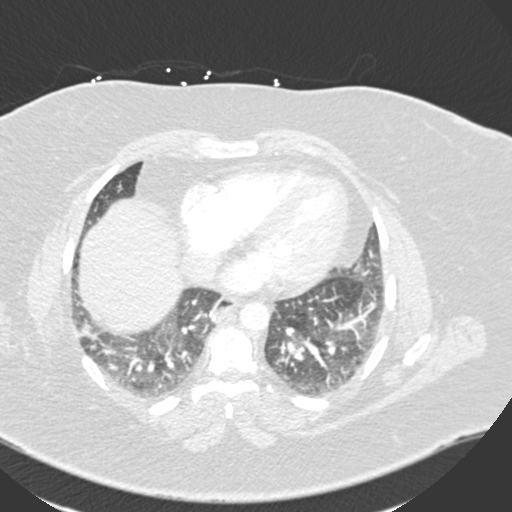
[im 160/291  mediastinal]
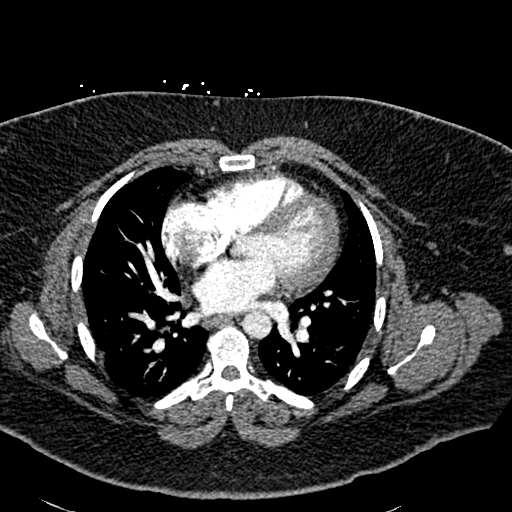
[im 175/291  lung]
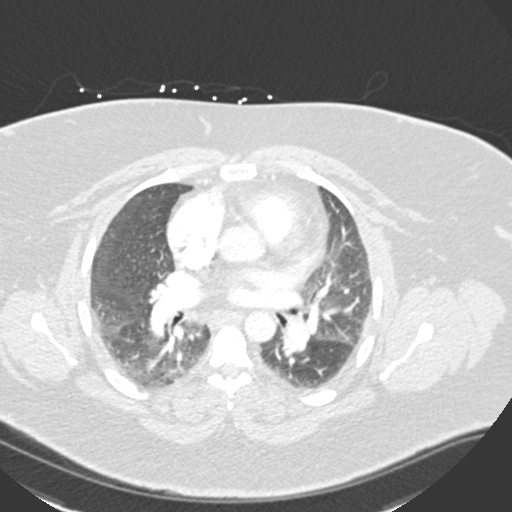
[im 189/291  mediastinal]
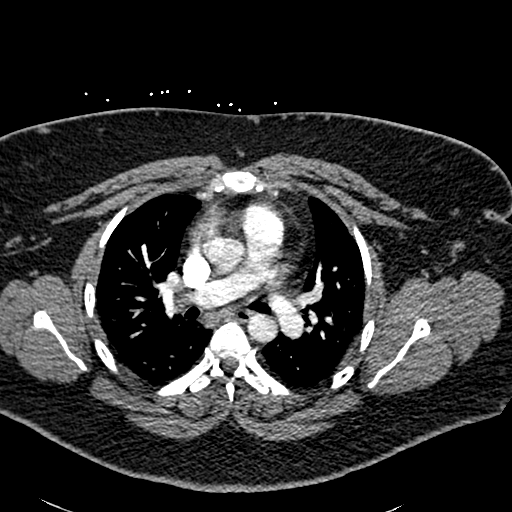
[im 204/291  lung]
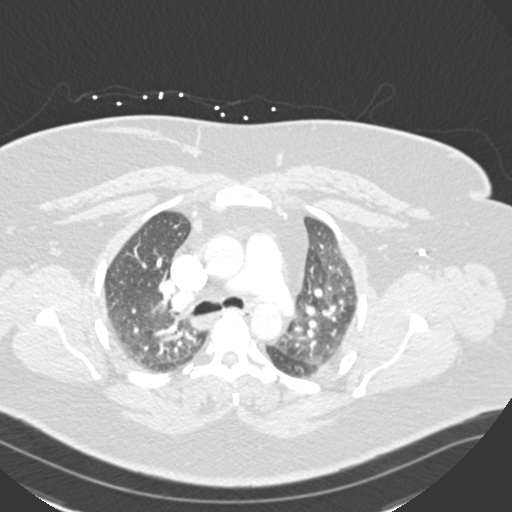
[im 218/291  mediastinal]
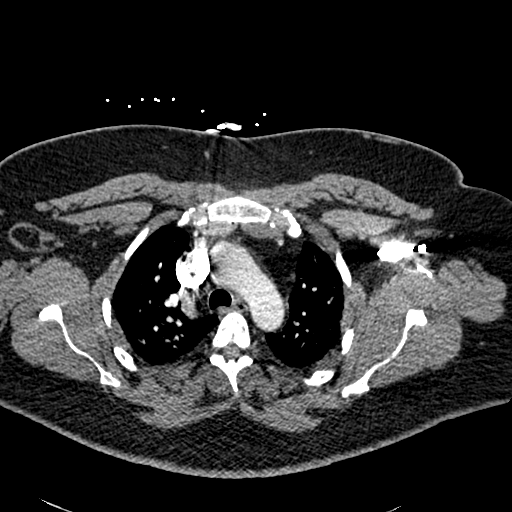
[im 233/291  lung]
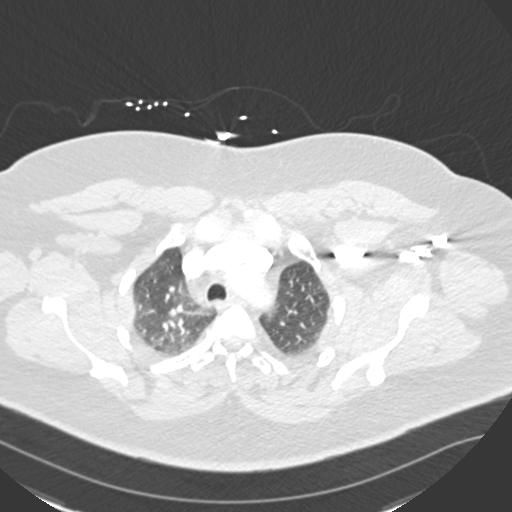
[im 247/291  mediastinal]
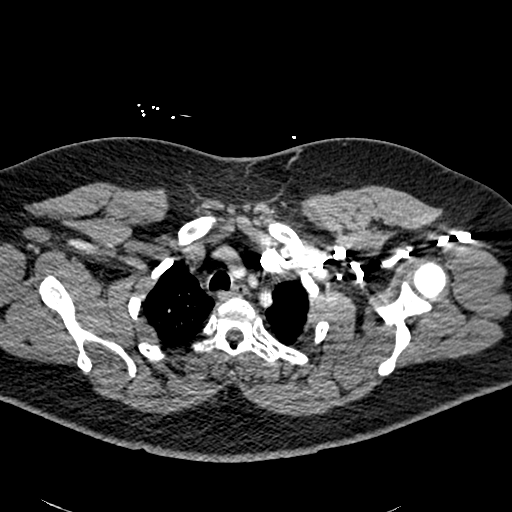
[im 262/291  lung]
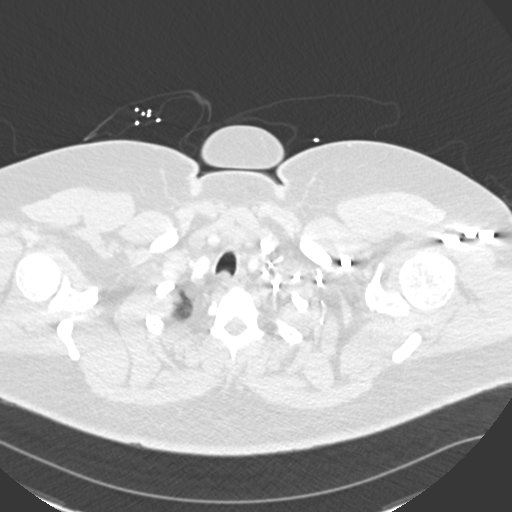
[im 276/291  mediastinal]
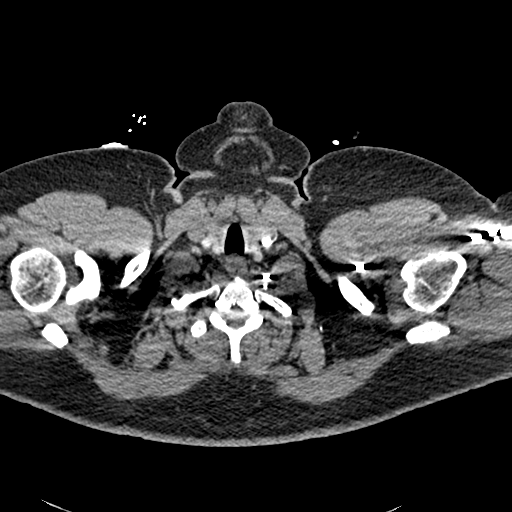

[Series 7: coronal mpr · coronal · 0.59mm/px · 1 of 151 slices shown]
[im 76/151  mediastinal]
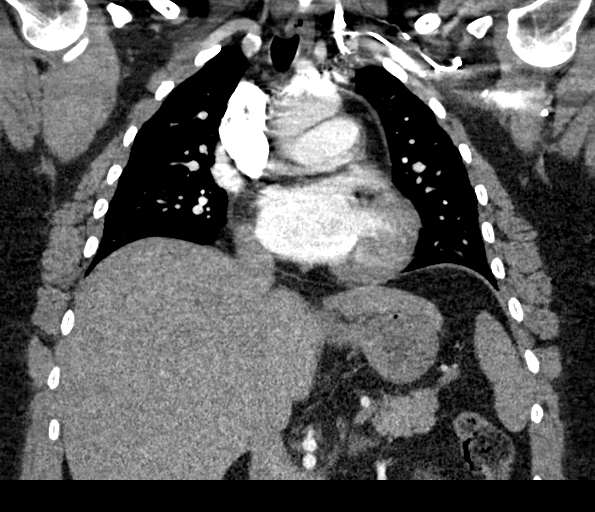

[19 of 36 positions shown; findings below may reference images not displayed]

FINDINGS: Cardiovascular: No filling defect within the pulmonary arterial tree
to suggest underlying pulmonary embolism. Heart size is mildly
enlarged. There is no significant pericardial fluid, thickening or
pericardial calcification. No significant atherosclerotic disease
noted in the thoracic aorta. No definite coronary artery
calcifications.

Mediastinum/Nodes: No pathologically enlarged mediastinal or hilar
lymph nodes. Esophagus is unremarkable in appearance. No axillary
lymphadenopathy.

Lungs/Pleura: Evidence of air trapping throughout the lungs
bilaterally. No acute consolidative airspace disease. No pleural
effusions. Linear scarring in the right lower lobe. No definite
suspicious appearing pulmonary nodules or masses.

Upper Abdomen: Unremarkable.

Musculoskeletal: There are no aggressive appearing lytic or blastic
lesions noted in the visualized portions of the skeleton.

Review of the MIP images confirms the above findings.
IMPRESSION: 1. No evidence pulmonary embolism.
2. Widespread air trapping in the lungs bilaterally, indicative of
small airways disease.
3. No other acute findings are noted.

## 2016-09-06 MED ORDER — IOPAMIDOL (ISOVUE-370) INJECTION 76%
100.0000 mL | Freq: Once | INTRAVENOUS | Status: AC | PRN
  Administered 2016-09-06: 100 mL via INTRAVENOUS

## 2016-09-06 NOTE — Progress Notes (Signed)
Subjective: She is still having shortness of breath . She had a severe episode of shortness of breath again last night. No chest pain.  Objective: Vital signs in last 24 hours: Temp:  [97.4 F (36.3 C)] 97.4 F (36.3 C) (04/22 0640) Pulse Rate:  [68-111] 68 (04/22 0640) Resp:  [18-21] 20 (04/22 0640) BP: (119-148)/(61-70) 119/61 (04/22 0640) SpO2:  [91 %-100 %] 100 % (04/22 0752) Weight:  [121.1 kg (266 lb 15.6 oz)] 121.1 kg (266 lb 15.6 oz) (04/22 0640) Weight change:  Last BM Date: 09/03/16  Intake/Output from previous day: 04/21 0701 - 04/22 0700 In: 200 [IV Piggyback:200] Out: 2650 [Urine:2650]  PHYSICAL EXAM General appearance: alert, cooperative, mild distress and morbidly obese Resp: clear to auscultation bilaterally Cardio: regular rate and rhythm, S1, S2 normal, no murmur, click, rub or gallop GI: soft, non-tender; bowel sounds normal; no masses,  no organomegaly Extremities: extremities normal, atraumatic, no cyanosis or edema skin:warm and dry  Lab Results:  Results for orders placed or performed during the hospital encounter of 08/31/16 (from the past 48 hour(s))  Glucose, capillary     Status: Abnormal   Collection Time: 09/04/16 11:31 AM  Result Value Ref Range   Glucose-Capillary 150 (H) 65 - 99 mg/dL   Comment 1 Notify RN    Comment 2 Document in Chart   Culture, sputum-assessment     Status: None   Collection Time: 09/04/16 12:04 PM  Result Value Ref Range   Specimen Description EXPECTORATED SPUTUM    Special Requests Normal    Sputum evaluation THIS SPECIMEN IS ACCEPTABLE FOR SPUTUM CULTURE    Report Status 09/04/2016 FINAL   Culture, respiratory (NON-Expectorated)     Status: None   Collection Time: 09/04/16 12:04 PM  Result Value Ref Range   Specimen Description EXPECTORATED SPUTUM    Special Requests Normal Reflexed from [id number]    Gram Stain      ABUNDANT WBC PRESENT,BOTH PMN AND MONONUCLEAR RARE GRAM POSITIVE COCCI IN PAIRS    Culture     Consistent with normal respiratory flora. Performed at [facility], [place] [address] [place], [place] [address]    Report Status 09/06/2016 FINAL   Glucose, capillary     Status: Abnormal   Collection Time: 09/04/16  3:53 PM  Result Value Ref Range   Glucose-Capillary 208 (H) 65 - 99 mg/dL   Comment 1 Notify RN    Comment 2 Document in Chart   Glucose, capillary     Status: Abnormal   Collection Time: 09/04/16 10:26 PM  Result Value Ref Range   Glucose-Capillary 159 (H) 65 - 99 mg/dL   Comment 1 Notify RN    Comment 2 Document in Chart   Glucose, capillary     Status: Abnormal   Collection Time: 09/05/16  7:44 AM  Result Value Ref Range   Glucose-Capillary 190 (H) 65 - 99 mg/dL  Glucose, capillary     Status: Abnormal   Collection Time: 09/05/16 12:18 PM  Result Value Ref Range   Glucose-Capillary 141 (H) 65 - 99 mg/dL  Glucose, capillary     Status: Abnormal   Collection Time: 09/05/16  5:10 PM  Result Value Ref Range   Glucose-Capillary 161 (H) 65 - 99 mg/dL  Glucose, capillary     Status: Abnormal   Collection Time: 09/05/16 10:02 PM  Result Value Ref Range   Glucose-Capillary 173 (H) 65 - 99 mg/dL  Basic metabolic panel     Status: Abnormal  Collection Time: 09/06/16  6:40 AM  Result Value Ref Range   Sodium 136 135 - 145 mmol/L   Potassium 4.3 3.5 - 5.1 mmol/L   Chloride 94 (L) 101 - 111 mmol/L   CO2 31 22 - 32 mmol/L   Glucose, Bld 186 (H) 65 - 99 mg/dL   BUN 21 (H) 6 - 20 mg/dL   Creatinine, Ser 0.77 0.44 - 1.00 mg/dL   Calcium 9.4 8.9 - 10.3 mg/dL   GFR calc non Af Amer >60 >60 mL/min   GFR calc Af Amer >60 >60 mL/min    Comment: (NOTE) The eGFR has been calculated using the CKD EPI equation. This calculation has not been validated in all clinical situations. eGFR's persistently <60 mL/min signify possible Chronic Kidney Disease.    Anion gap 11 5 - 15  CBC with Differential/Platelet     Status: Abnormal   Collection Time: 09/06/16  6:40 AM  Result  Value Ref Range   WBC 16.5 (H) 4.0 - 10.5 K/uL   RBC 4.52 3.87 - 5.11 MIL/uL   Hemoglobin 13.2 12.0 - 15.0 g/dL   HCT 40.2 36.0 - 46.0 %   MCV 88.9 78.0 - 100.0 fL   MCH 29.2 26.0 - 34.0 pg   MCHC 32.8 30.0 - 36.0 g/dL   RDW 14.9 11.5 - 15.5 %   Platelets 318 150 - 400 K/uL   Neutrophils Relative % 81 %   Neutro Abs 13.4 (H) 1.7 - 7.7 K/uL   Lymphocytes Relative 13 %   Lymphs Abs 2.2 0.7 - 4.0 K/uL   Monocytes Relative 5 %   Monocytes Absolute 0.8 0.1 - 1.0 K/uL   Eosinophils Relative 0 %   Eosinophils Absolute 0.0 0.0 - 0.7 K/uL   Basophils Relative 0 %   Basophils Absolute 0.0 0.0 - 0.1 K/uL  Glucose, capillary     Status: Abnormal   Collection Time: 09/06/16  7:52 AM  Result Value Ref Range   Glucose-Capillary 229 (H) 65 - 99 mg/dL   Comment 1 Notify RN    Comment 2 Document in Chart     ABGS No results for input(s): PHART, PO2ART, TCO2, HCO3 in the last 72 hours.  Invalid input(s): PCO2 CULTURES Recent Results (from the past 240 hour(s))  MRSA PCR Screening     Status: None   Collection Time: 08/31/16  3:31 PM  Result Value Ref Range Status   MRSA by PCR NEGATIVE NEGATIVE Final    Comment:        The GeneXpert MRSA Assay (FDA approved for NASAL specimens only), is one component of a comprehensive MRSA colonization surveillance program. It is not intended to diagnose MRSA infection nor to guide or monitor treatment for MRSA infections.   Culture, sputum-assessment     Status: None   Collection Time: 09/04/16 12:04 PM  Result Value Ref Range Status   Specimen Description EXPECTORATED SPUTUM  Final   Special Requests Normal  Final   Sputum evaluation THIS SPECIMEN IS ACCEPTABLE FOR SPUTUM CULTURE  Final   Report Status 09/04/2016 FINAL  Final  Culture, respiratory (NON-Expectorated)     Status: None   Collection Time: 09/04/16 12:04 PM  Result Value Ref Range Status   Specimen Description EXPECTORATED SPUTUM  Final   Special Requests Normal Reflexed from  [id number]  Final   Gram Stain   Final    ABUNDANT WBC PRESENT,BOTH PMN AND MONONUCLEAR RARE GRAM POSITIVE COCCI IN PAIRS    Culture  Final    Consistent with normal respiratory flora. Performed at [facility], [place] [address], [place], [place] [address]    Report Status 09/06/2016 FINAL  Final   Studies/Results: No results found.  Medications:  Prior to Admission:  Prescriptions Prior to Admission  Medication Sig Dispense Refill Last Dose  . albuterol (PROVENTIL HFA;VENTOLIN HFA) 108 (90 BASE) MCG/ACT inhaler Inhale 2 puffs into the lungs every 6 (six) hours as needed. wheezing 1 Inhaler 11 Past Week at Unknown time  . albuterol (PROVENTIL) (5 MG/ML) 0.5% nebulizer solution Take 2.5 mg by nebulization every 6 (six) hours as needed for wheezing or shortness of breath.   More than a month at Unknown time  . ALPRAZolam (XANAX) 1 MG tablet Take 1 mg by mouth 2 (two) times daily.   12/18/2015 at 0200  . HYDROcodone-acetaminophen (NORCO) 10-325 MG tablet Take 1 tablet by mouth every 6 (six) hours.  0   . imiquimod (ALDARA) 5 % cream Apply 1 application topically 3 (three) times a week.     . pantoprazole (PROTONIX) 40 MG tablet Take 40 mg by mouth every morning.    12/18/2015 at 0200  . polyethylene glycol (MIRALAX / GLYCOLAX) packet Take 17 g by mouth daily as needed for mild constipation.   never  . HYDROcodone-acetaminophen (NORCO/VICODIN) 5-325 MG tablet Take 1-2 tablets by mouth every 6 (six) hours as needed for moderate pain. 20 tablet 0    Scheduled: . ALPRAZolam  1 mg Oral BID  . azithromycin  500 mg Oral Daily  . enoxaparin (LOVENOX) injection  60 mg Subcutaneous Q24H  . guaiFENesin  600 mg Oral BID  . imiquimod   Topical Once per day on Tue Thu Sat  . insulin aspart  0-20 Units Subcutaneous TID WC  . insulin aspart  0-5 Units Subcutaneous QHS  . insulin glargine  12 Units Subcutaneous Daily  . ipratropium-albuterol  3 mL Nebulization Q4H  . magic mouthwash  5 mL Oral TID PC &  HS  . methylPREDNISolone (SOLU-MEDROL) injection  40 mg Intravenous Q12H  . mometasone-formoterol  2 puff Inhalation BID  . pantoprazole  40 mg Oral q morning - 10a  . potassium chloride  20 mEq Oral BID  . sodium chloride flush  3 mL Intravenous Q12H   Continuous: . cefTRIAXone (ROCEPHIN)  IV     GLO:VFIEPPIRJJOAC **OR** acetaminophen, albuterol, HYDROcodone-acetaminophen, ondansetron **OR** ondansetron (ZOFRAN) IV, polyethylene glycol  Assesment:she was admitted with CAP . She is better but not well . Still episodes of shortness of breath. She has acute on chronic hypoxic respiratory failure. She has obstructive sleep apnea and is doing much better with her CPAP. She has COPD at baseline. I'm concerned that she may have some interstitial lung disease or that she may have pulmonary emboli because it is taking her a very long time to improve. Active Problems:   CAP (community acquired pneumonia)   Hypokalemia   Obesity, unspecified   Respiratory failure (HCC)   OSA (obstructive sleep apnea)   COPD exacerbation (HCC)    Plan: I'm going to do CT angiogram of her chest. It's not ideal for interstitial lung disease but I think it will give [place] some information and also tell us whether she has pulmonary emboli are not.    LOS: 6 days   Laiklyn Pilkenton L 09/06/2016, 9:56 AM

## 2016-09-07 LAB — GLUCOSE, CAPILLARY
GLUCOSE-CAPILLARY: 159 mg/dL — AB (ref 65–99)
GLUCOSE-CAPILLARY: 160 mg/dL — AB (ref 65–99)
Glucose-Capillary: 184 mg/dL — ABNORMAL HIGH (ref 65–99)
Glucose-Capillary: 165 mg/dL — ABNORMAL HIGH (ref 65–99)

## 2016-09-07 LAB — CREATININE, SERUM: Creatinine, Ser: 0.66 mg/dL (ref 0.44–1.00)

## 2016-09-07 MED ORDER — METHYLPREDNISOLONE SODIUM SUCC 125 MG IJ SOLR
60.0000 mg | Freq: Four times a day (QID) | INTRAMUSCULAR | Status: DC
Start: 2016-09-07 — End: 2016-09-09
  Administered 2016-09-07 – 2016-09-09 (×8): 60 mg via INTRAVENOUS
  Filled 2016-09-07 (×8): qty 2

## 2016-09-07 MED ORDER — INSULIN GLARGINE 100 UNIT/ML ~~LOC~~ SOLN
15.0000 [IU] | Freq: Every day | SUBCUTANEOUS | Status: DC
  Administered 2016-09-07 – 2016-09-10 (×4): 15 [IU] via SUBCUTANEOUS
  Filled 2016-09-07 (×7): qty 0.15

## 2016-09-07 NOTE — Progress Notes (Signed)
Subjective: She says she still feels short of breath. No chest pain. No nausea or vomiting. Her throat is doing better. She is coughing mostly nonproductively.  Objective: Vital signs in last 24 hours: Temp:  [97.6 F (36.4 C)-98.2 F (36.8 C)] 97.6 F (36.4 C) (04/23 0517) Pulse Rate:  [72-90] 90 (04/23 0517) Resp:  [20-22] 22 (04/23 0517) BP: (116-120)/(66-76) 116/76 (04/23 0517) SpO2:  [93 %-98 %] 98 % (04/23 0517) FiO2 (%):  [95 %] 95 % (04/23 0759) Weight:  [119.4 kg (263 lb 3.2 oz)] 119.4 kg (263 lb 3.2 oz) (04/23 0517) Weight change: -1.713 kg (-3 lb 12.4 oz) Last BM Date: 09/03/16  Intake/Output from previous day: 04/22 0701 - 04/23 0700 In: 480 [P.O.:480] Out: 800 [Urine:800]  PHYSICAL EXAM General appearance: alert, cooperative, mild distress and morbidly obese Resp: Bilateral wheezing and rhonchi still Cardio: regular rate and rhythm, S1, S2 normal, no murmur, click, rub or gallop GI: soft, non-tender; bowel sounds normal; no masses,  no organomegaly Extremities: extremities normal, atraumatic, no cyanosis or edema Skin warm and dry.  Lab Results:  Results for orders placed or performed during the hospital encounter of 08/31/16 (from the past 48 hour(s))  Glucose, capillary     Status: Abnormal   Collection Time: 09/05/16 12:18 PM  Result Value Ref Range   Glucose-Capillary 141 (H) 65 - 99 mg/dL  Glucose, capillary     Status: Abnormal   Collection Time: 09/05/16  5:10 PM  Result Value Ref Range   Glucose-Capillary 161 (H) 65 - 99 mg/dL  Glucose, capillary     Status: Abnormal   Collection Time: 09/05/16 10:02 PM  Result Value Ref Range   Glucose-Capillary 173 (H) 65 - 99 mg/dL  Basic metabolic panel     Status: Abnormal   Collection Time: 09/06/16  6:40 AM  Result Value Ref Range   Sodium 136 135 - 145 mmol/L   Potassium 4.3 3.5 - 5.1 mmol/L   Chloride 94 (L) 101 - 111 mmol/L   CO2 31 22 - 32 mmol/L   Glucose, Bld 186 (H) 65 - 99 mg/dL   BUN 21 (H) 6  - 20 mg/dL   Creatinine, Ser 0.77 0.44 - 1.00 mg/dL   Calcium 9.4 8.9 - 10.3 mg/dL   GFR calc non Af Amer >60 >60 mL/min   GFR calc Af Amer >60 >60 mL/min    Comment: (NOTE) The eGFR has been calculated using the CKD EPI equation. This calculation has not been validated in all clinical situations. eGFR's persistently <60 mL/min signify possible Chronic Kidney Disease.    Anion gap 11 5 - 15  CBC with Differential/Platelet     Status: Abnormal   Collection Time: 09/06/16  6:40 AM  Result Value Ref Range   WBC 16.5 (H) 4.0 - 10.5 K/uL   RBC 4.52 3.87 - 5.11 MIL/uL   Hemoglobin 13.2 12.0 - 15.0 g/dL   HCT 40.2 36.0 - 46.0 %   MCV 88.9 78.0 - 100.0 fL   MCH 29.2 26.0 - 34.0 pg   MCHC 32.8 30.0 - 36.0 g/dL   RDW 14.9 11.5 - 15.5 %   Platelets 318 150 - 400 K/uL   Neutrophils Relative % 81 %   Neutro Abs 13.4 (H) 1.7 - 7.7 K/uL   Lymphocytes Relative 13 %   Lymphs Abs 2.2 0.7 - 4.0 K/uL   Monocytes Relative 5 %   Monocytes Absolute 0.8 0.1 - 1.0 K/uL   Eosinophils Relative 0 %  Eosinophils Absolute 0.0 0.0 - 0.7 K/uL   Basophils Relative 0 %   Basophils Absolute 0.0 0.0 - 0.1 K/uL  Glucose, capillary     Status: Abnormal   Collection Time: 09/06/16  7:52 AM  Result Value Ref Range   Glucose-Capillary 229 (H) 65 - 99 mg/dL   Comment 1 Notify RN    Comment 2 Document in Chart   Glucose, capillary     Status: Abnormal   Collection Time: 09/06/16 11:58 AM  Result Value Ref Range   Glucose-Capillary 158 (H) 65 - 99 mg/dL   Comment 1 Notify RN    Comment 2 Document in Chart   Glucose, capillary     Status: Abnormal   Collection Time: 09/06/16  4:26 PM  Result Value Ref Range   Glucose-Capillary 132 (H) 65 - 99 mg/dL   Comment 1 Notify RN    Comment 2 Document in Chart   Glucose, capillary     Status: Abnormal   Collection Time: 09/06/16  9:20 PM  Result Value Ref Range   Glucose-Capillary 174 (H) 65 - 99 mg/dL   Comment 1 Notify RN    Comment 2 Document in Chart    Creatinine, serum     Status: None   Collection Time: 09/07/16  4:52 AM  Result Value Ref Range   Creatinine, Ser 0.66 0.44 - 1.00 mg/dL   GFR calc non Af Amer >60 >60 mL/min   GFR calc Af Amer >60 >60 mL/min    Comment: (NOTE) The eGFR has been calculated using the CKD EPI equation. This calculation has not been validated in all clinical situations. eGFR's persistently <60 mL/min signify possible Chronic Kidney Disease.   Glucose, capillary     Status: Abnormal   Collection Time: 09/07/16  7:41 AM  Result Value Ref Range   Glucose-Capillary 184 (H) 65 - 99 mg/dL    ABGS No results for input(s): PHART, PO2ART, TCO2, HCO3 in the last 72 hours.  Invalid input(s): PCO2 CULTURES Recent Results (from the past 240 hour(s))  MRSA PCR Screening     Status: None   Collection Time: 08/31/16  3:31 PM  Result Value Ref Range Status   MRSA by PCR NEGATIVE NEGATIVE Final    Comment:        The GeneXpert MRSA Assay (FDA approved for NASAL specimens only), is one component of a comprehensive MRSA colonization surveillance program. It is not intended to diagnose MRSA infection nor to guide or monitor treatment for MRSA infections.   Culture, sputum-assessment     Status: None   Collection Time: 09/04/16 12:04 PM  Result Value Ref Range Status   Specimen Description EXPECTORATED SPUTUM  Final   Special Requests Normal  Final   Sputum evaluation THIS SPECIMEN IS ACCEPTABLE FOR SPUTUM CULTURE  Final   Report Status 09/04/2016 FINAL  Final  Culture, respiratory (NON-Expectorated)     Status: None   Collection Time: 09/04/16 12:04 PM  Result Value Ref Range Status   Specimen Description EXPECTORATED SPUTUM  Final   Special Requests Normal Reflexed from [id number]  Final   Gram Stain   Final    ABUNDANT WBC PRESENT,BOTH PMN AND MONONUCLEAR RARE GRAM POSITIVE COCCI IN PAIRS    Culture   Final    Consistent with normal respiratory flora. Performed at [facility], [place] [address] [place], [place] [address]    Report Status 09/06/2016 FINAL  Final   Studies/Results: Ct Angio Chest Pe W Or  Wo Contrast  Result Date: 09/06/2016 CLINICAL DATA:  46 year old female with history of community acquired pneumonia. History of ARDS. COPD exacerbation. EXAM: CT ANGIOGRAPHY CHEST WITH CONTRAST TECHNIQUE: Multidetector CT imaging of the chest was performed using the standard protocol during bolus administration of intravenous contrast. Multiplanar CT image reconstructions and MIPs were obtained to evaluate the vascular anatomy. CONTRAST:  100 mL of Isovue 370. COMPARISON:  Chest CT 05/07/2015. FINDINGS: Cardiovascular: No filling defect within the pulmonary arterial tree to suggest underlying pulmonary embolism. Heart size is mildly enlarged. There is no significant pericardial fluid, thickening or pericardial calcification. No significant atherosclerotic disease noted in the thoracic aorta. No definite coronary artery calcifications. Mediastinum/Nodes: No pathologically enlarged mediastinal or hilar lymph nodes. Esophagus is unremarkable in appearance. No axillary lymphadenopathy. Lungs/Pleura: Evidence of air trapping throughout the lungs bilaterally. No acute consolidative airspace disease. No pleural effusions. Linear scarring in the right lower lobe. No definite suspicious appearing pulmonary nodules or masses. Upper Abdomen: Unremarkable. Musculoskeletal: There are no aggressive appearing lytic or blastic lesions noted in the visualized portions of the skeleton. Review of the MIP images confirms the above findings. IMPRESSION: 1. No evidence pulmonary embolism. 2. Widespread air trapping in the lungs bilaterally, indicative of small airways disease. 3. No other acute findings are noted. Electronically Signed   By: Vinnie Langton M.D.   On: 09/06/2016 15:46    Medications:  Prior to Admission:  Prescriptions Prior to Admission  Medication Sig Dispense Refill Last Dose  . albuterol  (PROVENTIL HFA;VENTOLIN HFA) 108 (90 BASE) MCG/ACT inhaler Inhale 2 puffs into the lungs every 6 (six) hours as needed. wheezing 1 Inhaler 11 Past Week at Unknown time  . albuterol (PROVENTIL) (5 MG/ML) 0.5% nebulizer solution Take 2.5 mg by nebulization every 6 (six) hours as needed for wheezing or shortness of breath.   More than a month at Unknown time  . ALPRAZolam (XANAX) 1 MG tablet Take 1 mg by mouth 2 (two) times daily.   12/18/2015 at 0200  . HYDROcodone-acetaminophen (NORCO) 10-325 MG tablet Take 1 tablet by mouth every 6 (six) hours.  0   . imiquimod (ALDARA) 5 % cream Apply 1 application topically 3 (three) times a week.     . pantoprazole (PROTONIX) 40 MG tablet Take 40 mg by mouth every morning.    12/18/2015 at 0200  . polyethylene glycol (MIRALAX / GLYCOLAX) packet Take 17 g by mouth daily as needed for mild constipation.   never  . HYDROcodone-acetaminophen (NORCO/VICODIN) 5-325 MG tablet Take 1-2 tablets by mouth every 6 (six) hours as needed for moderate pain. 20 tablet 0    Scheduled: . ALPRAZolam  1 mg Oral BID  . azithromycin  500 mg Oral Daily  . enoxaparin (LOVENOX) injection  60 mg Subcutaneous Q24H  . guaiFENesin  600 mg Oral BID  . imiquimod   Topical Once per day on Tue Thu Sat  . insulin aspart  0-20 Units Subcutaneous TID WC  . insulin aspart  0-5 Units Subcutaneous QHS  . insulin glargine  15 Units Subcutaneous Daily  . ipratropium-albuterol  3 mL Nebulization Q4H  . magic mouthwash  5 mL Oral TID PC & HS  . methylPREDNISolone (SOLU-MEDROL) injection  60 mg Intravenous Q6H  . mometasone-formoterol  2 puff Inhalation BID  . pantoprazole  40 mg Oral q morning - 10a  . potassium chloride  20 mEq Oral BID  . sodium chloride flush  3 mL Intravenous Q12H   Continuous: . cefTRIAXone (ROCEPHIN)  IV     [id number] **OR** acetaminophen, albuterol, HYDROcodone-acetaminophen, ondansetron **OR** ondansetron (ZOFRAN) IV, polyethylene glycol  Assesment: She is  admitted with community-acquired pneumonia acute on chronic hypoxic respiratory failure COPD exacerbation. She had CT done yesterday of her chest because she has been slow to progress. I have personally reviewed this. She does not have clotting. She does not have significant interstitial lung disease. She does have significant air trapping still despite inhaled bronchodilators antibiotics and steroids.  She has diabetes and her blood sugar has been up but that is better. Active Problems:   CAP (community acquired pneumonia)   Hypokalemia   Obesity, unspecified   Respiratory failure (HCC)   OSA (obstructive sleep apnea)   COPD exacerbation (HCC)    Plan: I increased her steroids. I also increased her basal insulin. She is still having significant issues and is not ready for discharge    LOS: 7 days   Ilia Dimaano L 09/07/2016, 8:43 AM

## 2016-09-08 LAB — GLUCOSE, CAPILLARY
GLUCOSE-CAPILLARY: 185 mg/dL — AB (ref 65–99)
GLUCOSE-CAPILLARY: 185 mg/dL — AB (ref 65–99)
Glucose-Capillary: 204 mg/dL — ABNORMAL HIGH (ref 65–99)
Glucose-Capillary: 191 mg/dL — ABNORMAL HIGH (ref 65–99)

## 2016-09-08 MED ORDER — FUROSEMIDE 10 MG/ML IJ SOLN
40.0000 mg | Freq: Once | INTRAMUSCULAR | Status: AC
  Administered 2016-09-08: 40 mg via INTRAVENOUS
  Filled 2016-09-08: qty 4

## 2016-09-08 MED ORDER — POTASSIUM CHLORIDE CRYS ER 20 MEQ PO TBCR
20.0000 meq | EXTENDED_RELEASE_TABLET | Freq: Once | ORAL | Status: AC
  Administered 2016-09-08: 20 meq via ORAL
  Filled 2016-09-08: qty 1

## 2016-09-08 NOTE — Progress Notes (Signed)
Subjective: She feels better today. She is having side effects from the increased dose of steroids. She says that she's irritable and is having a headache. However her breathing has improved substantially. She has also had some fluid retention. She has no other new complaints. Her cough is better. No chest pain. No nausea or vomiting.  Objective: Vital signs in last 24 hours: Temp:  [97.6 F (36.4 C)-97.8 F (36.6 C)] 97.7 F (36.5 C) (04/24 0522) Pulse Rate:  [71-76] 71 (04/24 0522) Resp:  [16-18] 18 (04/24 0522) BP: (108-130)/(56-86) 108/56 (04/24 0522) SpO2:  [94 %-98 %] 97 % (04/24 0743) Weight:  [118 kg (260 lb 1.6 oz)] 118 kg (260 lb 1.6 oz) (04/24 0522) Weight change: -1.406 kg (-3 lb 1.6 oz) Last BM Date: 09/06/16  Intake/Output from previous day: 04/23 0701 - 04/24 0700 In: 340 [P.O.:240; IV Piggyback:100] Out: 4450 [Urine:4450]  PHYSICAL EXAM General appearance: alert, cooperative, mild distress and morbidly obese Resp: Her chest is much clearer. She has prolonged expiratory phase and she's coughing after a nebulizer treatment Cardio: regular rate and rhythm, S1, S2 normal, no murmur, click, rub or gallop GI: soft, non-tender; bowel sounds normal; no masses,  no organomegaly Extremities: Trace to 1+ edema bilaterally Skin warm and dry  Lab Results:  Results for orders placed or performed during the hospital encounter of 08/31/16 (from the past 48 hour(s))  Glucose, capillary     Status: Abnormal   Collection Time: 09/06/16 11:58 AM  Result Value Ref Range   Glucose-Capillary 158 (H) 65 - 99 mg/dL   Comment 1 Notify RN    Comment 2 Document in Chart   Glucose, capillary     Status: Abnormal   Collection Time: 09/06/16  4:26 PM  Result Value Ref Range   Glucose-Capillary 132 (H) 65 - 99 mg/dL   Comment 1 Notify RN    Comment 2 Document in Chart   Glucose, capillary     Status: Abnormal   Collection Time: 09/06/16  9:20 PM  Result Value Ref Range    Glucose-Capillary 174 (H) 65 - 99 mg/dL   Comment 1 Notify RN    Comment 2 Document in Chart   Creatinine, serum     Status: None   Collection Time: 09/07/16  4:52 AM  Result Value Ref Range   Creatinine, Ser 0.66 0.44 - 1.00 mg/dL   GFR calc non Af Amer >60 >60 mL/min   GFR calc Af Amer >60 >60 mL/min    Comment: (NOTE) The eGFR has been calculated using the CKD EPI equation. This calculation has not been validated in all clinical situations. eGFR's persistently <60 mL/min signify possible Chronic Kidney Disease.   Glucose, capillary     Status: Abnormal   Collection Time: 09/07/16  7:41 AM  Result Value Ref Range   Glucose-Capillary 184 (H) 65 - 99 mg/dL  Glucose, capillary     Status: Abnormal   Collection Time: 09/07/16 11:16 AM  Result Value Ref Range   Glucose-Capillary 159 (H) 65 - 99 mg/dL  Glucose, capillary     Status: Abnormal   Collection Time: 09/07/16  4:14 PM  Result Value Ref Range   Glucose-Capillary 160 (H) 65 - 99 mg/dL  Glucose, capillary     Status: Abnormal   Collection Time: 09/07/16 10:12 PM  Result Value Ref Range   Glucose-Capillary 165 (H) 65 - 99 mg/dL   Comment 1 Notify RN    Comment 2 Document in Chart   Glucose,  capillary     Status: Abnormal   Collection Time: 09/08/16  8:07 AM  Result Value Ref Range   Glucose-Capillary 204 (H) 65 - 99 mg/dL    ABGS No results for input(s): PHART, PO2ART, TCO2, HCO3 in the last 72 hours.  Invalid input(s): PCO2 CULTURES Recent Results (from the past 240 hour(s))  MRSA PCR Screening     Status: None   Collection Time: 08/31/16  3:31 PM  Result Value Ref Range Status   MRSA by PCR NEGATIVE NEGATIVE Final    Comment:        The GeneXpert MRSA Assay (FDA approved for NASAL specimens only), is one component of a comprehensive MRSA colonization surveillance program. It is not intended to diagnose MRSA infection nor to guide or monitor treatment for MRSA infections.   Culture, sputum-assessment      Status: None   Collection Time: 09/04/16 12:04 PM  Result Value Ref Range Status   Specimen Description EXPECTORATED SPUTUM  Final   Special Requests Normal  Final   Sputum evaluation THIS SPECIMEN IS ACCEPTABLE FOR SPUTUM CULTURE  Final   Report Status 09/04/2016 FINAL  Final  Culture, respiratory (NON-Expectorated)     Status: None   Collection Time: 09/04/16 12:04 PM  Result Value Ref Range Status   Specimen Description EXPECTORATED SPUTUM  Final   Special Requests Normal Reflexed from [id number]  Final   Gram Stain   Final    ABUNDANT WBC PRESENT,BOTH PMN AND MONONUCLEAR RARE GRAM POSITIVE COCCI IN PAIRS    Culture   Final    Consistent with normal respiratory flora. Performed at [facility], [place] [address], [place], [place] [address]    Report Status 09/06/2016 FINAL  Final   Studies/Results: Ct Angio Chest Pe W Or Wo Contrast  Result Date: 09/06/2016 CLINICAL DATA:  46 year old female with history of community acquired pneumonia. History of ARDS. COPD exacerbation. EXAM: CT ANGIOGRAPHY CHEST WITH CONTRAST TECHNIQUE: Multidetector CT imaging of the chest was performed using the standard protocol during bolus administration of intravenous contrast. Multiplanar CT image reconstructions and MIPs were obtained to evaluate the vascular anatomy. CONTRAST:  100 mL of Isovue 370. COMPARISON:  Chest CT 05/07/2015. FINDINGS: Cardiovascular: No filling defect within the pulmonary arterial tree to suggest underlying pulmonary embolism. Heart size is mildly enlarged. There is no significant pericardial fluid, thickening or pericardial calcification. No significant atherosclerotic disease noted in the thoracic aorta. No definite coronary artery calcifications. Mediastinum/Nodes: No pathologically enlarged mediastinal or hilar lymph nodes. Esophagus is unremarkable in appearance. No axillary lymphadenopathy. Lungs/Pleura: Evidence of air trapping throughout the lungs bilaterally. No acute  consolidative airspace disease. No pleural effusions. Linear scarring in the right lower lobe. No definite suspicious appearing pulmonary nodules or masses. Upper Abdomen: Unremarkable. Musculoskeletal: There are no aggressive appearing lytic or blastic lesions noted in the visualized portions of the skeleton. Review of the MIP images confirms the above findings. IMPRESSION: 1. No evidence pulmonary embolism. 2. Widespread air trapping in the lungs bilaterally, indicative of small airways disease. 3. No other acute findings are noted. Electronically Signed   By: Vinnie Langton M.D.   On: 09/06/2016 15:46    Medications:  Prior to Admission:  Prescriptions Prior to Admission  Medication Sig Dispense Refill Last Dose  . albuterol (PROVENTIL HFA;VENTOLIN HFA) 108 (90 BASE) MCG/ACT inhaler Inhale 2 puffs into the lungs every 6 (six) hours as needed. wheezing 1 Inhaler 11 Past Week at Unknown time  . albuterol (PROVENTIL) (5  MG/ML) 0.5% nebulizer solution Take 2.5 mg by nebulization every 6 (six) hours as needed for wheezing or shortness of breath.   More than a month at Unknown time  . ALPRAZolam (XANAX) 1 MG tablet Take 1 mg by mouth 2 (two) times daily.   12/18/2015 at 0200  . HYDROcodone-acetaminophen (NORCO) 10-325 MG tablet Take 1 tablet by mouth every 6 (six) hours.  0   . imiquimod (ALDARA) 5 % cream Apply 1 application topically 3 (three) times a week.     . pantoprazole (PROTONIX) 40 MG tablet Take 40 mg by mouth every morning.    12/18/2015 at 0200  . polyethylene glycol (MIRALAX / GLYCOLAX) packet Take 17 g by mouth daily as needed for mild constipation.   never  . HYDROcodone-acetaminophen (NORCO/VICODIN) 5-325 MG tablet Take 1-2 tablets by mouth every 6 (six) hours as needed for moderate pain. 20 tablet 0    Scheduled: . ALPRAZolam  1 mg Oral BID  . azithromycin  500 mg Oral Daily  . enoxaparin (LOVENOX) injection  60 mg Subcutaneous Q24H  . furosemide  40 mg Intravenous Once  .  guaiFENesin  600 mg Oral BID  . imiquimod   Topical Once per day on Tue Thu Sat  . insulin aspart  0-20 Units Subcutaneous TID WC  . insulin aspart  0-5 Units Subcutaneous QHS  . insulin glargine  15 Units Subcutaneous Daily  . ipratropium-albuterol  3 mL Nebulization Q4H  . magic mouthwash  5 mL Oral TID PC & HS  . methylPREDNISolone (SOLU-MEDROL) injection  60 mg Intravenous Q6H  . mometasone-formoterol  2 puff Inhalation BID  . pantoprazole  40 mg Oral q morning - 10a  . potassium chloride  20 mEq Oral Once  . sodium chloride flush  3 mL Intravenous Q12H   Continuous: . cefTRIAXone (ROCEPHIN)  IV     LOV:[id number] **OR** acetaminophen, albuterol, HYDROcodone-acetaminophen, ondansetron **OR** ondansetron (ZOFRAN) IV, polyethylene glycol  Assesment: She has community-acquired pneumonia. She has COPD exacerbation. She has improved on increased doses of steroids. She is however having some side effects. She has fluid retention so on going to give her a dose of Lasix. She has obstructive sleep apnea and uses her CPAP and finds it very helpful. Active Problems:   CAP (community acquired pneumonia)   Hypokalemia   Obesity, unspecified   Respiratory failure (HCC)   OSA (obstructive sleep apnea)   COPD exacerbation (HCC)    Plan: Continue current dose of Solu-Medrol today. Probably decrease tomorrow assuming continued improvement. Take a dose of Lasix today. Repeat blood tests in the morning    LOS: 8 days   Savana Spina L 09/08/2016, 8:59 AM

## 2016-09-08 NOTE — Care Management Note (Signed)
Case Management Note  Patient Details  Name: Denise Ray MRN: [id number] Date of Birth: 1970/07/09  If discussed at [facility] Length of Stay Meetings, dates discussed:  09/08/2016   Sherald Barge, RN 09/08/2016, 2:30 PM

## 2016-09-09 LAB — CBC WITH DIFFERENTIAL/PLATELET
BASOS ABS: 0 10*3/uL (ref 0.0–0.1)
Basophils Relative: 0 %
EOS PCT: 0 %
Eosinophils Absolute: 0 10*3/uL (ref 0.0–0.7)
HCT: 41.8 % (ref 36.0–46.0)
Hemoglobin: 13.9 g/dL (ref 12.0–15.0)
LYMPHS PCT: 7 %
Lymphs Abs: 1.2 10*3/uL (ref 0.7–4.0)
MCH: 29.4 pg (ref 26.0–34.0)
MCHC: 33.3 g/dL (ref 30.0–36.0)
MCV: 88.6 fL (ref 78.0–100.0)
MONO ABS: 0.8 10*3/uL (ref 0.1–1.0)
Monocytes Relative: 5 %
Neutro Abs: 16 10*3/uL — ABNORMAL HIGH (ref 1.7–7.7)
Neutrophils Relative %: 88 %
PLATELETS: 289 10*3/uL (ref 150–400)
RBC: 4.72 MIL/uL (ref 3.87–5.11)
RDW: 14.5 % (ref 11.5–15.5)
WBC: 18.1 10*3/uL — AB (ref 4.0–10.5)

## 2016-09-09 LAB — GLUCOSE, CAPILLARY
GLUCOSE-CAPILLARY: 199 mg/dL — AB (ref 65–99)
GLUCOSE-CAPILLARY: 161 mg/dL — AB (ref 65–99)
Glucose-Capillary: 230 mg/dL — ABNORMAL HIGH (ref 65–99)
Glucose-Capillary: 211 mg/dL — ABNORMAL HIGH (ref 65–99)

## 2016-09-09 LAB — BASIC METABOLIC PANEL
ANION GAP: 15 (ref 5–15)
BUN: 28 mg/dL — AB (ref 6–20)
CALCIUM: 9.4 mg/dL (ref 8.9–10.3)
CO2: 24 mmol/L (ref 22–32)
Chloride: 95 mmol/L — ABNORMAL LOW (ref 101–111)
Creatinine, Ser: 0.76 mg/dL (ref 0.44–1.00)
GFR calc Af Amer: >60 mL/min (ref >60)
GFR calc non Af Amer: >60 mL/min (ref >60)
GLUCOSE: 211 mg/dL — AB (ref 65–99)
Potassium: 4.4 mmol/L (ref 3.5–5.1)
Sodium: 134 mmol/L — ABNORMAL LOW (ref 135–145)

## 2016-09-09 MED ORDER — METHYLPREDNISOLONE SODIUM SUCC 40 MG IJ SOLR
40.0000 mg | Freq: Two times a day (BID) | INTRAMUSCULAR | Status: DC
  Administered 2016-09-09: 40 mg via INTRAVENOUS
  Filled 2016-09-09 (×2): qty 1

## 2016-09-09 NOTE — Progress Notes (Signed)
Subjective: She feels better. Her breathing is better. She wants to get information about diabetes. No other new complaints. She still complains of side effects from increased doses of steroids  Objective: Vital signs in last 24 hours: Temp:  [97.8 F (36.6 C)-98 F (36.7 C)] 98 F (36.7 C) (04/25 0550) Pulse Rate:  [77-91] 91 (04/25 0550) Resp:  [20] 20 (04/25 0550) BP: (120-136)/(52-77) 127/77 (04/25 0550) SpO2:  [93 %-99 %] 96 % (04/25 0850) FiO2 (%):  [98 %] 98 % (04/24 1515) Weight:  [117.7 kg (259 lb 6.4 oz)] 117.7 kg (259 lb 6.4 oz) (04/25 0550) Weight change: -0.318 kg (-11.2 oz) Last BM Date: 09/08/16  Intake/Output from previous day: 04/24 0701 - 04/25 0700 In: 2290 [P.O.:2290] Out: 4500 [Urine:4500]  PHYSICAL EXAM General appearance: alert, cooperative and mild distress Resp: Prolonged expiratory phase Cardio: regular rate and rhythm, S1, S2 normal, no murmur, click, rub or gallop GI: soft, non-tender; bowel sounds normal; no masses,  no organomegaly Extremities: extremities normal, atraumatic, no cyanosis or edema Skin warm and dry  Lab Results:  Results for orders placed or performed during the hospital encounter of 08/31/16 (from the past 48 hour(s))  Glucose, capillary     Status: Abnormal   Collection Time: 09/07/16 11:16 AM  Result Value Ref Range   Glucose-Capillary 159 (H) 65 - 99 mg/dL  Glucose, capillary     Status: Abnormal   Collection Time: 09/07/16  4:14 PM  Result Value Ref Range   Glucose-Capillary 160 (H) 65 - 99 mg/dL  Glucose, capillary     Status: Abnormal   Collection Time: 09/07/16 10:12 PM  Result Value Ref Range   Glucose-Capillary 165 (H) 65 - 99 mg/dL   Comment 1 Notify RN    Comment 2 Document in Chart   Glucose, capillary     Status: Abnormal   Collection Time: 09/08/16  8:07 AM  Result Value Ref Range   Glucose-Capillary 204 (H) 65 - 99 mg/dL  Glucose, capillary     Status: Abnormal   Collection Time: 09/08/16 12:36 PM   Result Value Ref Range   Glucose-Capillary 185 (H) 65 - 99 mg/dL  Glucose, capillary     Status: Abnormal   Collection Time: 09/08/16  4:31 PM  Result Value Ref Range   Glucose-Capillary 185 (H) 65 - 99 mg/dL  Glucose, capillary     Status: Abnormal   Collection Time: 09/08/16 10:20 PM  Result Value Ref Range   Glucose-Capillary 191 (H) 65 - 99 mg/dL   Comment 1 Notify RN    Comment 2 Document in Chart   CBC with Differential/Platelet     Status: Abnormal   Collection Time: 09/09/16  4:42 AM  Result Value Ref Range   WBC 18.1 (H) 4.0 - 10.5 K/uL   RBC 4.72 3.87 - 5.11 MIL/uL   Hemoglobin 13.9 12.0 - 15.0 g/dL   HCT 41.8 36.0 - 46.0 %   MCV 88.6 78.0 - 100.0 fL   MCH 29.4 26.0 - 34.0 pg   MCHC 33.3 30.0 - 36.0 g/dL   RDW 14.5 11.5 - 15.5 %   Platelets 289 150 - 400 K/uL   Neutrophils Relative % 88 %   Neutro Abs 16.0 (H) 1.7 - 7.7 K/uL   Lymphocytes Relative 7 %   Lymphs Abs 1.2 0.7 - 4.0 K/uL   Monocytes Relative 5 %   Monocytes Absolute 0.8 0.1 - 1.0 K/uL   Eosinophils Relative 0 %   Eosinophils Absolute  0.0 0.0 - 0.7 K/uL   Basophils Relative 0 %   Basophils Absolute 0.0 0.0 - 0.1 K/uL  Basic metabolic panel     Status: Abnormal   Collection Time: 09/09/16  4:42 AM  Result Value Ref Range   Sodium 134 (L) 135 - 145 mmol/L   Potassium 4.4 3.5 - 5.1 mmol/L   Chloride 95 (L) 101 - 111 mmol/L   CO2 24 22 - 32 mmol/L   Glucose, Bld 211 (H) 65 - 99 mg/dL   BUN 28 (H) 6 - 20 mg/dL   Creatinine, Ser 0.76 0.44 - 1.00 mg/dL   Calcium 9.4 8.9 - 10.3 mg/dL   GFR calc non Af Amer >60 >60 mL/min   GFR calc Af Amer >60 >60 mL/min    Comment: (NOTE) The eGFR has been calculated using the CKD EPI equation. This calculation has not been validated in all clinical situations. eGFR's persistently <60 mL/min signify possible Chronic Kidney Disease.    Anion gap 15 5 - 15  Glucose, capillary     Status: Abnormal   Collection Time: 09/09/16  7:50 AM  Result Value Ref Range    Glucose-Capillary 230 (H) 65 - 99 mg/dL   Comment 1 Notify RN    Comment 2 Document in Chart     ABGS No results for input(s): PHART, PO2ART, TCO2, HCO3 in the last 72 hours.  Invalid input(s): PCO2 CULTURES Recent Results (from the past 240 hour(s))  MRSA PCR Screening     Status: None   Collection Time: 08/31/16  3:31 PM  Result Value Ref Range Status   MRSA by PCR NEGATIVE NEGATIVE Final    Comment:        The GeneXpert MRSA Assay (FDA approved for NASAL specimens only), is one component of a comprehensive MRSA colonization surveillance program. It is not intended to diagnose MRSA infection nor to guide or monitor treatment for MRSA infections.   Culture, sputum-assessment     Status: None   Collection Time: 09/04/16 12:04 PM  Result Value Ref Range Status   Specimen Description EXPECTORATED SPUTUM  Final   Special Requests Normal  Final   Sputum evaluation THIS SPECIMEN IS ACCEPTABLE FOR SPUTUM CULTURE  Final   Report Status 09/04/2016 FINAL  Final  Culture, respiratory (NON-Expectorated)     Status: None   Collection Time: 09/04/16 12:04 PM  Result Value Ref Range Status   Specimen Description EXPECTORATED SPUTUM  Final   Special Requests Normal Reflexed from [id number]  Final   Gram Stain   Final    ABUNDANT WBC PRESENT,BOTH PMN AND MONONUCLEAR RARE GRAM POSITIVE COCCI IN PAIRS    Culture   Final    Consistent with normal respiratory flora. Performed at [facility], [place] [address] [place], [place] [address]    Report Status 09/06/2016 FINAL  Final   Studies/Results: No results found.  Medications:  Prior to Admission:  Prescriptions Prior to Admission  Medication Sig Dispense Refill Last Dose  . albuterol (PROVENTIL HFA;VENTOLIN HFA) 108 (90 BASE) MCG/ACT inhaler Inhale 2 puffs into the lungs every 6 (six) hours as needed. wheezing 1 Inhaler 11 Past Week at Unknown time  . albuterol (PROVENTIL) (5 MG/ML) 0.5% nebulizer solution Take 2.5 mg by  nebulization every 6 (six) hours as needed for wheezing or shortness of breath.   More than a month at Unknown time  . ALPRAZolam (XANAX) 1 MG tablet Take 1 mg by mouth 2 (two) times daily.  12/18/2015 at 0200  . HYDROcodone-acetaminophen (NORCO) 10-325 MG tablet Take 1 tablet by mouth every 6 (six) hours.  0   . imiquimod (ALDARA) 5 % cream Apply 1 application topically 3 (three) times a week.     . pantoprazole (PROTONIX) 40 MG tablet Take 40 mg by mouth every morning.    12/18/2015 at 0200  . polyethylene glycol (MIRALAX / GLYCOLAX) packet Take 17 g by mouth daily as needed for mild constipation.   never  . HYDROcodone-acetaminophen (NORCO/VICODIN) 5-325 MG tablet Take 1-2 tablets by mouth every 6 (six) hours as needed for moderate pain. 20 tablet 0    Scheduled: . ALPRAZolam  1 mg Oral BID  . azithromycin  500 mg Oral Daily  . enoxaparin (LOVENOX) injection  60 mg Subcutaneous Q24H  . guaiFENesin  600 mg Oral BID  . imiquimod   Topical Once per day on Tue Thu Sat  . insulin aspart  0-20 Units Subcutaneous TID WC  . insulin aspart  0-5 Units Subcutaneous QHS  . insulin glargine  15 Units Subcutaneous Daily  . ipratropium-albuterol  3 mL Nebulization Q4H  . magic mouthwash  5 mL Oral TID PC & HS  . methylPREDNISolone (SOLU-MEDROL) injection  40 mg Intravenous Q12H  . mometasone-formoterol  2 puff Inhalation BID  . pantoprazole  40 mg Oral q morning - 10a  . sodium chloride flush  3 mL Intravenous Q12H   Continuous: . cefTRIAXone (ROCEPHIN)  IV     [id number] **OR** acetaminophen, albuterol, HYDROcodone-acetaminophen, ondansetron **OR** ondansetron (ZOFRAN) IV, polyethylene glycol  Assesment: She was admitted with community-acquired pneumonia. She has significant COPD exacerbation. She has been very slow to respond. She is finally doing better. She has sleep apnea which is stable. She has diabetes at least partially related to the steroids. Active Problems:   CAP (community  acquired pneumonia)   Hypokalemia   Obesity, unspecified   Respiratory failure (HCC)   OSA (obstructive sleep apnea)   COPD exacerbation (HCC)    Plan: Continue current treatments. Reduce her steroids. Switch to oral tomorrow and if she does well she'll be discharged the next day    LOS: 9 days   Adamary Savary L 09/09/2016, 8:57 AM

## 2016-09-09 NOTE — Plan of Care (Signed)
Problem: Food- and Nutrition-Related Knowledge Deficit (NB-1.1) Goal: Nutrition education Formal process to instruct or train a patient/client in a skill or to impart knowledge to help patients/clients voluntarily manage or modify food choices and eating behavior to maintain or improve health. Outcome: Completed/Met Date Met: 09/09/16  RD consulted for nutrition education regarding diabetes, per pt request.   Lab Results  Component Value Date   HGBA1C 6.6 (H) 09/03/2016    RD provided "Type 2 diabetes Nutrition Therapy" and "Carbohydrate label reading tips" handoust from the Academy of Nutrition and Dietetics as well as a Copy of the diabetic "My Plate".  Pt reported that she has made many beneficial dietary changes in the last year including eliminating sodas, switching to water and switching to whole grains, but is looking for more guidance now that she has been placed on long term steroid therapy.   Went through her dietary recall. One very pertinent aspect of her life is that she is a Night shift (7pm to 7 am), respiratory therapist and works 3, 12 hr shifts.   RD attempted to go through her dietary recall, but she reports eating extremely little. She says she eats at most 1x a day, and occassionally doesn't eat at all. She states she may go 3-4 days without eating. She drinks water with sweeteners and some milk.   RD spent a great deal of time educating the patient on a diabetic diet.   First and foremost, discussed importance of eating 3x a day. Educated on how body reacts to fasting for days at a time and how much better her BG can be controlled with more consistent intake.   Spent time talking about the different food groups and what role each one plays in BG control. Showed the plate method template and educated on best food group ratio for glycemic control. Gave several examples of appropriate snacks/meals that followed this template and were not necessarily in the form of plates.    We spent considerable amount of time discussing carb counting and how to go about doing this. Showed label and pertinent information that should be looked for.   Advocated for physical activity as much as she can.   Given her real lack of any specific information on her current diet, mainly spoke about what she should do vs what she should correct. She more or less is starting with a "blank slate".   Answered all questions to her satisfaction, .  Summary of Recommendations: 1. Eat 3x a day, do not skip meals 2. Follow Plate Method 31% Carbs, 25-50% veg, 25-50% protein 3. Exercise as tolerated 4. Count Carbohydrates, 15-30 g/snack, 45-70 g/meals  Expect Good compliance. She is very motivated, asked very appropriate questions and seems to really want to improve her glycemic control. She has a health background and understands the complications that can arise from poorly controlled DM.   Body mass index is 44.53 kg/m. Pt meets criteria for Morbidly obese based on current BMI.  Current diet order is Carb Mod, patient is consuming approximately 100% of meals at this time. Labs and medications reviewed. No further nutrition interventions warranted at this time. . If additional nutrition issues arise, please re-consult RD.  Burtis Junes RD, LDN, CNSC Clinical Nutrition Pager: [phone] 09/09/2016 2:35 PM

## 2016-09-09 NOTE — Consult Note (Signed)
Inpatient Diabetes Program Recommendations  AACE/ADA: New Consensus Statement on Inpatient Glycemic Control (2015)  Target Ranges:  Prepandial:   less than 140 mg/dL      Peak postprandial:   less than 180 mg/dL (1-2 hours)      Critically ill patients:  140 - 180 mg/dL   Lab Results  Component Value Date   GLUCAP 230 (H) 09/09/2016   HGBA1C 6.6 (H) 09/03/2016    Review of Glycemic Control  Diabetes history: potential for "steroid induced" dm, but A1C at 6.6% (beginning phase of diabetes but overall controlled.) Fasting glucose most likely elevated due to steroid solumedrol and home steroids Outpatient Diabetes medications: No meds Current orders for Inpatient glycemic control: Lantus 15 units and resistant correction tidwc and HS correction scale.  Inpatient Diabetes Program Recommendations:  Noted A1C of 6.6%. Would recommend starting metformin 500 mg before breakfast and increase as needed. Can increase by 500 mg added to supper and increase as/if needed in next months following. Attempted to call patient in her room, but she did not answer. Noted consult for dietician as pt noted to want information about diet. Will order a [facility] consult as she is listed on the registry.  Thank you Rosita Kea, RN, MSN, CDE  Diabetes Inpatient Program Office: [phone] Pager: [phone] 8:00 am to 5:00 pm

## 2016-09-10 DIAGNOSIS — J189 Pneumonia, unspecified organism: Secondary | ICD-10-CM | POA: Diagnosis not present

## 2016-09-10 DIAGNOSIS — I472 Ventricular tachycardia: Secondary | ICD-10-CM | POA: Diagnosis not present

## 2016-09-10 DIAGNOSIS — J9621 Acute and chronic respiratory failure with hypoxia: Secondary | ICD-10-CM | POA: Diagnosis not present

## 2016-09-10 DIAGNOSIS — B370 Candidal stomatitis: Secondary | ICD-10-CM | POA: Diagnosis not present

## 2016-09-10 DIAGNOSIS — Z6841 Body Mass Index (BMI) 40.0 and over, adult: Secondary | ICD-10-CM | POA: Diagnosis not present

## 2016-09-10 DIAGNOSIS — J440 Chronic obstructive pulmonary disease with acute lower respiratory infection: Secondary | ICD-10-CM | POA: Diagnosis not present

## 2016-09-10 DIAGNOSIS — K219 Gastro-esophageal reflux disease without esophagitis: Secondary | ICD-10-CM | POA: Diagnosis not present

## 2016-09-10 DIAGNOSIS — C20 Malignant neoplasm of rectum: Secondary | ICD-10-CM | POA: Diagnosis not present

## 2016-09-10 DIAGNOSIS — J441 Chronic obstructive pulmonary disease with (acute) exacerbation: Secondary | ICD-10-CM | POA: Diagnosis not present

## 2016-09-10 LAB — GLUCOSE, CAPILLARY
GLUCOSE-CAPILLARY: 179 mg/dL — AB (ref 65–99)
GLUCOSE-CAPILLARY: 229 mg/dL — AB (ref 65–99)
Glucose-Capillary: 228 mg/dL — ABNORMAL HIGH (ref 65–99)
Glucose-Capillary: 145 mg/dL — ABNORMAL HIGH (ref 65–99)
Glucose-Capillary: 160 mg/dL — ABNORMAL HIGH (ref 65–99)

## 2016-09-10 MED ORDER — PREDNISONE 20 MG PO TABS
40.0000 mg | ORAL_TABLET | Freq: Every day | ORAL | Status: DC
  Administered 2016-09-10: 40 mg via ORAL
  Filled 2016-09-10: qty 2

## 2016-09-10 MED ORDER — CEFUROXIME AXETIL 250 MG PO TABS
500.0000 mg | ORAL_TABLET | Freq: Two times a day (BID) | ORAL | Status: DC
  Administered 2016-09-10 (×2): 500 mg via ORAL
  Filled 2016-09-10 (×2): qty 2

## 2016-09-10 MED ORDER — LIVING WELL WITH DIABETES BOOK
Freq: Once | Status: AC
  Administered 2016-09-10: 13:00:00
  Filled 2016-09-10: qty 1

## 2016-09-10 NOTE — Care Management Note (Signed)
Case Management Note  Patient Details  Name: Denise Ray MRN: [id number] Date of Birth: 12/27/1970  If discussed at [place] Length of Stay Meetings, dates discussed:  09/10/2016  Additional Comments: anticipate DC home tomorrow.   Sherald Barge, RN 09/10/2016, 11:27 AM

## 2016-09-10 NOTE — Progress Notes (Signed)
Inpatient Diabetes Program Recommendations  AACE/ADA: New Consensus Statement on Inpatient Glycemic Control (2015)  Target Ranges:  Prepandial:   less than 140 mg/dL      Peak postprandial:   less than 180 mg/dL (1-2 hours)      Critically ill patients:  140 - 180 mg/dL    Review of Glycemic Control  Diabetes history: Borderline DM (per H&P on 05/07/15) Outpatient Diabetes medications: None Current orders for Inpatient glycemic control: Lantus 15 units daily, Novolog 0-20 units TID with meals, Novolog 0-5 units QHS  Inpatient Diabetes Program Recommendations: HgbA1C: A1C 6.6% on 09/03/16.  NOTE: Spoke with patient over the phone about new diabetes diagnosis. 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 Patient has some knowledge about diabetes but very eager to learn more.  Discussed A1C results (6.6% on 09/03/16) and explained what an A1C is. Explained that current A1C obtained after patient was started on steroids.  Discussed basic pathophysiology of DM Type 2, basic home care, importance of checking CBGs and maintaining good CBG control to prevent long-term and short-term complications. Reviewed glucose and A1C goals.  Discussed impact of nutrition, exercise, stress, sickness, and medications on diabetes control. Patient understands that steroids currently receiving are contributing to hyperglycemia. Informed patient Living Well with diabetes booklet was ordered and and encouraged patient to read through entire book once she receives the book from her nurse. Patient states that she has a good understanding of a carb modified diet now after talking with the RD yesterday and patient plans to make dietary changes to improve glycemic control. Discussed glucose monitoring and patient states that she feels she will not have any issues with glucose monitoring. Discussed Link to Wellness program for [employer] and patient states that she has already been contacted by  [facility] CM and plans to get signed up with the program.  Discussed Metformin and how it works for DM control. Expect good compliance with DM control as patient is eager to learn and motivated to make dietary and exercise changes. Patient verbalized understanding of information discussed and she states that she has no further questions at this time related to diabetes.   RNs to provide ongoing basic DM education at bedside with this patient and engage patient to actively check blood glucose.   Thanks, Barnie Alderman, RN, MSN, CDE Diabetes Coordinator Inpatient Diabetes Program [phone] (Team Pager from 8am to 5pm)

## 2016-09-10 NOTE — Progress Notes (Signed)
Subjective: She feels much better. She still has side effects from steroids. Her breathing is improving. She had consultation with dietary and recommendations from diabetes coordinator. She says she's not coughing anything up. She is much less short of breath. No chest pain.  Objective: Vital signs in last 24 hours: Temp:  [97.9 F (36.6 C)-98.5 F (36.9 C)] 97.9 F (36.6 C) (04/26 0658) Pulse Rate:  [68-86] 71 (04/26 0658) Resp:  [18] 18 (04/26 0658) BP: (115-127)/(48-74) 115/48 (04/26 0658) SpO2:  [96 %-99 %] 99 % (04/26 0658) FiO2 (%):  [93 %-97 %] 97 % (04/25 1640) Weight:  [116.8 kg (257 lb 8 oz)] 116.8 kg (257 lb 8 oz) (04/26 0658) Weight change: -0.863 kg (-1 lb 14.4 oz) Last BM Date: 09/08/16  Intake/Output from previous day: 04/25 0701 - 04/26 0700 In: 240 [P.O.:240] Out: 2750 [Urine:2750]  PHYSICAL EXAM General appearance: alert, cooperative, no distress and morbidly obese Resp: Clear with prolonged expiratory phase Cardio: regular rate and rhythm, S1, S2 normal, no murmur, click, rub or gallop GI: soft, non-tender; bowel sounds normal; no masses,  no organomegaly Extremities: extremities normal, atraumatic, no cyanosis or edema Skin warm and dry.  Lab Results:  Results for orders placed or performed during the hospital encounter of 08/31/16 (from the past 48 hour(s))  Glucose, capillary     Status: Abnormal   Collection Time: 09/08/16 12:36 PM  Result Value Ref Range   Glucose-Capillary 185 (H) 65 - 99 mg/dL  Glucose, capillary     Status: Abnormal   Collection Time: 09/08/16  4:31 PM  Result Value Ref Range   Glucose-Capillary 185 (H) 65 - 99 mg/dL  Glucose, capillary     Status: Abnormal   Collection Time: 09/08/16 10:20 PM  Result Value Ref Range   Glucose-Capillary 191 (H) 65 - 99 mg/dL   Comment 1 Notify RN    Comment 2 Document in Chart   CBC with Differential/Platelet     Status: Abnormal   Collection Time: 09/09/16  4:42 AM  Result Value Ref Range    WBC 18.1 (H) 4.0 - 10.5 K/uL   RBC 4.72 3.87 - 5.11 MIL/uL   Hemoglobin 13.9 12.0 - 15.0 g/dL   HCT 41.8 36.0 - 46.0 %   MCV 88.6 78.0 - 100.0 fL   MCH 29.4 26.0 - 34.0 pg   MCHC 33.3 30.0 - 36.0 g/dL   RDW 14.5 11.5 - 15.5 %   Platelets 289 150 - 400 K/uL   Neutrophils Relative % 88 %   Neutro Abs 16.0 (H) 1.7 - 7.7 K/uL   Lymphocytes Relative 7 %   Lymphs Abs 1.2 0.7 - 4.0 K/uL   Monocytes Relative 5 %   Monocytes Absolute 0.8 0.1 - 1.0 K/uL   Eosinophils Relative 0 %   Eosinophils Absolute 0.0 0.0 - 0.7 K/uL   Basophils Relative 0 %   Basophils Absolute 0.0 0.0 - 0.1 K/uL  Basic metabolic panel     Status: Abnormal   Collection Time: 09/09/16  4:42 AM  Result Value Ref Range   Sodium 134 (L) 135 - 145 mmol/L   Potassium 4.4 3.5 - 5.1 mmol/L   Chloride 95 (L) 101 - 111 mmol/L   CO2 24 22 - 32 mmol/L   Glucose, Bld 211 (H) 65 - 99 mg/dL   BUN 28 (H) 6 - 20 mg/dL   Creatinine, Ser 0.76 0.44 - 1.00 mg/dL   Calcium 9.4 8.9 - 10.3 mg/dL   GFR calc  non Af Amer >60 >60 mL/min   GFR calc Af Amer >60 >60 mL/min    Comment: (NOTE) The eGFR has been calculated using the CKD EPI equation. This calculation has not been validated in all clinical situations. eGFR's persistently <60 mL/min signify possible Chronic Kidney Disease.    Anion gap 15 5 - 15  Glucose, capillary     Status: Abnormal   Collection Time: 09/09/16  7:50 AM  Result Value Ref Range   Glucose-Capillary 230 (H) 65 - 99 mg/dL   Comment 1 Notify RN    Comment 2 Document in Chart   Glucose, capillary     Status: Abnormal   Collection Time: 09/09/16 12:07 PM  Result Value Ref Range   Glucose-Capillary 211 (H) 65 - 99 mg/dL   Comment 1 Notify RN    Comment 2 Document in Chart   Glucose, capillary     Status: Abnormal   Collection Time: 09/09/16  4:16 PM  Result Value Ref Range   Glucose-Capillary 199 (H) 65 - 99 mg/dL   Comment 1 Notify RN    Comment 2 Document in Chart   Glucose, capillary     Status:  Abnormal   Collection Time: 09/09/16  9:43 PM  Result Value Ref Range   Glucose-Capillary 161 (H) 65 - 99 mg/dL   Comment 1 Notify RN    Comment 2 Document in Chart   Glucose, capillary     Status: Abnormal   Collection Time: 09/10/16  2:31 AM  Result Value Ref Range   Glucose-Capillary 228 (H) 65 - 99 mg/dL   Comment 1 Notify RN    Comment 2 Document in Chart   Glucose, capillary     Status: Abnormal   Collection Time: 09/10/16  7:33 AM  Result Value Ref Range   Glucose-Capillary 179 (H) 65 - 99 mg/dL   Comment 1 Notify RN    Comment 2 Document in Chart     ABGS No results for input(s): PHART, PO2ART, TCO2, HCO3 in the last 72 hours.  Invalid input(s): PCO2 CULTURES Recent Results (from the past 240 hour(s))  MRSA PCR Screening     Status: None   Collection Time: 08/31/16  3:31 PM  Result Value Ref Range Status   MRSA by PCR NEGATIVE NEGATIVE Final    Comment:        The GeneXpert MRSA Assay (FDA approved for NASAL specimens only), is one component of a comprehensive MRSA colonization surveillance program. It is not intended to diagnose MRSA infection nor to guide or monitor treatment for MRSA infections.   Culture, sputum-assessment     Status: None   Collection Time: 09/04/16 12:04 PM  Result Value Ref Range Status   Specimen Description EXPECTORATED SPUTUM  Final   Special Requests Normal  Final   Sputum evaluation THIS SPECIMEN IS ACCEPTABLE FOR SPUTUM CULTURE  Final   Report Status 09/04/2016 FINAL  Final  Culture, respiratory (NON-Expectorated)     Status: None   Collection Time: 09/04/16 12:04 PM  Result Value Ref Range Status   Specimen Description EXPECTORATED SPUTUM  Final   Special Requests Normal Reflexed from [id number]  Final   Gram Stain   Final    ABUNDANT WBC PRESENT,BOTH PMN AND MONONUCLEAR RARE GRAM POSITIVE COCCI IN PAIRS    Culture   Final    Consistent with normal respiratory flora. Performed at [facility], [place] [address],  [place], [place] [address]    Report Status 09/06/2016  FINAL  Final   Studies/Results: No results found.  Medications:  Prior to Admission:  Prescriptions Prior to Admission  Medication Sig Dispense Refill Last Dose  . albuterol (PROVENTIL HFA;VENTOLIN HFA) 108 (90 BASE) MCG/ACT inhaler Inhale 2 puffs into the lungs every 6 (six) hours as needed. wheezing 1 Inhaler 11 Past Week at Unknown time  . albuterol (PROVENTIL) (5 MG/ML) 0.5% nebulizer solution Take 2.5 mg by nebulization every 6 (six) hours as needed for wheezing or shortness of breath.   More than a month at Unknown time  . ALPRAZolam (XANAX) 1 MG tablet Take 1 mg by mouth 2 (two) times daily.   12/18/2015 at 0200  . HYDROcodone-acetaminophen (NORCO) 10-325 MG tablet Take 1 tablet by mouth every 6 (six) hours.  0   . imiquimod (ALDARA) 5 % cream Apply 1 application topically 3 (three) times a week.     . pantoprazole (PROTONIX) 40 MG tablet Take 40 mg by mouth every morning.    12/18/2015 at 0200  . polyethylene glycol (MIRALAX / GLYCOLAX) packet Take 17 g by mouth daily as needed for mild constipation.   never  . HYDROcodone-acetaminophen (NORCO/VICODIN) 5-325 MG tablet Take 1-2 tablets by mouth every 6 (six) hours as needed for moderate pain. 20 tablet 0    Scheduled: . ALPRAZolam  1 mg Oral BID  . azithromycin  500 mg Oral Daily  . cefUROXime  500 mg Oral BID WC  . enoxaparin (LOVENOX) injection  60 mg Subcutaneous Q24H  . guaiFENesin  600 mg Oral BID  . imiquimod   Topical Once per day on Tue Thu Sat  . insulin aspart  0-20 Units Subcutaneous TID WC  . insulin aspart  0-5 Units Subcutaneous QHS  . insulin glargine  15 Units Subcutaneous Daily  . ipratropium-albuterol  3 mL Nebulization Q4H  . magic mouthwash  5 mL Oral TID PC & HS  . mometasone-formoterol  2 puff Inhalation BID  . pantoprazole  40 mg Oral q morning - 10a  . predniSONE  40 mg Oral Q breakfast  . sodium chloride flush  3 mL Intravenous Q12H    Continuous:  [id number]:[id number] **OR** acetaminophen, albuterol, HYDROcodone-acetaminophen, ondansetron **OR** ondansetron (ZOFRAN) IV, polyethylene glycol  Assesment: She was admitted with community-acquired pneumonia. She had acute on chronic respiratory failure. She had COPD exacerbation. She is doing much better. She's had trouble with her blood sugar since she's been in the hospital and been on steroids. She has steroid side effects. She will complete 10 days of antibiotics today. Active Problems:   CAP (community acquired pneumonia)   Hypokalemia   Obesity, unspecified   Respiratory failure (HCC)   OSA (obstructive sleep apnea)   COPD exacerbation (HCC)    Plan: Continue treatments. Discontinue IV steroids. Switch her to oral antibiotics. Start prednisone. Probable discharge tomorrow    LOS: 10 days   Elder Davidian L 09/10/2016, 8:29 AM

## 2016-09-10 NOTE — Consult Note (Signed)
   [facility] CM Inpatient Consult   09/10/2016  IVAN MASKELL 1971-01-02 [id number]     Telephone call made to Ms. Tomi Bamberger on behalf of [facility] to [facility] Care Management program for [employer] employees/dependents with [facility] insurance. Discussed [employer] to Well[employer] program for DM management. Ms. Fenter states " I have been wondering how to get connected with you guys". Writer informed her of [employer] to Wellness benefits. She is agreeable and would like to enroll in the program. Discussed quickest way to enroll in program and to receive benefits for DM supplies. Ms. Sundby indicates coming to the [facility] office in [place] would be more convenient for her. Provided address and telephone number to Ms. Nielson and writer's contact information as well. Appreciative of information.   Will send notification to [facility] Care Management office. Will make inpatient RNCM aware as well.    Marthenia Rolling, MSN-Ed, RN,BSN [facility] Liaison [phone]

## 2016-09-11 DIAGNOSIS — E119 Type 2 diabetes mellitus without complications: Secondary | ICD-10-CM

## 2016-09-11 LAB — GLUCOSE, CAPILLARY: GLUCOSE-CAPILLARY: 157 mg/dL — AB (ref 65–99)

## 2016-09-11 MED ORDER — METFORMIN HCL ER 500 MG PO TB24: 500.0000 mg | ORAL_TABLET | Freq: Two times a day (BID) | ORAL | 12 refills | Status: DC

## 2016-09-11 MED ORDER — GUAIFENESIN ER 600 MG PO TB12: 600.0000 mg | ORAL_TABLET | Freq: Two times a day (BID) | ORAL | 1 refills | Status: DC

## 2016-09-11 MED ORDER — TIOTROPIUM BROMIDE MONOHYDRATE 18 MCG IN CAPS: 18.0000 ug | ORAL_CAPSULE | Freq: Every morning | RESPIRATORY_TRACT | 12 refills | Status: DC

## 2016-09-11 MED ORDER — PREDNISONE 10 MG (21) PO TBPK: ORAL_TABLET | ORAL | 1 refills | Status: DC

## 2016-09-11 MED FILL — FREESTYLE LANCETS: 50 days supply | Qty: 100 | Fill #0

## 2016-09-11 MED FILL — FREESTYLE LITE METER: 30 days supply | Qty: 1 | Fill #0

## 2016-09-11 MED FILL — FREESTYLE LITE TEST STRIP: 50 days supply | Qty: 100 | Fill #0

## 2016-09-11 MED FILL — SPIRIVA 18 MCG CP-HANDIHALE: 18 | 30 days supply | Qty: 30 | Fill #0

## 2016-09-11 MED FILL — predniSONE 10 MG (21) TBPK: 10 | 6 days supply | Qty: 21 | Fill #0

## 2016-09-11 MED FILL — METFORMIN HCL ER 500 MG TAB: 500 | 30 days supply | Qty: 60 | Fill #0

## 2016-09-11 NOTE — Progress Notes (Signed)
Patient walked one lap around the Department and maintained a O2 sat of 97% on room air.

## 2016-09-11 NOTE — Progress Notes (Signed)
Subjective: She feels substantially better and wants to go home. She has no new complaints. Her breathing is much better.  Objective: Vital signs in last 24 hours: Temp:  [97.6 F (36.4 C)-98.2 F (36.8 C)] 98 F (36.7 C) (04/27 0531) Pulse Rate:  [68-78] 78 (04/27 0531) Resp:  [18] 18 (04/26 2109) BP: (117-142)/(53-74) 117/53 (04/27 0531) SpO2:  [95 %-98 %] 97 % (04/27 0726) FiO2 (%):  [95 %] 95 % (04/26 1154) Weight:  [119.2 kg (262 lb 11.2 oz)] 119.2 kg (262 lb 11.2 oz) (04/27 0531) Weight change: 2.36 kg (5 lb 3.2 oz) Last BM Date: 09/08/16  Intake/Output from previous day: 04/26 0701 - 04/27 0700 In: 720 [P.O.:720] Out: 2001 [Urine:2000; Stool:1]  PHYSICAL EXAM General appearance: alert, cooperative, no distress and morbidly obese Resp: Her lungs are clear and she has somewhat prolonged expiratory phase Cardio: regular rate and rhythm, S1, S2 normal, no murmur, click, rub or gallop GI: soft, non-tender; bowel sounds normal; no masses,  no organomegaly Extremities: extremities normal, atraumatic, no cyanosis or edema Skin warm and dry  Lab Results:  Results for orders placed or performed during the hospital encounter of 08/31/16 (from the past 48 hour(s))  Glucose, capillary     Status: Abnormal   Collection Time: 09/09/16 12:07 PM  Result Value Ref Range   Glucose-Capillary 211 (H) 65 - 99 mg/dL   Comment 1 Notify RN    Comment 2 Document in Chart   Glucose, capillary     Status: Abnormal   Collection Time: 09/09/16  4:16 PM  Result Value Ref Range   Glucose-Capillary 199 (H) 65 - 99 mg/dL   Comment 1 Notify RN    Comment 2 Document in Chart   Glucose, capillary     Status: Abnormal   Collection Time: 09/09/16  9:43 PM  Result Value Ref Range   Glucose-Capillary 161 (H) 65 - 99 mg/dL   Comment 1 Notify RN    Comment 2 Document in Chart   Glucose, capillary     Status: Abnormal   Collection Time: 09/10/16  2:31 AM  Result Value Ref Range   Glucose-Capillary  228 (H) 65 - 99 mg/dL   Comment 1 Notify RN    Comment 2 Document in Chart   Glucose, capillary     Status: Abnormal   Collection Time: 09/10/16  7:33 AM  Result Value Ref Range   Glucose-Capillary 179 (H) 65 - 99 mg/dL   Comment 1 Notify RN    Comment 2 Document in Chart   Glucose, capillary     Status: Abnormal   Collection Time: 09/10/16 11:57 AM  Result Value Ref Range   Glucose-Capillary 229 (H) 65 - 99 mg/dL   Comment 1 Notify RN    Comment 2 Document in Chart   Glucose, capillary     Status: Abnormal   Collection Time: 09/10/16  3:50 PM  Result Value Ref Range   Glucose-Capillary 145 (H) 65 - 99 mg/dL   Comment 1 Notify RN    Comment 2 Document in Chart   Glucose, capillary     Status: Abnormal   Collection Time: 09/10/16  9:17 PM  Result Value Ref Range   Glucose-Capillary 160 (H) 65 - 99 mg/dL   Comment 1 Notify RN    Comment 2 Document in Chart   Glucose, capillary     Status: Abnormal   Collection Time: 09/11/16  7:35 AM  Result Value Ref Range   Glucose-Capillary 157 (H)  65 - 99 mg/dL    ABGS No results for input(s): PHART, PO2ART, TCO2, HCO3 in the last 72 hours.  Invalid input(s): PCO2 CULTURES Recent Results (from the past 240 hour(s))  Culture, sputum-assessment     Status: None   Collection Time: 09/04/16 12:04 PM  Result Value Ref Range Status   Specimen Description EXPECTORATED SPUTUM  Final   Special Requests Normal  Final   Sputum evaluation THIS SPECIMEN IS ACCEPTABLE FOR SPUTUM CULTURE  Final   Report Status 09/04/2016 FINAL  Final  Culture, respiratory (NON-Expectorated)     Status: None   Collection Time: 09/04/16 12:04 PM  Result Value Ref Range Status   Specimen Description EXPECTORATED SPUTUM  Final   Special Requests Normal Reflexed from [id number]  Final   Gram Stain   Final    ABUNDANT WBC PRESENT,BOTH PMN AND MONONUCLEAR RARE GRAM POSITIVE COCCI IN PAIRS    Culture   Final    Consistent with normal respiratory flora. Performed at  [facility], [place] [address], [place], [place] [address]    Report Status 09/06/2016 FINAL  Final   Studies/Results: No results found.  Medications:  Prior to Admission:  Prescriptions Prior to Admission  Medication Sig Dispense Refill Last Dose  . albuterol (PROVENTIL HFA;VENTOLIN HFA) 108 (90 BASE) MCG/ACT inhaler Inhale 2 puffs into the lungs every 6 (six) hours as needed. wheezing 1 Inhaler 11 Past Week at Unknown time  . albuterol (PROVENTIL) (5 MG/ML) 0.5% nebulizer solution Take 2.5 mg by nebulization every 6 (six) hours as needed for wheezing or shortness of breath.   More than a month at Unknown time  . ALPRAZolam (XANAX) 1 MG tablet Take 1 mg by mouth 2 (two) times daily.   12/18/2015 at 0200  . HYDROcodone-acetaminophen (NORCO) 10-325 MG tablet Take 1 tablet by mouth every 6 (six) hours.  0   . imiquimod (ALDARA) 5 % cream Apply 1 application topically 3 (three) times a week.     . pantoprazole (PROTONIX) 40 MG tablet Take 40 mg by mouth every morning.    12/18/2015 at 0200  . polyethylene glycol (MIRALAX / GLYCOLAX) packet Take 17 g by mouth daily as needed for mild constipation.   never  . HYDROcodone-acetaminophen (NORCO/VICODIN) 5-325 MG tablet Take 1-2 tablets by mouth every 6 (six) hours as needed for moderate pain. 20 tablet 0    Scheduled: . ALPRAZolam  1 mg Oral BID  . azithromycin  500 mg Oral Daily  . cefUROXime  500 mg Oral BID WC  . enoxaparin (LOVENOX) injection  60 mg Subcutaneous Q24H  . guaiFENesin  600 mg Oral BID  . imiquimod   Topical Once per day on Tue Thu Sat  . insulin aspart  0-20 Units Subcutaneous TID WC  . insulin aspart  0-5 Units Subcutaneous QHS  . insulin glargine  15 Units Subcutaneous Daily  . ipratropium-albuterol  3 mL Nebulization Q4H  . magic mouthwash  5 mL Oral TID PC & HS  . mometasone-formoterol  2 puff Inhalation BID  . pantoprazole  40 mg Oral q morning - 10a  . predniSONE  40 mg Oral Q breakfast  . sodium chloride flush  3  mL Intravenous Q12H   Continuous:  PNT:[id number] **OR** acetaminophen, albuterol, HYDROcodone-acetaminophen, ondansetron **OR** ondansetron (ZOFRAN) IV, polyethylene glycol  Assesment: She was admitted with community-acquired pneumonia and COPD exacerbation. She had a very slow progress. She is now back to baseline. She has diabetes and had been borderline  previously but now has full-blown diabetes. She has obstructive sleep apnea on CPAP and she's doing well with that. Active Problems:   CAP (community acquired pneumonia)   Hypokalemia   Obesity, unspecified   Respiratory failure (HCC)   OSA (obstructive sleep apnea)   COPD exacerbation ([place])    Plan: Discharge home today    LOS: 11 days   Hogan Hoobler L 09/11/2016, 9:02 AM

## 2016-09-11 NOTE — Care Management Note (Signed)
Case Management Note  Patient Details  Name: Denise Ray MRN: [id number] Date of Birth: Jun 07, 1970  Expected Discharge Date:  09/11/16               Expected Discharge Plan:  Home/Self Care  In-House Referral:  NA  Discharge planning Services  CM Consult  Post Acute Care Choice:  NA Choice offered to:  NA  Status of Service:  Completed, signed off  Additional Comments: Pt discharging home today. No CM needs.   Sherald Barge, RN 09/11/2016, 9:17 AM

## 2016-09-11 NOTE — Discharge Summary (Signed)
Physician Discharge Summary  Patient ID: Denise Ray MRN: [id number] DOB/AGE: 21-May-1970 46 y.o. Primary Care Physician:Kama Cammarano L, MD Admit date: 08/31/2016 Discharge date: 09/11/2016    Discharge Diagnoses:   Active Problems:   CAP (community acquired pneumonia)   Hypokalemia   Obesity, unspecified   Respiratory failure (HCC)   OSA (obstructive sleep apnea)   COPD exacerbation (HCC)   Diabetes ([place])   Allergies as of 09/11/2016      Reactions   Tamiflu Other (See Comments), Cough   BRONCHOCONSTRICTION   Estrogens Other (See Comments)   SYMPTOMS OF A STROKE   Wellbutrin [bupropion] Other (See Comments)   CAUSED TIA WHEN TAKEN WITH ESTROGEN   Ibuprofen Other (See Comments)   Avoids due to nosebleeds   Morphine And Related Other (See Comments)   DOES NOT RELIEVE PAIN      Medication List    TAKE these medications   albuterol (5 MG/ML) 0.5% nebulizer solution Commonly known as:  PROVENTIL Take 2.5 mg by nebulization every 6 (six) hours as needed for wheezing or shortness of breath.   albuterol 108 (90 Base) MCG/ACT inhaler Commonly known as:  PROVENTIL HFA;VENTOLIN HFA Inhale 2 puffs into the lungs every 6 (six) hours as needed. wheezing   ALPRAZolam 1 MG tablet Commonly known as:  XANAX Take 1 mg by mouth 2 (two) times daily.   guaiFENesin 600 MG 12 hr tablet Commonly known as:  MUCINEX Take 1 tablet (600 mg total) by mouth 2 (two) times daily.   HYDROcodone-acetaminophen 5-325 MG tablet Commonly known as:  NORCO/VICODIN Take 1-2 tablets by mouth every 6 (six) hours as needed for moderate pain.   HYDROcodone-acetaminophen 10-325 MG tablet Commonly known as:  NORCO Take 1 tablet by mouth every 6 (six) hours.   imiquimod 5 % cream Commonly known as:  ALDARA Apply 1 application topically 3 (three) times a week.   metFORMIN 500 MG 24 hr tablet Commonly known as:  GLUCOPHAGE XR Take 1 tablet (500 mg total) by mouth 2 (two) times daily.    pantoprazole 40 MG tablet Commonly known as:  PROTONIX Take 40 mg by mouth every morning.   polyethylene glycol packet Commonly known as:  MIRALAX / GLYCOLAX Take 17 g by mouth daily as needed for mild constipation.   predniSONE 10 MG (21) Tbpk tablet Commonly known as:  STERAPRED UNI-PAK 21 TAB Take by package instructions   tiotropium 18 MCG inhalation capsule Commonly known as:  SPIRIVA HANDIHALER Place 1 capsule (18 mcg total) into inhaler and inhale every morning.       Discharged Condition:Improved    Consults: None  Significant Diagnostic Studies: X-ray Chest Pa And Lateral  Result Date: 08/31/2016 CLINICAL DATA:  Acute onset of wheezing.  Initial encounter. EXAM: CHEST  2 VIEW COMPARISON:  Chest radiograph performed 05/11/2015 FINDINGS: The lungs are well-aerated. Mild vascular congestion is noted. Mild bibasilar opacities may reflect mild interstitial edema. There is no evidence of pleural effusion or pneumothorax. The heart is borderline normal in size. No acute osseous abnormalities are seen. IMPRESSION: Mild vascular congestion.  Question of mild interstitial edema. Electronically Signed   By: Garald Balding M.D.   On: 08/31/2016 18:25   Ct Angio Chest Pe W Or Wo Contrast  Result Date: 09/06/2016 CLINICAL DATA:  46 year old female with history of community acquired pneumonia. History of ARDS. COPD exacerbation. EXAM: CT ANGIOGRAPHY CHEST WITH CONTRAST TECHNIQUE: Multidetector CT imaging of the chest was performed using the standard protocol during bolus administration  of intravenous contrast. Multiplanar CT image reconstructions and MIPs were obtained to evaluate the vascular anatomy. CONTRAST:  100 mL of Isovue 370. COMPARISON:  Chest CT 05/07/2015. FINDINGS: Cardiovascular: No filling defect within the pulmonary arterial tree to suggest underlying pulmonary embolism. Heart size is mildly enlarged. There is no significant pericardial fluid, thickening or pericardial  calcification. No significant atherosclerotic disease noted in the thoracic aorta. No definite coronary artery calcifications. Mediastinum/Nodes: No pathologically enlarged mediastinal or hilar lymph nodes. Esophagus is unremarkable in appearance. No axillary lymphadenopathy. Lungs/Pleura: Evidence of air trapping throughout the lungs bilaterally. No acute consolidative airspace disease. No pleural effusions. Linear scarring in the right lower lobe. No definite suspicious appearing pulmonary nodules or masses. Upper Abdomen: Unremarkable. Musculoskeletal: There are no aggressive appearing lytic or blastic lesions noted in the visualized portions of the skeleton. Review of the MIP images confirms the above findings. IMPRESSION: 1. No evidence pulmonary embolism. 2. Widespread air trapping in the lungs bilaterally, indicative of small airways disease. 3. No other acute findings are noted. Electronically Signed   By: Vinnie Langton M.D.   On: 09/06/2016 15:46    Lab Results: Basic Metabolic Panel:  Recent Labs  09/09/16 0442  NA 134*  K 4.4  CL 95*  CO2 24  GLUCOSE 211*  BUN 28*  CREATININE 0.76  CALCIUM 9.4   Liver Function Tests: No results for input(s): AST, ALT, ALKPHOS, BILITOT, PROT, ALBUMIN in the last 72 hours.   CBC:  Recent Labs  09/09/16 0442  WBC 18.1*  NEUTROABS 16.0*  HGB 13.9  HCT 41.8  MCV 88.6  PLT 289    Recent Results (from the past 240 hour(s))  Culture, sputum-assessment     Status: None   Collection Time: 09/04/16 12:04 PM  Result Value Ref Range Status   Specimen Description EXPECTORATED SPUTUM  Final   Special Requests Normal  Final   Sputum evaluation THIS SPECIMEN IS ACCEPTABLE FOR SPUTUM CULTURE  Final   Report Status 09/04/2016 FINAL  Final  Culture, respiratory (NON-Expectorated)     Status: None   Collection Time: 09/04/16 12:04 PM  Result Value Ref Range Status   Specimen Description EXPECTORATED SPUTUM  Final   Special Requests Normal  Reflexed from [id number]  Final   Gram Stain   Final    ABUNDANT WBC PRESENT,BOTH PMN AND MONONUCLEAR RARE GRAM POSITIVE COCCI IN PAIRS    Culture   Final    Consistent with normal respiratory flora. Performed at [facility], [place] [address] [place], [place] [address]    Report Status 09/06/2016 FINAL  Final     Hospital Course: This is a 46 year old with COPD and who came to my office on the day of admission with cough and congestion. She appeared to have pneumonia so she was admitted to the hospital. She has had previous episodes of requiring intubation and mechanical ventilation. She improved very slowly on IV antibiotics IV steroids. Her blood sugar was out of control so she was started on insulin. She eventually improved enough that she was able to ambulate in the halls did not need oxygen and she was ready for discharge.  Discharge Exam: Blood pressure (!) 117/53, pulse 78, temperature 98 F (36.7 C), temperature source Oral, resp. rate 18, height 5\' 4"  (1.626 m), weight 119.2 kg (262 lb 11.2 oz), SpO2 97 %. She is awake and alert. Chest is clear. Heart is regular.  Disposition: Home  Discharge Instructions    AMB Referral to [facility]  Care Management    Complete by:  As directed    Member states she will come to office to for supplies and application for LTW DM program. Currently at [facility]. Will need LTW appointment scheduled as well. Will need post discharge call to ensure the plan. Please call with questions. Marthenia Rolling, [place], [facility] [phone]   Reason for consult:  Please assign UMR member for post dc call and LTW appointment   Diagnoses of:  Diabetes   Expected date of contact:  1-3 days (reserved for hospital discharges)        Signed: Ledarrius Beauchaine L   09/11/2016, 9:04 AM

## 2016-09-14 ENCOUNTER — Other Ambulatory Visit: Payer: Self-pay

## 2016-09-14 DIAGNOSIS — E119 Type 2 diabetes mellitus without complications: Secondary | ICD-10-CM

## 2016-09-14 DIAGNOSIS — J441 Chronic obstructive pulmonary disease with (acute) exacerbation: Secondary | ICD-10-CM

## 2016-09-14 NOTE — Patient Outreach (Signed)
[facility]) Care Management  09/14/2016  Denise Ray May 14, 1971 [id number]   Transition of care call.  Member was discharged from hospital on 09/11/16 after being hospitalized for exacerbation of COPD  Member states that she is weak but her breathing is better.  States she does get SOB when she bathes.  States she is to go see Dr. Luan Pulling tomorrow for follow up.  States she is trying to watch what she eats and is trying to eat at regular meal times instead of one large meal a day.  States she is checking her blood sugars 2-3 times a day and they have been ranging 120-201.    Transition of care call completed.  Member is to enroll in the Link to Wellness program for diabetes self management and plan to mail enrollment packet to member.  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  Member has not been on diabetes medication or doing self monitoring and needs education on diabetes self-management. Member is in the process of filing FMLA and short term disability applications.   THN CM Care Plan Problem One     Most Recent Value  Care Plan Problem One  Potential for elevated blood sugars related to dx of Type 2 DM  Role Documenting the Problem One  Care Management Coordinator  Care Plan for Problem One  Active  THN Long Term Goal (31-90 days)  Member will maintain hemoglobin A1C below 7% for the next 90 days  THN Long Term Goal Start Date  09/14/16  Interventions for Problem One Long Term Goal  Discussed Link to Wellness program with with member and mailed enrollement packet to member, Instructed to bring with her on initial visit on 10/02/16, Instructed on goals for blood sugars fasting and after meals, Instructed on portion sizes and discussed using phone applications such as Calorie [employer] or My Fitness Pal to help with CHO counting  THN CM Short Term Goal #1 (0-30 days)  Member will not be readmitted to  hospital for the next 30 days  THN CM Short Term Goal #1 Start Date  09/11/16  Interventions for Short Term Goal #1  Completed transition of care call, reviewed s/s of COPD exacerbation to call MD, Instructed to keep post hospital visit with MD tomorrow 09/15/16    Plan to call next week 09/23/16  for follow up on condition and to continue education on diabetes self-management.  Peter Garter RN, [facility] Care Management Coordinator-Link to [place] Management [phone]

## 2016-09-15 DIAGNOSIS — G4733 Obstructive sleep apnea (adult) (pediatric): Secondary | ICD-10-CM | POA: Diagnosis not present

## 2016-09-15 DIAGNOSIS — J441 Chronic obstructive pulmonary disease with (acute) exacerbation: Secondary | ICD-10-CM | POA: Diagnosis not present

## 2016-09-15 DIAGNOSIS — C210 Malignant neoplasm of anus, unspecified: Secondary | ICD-10-CM | POA: Diagnosis not present

## 2016-09-15 DIAGNOSIS — F419 Anxiety disorder, unspecified: Secondary | ICD-10-CM | POA: Diagnosis not present

## 2016-09-22 MED FILL — FLUCONAZOLE 100 MG TABLET: 100 | 10 days supply | Qty: 10 | Fill #0

## 2016-09-23 ENCOUNTER — Other Ambulatory Visit: Payer: Self-pay

## 2016-09-23 NOTE — Patient Outreach (Signed)
[facility]) Care Management  [place]  09/23/2016   Denise Ray Aug 14, 1970 [id number]  Transition of care call #2.  Member was discharged from hospital on 09/11/16 after being hospitalized for exacerbation of COPD   Subjective: Member states that she saw her MD on 09/15/16 and he did not change any of her medications.  States she is to see him again on 09/30/16.  States she is still weak and gets SOB with her ADL's and going up stairs.  States she is coughing but it is nonproductive.  States she is using her inhalers and nebulizer as directed.  States her blood sugars have improved and they range from 86-140.  States she is drinking water and is trying to eat snacks with protein such as nuts.  States she did receive the education packet that was sent to her but she has not had time to read through the material.   Encounter Medications:  Outpatient Encounter Prescriptions as of 09/23/2016  Medication Sig  . albuterol (PROVENTIL HFA;VENTOLIN HFA) 108 (90 BASE) MCG/ACT inhaler Inhale 2 puffs into the lungs every 6 (six) hours as needed. wheezing  . albuterol (PROVENTIL) (5 MG/ML) 0.5% nebulizer solution Take 2.5 mg by nebulization every 6 (six) hours as needed for wheezing or shortness of breath.  . ALPRAZolam (XANAX) 1 MG tablet Take 1 mg by mouth 2 (two) times daily.  [name] guaiFENesin (MUCINEX) 600 MG 12 hr tablet Take 1 tablet (600 mg total) by mouth 2 (two) times daily.  . imiquimod (ALDARA) 5 % cream Apply 1 application topically 3 (three) times a week.  . metFORMIN (GLUCOPHAGE XR) 500 MG 24 hr tablet Take 1 tablet (500 mg total) by mouth 2 (two) times daily.  . pantoprazole (PROTONIX) 40 MG tablet Take 40 mg by mouth every morning.   . polyethylene glycol (MIRALAX / GLYCOLAX) packet Take 17 g by mouth daily as needed for mild constipation.  [name] tiotropium (SPIRIVA HANDIHALER) 18 MCG inhalation capsule Place 1 capsule (18 mcg total) into inhaler and inhale every morning.   [name] HYDROcodone-acetaminophen (NORCO) 10-325 MG tablet Take 1 tablet by mouth every 6 (six) hours.  [name] HYDROcodone-acetaminophen (NORCO/VICODIN) 5-325 MG tablet Take 1-2 tablets by mouth every 6 (six) hours as needed for moderate pain.  . predniSONE (STERAPRED UNI-PAK 21 TAB) 10 MG (21) TBPK tablet Take by package instructions (Patient not taking: Reported on 09/23/2016)   No facility-administered encounter medications on file as of 09/23/2016.     Functional Status:  In your present state of health, do you have any difficulty performing the following activities: 08/31/2016 12/18/2015  Hearing? N N  Vision? N N  Difficulty concentrating or making decisions? N N  Walking or climbing stairs? N N  Dressing or bathing? N N  Doing errands, shopping? N -  Some recent data might be hidden      Assessment: Member is to enroll in the Link to Wellness program for diabetes self management.  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  Member has not been on diabetes medication or doing self monitoring and needs education on diabetes self-management. Member reports still having SOB with exertion and is adherent with use of inhalers and nebulizer.  Plan:  [facility] CM Care Plan Problem One     Most Recent Value  Care Plan Problem One  Potential for elevated blood sugars related to dx of Type 2 DM  Role Documenting the Problem One  Care Management Coordinator  Care Plan for Problem One  Active  THN Long Term Goal (31-90 days)  Member will maintain hemoglobin A1C below 7% for the next 90 days  THN Long Term Goal Start Date  09/14/16  Interventions for Problem One Long Term Goal  Encouraged to read through educational materials that were sent to her,  Reinforced to keep initial visit on 10/02/16, Reinforced on goals for blood sugars fasting and after meals, Instructed to try to eat snacks with protein and 15gm of CHO   THN CM Short Term Goal #1  (0-30 days)  Member will not be readmitted to hospital for the next 30 days  THN CM Short Term Goal #1 Start Date  09/11/16  Interventions for Short Term Goal #1  Reviewed s/s of COPD exacerbation to call MD, Instructed to keep visit with MD  09/30/16, Instructed to pace her activity, Instructed to drink adequate amounts of fluids, Encouraged to discuss with MD about going to Pulmonary rehab     Plan to see member on 10/02/16 for initial Link to Wellness office visit.  Peter Garter RN, [facility] Care Management Coordinator-Link to [place] Management [phone]

## 2016-09-30 ENCOUNTER — Other Ambulatory Visit (HOSPITAL_COMMUNITY)
Admission: RE | Admit: 2016-09-30 | Discharge: 2016-09-30 | Disposition: A | Discharge status: Home / Self Care | Payer: 59 | Source: Ambulatory Visit | Attending: Pulmonary Disease | Admitting: Pulmonary Disease

## 2016-09-30 DIAGNOSIS — J441 Chronic obstructive pulmonary disease with (acute) exacerbation: Secondary | ICD-10-CM | POA: Insufficient documentation

## 2016-09-30 DIAGNOSIS — G4733 Obstructive sleep apnea (adult) (pediatric): Secondary | ICD-10-CM | POA: Insufficient documentation

## 2016-09-30 DIAGNOSIS — E1165 Type 2 diabetes mellitus with hyperglycemia: Secondary | ICD-10-CM | POA: Diagnosis not present

## 2016-09-30 DIAGNOSIS — F419 Anxiety disorder, unspecified: Secondary | ICD-10-CM | POA: Insufficient documentation

## 2016-09-30 LAB — COMPREHENSIVE METABOLIC PANEL
ALBUMIN: 4.1 g/dL (ref 3.5–5.0)
ALT: 32 U/L (ref 14–54)
AST: 30 U/L (ref 15–41)
Alkaline Phosphatase: 53 U/L (ref 38–126)
Anion gap: 11 (ref 5–15)
BUN: 11 mg/dL (ref 6–20)
CALCIUM: 9.3 mg/dL (ref 8.9–10.3)
CHLORIDE: 103 mmol/L (ref 101–111)
CO2: 23 mmol/L (ref 22–32)
Creatinine, Ser: 0.72 mg/dL (ref 0.44–1.00)
GFR calc non Af Amer: >60 mL/min (ref >60)
Glucose, Bld: 128 mg/dL — ABNORMAL HIGH (ref 65–99)
POTASSIUM: 3.2 mmol/L — AB (ref 3.5–5.1)
SODIUM: 137 mmol/L (ref 135–145)
Total Bilirubin: 0.4 mg/dL (ref 0.3–1.2)
Total Protein: 7.3 g/dL (ref 6.5–8.1)

## 2016-09-30 LAB — CBC
HCT: 41.8 % (ref 36.0–46.0)
Hemoglobin: 14.2 g/dL (ref 12.0–15.0)
MCH: 30 pg (ref 26.0–34.0)
MCHC: 34 g/dL (ref 30.0–36.0)
MCV: 88.2 fL (ref 78.0–100.0)
PLATELETS: 324 10*3/uL (ref 150–400)
RBC: 4.74 MIL/uL (ref 3.87–5.11)
RDW: 15 % (ref 11.5–15.5)
WBC: 8.7 10*3/uL (ref 4.0–10.5)

## 2016-09-30 LAB — TSH: TSH: 4.648 u[IU]/mL — ABNORMAL HIGH (ref 0.350–4.500)

## 2016-09-30 MED FILL — SYMBICORT 160-4.5 MCG INH: 160-4.5 | 90 days supply | Qty: 31 | Fill #0

## 2016-10-02 ENCOUNTER — Ambulatory Visit: Payer: 59 | Admitting: Obstetrics & Gynecology

## 2016-10-02 ENCOUNTER — Other Ambulatory Visit: Payer: Self-pay

## 2016-10-02 ENCOUNTER — Encounter: Payer: Self-pay | Admitting: Obstetrics & Gynecology

## 2016-10-02 VITALS — BP 110/82 | HR 88 | Resp 18 | Ht 64.0 in | Wt 269.6 lb

## 2016-10-02 VITALS — BP 140/80 | HR 88 | Resp 16 | Ht 64.88 in | Wt 268.0 lb

## 2016-10-02 DIAGNOSIS — J441 Chronic obstructive pulmonary disease with (acute) exacerbation: Secondary | ICD-10-CM

## 2016-10-02 DIAGNOSIS — Z01411 Encounter for gynecological examination (general) (routine) with abnormal findings: Secondary | ICD-10-CM

## 2016-10-02 DIAGNOSIS — E119 Type 2 diabetes mellitus without complications: Secondary | ICD-10-CM

## 2016-10-02 MED FILL — HYDROCODON-APAP 10-325: 10-325 | 30 days supply | Qty: 120 | Fill #0

## 2016-10-02 NOTE — Progress Notes (Signed)
46 y.o. G1P1001 DivorcedCaucasianF here for annual exam.  Having significant issues with SOB due to COPD.  Followed by Dr. [name].  H/O ARDS with mechanical ventilation and RLL scarring.  Having significant coughing and wheezing.  Has not been able to return to work.  Frustrated with how she feels.     No LMP recorded. Patient has had a hysterectomy.          Sexually active: Yes.    The current method of family planning is status post hysterectomy.    Exercising: No.  The patient does not participate in regular exercise at present. Smoker:  no  Health Maintenance: Pap: 06/21/15 neg: neg HR HPV; 03/02/12 Neg. HR HPV:neg History of abnormal Pap:  Yes, h/o abnormal paps and LEEP hx MMG:  08/03/16 BIRADS1, Density BForestine Na Mammography Colonoscopy:  Never BMD:   Never TDaP:  UTD Pneumonia vaccine(s): No Zostavax:   No Hep C testing: No Screening Labs: PCP   reports that she quit smoking about 16 months ago. Her smoking use included Cigarettes. She has a 30.00 pack-year smoking history. She has never used smokeless tobacco. She reports that she does not drink alcohol or use drugs.  Past Medical History:  Diagnosis Date  . Anal lesion   . Anxiety   . Arthritis    Right hip  . Borderline diabetes mellitus   . Chronic pain   . COPD with asthma ([place])    hx exacerbation's    . GERD (gastroesophageal reflux disease)   . Hemorrhoids   . History of acute respiratory failure    06/ 2015  and 12/ 2016  CAP and Asthma exacerbation--  vented both times (ARDS)  . History of anal dysplasia    AIN 2/ 3    . History of cervical dysplasia    CIN 2 in 2000  . History of hypothyroidism   . History of pneumonia    hx Required ventilatory support  . History of TIA (transient ischemic attack)    Caused by medication reaction  . Migraines   . OSA on CPAP     Past Surgical History:  Procedure Laterality Date  . CESAREAN SECTION  2001  . CO2 LASER APPLICATION N/A 0/62/3762    Procedure: CO2 LASER APPLICATION;  Surgeon: Leighton Ruff, MD;  Location: [place];  Service: General;  Laterality: N/A;  . CONIZATION OF CERVIX  2000  . DILATION AND CURETTAGE OF UTERUS    . EVALUATION UNDER ANESTHESIA WITH HEMORRHOIDECTOMY N/A 06/15/2014   Procedure: EXAM UNDER ANESTHESIA WITH HEMORRHOIDECTOMY;  Surgeon: Doreen Salvage, MD;  Location: [place];  Service: General;  Laterality: N/A;  . EXCISION OF SKIN TAG N/A 06/15/2014   Procedure: EXCISION OF SKIN TAGS;  Surgeon: Doreen Salvage, MD;  Location: [place];  Service: General;  Laterality: N/A;  . HIGH RESOLUTION ANOSCOPY N/A 10/11/2014   Procedure: HIGH RESOLUTION ANOSCOPY WITH BIOPSY;  Surgeon: Leighton Ruff, MD;  Location: [facility];  Service: General;  Laterality: N/A;  . HYSTEROSCOPY W/D&C  09/12/2007  &  2005  . RECTAL EXAM UNDER ANESTHESIA N/A 10/11/2014   Procedure: ANAL EXAM UNDER ANESTHESIA;  Surgeon: Leighton Ruff, MD;  Location: [place];  Service: General;  Laterality: N/A;  . RECTAL EXAM UNDER ANESTHESIA N/A 12/18/2015   Procedure: ANAL EXAM UNDER ANESTHESIA  EXCISION ANAL MARGIN LESION;  Surgeon: Leighton Ruff, MD;  Location: [place];  Service: General;  Laterality: N/A;  . ROBOTIC ASSISTED TOTAL HYSTERECTOMY N/A 10/11/2012   Procedure: ROBOTIC ASSISTED TOTAL HYSTERECTOMY;  Surgeon: Lyman Speller, MD;  Location: [facility];  Service: Gynecology;  Laterality: N/A;  . SALPINGOOPHORECTOMY Bilateral 10/11/2012   Procedure: SALPINGO OOPHORECTOMY;  Surgeon: Lyman Speller, MD;  Location: [facility];  Service: Gynecology;  Laterality: Bilateral;  . TRANSTHORACIC ECHOCARDIOGRAM  05/07/2015   lvsf vigorous, ef 70-75%/  trivial MR and TR  . VIDEO ASSISTED THORACOSCOPY (VATS)/THOROCOTOMY Right 11/04/2001   w/ Resection duplication esophageal cyst    Current Outpatient Prescriptions  Medication Sig Dispense Refill  . albuterol  (PROVENTIL HFA;VENTOLIN HFA) 108 (90 BASE) MCG/ACT inhaler Inhale 2 puffs into the lungs every 6 (six) hours as needed. wheezing 1 Inhaler 11  . albuterol (PROVENTIL) (5 MG/ML) 0.5% nebulizer solution Take 2.5 mg by nebulization every 6 (six) hours as needed for wheezing or shortness of breath.    . ALPRAZolam (XANAX) 1 MG tablet Take 1 mg by mouth 2 (two) times daily.    . budesonide-formoterol (SYMBICORT) 160-4.5 MCG/ACT inhaler Inhale 2 puffs into the lungs 2 (two) times daily.    . furosemide (LASIX) 40 MG tablet Take 40 mg by mouth daily as needed.    [name] guaiFENesin (MUCINEX) 600 MG 12 hr tablet Take 1 tablet (600 mg total) by mouth 2 (two) times daily. 30 tablet 1  . HYDROcodone-acetaminophen (NORCO) 10-325 MG tablet Take 1 tablet by mouth every 6 (six) hours.  0  . imiquimod (ALDARA) 5 % cream Apply 1 application topically 3 (three) times a week.    . metFORMIN (GLUCOPHAGE XR) 500 MG 24 hr tablet Take 1 tablet (500 mg total) by mouth 2 (two) times daily. 60 tablet 12  . pantoprazole (PROTONIX) 40 MG tablet Take 40 mg by mouth every morning.     . polyethylene glycol (MIRALAX / GLYCOLAX) packet Take 17 g by mouth daily as needed for mild constipation.    [name] tiotropium (SPIRIVA HANDIHALER) 18 MCG inhalation capsule Place 1 capsule (18 mcg total) into inhaler and inhale every morning. 30 capsule 12   No current facility-administered medications for this visit.     Family History  Problem Relation Age of Onset  . Breast cancer Brother 15  . Breast cancer Maternal Aunt   . Depression Mother   . Anesthesia problems Mother        post-op N/V  . Alcohol abuse Father   . Alcohol abuse Cousin     ROS:  Pertinent items are noted in HPI.  Otherwise, a comprehensive ROS was negative.  Exam:   BP 140/80 (BP Location: Right Arm, Patient Position: Sitting, Cuff Size: Large)   Pulse 88   Resp 16   Ht 5' 4.88" (1.648 m)   Wt 268 lb (121.6 kg)   BMI 44.76 kg/m    Height: 5' 4.88" (164.8 cm)   Ht Readings from Last 3 Encounters:  10/02/16 5' 4.88" (1.648 m)  10/02/16 5\' 4"  (1.626 m)  09/01/16 5\' 4"  (1.626 m)    General appearance: alert, cooperative and appears stated age Head: Normocephalic, without obvious abnormality, atraumatic Neck: no adenopathy, supple, symmetrical, trachea midline and thyroid normal to inspection and palpation Lungs: wheezing throughout right lung Breasts: normal appearance, no masses or tenderness Heart: regular rate and rhythm Abdomen: soft, non-tender; bowel sounds normal; no masses,  no organomegaly Extremities: extremities normal, atraumatic, no cyanosis or edema Skin: Skin color, texture, turgor normal. No rashes or lesions Lymph nodes: Cervical, supraclavicular,  and axillary nodes normal. No abnormal inguinal nodes palpated Neurologic: Grossly normal   Pelvic: External genitalia:  no lesions              Urethra:  normal appearing urethra with no masses, tenderness or lesions              Bartholins and Skenes: normal                 Vagina: normal appearing vagina with normal color and discharge, no lesions              Cervix: absent              Pap taken: No. Bimanual Exam:  Uterus:  uterus absent              Adnexa: normal adnexa and no mass, fullness, tenderness               Rectovaginal: Confirms               Anus:  normal sphincter tone, no lesions  Chaperone was present for exam.  A:  Well Woman with normal exam H/O robotic TLH/bilateral salpingectomy 5/14 with h/o abnormal paps and LEEP h/o.   H/O AIN 2/3/CIN.  Currently using Aldara cream.  Using this for 12 weeks, currently on week 8.  Has follow up in two months. Asthma/COPD, coughing/wheezing today.  Decreased stamina. Former smoker, none since 2016. Hypothyroidism Brother with breast cancer.  Neg genetic testing.  P:   Mammogram yearly.  Pt is UTD. pap smear with neg HR HPV 2017.  No pap today. Referral to Dr. Elsworth Soho.  If cannot see him, then I will refer her to  [facility] or [facility] as I refuse to believe she is at her current, new baseline.  Feel very strongly she needs another opinion. Has follow up with Dr. Marcello Moores in two months. return annually or prn

## 2016-10-02 NOTE — Patient Outreach (Signed)
[facility]) Care Management   10/02/2016  Denise Ray 12-Mar-1971 [id number]  Denise Ray is an 46 y.o. female.  Member seen for initial office visit for Link to Wellness program for self management of Type 2 diabetes  Subjective: Member states that she is trying to watch what she eats and she is trying to not skip meals.  States that she saw Dr.Hawkins on Wednesday.  States she is still getting SOB with exertion and she coughs with no sputum.  States he told her that it could be a while before she goes back to work.  States her blood sugars have been getting better since she is no longer taking steroids   Objective:   Review of Systems  Respiratory: Positive for cough, shortness of breath and wheezing.     Physical Exam Today's Vitals   10/02/16 1023 10/02/16 1028  BP: 110/82   Pulse: 88   Resp: 18   SpO2: 96%   Weight: 269 lb 9.6 oz (122.3 kg)   Height: 1.626 m (5\' 4" )   PainSc: 4  4    Encounter Medications:   Outpatient Encounter Prescriptions as of 10/02/2016  Medication Sig  . albuterol (PROVENTIL HFA;VENTOLIN HFA) 108 (90 BASE) MCG/ACT inhaler Inhale 2 puffs into the lungs every 6 (six) hours as needed. wheezing  . albuterol (PROVENTIL) (5 MG/ML) 0.5% nebulizer solution Take 2.5 mg by nebulization every 6 (six) hours as needed for wheezing or shortness of breath.  . ALPRAZolam (XANAX) 1 MG tablet Take 1 mg by mouth 2 (two) times daily.  . budesonide-formoterol (SYMBICORT) 160-4.5 MCG/ACT inhaler Inhale 2 puffs into the lungs 2 (two) times daily.  . furosemide (LASIX) 40 MG tablet Take 40 mg by mouth daily as needed.  [name] guaiFENesin (MUCINEX) 600 MG 12 hr tablet Take 1 tablet (600 mg total) by mouth 2 (two) times daily.  [name] HYDROcodone-acetaminophen (NORCO) 10-325 MG tablet Take 1 tablet by mouth every 6 (six) hours.  . imiquimod (ALDARA) 5 % cream Apply 1 application topically 3 (three) times a week.  . metFORMIN (GLUCOPHAGE XR) 500 MG 24 hr tablet  Take 1 tablet (500 mg total) by mouth 2 (two) times daily.  . pantoprazole (PROTONIX) 40 MG tablet Take 40 mg by mouth every morning.   . polyethylene glycol (MIRALAX / GLYCOLAX) packet Take 17 g by mouth daily as needed for mild constipation.  [name] tiotropium (SPIRIVA HANDIHALER) 18 MCG inhalation capsule Place 1 capsule (18 mcg total) into inhaler and inhale every morning.  . [DISCONTINUED] HYDROcodone-acetaminophen (NORCO/VICODIN) 5-325 MG tablet Take 1-2 tablets by mouth every 6 (six) hours as needed for moderate pain. (Patient not taking: Reported on 10/02/2016)  . [DISCONTINUED] predniSONE (STERAPRED UNI-PAK 21 TAB) 10 MG (21) TBPK tablet Take by package instructions (Patient not taking: Reported on 09/23/2016)   No facility-administered encounter medications on file as of 10/02/2016.     Functional Status:   In your present state of health, do you have any difficulty performing the following activities: 10/02/2016 08/31/2016  Hearing? N N  Vision? N N  Difficulty concentrating or making decisions? N N  Walking or climbing stairs? Y N  Dressing or bathing? Y N  Doing errands, shopping? Y N  Some recent data might be hidden    Fall/Depression Screening:    Fall Risk  10/02/2016 06/09/2013  Falls in the past year? No No   PHQ 2/9 Scores 10/02/2016  PHQ - 2 Score 5  PHQ- 9 Score  11    Assessment:  Member seen for initial office visit for Link to Wellness program for self management of Type 2 diabetes.  Member is recovering from hospitalization for exacerbation of COPD/asthma.  Member is not meeting diabetes self management goal of hemoglobin A1C of 6.5 % or less with last reading of 6.6%.  She had a new dx of Type 2 DM but had been prediabetic.  She received diabetes self management education while in the hospital.  CBG logs with ranges of 89-126 fasting and 90-176 post prandial.  Member unable to exercise currently due to SOB.  Member reports depression related to physical disabilities and    Plan:   Plan to eat 30-45(2-3 servings) GM of carbohydrate each meal and 15 GM for snacks. Plan to check blood sugars twice a day fasting and 1  to 2 hours after eating.  Goals of 80-130 fasting  or 180 or less after meals Plan to complete EMMI programs by 11/14/16 Plan to see Dr. Luan Pulling 11/09/16 Plan for phone call with Link to Wellness on 10/08/16 at [place] to return to [place] to Wellness on 11/30/16 at [place] Problem One     Most Recent Value  Care Plan Problem One  Potential for elevated blood sugars related to dx of Type 2 DM  Role Documenting the Problem One  Care Management Coordinator  Care Plan for Problem One  Active  THN Long Term Goal (31-90 days)  Member will maintain hemoglobin A1C below 7% for the next 90 days  THN Long Term Goal Start Date  09/14/16  Interventions for Problem One Long Term Goal  Given [facility] to Wellness diabetes educational packet, Instructed on CHO counting and portion control, Given booklet on CHO counting, Instructed to contact Employee Assistance Counseling program if she feels she needs more assistance with her mood and anxiety, Encouraged to be as active as her breathing will allow, Instructed on use of diabetes medication and to be sure to take with food.  THN CM Short Term Goal #1 (0-30 days)  Member will not be readmitted to hospital for the next 30 days  THN CM Short Term Goal #1 Start Date  09/11/16  Interventions for Short Term Goal #1  Reviewed s/s of COPD exacerbation to call MD, Instructed to keep visit with MD  11/09/16, Reinforced to pace her activity, Reinforced to drink adequate amounts of fluids, Encouraged to discuss with MD about going to Pulmonary rehab     Peter Garter RN, [facility] Care Management Coordinator-Link to [place] Management [phone]

## 2016-10-02 NOTE — Patient Instructions (Signed)
1. Plan to eat 30-45(2-3 servings) GM of carbohydrate each meal and 15 GM for snacks. 2. Plan to check blood sugars twice a day fasting and 1  to 2 hours after eating.  Goals of 80-130 fasting  or 180 or less after meals 3. Plan to complete EMMI programs by 11/14/16 4. Plan to see Dr. Luan Pulling 11/09/16 5. Plan for phone call with Link to Wellness on 10/08/16 at 1PM 6. Plan to return to Link to Wellness on 11/30/16 at [facility]

## 2016-10-08 ENCOUNTER — Other Ambulatory Visit: Payer: Self-pay

## 2016-10-08 MED FILL — SPIRIVA 18 MCG CP-HANDIHALE: 18 | 30 days supply | Qty: 30 | Fill #1

## 2016-10-08 MED FILL — METFORMIN HCL ER 500 MG TAB: 500 | 30 days supply | Qty: 60 | Fill #1

## 2016-10-08 NOTE — Patient Outreach (Signed)
[facility]) Care Management  [place]  10/08/2016   Denise Ray 10-28-1970 [id number]  Transition of care call #4. Member was discharged from hospital on 09/11/16 after being hospitalized for exacerbation of COPD  Subjective: States that she wen tto see her GYN for her annual exam last week and she referred her to [facility] for a second opinion  To see if there is more that can be done for her breathing.  States she still gets SOB with any exertion.  States she has a dry cough and has coughing spells when she is active.  States her blood sugars have been good ranging 94-110.    Objective:   Encounter Medications:  Outpatient Encounter Prescriptions as of 10/08/2016  Medication Sig  . albuterol (PROVENTIL HFA;VENTOLIN HFA) 108 (90 BASE) MCG/ACT inhaler Inhale 2 puffs into the lungs every 6 (six) hours as needed. wheezing  . albuterol (PROVENTIL) (5 MG/ML) 0.5% nebulizer solution Take 2.5 mg by nebulization every 6 (six) hours as needed for wheezing or shortness of breath.  . ALPRAZolam (XANAX) 1 MG tablet Take 1 mg by mouth 2 (two) times daily.  . budesonide-formoterol (SYMBICORT) 160-4.5 MCG/ACT inhaler Inhale 2 puffs into the lungs 2 (two) times daily.  . furosemide (LASIX) 40 MG tablet Take 40 mg by mouth daily as needed.  [name] guaiFENesin (MUCINEX) 600 MG 12 hr tablet Take 1 tablet (600 mg total) by mouth 2 (two) times daily.  [name] HYDROcodone-acetaminophen (NORCO) 10-325 MG tablet Take 1 tablet by mouth every 6 (six) hours.  . imiquimod (ALDARA) 5 % cream Apply 1 application topically 3 (three) times a week.  . metFORMIN (GLUCOPHAGE XR) 500 MG 24 hr tablet Take 1 tablet (500 mg total) by mouth 2 (two) times daily.  . pantoprazole (PROTONIX) 40 MG tablet Take 40 mg by mouth every morning.   . polyethylene glycol (MIRALAX / GLYCOLAX) packet Take 17 g by mouth daily as needed for mild constipation.  [name] tiotropium (SPIRIVA HANDIHALER) 18 MCG inhalation capsule  Place 1 capsule (18 mcg total) into inhaler and inhale every morning.   No facility-administered encounter medications on file as of 10/08/2016.     Functional Status:  In your present state of health, do you have any difficulty performing the following activities: 10/02/2016 08/31/2016  Hearing? N N  Vision? N N  Difficulty concentrating or making decisions? N N  Walking or climbing stairs? Y N  Dressing or bathing? Y N  Doing errands, shopping? Y N  Some recent data might be hidden    Fall/Depression Screening: Fall Risk  10/02/2016 06/09/2013  Falls in the past year? No No   PHQ 2/9 Scores 10/02/2016  PHQ - 2 Score 5  PHQ- 9 Score 11    Assessment: Member  in the Link to Wellness program for diabetes self management. 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  Member reports still having SOB with exertion and is adherent with use of inhalers and nebulizer.  Member to see new pulmonologist on 10/30/16.  Member checking CBGs with ranges of 94-110.    Plan:  Plan to see member on 11/30/16 for Link to Wellness visit. Peter Garter RN, [facility] Care Management Coordinator-Link to [place] Management [phone]

## 2016-10-30 ENCOUNTER — Encounter: Payer: Self-pay | Admitting: Pulmonary Disease

## 2016-10-30 ENCOUNTER — Ambulatory Visit (INDEPENDENT_AMBULATORY_CARE_PROVIDER_SITE_OTHER): Payer: 59 | Admitting: Pulmonary Disease

## 2016-10-30 VITALS — BP 128/70 | HR 90 | Ht 64.0 in | Wt 268.6 lb

## 2016-10-30 DIAGNOSIS — J441 Chronic obstructive pulmonary disease with (acute) exacerbation: Secondary | ICD-10-CM | POA: Diagnosis not present

## 2016-10-30 DIAGNOSIS — R05 Cough: Secondary | ICD-10-CM

## 2016-10-30 DIAGNOSIS — R0602 Shortness of breath: Secondary | ICD-10-CM | POA: Diagnosis not present

## 2016-10-30 DIAGNOSIS — R059 Cough, unspecified: Secondary | ICD-10-CM

## 2016-10-30 MED ORDER — BENZONATATE 200 MG PO CAPS: 200.0000 mg | ORAL_CAPSULE | Freq: Three times a day (TID) | ORAL | 0 refills | Status: DC | PRN

## 2016-10-30 MED FILL — BENZONATATE 200 MG CAP: 200 | 15 days supply | Qty: 45 | Fill #0

## 2016-10-30 MED FILL — HYDROCODON-APAP 10-325: 10-325 | 30 days supply | Qty: 120 | Fill #0

## 2016-10-30 NOTE — Assessment & Plan Note (Signed)
Denise Ray has had another hospitalization for a presumed COPD exacerbation. She has an extensive past tobacco history use and is at high risk for having COPD. However, despite multiple hospitalizations for COPD she's never had spirometry testing to confirm the diagnosis. Unfortunately she is coughing too frequently today for rest to consider doing spirometry testing that we need to get this done soon.  Plan: Arrange for full pulmonary function test We'll treat cough as detailed below prior Continue Symbicort and Spiriva

## 2016-10-30 NOTE — Patient Instructions (Signed)
Take the tessalon perles q8h as needed for cough You need to try to suppress your cough to allow your larynx (voice box) to heal.  For three days don't talk, laugh, sing, or clear your throat. Do everything you can to suppress the cough during this time. Use hard candies (sugarless Jolly Ranchers) or non-mint or non-menthol containing cough drops during this time to soothe your throat.  Use a cough suppressant (Delsym or what I have prescribed you) around the clock during this time.  After three days, gradually increase the use of your voice and back off on the cough suppressants.  We will arrange for a PFT at [facility] Next week  Keep taking your inhaled medicines as you are doing  We will see you back in ~2-4 weeks with an NP or myself

## 2016-10-30 NOTE — Assessment & Plan Note (Signed)
She has a persistent frequent cough which is nonproductive. On physical exam today she has no wheezing to suggest an active COPD exacerbation. She denies postnasal drip or acid reflux symptoms. Chest x-ray he suddenly showed no evidence of an active infection.  The most likely cause of her cough is irritable larynx leading to sickle cough symptoms.  Plan: Tessalon prescribed Voice rest encouraged

## 2016-10-30 NOTE — Assessment & Plan Note (Signed)
>>  ASSESSMENT AND PLAN FOR COUGH WRITTEN ON 10/30/2016  5:14 PM BY MCQUAID, DOUGLAS B, MD  She has a persistent frequent cough which is nonproductive. On physical exam today she has no wheezing to suggest an active COPD exacerbation. She denies postnasal drip or acid reflux symptoms. Chest x-ray he suddenly showed no evidence of an active infection.  The most likely cause of her cough is irritable larynx leading to sickle cough symptoms.  Plan: Tessalon prescribed Voice rest encouraged

## 2016-10-30 NOTE — Progress Notes (Signed)
Subjective:    Patient ID: Denise Ray, female    DOB: [birth year], 46 y.o.   MRN: [id number]  HPI  Chief Complaint  Patient presents with  . Pulmonary Consult    Pt reports of dyspnea, she is a former [employer] pt   Denise Ray is here today to follow-up with me after recent hospitalization for a COPD exacerbation. She tells me that she has had a normal childhood without respiratory illnesses but around age 59 after living in a hot environment in an apartment with heavy exposure one day to exhaust she ended up in an emergency room and was diagnosed with asthma. After that she never really had much in the way of problems breathing until a few years ago when after performing fit testing at the hospital for 3 days and her she developed progressive respiratory failure. She was intubated for pneumonia in 2015 and was felt to have a picture consistent with ARDS. She then required intubation once again for respiratory failure in 2016.  Since 2015 she's had significant trouble with shortness of breath with exertion and cough. She tells me today that she's been experiencing shortness of breath per day much whenever she exerts her self. This is been significant ever since March. At that time she developed some sort of a respiratory infection and was seen by a physician and prescribed a steroid injection as well as an antibiotic which initially helped. However, about one month later she performed fit testing again in the hospital and within a few days she was hospitalized for acute wheezing, shortness of breath. She required systemic steroids and breathing treatments and was hospitalized for over one week.  She says that ever since her hospitalization she has been experiencing a dry cough which is near constant. She says that moving around makes it worse. Albuterol helps some. She states that she does not produce any mucus.  She also notes a sensation of "a cold burn" in her right chest. This has been present pretty  much ever since her hospitalization earlier this year.  During her hospitalization she had a CT scan of the chest which showed significant air trapping but no evidence of a pulmonary embolism.  She tells me that she smoked up to 2 packs of cigarettes daily for at least 25 years. She quit at age 39. She has not started smoking since then. She started smoking as young as age 75.  She has not had changes in her environment at home she was in a new mobile home which she purchased 4 years ago. There is been no water damage to the home. She has cats and dogs in the home which she said she's had for many years.  She denies eczema symptoms, acid reflux, or postnasal drip or sinus congestion.  Dyspnea: > says it happens whenever she exerts herself > started end of March > she says that she was given a steroid injection, antibiotic  She has a history of intubation for CAP: > normal childhood  Asthma: > diagnosed in the ER age 40 > she was living in an apartment which was poorly ventilated and had exhaust fumes > never had   No hay fever, no exzema  She started smoking non-filter cigarettes age 13, she quit age 89.  The smoked 2 ppd for 25 years.  Cough: > albuterol helps > near constant > moving around makes it worse > worse with talking   > she feels like there is a "cold burn" in  her right lung in the base which hurts a  She takes albuterol 2-4 times per day.  She is currently taking Symbicort and Spiriva.  > she thinks that this helps   Past Medical History:  Diagnosis Date  . Anal lesion   . Anxiety   . Arthritis    Right hip  . Borderline diabetes mellitus   . Chronic pain   . COPD with asthma ([place])    hx exacerbation's    . Diabetes mellitus without complication ([place])   . GERD (gastroesophageal reflux disease)   . Hemorrhoids   . History of acute respiratory failure    06/ 2015  and 12/ 2016  CAP and Asthma exacerbation--  vented both times (ARDS)  . History of anal  dysplasia    AIN 2/ 3    . History of cervical dysplasia    CIN 2 in 2000  . History of hypothyroidism   . History of pneumonia    hx Required ventilatory support  . History of TIA (transient ischemic attack)    Caused by medication reaction  . Migraines   . OSA on CPAP      Family History  Problem Relation Age of Onset  . Breast cancer Brother 15  . Breast cancer Maternal Aunt   . Depression Mother   . Anesthesia problems Mother        post-op N/V  . Alcohol abuse Father   . Alcohol abuse Cousin      Social History   Social History  . Marital status: Divorced    Spouse name: N/A  . Number of children: 1  . Years of education: N/A   Occupational History  . Respiratory Therapist [employer]   Social History Main Topics  . Smoking status: Former Smoker    Packs/day: 1.00    Years: 30.00    Types: Cigarettes    Quit date: 05/06/2015  . Smokeless tobacco: Never Used  . Alcohol use No     Comment: Rarely  . Drug use: No  . Sexual activity: Yes    Partners: Male    Birth control/ protection: Surgical     Comment: Hysterectomy   Other Topics Concern  . Not on file   Social History Narrative   Divorced.  Mother and son live with her.     Allergies  Allergen Reactions  . Tamiflu Other (See Comments) and Cough    BRONCHOCONSTRICTION  . Estrogens Other (See Comments)    SYMPTOMS OF A STROKE  . Wellbutrin [Bupropion] Other (See Comments)    CAUSED TIA WHEN TAKEN WITH ESTROGEN  . Ibuprofen Other (See Comments)    Avoids due to nosebleeds  . Morphine And Related Other (See Comments)    DOES NOT RELIEVE PAIN     Outpatient Medications Prior to Visit  Medication Sig Dispense Refill  . albuterol (PROVENTIL HFA;VENTOLIN HFA) 108 (90 BASE) MCG/ACT inhaler Inhale 2 puffs into the lungs every 6 (six) hours as needed. wheezing 1 Inhaler 11  . albuterol (PROVENTIL) (5 MG/ML) 0.5% nebulizer solution Take 2.5 mg by nebulization every 6 (six) hours as  needed for wheezing or shortness of breath.    . ALPRAZolam (XANAX) 1 MG tablet Take 1 mg by mouth 2 (two) times daily.    . budesonide-formoterol (SYMBICORT) 160-4.5 MCG/ACT inhaler Inhale 2 puffs into the lungs 2 (two) times daily.    . furosemide (LASIX) 40 MG tablet Take 40 mg by mouth daily as needed.    [name]  guaiFENesin (MUCINEX) 600 MG 12 hr tablet Take 1 tablet (600 mg total) by mouth 2 (two) times daily. 30 tablet 1  . HYDROcodone-acetaminophen (NORCO) 10-325 MG tablet Take 1 tablet by mouth every 6 (six) hours.  0  . imiquimod (ALDARA) 5 % cream Apply 1 application topically 3 (three) times a week.    . metFORMIN (GLUCOPHAGE XR) 500 MG 24 hr tablet Take 1 tablet (500 mg total) by mouth 2 (two) times daily. 60 tablet 12  . pantoprazole (PROTONIX) 40 MG tablet Take 40 mg by mouth every morning.     . polyethylene glycol (MIRALAX / GLYCOLAX) packet Take 17 g by mouth daily as needed for mild constipation.    [name] tiotropium (SPIRIVA HANDIHALER) 18 MCG inhalation capsule Place 1 capsule (18 mcg total) into inhaler and inhale every morning. 30 capsule 12   No facility-administered medications prior to visit.      Review of Systems  Constitutional: Negative for fever and unexpected weight change.  HENT: Negative for congestion, dental problem, ear pain, nosebleeds, postnasal drip, rhinorrhea, sinus pressure, sneezing, sore throat and trouble swallowing.   Eyes: Negative for redness and itching.  Respiratory: Positive for cough and shortness of breath. Negative for chest tightness and wheezing.   Cardiovascular: Negative for palpitations and leg swelling.  Gastrointestinal: Negative for nausea and vomiting.  Genitourinary: Negative for dysuria.  Musculoskeletal: Negative for joint swelling.  Skin: Negative for rash.  Allergic/Immunologic: Negative.  Negative for environmental allergies, food allergies and immunocompromised state.  Neurological: Negative for headaches.  Hematological: Does  not bruise/bleed easily.  Psychiatric/Behavioral: Negative for dysphoric mood. The patient is not nervous/anxious.        Objective:   Physical Exam Vitals:   10/30/16 1442  BP: 128/70  Pulse: 90  SpO2: 97%  Weight: 268 lb 9.6 oz (121.8 kg)  Height: 5\' 4"  (1.626 m)   RA  Gen: well appearing, no acute distress, frequent cough HENT: NCAT, OP clear, neck supple without masses Eyes: PERRL, EOMi Lymph: no cervical lymphadenopathy PULM: CTA B CV: RRR, no mgr, no JVD GI: BS+, soft, nontender, no hsm Derm: no rash or skin breakdown MSK: normal bulk and tone Neuro: A&Ox4, CN II-XII intact, strength 5/5 in all 4 extremities Psyche: normal mood and affect   Chest imaging: May 2018 CT scan of the chest with angiogram: No pulmonary embolism seen, some air trapping noted, subsegmental atelectasis noted, no emphysema noted, images independently reviewed and interpreted by me.  CBC    Component Value Date/Time   WBC 8.7 09/30/2016 1635   RBC 4.74 09/30/2016 1635   HGB 14.2 09/30/2016 1635   HCT 41.8 09/30/2016 1635   PLT 324 09/30/2016 1635   MCV 88.2 09/30/2016 1635   MCV 86.3 01/06/2013 1634   MCH 30.0 09/30/2016 1635   MCHC 34.0 09/30/2016 1635   RDW 15.0 09/30/2016 1635   LYMPHSABS 1.2 09/09/2016 0442   MONOABS 0.8 09/09/2016 0442   EOSABS 0.0 09/09/2016 0442   BASOSABS 0.0 09/09/2016 0442        Assessment & Plan:  COPD exacerbation ([place]) Tabitha has had another hospitalization for a presumed COPD exacerbation. She has an extensive past tobacco history use and is at high risk for having COPD. However, despite multiple hospitalizations for COPD she's never had spirometry testing to confirm the diagnosis. Unfortunately she is coughing too frequently today for rest to consider doing spirometry testing that we need to get this done soon.  Plan: Arrange for full pulmonary  function test We'll treat cough as detailed below prior Continue Symbicort and Spiriva  Cough She  has a persistent frequent cough which is nonproductive. On physical exam today she has no wheezing to suggest an active COPD exacerbation. She denies postnasal drip or acid reflux symptoms. Chest x-ray he suddenly showed no evidence of an active infection.  The most likely cause of her cough is irritable larynx leading to sickle cough symptoms.  Plan: Tessalon prescribed Voice rest encouraged    Current Outpatient Prescriptions:  .  albuterol (PROVENTIL HFA;VENTOLIN HFA) 108 (90 BASE) MCG/ACT inhaler, Inhale 2 puffs into the lungs every 6 (six) hours as needed. wheezing, Disp: 1 Inhaler, Rfl: 11 .  albuterol (PROVENTIL) (5 MG/ML) 0.5% nebulizer solution, Take 2.5 mg by nebulization every 6 (six) hours as needed for wheezing or shortness of breath., Disp: , Rfl:  .  ALPRAZolam (XANAX) 1 MG tablet, Take 1 mg by mouth 2 (two) times daily., Disp: , Rfl:  .  budesonide-formoterol (SYMBICORT) 160-4.5 MCG/ACT inhaler, Inhale 2 puffs into the lungs 2 (two) times daily., Disp: , Rfl:  .  furosemide (LASIX) 40 MG tablet, Take 40 mg by mouth daily as needed., Disp: , Rfl:  .  guaiFENesin (MUCINEX) 600 MG 12 hr tablet, Take 1 tablet (600 mg total) by mouth 2 (two) times daily., Disp: 30 tablet, Rfl: 1 .  HYDROcodone-acetaminophen (NORCO) 10-325 MG tablet, Take 1 tablet by mouth every 6 (six) hours., Disp: , Rfl: 0 .  imiquimod (ALDARA) 5 % cream, Apply 1 application topically 3 (three) times a week., Disp: , Rfl:  .  metFORMIN (GLUCOPHAGE XR) 500 MG 24 hr tablet, Take 1 tablet (500 mg total) by mouth 2 (two) times daily., Disp: 60 tablet, Rfl: 12 .  pantoprazole (PROTONIX) 40 MG tablet, Take 40 mg by mouth every morning. , Disp: , Rfl:  .  polyethylene glycol (MIRALAX / GLYCOLAX) packet, Take 17 g by mouth daily as needed for mild constipation., Disp: , Rfl:  .  tiotropium (SPIRIVA HANDIHALER) 18 MCG inhalation capsule, Place 1 capsule (18 mcg total) into inhaler and inhale every morning., Disp: 30  capsule, Rfl: 12 .  benzonatate (TESSALON) 200 MG capsule, Take 1 capsule (200 mg total) by mouth every 8 (eight) hours as needed for cough., Disp: 45 capsule, Rfl: 0

## 2016-11-02 MED FILL — ALBUTEROL 0.083% INHAL SOLN: (2.5 MG/3ML | 15 days supply | Qty: 180 | Fill #0

## 2016-11-04 MED FILL — FREESTYLE LITE TEST STRIP: 50 days supply | Qty: 100 | Fill #0

## 2016-11-04 MED FILL — FREESTYLE LANCETS: 50 days supply | Qty: 100 | Fill #0

## 2016-11-09 DIAGNOSIS — J449 Chronic obstructive pulmonary disease, unspecified: Secondary | ICD-10-CM | POA: Diagnosis not present

## 2016-11-09 DIAGNOSIS — D013 Carcinoma in situ of anus and anal canal: Secondary | ICD-10-CM | POA: Diagnosis not present

## 2016-11-09 DIAGNOSIS — G4733 Obstructive sleep apnea (adult) (pediatric): Secondary | ICD-10-CM | POA: Diagnosis not present

## 2016-11-09 DIAGNOSIS — E669 Obesity, unspecified: Secondary | ICD-10-CM | POA: Diagnosis not present

## 2016-11-09 DIAGNOSIS — C210 Malignant neoplasm of anus, unspecified: Secondary | ICD-10-CM | POA: Diagnosis not present

## 2016-11-10 ENCOUNTER — Ambulatory Visit (INDEPENDENT_AMBULATORY_CARE_PROVIDER_SITE_OTHER): Payer: 59 | Admitting: Pulmonary Disease

## 2016-11-10 DIAGNOSIS — R0602 Shortness of breath: Secondary | ICD-10-CM

## 2016-11-10 LAB — PULMONARY FUNCTION TEST
FEF 25-75 PRE: 1.64 L/s
FEF2575-%PRED-PRE: 54 %
FEV1-%Pred-Pre: 46 %
FEV1-Pre: 1.36 L
FEV1FVC-%Pred-Pre: 105 %
FEV6-%Pred-Pre: 44 %
FEV6-Pre: 1.59 L
FEV6FVC-%PRED-PRE: 102 %
FVC-%PRED-PRE: 43 %
FVC-Pre: 1.59 L
Pre FEV1/FVC ratio: 86 %
Pre FEV6/FVC Ratio: 100 %

## 2016-11-10 NOTE — Progress Notes (Signed)
PFT not completed today. Pt could not complete spirometry due to chest discomfort, wheezing and coughing. Per BQ - PFT did not need to be completed.

## 2016-11-11 MED FILL — METFORMIN HCL ER 500 MG TAB: 500 | 30 days supply | Qty: 60 | Fill #2

## 2016-11-11 MED FILL — SPIRIVA 18 MCG CP-HANDIHALE: 18 | 30 days supply | Qty: 30 | Fill #2

## 2016-11-24 MED FILL — PANTOPRAZOLE SOD DR 40 MG T: 40 | 90 days supply | Qty: 90 | Fill #2

## 2016-11-25 ENCOUNTER — Ambulatory Visit (INDEPENDENT_AMBULATORY_CARE_PROVIDER_SITE_OTHER): Payer: 59 | Admitting: Adult Health

## 2016-11-25 ENCOUNTER — Other Ambulatory Visit (INDEPENDENT_AMBULATORY_CARE_PROVIDER_SITE_OTHER): Payer: 59

## 2016-11-25 ENCOUNTER — Encounter: Payer: Self-pay | Admitting: Adult Health

## 2016-11-25 VITALS — BP 110/64 | HR 85 | Ht 64.0 in | Wt 265.0 lb

## 2016-11-25 DIAGNOSIS — J441 Chronic obstructive pulmonary disease with (acute) exacerbation: Secondary | ICD-10-CM | POA: Diagnosis not present

## 2016-11-25 DIAGNOSIS — R05 Cough: Secondary | ICD-10-CM

## 2016-11-25 DIAGNOSIS — R059 Cough, unspecified: Secondary | ICD-10-CM

## 2016-11-25 LAB — CBC WITH DIFFERENTIAL/PLATELET
BASOS PCT: 1.3 % (ref 0.0–3.0)
Basophils Absolute: 0.1 10*3/uL (ref 0.0–0.1)
EOS PCT: 1.8 % (ref 0.0–5.0)
Eosinophils Absolute: 0.2 10*3/uL (ref 0.0–0.7)
HCT: 38.8 % (ref 36.0–46.0)
HEMOGLOBIN: 13.2 g/dL (ref 12.0–15.0)
LYMPHS ABS: 2.7 10*3/uL (ref 0.7–4.0)
Lymphocytes Relative: 29.4 % (ref 12.0–46.0)
MCHC: 34 g/dL (ref 30.0–36.0)
MCV: 86.7 fl (ref 78.0–100.0)
MONO ABS: 0.7 10*3/uL (ref 0.1–1.0)
Monocytes Relative: 8 % (ref 3.0–12.0)
NEUTROS ABS: 5.4 10*3/uL (ref 1.4–7.7)
Neutrophils Relative %: 59.5 % (ref 43.0–77.0)
PLATELETS: 310 10*3/uL (ref 150.0–400.0)
RBC: 4.47 Mil/uL (ref 3.87–5.11)
RDW: 14.1 % (ref 11.5–15.5)
WBC: 9.1 10*3/uL (ref 4.0–10.5)

## 2016-11-25 NOTE — Patient Instructions (Addendum)
Continue on Symbicort and Spiriva, rinse after use. Add Zyrtec 10 mg at bedtime Begin Pepcid 10mg  at bedtime. Delsym twice daily as needed for cough. Flutter valve As needed  Cough .  Refer to ENT  Tessalon 3 times a day as needed for cough. Labs today .  Follow up with Dr. Lake Bells in 3 months and As needed   Please contact office for sooner follow up if symptoms do not improve or worsen or seek emergency care

## 2016-11-25 NOTE — Progress Notes (Signed)
@Patient  ID: Denise Ray, female    DOB: 1970/12/09, 46 y.o.   MRN: [id number]  Chief Complaint  Patient presents with  . Follow-up    Asthma     Referring provider: [name], MD  HPI: 45 year old female former smoker seen for pulmonary consult 10/30/2016 for dyspnea and possible COPD Severe critical illness with pneumonia. 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    11/26/2016 Follow up : COPD /Dyspnea .Albin Fischer  Patient presents for a one-month follow-up. Patient was seen last month for pulmonary consult. Patient has a presumed diagnosis of COPD with previous history of smoking and several hospitalizations for COPD exacerbation. Patient had 2 critical illnesses in 2015 and 2016 where she required vent support. She complained of increased shortness of breath and cough since 2015 Last hospitalization was April 2018 for COPD exacerbation.  CT chest 09/06/2016 showed no PE, air trapping.  Patient was recommended to continue on Symbicort and Spiriva. She was set up for pulmonary function test. Unfortunate patient was unable to complete her pulmonary function test. She was also recommended on a cough suppression regimen with delsym and tessalon . Denise Ray Patient returns today with some improvement but still coughing and intermittent wheezing .  Denies GERD . On PPI.  Denies drainage .  Spirometry today shows Mild restriction with no significant obstruction . FVC 71% , FEV1 72%. Ratio 77.   Allergies  Allergen Reactions  . Tamiflu Other (See Comments) and Cough    BRONCHOCONSTRICTION  . Estrogens Other (See Comments)    SYMPTOMS OF A STROKE  . Wellbutrin [Bupropion] Other (See Comments)    CAUSED TIA WHEN TAKEN WITH ESTROGEN  . Ibuprofen Other (See  Comments)    Avoids due to nosebleeds  . Morphine And Related Other (See Comments)    DOES NOT RELIEVE PAIN    Immunization History  Administered Date(s) Administered  . Influenza Split 03/18/2013  . Influenza,inj,Quad PF,36+ Mos 02/16/2016    Past Medical History:  Diagnosis Date  . Anal lesion   . Anxiety   . Arthritis    Right hip  . Borderline diabetes mellitus   . Chronic pain   . COPD with asthma (Denise Ray)    hx exacerbation's    . Diabetes mellitus without complication ([name])   . GERD (gastroesophageal reflux disease)   . Hemorrhoids   . History of acute respiratory failure    06/ 2015  and 12/ 2016  CAP and Asthma exacerbation--  vented both times (ARDS)  . History of anal dysplasia    AIN 2/ 3    . History of cervical dysplasia    CIN 2 in 2000  . History of hypothyroidism   . History of pneumonia    hx Required ventilatory support  . History of TIA (transient ischemic attack)    Caused by medication reaction  . Migraines   . OSA on CPAP     Tobacco History: History  Smoking Status  . Former Smoker  . Packs/day: 1.00  . Years: 30.00  . Types: Cigarettes  . Quit date: 05/06/2015  Smokeless Tobacco  . Never Used   Counseling given: Not Answered   Outpatient Encounter Prescriptions as of 11/25/2016  Medication Sig  . albuterol (PROVENTIL HFA;VENTOLIN HFA) 108 (90 BASE) MCG/ACT inhaler Inhale 2 puffs into the  lungs every 6 (six) hours as needed. wheezing  . albuterol (PROVENTIL) (5 MG/ML) 0.5% nebulizer solution Take 2.5 mg by nebulization every 6 (six) hours as needed for wheezing or shortness of breath.  . ALPRAZolam (XANAX) 1 MG tablet Take 1 mg by mouth 2 (two) times daily.  . budesonide-formoterol (SYMBICORT) 160-4.5 MCG/ACT inhaler Inhale 2 puffs into the lungs 2 (two) times daily.  . furosemide (LASIX) 40 MG tablet Take 40 mg by mouth daily as needed.  Denise Ray guaiFENesin (MUCINEX) 600 MG 12 hr tablet Take 1 tablet (600 mg total) by mouth 2 (two) times  daily.  Denise Ray HYDROcodone-acetaminophen (NORCO) 10-325 MG tablet Take 1 tablet by mouth every 6 (six) hours.  . metFORMIN (GLUCOPHAGE XR) 500 MG 24 hr tablet Take 1 tablet (500 mg total) by mouth 2 (two) times daily.  . pantoprazole (PROTONIX) 40 MG tablet Take 40 mg by mouth every morning.   . polyethylene glycol (MIRALAX / GLYCOLAX) packet Take 17 g by mouth daily as needed for mild constipation.  Denise Ray tiotropium (SPIRIVA HANDIHALER) 18 MCG inhalation capsule Place 1 capsule (18 mcg total) into inhaler and inhale every morning.  . benzonatate (TESSALON) 200 MG capsule Take 1 capsule (200 mg total) by mouth every 8 (eight) hours as needed for cough. (Patient not taking: Reported on 11/25/2016)  . imiquimod (ALDARA) 5 % cream Apply 1 application topically 3 (three) times a week. **ON HOLD**   No facility-administered encounter medications on file as of 11/25/2016.      Review of Systems  Constitutional:   No  weight loss, night sweats,  Fevers, chills, fatigue, or  lassitude.  HEENT:   No headaches,  Difficulty swallowing,  Tooth/dental problems, or  Sore throat,                No sneezing, itching, ear ache, nasal congestion, post nasal drip,   CV:  No chest pain,  Orthopnea, PND, swelling in lower extremities, anasarca, dizziness, palpitations, syncope.   GI  No heartburn, indigestion, abdominal pain, nausea, vomiting, diarrhea, change in bowel habits, loss of appetite, bloody stools.   Resp:   No chest wall deformity  Skin: no rash or lesions.  GU: no dysuria, change in color of urine, no urgency or frequency.  No flank pain, no hematuria   MS:  No joint pain or swelling.  No decreased range of motion.  No back pain.    Physical Exam  BP 110/64 (BP Location: Left Arm, Cuff Size: Large)   Pulse 85   Ht 5\' 4"  (1.626 m)   Wt 265 lb (120.2 kg)   SpO2 97%   BMI 45.49 kg/m   GEN: A/Ox3; pleasant , NAD, obese    HEENT:  [place]/AT,  EACs-clear, TMs-wnl, NOSE-clear, THROAT-clear, no  lesions, no postnasal drip or exudate noted. Class 2-3 MP airway   NECK:  Supple w/ fair ROM; no JVD; normal carotid impulses w/o bruits; no thyromegaly or nodules palpated; no lymphadenopathy.  NO stridor   RESP  Clear  P & A; w/o, wheezes/ rales/ or rhonchi. no accessory muscle use, no dullness to percussion  + upper airway psuedowheezing on forced expiration   CARD:  RRR, no m/r/g, no peripheral edema, pulses intact, no cyanosis or clubbing.  GI:   Soft & nt; nml bowel sounds; no organomegaly or masses detected.   Musco: Warm bil, no deformities or joint swelling noted.   Neuro: alert, no focal deficits noted.    Skin: Warm, no lesions or  rashes     Lab Results:  CBC    Component Value Date/Time   WBC 9.1 11/25/2016 1201   RBC 4.47 11/25/2016 1201   HGB 13.2 11/25/2016 1201   HCT 38.8 11/25/2016 1201   PLT 310.0 11/25/2016 1201   MCV 86.7 11/25/2016 1201   MCV 86.3 01/06/2013 1634   MCH 30.0 09/30/2016 1635   MCHC 34.0 11/25/2016 1201   RDW 14.1 11/25/2016 1201   LYMPHSABS 2.7 11/25/2016 1201   MONOABS 0.7 11/25/2016 1201   EOSABS 0.2 11/25/2016 1201   BASOSABS 0.1 11/25/2016 1201    BMET    Component Value Date/Time   NA 137 09/30/2016 1635   NA 140 01/06/2013 1314   K 3.2 (L) 09/30/2016 1635   CL 103 09/30/2016 1635   CO2 23 09/30/2016 1635   GLUCOSE 128 (H) 09/30/2016 1635   BUN 11 09/30/2016 1635   BUN 7 01/06/2013 1314   CREATININE 0.72 09/30/2016 1635   CALCIUM 9.3 09/30/2016 1635   GFRNONAA >60 09/30/2016 1635   GFRAA >60 09/30/2016 1635    BNP    Component Value Date/Time   BNP 22.0 05/06/2015 1024    ProBNP    Component Value Date/Time   PROBNP 20.2 01/08/2014 1415    Imaging: No results found.   Assessment & Plan:   Cough Chronic cough , upper airway psuedowheezing ? VCD component . Pt has had 2 critical illnes with vent support plus various surgeries in past ( w/ intubation) may have some VC issues . May FOB for direct  inspection if not resolving  Control for triggers.   Plan  Patient Instructions  Continue on Symbicort and Spiriva, rinse after use. Add Zyrtec 10 mg at bedtime Begin Pepcid 10mg  at bedtime. Delsym twice daily as needed for cough. Flutter valve As needed  Cough .  Refer to ENT  Tessalon 3 times a day as needed for cough. Labs today .  Follow up with Dr. Lake Bells in 3 months and As needed   Please contact office for sooner follow up if symptoms do not improve or worsen or seek emergency care       COPD exacerbation ([place]) Frequent dx of COPD flare with past admission .  Simple spirometry shows more restriction . NO obstruction noted. She may have more of asthmatic component. Continue inhalers for now.  cotnrol for triggers, refer to ENT  Labs for allergy profile, and IgE .   Plan  Patient Instructions  Continue on Symbicort and Spiriva, rinse after use. Add Zyrtec 10 mg at bedtime Begin Pepcid 10mg  at bedtime. Delsym twice daily as needed for cough. Flutter valve As needed  Cough .  Refer to ENT  Tessalon 3 times a day as needed for cough. Labs today .  Follow up with Dr. Lake Bells in 3 months and As needed   Please contact office for sooner follow up if symptoms do not improve or worsen or seek emergency care          Rexene Edison, NP 11/26/2016

## 2016-11-26 LAB — RESPIRATORY ALLERGY PROFILE REGION II ~~LOC~~
Allergen, A. alternata, m6: <0.1 kU/L
Allergen, Cedar tree, t12: <0.1 kU/L
Allergen, Mouse Urine Protein, e78: <0.1 kU/L
Allergen, Oak,t7: <0.1 kU/L
Allergen, P. notatum, m1: <0.1 kU/L
Box Elder IgE: <0.1 kU/L
Cat Dander: 0.18 kU/L — ABNORMAL HIGH
IgE (Immunoglobulin E), Serum: 57 kU/L (ref <115)
Rough Pigweed  IgE: <0.1 kU/L
Sheep Sorrel IgE: <0.1 kU/L
TIMOTHY GRASS: 0.59 kU/L — AB

## 2016-11-26 NOTE — Assessment & Plan Note (Signed)
Frequent dx of COPD flare with past admission .  Simple spirometry shows more restriction . NO obstruction noted. She may have more of asthmatic component. Continue inhalers for now.  cotnrol for triggers, refer to ENT  Labs for allergy profile, and IgE .   Plan  Patient Instructions  Continue on Symbicort and Spiriva, rinse after use. Add Zyrtec 10 mg at bedtime Begin Pepcid 10mg  at bedtime. Delsym twice daily as needed for cough. Flutter valve As needed  Cough .  Refer to ENT  Tessalon 3 times a day as needed for cough. Labs today .  Follow up with Dr. Lake Bells in 3 months and As needed   Please contact office for sooner follow up if symptoms do not improve or worsen or seek emergency care

## 2016-11-26 NOTE — Assessment & Plan Note (Signed)
>>  ASSESSMENT AND PLAN FOR COUGH WRITTEN ON 11/26/2016  9:57 AM BY PARRETT, TAMMY S, NP  Chronic cough , upper airway psuedowheezing ? VCD component . Pt has had 2 critical illnes with vent support plus various surgeries in past ( w/ intubation) may have some VC issues . May FOB for direct inspection if not resolving  Control for triggers.   Plan  Patient Instructions  Continue on Symbicort and Spiriva, rinse after use. Add Zyrtec 10 mg at bedtime Begin Pepcid 48m at bedtime. Delsym twice daily as needed for cough. Flutter valve As needed  Cough .  Refer to ENT  Tessalon 3 times a day as needed for cough. Labs today .  Follow up with Dr. MLake Bellsin 3 months and As needed   Please contact office for sooner follow up if symptoms do not improve or worsen or seek emergency care

## 2016-11-26 NOTE — Assessment & Plan Note (Signed)
Chronic cough , upper airway psuedowheezing ? VCD component . Pt has had 2 critical illnes with vent support plus various surgeries in past ( w/ intubation) may have some VC issues . May FOB for direct inspection if not resolving  Control for triggers.   Plan  Patient Instructions  Continue on Symbicort and Spiriva, rinse after use. Add Zyrtec 10 mg at bedtime Begin Pepcid 10mg  at bedtime. Delsym twice daily as needed for cough. Flutter valve As needed  Cough .  Refer to ENT  Tessalon 3 times a day as needed for cough. Labs today .  Follow up with Dr. Lake Bells in 3 months and As needed   Please contact office for sooner follow up if symptoms do not improve or worsen or seek emergency care

## 2016-11-27 MED FILL — HYDROCODON-APAP 10-325: 10-325 | 30 days supply | Qty: 120 | Fill #0

## 2016-11-27 NOTE — Progress Notes (Signed)
Spoke with pt about labwork. Pt expressed understanding. Nothing further needed at this time.

## 2016-11-27 NOTE — Progress Notes (Signed)
Reviewed, agree 

## 2016-11-30 ENCOUNTER — Ambulatory Visit: Payer: Self-pay

## 2016-11-30 MED FILL — ALPRAZolam 1 MG TABS: 1 | 90 days supply | Qty: 180 | Fill #0

## 2016-12-08 MED FILL — TOBRAMYCIN-DEXAMETH OPTH SU: 0.3-0.1 | 12 days supply | Qty: 5 | Fill #0

## 2016-12-09 DIAGNOSIS — J45909 Unspecified asthma, uncomplicated: Secondary | ICD-10-CM | POA: Diagnosis not present

## 2016-12-09 DIAGNOSIS — J3089 Other allergic rhinitis: Secondary | ICD-10-CM | POA: Diagnosis not present

## 2016-12-09 DIAGNOSIS — K219 Gastro-esophageal reflux disease without esophagitis: Secondary | ICD-10-CM | POA: Diagnosis not present

## 2016-12-09 DIAGNOSIS — J3081 Allergic rhinitis due to animal (cat) (dog) hair and dander: Secondary | ICD-10-CM | POA: Diagnosis not present

## 2016-12-09 DIAGNOSIS — Z9989 Dependence on other enabling machines and devices: Secondary | ICD-10-CM | POA: Diagnosis not present

## 2016-12-09 DIAGNOSIS — G4733 Obstructive sleep apnea (adult) (pediatric): Secondary | ICD-10-CM | POA: Diagnosis not present

## 2016-12-09 DIAGNOSIS — Z87891 Personal history of nicotine dependence: Secondary | ICD-10-CM | POA: Diagnosis not present

## 2016-12-10 MED FILL — METFORMIN HCL ER 500 MG TAB: 500 | 30 days supply | Qty: 60 | Fill #3

## 2016-12-11 DIAGNOSIS — C210 Malignant neoplasm of anus, unspecified: Secondary | ICD-10-CM | POA: Diagnosis not present

## 2016-12-11 DIAGNOSIS — G4733 Obstructive sleep apnea (adult) (pediatric): Secondary | ICD-10-CM | POA: Diagnosis not present

## 2016-12-11 DIAGNOSIS — E669 Obesity, unspecified: Secondary | ICD-10-CM | POA: Diagnosis not present

## 2016-12-11 DIAGNOSIS — J449 Chronic obstructive pulmonary disease, unspecified: Secondary | ICD-10-CM | POA: Diagnosis not present

## 2016-12-13 DIAGNOSIS — K219 Gastro-esophageal reflux disease without esophagitis: Secondary | ICD-10-CM | POA: Insufficient documentation

## 2016-12-18 MED FILL — FREESTYLE LITE TEST STRIP: 50 days supply | Qty: 100 | Fill #1

## 2016-12-18 MED FILL — FREESTYLE LANCETS: 50 days supply | Qty: 100 | Fill #1

## 2016-12-21 ENCOUNTER — Other Ambulatory Visit: Payer: Self-pay

## 2016-12-21 VITALS — BP 106/78 | HR 74 | Resp 16 | Ht 64.0 in | Wt 267.0 lb

## 2016-12-21 DIAGNOSIS — E119 Type 2 diabetes mellitus without complications: Secondary | ICD-10-CM

## 2016-12-21 NOTE — Patient Instructions (Signed)
1. Plan to eat 30-45(2-3 servings) GM of carbohydrate each meal and 15 GM for snacks. 2. Plan to check blood sugars twice a day fasting and 1  to 2 hours after eating.  Goals of 80-130 fasting  or 180 or less after meals 3. Plan to walk 10 minutes once a week with son at the track 4. Plan to complete EMMI programs by 02/14/17 5. Plan to see Dr. Luan Pulling 02/09/17 6. Plan to return to [facility] on 04/05/17 at [facility]

## 2016-12-21 NOTE — Patient Outreach (Signed)
[facility]) Care Management   12/21/2016  Denise Ray Jul 18, 1970 [id number]  Denise Ray is an 46 y.o. female.   Member seen for follow up office visit for [facility] to Wellness program for self management of Type 2 diabetes  Subjective:  Member states that she is getting stronger but she is still not able to go back to work.  States she has seen pulmonology and that has helped with her breathing and cough.  States she is to see Dr. Luan Pulling on 01/22/17 and she will have her hemoglobin A1C rechecked then.  States that her blood sugars have been doing good with her morning reading ranging 87-122 and after meal readings 160-180 unless she eats something that makes her sugars go up.  States she is discovering which foods make her blood sugars go up more.  States she has not been able to exercise but hopes that she can start walking early in the morning.   Objective:   Review of Systems  Respiratory: Positive for cough and shortness of breath.     Physical Exam Today's Vitals   12/21/16 0857 12/21/16 0901  BP: 106/78   Pulse: 74   Resp: 16   SpO2: 95%   Weight: 267 lb (121.1 kg)   Height: 1.626 m (_0 )   PainSc: 0-No pain 0-No pain   Encounter Medications:   Outpatient Encounter Prescriptions as of 12/21/2016  Medication Sig  . albuterol (PROVENTIL HFA;VENTOLIN HFA) 108 (90 BASE) MCG/ACT inhaler Inhale 2 puffs into the lungs every 6 (six) hours as needed. wheezing  . albuterol (PROVENTIL) (5 MG/ML) 0.5% nebulizer solution Take 2.5 mg by nebulization every 6 (six) hours as needed for wheezing or shortness of breath.  . ALPRAZolam (XANAX) 1 MG tablet Take 1 mg by mouth 2 (two) times daily.  . budesonide-formoterol (SYMBICORT) 160-4.5 MCG/ACT inhaler Inhale 2 puffs into the lungs 2 (two) times daily.  . famotidine (PEPCID) 20 MG tablet Take 20 mg by mouth at bedtime.  . furosemide (LASIX) 40 MG tablet Take 40 mg by mouth daily as needed.  [name] guaiFENesin (MUCINEX) 600 MG 12  hr tablet Take 1 tablet (600 mg total) by mouth 2 (two) times daily.  [name] HYDROcodone-acetaminophen (NORCO) 10-325 MG tablet Take 1 tablet by mouth every 6 (six) hours.  [name] loratadine (CLARITIN) 10 MG tablet Take 10 mg by mouth daily.  . metFORMIN (GLUCOPHAGE XR) 500 MG 24 hr tablet Take 1 tablet (500 mg total) by mouth 2 (two) times daily.  . pantoprazole (PROTONIX) 40 MG tablet Take 40 mg by mouth 2 (two) times daily. 1/2 tablet twice a day  . polyethylene glycol (MIRALAX / GLYCOLAX) packet Take 17 g by mouth daily as needed for mild constipation.  [name] tiotropium (SPIRIVA HANDIHALER) 18 MCG inhalation capsule Place 1 capsule (18 mcg total) into inhaler and inhale every morning.  . benzonatate (TESSALON) 200 MG capsule Take 1 capsule (200 mg total) by mouth every 8 (eight) hours as needed for cough. (Patient not taking: Reported on 11/25/2016)  . imiquimod (ALDARA) 5 % cream Apply 1 application topically 3 (three) times a week. **ON HOLD**   No facility-administered encounter medications on file as of 12/21/2016.     Functional Status:   In your present state of health, do you have any difficulty performing the following activities: 12/21/2016 10/02/2016  Hearing? N N  Vision? N N  Difficulty concentrating or making decisions? N N  Walking or climbing stairs? Tempie Donning  Dressing or bathing? N Y  Doing errands, shopping? N Y  Comment - shopping  Some recent data might be hidden    Fall/Depression Screening:    Fall Risk  12/21/2016 10/02/2016 06/09/2013  Falls in the past year? No No No   PHQ 2/9 Scores 12/21/2016 10/02/2016  PHQ - 2 Score 0 5  PHQ- 9 Score - 11    Assessment:  Member seen for follow up office visit for [facility] to Wellness program for self management of Type 2 diabetes.  Member is recovering from hospitalization for exacerbation of COPD/asthma.  Member is not meeting diabetes self management goal of hemoglobin A1C of 6.5 % or less with last reading of 6.6%.  She had a new dx of Type 2 DM but  had been prediabetic.  Reports CBGs range 87-122 fasting and 160-180 post prandial.  Member currently has not been able to exercise due to SOB/cough.  Member reports improved mood.  Member to see primary care provider on 01/22/17 and is also followed by pulmonology.  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 Plan to check blood sugars twice a day fasting and 1  to 2 hours after eating.  Goals of 80-130 fasting  or 180 or less after meals Plan to walk 10 minutes once a week with son at the track Plan to complete EMMI programs by 02/14/17 Plan to see Dr. Luan Pulling 02/09/17 Plan to return to [facility] on 04/05/17 at [place] [facility]     Most Recent Value  Care Plan Problem One  Potential for elevated blood sugars related to dx of Type 2 DM  Role Documenting the Problem One  Care Management [place] for Problem One  Active  [facility] Long Term Goal   Member will maintain hemoglobin A1C below 7% for the next 90 days  [facility] Long Term Goal Start Date  12/21/16  Inter[facility] for Problem One Long Term Goal  Reinforced on CHO counting and portion control, Reinforced to be as active as her breathing will allow, Reviewed blood sugar goals and instructed to observe which foods increase her blood sugars more, Instructed on foot care for diabetes and to check her feet daily  [facility] CM Short Term Goal #1   Member will not be readmitted to hospital for the next 30 days  [facility] CM Short Term Goal #1 Start Date  09/11/16  [facility] CM Short Term Goal #1 Met Date  10/14/16    Peter Garter RN, [facility] Care Management Coordinator-Link to [place] Management [phone]

## 2016-12-25 MED FILL — HYDROCODON-APAP 10-325: 10-325 | 30 days supply | Qty: 120 | Fill #0

## 2017-01-06 MED FILL — METFORMIN HCL ER 500 MG TAB: 500 | 30 days supply | Qty: 60 | Fill #4

## 2017-01-22 DIAGNOSIS — J449 Chronic obstructive pulmonary disease, unspecified: Secondary | ICD-10-CM | POA: Diagnosis not present

## 2017-01-22 DIAGNOSIS — C210 Malignant neoplasm of anus, unspecified: Secondary | ICD-10-CM | POA: Diagnosis not present

## 2017-01-22 DIAGNOSIS — F419 Anxiety disorder, unspecified: Secondary | ICD-10-CM | POA: Diagnosis not present

## 2017-01-22 DIAGNOSIS — G4733 Obstructive sleep apnea (adult) (pediatric): Secondary | ICD-10-CM | POA: Diagnosis not present

## 2017-01-22 MED FILL — AZITHROMYCIN 250 MG TAB: 250 | 5 days supply | Qty: 6 | Fill #0

## 2017-01-22 MED FILL — LIDOCAINE HCL 2% JELLY: 2 | 30 days supply | Qty: 120 | Fill #0

## 2017-01-22 MED FILL — HYDROCODON-APAP 10-325: 10-325 | 30 days supply | Qty: 120 | Fill #0

## 2017-01-22 MED FILL — VENLAFAXINE HCL ER 75 MG CA: 75 | 90 days supply | Qty: 180 | Fill #0

## 2017-01-25 MED FILL — FUROSEMIDE 40 MG TAB: 40 | 90 days supply | Qty: 90 | Fill #1

## 2017-01-26 MED FILL — ONDANSETRON HCL 4 MG TABLET: 4 | 7 days supply | Qty: 30 | Fill #0

## 2017-02-15 DIAGNOSIS — D013 Carcinoma in situ of anus and anal canal: Secondary | ICD-10-CM | POA: Diagnosis not present

## 2017-02-17 MED FILL — FREESTYLE LANCETS: 50 days supply | Qty: 100 | Fill #2

## 2017-02-17 MED FILL — PANTOPRAZOLE SOD DR 40 MG T: 40 | 90 days supply | Qty: 90 | Fill #3

## 2017-02-17 MED FILL — FREESTYLE LITE TEST STRIP: 50 days supply | Qty: 100 | Fill #2

## 2017-02-17 MED FILL — METFORMIN HCL ER 500 MG TAB: 500 | 30 days supply | Qty: 60 | Fill #5

## 2017-02-17 MED FILL — AZITHROMYCIN 250 MG TAB: 250 | 5 days supply | Qty: 6 | Fill #1

## 2017-02-19 MED FILL — HYDROCODON-APAP 10-325: 10-325 | 30 days supply | Qty: 120 | Fill #0

## 2017-03-01 MED FILL — ALPRAZolam 1 MG TABS: 1 | 90 days supply | Qty: 180 | Fill #1

## 2017-03-02 ENCOUNTER — Ambulatory Visit (INDEPENDENT_AMBULATORY_CARE_PROVIDER_SITE_OTHER): Payer: 59 | Admitting: Pulmonary Disease

## 2017-03-02 ENCOUNTER — Encounter: Payer: Self-pay | Admitting: Pulmonary Disease

## 2017-03-02 ENCOUNTER — Other Ambulatory Visit: Payer: 59

## 2017-03-02 VITALS — BP 132/80 | HR 101 | Ht 64.0 in | Wt 260.2 lb

## 2017-03-02 DIAGNOSIS — J4550 Severe persistent asthma, uncomplicated: Secondary | ICD-10-CM

## 2017-03-02 DIAGNOSIS — Z23 Encounter for immunization: Secondary | ICD-10-CM

## 2017-03-02 DIAGNOSIS — J189 Pneumonia, unspecified organism: Secondary | ICD-10-CM

## 2017-03-02 DIAGNOSIS — G4733 Obstructive sleep apnea (adult) (pediatric): Secondary | ICD-10-CM | POA: Diagnosis not present

## 2017-03-02 DIAGNOSIS — C20 Malignant neoplasm of rectum: Secondary | ICD-10-CM | POA: Diagnosis not present

## 2017-03-02 DIAGNOSIS — M549 Dorsalgia, unspecified: Secondary | ICD-10-CM | POA: Diagnosis not present

## 2017-03-02 DIAGNOSIS — J449 Chronic obstructive pulmonary disease, unspecified: Secondary | ICD-10-CM | POA: Diagnosis not present

## 2017-03-02 NOTE — Progress Notes (Signed)
Subjective:    Patient ID: Denise Ray, female    DOB: 20-Dec-1970, 46 y.o.   MRN: [id number]  Synopsis: heavy tobacco use history and history of hospitalizations for respiratory failure.  Spirometry testing in 2018 did not show evidence of airflow obstruction.  CT chest imaging has not shown emphysema   HPI Chief Complaint  Patient presents with  . Follow-up    pt states she is much improved, does note intermittent nonprod cough with exertion.     She says taht she is really doing better.  She wonders if the Bitrex used for fit testing caused it. She has not had a flare up of her lung problems since she saw [place]. She reports compliance with symbicort and spiriva, no problems from them. In general she is doing well and is ready to go back to work.   Past Medical History:  Diagnosis Date  . Anal lesion   . Anxiety   . Arthritis    Right hip  . Borderline diabetes mellitus   . Chronic pain   . COPD with asthma ([place])    hx exacerbation's    . Diabetes mellitus without complication ([place])   . GERD (gastroesophageal reflux disease)   . Hemorrhoids   . History of acute respiratory failure    06/ 2015  and 12/ 2016  CAP and Asthma exacerbation--  vented both times (ARDS)  . History of anal dysplasia    AIN 2/ 3    . History of cervical dysplasia    CIN 2 in 2000  . History of hypothyroidism   . History of pneumonia    hx Required ventilatory support  . History of TIA (transient ischemic attack)    Caused by medication reaction  . Migraines   . OSA on CPAP       Review of Systems  Constitutional: Negative for chills, fatigue and fever.  HENT: Negative for postnasal drip, rhinorrhea and sinus pain.   Respiratory: Positive for shortness of breath. Negative for cough and wheezing.   Cardiovascular: Negative for chest pain, palpitations and leg swelling.       Objective:   Physical Exam Vitals:   03/02/17 1521  BP: 132/80  Pulse: (!) 101  SpO2: 97%  Weight: 260 lb  3.2 oz (118 kg)  Height: 5\' 4"  (1.626 m)   Gen: well appearing HENT: OP clear, TM's clear, neck supple PULM: CTA B, normal percussion CV: RRR, no mgr, trace edema GI: BS+, soft, nontender Derm: no cyanosis or rash Psyche: normal mood and affect   PFT 11/2016 Spirometry: no airflow obstruction  Chest imaging: 08/2016 CT angiogram chest: air trapping and subsegmental atelectasis, no PE, no emphysema, personally reviewed by me  Records from her last visit here reviewed were she had spirometry, Zyrtec and Pepcid were added.     Assessment & Plan:    Recurrent pneumonia  Severe persistent asthma without complication  Discussion: He has been doing well since the last visit. She's not had an exacerbation of her lung disease. Looking at spirometry testing was first performed here the last time it's not acceptable by ATS criteria but it's also not consistent with airflow obstruction. So at this point is difficult for me to say that she has COPD. However, her CT scans of the chest which of been performed when she is sick show significant air trapping which is consistent with severe persistent asthma. Given her tobacco use I think that she is at increased risk for  developing COPD in May and fact develop but at some point, so for now will continue to treat her with Symbicort and Spiriva for severe persistent asthma, possible COPD asthma overlap syndrome.   Because she has experienced recurrent severe infections over the years I like to check some basic blood work today to assess her immune system.I'm happy that her most recent CBCs have shown normal white blood cell counts and differentials  Plan: For the episodes of severe recurrent infection/ARDS: I will check an immunoglobulin profile today and complement levels to see if you are more susceptible to infection  For severe asthma: Continue symbicort and spiriva Flu shot today Call us if you feel sick  We will see you back in 4-6 months  or sooner if needed    Current Outpatient Prescriptions:  .  albuterol (PROVENTIL HFA;VENTOLIN HFA) 108 (90 BASE) MCG/ACT inhaler, Inhale 2 puffs into the lungs every 6 (six) hours as needed. wheezing, Disp: 1 Inhaler, Rfl: 11 .  albuterol (PROVENTIL) (5 MG/ML) 0.5% nebulizer solution, Take 2.5 mg by nebulization every 6 (six) hours as needed for wheezing or shortness of breath., Disp: , Rfl:  .  ALPRAZolam (XANAX) 1 MG tablet, Take 1 mg by mouth 2 (two) times daily., Disp: , Rfl:  .  budesonide-formoterol (SYMBICORT) 160-4.5 MCG/ACT inhaler, Inhale 2 puffs into the lungs 2 (two) times daily., Disp: , Rfl:  .  famotidine (PEPCID) 20 MG tablet, Take 20 mg by mouth at bedtime., Disp: , Rfl:  .  furosemide (LASIX) 40 MG tablet, Take 40 mg by mouth daily as needed., Disp: , Rfl:  .  guaiFENesin (MUCINEX) 600 MG 12 hr tablet, Take 1 tablet (600 mg total) by mouth 2 (two) times daily. (Patient taking differently: Take 600 mg by mouth 2 (two) times daily as needed. ), Disp: 30 tablet, Rfl: 1 .  HYDROcodone-acetaminophen (NORCO) 10-325 MG tablet, Take 1 tablet by mouth every 6 (six) hours., Disp: , Rfl: 0 .  loratadine (CLARITIN) 10 MG tablet, Take 10 mg by mouth daily., Disp: , Rfl:  .  metFORMIN (GLUCOPHAGE XR) 500 MG 24 hr tablet, Take 1 tablet (500 mg total) by mouth 2 (two) times daily., Disp: 60 tablet, Rfl: 12 .  pantoprazole (PROTONIX) 40 MG tablet, Take 40 mg by mouth 2 (two) times daily. 1/2 tablet twice a day, Disp: , Rfl:  .  polyethylene glycol (MIRALAX / GLYCOLAX) packet, Take 17 g by mouth daily as needed for mild constipation., Disp: , Rfl:  .  tiotropium (SPIRIVA HANDIHALER) 18 MCG inhalation capsule, Place 1 capsule (18 mcg total) into inhaler and inhale every morning., Disp: 30 capsule, Rfl: 12

## 2017-03-02 NOTE — Patient Instructions (Signed)
For the episodes of severe recurrent infection/ARDS: I will check an immunoglobulin profile today and complement levels to see if you are more susceptible to infection  For severe asthma: Continue symbicort and spiriva Flu shot today Call us if you feel sick  We will see you back in 4-6 months or sooner if needed

## 2017-03-02 NOTE — Addendum Note (Signed)
Addended by: Len Blalock on: 03/02/2017 03:49 PM   Modules accepted: Orders

## 2017-03-04 LAB — COMPLEMENT, TOTAL

## 2017-03-04 LAB — IGG, IGA, IGM
IMMUNOGLOBULIN A: 237 mg/dL (ref 81–463)
IgG (Immunoglobin G), Serum: 1233 mg/dL (ref 694–1618)
IgM, Serum: 175 mg/dL (ref 48–271)

## 2017-03-23 ENCOUNTER — Inpatient Hospital Stay (HOSPITAL_COMMUNITY)
Admission: EM | Admit: 2017-03-23 | Discharge: 2017-03-26 | DRG: 579 | Disposition: A | Discharge status: Home / Self Care | Payer: 59 | Attending: Internal Medicine | Admitting: Internal Medicine

## 2017-03-23 ENCOUNTER — Emergency Department (HOSPITAL_COMMUNITY): Payer: 59

## 2017-03-23 ENCOUNTER — Encounter (HOSPITAL_COMMUNITY): Payer: Self-pay

## 2017-03-23 DIAGNOSIS — G893 Neoplasm related pain (acute) (chronic): Secondary | ICD-10-CM | POA: Diagnosis present

## 2017-03-23 DIAGNOSIS — F17200 Nicotine dependence, unspecified, uncomplicated: Secondary | ICD-10-CM | POA: Diagnosis not present

## 2017-03-23 DIAGNOSIS — G8929 Other chronic pain: Secondary | ICD-10-CM | POA: Diagnosis present

## 2017-03-23 DIAGNOSIS — K219 Gastro-esophageal reflux disease without esophagitis: Secondary | ICD-10-CM | POA: Diagnosis not present

## 2017-03-23 DIAGNOSIS — R0603 Acute respiratory distress: Secondary | ICD-10-CM | POA: Diagnosis not present

## 2017-03-23 DIAGNOSIS — J441 Chronic obstructive pulmonary disease with (acute) exacerbation: Secondary | ICD-10-CM | POA: Diagnosis not present

## 2017-03-23 DIAGNOSIS — Z79899 Other long term (current) drug therapy: Secondary | ICD-10-CM

## 2017-03-23 DIAGNOSIS — Z7984 Long term (current) use of oral hypoglycemic drugs: Secondary | ICD-10-CM

## 2017-03-23 DIAGNOSIS — J449 Chronic obstructive pulmonary disease, unspecified: Secondary | ICD-10-CM | POA: Diagnosis present

## 2017-03-23 DIAGNOSIS — J45901 Unspecified asthma with (acute) exacerbation: Secondary | ICD-10-CM | POA: Diagnosis present

## 2017-03-23 DIAGNOSIS — E119 Type 2 diabetes mellitus without complications: Secondary | ICD-10-CM | POA: Diagnosis not present

## 2017-03-23 DIAGNOSIS — L02211 Cutaneous abscess of abdominal wall: Principal | ICD-10-CM | POA: Diagnosis present

## 2017-03-23 DIAGNOSIS — F32A Depression, unspecified: Secondary | ICD-10-CM | POA: Diagnosis present

## 2017-03-23 DIAGNOSIS — E039 Hypothyroidism, unspecified: Secondary | ICD-10-CM | POA: Diagnosis present

## 2017-03-23 DIAGNOSIS — Z8541 Personal history of malignant neoplasm of cervix uteri: Secondary | ICD-10-CM

## 2017-03-23 DIAGNOSIS — Z8673 Personal history of transient ischemic attack (TIA), and cerebral infarction without residual deficits: Secondary | ICD-10-CM | POA: Diagnosis not present

## 2017-03-23 DIAGNOSIS — J4489 Other specified chronic obstructive pulmonary disease: Secondary | ICD-10-CM | POA: Diagnosis present

## 2017-03-23 DIAGNOSIS — R000 Tachycardia, unspecified: Secondary | ICD-10-CM | POA: Diagnosis not present

## 2017-03-23 DIAGNOSIS — L03311 Cellulitis of abdominal wall: Secondary | ICD-10-CM | POA: Diagnosis not present

## 2017-03-23 DIAGNOSIS — E669 Obesity, unspecified: Secondary | ICD-10-CM | POA: Diagnosis present

## 2017-03-23 DIAGNOSIS — L0291 Cutaneous abscess, unspecified: Secondary | ICD-10-CM | POA: Diagnosis present

## 2017-03-23 DIAGNOSIS — R0602 Shortness of breath: Secondary | ICD-10-CM | POA: Diagnosis not present

## 2017-03-23 DIAGNOSIS — F419 Anxiety disorder, unspecified: Secondary | ICD-10-CM | POA: Diagnosis present

## 2017-03-23 DIAGNOSIS — J9621 Acute and chronic respiratory failure with hypoxia: Secondary | ICD-10-CM | POA: Diagnosis not present

## 2017-03-23 DIAGNOSIS — Z8709 Personal history of other diseases of the respiratory system: Secondary | ICD-10-CM | POA: Diagnosis present

## 2017-03-23 DIAGNOSIS — A419 Sepsis, unspecified organism: Secondary | ICD-10-CM

## 2017-03-23 DIAGNOSIS — G4733 Obstructive sleep apnea (adult) (pediatric): Secondary | ICD-10-CM | POA: Diagnosis present

## 2017-03-23 DIAGNOSIS — F329 Major depressive disorder, single episode, unspecified: Secondary | ICD-10-CM | POA: Diagnosis not present

## 2017-03-23 DIAGNOSIS — J9601 Acute respiratory failure with hypoxia: Secondary | ICD-10-CM | POA: Diagnosis not present

## 2017-03-23 DIAGNOSIS — K5730 Diverticulosis of large intestine without perforation or abscess without bleeding: Secondary | ICD-10-CM | POA: Diagnosis not present

## 2017-03-23 DIAGNOSIS — F339 Major depressive disorder, recurrent, unspecified: Secondary | ICD-10-CM | POA: Diagnosis present

## 2017-03-23 DIAGNOSIS — L0390 Cellulitis, unspecified: Secondary | ICD-10-CM

## 2017-03-23 DIAGNOSIS — Z87891 Personal history of nicotine dependence: Secondary | ICD-10-CM | POA: Diagnosis present

## 2017-03-23 DIAGNOSIS — Z8701 Personal history of pneumonia (recurrent): Secondary | ICD-10-CM

## 2017-03-23 DIAGNOSIS — Z6841 Body Mass Index (BMI) 40.0 and over, adult: Secondary | ICD-10-CM

## 2017-03-23 LAB — CBC
HEMATOCRIT: 40.8 % (ref 36.0–46.0)
HEMOGLOBIN: 13.8 g/dL (ref 12.0–15.0)
MCH: 29.7 pg (ref 26.0–34.0)
MCHC: 33.8 g/dL (ref 30.0–36.0)
MCV: 87.7 fL (ref 78.0–100.0)
Platelets: 394 10*3/uL (ref 150–400)
RBC: 4.65 MIL/uL (ref 3.87–5.11)
RDW: 13.9 % (ref 11.5–15.5)
WBC: 12.3 10*3/uL — ABNORMAL HIGH (ref 4.0–10.5)

## 2017-03-23 LAB — I-STAT CHEM 8, ED
BUN: 7 mg/dL (ref 6–20)
CREATININE: 0.7 mg/dL (ref 0.44–1.00)
Calcium, Ion: 1.06 mmol/L — ABNORMAL LOW (ref 1.15–1.40)
Chloride: 104 mmol/L (ref 101–111)
GLUCOSE: 152 mg/dL — AB (ref 65–99)
HCT: 38 % (ref 36.0–46.0)
HEMOGLOBIN: 12.9 g/dL (ref 12.0–15.0)
POTASSIUM: 3 mmol/L — AB (ref 3.5–5.1)
Sodium: 141 mmol/L (ref 135–145)
TCO2: 24 mmol/L (ref 22–32)

## 2017-03-23 LAB — PROTIME-INR
INR: 0.89
PROTHROMBIN TIME: 12 s (ref 11.4–15.2)

## 2017-03-23 LAB — URINALYSIS, ROUTINE W REFLEX MICROSCOPIC
BACTERIA UA: NONE SEEN
Bilirubin Urine: NEGATIVE
Glucose, UA: NEGATIVE mg/dL
Ketones, ur: NEGATIVE mg/dL
Leukocytes, UA: NEGATIVE
Nitrite: NEGATIVE
PH: 6 (ref 5.0–8.0)
Protein, ur: NEGATIVE mg/dL

## 2017-03-23 LAB — HEMOGLOBIN A1C
HEMOGLOBIN A1C: 6 % — AB (ref 4.8–5.6)
Mean Plasma Glucose: 125.5 mg/dL

## 2017-03-23 LAB — COMPREHENSIVE METABOLIC PANEL
ALBUMIN: 3.7 g/dL (ref 3.5–5.0)
ALK PHOS: 72 U/L (ref 38–126)
ALT: 17 U/L (ref 14–54)
ANION GAP: 9 (ref 5–15)
AST: 22 U/L (ref 15–41)
BILIRUBIN TOTAL: 0.4 mg/dL (ref 0.3–1.2)
BUN: 7 mg/dL (ref 6–20)
CALCIUM: 8.8 mg/dL — AB (ref 8.9–10.3)
CO2: 26 mmol/L (ref 22–32)
Chloride: 102 mmol/L (ref 101–111)
Creatinine, Ser: 0.8 mg/dL (ref 0.44–1.00)
GFR calc Af Amer: >60 mL/min (ref >60)
GFR calc non Af Amer: >60 mL/min (ref >60)
GLUCOSE: 117 mg/dL — AB (ref 65–99)
Potassium: 3.5 mmol/L (ref 3.5–5.1)
SODIUM: 137 mmol/L (ref 135–145)
TOTAL PROTEIN: 7 g/dL (ref 6.5–8.1)

## 2017-03-23 LAB — I-STAT CG4 LACTIC ACID, ED
LACTIC ACID, VENOUS: 3.45 mmol/L — AB (ref 0.5–1.9)
Lactic Acid, Venous: 4.4 mmol/L (ref 0.5–1.9)

## 2017-03-23 LAB — LIPASE, BLOOD: Lipase: 27 U/L (ref 11–51)

## 2017-03-23 IMAGING — CT CT ABD-PELV W/ CM
2 of 5 series · 16 of 46 positions shown, 18 images · IV contrast (ISOVUE)
Comparison: [DATE] CT abdomen and pelvis.

CLINICAL DATA: 46 y/o F; history of anal cancer. Draining
protrusion of right lower abdominal fall.

EXAM:
CT ABDOMEN AND PELVIS WITH CONTRAST
TECHNIQUE: Multidetector CT imaging of the abdomen and pelvis was performed
using the standard protocol following bolus administration of
intravenous contrast.
CONTRAST:  100mL [Z3] IOPAMIDOL ([Z3]) INJECTION 61%

[Series 2: abd/pel with · axial · 0.98mm/px · z∈[-545,-65]mm · 13 of 109 slices shown, 15 images]
[im 7/109  soft-tissue]
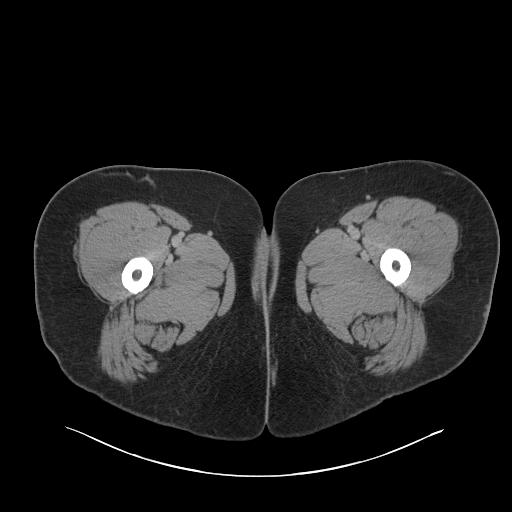
[im 7/109  bone]
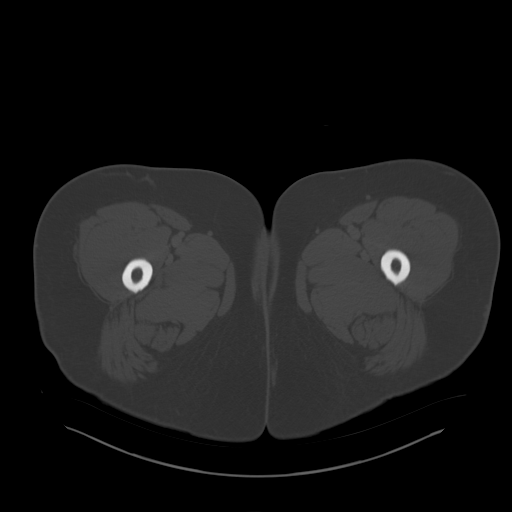
[im 13/109  soft-tissue]
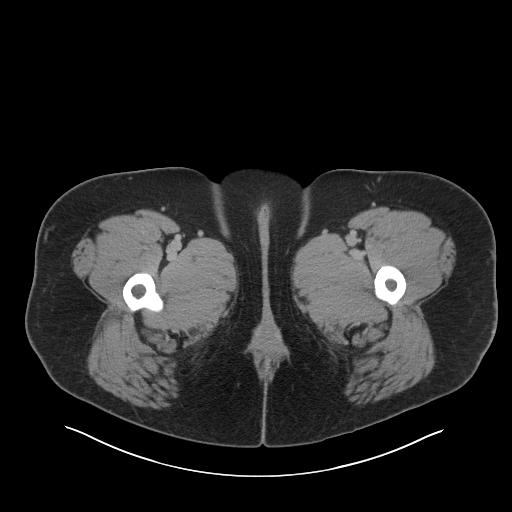
[im 25/109  soft-tissue]
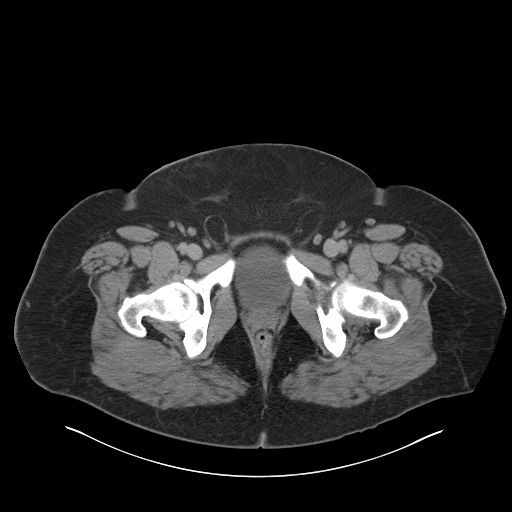
[im 31/109  soft-tissue]
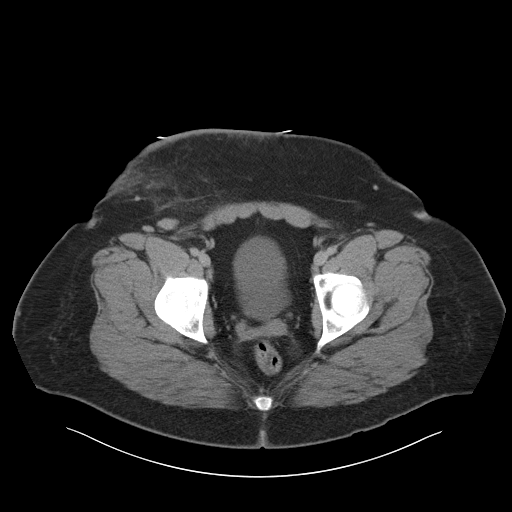
[im 37/109  soft-tissue]
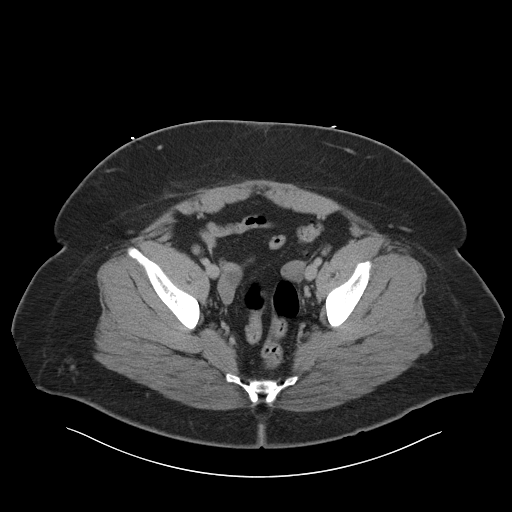
[im 49/109  soft-tissue]
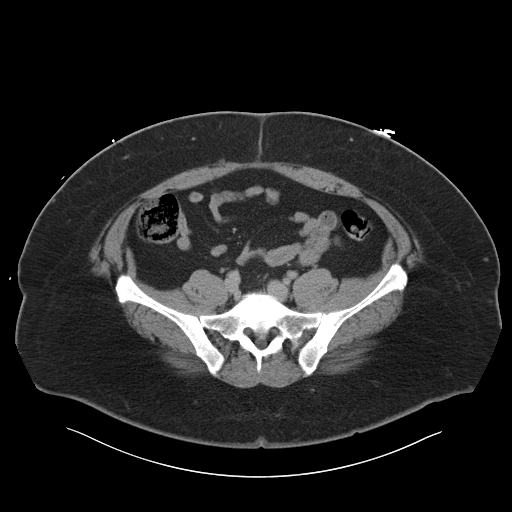
[im 55/109  soft-tissue]
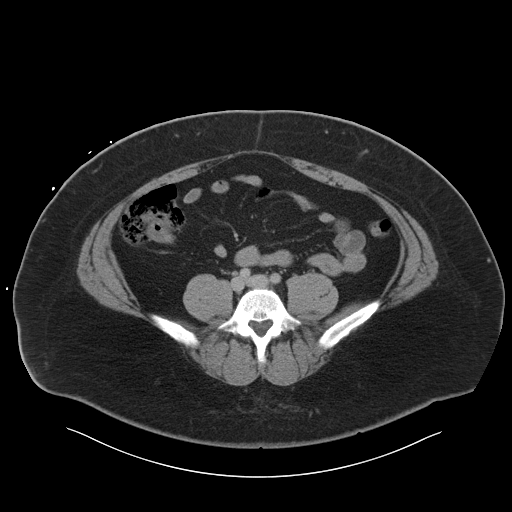
[im 61/109  soft-tissue]
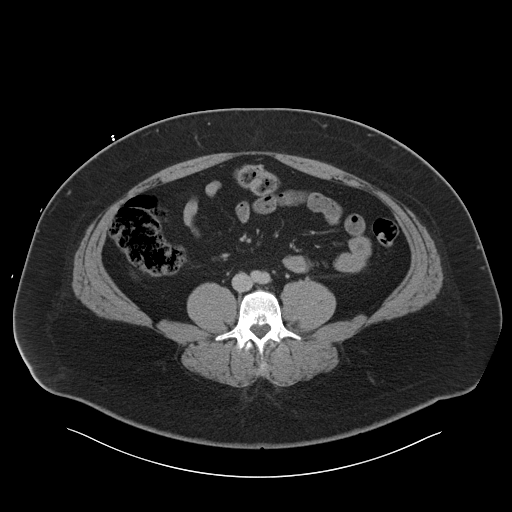
[im 73/109  soft-tissue]
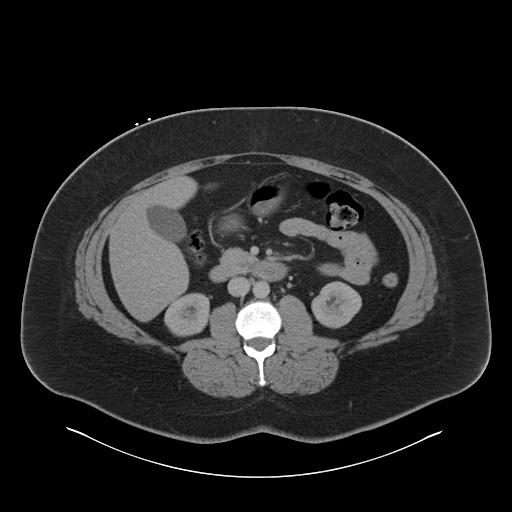
[im 73/109  bone]
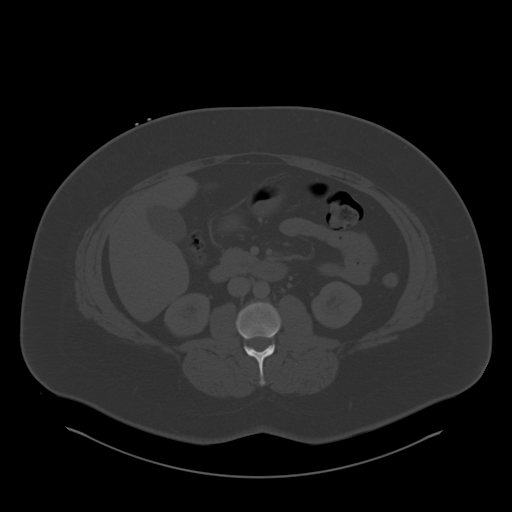
[im 79/109  soft-tissue]
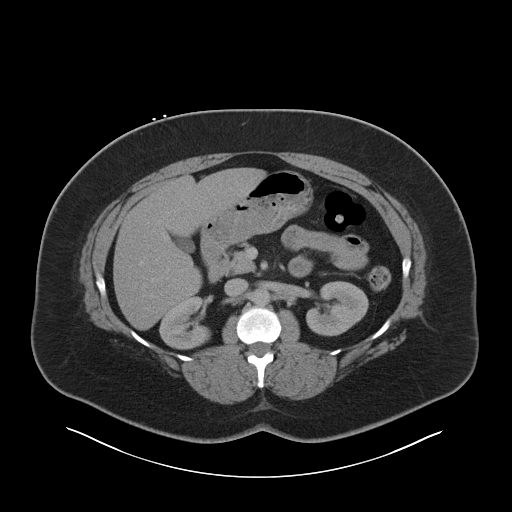
[im 85/109  soft-tissue]
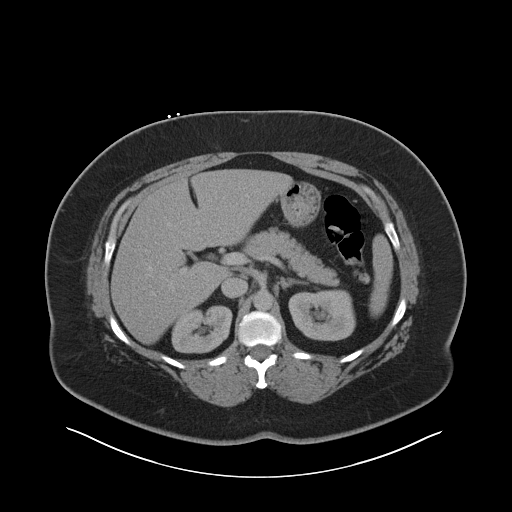
[im 97/109  soft-tissue]
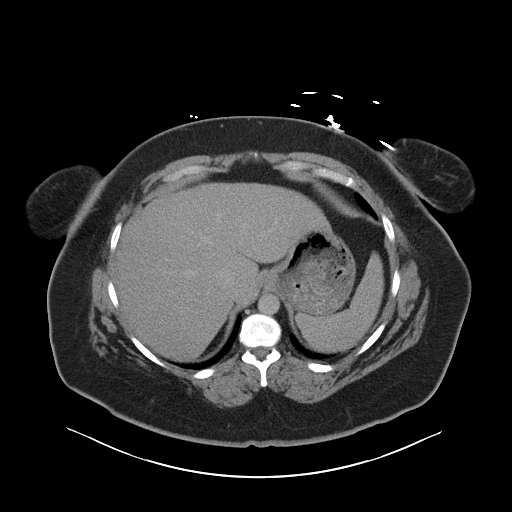
[im 103/109  soft-tissue]
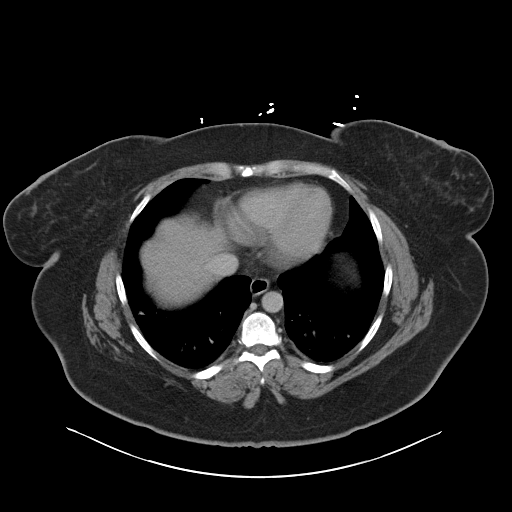

[Series 4: coronal a/|p · coronal · 0.90mm/px · 3 of 163 slices shown]
[im 55/163  soft-tissue]
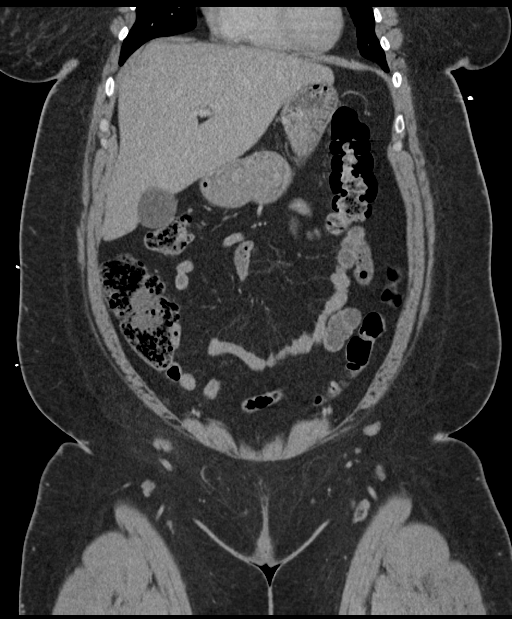
[im 73/163  soft-tissue]
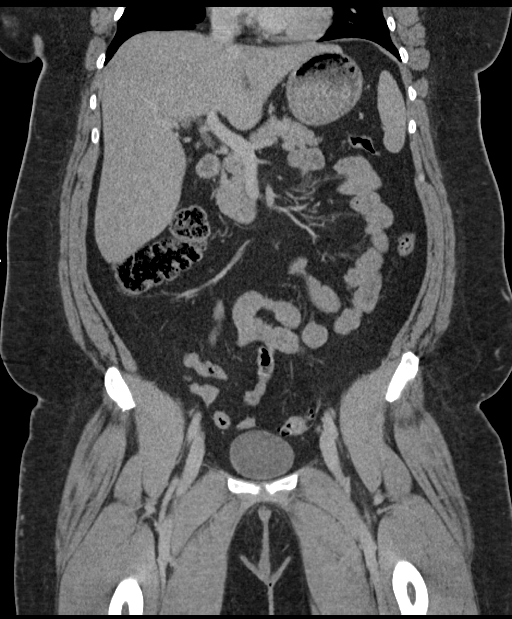
[im 91/163  soft-tissue]
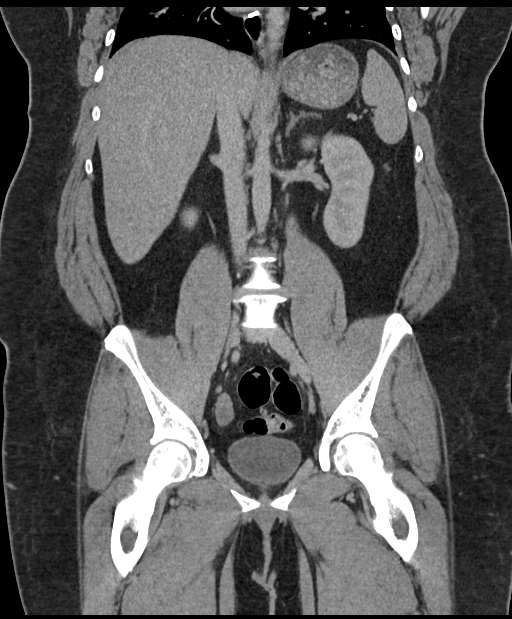

[16 of 46 positions shown; findings below may reference images not displayed]

FINDINGS: Lower chest: Right lower lobe platelike atelectasis.

Hepatobiliary: Hepatic steatosis. No focal liver abnormality is
seen. No gallstones, gallbladder wall thickening, or biliary
dilatation.

Pancreas: Unremarkable. No pancreatic ductal dilatation or
surrounding inflammatory changes.

Spleen: Normal in size without focal abnormality.

Adrenals/Urinary Tract: Adrenal glands are unremarkable. Kidneys are
normal, without renal calculi, focal lesion, or hydronephrosis.
Bladder is unremarkable.

Stomach/Bowel: Stomach is within normal limits. Appendix appears
normal. No evidence of bowel wall thickening, distention, or
inflammatory changes. Mild sigmoid diverticulosis.

Vascular/Lymphatic: No significant vascular findings are present.
Stable prominent right external iliac lymph nodes, less than 1 cm
short axis.

Reproductive: Simple right adnexal cyst measuring 23 mm.
Hysterectomy.

Other: Right lower abdominal wall 19 mm dermal lesion probably
representing a cyst or abscess (series 2, image 82). Mild
surrounding inflammatory changes in subcutaneous fat.

Musculoskeletal: No acute or significant osseous findings.
IMPRESSION: 1. Right lower abdominal wall 19 mm dermal lesion, likely
representing a sister abscess with mild surrounding inflammatory
changes.
2. Hepatic steatosis.
3. Mild sigmoid diverticulosis.

By: GRANATO M.D.

## 2017-03-23 IMAGING — DX DG CHEST 1V PORT
1 series · 1 of 1 positions shown · non-contrast
Comparison: None.

CLINICAL DATA: 46 y/o  F; shortness of breath.

EXAM:
PORTABLE CHEST 1 VIEW

[chest ap]
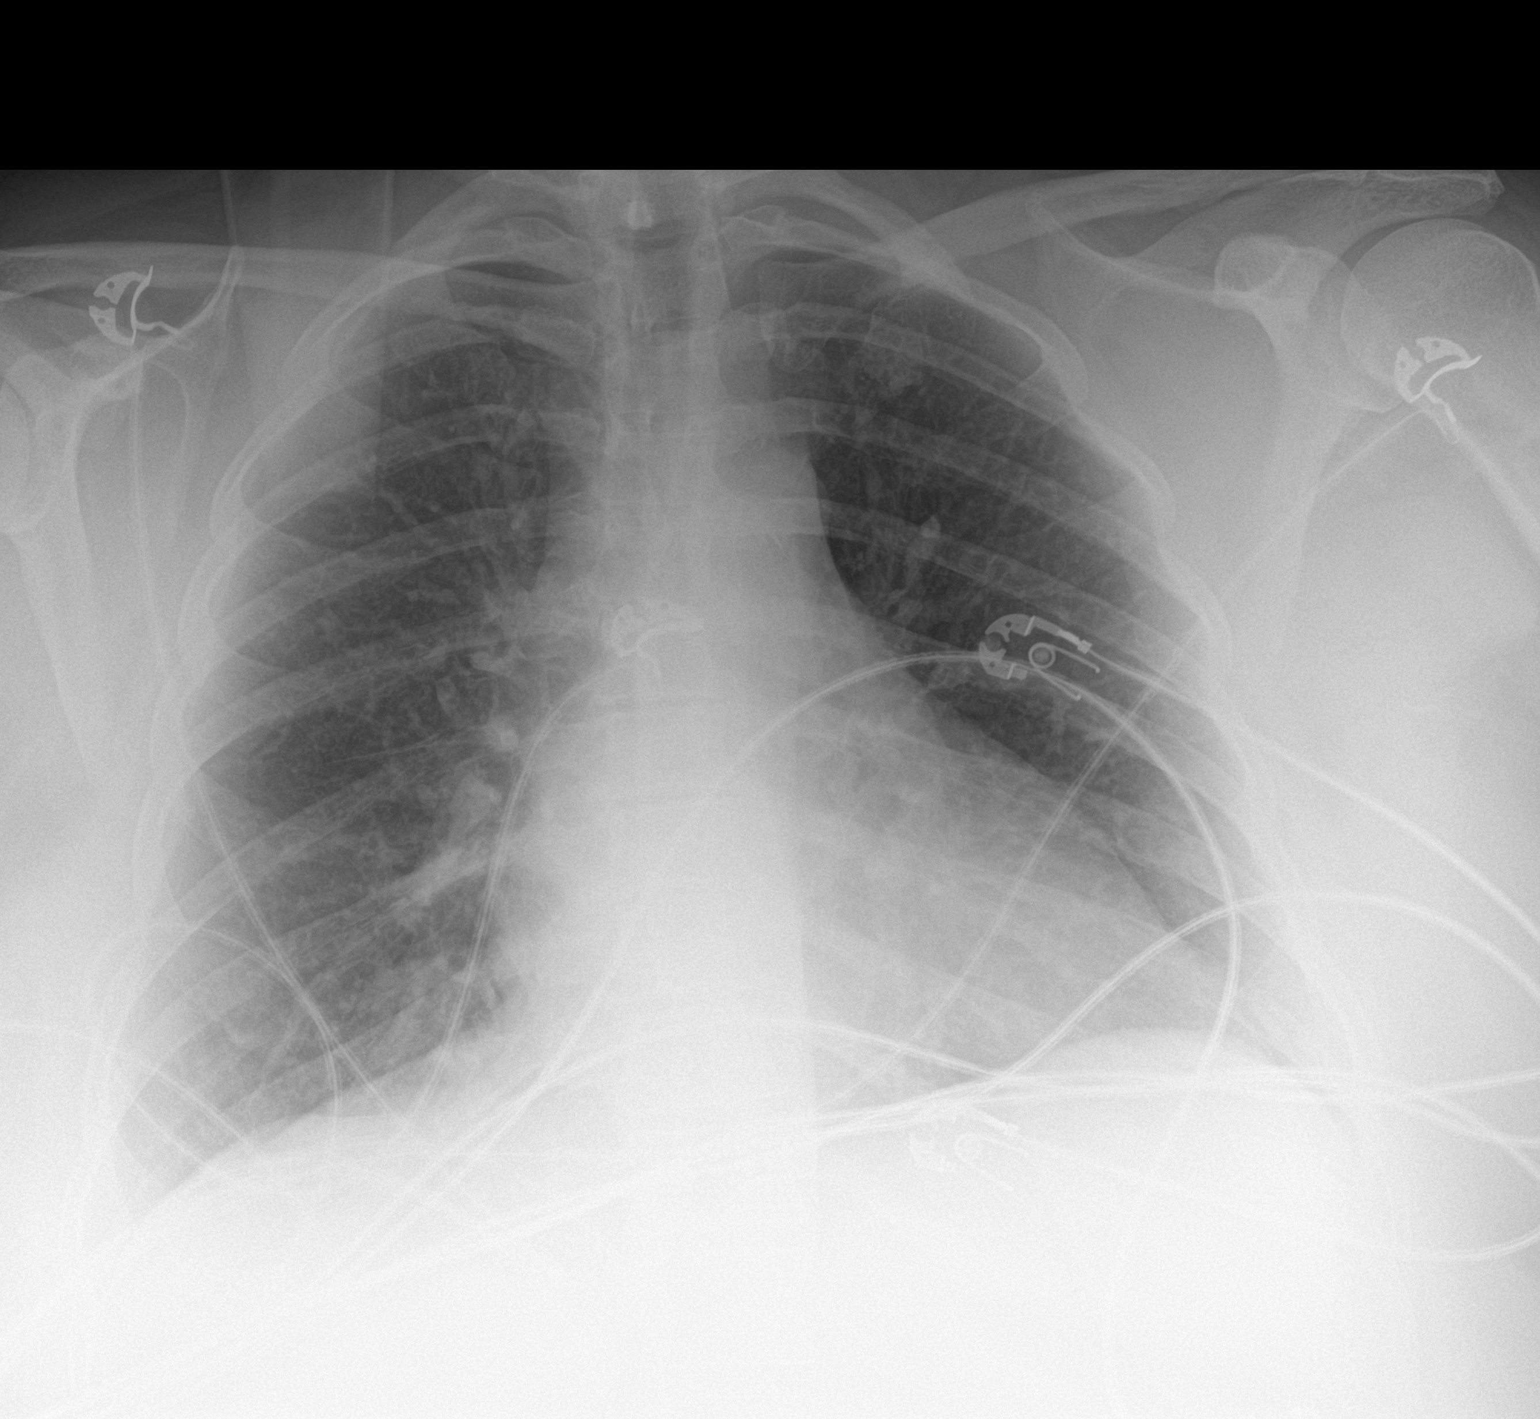

[1 of 1 positions shown; findings below may reference images not displayed]

FINDINGS: Stable heart size and mediastinal contours are within normal limits.
Pulmonary venous hypertension. No consolidation, effusion, or
pneumothorax. The visualized skeletal structures are unremarkable.
IMPRESSION: Stable cardiac silhouette. Pulmonary venous hypertension. No
consolidation.

By: KHRUZTY M.D.

## 2017-03-23 MED ORDER — IPRATROPIUM BROMIDE 0.02 % IN SOLN
RESPIRATORY_TRACT | Status: AC
  Administered 2017-03-23: 1 mg
  Filled 2017-03-23: qty 5

## 2017-03-23 MED ORDER — VANCOMYCIN HCL IN DEXTROSE 1-5 GM/200ML-% IV SOLN
1000.0000 mg | Freq: Once | INTRAVENOUS | Status: AC
  Administered 2017-03-23: 1000 mg via INTRAVENOUS
  Filled 2017-03-23: qty 200

## 2017-03-23 MED ORDER — IOPAMIDOL (ISOVUE-300) INJECTION 61%
INTRAVENOUS | Status: AC
  Filled 2017-03-23: qty 100

## 2017-03-23 MED ORDER — DIPHENHYDRAMINE HCL 50 MG/ML IJ SOLN
25.0000 mg | Freq: Once | INTRAMUSCULAR | Status: AC
  Administered 2017-03-23: 25 mg via INTRAVENOUS
  Filled 2017-03-23: qty 1

## 2017-03-23 MED ORDER — POTASSIUM CHLORIDE CRYS ER 20 MEQ PO TBCR: 40.0000 meq | EXTENDED_RELEASE_TABLET | Freq: Once | ORAL | Status: DC

## 2017-03-23 MED ORDER — VANCOMYCIN HCL 10 G IV SOLR
2000.0000 mg | Freq: Once | INTRAVENOUS | Status: AC
  Administered 2017-03-24: 2000 mg via INTRAVENOUS
  Filled 2017-03-23: qty 2000

## 2017-03-23 MED ORDER — METHYLPREDNISOLONE SODIUM SUCC 125 MG IJ SOLR
125.0000 mg | Freq: Once | INTRAMUSCULAR | Status: AC
  Administered 2017-03-23: 125 mg via INTRAVENOUS
  Filled 2017-03-23: qty 2

## 2017-03-23 MED ORDER — ALBUTEROL (5 MG/ML) CONTINUOUS INHALATION SOLN
10.0000 mg/h | INHALATION_SOLUTION | Freq: Once | RESPIRATORY_TRACT | Status: AC
  Administered 2017-03-23: 10 mg/h via RESPIRATORY_TRACT
  Filled 2017-03-23: qty 20

## 2017-03-23 MED ORDER — HYDROMORPHONE HCL 1 MG/ML IJ SOLN
1.0000 mg | Freq: Once | INTRAMUSCULAR | Status: AC
  Administered 2017-03-23: 1 mg via INTRAVENOUS
  Filled 2017-03-23: qty 1

## 2017-03-23 MED ORDER — HYDROMORPHONE HCL 1 MG/ML IJ SOLN
INTRAMUSCULAR | Status: AC
  Filled 2017-03-23: qty 1

## 2017-03-23 MED ORDER — SODIUM CHLORIDE 0.9 % IV BOLUS (SEPSIS)
1000.0000 mL | Freq: Once | INTRAVENOUS | Status: AC
  Administered 2017-03-23: 1000 mL via INTRAVENOUS

## 2017-03-23 MED ORDER — PIPERACILLIN-TAZOBACTAM 3.375 G IVPB 30 MIN
3.3750 g | Freq: Once | INTRAVENOUS | Status: AC
  Administered 2017-03-23: 3.375 g via INTRAVENOUS
  Filled 2017-03-23: qty 50

## 2017-03-23 MED ORDER — IPRATROPIUM-ALBUTEROL 0.5-2.5 (3) MG/3ML IN SOLN
3.0000 mL | Freq: Once | RESPIRATORY_TRACT | Status: AC
  Administered 2017-03-23: 3 mL via RESPIRATORY_TRACT
  Filled 2017-03-23: qty 3

## 2017-03-23 MED ORDER — ONDANSETRON HCL 4 MG/2ML IJ SOLN
4.0000 mg | Freq: Once | INTRAMUSCULAR | Status: AC
  Administered 2017-03-23: 4 mg via INTRAVENOUS
  Filled 2017-03-23: qty 2

## 2017-03-23 MED ORDER — IOPAMIDOL (ISOVUE-300) INJECTION 61%
100.0000 mL | Freq: Once | INTRAVENOUS | Status: AC | PRN
  Administered 2017-03-23: 100 mL via INTRAVENOUS

## 2017-03-23 MED ORDER — MAGNESIUM SULFATE 2 GM/50ML IV SOLN
2.0000 g | Freq: Once | INTRAVENOUS | Status: AC
  Administered 2017-03-23: 2 g via INTRAVENOUS
  Filled 2017-03-23: qty 50

## 2017-03-23 MED ORDER — TETANUS-DIPHTH-ACELL PERTUSSIS 5-2.5-18.5 LF-MCG/0.5 IM SUSP: 0.5000 mL | Freq: Once | INTRAMUSCULAR | Status: DC

## 2017-03-23 MED ORDER — SODIUM CHLORIDE 0.9 % IV BOLUS (SEPSIS)
1000.0000 mL | Freq: Once | INTRAVENOUS | Status: AC
  Administered 2017-03-24: 1000 mL via INTRAVENOUS

## 2017-03-23 NOTE — ED Notes (Signed)
Went into pt room and found IV tubing disconnected. Vancomycin infusing onto floor. Pharmacy made aware.

## 2017-03-23 NOTE — Progress Notes (Signed)
A consult was received from an ED physician for Zosyn and Vancomycin per pharmacy dosing.  The patient's profile has been reviewed for ht/wt/allergies/indication/available labs.   A one time order has been placed for Zosyn 3.375g IV and Vancomycin 1g.  Further antibiotics/pharmacy consults should be ordered by admitting physician if indicated.                       Thank you, Leeroy Bock 03/23/2017  9:45 PM

## 2017-03-23 NOTE — ED Notes (Signed)
Dr Tegeler notified of pt SOB or for Duoneb obtained resp tech called for tx.

## 2017-03-23 NOTE — ED Notes (Signed)
To CT and returned

## 2017-03-23 NOTE — ED Notes (Signed)
Surgeon and EDP at bedside evaluating patient-patient complaining of severe pain-EDP aware

## 2017-03-23 NOTE — ED Notes (Signed)
Bed: WA06 Expected date:  Expected time:  Means of arrival:  Comments: Triage  

## 2017-03-23 NOTE — ED Notes (Signed)
ED Provider at bedside. 

## 2017-03-23 NOTE — ED Triage Notes (Signed)
Protrusion noted at right lower abdominal fold. Blood drainage noted at site. Patient states that she heard a pop and felt pressure at the site 2 days ago when she was attempting to place a patient in a vest. Hx of anal CA.

## 2017-03-23 NOTE — ED Provider Notes (Signed)
[facility] DEPT Provider Note   CSN: [id number] Arrival date & time: 03/23/17  1813     History   Chief Complaint Chief Complaint  Patient presents with  . GI Problem    HPI Denise Ray is a 46 y.o. female.  The history is provided by the patient. No language interpreter was used.  Abdominal Pain   This is a new problem. The current episode started more than 2 days ago (6 days). The problem occurs constantly. The problem has been rapidly worsening. The pain is associated with an unknown factor. The pain is located in the RLQ. The quality of the pain is aching and sharp. The pain is at a severity of 10/10. The pain is severe. Associated symptoms include nausea and constipation. Pertinent negatives include anorexia, fever, diarrhea, melena, vomiting, dysuria, frequency and headaches. The symptoms are aggravated by palpation and certain positions. Nothing relieves the symptoms.  Shortness of Breath  This is a new problem. The average episode lasts 30 minutes. The problem occurs continuously.The current episode started less than 1 hour ago. The problem has been rapidly worsening. Associated symptoms include wheezing and abdominal pain. Pertinent negatives include no fever, no headaches, no neck pain, no cough, no chest pain, no syncope, no vomiting, no leg pain and no leg swelling. Precipitated by: pain. She has tried nothing for the symptoms. Associated medical issues include asthma and COPD.    Past Medical History:  Diagnosis Date  . Anal lesion   . Anxiety   . Arthritis    Right hip  . Borderline diabetes mellitus   . Chronic pain   . COPD with asthma ([place])    hx exacerbation's    . Diabetes mellitus without complication ([place])   . GERD (gastroesophageal reflux disease)   . Hemorrhoids   . History of acute respiratory failure    06/ 2015  and 12/ 2016  CAP and Asthma exacerbation--  vented both times (ARDS)  . History of anal dysplasia    AIN 2/ 3    . History of cervical dysplasia    CIN 2 in 2000  . History of hypothyroidism   . History of pneumonia    hx Required ventilatory support  . History of TIA (transient ischemic attack)    Caused by medication reaction  . Migraines   . OSA on CPAP     Patient Active Problem List   Diagnosis Date Noted  . Cough 10/30/2016  . Diabetes ([place]) 09/11/2016  . COPD exacerbation ([place]) 08/31/2016  . Bradycardia 05/07/2015  . Intrinsic asthma 11/10/2013  . OSA (obstructive sleep apnea) 11/10/2013  . Rhinovirus infection 11/02/2013  . Acute lung injury 11/02/2013  . Respiratory distress 10/31/2013  . Acute respiratory failure ([place]) 10/31/2013  . Respiratory failure ([place]) 10/31/2013  . Atypical pneumonia 10/26/2013  . CAP (community acquired pneumonia) 10/26/2013  . Asthma with acute exacerbation 10/26/2013  . Unspecified hypothyroidism 10/26/2013  . Hypokalemia 10/26/2013  . Obesity, unspecified 10/26/2013  . PTSD (post-traumatic stress disorder) 01/18/2013  . Depression, major, recurrent ([place]) 01/18/2013    Past Surgical History:  Procedure Laterality Date  . CESAREAN SECTION  2001  . CONIZATION OF CERVIX  2000  . DILATION AND CURETTAGE OF UTERUS    . HYSTEROSCOPY W/D&C  09/12/2007  &  2005  . TRANSTHORACIC ECHOCARDIOGRAM  05/07/2015   lvsf vigorous, ef 70-75%/  trivial MR and TR  . VIDEO ASSISTED THORACOSCOPY (VATS)/THOROCOTOMY Right 11/04/2001   w/ Resection duplication  esophageal cyst    OB History    Gravida Para Term Preterm AB Living   1 1 1     1    SAB TAB Ectopic Multiple Live Births           1       Home Medications    Prior to Admission medications   Medication Sig Start Date End Date Taking? Authorizing Provider  albuterol (PROVENTIL HFA;VENTOLIN HFA) 108 (90 BASE) MCG/ACT inhaler Inhale 2 puffs into the lungs every 6 (six) hours as needed. wheezing 01/06/13   Lysbeth Penner, FNP  albuterol (PROVENTIL) (5 MG/ML) 0.5% nebulizer solution Take 2.5  mg by nebulization every 6 (six) hours as needed for wheezing or shortness of breath.    [provider]  ALPRAZolam Duanne Moron) 1 MG tablet Take 1 mg by mouth 2 (two) times daily. 11/05/13   Whiteheart, Cristal Ford, NP  budesonide-formoterol (SYMBICORT) 160-4.5 MCG/ACT inhaler Inhale 2 puffs into the lungs 2 (two) times daily.    [provider]  famotidine (PEPCID) 20 MG tablet Take 20 mg by mouth at bedtime.    [provider]  furosemide (LASIX) 40 MG tablet Take 40 mg by mouth daily as needed.    [provider]  guaiFENesin (MUCINEX) 600 MG 12 hr tablet Take 1 tablet (600 mg total) by mouth 2 (two) times daily. Patient taking differently: Take 600 mg by mouth 2 (two) times daily as needed.  09/11/16   [name], MD  HYDROcodone-acetaminophen (NORCO) 10-325 MG tablet Take 1 tablet by mouth every 6 (six) hours. 08/04/16   [provider]  loratadine (CLARITIN) 10 MG tablet Take 10 mg by mouth daily.    [provider]  metFORMIN (GLUCOPHAGE XR) 500 MG 24 hr tablet Take 1 tablet (500 mg total) by mouth 2 (two) times daily. 09/11/16   [name], MD  pantoprazole (PROTONIX) 40 MG tablet Take 40 mg by mouth 2 (two) times daily. 1/2 tablet twice a day    [provider]  polyethylene glycol (MIRALAX / GLYCOLAX) packet Take 17 g by mouth daily as needed for mild constipation.    [provider]  tiotropium (SPIRIVA HANDIHALER) 18 MCG inhalation capsule Place 1 capsule (18 mcg total) into inhaler and inhale every morning. 09/11/16   [name], MD    Family History Family History  Problem Relation Age of Onset  . Breast cancer Brother 15  . Breast cancer Maternal Aunt   . Depression Mother   . Anesthesia problems Mother        post-op N/V  . Alcohol abuse Father   . Alcohol abuse Cousin     Social History Social History   Tobacco Use  . Smoking status: Former Smoker    Packs/day: 1.00    Years: 30.00    Pack  years: 30.00    Types: Cigarettes    Last attempt to quit: 05/06/2015    Years since quitting: 1.8  . Smokeless tobacco: Never Used  Substance Use Topics  . Alcohol use: No    Alcohol/week: 0.0 oz    Comment: Rarely  . Drug use: No     Allergies   Tamiflu; Estrogens; Wellbutrin [bupropion]; Ibuprofen; and Morphine and related   Review of Systems Review of Systems  Constitutional: Positive for diaphoresis. Negative for chills, fatigue and fever.  HENT: Negative for congestion.   Eyes: Negative for visual disturbance.  Respiratory: Positive for shortness of breath and wheezing. Negative for cough,  chest tightness and stridor.   Cardiovascular: Negative for chest pain, leg swelling and syncope.  Gastrointestinal: Positive for abdominal pain, constipation and nausea. Negative for anorexia, diarrhea, melena and vomiting.  Genitourinary: Negative for dysuria, flank pain and frequency.  Musculoskeletal: Negative for back pain, neck pain and neck stiffness.  Skin: Positive for color change and wound.  Neurological: Negative for light-headedness and headaches.  Psychiatric/Behavioral: Negative for agitation and confusion.  All other systems reviewed and are negative.    Physical Exam Updated Vital Signs BP 128/80 (BP Location: Left Arm)   Pulse (!) 108   Temp 98.2 F (36.8 C) (Oral)   Resp 19   SpO2 97%   Physical Exam  Constitutional: She is oriented to person, place, and time. She appears well-developed and well-nourished. She appears distressed.  HENT:  Head: Normocephalic.  Nose: Nose normal.  Mouth/Throat: Oropharynx is clear and moist. No oropharyngeal exudate.  Eyes: Conjunctivae are normal. Pupils are equal, round, and reactive to light.  Neck: No JVD present.  Cardiovascular: Intact distal pulses. Tachycardia present.  No murmur heard. Pulmonary/Chest: No stridor. Tachypnea noted. She is in respiratory distress. She has wheezes. She has no rhonchi. She has no  rales.  Abdominal: There is tenderness in the right lower quadrant. There is no rigidity, no rebound and no CVA tenderness.    Musculoskeletal: She exhibits no tenderness.  Neurological: She is alert and oriented to person, place, and time. No cranial nerve deficit.  Skin: Capillary refill takes less than 2 seconds. No rash noted. She is diaphoretic. There is erythema.  Psychiatric: She has a normal mood and affect.  Nursing note and vitals reviewed.        ED Treatments / Results  Labs (all labs ordered are listed, but only abnormal results are displayed) Labs Reviewed  COMPREHENSIVE METABOLIC PANEL - Abnormal; Notable for the following components:      Result Value   Glucose, Bld 117 (*)    Calcium 8.8 (*)    All other components within normal limits  CBC - Abnormal; Notable for the following components:   WBC 12.3 (*)    All other components within normal limits  URINALYSIS, ROUTINE W REFLEX MICROSCOPIC - Abnormal; Notable for the following components:   Color, Urine STRAW (*)    Specific Gravity, Urine >1.046 (*)    Hgb urine dipstick SMALL (*)    Squamous Epithelial / LPF 0-5 (*)    All other components within normal limits  HEMOGLOBIN A1C - Abnormal; Notable for the following components:   Hgb A1c MFr Bld 6.0 (*)    All other components within normal limits  I-STAT CG4 LACTIC ACID, ED - Abnormal; Notable for the following components:   Lactic Acid, Venous 3.45 (*)    All other components within normal limits  I-STAT CHEM 8, ED - Abnormal; Notable for the following components:   Potassium 3.0 (*)    Glucose, Bld 152 (*)    Calcium, Ion 1.06 (*)    All other components within normal limits  I-STAT CG4 LACTIC ACID, ED - Abnormal; Notable for the following components:   Lactic Acid, Venous 4.40 (*)    All other components within normal limits  CULTURE, BLOOD (ROUTINE X 2)  CULTURE, BLOOD (ROUTINE X 2)  LIPASE, BLOOD  PROTIME-INR    EKG  EKG  Interpretation  Date/Time:  Tuesday March 23 2017 21:42:13 EST Ventricular Rate:  123 PR Interval:    QRS Duration: 91 QT Interval:  329 QTC Calculation: 471 R Axis:   28 Text Interpretation:  Sinus tachycardia Inferior infarct, old Lateral leads are also involved No acute changes besides tachtyardia Confirmed by Varney Biles [phone]) on 03/24/2017 12:11:54 AM       Radiology Ct Abdomen Pelvis W Contrast  Result Date: 03/23/2017 CLINICAL DATA:  46 y/o F; history of anal cancer. Draining protrusion of right lower abdominal fall. EXAM: CT ABDOMEN AND PELVIS WITH CONTRAST TECHNIQUE: Multidetector CT imaging of the abdomen and pelvis was performed using the standard protocol following bolus administration of intravenous contrast. CONTRAST:  1105mL ISOVUE-300 IOPAMIDOL (ISOVUE-300) INJECTION 61% COMPARISON:  04/11/2012 CT abdomen and pelvis. FINDINGS: Lower chest: Right lower lobe platelike atelectasis. Hepatobiliary: Hepatic steatosis. No focal liver abnormality is seen. No gallstones, gallbladder wall thickening, or biliary dilatation. Pancreas: Unremarkable. No pancreatic ductal dilatation or surrounding inflammatory changes. Spleen: Normal in size without focal abnormality. Adrenals/Urinary Tract: Adrenal glands are unremarkable. Kidneys are normal, without renal calculi, focal lesion, or hydronephrosis. Bladder is unremarkable. Stomach/Bowel: Stomach is within normal limits. Appendix appears normal. No evidence of bowel wall thickening, distention, or inflammatory changes. Mild sigmoid diverticulosis. Vascular/Lymphatic: No significant vascular findings are present. Stable prominent right external iliac lymph nodes, less than 1 cm short axis. Reproductive: Simple right adnexal cyst measuring 23 mm. Hysterectomy. Other: Right lower abdominal wall 19 mm dermal lesion probably representing a cyst or abscess (series 2, image 82). Mild surrounding inflammatory changes in subcutaneous fat.  Musculoskeletal: No acute or significant osseous findings. IMPRESSION: 1. Right lower abdominal wall 19 mm dermal lesion, likely representing a sister abscess with mild surrounding inflammatory changes. 2. Hepatic steatosis. 3. Mild sigmoid diverticulosis. Electronically Signed   By: Kristine Garbe M.D.   On: 03/23/2017 21:23   Dg Chest Port 1 View  Result Date: 03/24/2017 CLINICAL DATA:  Respiratory distress EXAM: PORTABLE CHEST 1 VIEW COMPARISON:  Chest radiograph 03/23/2017 at 18:51 FINDINGS: Mild cardiomegaly. No focal airspace consolidation or pulmonary edema. No pneumothorax or pleural effusion. Shallow lung inflation. IMPRESSION: Shallow lung inflation and cardiomegaly without focal airspace disease. Electronically Signed   By: Ulyses Jarred M.D.   On: 03/24/2017 00:43   Dg Chest Portable 1 View  Result Date: 03/23/2017 CLINICAL DATA:  46 y/o  F; shortness of breath. EXAM: PORTABLE CHEST 1 VIEW COMPARISON:  None. FINDINGS: Stable heart size and mediastinal contours are within normal limits. Pulmonary venous hypertension. No consolidation, effusion, or pneumothorax. The visualized skeletal structures are unremarkable. IMPRESSION: Stable cardiac silhouette. Pulmonary venous hypertension. No consolidation. Electronically Signed   By: Kristine Garbe M.D.   On: 03/23/2017 19:16    Procedures Procedures (including critical care time)  CRITICAL CARE Performed by: Gwenyth Allegra Tegeler Total critical care time: 60 minutes Critical care time was exclusive of separately billable procedures and treating other patients. Critical care was necessary to treat or prevent imminent or life-threatening deterioration. Critical care was time spent personally by me on the following activities: development of treatment plan with patient and/or surrogate as well as nursing, discussions with consultants, evaluation of patient's response to treatment, examination of patient, obtaining history  from patient or surrogate, ordering and performing treatments and interventions, ordering and review of laboratory studies, ordering and review of radiographic studies, pulse oximetry and re-evaluation of patient's condition.   Medications Ordered in ED Medications  Tdap (BOOSTRIX) injection 0.5 mL (not administered)  iopamidol (ISOVUE-300) 61 % injection (not administered)  HYDROmorphone (DILAUDID) 1 MG/ML injection (not administered)  sodium chloride 0.9 % bolus 1,000  mL (1,000 mLs Intravenous New Bag/Given 03/23/17 2252)    And  sodium chloride 0.9 % bolus 1,000 mL (not administered)  vancomycin (VANCOCIN) 2,000 mg in sodium chloride 0.9 % 500 mL IVPB (2,000 mg Intravenous New Bag/Given 03/24/17 0031)  HYDROmorphone (DILAUDID) injection 1 mg (1 mg Intravenous Given 03/23/17 1906)  methylPREDNISolone sodium succinate (SOLU-MEDROL) 125 mg/2 mL injection 125 mg (125 mg Intravenous Given 03/23/17 1908)  albuterol (PROVENTIL,VENTOLIN) solution continuous neb (10 mg/hr Nebulization Given 03/23/17 1857)  ondansetron (ZOFRAN) injection 4 mg (4 mg Intravenous Given 03/23/17 1903)  ipratropium (ATROVENT) 0.02 % nebulizer solution (1 mg  Given 03/23/17 1857)  magnesium sulfate IVPB 2 g 50 mL (0 g Intravenous Stopped 03/23/17 2009)  iopamidol (ISOVUE-300) 61 % injection 100 mL (100 mLs Intravenous Contrast Given 03/23/17 2048)  HYDROmorphone (DILAUDID) injection 1 mg (1 mg Intravenous Given 03/23/17 2009)  diphenhydrAMINE (BENADRYL) injection 25 mg (25 mg Intravenous Given 03/23/17 2023)  sodium chloride 0.9 % bolus 1,000 mL (0 mLs Intravenous Stopped 03/23/17 2256)  piperacillin-tazobactam (ZOSYN) IVPB 3.375 g (0 g Intravenous Stopped 03/23/17 2250)  vancomycin (VANCOCIN) IVPB 1000 mg/200 mL premix (1,000 mg Intravenous New Bag/Given 03/23/17 2252)  HYDROmorphone (DILAUDID) injection 1 mg (1 mg Intravenous Given 03/23/17 2340)  ipratropium-albuterol (DUONEB) 0.5-2.5 (3) MG/3ML nebulizer solution 3 mL (3 mLs  Nebulization Given 03/23/17 2353)  ondansetron (ZOFRAN) injection 4 mg (4 mg Intravenous Given 03/24/17 0032)     Initial Impression / Assessment and Plan / ED Course  I have reviewed the triage vital signs and the nursing notes.  Pertinent labs & imaging results that were available during my care of the patient were reviewed by me and considered in my medical decision making (see chart for details).  Clinical Course as of Mar 25 127  Wed Mar 24, 2017  0025 RN asked me to reassess patient. Pt is having nausea and wants the Bipap removed. She has concerning respiratory hx. - with intubation on at least one occasion. Pt has had ARDS. Echo reviewed, pt has normal EF. Pt has no hx of PE, DVT and denies any exogenous hormone (testosterone / estrogen) use, long distance travels or surgery in the past 6 weeks, active cancer, recent immobilization. Exam reveals wheezing, with some of it audible. Pt denies anxiety hx. She was placed on nasal canula - 6 L (hypoxic on room air). Pt has received > 2.5 liters of ivf, so we will see if there is a pneumonia or pulm edema.  [AN]  0029 Sepsis reassessment completed.   [AN]    Clinical Course User Index [AN] Varney Biles, MD    LILLE KARIM is a 46 y.o. female with a past medical history significant for anal cancer, asthma, COPD, GERD, prior TIA, diabetes, and sleep apnea who presents with abdominal pain and abdominal wound.  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  She reports that she felt a "pop pop pop".  She reports this is the location of a prior C-section scar.  She then started having gradually worsening abdominal pain in the right lower quadrant.  Patient took Motrin and manage the pain for the first few days however it is continued to worsen.  She says it began becoming  red and hard in that location.  She says that the pain began  intensifying.  She says that she has not had a normal bowel movement since then but has had small constipated bowel movements.  She is still passing gas.  She has had nausea but no vomiting.  She denies fevers or chills.  Next  She reports that today, the area of pain and hardness erected from the skin.  Patient has a 1 cm area of bulging tissue.  Patient is concerned that it may be bowel from a hernia.  Patient has no history of hernias.  Patient says that after irruption, she decided to seek evaluation.   Patient reports her pain is 10 out of 10.  She says that the pain makes her breathing worse.  On my initial evaluation, patient had severe abdominal tenderness.  Clinical photo was taken however after palpation, patient had worsening discomfort.  Patient then started having worsened wheezing and respiratory distress.  Patient's lungs had wheezing in all fields.  Patient began having tachycardia.  Patient's abdomen was otherwise nontender.  No evidence of trauma.  Patient's care quickly escalated.  Patient started on continuous nebulizer treatment.  Patient also given Solu-Medrol.  Patient requested A1c be checked before her steroids as she was getting ready to have this done.  This was ordered for her.  Patient also was given magnesium.  Given the concern for possible incarcerated hernia with skin eruption and partial evisceration, patient will have CT imaging to further evaluate.  Tetanus will be updated as it was not found in her chart.  Patient will have other laboratory testing, pain medicine, nausea medicine, and fluids given.  Patient will be made n.p.o.  Anticipate reassessment after workup.  9:46 PM Patient's diagnostic testing results are seen above.  Mild leukocytosis.  Patient's lactic acid was elevated however after fluids it continued to rise.  Portable x-ray showed no evidence of pneumonia.  On CT scan, there is no  evidence of hernia evisceration, or other problem.  Patient was found to have a small, less than 2 cm abscess with inflammatory changes.  On reassessment, the abscess began to drain purulence.  It is open and draining.  A small amount of purulence was expressed.  Given the surrounding erythema, I suspect cellulitis.  Given the patient's rising lactic acid, patient will be treated for sepsis from the cellulitis.  Patient's breathing is much improved after the continuous nebulizer, steroids, and magnesium.  Broad-spectrum antibiotics were ordered.  Patient will be admitted for further management of her infection.   Final Clinical Impressions(s) / ED Diagnoses   Final diagnoses:  Sepsis due to cellulitis [facility])     Clinical Impression: 1. Sepsis due to cellulitis [facility])     Disposition: Admit to hospitalist service    Tegeler, Gwenyth Allegra, MD 03/24/17 [phone]

## 2017-03-24 ENCOUNTER — Other Ambulatory Visit: Payer: Self-pay

## 2017-03-24 ENCOUNTER — Emergency Department (HOSPITAL_COMMUNITY): Payer: 59

## 2017-03-24 ENCOUNTER — Encounter (HOSPITAL_COMMUNITY): Payer: Self-pay

## 2017-03-24 DIAGNOSIS — Z87891 Personal history of nicotine dependence: Secondary | ICD-10-CM

## 2017-03-24 DIAGNOSIS — J45901 Unspecified asthma with (acute) exacerbation: Secondary | ICD-10-CM

## 2017-03-24 DIAGNOSIS — F32A Depression, unspecified: Secondary | ICD-10-CM | POA: Diagnosis present

## 2017-03-24 DIAGNOSIS — A419 Sepsis, unspecified organism: Secondary | ICD-10-CM

## 2017-03-24 DIAGNOSIS — G8929 Other chronic pain: Secondary | ICD-10-CM | POA: Diagnosis present

## 2017-03-24 DIAGNOSIS — L0390 Cellulitis, unspecified: Secondary | ICD-10-CM

## 2017-03-24 DIAGNOSIS — F17200 Nicotine dependence, unspecified, uncomplicated: Secondary | ICD-10-CM

## 2017-03-24 DIAGNOSIS — L0291 Cutaneous abscess, unspecified: Secondary | ICD-10-CM | POA: Diagnosis present

## 2017-03-24 DIAGNOSIS — G893 Neoplasm related pain (acute) (chronic): Secondary | ICD-10-CM | POA: Insufficient documentation

## 2017-03-24 DIAGNOSIS — F419 Anxiety disorder, unspecified: Secondary | ICD-10-CM

## 2017-03-24 DIAGNOSIS — F329 Major depressive disorder, single episode, unspecified: Secondary | ICD-10-CM | POA: Diagnosis present

## 2017-03-24 HISTORY — DX: Personal history of nicotine dependence: Z87.891

## 2017-03-24 LAB — BASIC METABOLIC PANEL
Anion gap: 14 (ref 5–15)
BUN: 7 mg/dL (ref 6–20)
CALCIUM: 7.7 mg/dL — AB (ref 8.9–10.3)
CHLORIDE: 110 mmol/L (ref 101–111)
CO2: 17 mmol/L — AB (ref 22–32)
CREATININE: 0.79 mg/dL (ref 0.44–1.00)
GFR calc non Af Amer: >60 mL/min (ref >60)
Glucose, Bld: 212 mg/dL — ABNORMAL HIGH (ref 65–99)
Potassium: 3.6 mmol/L (ref 3.5–5.1)
SODIUM: 141 mmol/L (ref 135–145)

## 2017-03-24 LAB — MRSA PCR SCREENING: MRSA BY PCR: POSITIVE — AB

## 2017-03-24 LAB — CBC
HCT: 35 % — ABNORMAL LOW (ref 36.0–46.0)
Hemoglobin: 11.5 g/dL — ABNORMAL LOW (ref 12.0–15.0)
MCH: 29 pg (ref 26.0–34.0)
MCHC: 32.9 g/dL (ref 30.0–36.0)
MCV: 88.4 fL (ref 78.0–100.0)
PLATELETS: 320 10*3/uL (ref 150–400)
RBC: 3.96 MIL/uL (ref 3.87–5.11)
RDW: 14.1 % (ref 11.5–15.5)
WBC: 9.3 10*3/uL (ref 4.0–10.5)

## 2017-03-24 LAB — LACTIC ACID, PLASMA: Lactic Acid, Venous: 5.1 mmol/L (ref 0.5–1.9)

## 2017-03-24 LAB — BRAIN NATRIURETIC PEPTIDE: B Natriuretic Peptide: 21.9 pg/mL (ref 0.0–100.0)

## 2017-03-24 IMAGING — DX DG CHEST 1V PORT
1 series · 1 of 1 positions shown · non-contrast
Comparison: Chest radiograph [DATE] at [DATE]

CLINICAL DATA: Respiratory distress

EXAM:
PORTABLE CHEST 1 VIEW

[chest ap]
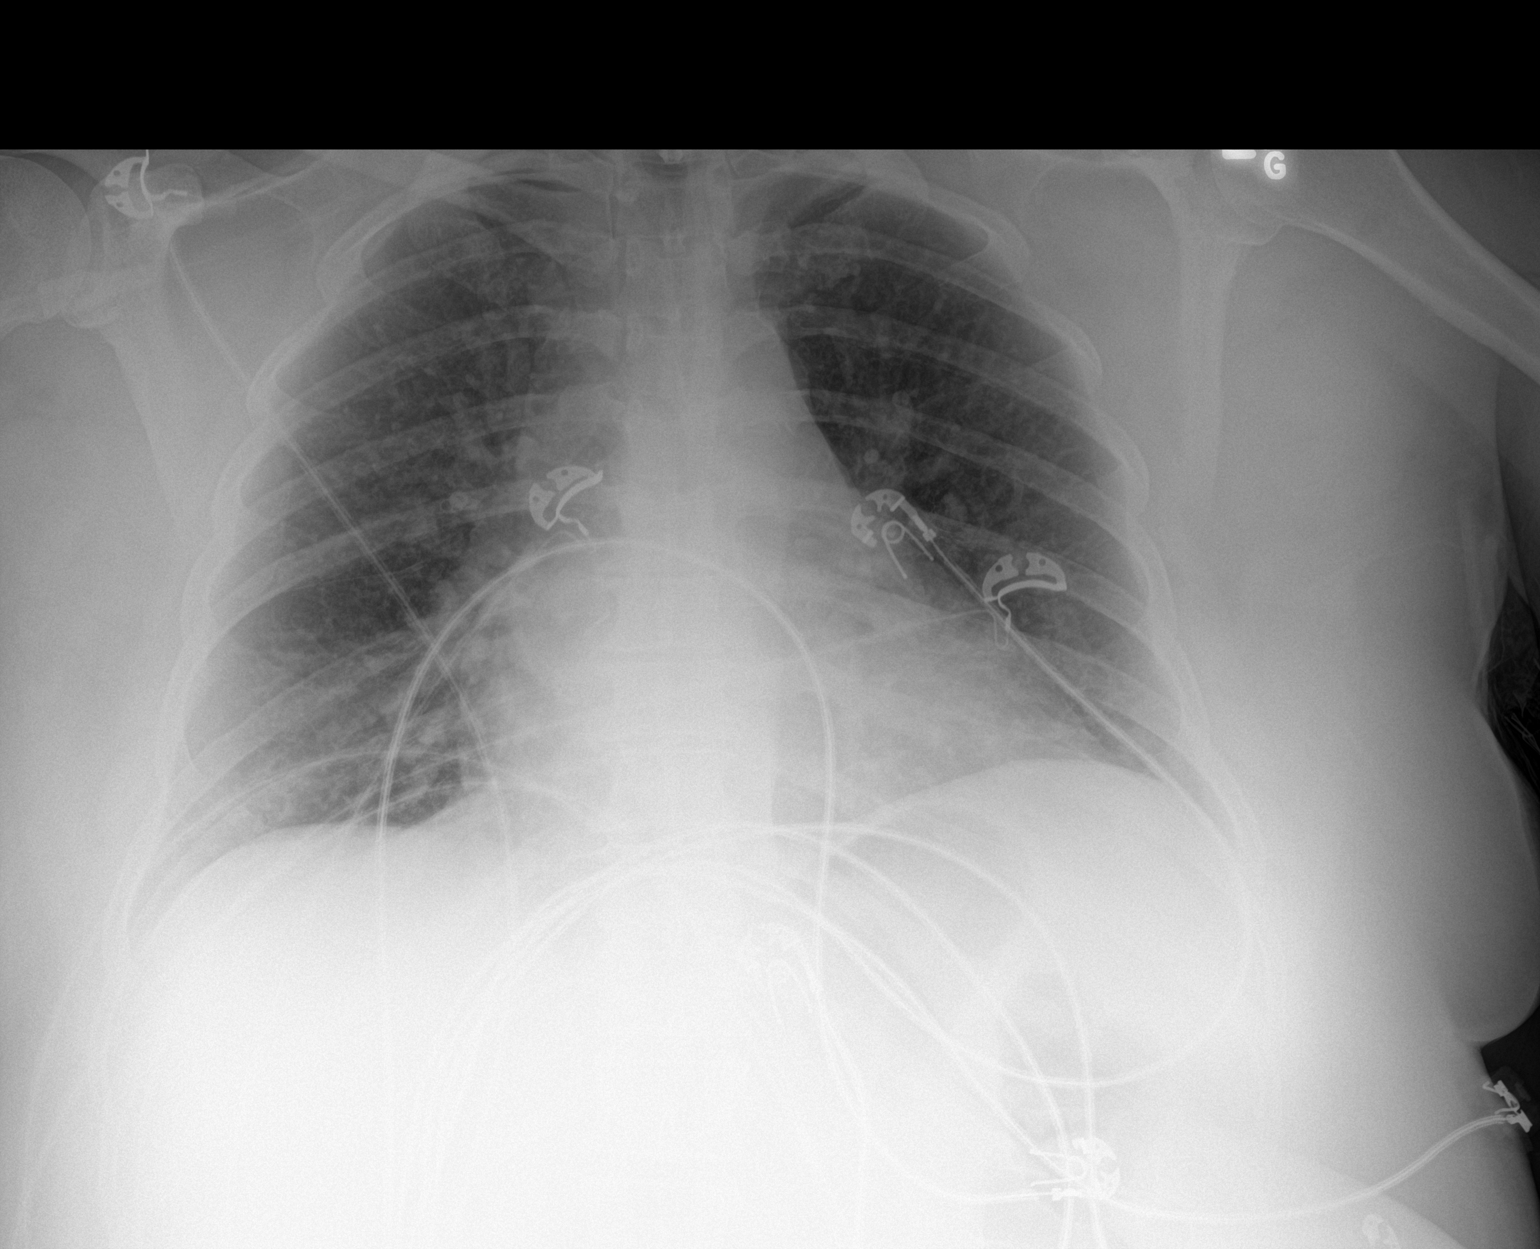

[1 of 1 positions shown; findings below may reference images not displayed]

FINDINGS: Mild cardiomegaly. No focal airspace consolidation or pulmonary
edema. No pneumothorax or pleural effusion. Shallow lung inflation.
IMPRESSION: Shallow lung inflation and cardiomegaly without focal airspace
disease.

## 2017-03-24 MED ORDER — MUPIROCIN 2 % EX OINT
1.0000 "application " | TOPICAL_OINTMENT | Freq: Two times a day (BID) | CUTANEOUS | Status: DC
  Administered 2017-03-24 – 2017-03-26 (×5): 1 via NASAL
  Filled 2017-03-24 (×2): qty 22

## 2017-03-24 MED ORDER — HYDROCODONE-ACETAMINOPHEN 10-325 MG PO TABS: 1.0000 | ORAL_TABLET | Freq: Four times a day (QID) | ORAL | Status: DC

## 2017-03-24 MED ORDER — POTASSIUM CHLORIDE IN NACL 20-0.45 MEQ/L-% IV SOLN
INTRAVENOUS | Status: DC
  Administered 2017-03-24 – 2017-03-25 (×2): via INTRAVENOUS
  Filled 2017-03-24 (×2): qty 1000

## 2017-03-24 MED ORDER — ORAL CARE MOUTH RINSE
15.0000 mL | Freq: Two times a day (BID) | OROMUCOSAL | Status: DC
  Administered 2017-03-24 – 2017-03-26 (×4): 15 mL via OROMUCOSAL

## 2017-03-24 MED ORDER — POLYETHYLENE GLYCOL 3350 17 G PO PACK
17.0000 g | PACK | Freq: Every day | ORAL | Status: DC | PRN
  Filled 2017-03-24: qty 1

## 2017-03-24 MED ORDER — BISACODYL 10 MG RE SUPP: 10.0000 mg | Freq: Every day | RECTAL | Status: DC | PRN

## 2017-03-24 MED ORDER — ONDANSETRON HCL 4 MG PO TABS
4.0000 mg | ORAL_TABLET | Freq: Four times a day (QID) | ORAL | Status: DC | PRN
  Administered 2017-03-25: 4 mg via ORAL
  Filled 2017-03-24: qty 1

## 2017-03-24 MED ORDER — ENOXAPARIN SODIUM 60 MG/0.6ML ~~LOC~~ SOLN
60.0000 mg | SUBCUTANEOUS | Status: DC
  Filled 2017-03-24 (×3): qty 0.6

## 2017-03-24 MED ORDER — TIOTROPIUM BROMIDE MONOHYDRATE 18 MCG IN CAPS
18.0000 ug | ORAL_CAPSULE | Freq: Every morning | RESPIRATORY_TRACT | Status: DC
  Administered 2017-03-24 – 2017-03-26 (×3): 18 ug via RESPIRATORY_TRACT
  Filled 2017-03-24: qty 5

## 2017-03-24 MED ORDER — ONDANSETRON HCL 4 MG/2ML IJ SOLN
4.0000 mg | Freq: Once | INTRAMUSCULAR | Status: AC
  Administered 2017-03-24: 4 mg via INTRAVENOUS
  Filled 2017-03-24: qty 2

## 2017-03-24 MED ORDER — VANCOMYCIN HCL IN DEXTROSE 1-5 GM/200ML-% IV SOLN
1000.0000 mg | Freq: Two times a day (BID) | INTRAVENOUS | Status: DC
  Administered 2017-03-24 – 2017-03-26 (×5): 1000 mg via INTRAVENOUS
  Filled 2017-03-24 (×5): qty 200

## 2017-03-24 MED ORDER — METHYLPREDNISOLONE SODIUM SUCC 125 MG IJ SOLR
80.0000 mg | Freq: Two times a day (BID) | INTRAMUSCULAR | Status: DC
  Administered 2017-03-24 – 2017-03-25 (×3): 80 mg via INTRAVENOUS
  Filled 2017-03-24 (×3): qty 2

## 2017-03-24 MED ORDER — VENLAFAXINE HCL ER 150 MG PO CP24
150.0000 mg | ORAL_CAPSULE | Freq: Every day | ORAL | Status: DC
  Administered 2017-03-24 – 2017-03-25 (×2): 150 mg via ORAL
  Filled 2017-03-24 (×2): qty 1

## 2017-03-24 MED ORDER — HYDROMORPHONE HCL 1 MG/ML IJ SOLN
1.0000 mg | Freq: Once | INTRAMUSCULAR | Status: AC
  Administered 2017-03-24: 1 mg via INTRAVENOUS
  Filled 2017-03-24: qty 1

## 2017-03-24 MED ORDER — PIPERACILLIN-TAZOBACTAM 3.375 G IVPB
3.3750 g | Freq: Three times a day (TID) | INTRAVENOUS | Status: DC
  Administered 2017-03-24 – 2017-03-26 (×8): 3.375 g via INTRAVENOUS
  Filled 2017-03-24 (×10): qty 50

## 2017-03-24 MED ORDER — MAGIC MOUTHWASH W/LIDOCAINE
10.0000 mL | Freq: Four times a day (QID) | ORAL | Status: DC | PRN
  Administered 2017-03-24: 10 mL via ORAL
  Filled 2017-03-24 (×2): qty 10

## 2017-03-24 MED ORDER — ALBUTEROL SULFATE (2.5 MG/3ML) 0.083% IN NEBU
2.5000 mg | INHALATION_SOLUTION | RESPIRATORY_TRACT | Status: DC | PRN
  Administered 2017-03-26: 2.5 mg via RESPIRATORY_TRACT
  Filled 2017-03-24 (×2): qty 3

## 2017-03-24 MED ORDER — ONDANSETRON HCL 4 MG/2ML IJ SOLN
4.0000 mg | Freq: Four times a day (QID) | INTRAMUSCULAR | Status: DC | PRN
  Administered 2017-03-24 – 2017-03-26 (×2): 4 mg via INTRAVENOUS
  Filled 2017-03-24 (×2): qty 2

## 2017-03-24 MED ORDER — CHLORHEXIDINE GLUCONATE CLOTH 2 % EX PADS: 6.0000 | MEDICATED_PAD | Freq: Every day | CUTANEOUS | Status: DC

## 2017-03-24 MED ORDER — PANTOPRAZOLE SODIUM 20 MG PO TBEC
20.0000 mg | DELAYED_RELEASE_TABLET | Freq: Two times a day (BID) | ORAL | Status: DC
  Administered 2017-03-24 – 2017-03-26 (×5): 20 mg via ORAL
  Filled 2017-03-24 (×6): qty 1

## 2017-03-24 MED ORDER — ACETAMINOPHEN 650 MG RE SUPP: 650.0000 mg | Freq: Four times a day (QID) | RECTAL | Status: DC | PRN

## 2017-03-24 MED ORDER — IPRATROPIUM-ALBUTEROL 0.5-2.5 (3) MG/3ML IN SOLN
3.0000 mL | Freq: Four times a day (QID) | RESPIRATORY_TRACT | Status: DC
  Administered 2017-03-24 – 2017-03-25 (×5): 3 mL via RESPIRATORY_TRACT
  Filled 2017-03-24 (×5): qty 3

## 2017-03-24 MED ORDER — LORATADINE 10 MG PO TABS
10.0000 mg | ORAL_TABLET | Freq: Every day | ORAL | Status: DC
  Administered 2017-03-24 – 2017-03-26 (×3): 10 mg via ORAL
  Filled 2017-03-24 (×3): qty 1

## 2017-03-24 MED ORDER — HYDROCODONE-ACETAMINOPHEN 10-325 MG PO TABS
1.0000 | ORAL_TABLET | ORAL | Status: DC | PRN
  Administered 2017-03-24 – 2017-03-26 (×5): 1 via ORAL
  Filled 2017-03-24 (×6): qty 1

## 2017-03-24 MED ORDER — ENOXAPARIN SODIUM 40 MG/0.4ML ~~LOC~~ SOLN
40.0000 mg | SUBCUTANEOUS | Status: DC
  Filled 2017-03-24: qty 0.4

## 2017-03-24 MED ORDER — CHLORHEXIDINE GLUCONATE 0.12 % MT SOLN
15.0000 mL | Freq: Two times a day (BID) | OROMUCOSAL | Status: DC
  Administered 2017-03-24 – 2017-03-26 (×4): 15 mL via OROMUCOSAL
  Filled 2017-03-24 (×5): qty 15

## 2017-03-24 MED ORDER — POLYETHYLENE GLYCOL 3350 17 G PO PACK
17.0000 g | PACK | Freq: Every day | ORAL | Status: DC | PRN
  Administered 2017-03-24: 17 g via ORAL

## 2017-03-24 MED ORDER — METFORMIN HCL ER 500 MG PO TB24
500.0000 mg | ORAL_TABLET | Freq: Two times a day (BID) | ORAL | Status: DC
  Filled 2017-03-24: qty 1

## 2017-03-24 MED ORDER — MOMETASONE FURO-FORMOTEROL FUM 200-5 MCG/ACT IN AERO
2.0000 | INHALATION_SPRAY | Freq: Two times a day (BID) | RESPIRATORY_TRACT | Status: DC
  Filled 2017-03-24: qty 8.8

## 2017-03-24 MED ORDER — ALPRAZOLAM 1 MG PO TABS
1.0000 mg | ORAL_TABLET | Freq: Two times a day (BID) | ORAL | Status: DC
  Administered 2017-03-24 – 2017-03-26 (×5): 1 mg via ORAL
  Filled 2017-03-24 (×5): qty 1

## 2017-03-24 MED ORDER — ACETAMINOPHEN 325 MG PO TABS: 650.0000 mg | ORAL_TABLET | Freq: Four times a day (QID) | ORAL | Status: DC | PRN

## 2017-03-24 NOTE — H&P (Signed)
[facility] History and Physical  Denise Ray [id number] DOB: Aug 19, 1970 DOA: 03/23/2017  Referring physician: Dr Glynda Jaeger PCP: [name], MD   Chief Complaint:  Dyspnea   HPI: Denise Ray is a 46 y.o. female w/ hx of COPD/ asthma, hx PNA, smoker, obesity presented to ED w/ 2 day history of RLQ pain.  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 She reports this is the location of a prior C-section scar. Today the skin in that area started bulging and she was worried about a hernia so came to the ED.  During ED MD's exam of her abdomen, she developed SOB and acute resp distress, exam showed diffuse wheezing and pt was started on nebs/ O2/ steroids IV and eventually required bipap which helped a lot.  She has a hx of intubation x 2 in the past for asthma/ COPD.  Since this happened the wound in her RLQ opened up and drained out pus.  We are asked to see for admission.   Rx in the ED so far includes: albuterol nebs / IV benadryl/ dilaudid/ 125 mg solumedrol/ zofran/ MgSO4/ zosyn/ NS bolus 2 L / IV vanc loading dose / IV contrast.     Patient feels much better on bipap.  No CP, no abd pain or n/v/d.  No joint pain.    Chart: Jun 03 - stage I cervical Ca, hx asthma May 14 - dysfunctional uterine bleeding/ chronic pelvic pain/ R adnexal mass >> admitted and underwent robot-assisted TLD with bilat salpingectomy.  Jun 15 - SOB/ dyspnea, rx for atypical PNA with iV abx , nebs, asthma exacerbation w/ pred taper. Left AMA to smoke. Rhinovirus swab +.  Thrush. Smoker.  Dec 16 - CAP, rx with IV abx and imrpoved.  April 18 - 45 yo with COPD came in for cough and congestion, admitted for PNA.  Has hx of prior episodes of intubation / mech ventilation.  Improved very slowly on IV abx and steroids. Extubated and dc'd home.     ROS  denies CP  no joint pain   no HA  no blurry vision  no rash  no diarrhea  no nausea/ vomiting  no dysuria  no difficulty voiding  no change in urine color    Past Medical History  Past Medical History:  Diagnosis Date  . Anal lesion   . Anxiety   . Arthritis    Right hip  . Borderline diabetes mellitus   . Chronic pain   . COPD with asthma ([place])    hx exacerbation's    . Diabetes mellitus without complication ([place])   . GERD (gastroesophageal reflux disease)   . Hemorrhoids   . History of acute respiratory failure    06/ 2015  and 12/ 2016  CAP and Asthma exacerbation--  vented both times (ARDS)  . History of anal dysplasia    AIN 2/ 3    . History of cervical dysplasia    CIN 2 in 2000  . History of hypothyroidism   . History of pneumonia    hx Required ventilatory support  . History of TIA (transient ischemic attack)    Caused by medication reaction  . Migraines   . OSA on CPAP    Past Surgical History  Past Surgical History:  Procedure Laterality  Date  . CESAREAN SECTION  2001  . CONIZATION OF CERVIX  2000  . DILATION AND CURETTAGE OF UTERUS    . HYSTEROSCOPY W/D&C  09/12/2007  &  2005  . TRANSTHORACIC ECHOCARDIOGRAM  05/07/2015   lvsf vigorous, ef 70-75%/  trivial MR and TR  . VIDEO ASSISTED THORACOSCOPY (VATS)/THOROCOTOMY Right 11/04/2001   w/ Resection duplication esophageal cyst   Family History  Family History  Problem Relation Age of Onset  . Breast cancer Brother 15  . Breast cancer Maternal Aunt   . Depression Mother   . Anesthesia problems Mother        post-op N/V  . Alcohol abuse Father   . Alcohol abuse Cousin    Social History  reports that she quit smoking about 22 months ago. Her smoking use included cigarettes. She has a 30.00 pack-year smoking history. she has never used smokeless tobacco. She reports that she does not drink alcohol or use drugs. Allergies  Allergies  Allergen Reactions  . Tamiflu Other (See Comments)  and Cough    BRONCHOCONSTRICTION  . Estrogens Other (See Comments)    SYMPTOMS OF A STROKE  . Wellbutrin [Bupropion] Other (See Comments)    CAUSED TIA WHEN TAKEN WITH ESTROGEN  . Ibuprofen Other (See Comments)    Avoids due to nosebleeds  . Morphine And Related Other (See Comments)    DOES NOT RELIEVE PAIN   Home medications Prior to Admission medications   Medication Sig Start Date End Date Taking? Authorizing Provider  albuterol (PROVENTIL HFA;VENTOLIN HFA) 108 (90 BASE) MCG/ACT inhaler Inhale 2 puffs into the lungs every 6 (six) hours as needed. wheezing 01/06/13  Yes Lysbeth Penner, FNP  albuterol (PROVENTIL) (5 MG/ML) 0.5% nebulizer solution Take 2.5 mg by nebulization every 6 (six) hours as needed for wheezing or shortness of breath.   Yes [provider]  ALPRAZolam Duanne Moron) 1 MG tablet Take 1 mg by mouth 2 (two) times daily. 11/05/13  Yes Whiteheart, Cristal Ford, NP  budesonide-formoterol (SYMBICORT) 160-4.5 MCG/ACT inhaler Inhale 2 puffs into the lungs 2 (two) times daily.   Yes [provider]  famotidine (PEPCID) 20 MG tablet Take 20 mg by mouth at bedtime.   Yes [provider]  furosemide (LASIX) 40 MG tablet Take 40 mg by mouth daily as needed.   Yes [provider]  guaiFENesin (MUCINEX) 600 MG 12 hr tablet Take 1 tablet (600 mg total) by mouth 2 (two) times daily. Patient taking differently: Take 600 mg by mouth 2 (two) times daily as needed.  09/11/16  Yes [name], MD  HYDROcodone-acetaminophen [facility] 10-325 MG tablet Take 1 tablet by mouth every 6 (six) hours. 08/04/16  Yes [provider]  lidocaine (XYLOCAINE) 2 % jelly Apply 1 application every 6 (six) hours as needed topically. 01/22/17  Yes [provider]  loratadine (CLARITIN) 10 MG tablet Take 10 mg by mouth daily.   Yes [provider]  magic mouthwash w/lidocaine SOLN Take 10 mLs 4 (four) times daily as needed by mouth for mouth pain (Thrush).    Yes [provider]  metFORMIN (GLUCOPHAGE XR) 500 MG 24 hr tablet Take 1 tablet (500 mg total) by mouth 2 (two) times daily. 09/11/16  Yes [name], MD  pantoprazole (PROTONIX) 40 MG tablet Take 40 mg by mouth 2 (two) times daily. 1/2 tablet twice a day   Yes [provider]  polyethylene glycol (MIRALAX / GLYCOLAX) packet Take 17 g by mouth daily as  needed for mild constipation.   Yes [provider]  tiotropium (SPIRIVA HANDIHALER) 18 MCG inhalation capsule Place 1 capsule (18 mcg total) into inhaler and inhale every morning. 09/11/16  Yes [name], MD  venlafaxine XR (EFFEXOR-XR) 75 MG 24 hr capsule Take 150 mg at bedtime by mouth. 01/22/17  Yes [provider]   Liver Function Tests Recent Labs  Lab 03/23/17 1834  AST 22  ALT 17  ALKPHOS 72  BILITOT 0.4  PROT 7.0  ALBUMIN 3.7   Recent Labs  Lab 03/23/17 1834  LIPASE 27   CBC Recent Labs  Lab 03/23/17 1834 03/23/17 1936  WBC 12.3*  --   HGB 13.8 12.9  HCT 40.8 38.0  MCV 87.7  --   PLT 394  --    Basic Metabolic Panel Recent Labs  Lab 03/23/17 1834 03/23/17 1936  NA 137 141  K 3.5 3.0*  CL 102 104  CO2 26  --   GLUCOSE 117* 152*  BUN 7 7  CREATININE 0.80 0.70  CALCIUM 8.8*  --      Vitals:   03/23/17 2222 03/23/17 2258 03/24/17 0016 03/24/17 0033  BP: (!) 126/49   133/83  Pulse:   (!) 123 (!) 123  Resp: 18  14 18   Temp:    98.1 F (36.7 C)  TempSrc:    Oral  SpO2: 94%  99% 99%  Weight:  117.9 kg (260 lb)     Exam: Gen obese, no distress, on bipap, calm No rash, cyanosis or gangrene Sclera anicteric, throat not seen  No jvd or bruits Chest good air movement now, mild coarse exp wheezing RRR no MRG Abd soft ntnd no mass or ascites +bs very obese RLQ shows an area where abscess ruptured , an open area, no gross pus now GU defer MS no joint effusions or deformity Ext no sig LE edema / no wounds or ulcers Neuro is alert, Ox 3 , nf  EKG (independ  reviewed) > Sinus tachycardia 123 bpm, old inf MI.  CXR (independ reviewed) > clear lungs, no acute disease CT abd/ pelvis w contrast  > 1. Right lower abdominal wall 19 mm dermal lesion, likely representing a sister abscess with mild surrounding inflammatory changes.  2. Hepatic steatosis. 3. Mild sigmoid diverticulosis.  Na 138  K 3.5  CO2 26  BUN 7  Cr 0.80    Ca 8.8   Alb 3.7  LFT's ok WBC 12k  Hb 13.8  plt 390    Glu 152 UA > clear , neg LE/ nitrite, 6.0, > 1.046, 0-5 wbc/ rbc/ epi   Home meds: -albuterol hfa prn/ alb nebs prn/ symbicort 2 puffs bid/ mucinex 600 bid/ tiotropium 18 ucg qd -effexor xr 75 take 2 daily/ norco 10-325 qid prn/ xanax 1mg  bid prn -metformin xr 500 bid -protonix 40/ pepcid 20 qhs/ claritin 10 qd/ miralax 17 gm qd prn -lasix 40 qd   Assessment: 1. R lower abdomen soft tissue abscess/ cellulitis - drained in ED, w/ small tissue defect now.  IV abx started w/ vanc/ zosyn.  2. Acute on chronic resp failure - due to COPD / asthma exacerbation. Cont IV steroids, bipap, nebs. Hx of intubation.  3. DM2 - cont metformin + SSI 4. Anxiety/ depression 5. Smoker 6. Chronic pain     Plan - as above       [place] D [facility] Pager [phone]   If 7PM-7AM, please contact night-coverage [email or web address] Password [facility] 03/24/2017, 12:47  AM

## 2017-03-24 NOTE — Care Management Note (Signed)
Case Management Note  Patient Details  Name: Denise Ray MRN: [id number] Date of Birth: 1970/10/31  Subjective/Objective:                  Gi bleed  Action/Plan: Date: March 24, 2017 Velva Harman, BSN, [place], [place]  [phone] Chart and notes review for patient progress and needs. Will follow for case management and discharge needs. Next review date: [id number]  Expected Discharge Date:                  Expected Discharge Plan:  Home/Self Care  In-House Referral:     Discharge planning Services  CM Consult  Post Acute Care Choice:    Choice offered to:     DME Arranged:    DME Agency:     HH Arranged:    [place] Agency:     Status of Service:  In process, will continue to follow  If discussed at Long Length of Stay Meetings, dates discussed:    Additional Comments:  Leeroy Cha, RN 03/24/2017, 8:42 AM

## 2017-03-24 NOTE — ED Notes (Addendum)
Patient requesting pain medication-remains on bipap-mid level paged-patient declines po hydrocodone at this time

## 2017-03-24 NOTE — Progress Notes (Signed)
CRITICAL VALUE ALERT  Critical Value:  Positive MRSA PCR  Date & Time Notied:  03/24/17 @ 0934  Provider Notified: Patient placed on contact precautions per protocol  Orders Received/Actions taken: Contact precautions initiated, order set for positive MRSA placed, and patient educated.

## 2017-03-24 NOTE — Progress Notes (Signed)
CRITICAL VALUE ALERT  Critical Value:  Lactic acid 5.1  Date & Time Notied:  03/24/17 @ 9774  Provider Notified: Venia Minks, MD  Orders Received/Actions taken: No new orders given, MD aware of result

## 2017-03-24 NOTE — ED Provider Notes (Addendum)
  Physical Exam  BP (!) 126/49   Pulse (!) 123   Temp 98.2 F (36.8 C) (Oral)   Resp 14   Wt 117.9 kg (260 lb)   SpO2 99%   BMI 44.63 kg/m   Physical Exam  ED Course  Procedures  MDM  Assuming care of patient from Dr. Gustavus Messing.   Patient in the ED for intra-abd abscess, confirmed on CT. Workup thus far shows rising lactate. Code sepsis initiated.  Concerning findings are as following - rising lactate. Important pending results are none.  According to Dr. Gustavus Messing, plan is to admit   Patient had no complains, no concerns from the nursing side. Will continue to monitor.      0029          RN asked me to reassess patient.  Pt is having nausea and wants the Bipap removed.  She has concerning respiratory hx. - with intubation on at least one occasion. Pt has had ARDS. Echo reviewed, pt has normal EF. Pt has no hx of PE, DVT and denies any exogenous hormone (testosterone / estrogen) use, long distance travels or surgery in the past 6 weeks, active cancer, recent immobilization.  Exam reveals wheezing, with some of it audible.  Pt denies anxiety hx. She was placed on nasal canula - 6 L (hypoxic on room air). Pt has received > 2.5 liters of ivf, so we will see if there is a pneumonia or pulm edema.     12:31 AM  Sepsis reassessment completed.   1:49 AM CXR reviewed, and is unchanged. Pt reassess, she appears more comfortable. Still tachycardic. CXR had read ? pulm HTN type findings. We will get a BNP - medicine  can follow.    CRITICAL CARE Performed by: Varney Biles   Total critical care time: 31 minutes  Critical care time was exclusive of separately billable procedures and treating other patients.  Critical care was necessary to treat or prevent imminent or life-threatening deterioration.  Critical care was time spent personally by me on the following activities: development of treatment plan with patient and/or surrogate as well as nursing, discussions with  consultants, evaluation of patient's response to treatment, examination of patient, obtaining history from patient or surrogate, ordering and performing treatments and interventions, ordering and review of laboratory studies, ordering and review of radiographic studies, pulse oximetry and re-evaluation of patient's condition.    Varney Biles, MD 03/24/17 [phone]

## 2017-03-24 NOTE — Progress Notes (Signed)
Pharmacy Antibiotic Note  Denise Ray is a 46 y.o. female with dyspnea admitted on 03/23/2017 with cellulitis with abscess  Pharmacy has been consulted for zosyn and vancomycin dosing.  Plan: Zosyn 3.375g IV q8h (4 hour infusion).  Vancomycin ~ 2300 mg load then 1 Gm IV q12h for est AUC=500 Goal AUC 400-500 Daily Scr F/u cultures/levels  Weight: 260 lb (117.9 kg)  Temp (24hrs), Avg:98.2 F (36.8 C), Min:98.1 F (36.7 C), Max:98.2 F (36.8 C)  Recent Labs  Lab 03/23/17 1834 03/23/17 1936 03/23/17 1937 03/23/17 2113  WBC 12.3*  --   --   --   CREATININE 0.80 0.70  --   --   LATICACIDVEN  --   --  3.45* 4.40*    Estimated Creatinine Clearance: 113.3 mL/min (by C-G formula based on SCr of 0.7 mg/dL).    Allergies  Allergen Reactions  . Tamiflu Other (See Comments) and Cough    BRONCHOCONSTRICTION  . Estrogens Other (See Comments)    SYMPTOMS OF A STROKE  . Wellbutrin [Bupropion] Other (See Comments)    CAUSED TIA WHEN TAKEN WITH ESTROGEN  . Ibuprofen Other (See Comments)    Avoids due to nosebleeds  . Morphine And Related Other (See Comments)    DOES NOT RELIEVE PAIN    Antimicrobials this admission: 11/6 zosyn >>  11/6 vancomycin >>   Dose adjustments this admission:   Microbiology results:  BCx:   UCx:    Sputum:    MRSA PCR:   Thank you for allowing pharmacy to be a part of this patient's care.  Dorrene German 03/24/2017 3:17 AM

## 2017-03-24 NOTE — ED Notes (Signed)
Report to [facility] in the unit

## 2017-03-24 NOTE — Progress Notes (Signed)
Pt given scheduled nebulizer treatment which the pt tolerated well.  Pt found on 2lnc.  No increased wob or respiratory distress noted or voiced by pt at this time.  Bipap held per pt request until later in the night.  This RT will reassess and monitor pt as needed.

## 2017-03-24 NOTE — Progress Notes (Signed)
Denise Ray is a 46 y.o. female w/ hx of COPD/ asthma, hx PNA, smoker, obesity presented to ED w/ 2 day history of RLQ pain, she was found to have abd wall abscess and in acute on chronic respiratory failure.    Plan:  1. S/p I&d in ED, on IV antibiotics 2. Resume IV steroids and duonebs.  3. CPAP at night.  4. SSI for DM.  5. Advance diet as tolerated.  6. Plan for transfer to telemetry later today if she remains stable.    Hosie Poisson, MD [phone]

## 2017-03-24 NOTE — Consult Note (Signed)
[facility] Nurse wound consult note Reason for Consult:RLQ full thickness wound, at lateral edge of previous scar from C Section. Per history, felt a "pop" or strain after pulling a patient up in bed last week. Wound type: Full thickness, small to moderate amount of light yellow to light tan exudate, no odor. Pressure Injury POA: NA Measurement:1.5cm x 2.5cm with depth not known; at least 1cm.  Patient does not tolerate exploration of defect. Wound bed: dark red tissue is protruding from aperture, moist Drainage (amount, consistency, odor) as described above Periwound: with erythema, induration and mild warmth Dressing procedure/placement/frequency: My first impression was that patient has sustained a mild herniation at the site, but CT indicates an infectious process. I will provide a topical dressing to provide antimicrobial donation as well as absorptive capacity, but recommend CCS input for definitive diagnosis and POC.  If you agree, please order/arrange for consult. [facility] nursing team will not follow, but will remain available to this patient, the nursing and medical teams.  Please re-consult if needed. Thanks, Maudie Flakes, MSN, RN, [place], [facility]  Pager# [phone]

## 2017-03-25 DIAGNOSIS — K219 Gastro-esophageal reflux disease without esophagitis: Secondary | ICD-10-CM | POA: Diagnosis present

## 2017-03-25 DIAGNOSIS — J441 Chronic obstructive pulmonary disease with (acute) exacerbation: Secondary | ICD-10-CM | POA: Diagnosis not present

## 2017-03-25 DIAGNOSIS — Z79899 Other long term (current) drug therapy: Secondary | ICD-10-CM | POA: Diagnosis not present

## 2017-03-25 DIAGNOSIS — L0291 Cutaneous abscess, unspecified: Secondary | ICD-10-CM | POA: Diagnosis not present

## 2017-03-25 DIAGNOSIS — E669 Obesity, unspecified: Secondary | ICD-10-CM | POA: Diagnosis present

## 2017-03-25 DIAGNOSIS — Z87891 Personal history of nicotine dependence: Secondary | ICD-10-CM | POA: Diagnosis not present

## 2017-03-25 DIAGNOSIS — Z8541 Personal history of malignant neoplasm of cervix uteri: Secondary | ICD-10-CM | POA: Diagnosis not present

## 2017-03-25 DIAGNOSIS — R0603 Acute respiratory distress: Secondary | ICD-10-CM | POA: Diagnosis not present

## 2017-03-25 DIAGNOSIS — J9601 Acute respiratory failure with hypoxia: Secondary | ICD-10-CM

## 2017-03-25 DIAGNOSIS — G4733 Obstructive sleep apnea (adult) (pediatric): Secondary | ICD-10-CM | POA: Diagnosis present

## 2017-03-25 DIAGNOSIS — Z6841 Body Mass Index (BMI) 40.0 and over, adult: Secondary | ICD-10-CM | POA: Diagnosis not present

## 2017-03-25 DIAGNOSIS — F329 Major depressive disorder, single episode, unspecified: Secondary | ICD-10-CM | POA: Diagnosis present

## 2017-03-25 DIAGNOSIS — L02211 Cutaneous abscess of abdominal wall: Secondary | ICD-10-CM | POA: Diagnosis not present

## 2017-03-25 DIAGNOSIS — L0390 Cellulitis, unspecified: Secondary | ICD-10-CM | POA: Diagnosis present

## 2017-03-25 DIAGNOSIS — G8929 Other chronic pain: Secondary | ICD-10-CM | POA: Diagnosis not present

## 2017-03-25 DIAGNOSIS — J45901 Unspecified asthma with (acute) exacerbation: Secondary | ICD-10-CM | POA: Diagnosis present

## 2017-03-25 DIAGNOSIS — E039 Hypothyroidism, unspecified: Secondary | ICD-10-CM | POA: Diagnosis present

## 2017-03-25 DIAGNOSIS — J9621 Acute and chronic respiratory failure with hypoxia: Secondary | ICD-10-CM | POA: Diagnosis not present

## 2017-03-25 DIAGNOSIS — Z7984 Long term (current) use of oral hypoglycemic drugs: Secondary | ICD-10-CM | POA: Diagnosis not present

## 2017-03-25 DIAGNOSIS — F419 Anxiety disorder, unspecified: Secondary | ICD-10-CM | POA: Diagnosis present

## 2017-03-25 DIAGNOSIS — E119 Type 2 diabetes mellitus without complications: Secondary | ICD-10-CM | POA: Diagnosis not present

## 2017-03-25 DIAGNOSIS — Z8673 Personal history of transient ischemic attack (TIA), and cerebral infarction without residual deficits: Secondary | ICD-10-CM | POA: Diagnosis not present

## 2017-03-25 DIAGNOSIS — Z8701 Personal history of pneumonia (recurrent): Secondary | ICD-10-CM | POA: Diagnosis not present

## 2017-03-25 LAB — GLUCOSE, CAPILLARY
GLUCOSE-CAPILLARY: 202 mg/dL — AB (ref 65–99)
Glucose-Capillary: 237 mg/dL — ABNORMAL HIGH (ref 65–99)

## 2017-03-25 LAB — CREATININE, SERUM
Creatinine, Ser: 0.72 mg/dL (ref 0.44–1.00)
GFR calc non Af Amer: >60 mL/min (ref >60)

## 2017-03-25 MED ORDER — LIDOCAINE HCL (PF) 2 % IJ SOLN
10.0000 mL | Freq: Once | INTRAMUSCULAR | Status: DC | PRN
  Filled 2017-03-25: qty 10

## 2017-03-25 MED ORDER — LIDOCAINE HCL 2 % IJ SOLN
INTRAMUSCULAR | Status: AC
  Filled 2017-03-25: qty 20

## 2017-03-25 MED ORDER — ALBUTEROL SULFATE (2.5 MG/3ML) 0.083% IN NEBU
2.5000 mg | INHALATION_SOLUTION | Freq: Four times a day (QID) | RESPIRATORY_TRACT | Status: DC
  Administered 2017-03-25 – 2017-03-26 (×4): 2.5 mg via RESPIRATORY_TRACT
  Filled 2017-03-25 (×4): qty 3

## 2017-03-25 MED ORDER — INSULIN ASPART 100 UNIT/ML ~~LOC~~ SOLN
0.0000 [IU] | Freq: Three times a day (TID) | SUBCUTANEOUS | Status: DC
  Administered 2017-03-25: 7 [IU] via SUBCUTANEOUS
  Administered 2017-03-26: 11 [IU] via SUBCUTANEOUS
  Administered 2017-03-26: 7 [IU] via SUBCUTANEOUS

## 2017-03-25 MED ORDER — INSULIN ASPART 100 UNIT/ML ~~LOC~~ SOLN
0.0000 [IU] | Freq: Every day | SUBCUTANEOUS | Status: DC
Start: 2017-03-25 — End: 2017-03-26
  Administered 2017-03-25: 2 [IU] via SUBCUTANEOUS

## 2017-03-25 MED ORDER — METHYLPREDNISOLONE SODIUM SUCC 40 MG IJ SOLR
40.0000 mg | Freq: Two times a day (BID) | INTRAMUSCULAR | Status: DC
  Administered 2017-03-25 – 2017-03-26 (×2): 40 mg via INTRAVENOUS
  Filled 2017-03-25 (×2): qty 1

## 2017-03-25 NOTE — Procedures (Signed)
Incision and Drainage Procedure Note  Pre-operative Diagnosis: abdominal wall abscess  Post-operative Diagnosis: same  Indications: abdominal wall abscess with persistent tenderness and fluctuance despite spontaneous drainage 48 hours ago.  Anesthesia: 2% plain lidocaine  Procedure Details  The procedure, risks and complications have been discussed in detail (including, but not limited to airway compromise, infection, bleeding) with the patient, and the patient has signed consent to the procedure.  The skin was sterilely prepped over the affected area in the usual fashion. After adequate local anesthesia, I&D with a #11 blade was performed on the right lower abdominal wall. 1-2 cc purulent and sanguinous drainage was encountered. The patient was observed until stable.  Findings: Small amount purulent and sanguinous drainage  EBL: none   Drains: none   Condition: Tolerated procedure well   Complications: none.   Wound care: daily wet to dry dressing change with moistened 2x2 gauze, cover with dry gauze. Clean wound daily. Keep skin folds clean and dry. Follow up in our office in one week for wound check.   Obie Dredge, PA-C [facility] Pager: [phone] Consults: [phone] Mon-Fri 7:00 am-4:30 pm Sat-Sun 7:00 am-11:30 am

## 2017-03-25 NOTE — Consult Note (Signed)
[facility] Surgery Consult Note  Denise Ray 1971-04-11  [id number].    Requesting MD: Karleen Hampshire, MD Chief Complaint/Reason for Consult: Abdominal wall abscess  HPI:  46 y/o F with PMH COPD, severe asthma, previous intubations 2/2 ARDS, DM2, anxiety/depression, tobacco use, obesity, and c-section over 15 years ago who presented to [facility] with a cc RLQ pain. She works as a Statistician and states that about a week ago she was lifting a patient up in bed when she felt a "pop" in her abdomen associated with pain. Days after this incident the pain remained and she noticed a bulge in her abdominal wall. She was concerned about a hernia so she went to ED for evaluation. While in the ED she developed acute respiratory distress requiring BiPap and IV steroids. She reports that her abdominal wall was evaluated in the ED and it started spontaneously draining when a doctor pushed on it.   Workup revealed a leukocytosis 12.3; blood cultures negative; UA negative Lactic acid 3.45 > 4.40 > 5.1  CT abdomen pelvis showed a small, 14m abdominal wall abscess. No acute intraabdominal findings.  CXR w/ pulmonary HTN. No PNA or effusion.   The patient was admitted to the medical service and started on vanc/zosyn for abdominal wall infection. Leukocytosis resolved. General surgery has been asked to evaluate abdominal wall for possible I&D.   ROS: Review of Systems  Constitutional: Negative for chills and fever.  Respiratory: Negative for shortness of breath.   Cardiovascular: Negative for chest pain.  Gastrointestinal: Positive for abdominal pain. Negative for nausea and vomiting.  All other systems reviewed and are negative.   Family History  Problem Relation Age of Onset  . Breast cancer Brother 15  . Breast cancer Maternal Aunt   . Depression Mother   . Anesthesia problems Mother        post-op N/V  . Alcohol abuse Father   . Alcohol abuse Cousin     Past Medical History:  Diagnosis  Date  . Anal lesion   . Anxiety   . Arthritis    Right hip  . Borderline diabetes mellitus   . Chronic pain   . COPD with asthma ([place]    hx exacerbation's    . Diabetes mellitus without complication ([place]   . GERD (gastroesophageal reflux disease)   . Hemorrhoids   . History of acute respiratory failure    06/ 2015  and 12/ 2016  CAP and Asthma exacerbation--  vented both times (ARDS)  . History of anal dysplasia    AIN 2/ 3    . History of cervical dysplasia    CIN 2 in 2000  . History of hypothyroidism   . History of pneumonia    hx Required ventilatory support  . History of TIA (transient ischemic attack)    Caused by medication reaction  . Migraines   . OSA on CPAP     Past Surgical History:  Procedure Laterality Date  . CESAREAN SECTION  2001  . CONIZATION OF CERVIX  2000  . DILATION AND CURETTAGE OF UTERUS    . HYSTEROSCOPY W/D&C  09/12/2007  &  2005  . TRANSTHORACIC ECHOCARDIOGRAM  05/07/2015   lvsf vigorous, ef 70-75%/  trivial MR and TR  . VIDEO ASSISTED THORACOSCOPY (VATS)/THOROCOTOMY Right 11/04/2001   w/ Resection duplication esophageal cyst    Social History:  reports that she quit smoking about 22 months ago. Her smoking use included cigarettes. She has a 30.00 pack-year smoking history.  she has never used smokeless tobacco. She reports that she does not drink alcohol or use drugs.  Allergies:  Allergies  Allergen Reactions  . Tamiflu Other (See Comments) and Cough    BRONCHOCONSTRICTION  . Estrogens Other (See Comments)    SYMPTOMS OF A STROKE  . Wellbutrin [Bupropion] Other (See Comments)    CAUSED TIA WHEN TAKEN WITH ESTROGEN  . Ibuprofen Other (See Comments)    Avoids due to nosebleeds  . Morphine And Related Other (See Comments)    DOES NOT RELIEVE PAIN    Medications Prior to Admission  Medication Sig Dispense Refill  . albuterol (PROVENTIL HFA;VENTOLIN HFA) 108 (90 BASE) MCG/ACT inhaler Inhale 2 puffs into the lungs every 6 (six) hours as  needed. wheezing 1 Inhaler 11  . albuterol (PROVENTIL) (5 MG/ML) 0.5% nebulizer solution Take 2.5 mg by nebulization every 6 (six) hours as needed for wheezing or shortness of breath.    . ALPRAZolam (XANAX) 1 MG tablet Take 1 mg by mouth 2 (two) times daily.    . budesonide-formoterol (SYMBICORT) 160-4.5 MCG/ACT inhaler Inhale 2 puffs into the lungs 2 (two) times daily.    . famotidine (PEPCID) 20 MG tablet Take 20 mg by mouth at bedtime.    . furosemide (LASIX) 40 MG tablet Take 40 mg by mouth daily as needed.    [name] guaiFENesin (MUCINEX) 600 MG 12 hr tablet Take 1 tablet (600 mg total) by mouth 2 (two) times daily. (Patient taking differently: Take 600 mg by mouth 2 (two) times daily as needed. ) 30 tablet 1  . HYDROcodone-acetaminophen (NORCO) 10-325 MG tablet Take 1 tablet by mouth every 6 (six) hours.  0  . lidocaine (XYLOCAINE) 2 % jelly Apply 1 application every 6 (six) hours as needed topically.  3  . loratadine (CLARITIN) 10 MG tablet Take 10 mg by mouth daily.    . magic mouthwash w/lidocaine SOLN Take 10 mLs 4 (four) times daily as needed by mouth for mouth pain (Thrush).    . metFORMIN (GLUCOPHAGE XR) 500 MG 24 hr tablet Take 1 tablet (500 mg total) by mouth 2 (two) times daily. 60 tablet 12  . pantoprazole (PROTONIX) 40 MG tablet Take 40 mg by mouth 2 (two) times daily. 1/2 tablet twice a day    . polyethylene glycol (MIRALAX / GLYCOLAX) packet Take 17 g by mouth daily as needed for mild constipation.    [name] tiotropium (SPIRIVA HANDIHALER) 18 MCG inhalation capsule Place 1 capsule (18 mcg total) into inhaler and inhale every morning. 30 capsule 12  . venlafaxine XR (EFFEXOR-XR) 75 MG 24 hr capsule Take 150 mg at bedtime by mouth.  1    Blood pressure 136/65, pulse 99, temperature 97.9 F (36.6 C), temperature source Oral, resp. rate 14, height 5' 4"  (1.626 m), weight 120.4 kg (265 lb 6.9 oz), SpO2 95 %. Physical Exam: Physical Exam  Constitutional: She is oriented to person, place,  and time. She appears well-developed. No distress.  HENT:  Head: Normocephalic and atraumatic.  Right Ear: External ear normal.  Left Ear: External ear normal.  Nose: Nose normal.  Eyes: Pupils are equal, round, and reactive to light. Right eye exhibits no discharge. Left eye exhibits no discharge.  Neck: Normal range of motion. No tracheal deviation present.  Cardiovascular: Normal rate, regular rhythm and normal heart sounds. Exam reveals no friction rub.  No murmur heard. Pulmonary/Chest: Effort normal and breath sounds normal. No stridor. No respiratory distress. She has no rales. She  exhibits no tenderness.  Abdominal: Soft. Bowel sounds are normal. She exhibits no distension and no mass. There is no rebound and no guarding.  Right lower abdominal wall with ~ 1.5 x 0.5 cm abdominal wound with overlying slough. Deep pressure applied around wound elicits small amount purulent drainage. There is no erythema. There is mild induration underlying the wound. (image below)  Musculoskeletal: Normal range of motion. She exhibits no edema, tenderness or deformity.  Lymphadenopathy:    She has no cervical adenopathy.  Neurological: She is alert and oriented to person, place, and time. No sensory deficit.  Skin: Skin is warm and dry. No rash noted. She is not diaphoretic. No pallor.  Psychiatric: She has a normal mood and affect. Her behavior is normal.       Today (11/8)      11/6 after opened in ED.     Results for orders placed or performed during the hospital encounter of 03/23/17 (from the past 48 hour(s))  Lipase, blood     Status: None   Collection Time: 03/23/17  6:34 PM  Result Value Ref Range   Lipase 27 11 - 51 U/L    Comment: Performed at [facility], [place] [address] [place], [place] [address]  Comprehensive metabolic panel     Status: Abnormal   Collection Time: 03/23/17  6:34 PM  Result Value Ref Range   Sodium 137 135 - 145 mmol/L   Potassium 3.5 3.5 - 5.1 mmol/L    Chloride 102 101 - 111 mmol/L   CO2 26 22 - 32 mmol/L   Glucose, Bld 117 (H) 65 - 99 mg/dL   BUN 7 6 - 20 mg/dL   Creatinine, Ser 0.80 0.44 - 1.00 mg/dL   Calcium 8.8 (L) 8.9 - 10.3 mg/dL   Total Protein 7.0 6.5 - 8.1 g/dL   Albumin 3.7 3.5 - 5.0 g/dL   AST 22 15 - 41 U/L   ALT 17 14 - 54 U/L   Alkaline Phosphatase 72 38 - 126 U/L   Total Bilirubin 0.4 0.3 - 1.2 mg/dL   GFR calc non Af Amer >60 >60 mL/min   GFR calc Af Amer >60 >60 mL/min    Comment: (NOTE) The eGFR has been calculated using the CKD EPI equation. This calculation has not been validated in all clinical situations. eGFR's persistently <60 mL/min signify possible Chronic Kidney Disease.    Anion gap 9 5 - 15    Comment: Performed at [facility] [address] [place], [place] [address]  CBC     Status: Abnormal   Collection Time: 03/23/17  6:34 PM  Result Value Ref Range   WBC 12.3 (H) 4.0 - 10.5 K/uL   RBC 4.65 3.87 - 5.11 MIL/uL   Hemoglobin 13.8 12.0 - 15.0 g/dL   HCT 40.8 36.0 - 46.0 %   MCV 87.7 78.0 - 100.0 fL   MCH 29.7 26.0 - 34.0 pg   MCHC 33.8 30.0 - 36.0 g/dL   RDW 13.9 11.5 - 15.5 %   Platelets 394 150 - 400 K/uL  Protime-INR     Status: None   Collection Time: 03/23/17  6:34 PM  Result Value Ref Range   Prothrombin Time 12.0 11.4 - 15.2 seconds   INR 0.89   Hemoglobin A1c     Status: Abnormal   Collection Time: 03/23/17  6:34 PM  Result Value Ref Range   Hgb A1c MFr Bld 6.0 (H) 4.8 - 5.6 %  Comment: (NOTE) Pre diabetes:          5.7%-6.4% Diabetes:              >6.4% Glycemic control for   <7.0% adults with diabetes    Mean Plasma Glucose 125.5 mg/dL    Comment: Performed at [facility] [address], [place], [place] [address]  Blood culture (routine x 2)     Status: None (Preliminary result)   Collection Time: 03/23/17  6:51 PM  Result Value Ref Range   Specimen Description BLOOD RIGHT FOREARM    Special Requests      IN PEDIATRIC BOTTLE Blood Culture results may  not be optimal due to an excessive volume of blood received in culture bottles   Culture      NO GROWTH 2 DAYS Performed at [facility] [address], [place], [place] [address]    Report Status PENDING   Blood culture (routine x 2)     Status: None (Preliminary result)   Collection Time: 03/23/17  6:56 PM  Result Value Ref Range   Specimen Description BLOOD LEFT FOREARM    Special Requests      BOTTLES DRAWN AEROBIC AND ANAEROBIC Blood Culture results may not be optimal due to an inadequate volume of blood received in culture bottles   Culture      NO GROWTH 2 DAYS Performed at [facility], [place] [address], [place], [place] [address]    Report Status PENDING   I-Stat Chem 8, ED     Status: Abnormal   Collection Time: 03/23/17  7:36 PM  Result Value Ref Range   Sodium 141 135 - 145 mmol/L   Potassium 3.0 (L) 3.5 - 5.1 mmol/L   Chloride 104 101 - 111 mmol/L   BUN 7 6 - 20 mg/dL   Creatinine, Ser 0.70 0.44 - 1.00 mg/dL   Glucose, Bld 152 (H) 65 - 99 mg/dL   Calcium, Ion 1.06 (L) 1.15 - 1.40 mmol/L   TCO2 24 22 - 32 mmol/L   Hemoglobin 12.9 12.0 - 15.0 g/dL   HCT 38.0 36.0 - 46.0 %  I-Stat CG4 Lactic Acid, ED     Status: Abnormal   Collection Time: 03/23/17  7:37 PM  Result Value Ref Range   Lactic Acid, Venous 3.45 (HH) 0.5 - 1.9 mmol/L   Comment NOTIFIED PHYSICIAN   I-Stat CG4 Lactic Acid, ED     Status: Abnormal   Collection Time: 03/23/17  9:13 PM  Result Value Ref Range   Lactic Acid, Venous 4.40 (HH) 0.5 - 1.9 mmol/L   Comment NOTIFIED PHYSICIAN   Urinalysis, Routine w reflex microscopic     Status: Abnormal   Collection Time: 03/23/17  9:41 PM  Result Value Ref Range   Color, Urine STRAW (A) YELLOW   APPearance CLEAR CLEAR   Specific Gravity, Urine >1.046 (H) 1.005 - 1.030   pH 6.0 5.0 - 8.0   Glucose, UA NEGATIVE NEGATIVE mg/dL   Hgb urine dipstick SMALL (A) NEGATIVE   Bilirubin Urine NEGATIVE NEGATIVE   Ketones, ur NEGATIVE NEGATIVE mg/dL    Protein, ur NEGATIVE NEGATIVE mg/dL   Nitrite NEGATIVE NEGATIVE   Leukocytes, UA NEGATIVE NEGATIVE   RBC / HPF 0-5 0 - 5 RBC/hpf   WBC, UA 0-5 0 - 5 WBC/hpf   Bacteria, UA NONE SEEN NONE SEEN   Squamous Epithelial / LPF 0-5 (A) NONE SEEN  Brain natriuretic peptide  Status: None   Collection Time: 03/24/17  2:09 AM  Result Value Ref Range   B Natriuretic Peptide 21.9 0.0 - 100.0 pg/mL  Basic metabolic panel     Status: Abnormal   Collection Time: 03/24/17  4:39 AM  Result Value Ref Range   Sodium 141 135 - 145 mmol/L   Potassium 3.6 3.5 - 5.1 mmol/L   Chloride 110 101 - 111 mmol/L   CO2 17 (L) 22 - 32 mmol/L   Glucose, Bld 212 (H) 65 - 99 mg/dL   BUN 7 6 - 20 mg/dL   Creatinine, Ser 0.79 0.44 - 1.00 mg/dL   Calcium 7.7 (L) 8.9 - 10.3 mg/dL   GFR calc non Af Amer >60 >60 mL/min   GFR calc Af Amer >60 >60 mL/min    Comment: (NOTE) The eGFR has been calculated using the CKD EPI equation. This calculation has not been validated in all clinical situations. eGFR's persistently <60 mL/min signify possible Chronic Kidney Disease.    Anion gap 14 5 - 15  CBC     Status: Abnormal   Collection Time: 03/24/17  4:39 AM  Result Value Ref Range   WBC 9.3 4.0 - 10.5 K/uL   RBC 3.96 3.87 - 5.11 MIL/uL   Hemoglobin 11.5 (L) 12.0 - 15.0 g/dL   HCT 35.0 (L) 36.0 - 46.0 %   MCV 88.4 78.0 - 100.0 fL   MCH 29.0 26.0 - 34.0 pg   MCHC 32.9 30.0 - 36.0 g/dL   RDW 14.1 11.5 - 15.5 %   Platelets 320 150 - 400 K/uL  MRSA PCR Screening     Status: Abnormal   Collection Time: 03/24/17  6:27 AM  Result Value Ref Range   MRSA by PCR POSITIVE (A) NEGATIVE    Comment:        The GeneXpert MRSA Assay (FDA approved for NASAL specimens only), is one component of a comprehensive MRSA colonization surveillance program. It is not intended to diagnose MRSA infection nor to guide or monitor treatment for MRSA infections. RESULT CALLED TO, READ BACK BY AND VERIFIED WITH: S.GRAHAM RN [phone] [id number]  A.QUIZON   Lactic acid, plasma     Status: Abnormal   Collection Time: 03/24/17  7:59 AM  Result Value Ref Range   Lactic Acid, Venous 5.1 (HH) 0.5 - 1.9 mmol/L    Comment: CRITICAL RESULT CALLED TO, READ BACK BY AND VERIFIED WITH: C.SMITH RN [phone] [id number] A.QUIZON   Creatinine, serum     Status: None   Collection Time: 03/25/17  3:25 AM  Result Value Ref Range   Creatinine, Ser 0.72 0.44 - 1.00 mg/dL   GFR calc non Af Amer >60 >60 mL/min   GFR calc Af Amer >60 >60 mL/min    Comment: (NOTE) The eGFR has been calculated using the CKD EPI equation. This calculation has not been validated in all clinical situations. eGFR's persistently <60 mL/min signify possible Chronic Kidney Disease.    Ct Abdomen Pelvis W Contrast  Result Date: 03/23/2017 CLINICAL DATA:  46 y/o F; history of anal cancer. Draining protrusion of right lower abdominal fall. EXAM: CT ABDOMEN AND PELVIS WITH CONTRAST TECHNIQUE: Multidetector CT imaging of the abdomen and pelvis was performed using the standard protocol following bolus administration of intravenous contrast. CONTRAST:  15m ISOVUE-300 IOPAMIDOL (ISOVUE-300) INJECTION 61% COMPARISON:  04/11/2012 CT abdomen and pelvis. FINDINGS: Lower chest: Right lower lobe platelike atelectasis. Hepatobiliary: Hepatic steatosis. No focal liver abnormality is seen. No gallstones, gallbladder wall  thickening, or biliary dilatation. Pancreas: Unremarkable. No pancreatic ductal dilatation or surrounding inflammatory changes. Spleen: Normal in size without focal abnormality. Adrenals/Urinary Tract: Adrenal glands are unremarkable. Kidneys are normal, without renal calculi, focal lesion, or hydronephrosis. Bladder is unremarkable. Stomach/Bowel: Stomach is within normal limits. Appendix appears normal. No evidence of bowel wall thickening, distention, or inflammatory changes. Mild sigmoid diverticulosis. Vascular/Lymphatic: No significant vascular findings are present. Stable prominent  right external iliac lymph nodes, less than 1 cm short axis. Reproductive: Simple right adnexal cyst measuring 23 mm. Hysterectomy. Other: Right lower abdominal wall 19 mm dermal lesion probably representing a cyst or abscess (series 2, image 82). Mild surrounding inflammatory changes in subcutaneous fat. Musculoskeletal: No acute or significant osseous findings. IMPRESSION: 1. Right lower abdominal wall 19 mm dermal lesion, likely representing a sister abscess with mild surrounding inflammatory changes. 2. Hepatic steatosis. 3. Mild sigmoid diverticulosis. Electronically Signed   By: Kristine Garbe M.D.   On: 03/23/2017 21:23   Dg Chest Port 1 View  Result Date: 03/24/2017 CLINICAL DATA:  Respiratory distress EXAM: PORTABLE CHEST 1 VIEW COMPARISON:  Chest radiograph 03/23/2017 at 18:51 FINDINGS: Mild cardiomegaly. No focal airspace consolidation or pulmonary edema. No pneumothorax or pleural effusion. Shallow lung inflation. IMPRESSION: Shallow lung inflation and cardiomegaly without focal airspace disease. Electronically Signed   By: Ulyses Jarred M.D.   On: 03/24/2017 00:43   Dg Chest Portable 1 View  Result Date: 03/23/2017 CLINICAL DATA:  46 y/o  F; shortness of breath. EXAM: PORTABLE CHEST 1 VIEW COMPARISON:  None. FINDINGS: Stable heart size and mediastinal contours are within normal limits. Pulmonary venous hypertension. No consolidation, effusion, or pneumothorax. The visualized skeletal structures are unremarkable. IMPRESSION: Stable cardiac silhouette. Pulmonary venous hypertension. No consolidation. Electronically Signed   By: Kristine Garbe M.D.   On: 03/23/2017 19:16   Assessment/Plan COPD Asthma HM2 Obestiy Anxiety/depression  Abdominal wall abscess - S/P bedside I&D 11/8 - minimal purulent and sanguinous drainage encountered. Procedure note to follow. - continue vanc/zosyn - will change dressing tomorrow; spoke with pt about local wound care, she and her family  feel comfortable performing dressing changes on their own and do not desire HH. - should be stable for discharge tomorrow from surgical perspective, d/c on 7 days bactrim or doxy. F/U in our office in 1-2 weeks for a wound check.  - patient wishes to return to work early next week, this is fine from a surgical perspective as long as she can keep the wound clean and dry. Shower daily.    Jill Alexanders, [facility] 03/25/2017, 1:14 PM Pager: [phone] Consults: [phone] Mon-Fri 7:00 am-4:30 pm Sat-Sun 7:00 am-11:30 am

## 2017-03-25 NOTE — Discharge Instructions (Signed)
Abdominal wound care: - shower daily with mild soap and water; try to keep skin folds clean and dry during the day - change dressing twice daily. - remove all packing. Dampen a 2x2 gauze and loosely pack the wound to cover the pink/red raw tissue. Cover with dry gauze and tape.  - change dressing as needing if it gets dirty  Follow up in [place] office in one week.

## 2017-03-25 NOTE — Progress Notes (Signed)
PROGRESS NOTE    Denise Ray  [id number] DOB: 1970-12-12 DOA: 03/23/2017 PCP: [name], MD    Brief Narrative: Denise Ray is a 46 y.o. female w/ hx of COPD/ asthma, hx PNA, smoker, obesity presented to ED w/ 2 day history of RLQ pain. She was found to have right abdominal wall abscess , underwent I&D in ED. Admitted to step down for acute copd exacerbation, and right abd wall abscess.    Assessment & Plan:   Principal Problem:   Soft tissue abscess- abd wall RLQ Active Problems:   Asthma with acute exacerbation   Obesity, unspecified   Respiratory distress   Acute respiratory failure (HCC)   Diabetes (HCC)   Smoker   Anxiety and depression   Chronic pain   Right sided abdominal wall abscess:  S/p I&D in ED, pt reports persistent pain in the area.  Requested surgery consult to see if she needs debridement.     Acute respiratory failure with hypoxia secondary to copd exacerbation:  Wheezing has improved and plan to wean her the IV steroids to po prednisone.  Resume bronchodilators.    Diabetes mellitus:  CBG (last 3)  No results for input(s): GLUCAP in the last 72 hours.  Holding metformin.    Anxiety and depression: resume home meds.    Obesity Weight loss and calorie counting.    OSA"  Resume BIPAP at night.      DVT prophylaxis: scd's Code Status: (full code.  Family Communication:  Family at bedside today  Disposition Plan: pending surgery consultation  Consultants:   Surgery.   Procedures:Incision and Drainage.   Antimicrobials:vancomycin and zosyn.   Subjective: Persistent pain int he abd wall.   Objective: Vitals:   03/25/17 0500 03/25/17 0600 03/25/17 0746 03/25/17 0800  BP:      Pulse: 73 70    Resp: 16 16    Temp:    97.7 F (36.5 C)  TempSrc:    Oral  SpO2: 97% 98% 96%   Weight:      Height:        Intake/Output Summary (Last 24 hours) at 03/25/2017 1123 Last data filed at 03/25/2017 0600 Gross per 24  hour  Intake 1200 ml  Output 1100 ml  Net 100 ml   Filed Weights   03/23/17 2258 03/24/17 0600  Weight: 117.9 kg (260 lb) 120.4 kg (265 lb 6.9 oz)    Examination:  General exam: Appears calm and comfortable  Respiratory system: Clear to auscultation. Respiratory effort normal. Cardiovascular system: S1 & S2 heard, RRR. No JVD, murmurs, rubs, gallops or clicks. No pedal edema. Gastrointestinal system: Abdomen is nondistended, soft and nontender. No organomegaly or masses felt. Normal bowel sounds heard. abdwall abscess draining purulent material, about 1 cm , with improved erythema.  Central nervous system: Alert and oriented. No focal neurological deficits. Extremities: Symmetric 5 x 5 power. Skin: No rashes, lesions or ulcers Psychiatry: Judgement and insight appear normal. Mood & affect appropriate.     Data Reviewed: I have personally reviewed following labs and imaging studies  CBC: Recent Labs  Lab 03/23/17 1834 03/23/17 1936 03/24/17 0439  WBC 12.3*  --  9.3  HGB 13.8 12.9 11.5*  HCT 40.8 38.0 35.0*  MCV 87.7  --  88.4  PLT 394  --  401   Basic Metabolic Panel: Recent Labs  Lab 03/23/17 1834 03/23/17 1936 03/24/17 0439 03/25/17 0325  NA 137 141 141  --   K 3.5 3.0*  3.6  --   CL 102 104 110  --   CO2 26  --  17*  --   GLUCOSE 117* 152* 212*  --   BUN 7 7 7   --   CREATININE 0.80 0.70 0.79 0.72  CALCIUM 8.8*  --  7.7*  --    GFR: Estimated Creatinine Clearance: 114.8 mL/min (by C-G formula based on SCr of 0.72 mg/dL). Liver Function Tests: Recent Labs  Lab 03/23/17 1834  AST 22  ALT 17  ALKPHOS 72  BILITOT 0.4  PROT 7.0  ALBUMIN 3.7   Recent Labs  Lab 03/23/17 1834  LIPASE 27   No results for input(s): AMMONIA in the last 168 hours. Coagulation Profile: Recent Labs  Lab 03/23/17 1834  INR 0.89   Cardiac Enzymes: No results for input(s): CKTOTAL, CKMB, CKMBINDEX, TROPONINI in the last 168 hours. BNP (last 3 results) No results for  input(s): PROBNP in the last 8760 hours. HbA1C: Recent Labs    03/23/17 1834  HGBA1C 6.0*   CBG: No results for input(s): GLUCAP in the last 168 hours. Lipid Profile: No results for input(s): CHOL, HDL, LDLCALC, TRIG, CHOLHDL, LDLDIRECT in the last 72 hours. Thyroid Function Tests: No results for input(s): TSH, T4TOTAL, FREET4, T3FREE, THYROIDAB in the last 72 hours. Anemia Panel: No results for input(s): VITAMINB12, FOLATE, FERRITIN, TIBC, IRON, RETICCTPCT in the last 72 hours. Sepsis Labs: Recent Labs  Lab 03/23/17 1937 03/23/17 2113 03/24/17 0759  LATICACIDVEN 3.45* 4.40* 5.1*    Recent Results (from the past 240 hour(s))  Blood culture (routine x 2)     Status: None (Preliminary result)   Collection Time: 03/23/17  6:51 PM  Result Value Ref Range Status   Specimen Description BLOOD RIGHT FOREARM  Final   Special Requests   Final    IN PEDIATRIC BOTTLE Blood Culture results may not be optimal due to an excessive volume of blood received in culture bottles   Culture   Final    NO GROWTH 2 DAYS Performed at [facility], [place] [address], [place], [place] [address]    Report Status PENDING  Incomplete  Blood culture (routine x 2)     Status: None (Preliminary result)   Collection Time: 03/23/17  6:56 PM  Result Value Ref Range Status   Specimen Description BLOOD LEFT FOREARM  Final   Special Requests   Final    BOTTLES DRAWN AEROBIC AND ANAEROBIC Blood Culture results may not be optimal due to an inadequate volume of blood received in culture bottles   Culture   Final    NO GROWTH 2 DAYS Performed at [facility], [place] [address] [place], [place] [address]    Report Status PENDING  Incomplete  MRSA PCR Screening     Status: Abnormal   Collection Time: 03/24/17  6:27 AM  Result Value Ref Range Status   MRSA by PCR POSITIVE (A) NEGATIVE Final    Comment:        The GeneXpert MRSA Assay (FDA approved for NASAL specimens only), is one component of  a comprehensive MRSA colonization surveillance program. It is not intended to diagnose MRSA infection nor to guide or monitor treatment for MRSA infections. RESULT CALLED TO, READ BACK BY AND VERIFIED WITH: [facility] RN [id number] [email or web address]          Radiology Studies: Ct Abdomen Pelvis W Contrast  Result Date: 03/23/2017 CLINICAL DATA:  46 y/o F; history of anal cancer. Draining protrusion  of right lower abdominal fall. EXAM: CT ABDOMEN AND PELVIS WITH CONTRAST TECHNIQUE: Multidetector CT imaging of the abdomen and pelvis was performed using the standard protocol following bolus administration of intravenous contrast. CONTRAST:  167mL ISOVUE-300 IOPAMIDOL (ISOVUE-300) INJECTION 61% COMPARISON:  04/11/2012 CT abdomen and pelvis. FINDINGS: Lower chest: Right lower lobe platelike atelectasis. Hepatobiliary: Hepatic steatosis. No focal liver abnormality is seen. No gallstones, gallbladder wall thickening, or biliary dilatation. Pancreas: Unremarkable. No pancreatic ductal dilatation or surrounding inflammatory changes. Spleen: Normal in size without focal abnormality. Adrenals/Urinary Tract: Adrenal glands are unremarkable. Kidneys are normal, without renal calculi, focal lesion, or hydronephrosis. Bladder is unremarkable. Stomach/Bowel: Stomach is within normal limits. Appendix appears normal. No evidence of bowel wall thickening, distention, or inflammatory changes. Mild sigmoid diverticulosis. Vascular/Lymphatic: No significant vascular findings are present. Stable prominent right external iliac lymph nodes, less than 1 cm short axis. Reproductive: Simple right adnexal cyst measuring 23 mm. Hysterectomy. Other: Right lower abdominal wall 19 mm dermal lesion probably representing a cyst or abscess (series 2, image 82). Mild surrounding inflammatory changes in subcutaneous fat. Musculoskeletal: No acute or significant osseous findings. IMPRESSION: 1. Right lower abdominal wall 19 mm dermal lesion,  likely representing a sister abscess with mild surrounding inflammatory changes. 2. Hepatic steatosis. 3. Mild sigmoid diverticulosis. Electronically Signed   By: Kristine Garbe M.D.   On: 03/23/2017 21:23   Dg Chest Port 1 View  Result Date: 03/24/2017 CLINICAL DATA:  Respiratory distress EXAM: PORTABLE CHEST 1 VIEW COMPARISON:  Chest radiograph 03/23/2017 at 18:51 FINDINGS: Mild cardiomegaly. No focal airspace consolidation or pulmonary edema. No pneumothorax or pleural effusion. Shallow lung inflation. IMPRESSION: Shallow lung inflation and cardiomegaly without focal airspace disease. Electronically Signed   By: Ulyses Jarred M.D.   On: 03/24/2017 00:43   Dg Chest Portable 1 View  Result Date: 03/23/2017 CLINICAL DATA:  46 y/o  F; shortness of breath. EXAM: PORTABLE CHEST 1 VIEW COMPARISON:  None. FINDINGS: Stable heart size and mediastinal contours are within normal limits. Pulmonary venous hypertension. No consolidation, effusion, or pneumothorax. The visualized skeletal structures are unremarkable. IMPRESSION: Stable cardiac silhouette. Pulmonary venous hypertension. No consolidation. Electronically Signed   By: Kristine Garbe M.D.   On: 03/23/2017 19:16        Scheduled Meds: . albuterol  2.5 mg Nebulization Q6H  . ALPRAZolam  1 mg Oral BID  . chlorhexidine  15 mL Mouth Rinse BID  . Chlorhexidine Gluconate Cloth  6 each Topical Q0600  . enoxaparin (LOVENOX) injection  60 mg Subcutaneous Q24H  . loratadine  10 mg Oral Daily  . mouth rinse  15 mL Mouth Rinse q12n4p  . methylPREDNISolone (SOLU-MEDROL) injection  40 mg Intravenous Q12H  . mometasone-formoterol  2 puff Inhalation BID  . mupirocin ointment  1 application Nasal BID  . pantoprazole  20 mg Oral BID  . Tdap  0.5 mL Intramuscular Once  . tiotropium  18 mcg Inhalation q morning - 10a  . venlafaxine XR  150 mg Oral QHS   Continuous Infusions: . 0.45 % NaCl with KCl 20 mEq / L 50 mL/hr at 03/25/17 0635    . piperacillin-tazobactam (ZOSYN)  IV 3.375 g (03/25/17 0443)  . vancomycin Stopped (03/25/17 0053)     LOS: 0 days    Time spent: 61 minutes    Kalsey Lull, MD [facility] Pager [phone]  If 7PM-7AM, please contact night-coverage [email or web address] Password [facility] 03/25/2017, 11:23 AM

## 2017-03-25 NOTE — Progress Notes (Signed)
At 2354, pt placed on bipap for sleep per her request.  RN made aware.  No increased wob or respiratory distress noted or voiced by pt at that time.  Pt normally wears autocpap at home qhs.  RT will monitor and assess as needed.

## 2017-03-26 DIAGNOSIS — E119 Type 2 diabetes mellitus without complications: Secondary | ICD-10-CM

## 2017-03-26 DIAGNOSIS — G8929 Other chronic pain: Secondary | ICD-10-CM

## 2017-03-26 LAB — CREATININE, SERUM
Creatinine, Ser: 0.69 mg/dL (ref 0.44–1.00)
GFR calc Af Amer: >60 mL/min (ref >60)

## 2017-03-26 LAB — GLUCOSE, CAPILLARY
GLUCOSE-CAPILLARY: 284 mg/dL — AB (ref 65–99)
Glucose-Capillary: 203 mg/dL — ABNORMAL HIGH (ref 65–99)

## 2017-03-26 LAB — LACTIC ACID, PLASMA: Lactic Acid, Venous: 3.9 mmol/L (ref 0.5–1.9)

## 2017-03-26 MED ORDER — SULFAMETHOXAZOLE-TRIMETHOPRIM 800-160 MG PO TABS: 1.0000 | ORAL_TABLET | Freq: Two times a day (BID) | ORAL | 0 refills | Status: DC

## 2017-03-26 MED ORDER — MUPIROCIN 2 % EX OINT: 1.0000 "application " | TOPICAL_OINTMENT | Freq: Two times a day (BID) | CUTANEOUS | 0 refills | Status: DC

## 2017-03-26 MED ORDER — PREDNISONE 10 MG PO TABS: 40.0000 mg | ORAL_TABLET | Freq: Every day | ORAL | 0 refills | Status: AC

## 2017-03-26 MED FILL — predniSONE 10 MG TABS: 10 | 3 days supply | Qty: 12 | Fill #0

## 2017-03-26 MED FILL — SULFAMETHOXAZOLE-TMP DS TAB: 800-160 | 7 days supply | Qty: 14 | Fill #0

## 2017-03-26 MED FILL — MUPIROCIN 2% OINTMENT: 2 | 10 days supply | Qty: 22 | Fill #0

## 2017-03-26 NOTE — Plan of Care (Signed)
  Education: Knowledge of General Education information will improve 03/26/2017 1428 - Progressing by Dorene Sorrow, RN   Safety: Ability to remain free from injury will improve 03/26/2017 1428 - Progressing by Dorene Sorrow, RN   Skin Integrity: Risk for impaired skin integrity will decrease 03/26/2017 1428 - Progressing by Dorene Sorrow, RN   Pain Managment: General experience of comfort will improve 03/26/2017 1428 - Progressing by Dorene Sorrow, RN

## 2017-03-26 NOTE — Final Consult Note (Signed)
[facility] Progress Note     Subjective: CC: pain  Patient with mild pain at site of abdominal abscess. No fever, chills. Family member at bedside and participated in wound care teaching.   Objective: Vital signs in last 24 hours: Temp:  [97.9 F (36.6 C)-98.2 F (36.8 C)] 98 F (36.7 C) (11/09 0538) Pulse Rate:  [76-99] 79 (11/09 0538) Resp:  [14-16] 16 (11/09 0538) BP: (117-157)/(65-90) 152/75 (11/09 0538) SpO2:  [95 %-100 %] 97 % (11/09 0904) Last BM Date: 03/25/17(Per pt report)  Intake/Output from previous day: 11/08 0701 - 11/09 0700 In: 1850 [P.O.:800; I.V.:400; IV Piggyback:650] Out: 6440 [Urine:1650] Intake/Output this shift: Total I/O In: 240 [P.O.:240] Out: 300 [Urine:300]  PE: Gen:  Alert, NAD, pleasant Pulm:  Normal effort Abd: Soft, mildly TTP around RLQ where skin wound is, non-distended Skin: warm and dry, no rashes; wound is clean with minimal purulent drainage on removed packing, mild surrounding erythema, no induration Psych: A&Ox3   Lab Results:  Recent Labs    03/23/17 1834 03/23/17 1936 03/24/17 0439  WBC 12.3*  --  9.3  HGB 13.8 12.9 11.5*  HCT 40.8 38.0 35.0*  PLT 394  --  320   BMET Recent Labs    03/23/17 1834 03/23/17 1936 03/24/17 0439 03/25/17 0325 03/26/17 0441  NA 137 141 141  --   --   K 3.5 3.0* 3.6  --   --   CL 102 104 110  --   --   CO2 26  --  17*  --   --   GLUCOSE 117* 152* 212*  --   --   BUN 7 7 7   --   --   CREATININE 0.80 0.70 0.79 0.72 0.69  CALCIUM 8.8*  --  7.7*  --   --    PT/INR Recent Labs    03/23/17 1834  LABPROT 12.0  INR 0.89   CMP     Component Value Date/Time   NA 141 03/24/2017 0439   NA 140 01/06/2013 1314   K 3.6 03/24/2017 0439   CL 110 03/24/2017 0439   CO2 17 (L) 03/24/2017 0439   GLUCOSE 212 (H) 03/24/2017 0439   BUN 7 03/24/2017 0439   BUN 7 01/06/2013 1314   CREATININE 0.69 03/26/2017 0441   CALCIUM 7.7 (L) 03/24/2017 0439   PROT 7.0 03/23/2017 1834   PROT  6.9 01/06/2013 1314   ALBUMIN 3.7 03/23/2017 1834   ALBUMIN 4.0 01/06/2013 1314   AST 22 03/23/2017 1834   ALT 17 03/23/2017 1834   ALKPHOS 72 03/23/2017 1834   BILITOT 0.4 03/23/2017 1834   GFRNONAA >60 03/26/2017 0441   GFRAA >60 03/26/2017 0441   Lipase     Component Value Date/Time   LIPASE 27 03/23/2017 1834       Studies/Results: No results found.  Anti-infectives: Anti-infectives (From admission, onward)   Start     Dose/Rate Route Frequency Ordered Stop   03/24/17 1200  vancomycin (VANCOCIN) IVPB 1000 mg/200 mL premix     1,000 mg 200 mL/hr over 60 Minutes Intravenous Every 12 hours 03/24/17 0325     03/24/17 0400  piperacillin-tazobactam (ZOSYN) IVPB 3.375 g     3.375 g 12.5 mL/hr over 240 Minutes Intravenous Every 8 hours 03/24/17 0325     03/24/17 0000  vancomycin (VANCOCIN) 2,000 mg in sodium chloride 0.9 % 500 mL IVPB     2,000 mg 250 mL/hr over 120 Minutes Intravenous  Once 03/23/17 2350  03/24/17 0253   03/23/17 2145  piperacillin-tazobactam (ZOSYN) IVPB 3.375 g     3.375 g 100 mL/hr over 30 Minutes Intravenous  Once 03/23/17 2141 03/23/17 2250   03/23/17 2145  vancomycin (VANCOCIN) IVPB 1000 mg/200 mL premix     1,000 mg 200 mL/hr over 60 Minutes Intravenous  Once 03/23/17 2141 03/24/17 0202       Assessment/Plan COPD Asthma HM2 Obestiy Anxiety/depression  Abdominal wall abscess - S/P bedside I&D 11/8 - minimal purulent and sanguinous drainage encountered.  - continue vanc/zosyn - wound is clean; discussed wound care with patient and family member, she and her family feel comfortable performing dressing changes on their own and do not desire HH. - stable for discharge from surgical perspective, d/c on 7 days bactrim or doxy. F/U in our office in 1-2 weeks for a wound check.  - patient wishes to return to work early next week, this is fine from a surgical perspective as long as she can keep the wound clean and dry. Shower daily.     LOS: 1 day     Brigid Re , [facility] 03/26/2017, 10:42 AM Pager: [phone] Consults: [phone] Mon-Fri 7:00 am-4:30 pm Sat-Sun 7:00 am-11:30 am

## 2017-03-26 NOTE — Progress Notes (Signed)
CRITICAL VALUE ALERT  Critical Value:  Lactic Acid 3.9  Date & Time Notied:  03/26/17  1602  Provider Notified: Dr Karleen Hampshire  Orders Received/Actions taken: MD to call patient

## 2017-03-26 NOTE — Progress Notes (Signed)
03/26/17  1445 Reviewed discharge instructions with patient. Patient verbalized understanding of discharge instructions. Copy of discharge instructions and prescriptions given to patient.

## 2017-03-28 LAB — CULTURE, BLOOD (ROUTINE X 2)
CULTURE: NO GROWTH
Culture: NO GROWTH

## 2017-03-29 ENCOUNTER — Other Ambulatory Visit: Payer: Self-pay

## 2017-03-29 MED FILL — HYDROCODON-APAP 10-325: 10-325 | 30 days supply | Qty: 120 | Fill #0

## 2017-03-29 NOTE — Patient Outreach (Signed)
[facility]) Care Management  03/29/2017  TEGAN BRITAIN 11-20-1970 [id number]   Transition of care phone call.  Member was hospitalized on 03/23/17 with soft tissue abscess of abd wall RLQ and discharged on 03/26/17. Subjective:  States that she is doing her dressing changes twice a day as ordered.  States.  That her family can help her if needed.  States she is to go back to see surgery on 04/01/17.  States her breathing has improved and she is completing her prednisone. States she is planning to go back to work on Thursday. Transition of care call completed Instructed on s/s to call MD Instructed to keep appt on 04/01/17 with surgery and to call primary care provider to schedule follow up appt. Instructed that low cost dressing supplies can be obtained at the [employer] office Patient was recently discharged from hospital and all medications have been reviewed. Plan to call 04/12/17 to follow up. Peter Garter RN, [facility] Care Management Coordinator-Link to [place] Management [phone]

## 2017-03-29 NOTE — Discharge Summary (Signed)
Physician Discharge Summary  Denise Ray [id number] DOB: [birth year] DOA: 03/23/2017  PCP: [name], MD  Admit date: 03/23/2017 Discharge date: 03/26/2017  Admitted From:Home.  Disposition:  Home.   Recommendations for Outpatient Follow-up:  1. Follow up with PCP in 1-2 weeks 2. Please obtain BMP/CBC in one week Please follow up with surgery as recommended.   Discharge Condition:stable.  CODE STATUS: full code.  Diet recommendation: Heart Healthy  Brief/Interim Summary: Denise Ray a 46 y.o.femalew/ hx of COPD/ asthma, hx PNA, smoker, obesity presented to ED w/ 2 day history of RLQ pain. She was found to have right abdominal wall abscess , underwent I&D in ED. Admitted to step down for acute copd exacerbation, and right abd wall abscess.     Discharge Diagnoses:  Principal Problem:   Soft tissue abscess- abd wall RLQ Active Problems:   Asthma with acute exacerbation   Obesity, unspecified   Respiratory distress   Acute respiratory failure (HCC)   Diabetes (HCC)   Smoker   Anxiety and depression   Chronic pain  Right sided abdominal wall abscess:  S/p I&D in ED, pt reports persistent pain in the area.  Requested surgery consult and she underwent bedside incision and drainage . Her pain has improved and she was discharged on oral antibiotics to complete the course.     Acute respiratory failure with hypoxia secondary to copd exacerbation:  Wheezing has improved and  Weaned  the IV steroids to po prednisone.  Resume bronchodilators.    Diabetes mellitus:  CBG (last 3)  RecentLabs(last2labs)  No results for input(s): GLUCAP in the last 72 hours.    Holding metformin while inpatient, recommended her to hold the metformin for a few days as her lactic acid remained slightly high.    Anxiety and depression: resume home meds.    Obesity Weight loss and calorie counting.    OSA"  Resume BIPAP at night.      Discharge  Instructions  Discharge Instructions    Diet - low sodium heart healthy   Complete by:  As directed    Discharge instructions   Complete by:  As directed    Follow up with surgery as recommended.     Allergies as of 03/26/2017      Reactions   Tamiflu Other (See Comments), Cough   BRONCHOCONSTRICTION   Estrogens Other (See Comments)   SYMPTOMS OF A STROKE   Wellbutrin [bupropion] Other (See Comments)   CAUSED TIA WHEN TAKEN WITH ESTROGEN   Ibuprofen Other (See Comments)   Avoids due to nosebleeds   Morphine And Related Other (See Comments)   DOES NOT RELIEVE PAIN      Medication List    TAKE these medications   albuterol (5 MG/ML) 0.5% nebulizer solution Commonly known as:  PROVENTIL Take 2.5 mg by nebulization every 6 (six) hours as needed for wheezing or shortness of breath.   albuterol 108 (90 Base) MCG/ACT inhaler Commonly known as:  PROVENTIL HFA;VENTOLIN HFA Inhale 2 puffs into the lungs every 6 (six) hours as needed. wheezing   ALPRAZolam 1 MG tablet Commonly known as:  XANAX Take 1 mg by mouth 2 (two) times daily.   budesonide-formoterol 160-4.5 MCG/ACT inhaler Commonly known as:  SYMBICORT Inhale 2 puffs into the lungs 2 (two) times daily.   famotidine 20 MG tablet Commonly known as:  PEPCID Take 20 mg by mouth at bedtime.   furosemide 40 MG tablet Commonly known as:  LASIX Take 40 mg  by mouth daily as needed.   guaiFENesin 600 MG 12 hr tablet Commonly known as:  MUCINEX Take 1 tablet (600 mg total) by mouth 2 (two) times daily. What changed:    when to take this  reasons to take this   HYDROcodone-acetaminophen 10-325 MG tablet Commonly known as:  NORCO Take 1 tablet by mouth every 6 (six) hours.   lidocaine 2 % jelly Commonly known as:  XYLOCAINE Apply 1 application every 6 (six) hours as needed topically.   loratadine 10 MG tablet Commonly known as:  CLARITIN Take 10 mg by mouth daily.   magic mouthwash w/lidocaine Soln Take 10 mLs 4  (four) times daily as needed by mouth for mouth pain (Thrush).   metFORMIN 500 MG 24 hr tablet Commonly known as:  GLUCOPHAGE XR Take 1 tablet (500 mg total) by mouth 2 (two) times daily.   mupirocin ointment 2 % Commonly known as:  BACTROBAN Place 1 application 2 (two) times daily into the nose.   pantoprazole 40 MG tablet Commonly known as:  PROTONIX Take 40 mg by mouth 2 (two) times daily. 1/2 tablet twice a day   polyethylene glycol packet Commonly known as:  MIRALAX / GLYCOLAX Take 17 g by mouth daily as needed for mild constipation.   predniSONE 10 MG tablet Commonly known as:  DELTASONE Take 4 tablets (40 mg total) daily with breakfast for 3 days by mouth.   sulfamethoxazole-trimethoprim 800-160 MG tablet Commonly known as:  BACTRIM DS,SEPTRA DS Take 1 tablet 2 (two) times daily by mouth.   tiotropium 18 MCG inhalation capsule Commonly known as:  SPIRIVA HANDIHALER Place 1 capsule (18 mcg total) into inhaler and inhale every morning.   venlafaxine XR 75 MG 24 hr capsule Commonly known as:  EFFEXOR-XR Take 150 mg at bedtime by mouth.      Follow-up [facility] Follow up on 04/01/2017.   Specialty:  General Surgery Why:  at 9:00 AM for wound check. please arrive 30 min early.  Contact information: [address] [place] [phone]       [name], MD. Schedule an appointment as soon as possible for a visit in 1 week(s).   Specialty:  Pulmonary Disease Contact information: [address] [place] [address] [phone]          Allergies  Allergen Reactions  . Tamiflu Other (See Comments) and Cough    BRONCHOCONSTRICTION  . Estrogens Other (See Comments)    SYMPTOMS OF A STROKE  . Wellbutrin [Bupropion] Other (See Comments)    CAUSED TIA WHEN TAKEN WITH ESTROGEN  . Ibuprofen Other (See Comments)    Avoids due to nosebleeds  . Morphine And  Related Other (See Comments)    DOES NOT RELIEVE PAIN    Consultations:  Surgery.    Procedures/Studies: Ct Abdomen Pelvis W Contrast  Result Date: 03/23/2017 CLINICAL DATA:  46 y/o F; history of anal cancer. Draining protrusion of right lower abdominal fall. EXAM: CT ABDOMEN AND PELVIS WITH CONTRAST TECHNIQUE: Multidetector CT imaging of the abdomen and pelvis was performed using the standard protocol following bolus administration of intravenous contrast. CONTRAST:  172mL ISOVUE-300 IOPAMIDOL (ISOVUE-300) INJECTION 61% COMPARISON:  04/11/2012 CT abdomen and pelvis. FINDINGS: Lower chest: Right lower lobe platelike atelectasis. Hepatobiliary: Hepatic steatosis. No focal liver abnormality is seen. No gallstones, gallbladder wall thickening, or biliary dilatation. Pancreas: Unremarkable. No pancreatic ductal dilatation or surrounding inflammatory changes.  Spleen: Normal in size without focal abnormality. Adrenals/Urinary Tract: Adrenal glands are unremarkable. Kidneys are normal, without renal calculi, focal lesion, or hydronephrosis. Bladder is unremarkable. Stomach/Bowel: Stomach is within normal limits. Appendix appears normal. No evidence of bowel wall thickening, distention, or inflammatory changes. Mild sigmoid diverticulosis. Vascular/Lymphatic: No significant vascular findings are present. Stable prominent right external iliac lymph nodes, less than 1 cm short axis. Reproductive: Simple right adnexal cyst measuring 23 mm. Hysterectomy. Other: Right lower abdominal wall 19 mm dermal lesion probably representing a cyst or abscess (series 2, image 82). Mild surrounding inflammatory changes in subcutaneous fat. Musculoskeletal: No acute or significant osseous findings. IMPRESSION: 1. Right lower abdominal wall 19 mm dermal lesion, likely representing a sister abscess with mild surrounding inflammatory changes. 2. Hepatic steatosis. 3. Mild sigmoid diverticulosis. Electronically Signed   By: Kristine Garbe M.D.   On: 03/23/2017 21:23   Dg Chest Port 1 View  Result Date: 03/24/2017 CLINICAL DATA:  Respiratory distress EXAM: PORTABLE CHEST 1 VIEW COMPARISON:  Chest radiograph 03/23/2017 at 18:51 FINDINGS: Mild cardiomegaly. No focal airspace consolidation or pulmonary edema. No pneumothorax or pleural effusion. Shallow lung inflation. IMPRESSION: Shallow lung inflation and cardiomegaly without focal airspace disease. Electronically Signed   By: Ulyses Jarred M.D.   On: 03/24/2017 00:43   Dg Chest Portable 1 View  Result Date: 03/23/2017 CLINICAL DATA:  46 y/o  F; shortness of breath. EXAM: PORTABLE CHEST 1 VIEW COMPARISON:  None. FINDINGS: Stable heart size and mediastinal contours are within normal limits. Pulmonary venous hypertension. No consolidation, effusion, or pneumothorax. The visualized skeletal structures are unremarkable. IMPRESSION: Stable cardiac silhouette. Pulmonary venous hypertension. No consolidation. Electronically Signed   By: Kristine Garbe M.D.   On: 03/23/2017 19:16    .    Subjective: No new complaints.   Discharge Exam: Vitals:   03/26/17 0904 03/26/17 1410  BP:  (!) 143/77  Pulse:  80  Resp:  18  Temp:  98.6 F (37 C)  SpO2: 97% 98%   Vitals:   03/26/17 0538 03/26/17 0549 03/26/17 0904 03/26/17 1410  BP: (!) 152/75   (!) 143/77  Pulse: 79   80  Resp: 16   18  Temp: 98 F (36.7 C)   98.6 F (37 C)  TempSrc: Oral   Oral  SpO2: 100% 100% 97% 98%  Weight:      Height:        General: Pt is alert, awake, not in acute distress Cardiovascular: RRR, S1/S2 +, no rubs, no gallops Respiratory: CTA bilaterally, no wheezing, no rhonchi Abdominal: Soft, NT, ND, bowel sounds + Extremities: no edema, no cyanosis    The results of significant diagnostics from this hospitalization (including imaging, microbiology, ancillary and laboratory) are listed below for reference.     Microbiology: Recent Results (from the past 240 hour(s))   Blood culture (routine x 2)     Status: None   Collection Time: 03/23/17  6:51 PM  Result Value Ref Range Status   Specimen Description BLOOD RIGHT FOREARM  Final   Special Requests   Final    IN PEDIATRIC BOTTLE Blood Culture results may not be optimal due to an excessive volume of blood received in culture bottles   Culture   Final    NO GROWTH 5 DAYS Performed at [facility], [place] [address] [place], [place] [address]    Report Status 03/28/2017 FINAL  Final  Blood culture (routine x 2)     Status:  None   Collection Time: 03/23/17  6:56 PM  Result Value Ref Range Status   Specimen Description BLOOD LEFT FOREARM  Final   Special Requests   Final    BOTTLES DRAWN AEROBIC AND ANAEROBIC Blood Culture results may not be optimal due to an inadequate volume of blood received in culture bottles   Culture   Final    NO GROWTH 5 DAYS Performed at [facility], [place] [address] [place], [place] [address]    Report Status 03/28/2017 FINAL  Final  MRSA PCR Screening     Status: Abnormal   Collection Time: 03/24/17  6:27 AM  Result Value Ref Range Status   MRSA by PCR POSITIVE (A) NEGATIVE Final    Comment:        The GeneXpert MRSA Assay (FDA approved for NASAL specimens only), is one component of a comprehensive MRSA colonization surveillance program. It is not intended to diagnose MRSA infection nor to guide or monitor treatment for MRSA infections. RESULT CALLED TO, READ BACK BY AND VERIFIED WITH: [facility] RN [phone] [id number] [email or web address]      Labs: BNP (last 3 results) Recent Labs    03/24/17 0209  BNP 39.7   Basic Metabolic Panel: Recent Labs  Lab 03/23/17 1834 03/23/17 1936 03/24/17 0439 03/25/17 0325 03/26/17 0441  NA 137 141 141  --   --   K 3.5 3.0* 3.6  --   --   CL 102 104 110  --   --   CO2 26  --  17*  --   --   GLUCOSE 117* 152* 212*  --   --   BUN 7 7 7   --   --   CREATININE 0.80 0.70 0.79 0.72 0.69  CALCIUM 8.8*  --  7.7*  --   --    Liver  Function Tests: Recent Labs  Lab 03/23/17 1834  AST 22  ALT 17  ALKPHOS 72  BILITOT 0.4  PROT 7.0  ALBUMIN 3.7   Recent Labs  Lab 03/23/17 1834  LIPASE 27   No results for input(s): AMMONIA in the last 168 hours. CBC: Recent Labs  Lab 03/23/17 1834 03/23/17 1936 03/24/17 0439  WBC 12.3*  --  9.3  HGB 13.8 12.9 11.5*  HCT 40.8 38.0 35.0*  MCV 87.7  --  88.4  PLT 394  --  320   Cardiac Enzymes: No results for input(s): CKTOTAL, CKMB, CKMBINDEX, TROPONINI in the last 168 hours. BNP: Invalid input(s): POCBNP CBG: Recent Labs  Lab 03/25/17 1644 03/25/17 2200 03/26/17 0809 03/26/17 1118  GLUCAP 202* 237* 203* 284*   D-Dimer No results for input(s): DDIMER in the last 72 hours. Hgb A1c No results for input(s): HGBA1C in the last 72 hours. Lipid Profile No results for input(s): CHOL, HDL, LDLCALC, TRIG, CHOLHDL, LDLDIRECT in the last 72 hours. Thyroid function studies No results for input(s): TSH, T4TOTAL, T3FREE, THYROIDAB in the last 72 hours.  Invalid input(s): FREET3 Anemia work up No results for input(s): VITAMINB12, FOLATE, FERRITIN, TIBC, IRON, RETICCTPCT in the last 72 hours. Urinalysis    Component Value Date/Time   COLORURINE STRAW (A) 03/23/2017 2141   APPEARANCEUR CLEAR 03/23/2017 2141   LABSPEC >1.046 (H) 03/23/2017 2141   PHURINE 6.0 03/23/2017 2141   GLUCOSEU NEGATIVE 03/23/2017 2141   HGBUR SMALL (A) 03/23/2017 2141   BILIRUBINUR NEGATIVE 03/23/2017 2141   BILIRUBINUR N 06/21/2015 [place] 03/23/2017 2141   PROTEINUR NEGATIVE 03/23/2017 2141  UROBILINOGEN negative 06/21/2015 1500   UROBILINOGEN 0.2 06/18/2014 1138   NITRITE NEGATIVE 03/23/2017 2141   LEUKOCYTESUR NEGATIVE 03/23/2017 2141   Sepsis Labs Invalid input(s): PROCALCITONIN,  WBC,  LACTICIDVEN Microbiology Recent Results (from the past 240 hour(s))  Blood culture (routine x 2)     Status: None   Collection Time: 03/23/17  6:51 PM  Result Value Ref Range  Status   Specimen Description BLOOD RIGHT FOREARM  Final   Special Requests   Final    IN PEDIATRIC BOTTLE Blood Culture results may not be optimal due to an excessive volume of blood received in culture bottles   Culture   Final    NO GROWTH 5 DAYS Performed at [facility], [place] [address] [place], [place] [address]    Report Status 03/28/2017 FINAL  Final  Blood culture (routine x 2)     Status: None   Collection Time: 03/23/17  6:56 PM  Result Value Ref Range Status   Specimen Description BLOOD LEFT FOREARM  Final   Special Requests   Final    BOTTLES DRAWN AEROBIC AND ANAEROBIC Blood Culture results may not be optimal due to an inadequate volume of blood received in culture bottles   Culture   Final    NO GROWTH 5 DAYS Performed at [facility], [place] [address], [place],  [address]    Report Status 03/28/2017 FINAL  Final  MRSA PCR Screening     Status: Abnormal   Collection Time: 03/24/17  6:27 AM  Result Value Ref Range Status   MRSA by PCR POSITIVE (A) NEGATIVE Final    Comment:        The GeneXpert MRSA Assay (FDA approved for NASAL specimens only), is one component of a comprehensive MRSA colonization surveillance program. It is not intended to diagnose MRSA infection nor to guide or monitor treatment for MRSA infections. RESULT CALLED TO, READ BACK BY AND VERIFIED WITH: [facility] RN [phone] [email or web address]      Time coordinating discharge: Over 30 minutes  SIGNED:   Hosie Poisson, MD  [facility] 03/29/2017, 8:12 AM Pager   If 7PM-7AM, please contact night-coverage [email or web address] Password [email or web address]

## 2017-04-05 ENCOUNTER — Ambulatory Visit: Payer: Self-pay

## 2017-04-05 DIAGNOSIS — G4733 Obstructive sleep apnea (adult) (pediatric): Secondary | ICD-10-CM | POA: Diagnosis not present

## 2017-04-05 DIAGNOSIS — J019 Acute sinusitis, unspecified: Secondary | ICD-10-CM | POA: Diagnosis not present

## 2017-04-05 DIAGNOSIS — L02211 Cutaneous abscess of abdominal wall: Secondary | ICD-10-CM | POA: Diagnosis not present

## 2017-04-05 DIAGNOSIS — J449 Chronic obstructive pulmonary disease, unspecified: Secondary | ICD-10-CM | POA: Diagnosis not present

## 2017-04-05 MED FILL — FLUTICASONE PROP 50 MCG SPR: 50 | 30 days supply | Qty: 16 | Fill #0

## 2017-04-05 MED FILL — AZITHROMYCIN 250 MG TAB: 250 | 5 days supply | Qty: 6 | Fill #0

## 2017-04-12 ENCOUNTER — Other Ambulatory Visit: Payer: Self-pay

## 2017-04-12 NOTE — Patient Outreach (Signed)
[place] [facility]) Care Management  04/12/2017  Denise Ray [birth year] [id number]   Telephone call to follow up on transition of care phone call.  Member was hospitalized on 03/23/17 with soft tissue abscess of abd wall RLQ and discharged on 03/26/17. No answer and message left. Plan to attempt outreach tomorrow 04/13/17 if call not returned. Peter Garter RN, [facility] Care Management Coordinator-Link to [place] Management [phone]

## 2017-04-13 ENCOUNTER — Other Ambulatory Visit: Payer: Self-pay

## 2017-04-13 NOTE — Patient Outreach (Signed)
[place] [facility]) Care Management  04/13/2017  LORIN HAUCK [birth year] [id number]   Telephone call to follow up on transition of care phone call. Member was hospitalized on 03/23/17 with soft tissue abscess of abd wall RLQ and discharged on 03/26/17. No answer and message left. Plan to attempt outreach tomorrow 04/14/17 if call not returned. Peter Garter RN, [facility] Care Management Coordinator-Link to [place] Management [phone]

## 2017-04-14 ENCOUNTER — Other Ambulatory Visit: Payer: Self-pay

## 2017-04-14 NOTE — Patient Outreach (Signed)
[place]) Care Management  04/14/2017  Denise Ray 1971/01/03 [id number]   Telephone call to follow up on transition of care phone call. Member was hospitalized on 03/23/17 with soft tissue abscess of abd wall RLQ and discharged on 03/26/17.  Member states that her wound is almost healed now.  States her breathing has good.  States she is to start at work with light duty after an injury last weekend.   Reviewed s/s to call MD and to keep her appointments with providers. Instructed that she will be transitioned to [employer] in 2019 for disease management and the Link to Wellness program will be closed. Instructed that she will be contacted by [employer] by phone in January 2019.   Instructed that he/she will continue to receive the pharmacy benefit.    [employer] will contact member in January to continue diabetes disease management. Case closed for Link to Wellness as member will be enrolled in an external program. Member and provider to be sent letter on transition to [place] RN, [facility] Care Management Coordinator-Link to [place] Management [phone]

## 2017-04-23 MED FILL — METFORMIN HCL ER 500 MG TAB: 500 | 30 days supply | Qty: 60 | Fill #6

## 2017-04-23 MED FILL — FREESTYLE LITE TEST STRIP: 50 days supply | Qty: 100 | Fill #3

## 2017-04-23 MED FILL — VENLAFAXINE HCL ER 75 MG CA: 75 | 90 days supply | Qty: 180 | Fill #1

## 2017-04-23 MED FILL — FREESTYLE LANCETS: 50 days supply | Qty: 100 | Fill #3

## 2017-04-27 MED FILL — HYDROCODON-APAP 10-325: 10-325 | 30 days supply | Qty: 120 | Fill #0

## 2017-05-06 MED FILL — AZITHROMYCIN 250 MG TAB: 250 | 5 days supply | Qty: 6 | Fill #1

## 2017-05-12 ENCOUNTER — Ambulatory Visit: Payer: Self-pay

## 2017-05-12 ENCOUNTER — Other Ambulatory Visit: Payer: Self-pay | Admitting: Occupational Medicine

## 2017-05-12 DIAGNOSIS — M545 Low back pain: Secondary | ICD-10-CM

## 2017-05-12 IMAGING — DX DG LUMBAR SPINE COMPLETE 4+V
5 series · 5 of 5 positions shown · non-contrast
Comparison: [DATE].

CLINICAL DATA: Low back pain.

EXAM:
LUMBAR SPINE - COMPLETE 4+ VIEW

[l-spine ap]
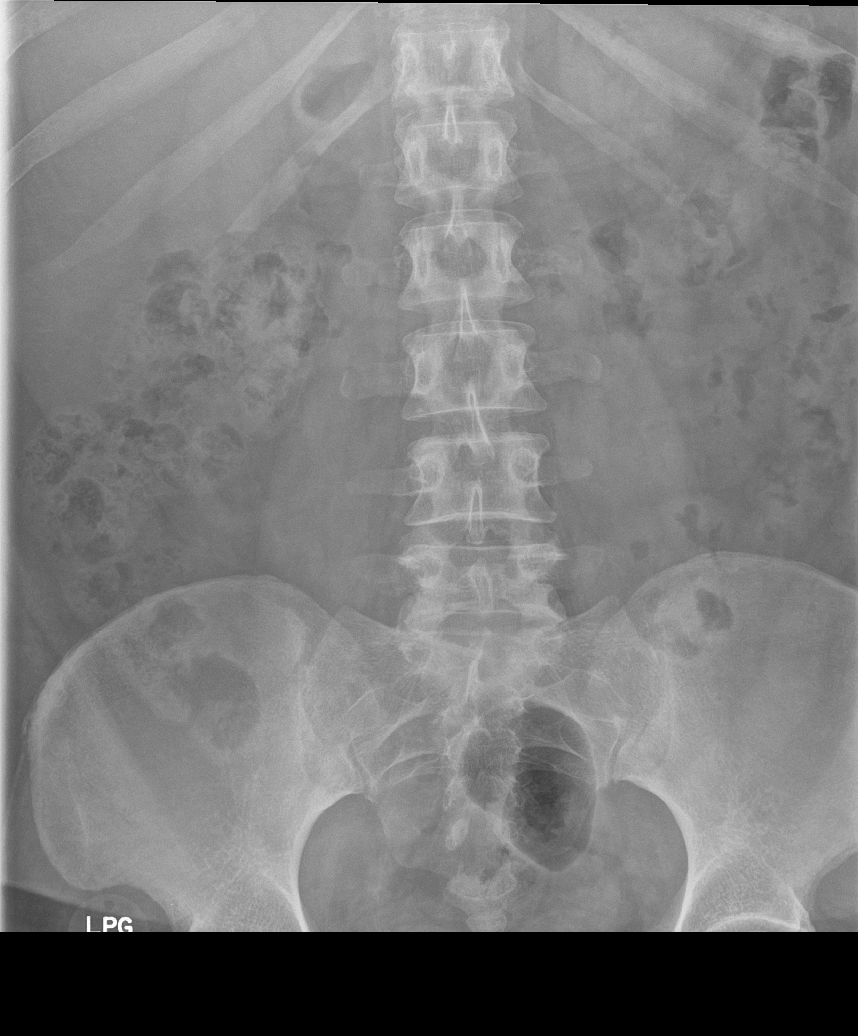

[l-spine obl (1 of 2)]
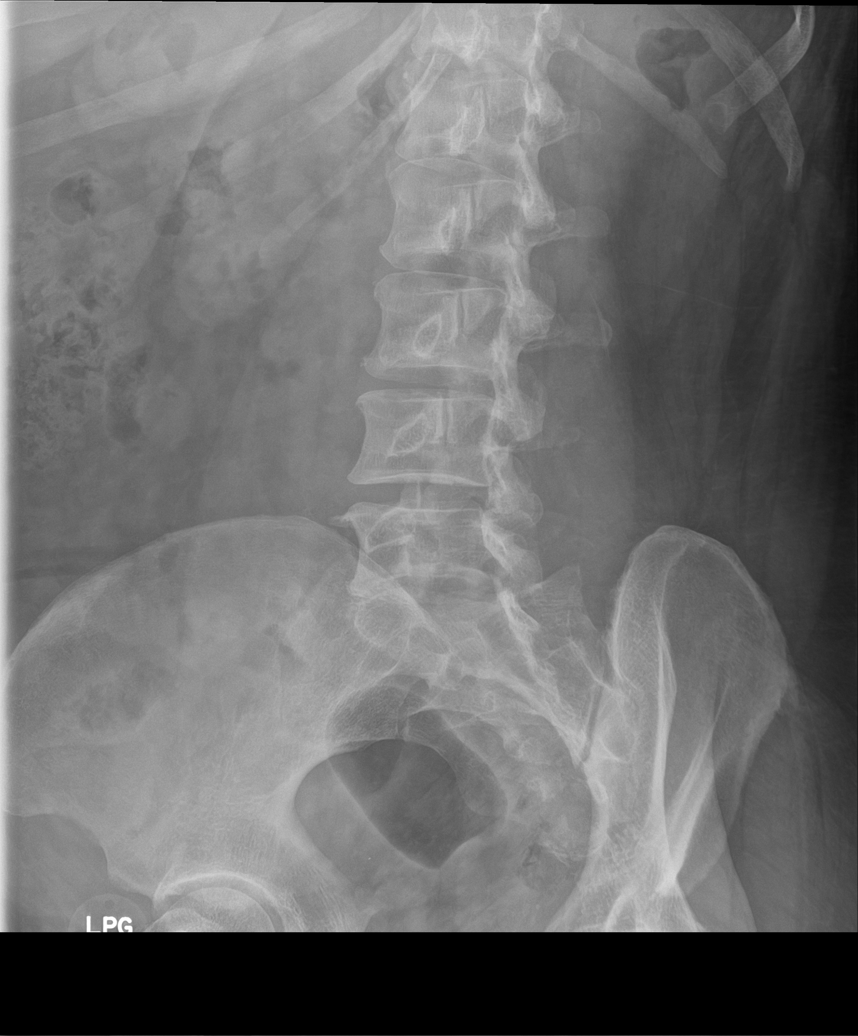

[l-spine obl (2 of 2)]
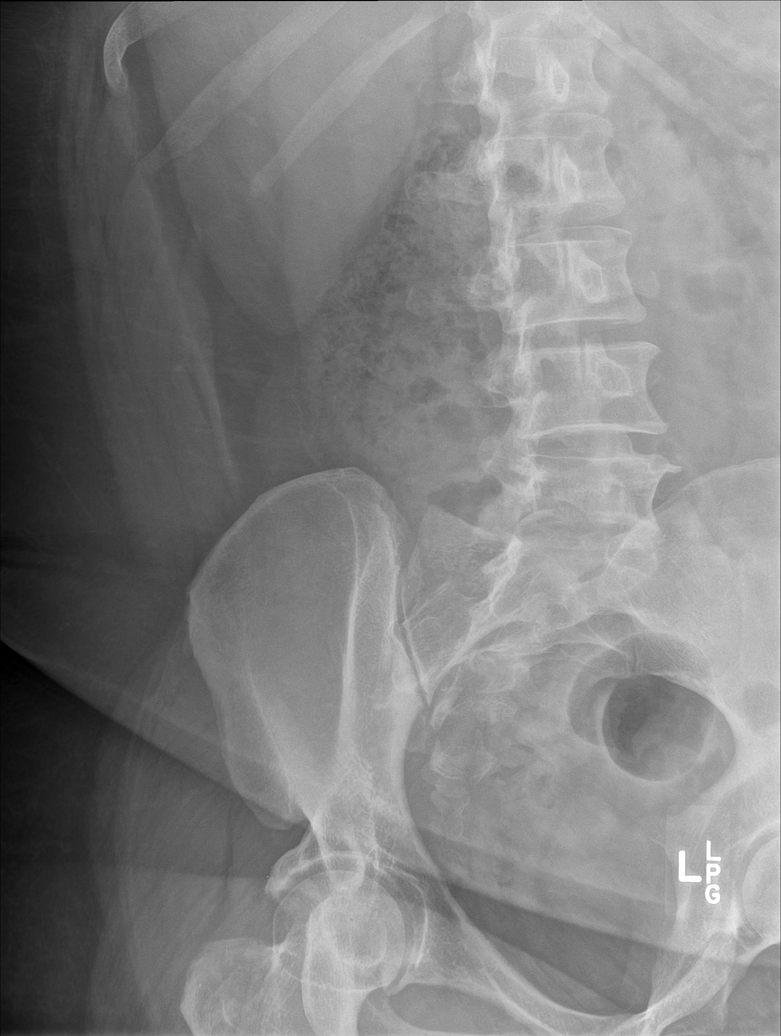

[l-spine lat]
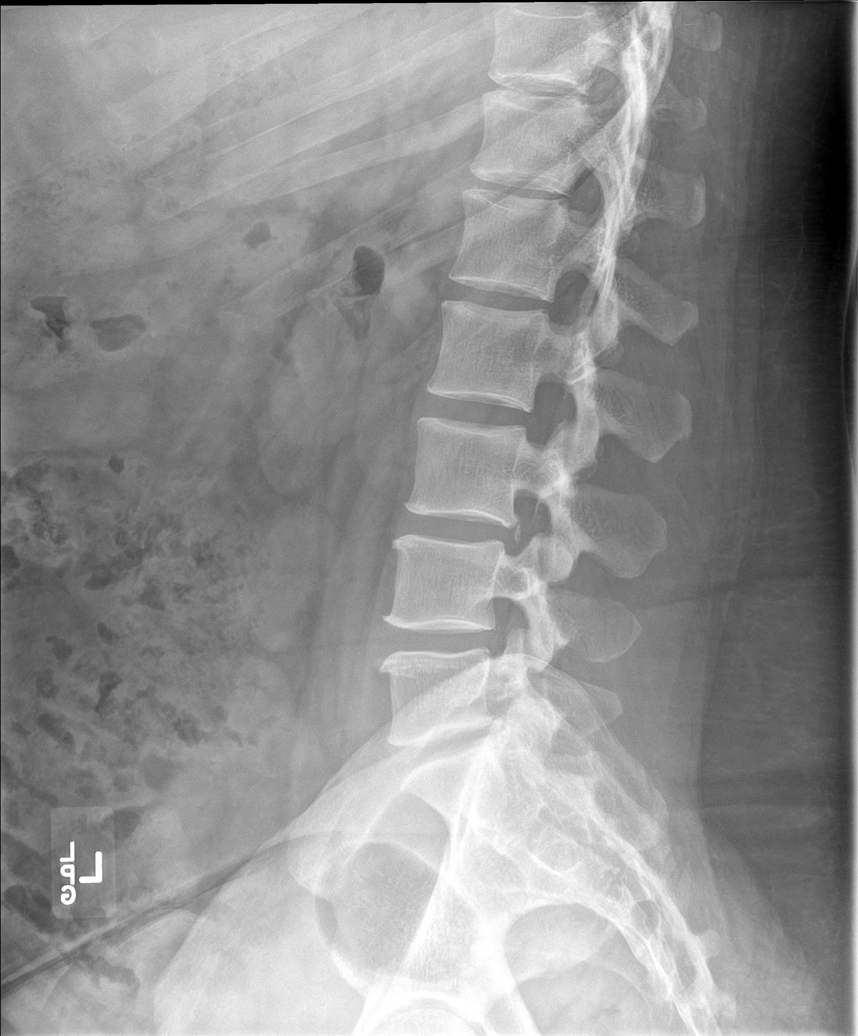

[l-spine spot]
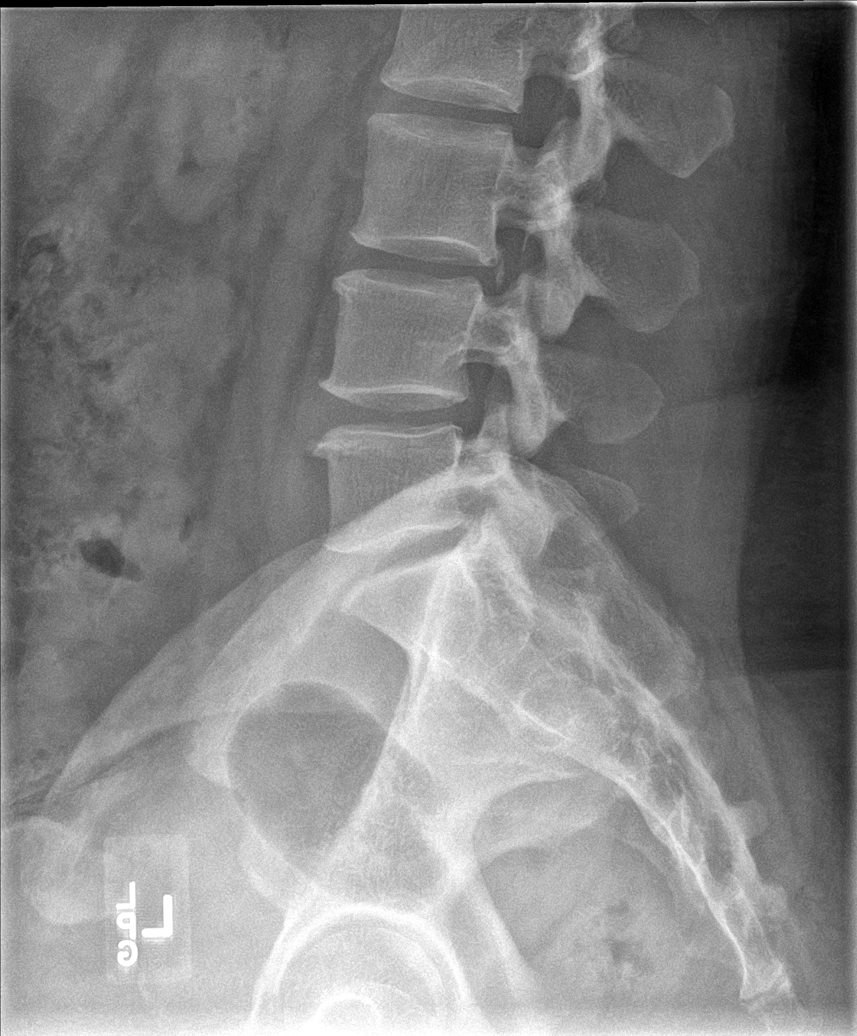

[5 of 5 positions shown; findings below may reference images not displayed]

FINDINGS: Soft tissue structures are unremarkable. No bowel distention. Stool
noted throughout the colon. No acute bony abnormality .
IMPRESSION: Stool noted throughout the colon. No bowel distention. No acute
abnormality identified .

## 2017-05-21 DIAGNOSIS — D013 Carcinoma in situ of anus and anal canal: Secondary | ICD-10-CM | POA: Diagnosis not present

## 2017-05-24 ENCOUNTER — Encounter: Payer: Self-pay | Admitting: Physical Therapy

## 2017-05-24 ENCOUNTER — Ambulatory Visit: Payer: PRIVATE HEALTH INSURANCE | Attending: Family Medicine | Admitting: Physical Therapy

## 2017-05-24 DIAGNOSIS — M5417 Radiculopathy, lumbosacral region: Secondary | ICD-10-CM | POA: Diagnosis not present

## 2017-05-24 DIAGNOSIS — R29898 Other symptoms and signs involving the musculoskeletal system: Secondary | ICD-10-CM | POA: Insufficient documentation

## 2017-05-24 DIAGNOSIS — M545 Low back pain: Secondary | ICD-10-CM | POA: Insufficient documentation

## 2017-05-24 NOTE — Therapy (Signed)
[place], [place], [address] Phone: [phone]   Fax:  [phone]  Physical Therapy Evaluation  Patient Details  Name: Denise Ray MRN: [id number] Date of Birth: 27-May-1970 Referring Provider: Dr. Maylon Peppers   Encounter Date: 05/24/2017  PT End of Session - 05/24/17 1435    Visit Number  1    Number of Visits  6    Date for PT Re-Evaluation  07/05/17    PT Start Time  1100    PT Stop Time  1145    PT Time Calculation (min)  45 min    Activity Tolerance  Patient tolerated treatment well    Behavior During Therapy  [facility] for tasks assessed/performed       Past Medical History:  Diagnosis Date  . Anal lesion   . Anxiety   . Arthritis    Right hip  . Borderline diabetes mellitus   . Chronic pain   . COPD with asthma ([place])    hx exacerbation's    . Diabetes mellitus without complication ([place])   . GERD (gastroesophageal reflux disease)   . Hemorrhoids   . History of acute respiratory failure    06/ 2015  and 12/ 2016  CAP and Asthma exacerbation--  vented both times (ARDS)  . History of anal dysplasia    AIN 2/ 3    . History of cervical dysplasia    CIN 2 in 2000  . History of hypothyroidism   . History of pneumonia    hx Required ventilatory support  . History of TIA (transient ischemic attack)    Caused by medication reaction  . Migraines   . OSA on CPAP     Past Surgical History:  Procedure Laterality Date  . CESAREAN SECTION  2001  . CO2 LASER APPLICATION N/A 8/41/3244   Procedure: CO2 LASER APPLICATION;  Surgeon: Leighton Ruff, MD;  Location: [place];  Service: General;  Laterality: N/A;  . CONIZATION OF CERVIX  2000  . DILATION AND CURETTAGE OF UTERUS    . EVALUATION UNDER ANESTHESIA WITH HEMORRHOIDECTOMY N/A 06/15/2014   Procedure: EXAM UNDER ANESTHESIA WITH HEMORRHOIDECTOMY;  Surgeon: Doreen Salvage, MD;  Location: [place];  Service: General;   Laterality: N/A;  . EXCISION OF SKIN TAG N/A 06/15/2014   Procedure: EXCISION OF SKIN TAGS;  Surgeon: Doreen Salvage, MD;  Location: [place];  Service: General;  Laterality: N/A;  . HIGH RESOLUTION ANOSCOPY N/A 10/11/2014   Procedure: HIGH RESOLUTION ANOSCOPY WITH BIOPSY;  Surgeon: Leighton Ruff, MD;  Location: [facility];  Service: General;  Laterality: N/A;  . HYSTEROSCOPY W/D&C  09/12/2007  &  2005  . RECTAL EXAM UNDER ANESTHESIA N/A 10/11/2014   Procedure: ANAL EXAM UNDER ANESTHESIA;  Surgeon: Leighton Ruff, MD;  Location: [place];  Service: General;  Laterality: N/A;  . RECTAL EXAM UNDER ANESTHESIA N/A 12/18/2015   Procedure: ANAL EXAM UNDER ANESTHESIA  EXCISION ANAL MARGIN LESION;  Surgeon: Leighton Ruff, MD;  Location: [place];  Service: General;  Laterality: N/A;  . ROBOTIC ASSISTED TOTAL HYSTERECTOMY N/A 10/11/2012   Procedure: ROBOTIC ASSISTED TOTAL HYSTERECTOMY;  Surgeon: Lyman Speller, MD;  Location: [facility];  Service: Gynecology;  Laterality: N/A;  . SALPINGOOPHORECTOMY Bilateral 10/11/2012   Procedure: SALPINGO OOPHORECTOMY;  Surgeon: Lyman Speller, MD;  Location: [facility];  Service: Gynecology;  Laterality: Bilateral;  . TRANSTHORACIC ECHOCARDIOGRAM  05/07/2015   lvsf  vigorous, ef 70-75%/  trivial MR and TR  . VIDEO ASSISTED THORACOSCOPY (VATS)/THOROCOTOMY Right 11/04/2001   w/ Resection duplication esophageal cyst    There were no vitals filed for this visit.   Subjective Assessment - 05/24/17 1110    Subjective  Patient was doing CPR on a patient 04/11/17 and felt a severe pain in her lower back, described as an "explosion". She had difficulty standing upright after that, and she was initially unable to sit.  Pain was radiating down her LLE.  Most of her pain has resolved.  But, she currently has pain ( "heaviness") in her lower back. which does not radiate into her LEs.  She has been on a lighter duty  schedule which entails sitting as needed, no lifting and no critical patients.      Pertinent History  rectal caner with surgery, chronic pain, COPD, DM, obesity    Limitations  Sitting;Lifting;Standing;House hold activities;Other (comment) relations with husband     How long can you sit comfortably?  has to repostion ALOT    How long can you stand comfortably?  varies, has to lean on wall.  Job requires her to stand 4-6 hours at a time , now she is on floors in patients room but no ICU     How long can you walk comfortably?  is guarded but gets tired, pain increases if she walks briskly.     Diagnostic tests  XR     Patient Stated Goals  Pt would like to return to normal, full time duty as a respiratory therapist.     Currently in Pain?  Yes    Pain Score  3     Pain Location  Back    Pain Orientation  Lower;Other (Comment) center    Pain Descriptors / Indicators  Heaviness;Pressure;Other (Comment)    Pain Type  Acute pain;Chronic pain    Pain Radiating Towards  was in post thigh and is now positional    Pain Onset  More than a month ago    Pain Frequency  Intermittent    Aggravating Factors   moving quickly, returning to stand, sitting with feet on the floor (knees flexed)     Pain Relieving Factors  heat, meds    Effect of Pain on Daily Activities  limits mobility, changing positions, walks and transitions carefully.          [facility] PT Assessment - 05/24/17 0001      Assessment   Medical Diagnosis  back pain     Referring Provider  Dr. Maylon Peppers    Onset Date/Surgical Date  04/11/17    Hand Dominance  Right    Next MD Visit  3 weeks     Prior Therapy  no       Precautions   Precautions  None      Restrictions   Weight Bearing Restrictions  No      Balance Screen   Has the patient fallen in the past 6 months  No    Has the patient had a decrease in activity level because of a fear of falling?   No    Is the patient reluctant to leave their home because of a fear of falling?    No      Home Environment   Living Environment  Private residence    Living Arrangements  Spouse/significant other;Children;Parent    Type of [place] to enter  Entrance Stairs-Number of Steps  5    Entrance Stairs-Rails  Right    Home Layout  One level      Prior Function   Vocation  Full time employment    Leisure  has 6 yr old son, like to ride 4 wheeler , familty      Cognition   Overall Cognitive Status  Within Functional Limits for tasks assessed      Observation/Other Assessments   Focus on Therapeutic Outcomes (FOTO)   53%      Sensation   Light Touch  Appears Intact      Posture/Postural Control   Posture/Postural Control  Postural limitations    Postural Limitations  Rounded Shoulders;Forward head;Decreased thoracic kyphosis;Posterior pelvic tilt      AROM   Lumbar Flexion  WFL pain to return to stand    Lumbar Extension  unable     Lumbar - Right Side Bend  tight on L , no pain    Lumbar - Left Side Bend  pain, unable end range     Lumbar - Right Rotation  WFL    Lumbar - Left Rotation  WFL       PROM   Overall PROM Comments  lacks IR bilateral hips       Strength   Overall Strength Comments  WFL except hip ext     Right Hip Extension  4/5 pain and also prone knee ext incr pain    Left Hip Extension  4/5 prone knee bend painful ext      Palpation   Spinal mobility  WFL     Palpation comment  no pain elicited with sacral palpation       Special Tests    Special Tests  Lumbar;Sacrolliac Tests      Slump test   Findings  Negative      Prone Knee Bend Test   Findings  Positive    Comment  bilat      Straight Leg Raise   Findings  Negative      Sacral Compression   Findings  Negative      Bed Mobility   Bed Mobility  -- cautious and guarded       Ambulation/Gait   Gait Comments  guarded and slow              Objective measurements completed on examination: See above findings.    PT Education -  05/24/17 1435    Education provided  Yes    Education Details  PT/POC, HEP, eval findings, body mechanics , spinal mobility    Person(s) Educated  Patient    Methods  Explanation;Demonstration;Handout    Comprehension  Verbalized understanding;Returned demonstration          PT Long Term Goals - 05/24/17 1436      PT LONG TERM GOAL #1   Title  Pt will be able to be I with HEP for posture and core, body mechanics to ensure safety at work.     Time  6    Period  Weeks    Status  New    Target Date  07/05/17      PT LONG TERM GOAL #2   Title  Pt will be able sit as needed for home, rest, comfort with only rare min pain increase with knees bent to 90 deg or more .     Baseline  pain if knees flexed and back fully extended     Time  6    Period  Weeks    Status  New    Target Date  07/05/17      PT LONG TERM GOAL #3   Title  Pt will be able to resume normal work tasks, duties to include lifting up to 30 lbs.     Time  6    Period  Weeks    Status  New    Target Date  07/05/17      PT LONG TERM GOAL #4   Title  Pt will score less than 40% on FOTO to demo improved functional mobility.     Time  6    Period  Weeks    Status  New    Target Date  07/05/17      PT LONG TERM GOAL #5   Title  Pt will be able to stand and extend backwards to reach objects, obtain equipment at work.     Time  6    Period  Weeks    Status  New    Target Date  07/05/17             Plan - 05/24/17 1444    Clinical Impression Statement  This patient presents for low complexity eval of low back /sacral pain after providing CPR to a critical patient at work.  She is unable to extend back at all in standing without pain.  She had back pain with prone hip extension.  She posteriorly tilts to relieve her pain.   She has good strength in hips, but tightness in hips (lacks Internal rotation).      Clinical Presentation  Stable    Clinical Decision Making  Low    Rehab Potential  Excellent    PT  Frequency  1x / week    PT Duration  6 weeks    PT Treatment/Interventions  ADLs/Self Care Home Management;Electrical Stimulation;Functional mobility training;Neuromuscular re-education;Taping;Therapeutic activities;Cryotherapy;Ultrasound;Traction;Moist Heat;Gait training;Therapeutic exercise;Patient/family education;Manual techniques;Passive range of motion;Dry needling    PT Next Visit Plan  sacral mobs, check HEP, begin some core strength flexion based , check body mech with lifting, squatting     PT Home Exercise Plan  PPT, cat to neutral (no camel) and childs pose wide legs     Consulted and Agree with Plan of Care  Patient       Patient will benefit from skilled therapeutic intervention in order to improve the following deficits and impairments:  Abnormal gait, Decreased mobility, Obesity, Pain, Impaired flexibility, Increased fascial restricitons, Decreased strength, Decreased range of motion, Difficulty walking  Visit Diagnosis: Radiculopathy, lumbosacral region  Acute midline low back pain, with sciatica presence unspecified  Other symptoms and signs involving the musculoskeletal system     Problem List Patient Active Problem List   Diagnosis Date Noted  . Smoker 03/24/2017  . Anxiety and depression 03/24/2017  . Soft tissue abscess- abd wall RLQ 03/24/2017  . Chronic pain 03/24/2017  . Cough 10/30/2016  . Diabetes ([place]) 09/11/2016  . COPD exacerbation ([place]) 08/31/2016  . Bradycardia 05/07/2015  . Intrinsic asthma 11/10/2013  . OSA (obstructive sleep apnea) 11/10/2013  . Rhinovirus infection 11/02/2013  . Acute lung injury 11/02/2013  . Respiratory distress 10/31/2013  . Acute respiratory failure ([place]) 10/31/2013  . Respiratory failure ([place]) 10/31/2013  . Atypical pneumonia 10/26/2013  . CAP (community acquired pneumonia) 10/26/2013  . Asthma with acute exacerbation 10/26/2013  . Unspecified hypothyroidism 10/26/2013  . Hypokalemia 10/26/2013  . Obesity,  unspecified 10/26/2013  .  PTSD (post-traumatic stress disorder) 01/18/2013  . Depression, major, recurrent ([place]) 01/18/2013    Denise Ray 05/24/2017, 3:02 PM  [facility] [address] [place], [place], [address] Phone: [phone]   Fax:  [phone]  Name: Denise Ray MRN: [id number] Date of Birth: 1971/04/30   Raeford Razor, PT 05/24/17 3:02 PM Phone: [phone] Fax: [phone]

## 2017-06-02 ENCOUNTER — Ambulatory Visit: Payer: PRIVATE HEALTH INSURANCE | Attending: Family Medicine | Admitting: Physical Therapy

## 2017-06-02 ENCOUNTER — Encounter: Payer: Self-pay | Admitting: Physical Therapy

## 2017-06-02 DIAGNOSIS — R29898 Other symptoms and signs involving the musculoskeletal system: Secondary | ICD-10-CM | POA: Diagnosis present

## 2017-06-02 DIAGNOSIS — M545 Low back pain: Secondary | ICD-10-CM | POA: Diagnosis not present

## 2017-06-02 DIAGNOSIS — M5417 Radiculopathy, lumbosacral region: Secondary | ICD-10-CM | POA: Diagnosis not present

## 2017-06-02 DIAGNOSIS — G4733 Obstructive sleep apnea (adult) (pediatric): Secondary | ICD-10-CM | POA: Diagnosis not present

## 2017-06-02 DIAGNOSIS — J449 Chronic obstructive pulmonary disease, unspecified: Secondary | ICD-10-CM | POA: Diagnosis not present

## 2017-06-02 MED FILL — PANTOPRAZOLE SOD DR 40 MG T: 40 | 90 days supply | Qty: 90 | Fill #0

## 2017-06-02 NOTE — Therapy (Signed)
[place], [place], [address] Phone: [phone]   Fax:  [phone]  Physical Therapy Treatment  Patient Details  Name: Denise Ray MRN: [id number] Date of Birth: May 22, 1970 Referring Provider: Dr. Maylon Peppers   Encounter Date: 06/02/2017  PT End of Session - 06/02/17 0859    Visit Number  2    Number of Visits  6    Date for PT Re-Evaluation  07/05/17    PT Start Time  0849    PT Stop Time  0931    PT Time Calculation (min)  42 min    Activity Tolerance  Patient tolerated treatment well    Behavior During Therapy  [facility] for tasks assessed/performed       Past Medical History:  Diagnosis Date  . Anal lesion   . Anxiety   . Arthritis    Right hip  . Borderline diabetes mellitus   . Chronic pain   . COPD with asthma ([place])    hx exacerbation's    . Diabetes mellitus without complication ([place])   . GERD (gastroesophageal reflux disease)   . Hemorrhoids   . History of acute respiratory failure    06/ 2015  and 12/ 2016  CAP and Asthma exacerbation--  vented both times (ARDS)  . History of anal dysplasia    AIN 2/ 3    . History of cervical dysplasia    CIN 2 in 2000  . History of hypothyroidism   . History of pneumonia    hx Required ventilatory support  . History of TIA (transient ischemic attack)    Caused by medication reaction  . Migraines   . OSA on CPAP     Past Surgical History:  Procedure Laterality Date  . CESAREAN SECTION  2001  . CO2 LASER APPLICATION N/A 1/32/4401   Procedure: CO2 LASER APPLICATION;  Surgeon: Leighton Ruff, MD;  Location: [place];  Service: General;  Laterality: N/A;  . CONIZATION OF CERVIX  2000  . DILATION AND CURETTAGE OF UTERUS    . EVALUATION UNDER ANESTHESIA WITH HEMORRHOIDECTOMY N/A 06/15/2014   Procedure: EXAM UNDER ANESTHESIA WITH HEMORRHOIDECTOMY;  Surgeon: Doreen Salvage, MD;  Location: [place];  Service: General;   Laterality: N/A;  . EXCISION OF SKIN TAG N/A 06/15/2014   Procedure: EXCISION OF SKIN TAGS;  Surgeon: Doreen Salvage, MD;  Location: [place];  Service: General;  Laterality: N/A;  . HIGH RESOLUTION ANOSCOPY N/A 10/11/2014   Procedure: HIGH RESOLUTION ANOSCOPY WITH BIOPSY;  Surgeon: Leighton Ruff, MD;  Location: [facility];  Service: General;  Laterality: N/A;  . HYSTEROSCOPY W/D&C  09/12/2007  &  2005  . RECTAL EXAM UNDER ANESTHESIA N/A 10/11/2014   Procedure: ANAL EXAM UNDER ANESTHESIA;  Surgeon: Leighton Ruff, MD;  Location: [place];  Service: General;  Laterality: N/A;  . RECTAL EXAM UNDER ANESTHESIA N/A 12/18/2015   Procedure: ANAL EXAM UNDER ANESTHESIA  EXCISION ANAL MARGIN LESION;  Surgeon: Leighton Ruff, MD;  Location: [place];  Service: General;  Laterality: N/A;  . ROBOTIC ASSISTED TOTAL HYSTERECTOMY N/A 10/11/2012   Procedure: ROBOTIC ASSISTED TOTAL HYSTERECTOMY;  Surgeon: Lyman Speller, MD;  Location: [facility];  Service: Gynecology;  Laterality: N/A;  . SALPINGOOPHORECTOMY Bilateral 10/11/2012   Procedure: SALPINGO OOPHORECTOMY;  Surgeon: Lyman Speller, MD;  Location: [facility];  Service: Gynecology;  Laterality: Bilateral;  . TRANSTHORACIC ECHOCARDIOGRAM  05/07/2015   lvsf  vigorous, ef 70-75%/  trivial MR and TR  . VIDEO ASSISTED THORACOSCOPY (VATS)/THOROCOTOMY Right 11/04/2001   w/ Resection duplication esophageal cyst    There were no vitals filed for this visit.  Subjective Assessment - 06/02/17 0850    Subjective  Pt is tired from work.  A lot of bending and walking.  Does her exercises.  Works 7-7.     Currently in Pain?  Yes    Pain Score  6     Pain Location  Back    Pain Orientation  Lower    Pain Descriptors / Indicators  Pressure    Pain Type  Acute pain;Chronic pain    Pain Onset  More than a month ago    Pain Frequency  Intermittent    Aggravating Factors   leaning FW and bending to auscultate  patients     Pain Relieving Factors  heat, meds     Effect of Pain on Daily Activities  work           [facility] Adult PT Treatment/Exercise - 06/02/17 0001      Self-Care   Self-Care  Posture;Other Self-Care Comments    Posture  how to avoid pain with delivering treatment     Other Self-Care Comments   HEP, anatomy , core       Lumbar Exercises: Stretches   Active Hamstring Stretch  3 reps    Single Knee to Chest Stretch  3 reps    Piriformis Stretch  --      Lumbar Exercises: Standing   Other Standing Lumbar Exercises  iso core contraction with UEs on therapy ball , painful       Lumbar Exercises: Supine   Pelvic Tilt  10 reps pain       Lumbar Exercises: Prone   Other Prone Lumbar Exercises  prone iso Transverse abdominus , painful       Lumbar Exercises: Quadruped   [place]/Old Horse  10 reps    [place]/Old Horse Limitations  no pain with ext, less pain with flexion     Other Quadruped Lumbar Exercises  childs pose flat back wide legs 30 sec x 3  added lateral     Other Quadruped Lumbar Exercises  flat back hinge x 5 core tight       Prosthetics   Current prosthetic wear tolerance (days/week)                 PT Education - 06/02/17 0858    Education provided  Yes    Education Details  HEP, body mechanics for work    [employer]) Educated  Patient    Methods  Explanation    Comprehension  Verbalized understanding;Returned demonstration          PT Long Term Goals - 06/02/17 0902      PT LONG TERM GOAL #1   Title  Pt will be able to be I with HEP for posture and core, body mechanics to ensure safety at work.     Status  On-going      PT LONG TERM GOAL #2   Title  Pt will be able sit as needed for home, rest, comfort with only rare min pain increase with knees bent to 90 deg or more .     Status  On-going      PT LONG TERM GOAL #3   Title  Pt will be able to resume normal work tasks, duties to include lifting up to 30 lbs.  Status  On-going      PT  LONG TERM GOAL #4   Title  Pt will score less than 40% on FOTO to demo improved functional mobility.     Status  On-going      PT LONG TERM GOAL #5   Title  Pt will be able to stand and extend backwards to reach objects, obtain equipment at work.     Status  On-going            Plan - 06/02/17 0902    Clinical Impression Statement  Patient has been doing her exercises.  Felt pain with posterior tilting.  She was able to stretch without difficulty, offered alternatives to bending at work (foot on bed rail, step).  Notable pain with prone and standing core contraction.  Localized pain central L5.     PT Treatment/Interventions  ADLs/Self Care Home Management;Electrical Stimulation;Functional mobility training;Neuromuscular re-education;Taping;Therapeutic activities;Cryotherapy;Ultrasound;Traction;Moist Heat;Gait training;Therapeutic exercise;Patient/family education;Manual techniques;Passive range of motion;Dry needling    PT Next Visit Plan   begin some core strength flexion based , check body mech with lifting, squatting     PT Home Exercise Plan  PPT, cat to neutral (no camel) and childs pose wide legs , hamstring, LTR and knee to chest     Consulted and Agree with Plan of Care  Patient       Patient will benefit from skilled therapeutic intervention in order to improve the following deficits and impairments:  Abnormal gait, Decreased mobility, Obesity, Pain, Impaired flexibility, Increased fascial restricitons, Decreased strength, Decreased range of motion, Difficulty walking  Visit Diagnosis: Radiculopathy, lumbosacral region  Acute midline low back pain, with sciatica presence unspecified  Other symptoms and signs involving the musculoskeletal system     Problem List Patient Active Problem List   Diagnosis Date Noted  . Smoker 03/24/2017  . Anxiety and depression 03/24/2017  . Soft tissue abscess- abd wall RLQ 03/24/2017  . Chronic pain 03/24/2017  . Cough 10/30/2016   . Diabetes ([place]) 09/11/2016  . COPD exacerbation ([place]) 08/31/2016  . Bradycardia 05/07/2015  . Intrinsic asthma 11/10/2013  . OSA (obstructive sleep apnea) 11/10/2013  . Rhinovirus infection 11/02/2013  . Acute lung injury 11/02/2013  . Respiratory distress 10/31/2013  . Acute respiratory failure ([place]) 10/31/2013  . Respiratory failure ([place]) 10/31/2013  . Atypical pneumonia 10/26/2013  . CAP (community acquired pneumonia) 10/26/2013  . Asthma with acute exacerbation 10/26/2013  . Unspecified hypothyroidism 10/26/2013  . Hypokalemia 10/26/2013  . Obesity, unspecified 10/26/2013  . PTSD (post-traumatic stress disorder) 01/18/2013  . Depression, major, recurrent ([place]) 01/18/2013    Princetta Uplinger 06/02/2017, 9:36 AM  [facility] [address] [place], [place], [address] Phone: [phone]   Fax:  [phone]  Name: Denise Ray MRN: [id number] Date of Birth: 1970/11/13  Raeford Razor, PT 06/02/17 9:36 AM Phone: [phone] Fax: [phone]

## 2017-06-03 MED FILL — ALPRAZolam 1 MG TABS: 1 | 90 days supply | Qty: 180 | Fill #0

## 2017-06-05 MED FILL — METFORMIN HCL ER 500 MG TAB: 500 | 30 days supply | Qty: 60 | Fill #7

## 2017-06-07 ENCOUNTER — Ambulatory Visit: Payer: PRIVATE HEALTH INSURANCE | Attending: Family Medicine | Admitting: Physical Therapy

## 2017-06-07 ENCOUNTER — Encounter: Payer: Self-pay | Admitting: Physical Therapy

## 2017-06-07 DIAGNOSIS — M545 Low back pain: Secondary | ICD-10-CM | POA: Insufficient documentation

## 2017-06-07 DIAGNOSIS — M5417 Radiculopathy, lumbosacral region: Secondary | ICD-10-CM | POA: Insufficient documentation

## 2017-06-07 DIAGNOSIS — R29898 Other symptoms and signs involving the musculoskeletal system: Secondary | ICD-10-CM | POA: Diagnosis present

## 2017-06-07 NOTE — Therapy (Signed)
[place], [place], [address] Phone: [phone]   Fax:  [phone]  Physical Therapy Treatment  Patient Details  Name: Denise Ray MRN: [id number] Date of Birth: 1970-10-19 Referring Provider: Dr. Maylon Peppers   Encounter Date: 06/07/2017  PT End of Session - 06/07/17 0906    Visit Number  3    Number of Visits  6    Date for PT Re-Evaluation  07/05/17    PT Start Time  0845    PT Stop Time  0933    PT Time Calculation (min)  48 min    Activity Tolerance  Patient tolerated treatment well    Behavior During Therapy  [facility] for tasks assessed/performed       Past Medical History:  Diagnosis Date  . Anal lesion   . Anxiety   . Arthritis    Right hip  . Borderline diabetes mellitus   . Chronic pain   . COPD with asthma ([place])    hx exacerbation's    . Diabetes mellitus without complication ([place])   . GERD (gastroesophageal reflux disease)   . Hemorrhoids   . History of acute respiratory failure    06/ 2015  and 12/ 2016  CAP and Asthma exacerbation--  vented both times (AR[facility])  . History of anal dysplasia    AIN 2/ 3    . History of cervical dysplasia    CIN 2 in 2000  . History of hypothyroidism   . History of pneumonia    hx Required ventilatory support  . History of TIA (transient ischemic attack)    Caused by medication reaction  . Migraines   . OSA on CPAP     Past Surgical History:  Procedure Laterality Date  . CESAREAN SECTION  2001  . CO2 LASER APPLICATION N/A 08/14/5186   Procedure: CO2 LASER APPLICATION;  Surgeon: Leighton Ruff, MD;  Location: [place];  Service: General;  Laterality: N/A;  . CONIZATION OF CERVIX  2000  . DILATION AND CURETTAGE OF UTERUS    . EVALUATION UNDER ANESTHESIA WITH HEMORRHOIDECTOMY N/A 06/15/2014   Procedure: EXAM UNDER ANESTHESIA WITH HEMORRHOIDECTOMY;  Surgeon: Doreen Salvage, MD;  Location: [place];  Service: General;   Laterality: N/A;  . EXCISION OF SKIN TAG N/A 06/15/2014   Procedure: EXCISION OF SKIN TAGS;  Surgeon: Doreen Salvage, MD;  Location: [place];  Service: General;  Laterality: N/A;  . HIGH RESOLUTION ANOSCOPY N/A 10/11/2014   Procedure: HIGH RESOLUTION ANOSCOPY WITH BIOPSY;  Surgeon: Leighton Ruff, MD;  Location: [facility];  Service: General;  Laterality: N/A;  . HYSTEROSCOPY W/D&C  09/12/2007  &  2005  . RECTAL EXAM UNDER ANESTHESIA N/A 10/11/2014   Procedure: ANAL EXAM UNDER ANESTHESIA;  Surgeon: Leighton Ruff, MD;  Location: [place];  Service: General;  Laterality: N/A;  . RECTAL EXAM UNDER ANESTHESIA N/A 12/18/2015   Procedure: ANAL EXAM UNDER ANESTHESIA  EXCISION ANAL MARGIN LESION;  Surgeon: Leighton Ruff, MD;  Location: [place];  Service: General;  Laterality: N/A;  . ROBOTIC ASSISTED TOTAL HYSTERECTOMY N/A 10/11/2012   Procedure: ROBOTIC ASSISTED TOTAL HYSTERECTOMY;  Surgeon: Lyman Speller, MD;  Location: [facility];  Service: Gynecology;  Laterality: N/A;  . SALPINGOOPHORECTOMY Bilateral 10/11/2012   Procedure: SALPINGO OOPHORECTOMY;  Surgeon: Lyman Speller, MD;  Location: [facility];  Service: Gynecology;  Laterality: Bilateral;  . TRANSTHORACIC ECHOCARDIOGRAM  05/07/2015   lvsf  vigorous, ef 70-75%/  trivial MR and TR  . VIDEO ASSISTED THORACOSCOPY (VATS)/THOROCOTOMY Right 11/04/2001   w/ Resection duplication esophageal cyst    There were no vitals filed for this visit.  Subjective Assessment - 06/07/17 0848    Subjective  Worked all weekend.  Mom was admitted to ICU last night.  No pain once she is in a position.  Changing positions causes a deep ache like a bruise in the center of her low back.  Hard to rate .     Currently in Pain?  Yes    Pain Score  5          [facility] Adult PT Treatment/Exercise - 06/07/17 0001      Lumbar Exercises: Stretches   Active Hamstring Stretch  3 reps    Single Knee to Chest  Stretch  1 rep no stretch, really     Lower Trunk Rotation  10 seconds x 10     Pelvic Tilt  10 reps pain       Lumbar Exercises: Aerobic   Nustep  L6 UE and LE for 5 min      Lumbar Exercises: Supine   Pelvic Tilt  5 reps pain     Heel Slides  5 reps    Bridge  10 reps    Basic Lumbar Stabilization  10 reps attempted to do SLR, heel slides with abd draw in, painful     Straight Leg Raise  5 reps    Large Ball Abdominal Isometric  10 reps no pain     Large Ball Oblique Isometric  10 reps 5 each side       Lumbar Exercises: Quadruped   Other Quadruped Lumbar Exercises  childs pose flat back wide legs 30 sec x 3  added lateral       Manual Therapy   Manual Therapy  Soft tissue mobilization;Myofascial release    Soft tissue mobilization  IASTM in childs pose for thoracolumbar fascia, worked also in prone soft tissue to paraspinals, into gluteals and along SI border/  Discomfort noted in L SIJ     Myofascial Release  lumbar              PT Education - 06/07/17 0911    Education provided  Yes    Education Details  core, use of therapy ball for iso core contraction     Person(s) Educated  Patient    Methods  Explanation;Verbal cues    Comprehension  Verbalized understanding;Returned demonstration          PT Long Term Goals - 06/02/17 0902      PT LONG TERM GOAL #1   Title  Pt will be able to be I with HEP for posture and core, body mechanics to ensure safety at work.     Status  On-going      PT LONG TERM GOAL #2   Title  Pt will be able sit as needed for home, rest, comfort with only rare min pain increase with knees bent to 90 deg or more .     Status  On-going      PT LONG TERM GOAL #3   Title  Pt will be able to resume normal work tasks, duties to include lifting up to 30 lbs.     Status  On-going      PT LONG TERM GOAL #4   Title  Pt will score less than 40% on FOTO to demo improved functional mobility.  Status  On-going      PT LONG TERM GOAL #5    Title  Pt will be able to stand and extend backwards to reach objects, obtain equipment at work.     Status  On-going            Plan - 06/07/17 0906    Clinical Impression Statement  Able to activate core in supine without pain today.  She is I with her exercises and has been consistent despite a busy schedule. She has pain with an isolated Transverse abdominus contraction, and worked through pain for a few low level stabilization exercises.  She can do the exercises only if she doesn t draw abs in.  Worked on education on technique and info on pelvic floor as well.      PT Next Visit Plan   begin retry iso core contraction.  Check body mech with lifting, squatting .     PT Home Exercise Plan  PPT, cat to neutral (no camel) and childs pose wide legs , hamstring, LTR and knee to chest , L stab 1     Consulted and Agree with Plan of Care  Patient       Patient will benefit from skilled therapeutic intervention in order to improve the following deficits and impairments:  Abnormal gait, Decreased mobility, Obesity, Pain, Impaired flexibility, Increased fascial restricitons, Decreased strength, Decreased range of motion, Difficulty walking  Visit Diagnosis: Acute midline low back pain, with sciatica presence unspecified  Radiculopathy, lumbosacral region  Other symptoms and signs involving the musculoskeletal system     Problem List Patient Active Problem List   Diagnosis Date Noted  . Smoker 03/24/2017  . Anxiety and depression 03/24/2017  . Soft tissue abscess- abd wall RLQ 03/24/2017  . Chronic pain 03/24/2017  . Cough 10/30/2016  . Diabetes ([place]) 09/11/2016  . COPD exacerbation ([place]) 08/31/2016  . Bradycardia 05/07/2015  . Intrinsic asthma 11/10/2013  . OSA (obstructive sleep apnea) 11/10/2013  . Rhinovirus infection 11/02/2013  . Acute lung injury 11/02/2013  . Respiratory distress 10/31/2013  . Acute respiratory failure ([place]) 10/31/2013  . Respiratory failure ([place])  10/31/2013  . Atypical pneumonia 10/26/2013  . CAP (community acquired pneumonia) 10/26/2013  . Asthma with acute exacerbation 10/26/2013  . Unspecified hypothyroidism 10/26/2013  . Hypokalemia 10/26/2013  . Obesity, unspecified 10/26/2013  . PTSD (post-traumatic stress disorder) 01/18/2013  . Depression, major, recurrent ([place]) 01/18/2013    Rahi Chandonnet 06/07/2017, 9:53 AM  [facility] [address] [place], [place], [address] Phone: [phone]   Fax:  [phone]  Name: Denise Ray MRN: [id number] Date of Birth: Mar 16, 1971  Raeford Razor, PT 06/07/17 9:53 AM Phone: [phone] Fax: [phone]

## 2017-06-09 MED FILL — HYDROCODON-APAP 10-325: 10-325 | 30 days supply | Qty: 120 | Fill #0

## 2017-06-15 ENCOUNTER — Ambulatory Visit: Payer: PRIVATE HEALTH INSURANCE | Attending: Family Medicine | Admitting: Physical Therapy

## 2017-06-15 ENCOUNTER — Encounter: Payer: Self-pay | Admitting: Physical Therapy

## 2017-06-15 DIAGNOSIS — M545 Low back pain: Secondary | ICD-10-CM | POA: Diagnosis not present

## 2017-06-15 DIAGNOSIS — M5417 Radiculopathy, lumbosacral region: Secondary | ICD-10-CM | POA: Diagnosis not present

## 2017-06-15 DIAGNOSIS — R29898 Other symptoms and signs involving the musculoskeletal system: Secondary | ICD-10-CM | POA: Diagnosis not present

## 2017-06-15 NOTE — Therapy (Signed)
[name], [place], [address] Phone: [phone]   Fax:  [phone]  Physical Therapy Treatment  Patient Details  Name: Denise Ray MRN: [id number] Date of Birth: 29-Dec-1970 Referring Provider: Dr. Maylon Peppers   Encounter Date: 06/15/2017  PT End of Session - 06/15/17 0932    Visit Number  4    Number of Visits  6    Date for PT Re-Evaluation  07/05/17    PT Start Time  0933    PT Stop Time  1015    PT Time Calculation (min)  42 min    Activity Tolerance  Patient tolerated treatment well    Behavior During Therapy  [employer] for tasks assessed/performed       Past Medical History:  Diagnosis Date  . Anal lesion   . Anxiety   . Arthritis    Right hip  . Borderline diabetes mellitus   . Chronic pain   . COPD with asthma ([place])    hx exacerbation's    . Diabetes mellitus without complication ([place])   . GERD (gastroesophageal reflux disease)   . Hemorrhoids   . History of acute respiratory failure    06/ 2015  and 12/ 2016  CAP and Asthma exacerbation--  vented both times ([facility])  . History of anal dysplasia    AIN 2/ 3    . History of cervical dysplasia    CIN 2 in 2000  . History of hypothyroidism   . History of pneumonia    hx Required ventilatory support  . History of TIA (transient ischemic attack)    Caused by medication reaction  . Migraines   . OSA on CPAP     Past Surgical History:  Procedure Laterality Date  . CESAREAN SECTION  2001  . CO2 LASER APPLICATION N/A 6/96/2952   Procedure: CO2 LASER APPLICATION;  Surgeon: Leighton Ruff, MD;  Location: [place];  Service: General;  Laterality: N/A;  . CONIZATION OF CERVIX  2000  . DILATION AND CURETTAGE OF UTERUS    . EVALUATION UNDER ANESTHESIA WITH HEMORRHOIDECTOMY N/A 06/15/2014   Procedure: EXAM UNDER ANESTHESIA WITH HEMORRHOIDECTOMY;  Surgeon: Doreen Salvage, MD;  Location: [place];  Service: General;   Laterality: N/A;  . EXCISION OF SKIN TAG N/A 06/15/2014   Procedure: EXCISION OF SKIN TAGS;  Surgeon: Doreen Salvage, MD;  Location: [place];  Service: General;  Laterality: N/A;  . HIGH RESOLUTION ANOSCOPY N/A 10/11/2014   Procedure: HIGH RESOLUTION ANOSCOPY WITH BIOPSY;  Surgeon: Leighton Ruff, MD;  Location: [facility];  Service: General;  Laterality: N/A;  . HYSTEROSCOPY W/D&C  09/12/2007  &  2005  . RECTAL EXAM UNDER ANESTHESIA N/A 10/11/2014   Procedure: ANAL EXAM UNDER ANESTHESIA;  Surgeon: Leighton Ruff, MD;  Location: [place];  Service: General;  Laterality: N/A;  . RECTAL EXAM UNDER ANESTHESIA N/A 12/18/2015   Procedure: ANAL EXAM UNDER ANESTHESIA  EXCISION ANAL MARGIN LESION;  Surgeon: Leighton Ruff, MD;  Location: [place];  Service: General;  Laterality: N/A;  . ROBOTIC ASSISTED TOTAL HYSTERECTOMY N/A 10/11/2012   Procedure: ROBOTIC ASSISTED TOTAL HYSTERECTOMY;  Surgeon: Lyman Speller, MD;  Location: [facility];  Service: Gynecology;  Laterality: N/A;  . SALPINGOOPHORECTOMY Bilateral 10/11/2012   Procedure: SALPINGO OOPHORECTOMY;  Surgeon: Lyman Speller, MD;  Location: [facility];  Service: Gynecology;  Laterality: Bilateral;  . TRANSTHORACIC ECHOCARDIOGRAM  05/07/2015   lvsf  vigorous, ef 70-75%/  trivial MR and TR  . VIDEO ASSISTED THORACOSCOPY (VATS)/THOROCOTOMY Right 11/04/2001   w/ Resection duplication esophageal cyst    There were no vitals filed for this visit.  Subjective Assessment - 06/15/17 0934    Subjective  "I've been feeling normal, but the pain still occurs with specific movements"    Currently in Pain?  Yes    Pain Score  3     Pain Location  Back    Pain Orientation  Lower    Pain Type  Acute pain;Chronic pain    Pain Onset  More than a month ago    Pain Frequency  Intermittent    Aggravating Factors   leaning FW, and bending     Pain Relieving Factors  heat, meds,          [facility] PT  Assessment - 06/15/17 0001      Special Tests    Special Tests  Sacrolliac Tests    Sacroiliac Tests   Gaenslen's Test forward flexion testing (+) on R, gillets test neg      Sacral thrust    Findings  Positive    Side  Right      Gaenslen's test   Findings  Negative                  [facility] Adult PT Treatment/Exercise - 06/15/17 0001      Self-Care   Other Self-Care Comments   using a SI belt and its benefits to promote stability.      Knee/Hip Exercises: Stretches   Other Knee/Hip Stretches  adductor stretch 3 x 30 sec      Knee/Hip Exercises: Sidelying   Hip ABduction  10 reps;3 sets      Manual Therapy   Manual Therapy  Joint mobilization    Joint Mobilization  long axis distraction grade 5 RLE only             PT Education - 06/15/17 1011    Education provided  Yes    Education Details  assessement findings and benefits of SI belt. updated HEP to promote SI stabilization    Person(s) Educated  Patient    Methods  Explanation;Verbal cues;Demonstration    Comprehension  Verbalized understanding;Verbal cues required;Returned demonstration          PT Long Term Goals - 06/02/17 0902      PT LONG TERM GOAL #1   Title  Pt will be able to be I with HEP for posture and core, body mechanics to ensure safety at work.     Status  On-going      PT LONG TERM GOAL #2   Title  Pt will be able sit as needed for home, rest, comfort with only rare min pain increase with knees bent to 90 deg or more .     Status  On-going      PT LONG TERM GOAL #3   Title  Pt will be able to resume normal work tasks, duties to include lifting up to 30 lbs.     Status  On-going      PT LONG TERM GOAL #4   Title  Pt will score less than 40% on FOTO to demo improved functional mobility.     Status  On-going      PT LONG TERM GOAL #5   Title  Pt will be able to stand and extend backwards to reach objects, obtain equipment at work.  Status  On-going             Plan - 06/15/17 1012    Clinical Impression Statement  pt reports 3/10 pain located in the sacral region. Further assessment revealed SI involvement with positive sacral sprining and forward flexion testing. utilized SI belt which she reported significant relief of pain and tightness especially with forward flexion and L side bending. preformed stretching of the hip abductors and stretching of the adductors she was able to perform sidebending with decreased pain when she wasn't wearing the SI belt. she declined modalities post session .     PT Next Visit Plan   begin retry iso core contraction.  Check body mech with lifting, squatting, potential outflare of the R innominate. hip abduction strengthnig, adductor stretching, pelvic stability exercises.     PT Home Exercise Plan  PPT, cat to neutral (no camel) and childs pose wide legs , hamstring, LTR and knee to chest , L stab 1, hip abductor strengthening, adductor stretching in supine (fabers) and standing    Consulted and Agree with Plan of Care  Patient       Patient will benefit from skilled therapeutic intervention in order to improve the following deficits and impairments:  Abnormal gait, Decreased mobility, Obesity, Pain, Impaired flexibility, Increased fascial restricitons, Decreased strength, Decreased range of motion, Difficulty walking  Visit Diagnosis: Acute midline low back pain, with sciatica presence unspecified  Radiculopathy, lumbosacral region  Other symptoms and signs involving the musculoskeletal system     Problem List Patient Active Problem List   Diagnosis Date Noted  . Smoker 03/24/2017  . Anxiety and depression 03/24/2017  . Soft tissue abscess- abd wall RLQ 03/24/2017  . Chronic pain 03/24/2017  . Cough 10/30/2016  . Diabetes ([place]) 09/11/2016  . COPD exacerbation ([place]) 08/31/2016  . Bradycardia 05/07/2015  . Intrinsic asthma 11/10/2013  . OSA (obstructive sleep apnea) 11/10/2013  . Rhinovirus  infection 11/02/2013  . Acute lung injury 11/02/2013  . Respiratory distress 10/31/2013  . Acute respiratory failure ([place]) 10/31/2013  . Respiratory failure ([place]) 10/31/2013  . Atypical pneumonia 10/26/2013  . CAP (community acquired pneumonia) 10/26/2013  . Asthma with acute exacerbation 10/26/2013  . Unspecified hypothyroidism 10/26/2013  . Hypokalemia 10/26/2013  . Obesity, unspecified 10/26/2013  . PTSD (post-traumatic stress disorder) 01/18/2013  . Depression, major, recurrent ([place]) 01/18/2013   [place]arr [place] PT, DPT, LAT, ATC  06/15/17  10:31 AM      [facility] [address] [place], [place], [address] Phone: [phone]   Fax:  [phone]  Name: MEILAH DELROSARIO MRN: [id number] Date of Birth: [birth year]

## 2017-06-20 MED FILL — FREESTYLE LITE TEST STRIP: 50 days supply | Qty: 100 | Fill #4

## 2017-06-20 MED FILL — FREESTYLE LANCETS: 50 days supply | Qty: 100 | Fill #4

## 2017-06-20 MED FILL — AZITHROMYCIN 250 MG TAB: 250 | 5 days supply | Qty: 6 | Fill #2

## 2017-06-23 ENCOUNTER — Ambulatory Visit: Payer: PRIVATE HEALTH INSURANCE | Admitting: Physical Therapy

## 2017-06-29 ENCOUNTER — Encounter: Payer: Self-pay | Admitting: Physical Therapy

## 2017-06-29 ENCOUNTER — Ambulatory Visit: Payer: PRIVATE HEALTH INSURANCE | Attending: Family Medicine | Admitting: Physical Therapy

## 2017-06-29 DIAGNOSIS — R29898 Other symptoms and signs involving the musculoskeletal system: Secondary | ICD-10-CM | POA: Diagnosis present

## 2017-06-29 DIAGNOSIS — M545 Low back pain: Secondary | ICD-10-CM | POA: Insufficient documentation

## 2017-06-29 DIAGNOSIS — M5417 Radiculopathy, lumbosacral region: Secondary | ICD-10-CM | POA: Diagnosis present

## 2017-06-29 NOTE — Therapy (Signed)
[place], [place], [address] Phone: [phone]   Fax:  [phone]  Physical Therapy Treatment  Patient Details  Name: Denise Ray MRN: [id number] Date of Birth: 1971-03-22 Referring Provider: Dr. Maylon Peppers   Encounter Date: 06/29/2017  PT End of Session - 06/29/17 0934    Visit Number  5    Number of Visits  6    Date for PT Re-Evaluation  07/05/17    PT Start Time  0934    PT Stop Time  1014    PT Time Calculation (min)  40 min    Activity Tolerance  Patient tolerated treatment well    Behavior During Therapy  [facility] for tasks assessed/performed       Past Medical History:  Diagnosis Date  . Anal lesion   . Anxiety   . Arthritis    Right hip  . Borderline diabetes mellitus   . Chronic pain   . COPD with asthma ([place])    hx exacerbation's    . Diabetes mellitus without complication ([place])   . GERD (gastroesophageal reflux disease)   . Hemorrhoids   . History of acute respiratory failure    06/ 2015  and 12/ 2016  CAP and Asthma exacerbation--  vented both times (ARDS)  . History of anal dysplasia    AIN 2/ 3    . History of cervical dysplasia    CIN 2 in 2000  . History of hypothyroidism   . History of pneumonia    hx Required ventilatory support  . History of TIA (transient ischemic attack)    Caused by medication reaction  . Migraines   . OSA on CPAP     Past Surgical History:  Procedure Laterality Date  . CESAREAN SECTION  2001  . CO2 LASER APPLICATION N/A 0/12/6759   Procedure: CO2 LASER APPLICATION;  Surgeon: Leighton Ruff, MD;  Location: [place];  Service: General;  Laterality: N/A;  . CONIZATION OF CERVIX  2000  . DILATION AND CURETTAGE OF UTERUS    . EVALUATION UNDER ANESTHESIA WITH HEMORRHOIDECTOMY N/A 06/15/2014   Procedure: EXAM UNDER ANESTHESIA WITH HEMORRHOIDECTOMY;  Surgeon: Doreen Salvage, MD;  Location: [place];  Service: General;   Laterality: N/A;  . EXCISION OF SKIN TAG N/A 06/15/2014   Procedure: EXCISION OF SKIN TAGS;  Surgeon: Doreen Salvage, MD;  Location: [place];  Service: General;  Laterality: N/A;  . HIGH RESOLUTION ANOSCOPY N/A 10/11/2014   Procedure: HIGH RESOLUTION ANOSCOPY WITH BIOPSY;  Surgeon: Leighton Ruff, MD;  Location: [facility];  Service: General;  Laterality: N/A;  . HYSTEROSCOPY W/D&C  09/12/2007  &  2005  . RECTAL EXAM UNDER ANESTHESIA N/A 10/11/2014   Procedure: ANAL EXAM UNDER ANESTHESIA;  Surgeon: Leighton Ruff, MD;  Location: [place];  Service: General;  Laterality: N/A;  . RECTAL EXAM UNDER ANESTHESIA N/A 12/18/2015   Procedure: ANAL EXAM UNDER ANESTHESIA  EXCISION ANAL MARGIN LESION;  Surgeon: Leighton Ruff, MD;  Location: [place];  Service: General;  Laterality: N/A;  . ROBOTIC ASSISTED TOTAL HYSTERECTOMY N/A 10/11/2012   Procedure: ROBOTIC ASSISTED TOTAL HYSTERECTOMY;  Surgeon: Lyman Speller, MD;  Location: [facility];  Service: Gynecology;  Laterality: N/A;  . SALPINGOOPHORECTOMY Bilateral 10/11/2012   Procedure: SALPINGO OOPHORECTOMY;  Surgeon: Lyman Speller, MD;  Location: [facility];  Service: Gynecology;  Laterality: Bilateral;  . TRANSTHORACIC ECHOCARDIOGRAM  05/07/2015   lvsf  vigorous, ef 70-75%/  trivial MR and TR  . VIDEO ASSISTED THORACOSCOPY (VATS)/THOROCOTOMY Right 11/04/2001   w/ Resection duplication esophageal cyst    There were no vitals filed for this visit.  Subjective Assessment - 06/29/17 0935    Subjective  "I was alittle sore after the last session which lasted a few days but I am doing better today, I was able to get more motion"     Currently in Pain?  Yes    Pain Score  4     Pain Onset  More than a month ago    Pain Frequency  Intermittent    Aggravating Factors   certian movements, longer stride    Pain Relieving Factors  heat, meds                      [facility] Adult PT  Treatment/Exercise - 06/29/17 0938      Lumbar Exercises: Stretches   ITB Stretch  Right;2 reps;30 seconds for glute med      Lumbar Exercises: Aerobic   Recumbent Bike  L3 x 5 min      Lumbar Exercises: Seated   Sit to Stand  10 reps 1 set resting between reps, 1 set only touching table     Sit to Stand Limitations  with blue band around the knees       Knee/Hip Exercises: Standing   Gait Training  3 x 30 ft with focus on shortening strike and decreasing trendelenberg pattern when standing on RLE      Knee/Hip Exercises: Sidelying   Hip ABduction  2 sets;Strengthening;Right going to fatigue with 3#      Manual Therapy   Manual therapy comments  MTPR along the R glute medius x 4 how to perform at home.              PT Education - 06/29/17 1014    Education provided  Yes    Education Details  updated HEP, and reviewed gait pattern with proper form    Person(s) Educated  Patient    Methods  Explanation;Verbal cues    Comprehension  Verbalized understanding;Verbal cues required          PT Long Term Goals - 06/02/17 0902      PT LONG TERM GOAL #1   Title  Pt will be able to be I with HEP for posture and core, body mechanics to ensure safety at work.     Status  On-going      PT LONG TERM GOAL #2   Title  Pt will be able sit as needed for home, rest, comfort with only rare min pain increase with knees bent to 90 deg or more .     Status  On-going      PT LONG TERM GOAL #3   Title  Pt will be able to resume normal work tasks, duties to include lifting up to 30 lbs.     Status  On-going      PT LONG TERM GOAL #4   Title  Pt will score less than 40% on FOTO to demo improved functional mobility.     Status  On-going      PT LONG TERM GOAL #5   Title  Pt will be able to stand and extend backwards to reach objects, obtain equipment at work.     Status  On-going            Plan - 06/29/17 1016  Clinical Impression Statement  pt reported 4/10 pain today  but states she feels she is improving. continued working on glute strengthening to promote stability at the SIJ on the R. pt demonstrated trendelenburg pattern with RLE stance phase which she is able to correct with cues. post session she reported no increase in pain and declined modalities.     PT Next Visit Plan  Re-assess begin retry iso core contraction.  Check body mech with lifting, squatting, potential outflare of the R innominate. hip abduction strengthnig, adductor stretching, pelvic stability exercises.     PT Home Exercise Plan  PPT, cat to neutral (no camel) and childs pose wide legs , hamstring, LTR and knee to chest , L stab 1, hip abductor strengthening, adductor stretching in supine (fabers) and standing, sit to stand with band around knees, gait training.     Consulted and Agree with Plan of Care  Patient       Patient will benefit from skilled therapeutic intervention in order to improve the following deficits and impairments:  Abnormal gait, Decreased mobility, Obesity, Pain, Impaired flexibility, Increased fascial restricitons, Decreased strength, Decreased range of motion, Difficulty walking  Visit Diagnosis: Acute midline low back pain, with sciatica presence unspecified  Radiculopathy, lumbosacral region  Other symptoms and signs involving the musculoskeletal system     Problem List Patient Active Problem List   Diagnosis Date Noted  . Smoker 03/24/2017  . Anxiety and depression 03/24/2017  . Soft tissue abscess- abd wall RLQ 03/24/2017  . Chronic pain 03/24/2017  . Cough 10/30/2016  . Diabetes ([place]) 09/11/2016  . COPD exacerbation ([place]) 08/31/2016  . Bradycardia 05/07/2015  . Intrinsic asthma 11/10/2013  . OSA (obstructive sleep apnea) 11/10/2013  . Rhinovirus infection 11/02/2013  . Acute lung injury 11/02/2013  . Respiratory distress 10/31/2013  . Acute respiratory failure ([place]) 10/31/2013  . Respiratory failure ([place]) 10/31/2013  . Atypical pneumonia  10/26/2013  . CAP (community acquired pneumonia) 10/26/2013  . Asthma with acute exacerbation 10/26/2013  . Unspecified hypothyroidism 10/26/2013  . Hypokalemia 10/26/2013  . Obesity, unspecified 10/26/2013  . PTSD (post-traumatic stress disorder) 01/18/2013  . Depression, major, recurrent ([place]) 01/18/2013   Starr Lake PT, DPT, LAT, ATC  06/29/17  10:19 AM      [facility] [address] [place], [place], [address] Phone: [phone]   Fax:  [phone]  Name: Denise Ray MRN: [id number] Date of Birth: 1970-12-19

## 2017-07-06 ENCOUNTER — Ambulatory Visit: Payer: PRIVATE HEALTH INSURANCE | Admitting: Physical Therapy

## 2017-07-15 MED FILL — HYDROCODON-APAP 10-325: 10-325 | 30 days supply | Qty: 120 | Fill #0

## 2017-07-19 ENCOUNTER — Ambulatory Visit: Payer: PRIVATE HEALTH INSURANCE | Attending: Family Medicine | Admitting: Physical Therapy

## 2017-07-19 ENCOUNTER — Encounter: Payer: Self-pay | Admitting: Physical Therapy

## 2017-07-19 DIAGNOSIS — M5417 Radiculopathy, lumbosacral region: Secondary | ICD-10-CM | POA: Insufficient documentation

## 2017-07-19 DIAGNOSIS — R29898 Other symptoms and signs involving the musculoskeletal system: Secondary | ICD-10-CM | POA: Diagnosis present

## 2017-07-19 DIAGNOSIS — M545 Low back pain: Secondary | ICD-10-CM | POA: Diagnosis present

## 2017-07-19 NOTE — Therapy (Signed)
[place], [place], [address] Phone: [phone]   Fax:  [phone]  Physical Therapy Treatment / discharge Summary  Patient Details  Name: Denise Ray MRN: [id number] Date of Birth: November 03, 1970 Referring Provider: Dr. Maylon Peppers   Encounter Date: 07/19/2017  PT End of Session - 07/19/17 1504    Visit Number  6    Number of Visits  6    Date for PT Re-Evaluation  07/19/17    PT Start Time  1500    PT Stop Time  1524    PT Time Calculation (min)  24 min    Activity Tolerance  Patient tolerated treatment well    Behavior During Therapy  [facility] for tasks assessed/performed       Past Medical History:  Diagnosis Date  . Anal lesion   . Anxiety   . Arthritis    Right hip  . Borderline diabetes mellitus   . Chronic pain   . COPD with asthma ([place])    hx exacerbation's    . Diabetes mellitus without complication ([place])   . GERD (gastroesophageal reflux disease)   . Hemorrhoids   . History of acute respiratory failure    06/ 2015  and 12/ 2016  CAP and Asthma exacerbation--  vented both times (ARDS)  . History of anal dysplasia    AIN 2/ 3    . History of cervical dysplasia    CIN 2 in 2000  . History of hypothyroidism   . History of pneumonia    hx Required ventilatory support  . History of TIA (transient ischemic attack)    Caused by medication reaction  . Migraines   . OSA on CPAP     Past Surgical History:  Procedure Laterality Date  . CESAREAN SECTION  2001  . CO2 LASER APPLICATION N/A 0/62/6948   Procedure: CO2 LASER APPLICATION;  Surgeon: Leighton Ruff, MD;  Location: [place];  Service: General;  Laterality: N/A;  . CONIZATION OF CERVIX  2000  . DILATION AND CURETTAGE OF UTERUS    . EVALUATION UNDER ANESTHESIA WITH HEMORRHOIDECTOMY N/A 06/15/2014   Procedure: EXAM UNDER ANESTHESIA WITH HEMORRHOIDECTOMY;  Surgeon: Doreen Salvage, MD;  Location: [place];   Service: General;  Laterality: N/A;  . EXCISION OF SKIN TAG N/A 06/15/2014   Procedure: EXCISION OF SKIN TAGS;  Surgeon: Doreen Salvage, MD;  Location: [place];  Service: General;  Laterality: N/A;  . HIGH RESOLUTION ANOSCOPY N/A 10/11/2014   Procedure: HIGH RESOLUTION ANOSCOPY WITH BIOPSY;  Surgeon: Leighton Ruff, MD;  Location: [facility];  Service: General;  Laterality: N/A;  . HYSTEROSCOPY W/D&C  09/12/2007  &  2005  . RECTAL EXAM UNDER ANESTHESIA N/A 10/11/2014   Procedure: ANAL EXAM UNDER ANESTHESIA;  Surgeon: Leighton Ruff, MD;  Location: [place];  Service: General;  Laterality: N/A;  . RECTAL EXAM UNDER ANESTHESIA N/A 12/18/2015   Procedure: ANAL EXAM UNDER ANESTHESIA  EXCISION ANAL MARGIN LESION;  Surgeon: Leighton Ruff, MD;  Location: [place];  Service: General;  Laterality: N/A;  . ROBOTIC ASSISTED TOTAL HYSTERECTOMY N/A 10/11/2012   Procedure: ROBOTIC ASSISTED TOTAL HYSTERECTOMY;  Surgeon: Lyman Speller, MD;  Location: [facility];  Service: Gynecology;  Laterality: N/A;  . SALPINGOOPHORECTOMY Bilateral 10/11/2012   Procedure: SALPINGO OOPHORECTOMY;  Surgeon: Lyman Speller, MD;  Location: [facility];  Service: Gynecology;  Laterality: Bilateral;  . TRANSTHORACIC ECHOCARDIOGRAM  05/07/2015  lvsf vigorous, ef 70-75%/  trivial MR and TR  . VIDEO ASSISTED THORACOSCOPY (VATS)/THOROCOTOMY Right 11/04/2001   w/ Resection duplication esophageal cyst    There were no vitals filed for this visit.  Subjective Assessment - 07/19/17 1503    Subjective  "i've been doing the exercise and it really help"     Currently in Pain?  No/denies    Pain Score  0-No pain         [facility] PT Assessment - 07/19/17 1508      Observation/Other Assessments   Focus on Therapeutic Outcomes (FOTO)   26% limited      AROM   Lumbar Extension  WNL    Lumbar - Right Side Bend  WNL    Lumbar - Left Side Bend  WNL    Lumbar - Right Rotation  WNL     Lumbar - Left Rotation  WNL      Strength   Right Hip Extension  5/5    Left Hip Extension  5/5                  [facility] Adult PT Treatment/Exercise - 07/19/17 1526      Therapeutic Activites    Therapeutic Activities  Lifting    Lifting  proper lifting techniques from floor to waist and stepping placeing 28# on shelf. cues to keep weight close to prevent over activation of the back.              PT Education - 07/19/17 1525    Education provided  Yes    Education Details  reviewed previously provided HEp and how to progress/ and importance of continuing exercise to maintain.     Person(s) Educated  Patient    Methods  Explanation;Verbal cues    Comprehension  Verbalized understanding;Verbal cues required          PT Long Term Goals - 07/19/17 1511      PT LONG TERM GOAL #1   Title  Pt will be able to be I with HEP for posture and core, body mechanics to ensure safety at work.     Time  6    Period  Weeks    Status  Achieved      PT LONG TERM GOAL #2   Title  Pt will be able sit as needed for home, rest, comfort with only rare min pain increase with knees bent to 90 deg or more .     Time  6    Period  Weeks    Status  Achieved      PT LONG TERM GOAL #3   Title  Pt will be able to resume normal work tasks, duties to include lifting up to 30 lbs.     Time  6    Period  Weeks    Status  Achieved      PT LONG TERM GOAL #4   Title  Pt will score less than 40% on FOTO to demo improved functional mobility.     Time  6    Period  Weeks    Status  Achieved      PT LONG TERM GOAL #5   Title  Pt will be able to stand and extend backwards to reach objects, obtain equipment at work.     Time  6    Period  Weeks    Status  Achieved            Plan -  07/19/17 1524    Clinical Impression Statement  pt reports no pain today and states she is doing with only minimal intermittent soreness which she is able to control with stretching and exercises.  she has met all goals today and additionally reports no pain. pt is able to maintain and progress her current level of function and will be discharged from PT today.     PT Next Visit Plan  D/C    PT Home Exercise Plan  PPT, cat to neutral (no camel) and childs pose wide legs , hamstring, LTR and knee to chest , L stab 1, hip abductor strengthening, adductor stretching in supine (fabers) and standing, sit to stand with band around knees, gait training.     Consulted and Agree with Plan of Care  Patient       Patient will benefit from skilled therapeutic intervention in order to improve the following deficits and impairments:     Visit Diagnosis: No diagnosis found.     Problem List Patient Active Problem List   Diagnosis Date Noted  . Smoker 03/24/2017  . Anxiety and depression 03/24/2017  . Soft tissue abscess- abd wall RLQ 03/24/2017  . Chronic pain 03/24/2017  . Cough 10/30/2016  . Diabetes ([place]) 09/11/2016  . COPD exacerbation ([place]) 08/31/2016  . Bradycardia 05/07/2015  . Intrinsic asthma 11/10/2013  . OSA (obstructive sleep apnea) 11/10/2013  . Rhinovirus infection 11/02/2013  . Acute lung injury 11/02/2013  . Respiratory distress 10/31/2013  . Acute respiratory failure ([place]) 10/31/2013  . Respiratory failure ([place]) 10/31/2013  . Atypical pneumonia 10/26/2013  . CAP (community acquired pneumonia) 10/26/2013  . Asthma with acute exacerbation 10/26/2013  . Unspecified hypothyroidism 10/26/2013  . Hypokalemia 10/26/2013  . Obesity, unspecified 10/26/2013  . PTSD (post-traumatic stress disorder) 01/18/2013  . Depression, major, recurrent ([place]) 01/18/2013   Starr Lake PT, DPT, LAT, ATC  07/19/17  3:28 PM      [facility] [address] [place], [place], [address] Phone: [phone]   Fax:  [phone]  Name: Denise Ray MRN: [id number] Date of Birth: [birth year]     PHYSICAL THERAPY DISCHARGE  SUMMARY  Visits from Start of Care: 6  Current functional level related to goals / functional outcomes: See goals, FOTO 26% limited   Remaining deficits: Intermittent tightness in the hip controlled with stretching and manual trigger point release. See above assessment   Education / Equipment: HEP, theraband, posture  Plan: Patient agrees to discharge.  Patient goals were met. Patient is being discharged due to being pleased with the current functional level.  ?????         Pavlos Yon PT, DPT, LAT, ATC  07/19/17  3:29 PM

## 2017-08-09 MED FILL — METFORMIN HCL ER 500 MG TAB: 500 | 30 days supply | Qty: 60 | Fill #8

## 2017-08-09 MED FILL — VENLAFAXINE HCL ER 75 MG CA: 75 | 90 days supply | Qty: 180 | Fill #0

## 2017-08-09 MED FILL — FREESTYLE LANCETS: 50 days supply | Qty: 100 | Fill #5

## 2017-08-09 MED FILL — ALBUTEROL 0.083% INHAL SOLN: (2.5 MG/3ML | 15 days supply | Qty: 180 | Fill #1

## 2017-08-09 MED FILL — FREESTYLE LITE TEST STRIP: 50 days supply | Qty: 100 | Fill #5

## 2017-08-12 ENCOUNTER — Ambulatory Visit (HOSPITAL_COMMUNITY): Admission: RE | Admit: 2017-08-12 | Payer: 59 | Source: Other Acute Inpatient Hospital | Admitting: Pulmonary Disease

## 2017-08-12 ENCOUNTER — Other Ambulatory Visit (HOSPITAL_COMMUNITY)
Admission: RE | Admit: 2017-08-12 | Discharge: 2017-08-12 | Disposition: A | Discharge status: Home / Self Care | Payer: 59 | Source: Ambulatory Visit | Attending: Pulmonary Disease | Admitting: Pulmonary Disease

## 2017-08-12 MED FILL — HYDROCODON-APAP 10-325: 10-325 | 30 days supply | Qty: 120 | Fill #0

## 2017-08-16 LAB — MISC LABCORP TEST (SEND OUT): Labcorp test code: 71526

## 2017-08-24 LAB — MISC LABCORP TEST (SEND OUT): Labcorp test code: 842039

## 2017-09-02 MED FILL — PANTOPRAZOLE SOD DR 40 MG T: 40 | 90 days supply | Qty: 90 | Fill #1

## 2017-09-02 MED FILL — METFORMIN HCL ER 500 MG TAB: 500 | 30 days supply | Qty: 60 | Fill #9

## 2017-09-02 MED FILL — ALPRAZolam 1 MG TABS: 1 | 90 days supply | Qty: 180 | Fill #1

## 2017-09-13 MED FILL — HYDROCODON-APAP 10-325: 10-325 | 30 days supply | Qty: 120 | Fill #0

## 2017-10-03 MED FILL — FREESTYLE LITE TEST STRIP: 50 days supply | Qty: 100 | Fill #6

## 2017-10-03 MED FILL — FREESTYLE LANCETS: 50 days supply | Qty: 100 | Fill #6

## 2017-10-04 MED FILL — METFORMIN HCL ER 500 MG TAB: 500 | 30 days supply | Qty: 60 | Fill #0

## 2017-10-13 MED FILL — HYDROCODON-APAP 10-325: 10-325 | 30 days supply | Qty: 120 | Fill #0

## 2017-12-02 MED FILL — ALPRAZolam 1 MG TABS: 1 | 90 days supply | Qty: 180 | Fill #0

## 2017-12-02 MED FILL — PANTOPRAZOLE SOD DR 40 MG T: 40 | 90 days supply | Qty: 90 | Fill #2

## 2017-12-02 MED FILL — VENLAFAXINE HCL ER 75 MG CA: 75 | 90 days supply | Qty: 180 | Fill #1

## 2017-12-23 ENCOUNTER — Ambulatory Visit (INDEPENDENT_AMBULATORY_CARE_PROVIDER_SITE_OTHER): Payer: 59 | Admitting: Obstetrics & Gynecology

## 2017-12-23 ENCOUNTER — Other Ambulatory Visit: Payer: Self-pay

## 2017-12-23 ENCOUNTER — Encounter: Payer: Self-pay | Admitting: Obstetrics & Gynecology

## 2017-12-23 VITALS — BP 110/60 | HR 72 | Resp 14 | Ht 65.0 in | Wt 227.2 lb

## 2017-12-23 DIAGNOSIS — R634 Abnormal weight loss: Secondary | ICD-10-CM | POA: Diagnosis not present

## 2017-12-23 DIAGNOSIS — Z1211 Encounter for screening for malignant neoplasm of colon: Secondary | ICD-10-CM | POA: Diagnosis not present

## 2017-12-23 DIAGNOSIS — Z01411 Encounter for gynecological examination (general) (routine) with abnormal findings: Secondary | ICD-10-CM | POA: Diagnosis not present

## 2017-12-23 DIAGNOSIS — R232 Flushing: Secondary | ICD-10-CM | POA: Diagnosis not present

## 2017-12-23 NOTE — Progress Notes (Signed)
47 y.o. G1P1001 DivorcedCaucasianF here for annual exam.  Has lost weight, without trying, over the last five months.  Had hospitalization for sepsis due to abscess in December and then hurt back in February.  Did rehab.    Went through another divorce.  This has been stressful.  Now dating someone new and he lives in [place].  Considering moving there to be closer.    Denies vaginal bleeding.    Mother accompanies her today.  Has lost about 40 pounds since I saw her last.  States she can't attribute this to one specific thing although in reviewing history, stress and increased activity level with new significant other may be contributing. She is doing to continue to watch this.    Is having hot flashes that are worse than in the past.  Wonders about menopause.    No LMP recorded. Patient has had a hysterectomy.          Sexually active: Yes.    The current method of family planning is status post hysterectomy.    Exercising: No.  The patient does not participate in regular exercise at present. Smoker:  no  Health Maintenance: Pap:  06/21/15 neg. HR HPV:neg   04/09/14 Neg   History of abnormal Pap:  Yes, LEEP, LGSIL MMG:  08/04/16 BIRADS1:neg Colonoscopy:  Never BMD:   Never TDaP:  2012 Pneumonia vaccination:  Completed with one of her hospitalizations Screening Labs: PCP   reports that she quit smoking about 2 years ago. Her smoking use included cigarettes. She has a 30.00 pack-year smoking history. She has never used smokeless tobacco. She reports that she does not drink alcohol or use drugs.  Past Medical History:  Diagnosis Date  . Anal lesion   . Anxiety   . Arthritis    Right hip  . Borderline diabetes mellitus   . Chronic pain   . COPD with asthma ([place])    hx exacerbation's    . Diabetes mellitus without complication ([place])   . GERD (gastroesophageal reflux disease)   . Hemorrhoids   . History of acute respiratory failure    06/ 2015  and 12/ 2016  CAP and Asthma  exacerbation--  vented both times (ARDS)  . History of anal dysplasia    AIN 2/ 3    . History of cervical dysplasia    CIN 2 in 2000  . History of hypothyroidism   . History of pneumonia    hx Required ventilatory support  . History of TIA (transient ischemic attack)    Caused by medication reaction  . Migraines   . OSA on CPAP     Past Surgical History:  Procedure Laterality Date  . CESAREAN SECTION  2001  . CO2 LASER APPLICATION N/A 11/12/3149   Procedure: CO2 LASER APPLICATION;  Surgeon: Leighton Ruff, MD;  Location: [facility];  Service: General;  Laterality: N/A;  . CONIZATION OF CERVIX  2000  . DILATION AND CURETTAGE OF UTERUS    . EVALUATION UNDER ANESTHESIA WITH HEMORRHOIDECTOMY N/A 06/15/2014   Procedure: EXAM UNDER ANESTHESIA WITH HEMORRHOIDECTOMY;  Surgeon: Doreen Salvage, MD;  Location: [facility];  Service: General;  Laterality: N/A;  . EXCISION OF SKIN TAG N/A 06/15/2014   Procedure: EXCISION OF SKIN TAGS;  Surgeon: Doreen Salvage, MD;  Location: [facility];  Service: General;  Laterality: N/A;  . HIGH RESOLUTION ANOSCOPY N/A 10/11/2014   Procedure: HIGH RESOLUTION ANOSCOPY WITH BIOPSY;  Surgeon: Leighton Ruff, MD;  Location: [facility];  Service: General;  Laterality: N/A;  . HYSTEROSCOPY W/D&C  09/12/2007  &  2005  . RECTAL EXAM UNDER ANESTHESIA N/A 10/11/2014   Procedure: ANAL EXAM UNDER ANESTHESIA;  Surgeon: Leighton Ruff, MD;  Location: [facility];  Service: General;  Laterality: N/A;  . RECTAL EXAM UNDER ANESTHESIA N/A 12/18/2015   Procedure: ANAL EXAM UNDER ANESTHESIA  EXCISION ANAL MARGIN LESION;  Surgeon: Leighton Ruff, MD;  Location: [facility];  Service: General;  Laterality: N/A;  . ROBOTIC ASSISTED TOTAL HYSTERECTOMY N/A 10/11/2012   Procedure: ROBOTIC ASSISTED TOTAL HYSTERECTOMY;  Surgeon: Lyman Speller, MD;  Location: [facility];  Service: Gynecology;  Laterality: N/A;  .  SALPINGOOPHORECTOMY Bilateral 10/11/2012   Procedure: SALPINGO OOPHORECTOMY;  Surgeon: Lyman Speller, MD;  Location: [facility];  Service: Gynecology;  Laterality: Bilateral;  . TRANSTHORACIC ECHOCARDIOGRAM  05/07/2015   lvsf vigorous, ef 70-75%/  trivial MR and TR  . VIDEO ASSISTED THORACOSCOPY (VATS)/THOROCOTOMY Right 11/04/2001   w/ Resection duplication esophageal cyst    Current Outpatient Medications  Medication Sig Dispense Refill  . albuterol (PROVENTIL HFA;VENTOLIN HFA) 108 (90 BASE) MCG/ACT inhaler Inhale 2 puffs into the lungs every 6 (six) hours as needed. wheezing 1 Inhaler 11  . albuterol (PROVENTIL) (5 MG/ML) 0.5% nebulizer solution Take 2.5 mg by nebulization every 6 (six) hours as needed for wheezing or shortness of breath.    . ALPRAZolam (XANAX) 1 MG tablet Take 1 mg by mouth 2 (two) times daily.    . budesonide-formoterol (SYMBICORT) 160-4.5 MCG/ACT inhaler Inhale 2 puffs into the lungs 2 (two) times daily.    . famotidine (PEPCID) 20 MG tablet Take 20 mg by mouth at bedtime.    . furosemide (LASIX) 40 MG tablet Take 40 mg by mouth daily as needed.    . lidocaine (XYLOCAINE) 2 % jelly Apply 1 application every 6 (six) hours as needed topically.  3  . loratadine (CLARITIN) 10 MG tablet Take 10 mg by mouth daily.    . magic mouthwash w/lidocaine SOLN Take 10 mLs 4 (four) times daily as needed by mouth for mouth pain (Thrush).    . metFORMIN (GLUCOPHAGE XR) 500 MG 24 hr tablet Take 1 tablet (500 mg total) by mouth 2 (two) times daily. 60 tablet 12  . pantoprazole (PROTONIX) 40 MG tablet Take 40 mg by mouth 2 (two) times daily. 1/2 tablet twice a day    . polyethylene glycol (MIRALAX / GLYCOLAX) packet Take 17 g by mouth daily as needed for mild constipation.    [name] tiotropium (SPIRIVA HANDIHALER) 18 MCG inhalation capsule Place 1 capsule (18 mcg total) into inhaler and inhale every morning. 30 capsule 12  . venlafaxine XR (EFFEXOR-XR) 75 MG 24 hr capsule Take 150 mg at bedtime  by mouth.  1   No current facility-administered medications for this visit.     Family History  Problem Relation Age of Onset  . Breast cancer Brother 15  . Breast cancer Maternal Aunt   . Depression Mother   . Anesthesia problems Mother        post-op N/V  . Alcohol abuse Father   . Alcohol abuse Cousin     Review of Systems  All other systems reviewed and are negative.   Exam:   BP 110/60 (BP Location: Right Arm, Patient Position: Sitting, Cuff Size: Large)   Pulse 72   Resp 14   Ht 5\' 5"  (1.651 m)   Wt  227 lb 3.2 oz (103.1 kg)   BMI 37.81 kg/m    Height: 5\' 5"  (165.1 cm)  Ht Readings from Last 3 Encounters:  12/23/17 5\' 5"  (1.651 m)  03/24/17 5\' 4"  (1.626 m)  03/02/17 5\' 4"  (1.626 m)    General appearance: alert, cooperative and appears stated age Head: Normocephalic, without obvious abnormality, atraumatic Neck: no adenopathy, supple, symmetrical, trachea midline and thyroid normal to inspection and palpation Lungs: clear to auscultation bilaterally Breasts: normal appearance, no masses or tenderness Heart: regular rate and rhythm Abdomen: soft, non-tender; bowel sounds normal; no masses,  no organomegaly Extremities: extremities normal, atraumatic, no cyanosis or edema Skin: Skin color, texture, turgor normal. No rashes or lesions Lymph nodes: Cervical, supraclavicular, and axillary nodes normal. No abnormal inguinal nodes palpated Neurologic: Grossly normal   Pelvic: External genitalia:  no lesions              Urethra:  normal appearing urethra with no masses, tenderness or lesions              Bartholins and Skenes: normal                 Vagina: normal appearing vagina with normal color and discharge, no lesions              Cervix: no lesions              Pap taken: No. Bimanual Exam:  Uterus:  uterus absent              Adnexa: no mass, fullness, tenderness               Rectovaginal: Confirms               Anus:  normal sphincter tone, no  lesions  Chaperone was present for exam.  A:  Well Woman with normal exam H/O robotic TLH/Bilateral salpingectomy 5/14 due to hx of recurrent abnormal pap smears and h/o LEEP.   H/O AIN 2/3 H/o asthma/COPD with multiple hospitalizations.  Breathing is much better with weight loss Unintentional weight loss--she is going to monitor this Former smoker, quit 2016 Hypothyroidism H/O brother with breast cancer.  Pt did have negative genetic testing.  Vasomotor symptoms/hot flashes  P:   Mammogram guidelines reviewed.  Doing yearly. pap smear not indicated today.  Pap with neg HR HPV 2017. Reviewed new guidelines for colon cancer screening from [facility] with pt.  Due to AIN treatment, often has rectal bleeding.  Declines doing IFOB.   CBC, estradiol, FSH and TSH obtained today return annually or prn

## 2017-12-24 LAB — CBC WITH DIFFERENTIAL/PLATELET
BASOS ABS: 0 10*3/uL (ref 0.0–0.2)
Basos: 0 %
EOS (ABSOLUTE): 0.1 10*3/uL (ref 0.0–0.4)
EOS: 1 %
HEMATOCRIT: 44 % (ref 34.0–46.6)
HEMOGLOBIN: 14.5 g/dL (ref 11.1–15.9)
IMMATURE GRANULOCYTES: 0 %
Immature Grans (Abs): 0 10*3/uL (ref 0.0–0.1)
LYMPHS ABS: 2.5 10*3/uL (ref 0.7–3.1)
LYMPHS: 29 %
MCH: 29.8 pg (ref 26.6–33.0)
MCHC: 33 g/dL (ref 31.5–35.7)
MCV: 90 fL (ref 79–97)
MONOCYTES: 7 %
Monocytes Absolute: 0.6 10*3/uL (ref 0.1–0.9)
NEUTROS PCT: 63 %
Neutrophils Absolute: 5.4 10*3/uL (ref 1.4–7.0)
Platelets: 317 10*3/uL (ref 150–450)
RBC: 4.87 x10E6/uL (ref 3.77–5.28)
RDW: 15.1 % (ref 12.3–15.4)
WBC: 8.7 10*3/uL (ref 3.4–10.8)

## 2017-12-24 LAB — TSH: TSH: 3.58 u[IU]/mL (ref 0.450–4.500)

## 2017-12-24 LAB — FOLLICLE STIMULATING HORMONE: FSH: 5.4 m[IU]/mL

## 2017-12-24 LAB — ESTRADIOL: ESTRADIOL: 50.4 pg/mL

## 2017-12-26 NOTE — Addendum Note (Signed)
Addended by: Megan Salon on: 12/26/2017 08:02 AM   Modules accepted: Orders

## 2017-12-27 ENCOUNTER — Telehealth: Payer: Self-pay | Admitting: *Deleted

## 2017-12-27 NOTE — Telephone Encounter (Signed)
LM for pt to call back.

## 2017-12-27 NOTE — Telephone Encounter (Signed)
-----   Message from Megan Salon, MD sent at 12/26/2017  8:00 AM EDT ----- Please let pt know her [place] and estradiol levels were normal.  Also her CBC and TSH were normal.  I'd like to proceed with some additional testing.  I'd like her to do a 24 hour urine to test for serotonin levels.  Order has been placed.

## 2017-12-29 NOTE — Telephone Encounter (Signed)
Patient returned call to Re[place]. °

## 2017-12-29 NOTE — Telephone Encounter (Signed)
Notes recorded by Polly Cobia, CMA on 12/29/2017 at 10:41 AM EDT Pt notified. Verbalized understanding. Patient states she will pick up container for 24 hr Urine Monday 8/19 and will collect urine during her day off.

## 2018-02-07 MED FILL — METFORMIN HCL ER 500 MG TAB: 500 | 30 days supply | Qty: 60 | Fill #1

## 2018-02-07 MED FILL — FREESTYLE LITE TEST STRIP: 50 days supply | Qty: 100 | Fill #0

## 2018-02-07 MED FILL — FREESTYLE LANCETS: 50 days supply | Qty: 100 | Fill #0

## 2018-02-11 ENCOUNTER — Other Ambulatory Visit (HOSPITAL_COMMUNITY): Payer: Self-pay | Admitting: Pulmonary Disease

## 2018-02-11 DIAGNOSIS — Z1231 Encounter for screening mammogram for malignant neoplasm of breast: Secondary | ICD-10-CM

## 2018-02-15 MED FILL — HYDROCODON-APAP 10-325: 10-325 | 30 days supply | Qty: 120 | Fill #0

## 2018-02-18 MED FILL — predniSONE 10 MG TABS: 10 | 12 days supply | Qty: 30 | Fill #0

## 2018-02-18 MED FILL — levoFLOXacin 500 MG TABS: 500 | 10 days supply | Qty: 10 | Fill #0

## 2018-02-23 ENCOUNTER — Ambulatory Visit (HOSPITAL_COMMUNITY)
Admission: RE | Admit: 2018-02-23 | Discharge: 2018-02-23 | Disposition: A | Discharge status: Home / Self Care | Payer: 59 | Source: Ambulatory Visit | Attending: Pulmonary Disease | Admitting: Pulmonary Disease

## 2018-02-23 DIAGNOSIS — Z1231 Encounter for screening mammogram for malignant neoplasm of breast: Secondary | ICD-10-CM

## 2018-02-23 IMAGING — MG DIGITAL SCREENING BILATERAL MAMMOGRAM WITH TOMO AND CAD
8 series · 8 of 24 positions shown · non-contrast
Comparison: Previous exam(s).

CLINICAL DATA: Screening.

EXAM:
DIGITAL SCREENING BILATERAL MAMMOGRAM WITH TOMO AND CAD

[R MLO synth-2D]
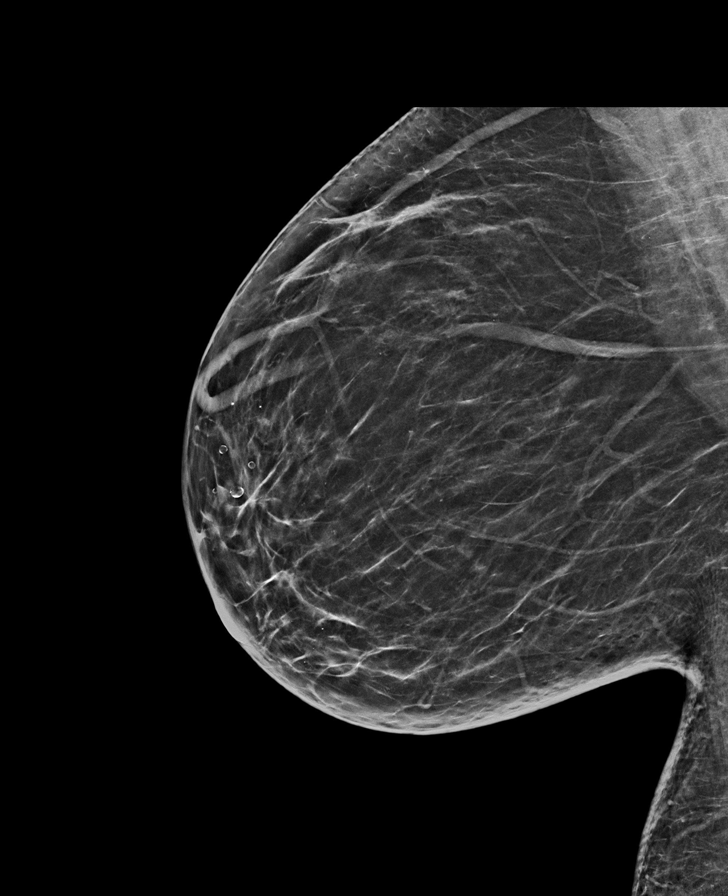

[R CC synth-2D]
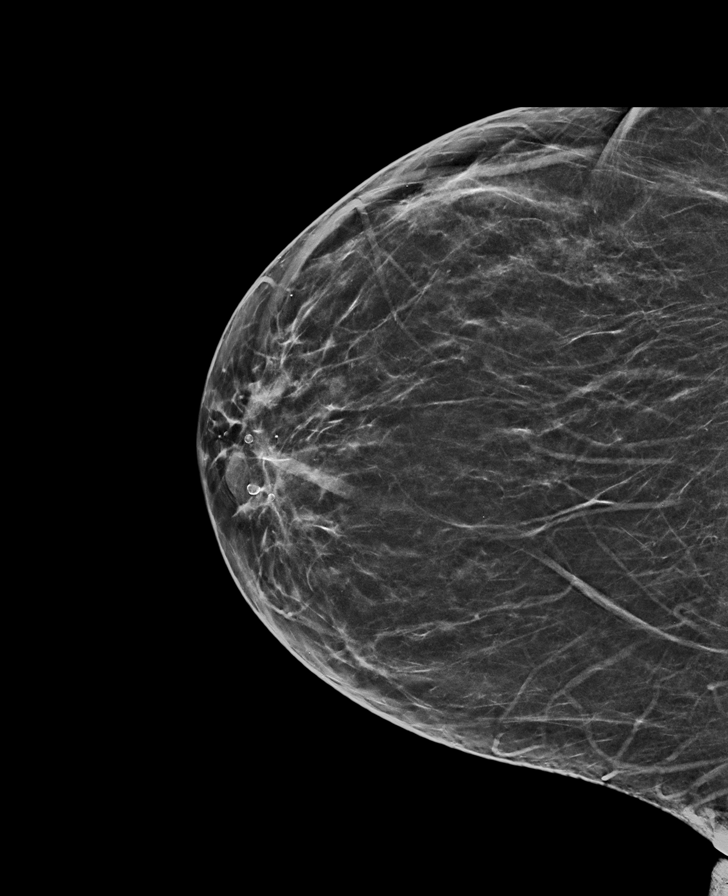

[L MLO synth-2D]
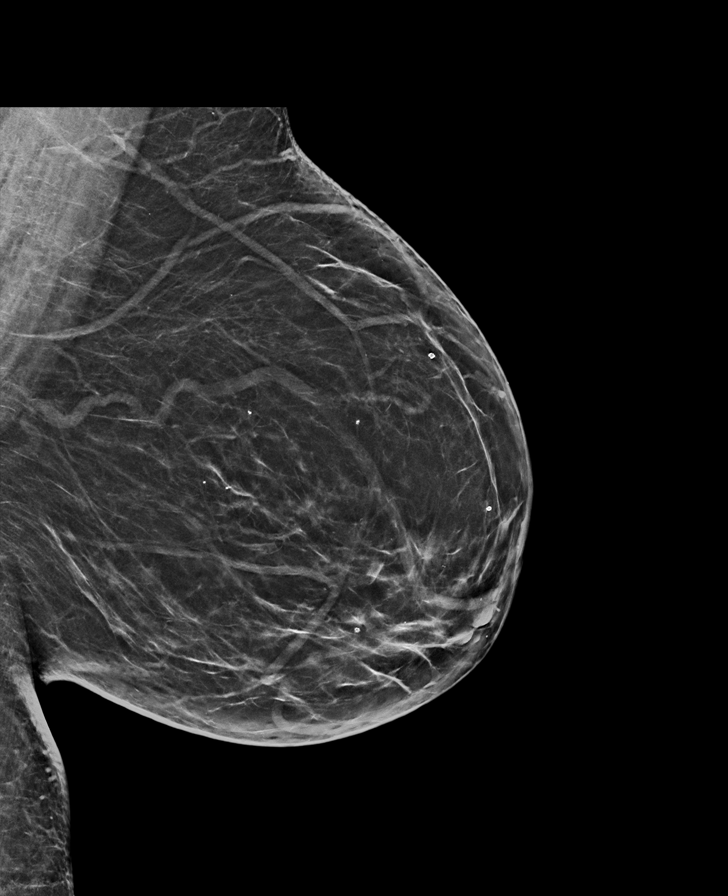

[L CC synth-2D]
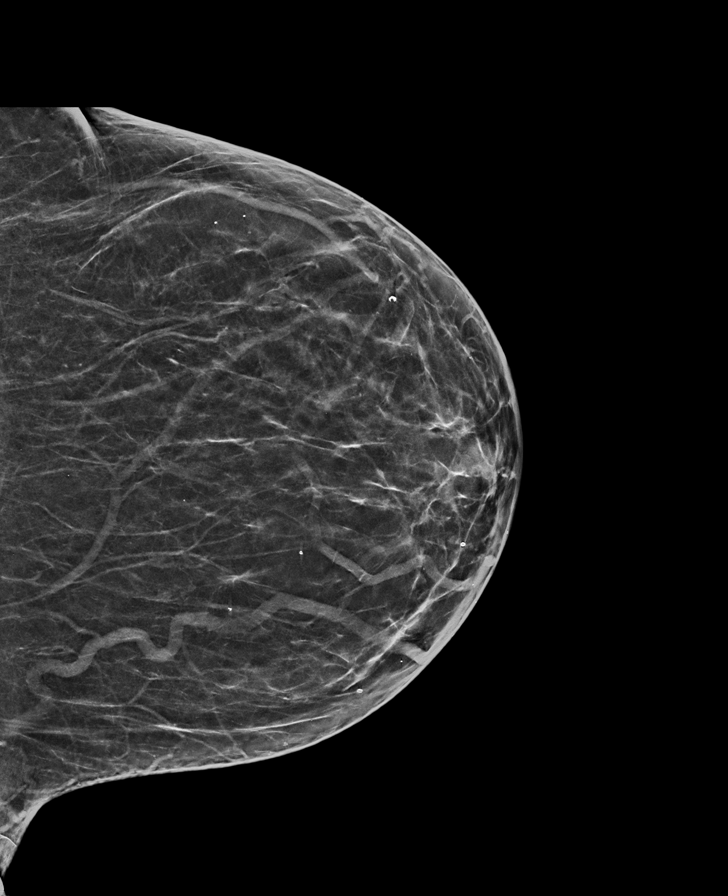

[R CC tomo · tomo slice 31/60.0]
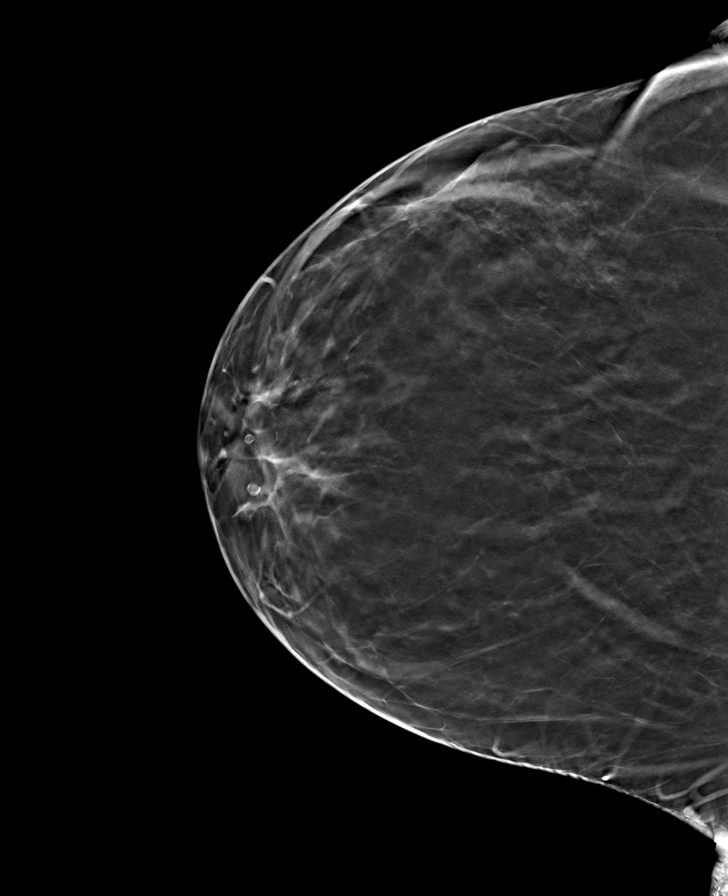

[R MLO tomo · tomo slice 33/64.0]
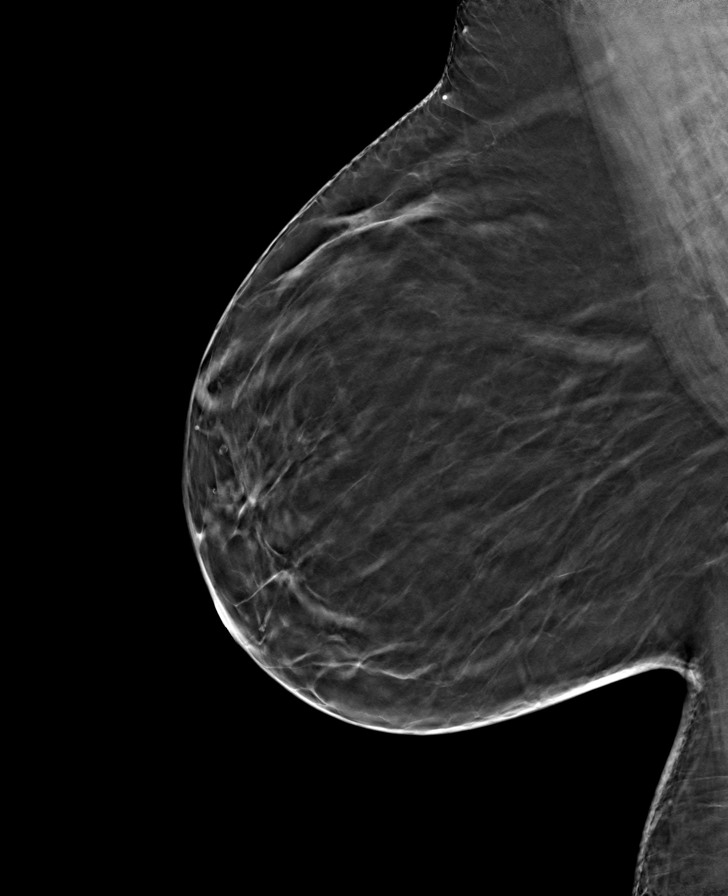

[L CC tomo · tomo slice 31/60.0]
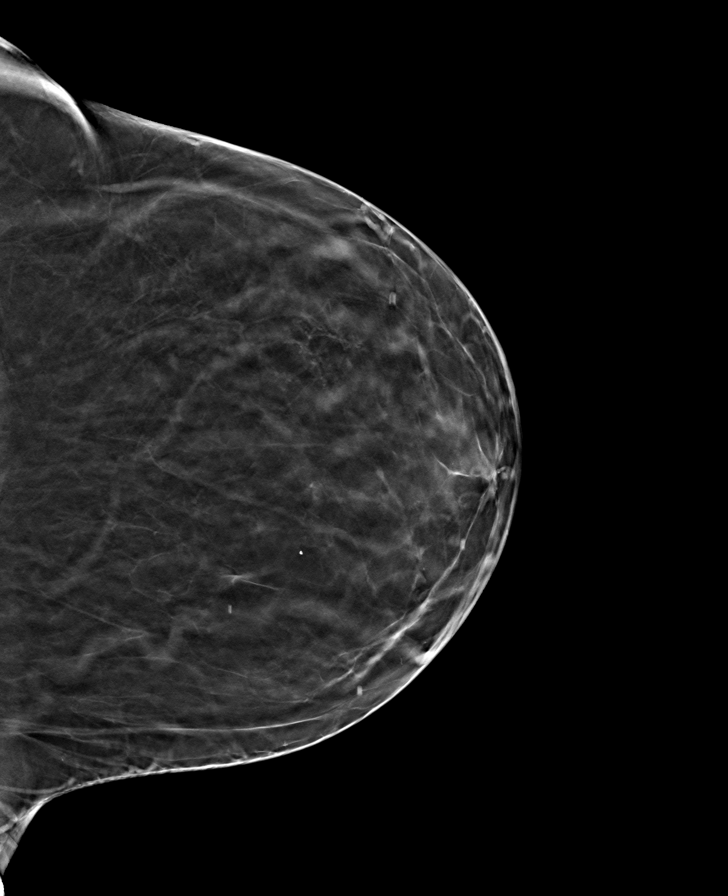

[L MLO tomo · tomo slice 34/67.0]
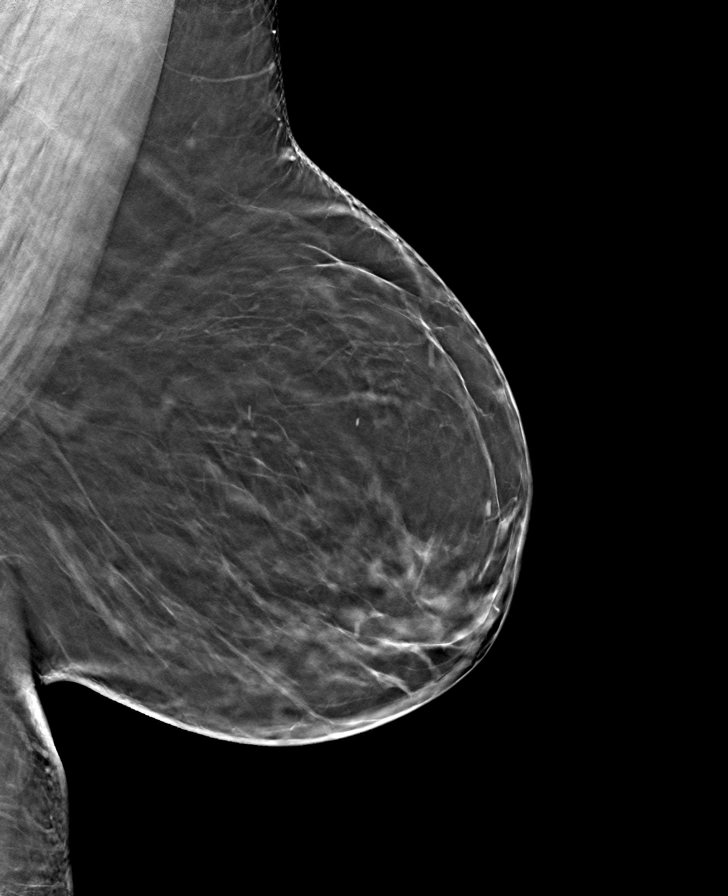

[8 of 24 positions shown; findings below may reference images not displayed]

ACR Breast Density Category b: There are scattered areas of
fibroglandular density.
FINDINGS: There are no findings suspicious for malignancy. Images were
processed with CAD.
IMPRESSION: No mammographic evidence of malignancy. A result letter of this
screening mammogram will be mailed directly to the patient.

RECOMMENDATION:
Screening mammogram in one year. (Code:[TQ])

BI-RADS CATEGORY  1: Negative.

## 2018-03-09 MED FILL — metFORMIN HCL ER 500 MG TB2: 500 | 30 days supply | Qty: 60 | Fill #2

## 2018-03-09 MED FILL — PANTOPRAZOLE SOD DR 40 MG T: 40 | 90 days supply | Qty: 90 | Fill #3

## 2018-03-09 MED FILL — ALPRAZolam 1 MG TABS: 1 | 90 days supply | Qty: 180 | Fill #1

## 2018-03-09 MED FILL — VENLAFAXINE HCL ER 75 MG CA: 75 | 90 days supply | Qty: 180 | Fill #2

## 2018-03-10 DIAGNOSIS — Z79891 Long term (current) use of opiate analgesic: Secondary | ICD-10-CM | POA: Diagnosis not present

## 2018-03-18 MED FILL — HYDROCODON-APAP 10-325: 10-325 | 30 days supply | Qty: 120 | Fill #0

## 2018-04-06 DIAGNOSIS — H5203 Hypermetropia, bilateral: Secondary | ICD-10-CM | POA: Diagnosis not present

## 2018-04-19 MED FILL — metFORMIN HCL ER 500 MG TB2: 500 | 30 days supply | Qty: 60 | Fill #3

## 2018-04-19 MED FILL — HYDROCODON-APAP 10-325: 10-325 | 30 days supply | Qty: 120 | Fill #0

## 2018-05-17 DIAGNOSIS — E119 Type 2 diabetes mellitus without complications: Secondary | ICD-10-CM | POA: Diagnosis not present

## 2018-05-17 DIAGNOSIS — G4733 Obstructive sleep apnea (adult) (pediatric): Secondary | ICD-10-CM | POA: Diagnosis not present

## 2018-05-17 DIAGNOSIS — J449 Chronic obstructive pulmonary disease, unspecified: Secondary | ICD-10-CM | POA: Diagnosis not present

## 2018-05-17 DIAGNOSIS — C210 Malignant neoplasm of anus, unspecified: Secondary | ICD-10-CM | POA: Diagnosis not present

## 2018-05-17 MED FILL — AMOXICILLIN 500 MG CAPSULE: 500 | 10 days supply | Qty: 30 | Fill #0

## 2018-05-17 MED FILL — metFORMIN HCL 500 MG TABS: 500 | 90 days supply | Qty: 180 | Fill #0

## 2018-05-23 DIAGNOSIS — E119 Type 2 diabetes mellitus without complications: Secondary | ICD-10-CM | POA: Diagnosis not present

## 2018-05-23 DIAGNOSIS — C210 Malignant neoplasm of anus, unspecified: Secondary | ICD-10-CM | POA: Diagnosis not present

## 2018-05-23 DIAGNOSIS — J449 Chronic obstructive pulmonary disease, unspecified: Secondary | ICD-10-CM | POA: Diagnosis not present

## 2018-05-23 DIAGNOSIS — G4733 Obstructive sleep apnea (adult) (pediatric): Secondary | ICD-10-CM | POA: Diagnosis not present

## 2018-06-09 MED FILL — ALPRAZolam 1 MG TABS: 1 | 90 days supply | Qty: 180 | Fill #0

## 2018-06-09 MED FILL — ROSUVASTATIN CALCIUM 10 MG: 10 | 90 days supply | Qty: 90 | Fill #0

## 2018-06-09 MED FILL — PANTOPRAZOLE SOD DR 40 MG T: 40 | 90 days supply | Qty: 90 | Fill #0

## 2018-06-09 MED FILL — VENLAFAXINE HCL ER 75 MG CA: 75 | 90 days supply | Qty: 180 | Fill #0

## 2018-06-14 MED FILL — HYDROCODON-APAP 10-325: 10-325 | 30 days supply | Qty: 120 | Fill #0

## 2018-07-12 MED FILL — HYDROCODON-APAP 10-325: 10-325 | 30 days supply | Qty: 120 | Fill #0

## 2018-08-03 MED FILL — AZITHROMYCIN 250 MG TABLET: 250 | 5 days supply | Qty: 6 | Fill #0

## 2018-08-10 MED FILL — HYDROCODON-APAP 10-325: 10-325 | 30 days supply | Qty: 120 | Fill #0

## 2018-08-15 MED FILL — metFORMIN HCL ER 500 MG TB2: 500 | 90 days supply | Qty: 180 | Fill #1

## 2018-08-20 MED FILL — ROSUVASTATIN CALCIUM 10 MG: 10 | 90 days supply | Qty: 90 | Fill #1

## 2018-09-06 MED FILL — ALPRAZolam 1 MG TABS: 1 | 90 days supply | Qty: 180 | Fill #1

## 2018-09-06 MED FILL — VENLAFAXINE HCL ER 75 MG CA: 75 | 90 days supply | Qty: 180 | Fill #1

## 2018-09-06 MED FILL — PANTOPRAZOLE SOD DR 40 MG T: 40 | 90 days supply | Qty: 90 | Fill #1

## 2018-09-16 MED FILL — HYDROCODON-APAP 10-325: 10-325 | 30 days supply | Qty: 120 | Fill #0

## 2018-09-22 DIAGNOSIS — K210 Gastro-esophageal reflux disease with esophagitis: Secondary | ICD-10-CM | POA: Diagnosis not present

## 2018-09-22 DIAGNOSIS — F419 Anxiety disorder, unspecified: Secondary | ICD-10-CM | POA: Diagnosis not present

## 2018-09-22 DIAGNOSIS — E039 Hypothyroidism, unspecified: Secondary | ICD-10-CM | POA: Diagnosis not present

## 2018-09-22 DIAGNOSIS — J449 Chronic obstructive pulmonary disease, unspecified: Secondary | ICD-10-CM | POA: Diagnosis not present

## 2018-09-26 MED FILL — AMOXICILLIN 500 MG CAPSULE: 500 | 10 days supply | Qty: 30 | Fill #1

## 2018-09-26 MED FILL — AZITHROMYCIN 250 MG TABLET: 250 | 5 days supply | Qty: 6 | Fill #1

## 2018-10-14 MED FILL — HYDROCODON-APAP 10-325: 10-325 | 30 days supply | Qty: 120 | Fill #0

## 2018-11-07 MED FILL — METFORMIN HCL ER 500 MG TB2: 500 | 30 days supply | Qty: 60 | Fill #2

## 2018-11-08 MED FILL — VENLAFAXINE HCL ER 75 MG CA: 75 | 90 days supply | Qty: 270 | Fill #0

## 2018-11-12 MED FILL — HYDROCODON-APAP 10-325: 10-325 | 30 days supply | Qty: 120 | Fill #0

## 2018-11-16 MED FILL — ROSUVASTATIN CALCIUM 10 MG: 10 | 90 days supply | Qty: 90 | Fill #2

## 2018-11-30 MED FILL — AZITHROMYCIN 250 MG TABS: 250 | 5 days supply | Qty: 6 | Fill #2

## 2018-11-30 MED FILL — PANTOPRAZOLE SOD DR 40 MG T: 40 | 90 days supply | Qty: 90 | Fill #2

## 2018-12-01 MED FILL — METFORMIN HCL ER 500 MG TB2: 500 | 30 days supply | Qty: 60 | Fill #3

## 2018-12-01 MED FILL — ALPRAZolam 1 MG TABS: 1 | 90 days supply | Qty: 180 | Fill #0

## 2018-12-08 MED FILL — HYDROCODON-APAP 10-325: 10-325 | 30 days supply | Qty: 120 | Fill #0

## 2018-12-20 DIAGNOSIS — R739 Hyperglycemia, unspecified: Secondary | ICD-10-CM | POA: Diagnosis not present

## 2018-12-20 DIAGNOSIS — R609 Edema, unspecified: Secondary | ICD-10-CM | POA: Diagnosis not present

## 2018-12-20 DIAGNOSIS — J449 Chronic obstructive pulmonary disease, unspecified: Secondary | ICD-10-CM | POA: Diagnosis not present

## 2018-12-20 DIAGNOSIS — E785 Hyperlipidemia, unspecified: Secondary | ICD-10-CM | POA: Diagnosis not present

## 2018-12-27 DIAGNOSIS — E039 Hypothyroidism, unspecified: Secondary | ICD-10-CM | POA: Diagnosis not present

## 2018-12-27 DIAGNOSIS — E119 Type 2 diabetes mellitus without complications: Secondary | ICD-10-CM | POA: Diagnosis not present

## 2018-12-27 DIAGNOSIS — E785 Hyperlipidemia, unspecified: Secondary | ICD-10-CM | POA: Diagnosis not present

## 2018-12-27 DIAGNOSIS — C210 Malignant neoplasm of anus, unspecified: Secondary | ICD-10-CM | POA: Diagnosis not present

## 2018-12-27 MED FILL — LEVOTHYROXINE 50 MCG TABLET: 50 | 30 days supply | Qty: 30 | Fill #0

## 2018-12-27 MED FILL — metFORMIN HCL ER 500 MG TB2: 500 | 30 days supply | Qty: 60 | Fill #4

## 2019-01-04 MED FILL — HYDROCODON-APAP 10-325: 10-325 | 30 days supply | Qty: 120 | Fill #0

## 2019-01-24 MED FILL — LEVOTHYROXINE 50 MCG TABLET: 50 | 30 days supply | Qty: 30 | Fill #1

## 2019-01-24 MED FILL — metFORMIN HCL ER 500 MG TB2: 500 | 30 days supply | Qty: 60 | Fill #5

## 2019-01-31 MED FILL — HYDROCODON-APAP 10-325: 10-325 | 30 days supply | Qty: 120 | Fill #0

## 2019-01-31 MED FILL — VENLAFAXINE HCL ER 75 MG CA: 75 | 90 days supply | Qty: 270 | Fill #1

## 2019-02-16 MED FILL — ROSUVASTATIN CALCIUM 10 MG: 10 | 90 days supply | Qty: 90 | Fill #3

## 2019-02-20 MED FILL — LEVOTHYROXINE 50 MCG TABLET: 50 | 30 days supply | Qty: 30 | Fill #2

## 2019-02-27 MED FILL — PANTOPRAZOLE SOD DR 40 MG T: 40 | 90 days supply | Qty: 90 | Fill #3

## 2019-02-27 MED FILL — ALPRAZolam 1 MG TABS: 1 | 90 days supply | Qty: 180 | Fill #1

## 2019-02-27 MED FILL — HYDROCODON-APAP 10-325: 10-325 | 30 days supply | Qty: 120 | Fill #0

## 2019-02-28 ENCOUNTER — Other Ambulatory Visit (HOSPITAL_COMMUNITY): Payer: Self-pay | Admitting: Obstetrics & Gynecology

## 2019-02-28 DIAGNOSIS — Z1231 Encounter for screening mammogram for malignant neoplasm of breast: Secondary | ICD-10-CM

## 2019-03-16 MED FILL — LEVOTHYROXINE 50 MCG TABLET: 50 | 30 days supply | Qty: 30 | Fill #3

## 2019-03-24 ENCOUNTER — Ambulatory Visit (HOSPITAL_COMMUNITY)
Admission: RE | Admit: 2019-03-24 | Discharge: 2019-03-24 | Disposition: A | Discharge status: Home / Self Care | Payer: 59 | Source: Ambulatory Visit | Attending: Obstetrics & Gynecology | Admitting: Obstetrics & Gynecology

## 2019-03-24 ENCOUNTER — Encounter: Payer: Self-pay | Admitting: Obstetrics & Gynecology

## 2019-03-24 ENCOUNTER — Other Ambulatory Visit (HOSPITAL_COMMUNITY)
Admission: RE | Admit: 2019-03-24 | Discharge: 2019-03-24 | Disposition: A | Discharge status: Home / Self Care | Payer: 59 | Source: Ambulatory Visit | Attending: Obstetrics & Gynecology | Admitting: Obstetrics & Gynecology

## 2019-03-24 ENCOUNTER — Other Ambulatory Visit: Payer: Self-pay

## 2019-03-24 ENCOUNTER — Ambulatory Visit (INDEPENDENT_AMBULATORY_CARE_PROVIDER_SITE_OTHER): Payer: 59 | Admitting: Obstetrics & Gynecology

## 2019-03-24 VITALS — BP 132/80 | HR 80 | Temp 97.2°F | Resp 12 | Ht 64.5 in | Wt 256.0 lb

## 2019-03-24 DIAGNOSIS — Z124 Encounter for screening for malignant neoplasm of cervix: Secondary | ICD-10-CM | POA: Insufficient documentation

## 2019-03-24 DIAGNOSIS — Z1231 Encounter for screening mammogram for malignant neoplasm of breast: Secondary | ICD-10-CM | POA: Diagnosis not present

## 2019-03-24 DIAGNOSIS — Z01411 Encounter for gynecological examination (general) (routine) with abnormal findings: Secondary | ICD-10-CM | POA: Diagnosis not present

## 2019-03-24 IMAGING — MG DIGITAL SCREENING BILAT W/ TOMO W/ CAD
8 series · 8 of 24 positions shown · non-contrast
Comparison: Previous exam(s).

CLINICAL DATA: Screening.

EXAM:
DIGITAL SCREENING BILATERAL MAMMOGRAM WITH TOMO AND CAD

[L CC synth-2D]
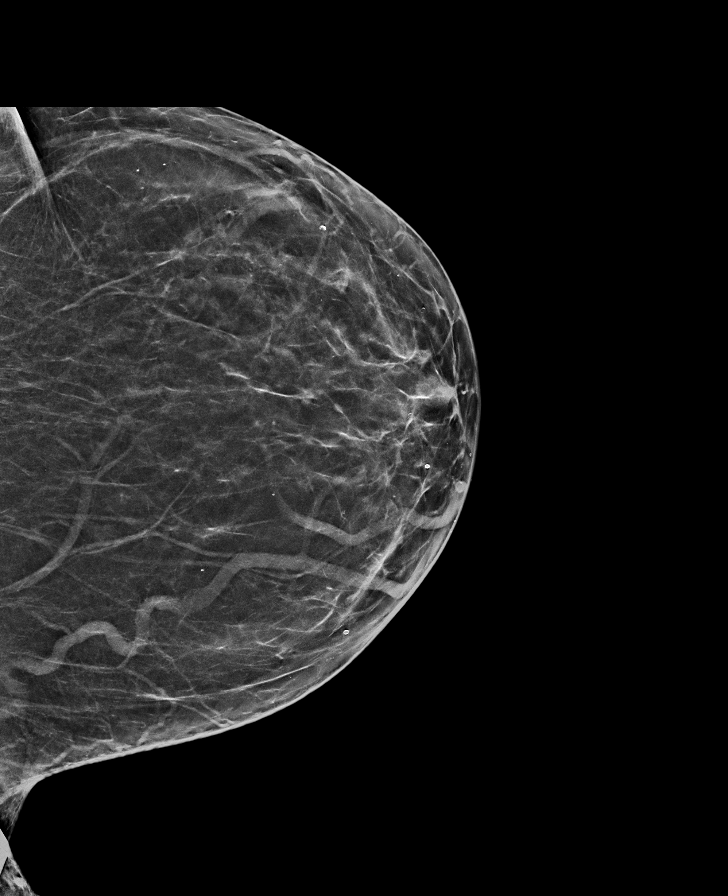

[L MLO synth-2D]
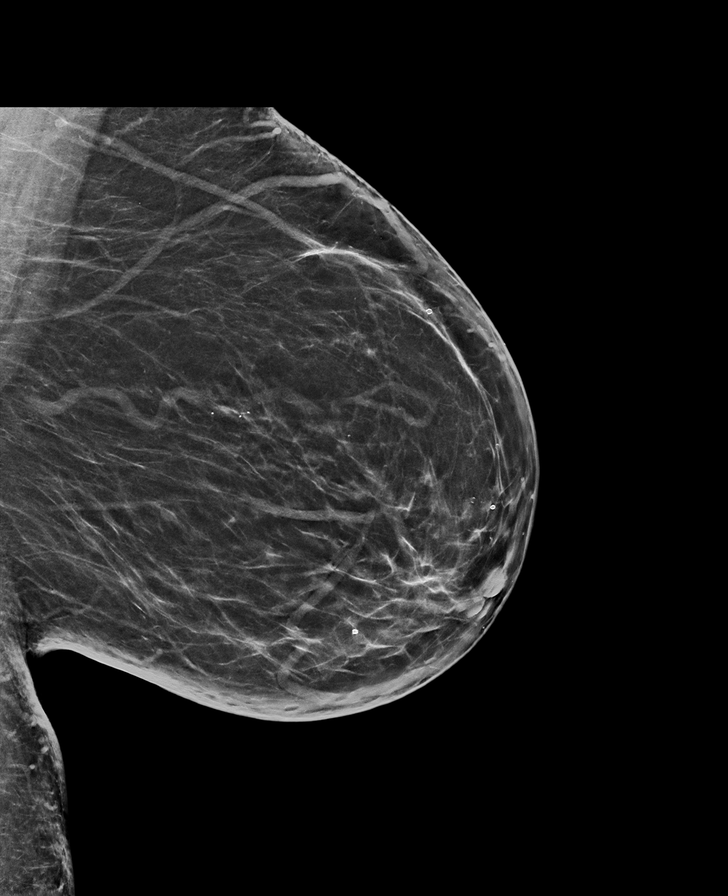

[R CC synth-2D]
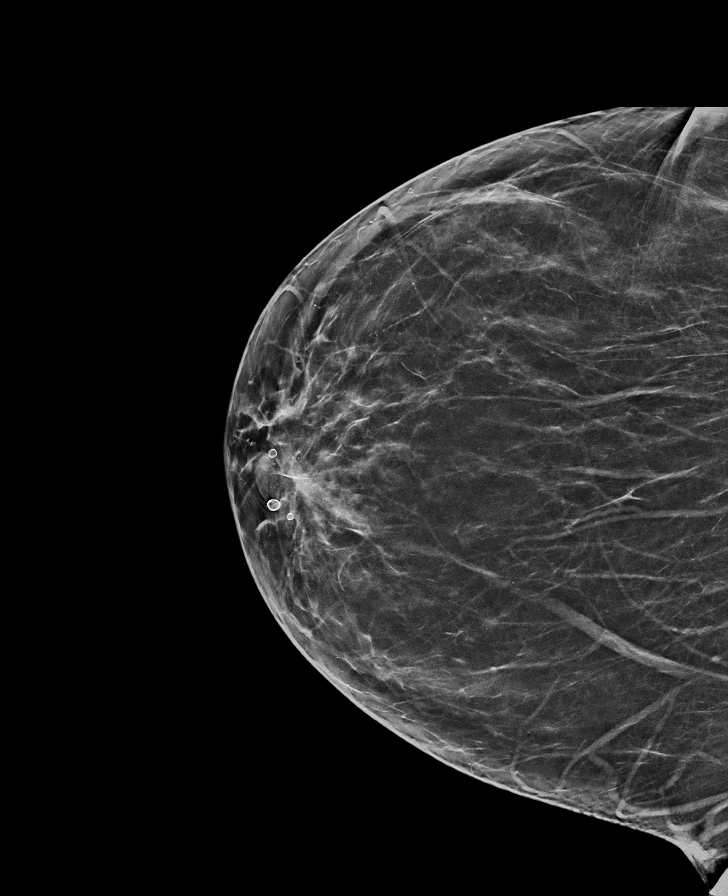

[R MLO synth-2D]
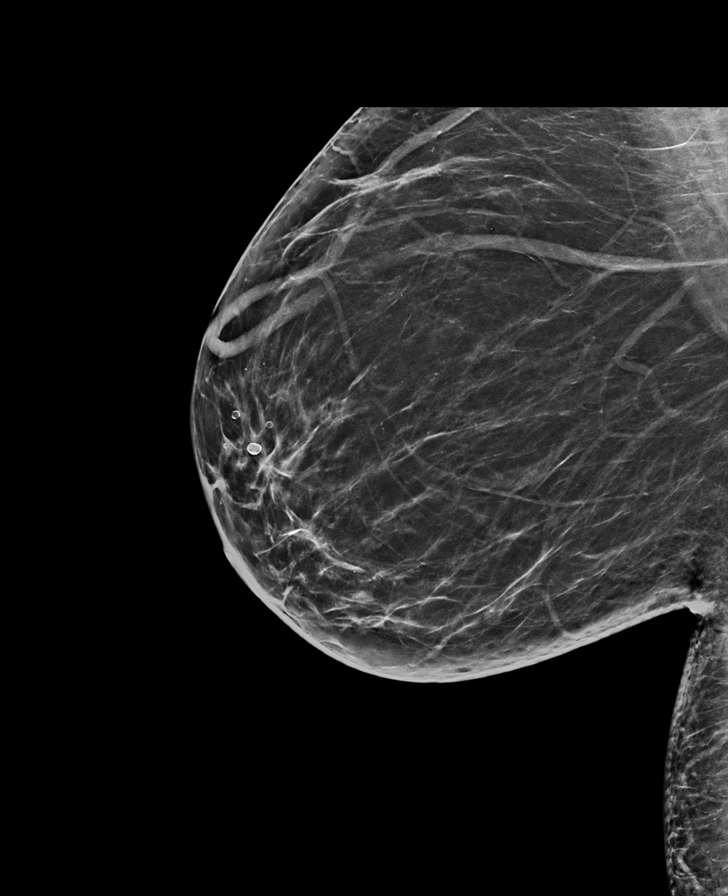

[R CC tomo · tomo slice 33/65.0]
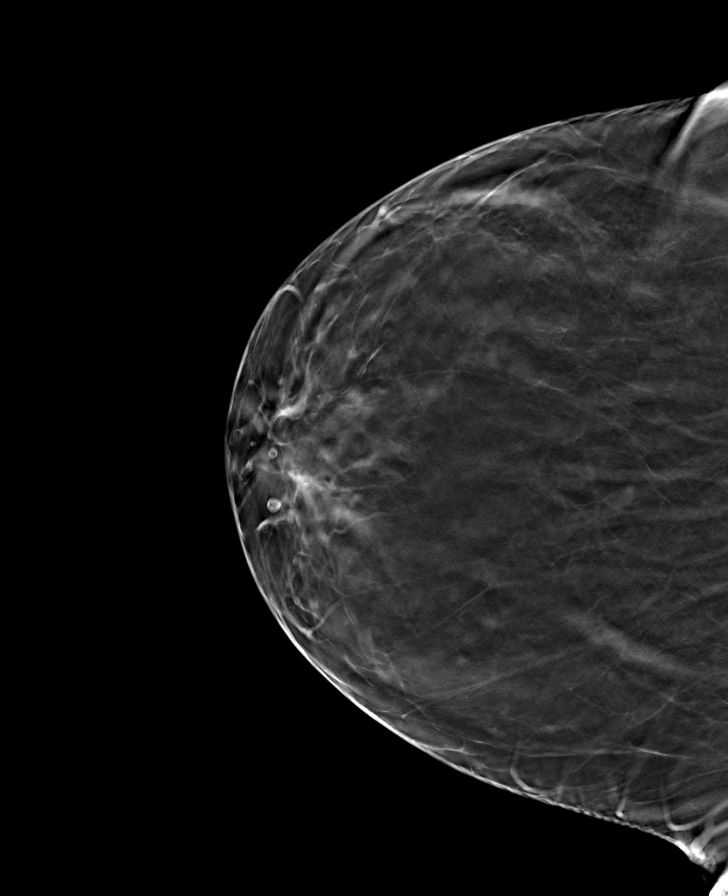

[R MLO tomo · tomo slice 35/70.0]
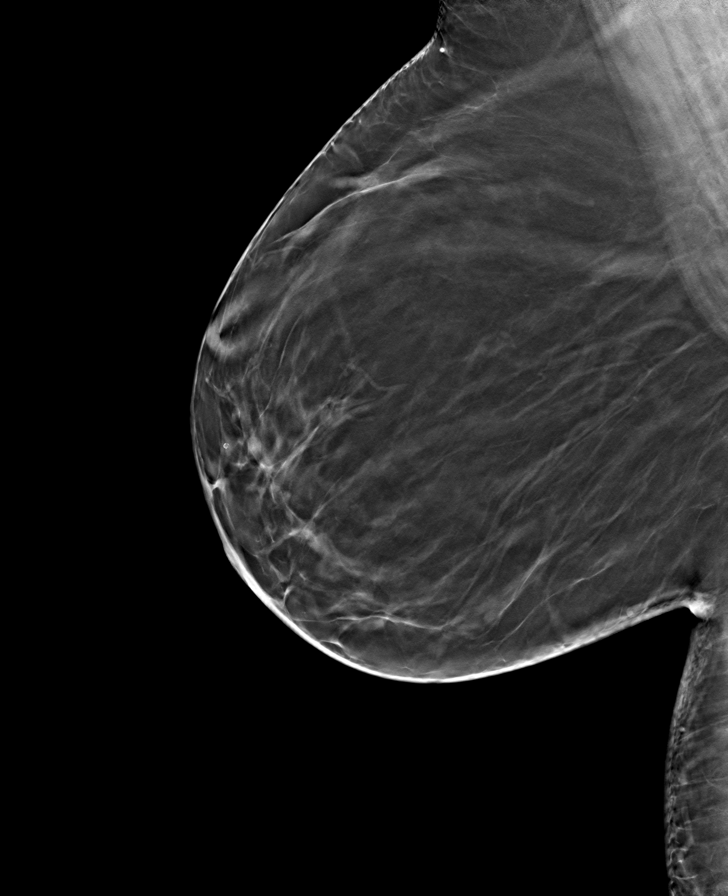

[L MLO tomo · tomo slice 36/71.0]
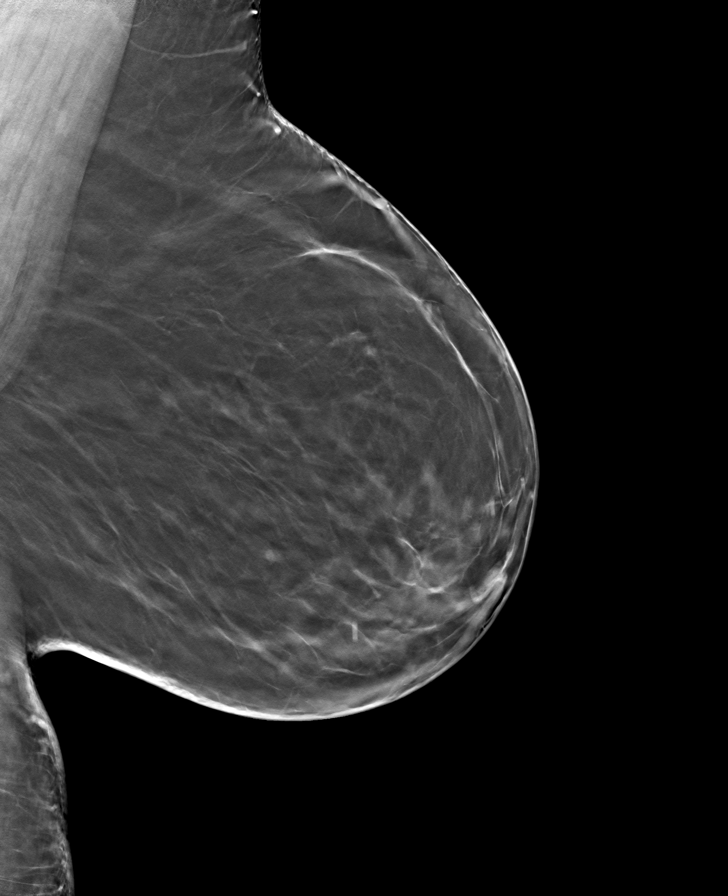

[L CC tomo · tomo slice 33/64.0]
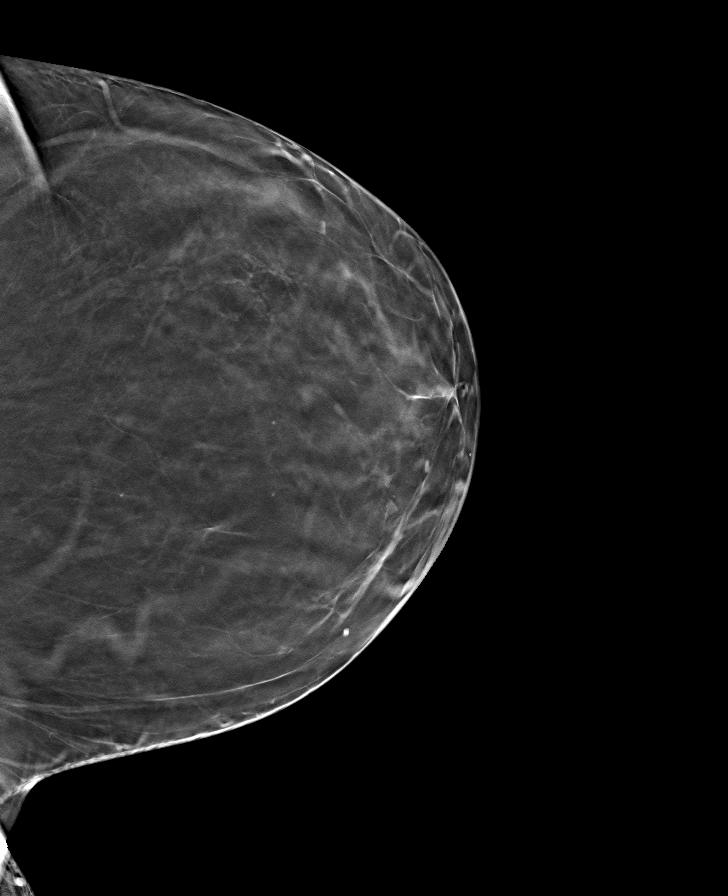

[8 of 24 positions shown; findings below may reference images not displayed]

ACR Breast Density Category b: There are scattered areas of
fibroglandular density.
FINDINGS: There are no findings suspicious for malignancy. Images were
processed with CAD.
IMPRESSION: No mammographic evidence of malignancy. A result letter of this
screening mammogram will be mailed directly to the patient.

RECOMMENDATION:
Screening mammogram in one year. (Code:[TQ])

BI-RADS CATEGORY  1: Negative.

## 2019-03-24 NOTE — Progress Notes (Signed)
48 y.o. G78P1001 Divorced White or Caucasian female here for annual exam.  Doing well.    Last visit with Dr. Marcello Moores was 1/19.   Has not had follow up.    Pulmonology: Dr. Luan Pulling in [place].  He is retiring in December.  Has appt on November 11th.  She will have repeat thyroid level done at that time.  Last HbA1C was 6.4.    No LMP recorded. Patient has had a hysterectomy.          Sexually active: Yes.    The current method of family planning is status post hysterectomy.    Exercising: No.  The patient does not participate in regular exercise at present. Smoker:  no  Health Maintenance: Pap:  06/21/15 neg. HR HPV:neg              04/09/14 Neg History of abnormal Pap:  Yes, LEEP, LGSIL MMG:  02/23/18 BIRADS 1 negative/density b.  Has appt scheduled today. Flu:  Completed TDaP:  2012 Pneumonia vaccine(s):  Completed  Hep C testing: n/a Screening Labs: PCP   reports that she quit smoking about 3 years ago. Her smoking use included cigarettes. She has a 30.00 pack-year smoking history. She has never used smokeless tobacco. She reports that she does not drink alcohol or use drugs.  Past Medical History:  Diagnosis Date  . Anal lesion   . Anxiety   . Arthritis    Right hip  . Borderline diabetes mellitus   . Chronic pain   . COPD with asthma ([place])    hx exacerbation's    . Diabetes mellitus without complication ([place])   . GERD (gastroesophageal reflux disease)   . Hemorrhoids   . History of acute respiratory failure    06/ 2015  and 12/ 2016  CAP and Asthma exacerbation--  vented both times (ARDS)  . History of anal dysplasia    AIN 2/ 3    . History of cervical dysplasia    CIN 2 in 2000  . History of hypothyroidism   . History of pneumonia    hx Required ventilatory support  . History of TIA (transient ischemic attack)    Caused by medication reaction  . Migraines   . OSA on CPAP     Past Surgical History:  Procedure Laterality Date  . CESAREAN SECTION  2001  . CO2  LASER APPLICATION N/A [id number]   Procedure: CO2 LASER APPLICATION;  Surgeon: Leighton Ruff, MD;  Location: [facility];  Service: General;  Laterality: N/A;  . CONIZATION OF CERVIX  2000  . DILATION AND CURETTAGE OF UTERUS    . EVALUATION UNDER ANESTHESIA WITH HEMORRHOIDECTOMY N/A 06/15/2014   Procedure: EXAM UNDER ANESTHESIA WITH HEMORRHOIDECTOMY;  Surgeon: Doreen Salvage, MD;  Location: [facility];  Service: General;  Laterality: N/A;  . EXCISION OF SKIN TAG N/A 06/15/2014   Procedure: EXCISION OF SKIN TAGS;  Surgeon: Doreen Salvage, MD;  Location: [facility];  Service: General;  Laterality: N/A;  . HIGH RESOLUTION ANOSCOPY N/A 10/11/2014   Procedure: HIGH RESOLUTION ANOSCOPY WITH BIOPSY;  Surgeon: Leighton Ruff, MD;  Location[facility];  Service: General;  Laterality: N/A;  . HYSTEROSCOPY W/D&C  09/12/2007  &  2005  . RECTAL EXAM UNDER ANESTHESIA N/A 10/11/2014   Procedure: ANAL EXAM UNDER ANESTHESIA;  Surgeon: Leighton Ruff, MD;  Location: [facility];  Service: General;  Laterality: N/A;  . RECTAL EXAM UNDER ANESTHESIA N/A 12/18/2015  Procedure: ANAL EXAM UNDER ANESTHESIA  EXCISION ANAL MARGIN LESION;  Surgeon: Leighton Ruff, MD;  Location: [facility];  Service: General;  Laterality: N/A;  . ROBOTIC ASSISTED TOTAL HYSTERECTOMY N/A 10/11/2012   Procedure: ROBOTIC ASSISTED TOTAL HYSTERECTOMY;  Surgeon: Lyman Speller, MD;  Location: [facility];  Service: Gynecology;  Laterality: N/A;  . SALPINGOOPHORECTOMY Bilateral 10/11/2012   Procedure: SALPINGO OOPHORECTOMY;  Surgeon: Lyman Speller, MD;  Location: [facility];  Service: Gynecology;  Laterality: Bilateral;  . TRANSTHORACIC ECHOCARDIOGRAM  05/07/2015   lvsf vigorous, ef 70-75%/  trivial MR and TR  . VIDEO ASSISTED THORACOSCOPY (VATS)/THOROCOTOMY Right 11/04/2001   w/ Resection duplication esophageal cyst    Current Outpatient Medications  Medication Sig  Dispense Refill  . albuterol (PROVENTIL HFA;VENTOLIN HFA) 108 (90 BASE) MCG/ACT inhaler Inhale 2 puffs into the lungs every 6 (six) hours as needed. wheezing 1 Inhaler 11  . albuterol (PROVENTIL) (5 MG/ML) 0.5% nebulizer solution Take 2.5 mg by nebulization every 6 (six) hours as needed for wheezing or shortness of breath.    . ALPRAZolam (XANAX) 1 MG tablet Take 1 mg by mouth 2 (two) times daily.    [name] aspirin EC 81 MG tablet Take 81 mg by mouth daily.    [name] azithromycin (ZITHROMAX) 250 MG tablet Take 250 mg by mouth as needed.    . budesonide-formoterol (SYMBICORT) 160-4.5 MCG/ACT inhaler Inhale 2 puffs into the lungs 2 (two) times daily.    [name] dextromethorphan-guaiFENesin (MUCINEX DM) 30-600 MG 12hr tablet Take 1 tablet by mouth 2 (two) times daily.    . famotidine (PEPCID) 20 MG tablet Take 20 mg by mouth at bedtime.    [name] HYDROcodone-acetaminophen (NORCO) 10-325 MG tablet Take 1 tablet by mouth every 6 (six) hours as needed.    [name] levothyroxine (SYNTHROID) 50 MCG tablet Take 50 mcg by mouth daily before breakfast.    . lidocaine (XYLOCAINE) 2 % jelly Apply 1 application every 6 (six) hours as needed topically.  3  . loratadine (CLARITIN) 10 MG tablet Take 10 mg by mouth daily.    . magic mouthwash w/lidocaine SOLN Take 10 mLs 4 (four) times daily as needed by mouth for mouth pain (Thrush).    . metFORMIN (GLUCOPHAGE XR) 500 MG 24 hr tablet Take 1 tablet (500 mg total) by mouth 2 (two) times daily. 60 tablet 12  . Multiple Vitamins-Calcium (ONE-A-DAY WOMENS FORMULA PO) Take by mouth.    . Omega-3 1000 MG CAPS Take by mouth.    . pantoprazole (PROTONIX) 40 MG tablet Take 40 mg by mouth 2 (two) times daily. 1/2 tablet twice a day    . polyethylene glycol (MIRALAX / GLYCOLAX) packet Take 17 g by mouth daily as needed for mild constipation.    [name] PREDNISOLONE PO Take 10 mg by mouth as needed.    . rosuvastatin (CRESTOR) 10 MG tablet Take 10 mg by mouth daily.    [name] venlafaxine XR (EFFEXOR-XR) 75 MG 24  hr capsule Take 150 mg at bedtime by mouth.  1   No current facility-administered medications for this visit.     Family History  Problem Relation Age of Onset  . Breast cancer Brother 15  . Breast cancer Maternal Aunt   . Depression Mother   . Anesthesia problems Mother        post-op N/V  . Alcohol abuse Father   . Alcohol abuse Cousin     Review of Systems  Constitutional: Negative.   HENT: Negative.  Eyes: Negative.   Respiratory: Negative.   Cardiovascular: Negative.   Gastrointestinal: Negative.   Endocrine: Negative.   Genitourinary: Negative.   Musculoskeletal: Negative.   Skin: Negative.   Allergic/Immunologic: Negative.   Neurological: Negative.   Hematological: Negative.   Psychiatric/Behavioral: Negative.     Exam:   BP 132/80 (BP Location: Right Arm, Patient Position: Sitting, Cuff Size: Large)   Pulse 80   Temp (!) 97.2 F (36.2 C) (Temporal)   Resp 12   Ht 5' 4.5" (1.638 m)   Wt 256 lb (116.1 kg)   BMI 43.26 kg/m    Height: 5' 4.5" (163.8 cm)  Ht Readings from Last 3 Encounters:  03/24/19 5' 4.5" (1.638 m)  12/23/17 5\' 5"  (1.651 m)  03/24/17 5\' 4"  (1.626 m)    General appearance: alert, cooperative and appears stated age Head: Normocephalic, without obvious abnormality, atraumatic Neck: no adenopathy, supple, symmetrical, trachea midline and thyroid normal to inspection and palpation Lungs: clear to auscultation bilaterally Breasts: normal appearance, no masses or tenderness Heart: regular rate and rhythm Abdomen: soft, non-tender; bowel sounds normal; no masses,  no organomegaly Extremities: extremities normal, atraumatic, no cyanosis or edema Skin: Skin color, texture, turgor normal. No rashes or lesions Lymph nodes: Cervical, supraclavicular, and axillary nodes normal. No abnormal inguinal nodes palpated Neurologic: Grossly normal   Pelvic: External genitalia:  no lesions              Urethra:  normal appearing urethra with no masses,  tenderness or lesions              Bartholins and Skenes: normal                 Vagina: normal appearing vagina with normal color and discharge, no lesions              Cervix: absent              Pap taken: Yes.   Bimanual Exam:  Uterus:  uterus absent              Adnexa: no mass, fullness, tenderness               Rectovaginal: Confirms               Anus:  normal sphincter tone, no lesions  Chaperone was present for exam.  A:  Well Woman with normal exam H/o robotic TLH/bilateral salpingectomy 5/14 due to hx of recurrent abnormal pap smears and h/o LEEP about 20 years ago with high grade cervical dysplasia  H/o AIN 2/3 Asthma/COPD with multiple hospitalizations Former smoker, quit 2016 Hypothyroidism H/o brother with breast cancer.  Pt with negative genetic testing.  Lifetime risk for pt is 25%  P:   Mammogram guidelines reviewed.  Has this scheduled for this afternoon.  Yearly breast MRI recommended to pt.  Concerned about cost and does not want scheduled at this time. pap smear with HR HPV obtained today Pt highly encouraged to see Dr. Marcello Moores for follow up.  States she promises to go for follow up. Lab work will be done in early November with Dr. Luan Pulling Colonoscopy at age 66 Additional genetic testing discussed.  Referral to genetics offered.  Declined today.  Aware to call me if desires referral. Return annually or prn

## 2019-03-29 DIAGNOSIS — E119 Type 2 diabetes mellitus without complications: Secondary | ICD-10-CM | POA: Diagnosis not present

## 2019-03-29 DIAGNOSIS — J449 Chronic obstructive pulmonary disease, unspecified: Secondary | ICD-10-CM | POA: Diagnosis not present

## 2019-03-29 DIAGNOSIS — R079 Chest pain, unspecified: Secondary | ICD-10-CM | POA: Diagnosis not present

## 2019-03-29 DIAGNOSIS — G4733 Obstructive sleep apnea (adult) (pediatric): Secondary | ICD-10-CM | POA: Diagnosis not present

## 2019-03-29 MED FILL — GABAPENTIN 300 MG CAPSULE: 300 | 90 days supply | Qty: 90 | Fill #0

## 2019-03-29 MED FILL — AZITHROMYCIN 250 MG TABLET: 250 | 5 days supply | Qty: 6 | Fill #0

## 2019-03-29 MED FILL — HYDROCODON-APAP 10-325: 10-325 | 30 days supply | Qty: 120 | Fill #0

## 2019-03-30 LAB — CYTOLOGY - PAP
Comment: NEGATIVE
Diagnosis: NEGATIVE
High risk HPV: NEGATIVE

## 2019-04-11 MED FILL — LEVOTHYROXINE 50 MCG TABLET: 50 | 30 days supply | Qty: 30 | Fill #4

## 2019-04-27 MED FILL — HYDROCODON-APAP 10-325: 10-325 | 30 days supply | Qty: 120 | Fill #0

## 2019-05-03 MED FILL — AZITHROMYCIN 250 MG TABLET: 250 | 5 days supply | Qty: 6 | Fill #1

## 2019-05-03 MED FILL — VENLAFAXINE HCL ER 75 MG CA: 75 | 90 days supply | Qty: 270 | Fill #2

## 2019-05-08 DIAGNOSIS — H5203 Hypermetropia, bilateral: Secondary | ICD-10-CM | POA: Diagnosis not present

## 2019-05-08 DIAGNOSIS — Z135 Encounter for screening for eye and ear disorders: Secondary | ICD-10-CM | POA: Diagnosis not present

## 2019-05-08 DIAGNOSIS — H524 Presbyopia: Secondary | ICD-10-CM | POA: Diagnosis not present

## 2019-05-08 DIAGNOSIS — E119 Type 2 diabetes mellitus without complications: Secondary | ICD-10-CM | POA: Diagnosis not present

## 2019-05-09 MED FILL — metFORMIN HCL ER 500 MG TB2: 500 | 30 days supply | Qty: 60 | Fill #6

## 2019-05-09 MED FILL — LEVOTHYROXINE 50 MCG TABLET: 50 | 30 days supply | Qty: 30 | Fill #5

## 2019-05-15 DIAGNOSIS — Z1159 Encounter for screening for other viral diseases: Secondary | ICD-10-CM | POA: Diagnosis not present

## 2019-05-15 DIAGNOSIS — Z79891 Long term (current) use of opiate analgesic: Secondary | ICD-10-CM | POA: Diagnosis not present

## 2019-05-15 DIAGNOSIS — R209 Unspecified disturbances of skin sensation: Secondary | ICD-10-CM | POA: Diagnosis not present

## 2019-05-15 DIAGNOSIS — E559 Vitamin D deficiency, unspecified: Secondary | ICD-10-CM | POA: Diagnosis not present

## 2019-05-15 MED FILL — HYDROCODON-APAP 5-325: 5-325 | 30 days supply | Qty: 120 | Fill #0

## 2019-05-15 MED FILL — REGENECARE HA 2 % GEL: 2 | 30 days supply | Qty: 85 | Fill #0

## 2019-05-15 MED FILL — ROSUVASTATIN CALCIUM 10 MG: 10 | 90 days supply | Qty: 90 | Fill #0

## 2019-05-15 MED FILL — PREGABALIN 75 MG CAPS: 75 | 30 days supply | Qty: 30 | Fill #0

## 2019-05-15 NOTE — Progress Notes (Signed)
CARDIOLOGY CONSULT NOTE       Patient ID: Denise Ray MRN: [id number] DOB/AGE: Nov 18, 1970 48 y.o.  Admit date: (Not on file) Referring Physician: Megan Salon GYN  Primary Physician: Emelia Loron, NP Primary Cardiologist: New Reason for Consultation: CAD Risk  Active Problems:   * No active hospital problems. *   HPI:  48 y.o. previous smoker 30 pack year quit 3 years ago COPD followed by Dr Luan Pulling Also with OSA using CPAP. She is post hysterectomy in 2014 for recurrent abnormal PAP smears. Last CXR done 03/24/17 with cardiomegaly and no active disease.   She was on vent for respiratory failure 5 years ago at [facility] and 3 years ago at [facility] She started back smoking 2 years ago. She works as a Pharmacologist at [employer] She is not going to get covid vaccine Long discussion about utility of this  History of murmur when pregnant but went away. Sedentary Some exertional dyspnea  ROS All other systems reviewed and negative except as noted above  Past Medical History:  Diagnosis Date  . Anal lesion   . Anxiety   . Arthritis    Right hip  . Borderline diabetes mellitus   . Chronic pain   . COPD with asthma ([place])    hx exacerbation's    . Diabetes mellitus without complication ([place])   . GERD (gastroesophageal reflux disease)   . Hemorrhoids   . History of acute respiratory failure    06/ 2015  and 12/ 2016  CAP and Asthma exacerbation--  vented both times ([facility])  . History of anal dysplasia    AIN 2/ 3    . History of cervical dysplasia    CIN 2 in 2000  . History of hypothyroidism   . History of pneumonia    hx Required ventilatory support  . History of TIA (transient ischemic attack)    Caused by medication reaction  . Migraines   . OSA on CPAP     Family History  Problem Relation Age of Onset  . Breast cancer Brother 15  . Breast cancer Maternal Aunt   . Depression Mother   . Anesthesia problems Mother        post-op N/V  . Alcohol abuse Father   . Alcohol abuse  Cousin     Social History   Socioeconomic History  . Marital status: Divorced    Spouse name: Not on file  . Number of children: 1  . Years of education: Not on file  . Highest education level: Not on file  Occupational History  . Occupation: Microbiologist: [employer]  Tobacco Use  . Smoking status: Former Smoker    Packs/day: 1.00    Years: 30.00    Pack years: 30.00    Types: Cigarettes    Quit date: 05/06/2015    Years since quitting: 4.0  . Smokeless tobacco: Never Used  Substance and Sexual Activity  . Alcohol use: No    Alcohol/week: 0.0 standard drinks    Comment: Rarely  . Drug use: No  . Sexual activity: Yes    Partners: Male    Birth control/protection: Surgical    Comment: Hysterectomy  Other Topics Concern  . Not on file  Social History Narrative   Divorced.  Mother and son live with her.   Social Determinants of Health   Financial Resource Strain:   . Difficulty of Paying Living Expenses: Not on file  Food  Insecurity:   . Worried About Charity fundraiser in the Last Year: Not on file  . Ran Out of Food in the Last Year: Not on file  Transportation Needs:   . Lack of Transportation (Medical): Not on file  . Lack of Transportation (Non-Medical): Not on file  Physical Activity:   . Days of Exercise per Week: Not on file  . Minutes of Exercise per Session: Not on file  Stress:   . Feeling of Stress : Not on file  Social Connections:   . Frequency of Communication with Friends and Family: Not on file  . Frequency of Social Gatherings with Friends and Family: Not on file  . Attends Religious Services: Not on file  . Active Member of Clubs or Organizations: Not on file  . Attends Archivist Meetings: Not on file  . Marital Status: Not on file  Intimate Partner Violence:   . Fear of Current or Ex-Partner: Not on file  . Emotionally Abused: Not on file  . Physically Abused: Not on file  . Sexually Abused:  Not on file    Past Surgical History:  Procedure Laterality Date  . CESAREAN SECTION  2001  . CO2 LASER APPLICATION N/A [id number]   Procedure: CO2 LASER APPLICATION;  Surgeon: Leighton Ruff, MD;  Location: [place];  Service: General;  Laterality: N/A;  . CONIZATION OF CERVIX  2000  . DILATION AND CURETTAGE OF UTERUS    . EVALUATION UNDER ANESTHESIA WITH HEMORRHOIDECTOMY N/A 06/15/2014   Procedure: EXAM UNDER ANESTHESIA WITH HEMORRHOIDECTOMY;  Surgeon: Doreen Salvage, MD;  Location: [place];  Service: General;  Laterality: N/A;  . EXCISION OF SKIN TAG N/A 06/15/2014   Procedure: EXCISION OF SKIN TAGS;  Surgeon: Doreen Salvage, MD;  Location: [place];  Service: General;  Laterality: N/A;  . HIGH RESOLUTION ANOSCOPY N/A 10/11/2014   Procedure: HIGH RESOLUTION ANOSCOPY WITH BIOPSY;  Surgeon: Leighton Ruff, MD;  Location: [facility];  Service: General;  Laterality: N/A;  . HYSTEROSCOPY WITH D & C  09/12/2007  &  2005  . RECTAL EXAM UNDER ANESTHESIA N/A 10/11/2014   Procedure: ANAL EXAM UNDER ANESTHESIA;  Surgeon: Leighton Ruff, MD;  Location: [place];  Service: General;  Laterality: N/A;  . RECTAL EXAM UNDER ANESTHESIA N/A 12/18/2015   Procedure: ANAL EXAM UNDER ANESTHESIA  EXCISION ANAL MARGIN LESION;  Surgeon: Leighton Ruff, MD;  Location: [place];  Service: General;  Laterality: N/A;  . ROBOTIC ASSISTED TOTAL HYSTERECTOMY N/A 10/11/2012   Procedure: ROBOTIC ASSISTED TOTAL HYSTERECTOMY;  Surgeon: Lyman Speller, MD;  Location: [facility];  Service: Gynecology;  Laterality: N/A;  . SALPINGOOPHORECTOMY Bilateral 10/11/2012   Procedure: SALPINGO OOPHORECTOMY;  Surgeon: Lyman Speller, MD;  Location: [facility];  Service: Gynecology;  Laterality: Bilateral;  . TRANSTHORACIC ECHOCARDIOGRAM  05/07/2015   lvsf vigorous, ef 70-75%/  trivial MR and TR  . VIDEO ASSISTED THORACOSCOPY (VATS)/THOROCOTOMY Right  11/04/2001   w/ Resection duplication esophageal cyst        Physical Exam: Blood pressure 131/78, pulse 86, temperature (!) 97.1 F (36.2 C), temperature source Temporal, height 5\' 4"  (1.626 m), weight 252 lb (114.3 kg), SpO2 98 %.    Affect appropriate Bronchitic female  HEENT: normal Neck supple with no adenopathy JVP normal no bruits no thyromegaly Lungs COPD no active  wheezing and good diaphragmatic motion Heart:  S1/S2 1/6 SEM  murmur, no rub, gallop or  click PMI normal Abdomen: benighn, BS positve, no tenderness, no AAA no bruit.  No HSM or HJR Distal pulses intact with no bruits No edema Neuro non-focal Skin warm and dry No muscular weakness   Labs:   Lab Results  Component Value Date   WBC 8.7 12/23/2017   HGB 14.5 12/23/2017   HCT 44.0 12/23/2017   MCV 90 12/23/2017   PLT 317 12/23/2017   No results for input(s): NA, K, CL, CO2, BUN, CREATININE, CALCIUM, PROT, BILITOT, ALKPHOS, ALT, AST, GLUCOSE in the last 168 hours.  Invalid input(s): LABALBU Lab Results  Component Value Date   TROPONINI <0.30 10/26/2013    Lab Results  Component Value Date   CHOL 171 01/06/2013   Lab Results  Component Value Date   HDL 33 (L) 01/06/2013   Lab Results  Component Value Date   LDLCALC 79 01/06/2013   Lab Results  Component Value Date   TRIG 993 (H) 05/09/2015   TRIG 295 (H) 01/06/2013   Lab Results  Component Value Date   CHOLHDL 5.2 (H) 01/06/2013   No results found for: LDLDIRECT    Radiology: No results found.  EKG: 2018 ST rate 128 nonspecific ST changes    ASSESSMENT AND PLAN:   1. Chest pain/CAD Risk:  lexiscan myovue ordered  2. Cholesterol needs updated labs on crestor 10 mg f/u [facility] May need addition of Vascepa for triglycerides  3. COPD:  Uses proventil and symbicort f/u with primary Too young for lung cancer screening CT update CXR 4. Thyroid:  On replacement labs with primary TSH 3.6 on  8/819  5. Murmur:  F/u  echo also to assess RV function and estimate PA pressure  Signed: Jenkins Rouge 05/22/2019, 9:45 AM

## 2019-05-22 ENCOUNTER — Ambulatory Visit: Payer: 59 | Admitting: Cardiovascular Disease

## 2019-05-22 ENCOUNTER — Encounter: Payer: Self-pay | Admitting: Cardiovascular Disease

## 2019-05-22 ENCOUNTER — Other Ambulatory Visit: Payer: Self-pay

## 2019-05-22 VITALS — BP 131/78 | HR 86 | Temp 97.1°F | Ht 64.0 in | Wt 252.0 lb

## 2019-05-22 DIAGNOSIS — I25709 Atherosclerosis of coronary artery bypass graft(s), unspecified, with unspecified angina pectoris: Secondary | ICD-10-CM | POA: Diagnosis not present

## 2019-05-22 DIAGNOSIS — R079 Chest pain, unspecified: Secondary | ICD-10-CM | POA: Diagnosis not present

## 2019-05-22 NOTE — Patient Instructions (Signed)
Medication Instructions:  Your physician recommends that you continue on your current medications as directed. Please refer to the Current Medication list given to you today.   Labwork: none  Testing/Procedures: Your physician has requested that you have a lexiscan myoview. For further information please visit [email or web address]. Please follow instruction sheet, as given.  Your physician has requested that you have an echocardiogram. Echocardiography is a painless test that uses sound waves to create images of your heart. It provides your doctor with information about the size and shape of your heart and how well your heart's chambers and valves are working. This procedure takes approximately one hour. There are no restrictions for this procedure.    Follow-Up: Your physician recommends that you schedule a follow-up appointment in: as needed    Any Other Special Instructions Will Be Listed Below (If Applicable).     If you need a refill on your cardiac medications before your next appointment, please call your pharmacy.

## 2019-05-25 MED FILL — PANTOPRAZOLE SOD DR 40 MG T: 40 | 90 days supply | Qty: 90 | Fill #0

## 2019-05-25 MED FILL — ALPRAZolam 1 MG TABS: 1 | 30 days supply | Qty: 60 | Fill #0

## 2019-05-30 ENCOUNTER — Encounter (HOSPITAL_BASED_OUTPATIENT_CLINIC_OR_DEPARTMENT_OTHER)
Admission: RE | Admit: 2019-05-30 | Discharge: 2019-05-30 | Disposition: A | Discharge status: Home / Self Care | Payer: 59 | Source: Ambulatory Visit | Attending: Cardiovascular Disease | Admitting: Cardiovascular Disease

## 2019-05-30 ENCOUNTER — Other Ambulatory Visit: Payer: Self-pay

## 2019-05-30 ENCOUNTER — Encounter (HOSPITAL_COMMUNITY): Payer: Self-pay

## 2019-05-30 ENCOUNTER — Encounter (HOSPITAL_COMMUNITY)
Admission: RE | Admit: 2019-05-30 | Discharge: 2019-05-30 | Disposition: A | Discharge status: Home / Self Care | Payer: 59 | Source: Ambulatory Visit | Attending: Cardiovascular Disease | Admitting: Cardiovascular Disease

## 2019-05-30 ENCOUNTER — Ambulatory Visit (HOSPITAL_COMMUNITY)
Admission: RE | Admit: 2019-05-30 | Discharge: 2019-05-30 | Disposition: A | Discharge status: Home / Self Care | Payer: 59 | Source: Ambulatory Visit | Attending: Cardiovascular Disease | Admitting: Cardiovascular Disease

## 2019-05-30 DIAGNOSIS — I25709 Atherosclerosis of coronary artery bypass graft(s), unspecified, with unspecified angina pectoris: Secondary | ICD-10-CM | POA: Insufficient documentation

## 2019-05-30 LAB — NM MYOCAR MULTI W/SPECT W/WALL MOTION / EF
LV dias vol: 79 mL (ref 46–106)
LV sys vol: 26 mL
Peak HR: 95 {beats}/min
RATE: 0.63
Rest HR: 71 {beats}/min
SDS: 1
SRS: 3
SSS: 4
TID: 1.29

## 2019-05-30 MED ORDER — TECHNETIUM TC 99M TETROFOSMIN IV KIT
10.0000 | PACK | Freq: Once | INTRAVENOUS | Status: AC | PRN
  Administered 2019-05-30: 08:00:00 10.1 via INTRAVENOUS

## 2019-05-30 MED ORDER — SODIUM CHLORIDE FLUSH 0.9 % IV SOLN
INTRAVENOUS | Status: AC
  Administered 2019-05-30: 09:00:00 10 mL via INTRAVENOUS
  Filled 2019-05-30: qty 10

## 2019-05-30 MED ORDER — REGADENOSON 0.4 MG/5ML IV SOLN
INTRAVENOUS | Status: AC
  Administered 2019-05-30: 09:00:00 0.4 mg via INTRAVENOUS
  Filled 2019-05-30: qty 5

## 2019-05-30 MED ORDER — TECHNETIUM TC 99M TETROFOSMIN IV KIT
30.0000 | PACK | Freq: Once | INTRAVENOUS | Status: AC | PRN
  Administered 2019-05-30: 09:00:00 33 via INTRAVENOUS

## 2019-05-30 NOTE — Progress Notes (Signed)
*  PRELIMINARY RESULTS* Echocardiogram 2D Echocardiogram has been performed.  Denise Ray 05/30/2019, 11:15 AM

## 2019-06-06 MED FILL — metFORMIN HCL ER 500 MG TB2: 500 | 90 days supply | Qty: 180 | Fill #0

## 2019-06-06 MED FILL — LEVOTHYROXINE 50 MCG TABLET: 50 | 30 days supply | Qty: 30 | Fill #6

## 2019-06-14 MED FILL — PREGABALIN 75 MG CAPS: 75 | 30 days supply | Qty: 30 | Fill #1

## 2019-06-17 DIAGNOSIS — G893 Neoplasm related pain (acute) (chronic): Secondary | ICD-10-CM | POA: Diagnosis not present

## 2019-06-17 DIAGNOSIS — Z79899 Other long term (current) drug therapy: Secondary | ICD-10-CM | POA: Diagnosis not present

## 2019-06-17 DIAGNOSIS — Z79891 Long term (current) use of opiate analgesic: Secondary | ICD-10-CM | POA: Diagnosis not present

## 2019-06-21 MED FILL — HYDROCODON-APAP 10-325: 10-325 | 30 days supply | Qty: 120 | Fill #0

## 2019-06-21 MED FILL — PREGABALIN 50 MG CAPS: 50 | 30 days supply | Qty: 30 | Fill #0

## 2019-07-07 MED FILL — LEVOTHYROXINE 50 MCG TABLET: 50 | 30 days supply | Qty: 30 | Fill #7

## 2019-07-16 DIAGNOSIS — E782 Mixed hyperlipidemia: Secondary | ICD-10-CM | POA: Diagnosis not present

## 2019-07-16 DIAGNOSIS — Z79891 Long term (current) use of opiate analgesic: Secondary | ICD-10-CM | POA: Diagnosis not present

## 2019-07-16 DIAGNOSIS — R5383 Other fatigue: Secondary | ICD-10-CM | POA: Diagnosis not present

## 2019-07-16 DIAGNOSIS — E1165 Type 2 diabetes mellitus with hyperglycemia: Secondary | ICD-10-CM | POA: Diagnosis not present

## 2019-07-16 DIAGNOSIS — Z79899 Other long term (current) drug therapy: Secondary | ICD-10-CM | POA: Diagnosis not present

## 2019-07-16 DIAGNOSIS — G893 Neoplasm related pain (acute) (chronic): Secondary | ICD-10-CM | POA: Diagnosis not present

## 2019-07-18 MED FILL — ALPRAZolam 1 MG TABS: 1 | 30 days supply | Qty: 60 | Fill #1

## 2019-07-19 MED FILL — HYDROCODON-APAP 5-325: 5-325 | 30 days supply | Qty: 120 | Fill #0

## 2019-08-07 MED FILL — ROSUVASTATIN CALCIUM 10 MG: 10 | 90 days supply | Qty: 90 | Fill #0

## 2019-08-07 MED FILL — VENLAFAXINE HCL ER 75 MG CA: 75 | 90 days supply | Qty: 270 | Fill #3

## 2019-08-13 DIAGNOSIS — Z79891 Long term (current) use of opiate analgesic: Secondary | ICD-10-CM | POA: Diagnosis not present

## 2019-08-13 DIAGNOSIS — Z79899 Other long term (current) drug therapy: Secondary | ICD-10-CM | POA: Diagnosis not present

## 2019-08-13 DIAGNOSIS — G893 Neoplasm related pain (acute) (chronic): Secondary | ICD-10-CM | POA: Diagnosis not present

## 2019-08-13 DIAGNOSIS — E782 Mixed hyperlipidemia: Secondary | ICD-10-CM | POA: Diagnosis not present

## 2019-08-14 MED FILL — TRULICITY 1.5 MG/0.5 ML PEN: 1.5 | 28 days supply | Qty: 2 | Fill #0

## 2019-08-15 MED FILL — HYDROCODON-APAP 10-325: 10-325 | 30 days supply | Qty: 120 | Fill #0

## 2019-08-16 MED FILL — ALPRAZolam 1 MG TABS: 1 | 30 days supply | Qty: 60 | Fill #2

## 2019-08-20 MED FILL — PANTOPRAZOLE SOD DR 40 MG T: 40 | 90 days supply | Qty: 90 | Fill #1

## 2019-08-20 MED FILL — LEVOTHYROXINE 50 MCG TABLET: 50 | 30 days supply | Qty: 30 | Fill #8

## 2019-08-21 MED FILL — metFORMIN HCL ER 500 MG TB2: 500 | 90 days supply | Qty: 180 | Fill #1

## 2019-09-13 DIAGNOSIS — G893 Neoplasm related pain (acute) (chronic): Secondary | ICD-10-CM | POA: Diagnosis not present

## 2019-09-13 DIAGNOSIS — E1142 Type 2 diabetes mellitus with diabetic polyneuropathy: Secondary | ICD-10-CM | POA: Diagnosis not present

## 2019-09-13 DIAGNOSIS — Z79899 Other long term (current) drug therapy: Secondary | ICD-10-CM | POA: Diagnosis not present

## 2019-09-13 DIAGNOSIS — Z79891 Long term (current) use of opiate analgesic: Secondary | ICD-10-CM | POA: Diagnosis not present

## 2019-09-13 DIAGNOSIS — R5383 Other fatigue: Secondary | ICD-10-CM | POA: Diagnosis not present

## 2019-09-13 MED FILL — GABAPENTIN 300 MG CAPSULE: 300 | 30 days supply | Qty: 30 | Fill #0

## 2019-09-13 MED FILL — HYDROCODON-APAP 10-325: 10-325 | 30 days supply | Qty: 120 | Fill #0

## 2019-10-12 DIAGNOSIS — G893 Neoplasm related pain (acute) (chronic): Secondary | ICD-10-CM | POA: Diagnosis not present

## 2019-10-12 DIAGNOSIS — R5383 Other fatigue: Secondary | ICD-10-CM | POA: Diagnosis not present

## 2019-10-12 DIAGNOSIS — E039 Hypothyroidism, unspecified: Secondary | ICD-10-CM | POA: Diagnosis not present

## 2019-10-12 DIAGNOSIS — Z79891 Long term (current) use of opiate analgesic: Secondary | ICD-10-CM | POA: Diagnosis not present

## 2019-10-12 DIAGNOSIS — Z79899 Other long term (current) drug therapy: Secondary | ICD-10-CM | POA: Diagnosis not present

## 2019-10-12 MED FILL — HYDROCODON-APAP 10-325: 10-325 | 60 days supply | Qty: 240 | Fill #0

## 2019-10-12 MED FILL — GABAPENTIN 300 MG CAPSULE: 300 | 90 days supply | Qty: 90 | Fill #0

## 2019-10-12 MED FILL — ALPRAZolam 1 MG TABS: 1 | 60 days supply | Qty: 120 | Fill #0

## 2019-10-17 MED FILL — LEVOTHYROXINE 50 MCG TABLET: 50 | 90 days supply | Qty: 90 | Fill #0

## 2019-10-30 ENCOUNTER — Ambulatory Visit: Payer: Self-pay | Admitting: General Surgery

## 2019-10-30 DIAGNOSIS — D013 Carcinoma in situ of anus and anal canal: Secondary | ICD-10-CM | POA: Diagnosis not present

## 2019-10-30 NOTE — H&P (Signed)
The patient is a 49 year old female who presents with anal lesions.Patient underwent hemorrhoidectomy and removal of anal condyloma in 2016. Her pathology showed AIN 2 and 3 within the biopsy specimens. We followed this area due to positive margin. She also has a h/o abnormal pap smears but the last few have been normal per pt. In 2017 she noticed that the right anterior lesion has become more bothersome and larger on exam. She was taken back to the OR for excision of this lesion in early Aug 2017. Final pathology revealed negative margins, but once again pathology showed AIN 3. She used Aldara after that. She is here today for a recheck. Last seen in 2019. She has no complaints currently except for some occasional constipation and soreness in her anal canal.    Problem List/Past Medical Leighton Ruff, MD; 6/46/8032 4:32 PM) ANAL CONDYLOMA (A63.0) INTERNAL HEMORRHOID (K64.8) ANAL INTRAEPITHELIAL NEOPLASIA III (D01.3) STATUS POST INCISION AND DRAINAGE (Z22.482)  Past Surgical History Leighton Ruff, MD; 5/00/3704 4:32 PM) SURGICAL ANORECTAL EXAMINATION UNDER ANESTHESIA (88891) with BX Cesarean Section - 1 Lung Surgery Right. Hysterectomy (not due to cancer) - Partial Hysterectomy (due to cancer) - Partial  Diagnostic Studies History Leighton Ruff, MD; 6/94/5038 4:32 PM) Pap Smear 1-5 years ago Colonoscopy never  Allergies Marguarite Arbour, RMA; 10/30/2019 4:05 PM) Morphine Derivatives Estrogens Conjugated *Estrogens Wellbutrin SR *ANTIDEPRESSANTS* Ibuprofen *ANALGESICS - ANTI-INFLAMMATORY* Morphine Sulfate *ANALGESICS - OPIOID* Tamiflu *ANTIVIRALS* Allergies Reconciled  Medication History [employer], RMA; 10/30/2019 4:08 PM) Aspirin (81MG  Tablet DR, Oral) Active. Hydrocodone-Acetaminophen (10-325MG  Tablet, Oral) Active. Levothyroxine Sodium (50MCG Capsule, Oral) Active. Multi Vitamin (Oral) Active. Fish Oil (1000MG  Capsule, Oral)  Active. PredniSONE (10MG  Tablet, Oral) Active. Rosuvastatin Calcium (10MG  Tablet, Oral) Active. Famotidine (20MG  Tablet, Oral) Active. Loratadine (10MG  Tablet, Oral) Active. MiraLax (17GM/SCOOP Powder, Oral) Active. Albuterol Sulfate (108 (90 Base)MCG/ACT Aero Pow Br Act, Inhalation) Active. Pantoprazole Sodium (40MG  Tablet DR, Oral) Active. Gabapentin (300MG  Capsule, Oral) Active. ALPRAZolam (1MG  Tablet, Oral) Active. Medications Reconciled  Social History Leighton Ruff, MD; 8/82/8003 4:32 PM) Tobacco use Current some day smoker. Caffeine use Carbonated beverages. Alcohol use Remotely quit alcohol use.  Family History Leighton Ruff, MD; 4/91/7915 4:32 PM) Breast Cancer Brother, Family Members In General. Arthritis Mother. Alcohol Abuse Family Members In General, Father. Depression Mother. Colon Polyps Father. Colon Cancer Family Members In General. Heart disease in female family member before age 60 Heart Disease Family Members In General, Mother. Diabetes Mellitus Family Members In General, Father. Respiratory Condition Family Members In General, Mother. Migraine Headache Brother. Hypertension Family Members In General, Father, Mother.  Pregnancy / Birth History Leighton Ruff, MD; 0/56/9794 4:32 PM) Age at menarche 79 years. Irregular periods Gravida 1 Contraceptive History Intrauterine device, Oral contraceptives. Para 1 Maternal age 15-30  Other Problems Leighton Ruff, MD; 12/17/6551 4:32 PM) Other disease, cancer, significant illness Migraine Headache Hypercholesterolemia Thyroid Disease Sleep Apnea Cerebrovascular Accident Asthma Anxiety Disorder Gastroesophageal Reflux Disease Depression Cervical Cancer     Review of Systems Leighton Ruff MD; 7/48/2707 4:32 PM) General Present- Fatigue. Not Present- Appetite Loss, Chills, Fever, Night Sweats, Weight Gain and Weight Loss. Skin Not Present- Change in Wart/Mole,  Dryness, Hives, Jaundice, New Lesions, Non-Healing Wounds, Rash and Ulcer. HEENT Not Present- Earache, Hearing Loss, Hoarseness, Nose Bleed, Oral Ulcers, Ringing in the Ears, Seasonal Allergies, Sinus Pain, Sore Throat, Visual Disturbances, Wears glasses/contact lenses and Yellow Eyes. Respiratory Not Present- Bloody sputum, Chronic Cough, Difficulty Breathing, Snoring and Wheezing. Cardiovascular Present- Shortness of Breath. Not Present-  Chest Pain, Difficulty Breathing Lying Down, Leg Cramps, Palpitations, Rapid Heart Rate and Swelling of Extremities. Gastrointestinal Present- Bloody Stool. Not Present- Abdominal Pain, Bloating, Change in Bowel Habits, Chronic diarrhea, Constipation, Difficulty Swallowing, Excessive gas, Gets full quickly at meals, Hemorrhoids, Indigestion, Nausea, Rectal Pain and Vomiting. Musculoskeletal Present- Back Pain. Not Present- Joint Pain, Joint Stiffness, Muscle Pain, Muscle Weakness and Swelling of Extremities. Neurological Present- Headaches. Not Present- Decreased Memory, Fainting, Numbness, Seizures, Tingling, Tremor, Trouble walking and Weakness. Endocrine Not Present- Cold Intolerance, Excessive Hunger, Hair Changes, Heat Intolerance, Hot flashes and New Diabetes.  Vitals Geni Bers Haggett RMA; 10/30/2019 4:09 PM) 10/30/2019 4:08 PM Weight: 255.4 lb Height: 64in Body Surface Area: 2.17 m Body Mass Index: 43.84 kg/m  Temp.: 98.63F(Temporal)  Pulse: 106 (Regular)  P.OX: 95% (Room air) BP: 142/86(Sitting, Left Arm, Standard)        Physical Exam Leighton Ruff MD; 6/65/9935 4:30 PM)  General Mental Status-Alert. General Appearance-Cooperative.  Rectal Anorectal Exam External - Note: Irregular appearing mole at posterior midline perianal area approximately 5 mm in size. Internal - localized tenderness.    Other: Procedure: Anoscopy....Marland KitchenMarland KitchenSurgeon: Marcello Moores....Marland KitchenMarland KitchenAfter the risks and benefits were explained, verbal consent was  obtained for above procedure. A medical assistant chaperone was present thoroughout the entire procedure. ....Marland KitchenMarland KitchenAnesthesia: none....Marland KitchenMarland KitchenDiagnosis: h/o AIN....Marland KitchenMarland KitchenFindings: Right anterior anal canal appears to be without dysplasia. There is a pedunculated mass at posterior midline with irregularity concerning for possible dysplasia. There is also a posterior midline perianal mole with irregular borders.  Performed: 10/30/2019 4:33 PM    Assessment & Plan Leighton Ruff MD; 11/16/7791 4:32 PM)  ANAL INTRAEPITHELIAL NEOPLASIA III (D01.3) Impression: 78 show female with a history of AIN grade 3, presents to the office for follow-up. On exam today she has a pedunculated mass at posterior midline that is concerning for dysplasia. I have recommended excision. Patient does have a history of postoperative urinary retention after anesthesia. We will place a Foley catheter during surgery to be removed by the patient, once anesthesia has worn off completely. We will also plan on removing her perianal mole during surgery as it appears to have irregular borders.

## 2019-11-09 MED FILL — ROSUVASTATIN CALCIUM 10 MG: 10 | 90 days supply | Qty: 90 | Fill #1

## 2019-11-13 MED FILL — DEXAMETHASONE 4 MG TABLET: 4 | 1 days supply | Qty: 1 | Fill #0

## 2019-11-27 MED FILL — PANTOPRAZOLE SOD DR 40 MG T: 40 | 90 days supply | Qty: 90 | Fill #2

## 2019-11-27 MED FILL — metFORMIN HCL ER 500 MG TB2: 500 | 90 days supply | Qty: 180 | Fill #2

## 2019-11-29 ENCOUNTER — Other Ambulatory Visit: Payer: Self-pay

## 2019-11-29 ENCOUNTER — Encounter (HOSPITAL_BASED_OUTPATIENT_CLINIC_OR_DEPARTMENT_OTHER): Payer: Self-pay | Admitting: General Surgery

## 2019-11-29 NOTE — Progress Notes (Signed)
Spoke w/ via phone for pre-op interview--- PT Lab needs dos----  Istat and EKG             Lab results------ no COVID test ------ fully vaccinated  Pt aware to bring vaccine card dos for verification. Arrive at ------- 1030 NPO after MN NO Solid Food.  Clear liquids from MN until--- 0930 Medications to take morning of surgery ----- Xanax, Synthroid, Protonix, Norco w/ sips of water Diabetic medication ----- do not take metformin morning of surgery Patient Special Instructions ----- asked to bring rescue inhaler and cpap/ mask/ tubing dos Pre-Op special Istructions ----- n/a Patient verbalized understanding of instructions that were given at this phone interview. Patient denies shortness of breath, chest pain, fever, cough a this phone interview.   Anesthesia Review:  COPD w/ asthma, DM2, OSA w/ cpap (stated uses every night);  Has post op urinary retention.  Per pt she has not any issues with anesthesia in the past (previous anesthesia notes in epic) however , her father 04/ 2021 had cardiac arrest / AtrialFib intraoperatively (having circumcision) was on vented for two days   PCP:  Emelia Loron NP Cardiologist :  Had evaluation visit 05-22-2019 w/ dr Johnsie Cancel in epic Chest x-ray : 03-24-2017 epic EKG : 03-23-2017 epic Echo : 05-30-2019 Stress test:  Nuclear 05-30-2019 epic Cardiac Cath :   no Activity level:  Pt stated gets DOE going flight of stairs but normal daily activities no sob Sleep Study/ CPAP : YES/ YES Fasting Blood Sugar :      / Checks Blood Sugar -- times a day:   Per pt does not check blood sugar Blood Thinner/ Instructions Maryjane Hurter Dose: NO ASA / Instructions/ Last Dose :  ASA 81mg  /  Per pt dr Marcello Moores was not having her to stop prior to surgery

## 2019-11-29 NOTE — Anesthesia Preprocedure Evaluation (Addendum)
Anesthesia Evaluation  Patient identified by MRN, date of birth, ID band Patient awake    Reviewed: Allergy & Precautions, NPO status , Patient's Chart, lab work & pertinent test results  Airway Mallampati: II  TM Distance: >3 FB Neck ROM: Full   Comment: SMALL MOUTH  Dental no notable dental hx. (+) Teeth Intact, Dental Advisory Given   Pulmonary asthma , sleep apnea and Continuous Positive Airway Pressure Ventilation , Current Smoker,    Pulmonary exam normal breath sounds clear to auscultation       Cardiovascular negative cardio ROS Normal cardiovascular exam Rhythm:Regular Rate:Normal  CV: Echo 05/30/19:  1. Left ventricular ejection fraction, by visual estimation, is 60 to 65%. The left ventricle has normal function. There is no left ventricular hypertrophy.  2. The left ventricle has no regional wall motion abnormalities.  3. Global right ventricle has normal systolic function.The right ventricular size is normal. No increase in right ventricular wall thickness.   Nuclear stress test 05/30/19:   There was no ST segment deviation noted during stress.  The study is normal. There are no perfusion defects consistent with prior infarct or current ischemia.  This is a low risk study.  The left ventricular ejection fraction is hyperdynamic (>65%).   Neuro/Psych  Headaches, PSYCHIATRIC DISORDERS Anxiety Depression    GI/Hepatic GERD  Medicated,  Endo/Other  diabetes, Type 2Hypothyroidism   Renal/GU   negative genitourinary   Musculoskeletal negative musculoskeletal ROS (+)   Abdominal   Peds negative pediatric ROS (+)  Hematology negative hematology ROS (+)   Anesthesia Other Findings   Reproductive/Obstetrics negative OB ROS                           Anesthesia Physical Anesthesia Plan  ASA: III  Anesthesia Plan: MAC   Post-op Pain Management:    Induction: Intravenous  PONV  Risk Score and Plan: 2 and Ondansetron  Airway Management Planned: Mask and Natural Airway  Additional Equipment: None  Intra-op Plan:   Post-operative Plan:   Informed Consent: I have reviewed the patients History and Physical, chart, labs and discussed the procedure including the risks, benefits and alternatives for the proposed anesthesia with the patient or authorized representative who has indicated his/her understanding and acceptance.     Dental advisory given  Plan Discussed with: Anesthesiologist and CRNA  Anesthesia Plan Comments: (See APP note by Durel Salts, FNP )      Anesthesia Quick Evaluation

## 2019-11-29 NOTE — Progress Notes (Addendum)
Anesthesia Chart Review:  Pt is a same day work up   Case: [id number] Date/Time: 12/07/19 1245   Procedures:      ANAL EXAM UNDER ANESTHESIA (N/A )     EXCISION OF ANAL POLYP AND PERIANAL MOLE (N/A )   Anesthesia type: Monitor Anesthesia Care   Pre-op diagnosis: ANAL POLYP, ABNORMAL APPEARING MOLE   Location: [facility] ROOM 1 / [facility]   Surgeons: Leighton Ruff, MD      DISCUSSION: Pt is a 49 year old with hx DM, asthma, OSA, heart murmur (1/6 SEM), acute respiratory failure (ARDS- required vent 2015 and 2016 due to CAP and asthma exacerbation).    PROVIDERS: - PCP is Emelia Loron, NP - Saw cardiologist Jenkins Rouge, MD 05/22/19 for chest pain, eval CAD risk. Echo and stress test ordered, reassuring results below. PRN f/u recommended  LABS: Will be obtained day of surgery    EKG: Will be obtained day of surgery    CV: Echo 05/30/19:  1. Left ventricular ejection fraction, by visual estimation, is 60 to 65%. The left ventricle has normal function. There is no left ventricular hypertrophy.  2. The left ventricle has no regional wall motion abnormalities.  3. Global right ventricle has normal systolic function.The right ventricular size is normal. No increase in right ventricular wall thickness.  4. Left atrial size was normal.  5. Right atrial size was normal.  6. The mitral valve is normal in structure. Trivial mitral valve regurgitation. No evidence of mitral stenosis.  7. The tricuspid valve is normal in structure.  8. The aortic valve is tricuspid. Aortic valve regurgitation is not visualized. No evidence of aortic valve sclerosis or stenosis.  9. The pulmonic valve was not well visualized. Pulmonic valve regurgitation is not visualized.  10. Normal pulmonary artery systolic pressure.  11. The inferior vena cava is normal in size with greater than 50%  respiratory variability, suggesting right atrial pressure of 3 mmHg.    Nuclear stress test 05/30/19:    There was no ST segment deviation noted during stress.  The study is normal. There are no perfusion defects consistent with prior infarct or current ischemia.  This is a low risk study.  The left ventricular ejection fraction is hyperdynamic (>65%).   Past Medical History:  Diagnosis Date  . Anal polyp   . Anxiety   . Arthritis    Right hip  . Chronic pain    followed by pain clinic  . Complication of anesthesia    post op urinary retention  . COPD with asthma ([place]) followed by pcp and as needed [facility]   hx exacerbation's  --- (11-29-2019  per last exacerbation >2 years;  last used rescue inhaler a week ago)  . DOE (dyspnea on exertion)    11-29-2019  per pt going flight of stairs but normal daily activites no sob  . Family history of adverse reaction to anesthesia    mother -- ponv ;  per pt in 04/ 2021 father had cardiac arrest and Atrial fib intraoperative for circumcision (this was in [place]) put on venitator for two days then had nuclear stress test per pt showed disease but no cath done, still  has atrial fib;  she also stated prior to this he known cardiac disease with no intervention  . GERD (gastroesophageal reflux disease)   . Hemorrhoids   . History of acute respiratory failure    06/ 2015  and 12/ 2016  CAP  and Asthma exacerbation--  vented both times (ARDS)  . History of anal dysplasia    AIN 2/ 3    . History of cervical dysplasia    CIN 2 in 2000  . History of chest pain    11-29-2019 per pt had cardiology evaluation for chest pain (but pt stated has not had any chest pain or cardiac symtpoms since) referred by pcp  w/ dr Johnsie Cancel note in epic 05-22-2019;  pt had nuclear stress test the showed normal perfusion no ischemia and ef >65% ,  also had echo same day showed normal w/ ef 65%  . History of hypothyroidism   . History of pneumonia    hx Required ventilatory support  . History of TIA (transient ischemic attack)    Caused by medication  reaction with combination of wellbutrin and estrogen  . Migraines   . OSA on CPAP    11-29-2019 pt stated uses every night  . Type 2 diabetes mellitus ([place])    followed by pcp  (11-29-2019 pt stated does not check blood sugar)  . Wears glasses     Past Surgical History:  Procedure Laterality Date  . CESAREAN SECTION  2001  . CO2 LASER APPLICATION N/A 6/94/8546   Procedure: CO2 LASER APPLICATION;  Surgeon: Leighton Ruff, MD;  Location: [facility];  Service: General;  Laterality: N/A;  . CONIZATION OF CERVIX  2000  . DILATION AND CURETTAGE OF UTERUS    . EVALUATION UNDER ANESTHESIA WITH HEMORRHOIDECTOMY N/A 06/15/2014   Procedure: EXAM UNDER ANESTHESIA WITH HEMORRHOIDECTOMY;  Surgeon: Doreen Salvage, MD;  Location: [facility];  Service: General;  Laterality: N/A;  . EXCISION OF SKIN TAG N/A 06/15/2014   Procedure: EXCISION OF SKIN TAGS;  Surgeon: Doreen Salvage, MD;  Location: [facility];  Service: General;  Laterality: N/A;  . HIGH RESOLUTION ANOSCOPY N/A 10/11/2014   Procedure: HIGH RESOLUTION ANOSCOPY WITH BIOPSY;  Surgeon: Leighton Ruff, MD;  Location: [facility];  Service: General;  Laterality: N/A;  . HYSTEROSCOPY WITH D & C  09/12/2007  &  2005  . RECTAL EXAM UNDER ANESTHESIA N/A 10/11/2014   Procedure: ANAL EXAM UNDER ANESTHESIA;  Surgeon: Leighton Ruff, MD;  Location: [facility];  Service: General;  Laterality: N/A;  . RECTAL EXAM UNDER ANESTHESIA N/A 12/18/2015   Procedure: ANAL EXAM UNDER ANESTHESIA  EXCISION ANAL MARGIN LESION;  Surgeon: Leighton Ruff, MD;  Location: [facility];  Service: General;  Laterality: N/A;  . ROBOTIC ASSISTED TOTAL HYSTERECTOMY N/A 10/11/2012   Procedure: ROBOTIC ASSISTED TOTAL HYSTERECTOMY;  Surgeon: Lyman Speller, MD;  Location: [facility];  Service: Gynecology;  Laterality: N/A;  . SALPINGOOPHORECTOMY Bilateral 10/11/2012   Procedure: SALPINGO OOPHORECTOMY;  Surgeon: Lyman Speller, MD;  Location: [facility];  Service: Gynecology;  Laterality: Bilateral;  . TRANSTHORACIC ECHOCARDIOGRAM  05/07/2015   lvsf vigorous, ef 70-75%/  trivial MR and TR  . VIDEO ASSISTED THORACOSCOPY (VATS)/THOROCOTOMY Right 11/04/2001   w/ Resection duplication esophageal cyst    MEDICATIONS: No current facility-administered medications for this encounter.   [name] albuterol (PROVENTIL) (5 MG/ML) 0.5% nebulizer solution  . ALPRAZolam (XANAX) 1 MG tablet  . aspirin EC 81 MG tablet  . dextromethorphan-guaiFENesin (MUCINEX DM) 30-600 MG 12hr tablet  . famotidine (PEPCID) 20 MG tablet  . gabapentin (NEURONTIN) 300 MG capsule  . HYDROcodone-acetaminophen (NORCO) 10-325 MG tablet  . lidocaine (XYLOCAINE) 2 % jelly  . loratadine (CLARITIN) 10 MG tablet  .  metFORMIN (GLUCOPHAGE XR) 500 MG 24 hr tablet  . Multiple Vitamins-Calcium (ONE-A-DAY WOMENS FORMULA PO)  . Omega-3 Fatty Acids (OMEGA-3 FISH OIL PO)  . polyethylene glycol (MIRALAX / GLYCOLAX) packet  . rosuvastatin (CRESTOR) 10 MG tablet  . venlafaxine XR (EFFEXOR-XR) 75 MG 24 hr capsule  . albuterol (PROVENTIL HFA;VENTOLIN HFA) 108 (90 BASE) MCG/ACT inhaler  . azithromycin (ZITHROMAX) 250 MG tablet  . levothyroxine (SYNTHROID) 50 MCG tablet  . pantoprazole (PROTONIX) 40 MG tablet  . PREDNISOLONE PO    If labs and EKG acceptable day of surgery, I anticipate pt can proceed with surgery as scheduled.   Willeen Cass, PhD, FNP-BC [facility]/Anesthesiology Phone: [phone] 11/29/2019 2:15 PM

## 2019-11-29 NOTE — Progress Notes (Addendum)
NEW Covid Policy July 1859  Surgery Day:   12-07-2019  Facility:  [facility]  Type of Surgery:  EUA w/ removal anal polyp  Have you had Covid vaccine? YES  Fully Covid Vaccinated:   1) 05-26-2019                                          2) 06-16-2019                                          Where? [facility] [address]                                           Type? Pfizer  In the past 14 days:        Have you had any symptoms? NO       Have you been tested covid positive? NO       Have you been in contact with someone covid positive? NO       Have you traveled internationally? NO        Is pt Immuno-compromised? NO (pt stated has prednisone on hand if needed for asthma flare-up and has not taken prednisone in several months

## 2019-11-30 NOTE — Progress Notes (Signed)
Scheduled covid

## 2019-12-04 ENCOUNTER — Other Ambulatory Visit (HOSPITAL_COMMUNITY): Payer: 59

## 2019-12-04 ENCOUNTER — Other Ambulatory Visit (HOSPITAL_COMMUNITY)
Admission: RE | Admit: 2019-12-04 | Discharge: 2019-12-04 | Disposition: A | Discharge status: Home / Self Care | Payer: 59 | Source: Ambulatory Visit | Attending: General Surgery | Admitting: General Surgery

## 2019-12-04 DIAGNOSIS — Z20822 Contact with and (suspected) exposure to covid-19: Secondary | ICD-10-CM | POA: Diagnosis not present

## 2019-12-04 DIAGNOSIS — Z01812 Encounter for preprocedural laboratory examination: Secondary | ICD-10-CM | POA: Diagnosis not present

## 2019-12-04 LAB — SARS CORONAVIRUS 2 (TAT 6-24 HRS): SARS Coronavirus 2: NEGATIVE

## 2019-12-07 ENCOUNTER — Encounter (HOSPITAL_BASED_OUTPATIENT_CLINIC_OR_DEPARTMENT_OTHER): Payer: Self-pay | Admitting: General Surgery

## 2019-12-07 ENCOUNTER — Ambulatory Visit (HOSPITAL_BASED_OUTPATIENT_CLINIC_OR_DEPARTMENT_OTHER)
Admission: RE | Admit: 2019-12-07 | Discharge: 2019-12-07 | Disposition: A | Discharge status: Home / Self Care | Payer: 59 | Attending: General Surgery | Admitting: General Surgery

## 2019-12-07 ENCOUNTER — Other Ambulatory Visit: Payer: Self-pay

## 2019-12-07 ENCOUNTER — Ambulatory Visit (HOSPITAL_BASED_OUTPATIENT_CLINIC_OR_DEPARTMENT_OTHER): Payer: 59 | Admitting: Emergency Medicine

## 2019-12-07 ENCOUNTER — Encounter (HOSPITAL_BASED_OUTPATIENT_CLINIC_OR_DEPARTMENT_OTHER)
Admission: RE | Disposition: A | Discharge status: Home / Self Care | Payer: Self-pay | Source: Home / Self Care | Attending: General Surgery

## 2019-12-07 DIAGNOSIS — J449 Chronic obstructive pulmonary disease, unspecified: Secondary | ICD-10-CM | POA: Diagnosis not present

## 2019-12-07 DIAGNOSIS — Z7984 Long term (current) use of oral hypoglycemic drugs: Secondary | ICD-10-CM | POA: Diagnosis not present

## 2019-12-07 DIAGNOSIS — F419 Anxiety disorder, unspecified: Secondary | ICD-10-CM | POA: Insufficient documentation

## 2019-12-07 DIAGNOSIS — F329 Major depressive disorder, single episode, unspecified: Secondary | ICD-10-CM | POA: Diagnosis not present

## 2019-12-07 DIAGNOSIS — G4730 Sleep apnea, unspecified: Secondary | ICD-10-CM | POA: Diagnosis not present

## 2019-12-07 DIAGNOSIS — Z79899 Other long term (current) drug therapy: Secondary | ICD-10-CM | POA: Insufficient documentation

## 2019-12-07 DIAGNOSIS — Z7952 Long term (current) use of systemic steroids: Secondary | ICD-10-CM | POA: Diagnosis not present

## 2019-12-07 DIAGNOSIS — L081 Erythrasma: Secondary | ICD-10-CM | POA: Insufficient documentation

## 2019-12-07 DIAGNOSIS — E119 Type 2 diabetes mellitus without complications: Secondary | ICD-10-CM | POA: Insufficient documentation

## 2019-12-07 DIAGNOSIS — F17200 Nicotine dependence, unspecified, uncomplicated: Secondary | ICD-10-CM | POA: Insufficient documentation

## 2019-12-07 DIAGNOSIS — F418 Other specified anxiety disorders: Secondary | ICD-10-CM | POA: Diagnosis not present

## 2019-12-07 DIAGNOSIS — E039 Hypothyroidism, unspecified: Secondary | ICD-10-CM | POA: Diagnosis not present

## 2019-12-07 DIAGNOSIS — Z8673 Personal history of transient ischemic attack (TIA), and cerebral infarction without residual deficits: Secondary | ICD-10-CM | POA: Diagnosis not present

## 2019-12-07 DIAGNOSIS — Z8541 Personal history of malignant neoplasm of cervix uteri: Secondary | ICD-10-CM | POA: Diagnosis not present

## 2019-12-07 DIAGNOSIS — E7800 Pure hypercholesterolemia, unspecified: Secondary | ICD-10-CM | POA: Insufficient documentation

## 2019-12-07 DIAGNOSIS — D013 Carcinoma in situ of anus and anal canal: Secondary | ICD-10-CM | POA: Diagnosis not present

## 2019-12-07 DIAGNOSIS — A630 Anogenital (venereal) warts: Secondary | ICD-10-CM | POA: Insufficient documentation

## 2019-12-07 DIAGNOSIS — K219 Gastro-esophageal reflux disease without esophagitis: Secondary | ICD-10-CM | POA: Insufficient documentation

## 2019-12-07 DIAGNOSIS — Z7982 Long term (current) use of aspirin: Secondary | ICD-10-CM | POA: Diagnosis not present

## 2019-12-07 DIAGNOSIS — Z79890 Hormone replacement therapy (postmenopausal): Secondary | ICD-10-CM | POA: Diagnosis not present

## 2019-12-07 DIAGNOSIS — D045 Carcinoma in situ of skin of trunk: Secondary | ICD-10-CM | POA: Insufficient documentation

## 2019-12-07 DIAGNOSIS — Z86008 Personal history of in-situ neoplasm of other site: Secondary | ICD-10-CM | POA: Diagnosis not present

## 2019-12-07 DIAGNOSIS — G4733 Obstructive sleep apnea (adult) (pediatric): Secondary | ICD-10-CM | POA: Insufficient documentation

## 2019-12-07 DIAGNOSIS — K620 Anal polyp: Secondary | ICD-10-CM | POA: Diagnosis not present

## 2019-12-07 DIAGNOSIS — J45901 Unspecified asthma with (acute) exacerbation: Secondary | ICD-10-CM | POA: Diagnosis not present

## 2019-12-07 HISTORY — DX: Anal polyp: K62.0

## 2019-12-07 HISTORY — DX: Presence of spectacles and contact lenses: Z97.3

## 2019-12-07 HISTORY — DX: Type 2 diabetes mellitus without complications: E11.9

## 2019-12-07 HISTORY — PX: MASS EXCISION: SHX2000

## 2019-12-07 HISTORY — DX: Dyspnea, unspecified: R06.00

## 2019-12-07 HISTORY — DX: Other forms of dyspnea: R06.09

## 2019-12-07 HISTORY — DX: Personal history of other specified conditions: Z87.898

## 2019-12-07 HISTORY — PX: RECTAL EXAM UNDER ANESTHESIA: SHX6399

## 2019-12-07 LAB — POCT I-STAT, CHEM 8
BUN: 7 mg/dL (ref 6–20)
Calcium, Ion: 1.22 mmol/L (ref 1.15–1.40)
Chloride: 100 mmol/L (ref 98–111)
Creatinine, Ser: 0.5 mg/dL (ref 0.44–1.00)
Glucose, Bld: 115 mg/dL — ABNORMAL HIGH (ref 70–99)
HCT: 43 % (ref 36.0–46.0)
Hemoglobin: 14.6 g/dL (ref 12.0–15.0)
Potassium: 4.3 mmol/L (ref 3.5–5.1)
Sodium: 140 mmol/L (ref 135–145)
TCO2: 28 mmol/L (ref 22–32)

## 2019-12-07 SURGERY — EXAM UNDER ANESTHESIA, RECTUM
Anesthesia: Monitor Anesthesia Care | Site: Rectum

## 2019-12-07 MED ORDER — PROPOFOL 10 MG/ML IV BOLUS
INTRAVENOUS | Status: DC | PRN
  Administered 2019-12-07: 40 mg via INTRAVENOUS

## 2019-12-07 MED ORDER — PROPOFOL 500 MG/50ML IV EMUL
INTRAVENOUS | Status: AC
  Filled 2019-12-07: qty 50

## 2019-12-07 MED ORDER — BUPIVACAINE-EPINEPHRINE 0.5% -1:200000 IJ SOLN
INTRAMUSCULAR | Status: DC | PRN
  Administered 2019-12-07: 30 mL

## 2019-12-07 MED ORDER — PROPOFOL 500 MG/50ML IV EMUL
INTRAVENOUS | Status: DC | PRN
  Administered 2019-12-07: 200 ug/kg/min via INTRAVENOUS

## 2019-12-07 MED ORDER — SODIUM CHLORIDE 0.9% FLUSH: 3.0000 mL | Freq: Two times a day (BID) | INTRAVENOUS | Status: DC

## 2019-12-07 MED ORDER — LIDOCAINE 2% (20 MG/ML) 5 ML SYRINGE
INTRAMUSCULAR | Status: DC | PRN
  Administered 2019-12-07: 50 mg via INTRAVENOUS

## 2019-12-07 MED ORDER — ACETAMINOPHEN 500 MG PO TABS
1000.0000 mg | ORAL_TABLET | ORAL | Status: AC
  Administered 2019-12-07: 1000 mg via ORAL

## 2019-12-07 MED ORDER — LIDOCAINE 5 % EX OINT
TOPICAL_OINTMENT | CUTANEOUS | Status: DC | PRN
  Administered 2019-12-07: 1

## 2019-12-07 MED ORDER — LACTATED RINGERS IV SOLN: INTRAVENOUS | Status: DC

## 2019-12-07 MED ORDER — DEXMEDETOMIDINE HCL 200 MCG/2ML IV SOLN
INTRAVENOUS | Status: DC | PRN
  Administered 2019-12-07 (×2): 4 ug via INTRAVENOUS

## 2019-12-07 MED ORDER — LIDOCAINE 2% (20 MG/ML) 5 ML SYRINGE
INTRAMUSCULAR | Status: AC
  Filled 2019-12-07: qty 5

## 2019-12-07 MED ORDER — ACETAMINOPHEN 500 MG PO TABS
ORAL_TABLET | ORAL | Status: AC
  Filled 2019-12-07: qty 2

## 2019-12-07 SURGICAL SUPPLY — 75 items
ADH SKN CLS APL DERMABOND .7 (GAUZE/BANDAGES/DRESSINGS)
APL PRP STRL LF DISP 70% ISPRP (MISCELLANEOUS) ×1
APL SKNCLS STERI-STRIP NONHPOA (GAUZE/BANDAGES/DRESSINGS) ×2
BENZOIN TINCTURE PRP APPL 2/3 (GAUZE/BANDAGES/DRESSINGS) ×6 IMPLANT
BLADE CLIPPER SENSICLIP SURGIC (BLADE) IMPLANT
BLADE EXTENDED COATED 6.5IN (ELECTRODE) IMPLANT
BLADE HEX COATED 2.75 (ELECTRODE) ×3 IMPLANT
BLADE SURG 10 STRL SS (BLADE) ×3 IMPLANT
BRIEF STRETCH FOR OB PAD LRG (UNDERPADS AND DIAPERS) ×3 IMPLANT
CANISTER SUCT 3000ML PPV (MISCELLANEOUS) ×3 IMPLANT
CATH FOLEY 2WAY SLVR  5CC 12FR (CATHETERS)
CATH FOLEY 2WAY SLVR 5CC 12FR (CATHETERS) IMPLANT
CHLORAPREP W/TINT 26 (MISCELLANEOUS) ×3 IMPLANT
CLOSURE WOUND 1/2 X4 (GAUZE/BANDAGES/DRESSINGS)
COVER BACK TABLE 60X90IN (DRAPES) ×3 IMPLANT
COVER MAYO STAND STRL (DRAPES) ×3 IMPLANT
COVER WAND RF STERILE (DRAPES) ×3 IMPLANT
DECANTER SPIKE VIAL GLASS SM (MISCELLANEOUS) ×3 IMPLANT
DERMABOND ADVANCED (GAUZE/BANDAGES/DRESSINGS)
DERMABOND ADVANCED .7 DNX12 (GAUZE/BANDAGES/DRESSINGS) IMPLANT
DRAPE LAPAROTOMY 100X72 PEDS (DRAPES) ×3 IMPLANT
DRAPE UTILITY XL STRL (DRAPES) ×3 IMPLANT
DRSG PAD ABDOMINAL 8X10 ST (GAUZE/BANDAGES/DRESSINGS) ×3 IMPLANT
DRSG TEGADERM 4X4.75 (GAUZE/BANDAGES/DRESSINGS) IMPLANT
ELECT REM PT RETURN 9FT ADLT (ELECTROSURGICAL) ×3
ELECTRODE REM PT RTRN 9FT ADLT (ELECTROSURGICAL) ×1 IMPLANT
GAUZE SPONGE 4X4 12PLY STRL (GAUZE/BANDAGES/DRESSINGS) ×3 IMPLANT
GLOVE BIO SURGEON STRL SZ 6.5 (GLOVE) ×4 IMPLANT
GLOVE BIO SURGEONS STRL SZ 6.5 (GLOVE) ×2
GLOVE BIOGEL PI IND STRL 7.0 (GLOVE) ×1 IMPLANT
GLOVE BIOGEL PI IND STRL 7.5 (GLOVE) ×1 IMPLANT
GLOVE BIOGEL PI INDICATOR 7.0 (GLOVE) ×2
GLOVE BIOGEL PI INDICATOR 7.5 (GLOVE) ×2
GLOVE SURG SS PI 7.5 STRL IVOR (GLOVE) ×6 IMPLANT
GOWN STRL REUS W/TWL XL LVL3 (GOWN DISPOSABLE) ×3 IMPLANT
HYDROGEN PEROXIDE 16OZ (MISCELLANEOUS) ×3 IMPLANT
IV CATH 14GX2 1/4 (CATHETERS) ×3 IMPLANT
IV CATH 18G SAFETY (IV SOLUTION) ×3 IMPLANT
KIT SIGMOIDOSCOPE (SET/KITS/TRAYS/PACK) IMPLANT
KIT TURNOVER CYSTO (KITS) ×3 IMPLANT
LOOP VESSEL MAXI BLUE (MISCELLANEOUS) IMPLANT
NEEDLE HYPO 22GX1.5 SAFETY (NEEDLE) ×3 IMPLANT
NS IRRIG 500ML POUR BTL (IV SOLUTION) ×3 IMPLANT
PACK BASIN DAY SURGERY FS (CUSTOM PROCEDURE TRAY) ×3 IMPLANT
PAD ARMBOARD 7.5X6 YLW CONV (MISCELLANEOUS) IMPLANT
PENCIL SMOKE EVACUATOR (MISCELLANEOUS) ×3 IMPLANT
SPONGE HEMORRHOID 8X3CM (HEMOSTASIS) IMPLANT
SPONGE SURGIFOAM ABS GEL 12-7 (HEMOSTASIS) IMPLANT
STRIP CLOSURE SKIN 1/2X4 (GAUZE/BANDAGES/DRESSINGS) IMPLANT
SUCTION FRAZIER HANDLE 10FR (MISCELLANEOUS)
SUCTION TUBE FRAZIER 10FR DISP (MISCELLANEOUS) IMPLANT
SUT CHROMIC 2 0 SH (SUTURE) IMPLANT
SUT CHROMIC 3 0 SH 27 (SUTURE) IMPLANT
SUT ETHIBOND 0 (SUTURE) IMPLANT
SUT ETHILON 2 0 FS 18 (SUTURE) IMPLANT
SUT ETHILON 4 0 PS 2 18 (SUTURE) IMPLANT
SUT SILK 2 0 SH (SUTURE) IMPLANT
SUT VIC AB 2-0 SH 27 (SUTURE)
SUT VIC AB 2-0 SH 27XBRD (SUTURE) IMPLANT
SUT VIC AB 3-0 SH 18 (SUTURE) IMPLANT
SUT VIC AB 3-0 SH 27 (SUTURE)
SUT VIC AB 3-0 SH 27XBRD (SUTURE) IMPLANT
SUT VIC AB 4-0 SH 18 (SUTURE) IMPLANT
SUT VICRYL 4-0 PS2 18IN ABS (SUTURE) IMPLANT
SWAB CULTURE ESWAB REG 1ML (MISCELLANEOUS) IMPLANT
SYR BULB IRRIG 60ML STRL (SYRINGE) ×3 IMPLANT
SYR CONTROL 10ML LL (SYRINGE) ×3 IMPLANT
TOWEL OR 17X26 10 PK STRL BLUE (TOWEL DISPOSABLE) ×6 IMPLANT
TRAY DSU PREP LF (CUSTOM PROCEDURE TRAY) ×3 IMPLANT
TRAY FOLEY W/BAG SLVR 14FR LF (SET/KITS/TRAYS/PACK) ×3 IMPLANT
TUBE CONNECTING 12'X1/4 (SUCTIONS) ×1
TUBE CONNECTING 12X1/4 (SUCTIONS) ×2 IMPLANT
UNDERPAD 30X30 (UNDERPADS AND DIAPERS) IMPLANT
WATER STERILE IRR 500ML POUR (IV SOLUTION) ×3 IMPLANT
YANKAUER SUCT BULB TIP NO VENT (SUCTIONS) ×3 IMPLANT

## 2019-12-07 NOTE — Transfer of Care (Signed)
Immediate Anesthesia Transfer of Care Note  Patient: Denise Ray  Procedure(s) Performed: Procedure(s) (LRB): ANAL EXAM UNDER ANESTHESIA (N/A) EXCISION OF ANAL POLYP AND PERIANAL MOLE (N/A)  Patient Location: PACU  Anesthesia Type: MAC  Level of Consciousness: awake, alert , oriented and patient cooperative  Airway & Oxygen Therapy: Patient Spontanous Breathing and Patient connected to face mask oxygen  Post-op Assessment: Report given to PACU RN and Post -op Vital signs reviewed and stable  Post vital signs: Reviewed and stable  Complications: No apparent anesthesia complications Last Vitals:  Vitals Value Taken Time  BP 126/59 12/07/19 1315  Temp    Pulse 94 12/07/19 1316  Resp 40 12/07/19 1316  SpO2 97 % 12/07/19 1316  Vitals shown include unvalidated device data.  Last Pain:  Vitals:   12/07/19 1023  TempSrc: Oral  PainSc: 0-No pain      Patients Stated Pain Goal: 4 (57/50/51 8335)  Complications: No complications documented.

## 2019-12-07 NOTE — Discharge Instructions (Addendum)
Beginning the day after surgery:  You may sit in a tub of warm water 2-3 times a day to relieve discomfort.  Eat a regular diet high in fiber.  Avoid foods that give you constipation or diarrhea.  Avoid foods that are difficult to digest, such as seeds, nuts, corn or popcorn.  Do not go any longer than 2 days without a bowel movement.  You may take a dose of Milk of Magnesia if you become constipated.    Drink 6-8 glasses of water daily.  Walking is encouraged.  Avoid strenuous activity and heavy lifting for one month after surgery.    Remove your foley catheter 48 hrs after surgery.  Call the office if you have any questions or concerns.  Call immediately if you develop:   Excessive rectal bleeding (more than a cup or passing large clots)  Increased discomfort  Fever greater than 100 F  Difficulty urinating   Post Anesthesia Home Care Instructions  Activity: Get plenty of rest for the remainder of the day. A responsible adult should stay with you for 24 hours following the procedure.  For the next 24 hours, DO NOT: -Drive a car -Paediatric nurse -Drink alcoholic beverages -Take any medication unless instructed by your physician -Make any legal decisions or sign important papers.  Meals: Start with liquid foods such as gelatin or soup. Progress to regular foods as tolerated. Avoid greasy, spicy, heavy foods. If nausea and/or vomiting occur, drink only clear liquids until the nausea and/or vomiting subsides. Call your physician if vomiting continues.  Special Instructions/Symptoms: Your throat may feel dry or sore from the anesthesia or the breathing tube placed in your throat during surgery. If this causes discomfort, gargle with warm salt water. The discomfort should disappear within 24 hours.  If you had a scopolamine patch placed behind your ear for the management of post- operative nausea and/or vomiting:  1. The medication in the patch is effective for 72 hours,  after which it should be removed.  Wrap patch in a tissue and discard in the trash. Wash hands thoroughly with soap and water. 2. You may remove the patch earlier than 72 hours if you experience unpleasant side effects which may include dry mouth, dizziness or visual disturbances. 3. Avoid touching the patch. Wash your hands with soap and water after contact with the patch.

## 2019-12-07 NOTE — H&P (Signed)
The patient is a 49 year old female who presents with anal lesions.Patient underwent hemorrhoidectomy and removal of anal condyloma in 2016. Her pathology showed AIN 2 and 3 within the biopsy specimens. We followed this area due to positive margin. She also has a h/o abnormal pap smears but the last few have been normal per pt. In 2017 she noticed that the right anterior lesion has become more bothersome and larger on exam. She was taken back to the OR for excision of this lesion in early Aug 2017. Final pathology revealed negative margins, but once again pathology showed AIN 3. She used Aldara after that. She is here today for a recheck. Last seen in 2019. She has no complaints currently except for some occasional constipation and soreness in her anal canal.    Problem List/Past Medical Leighton Ruff, MD; 6/33/3545 4:32 PM) ANAL CONDYLOMA (A63.0) INTERNAL HEMORRHOID (K64.8) ANAL INTRAEPITHELIAL NEOPLASIA III (D01.3) STATUS POST INCISION AND DRAINAGE (G25.638)  Past Surgical History Leighton Ruff, MD; 9/37/3428 4:32 PM) SURGICAL ANORECTAL EXAMINATION UNDER ANESTHESIA (76811) with BX Cesarean Section - 1 Lung Surgery Right. Hysterectomy (not due to cancer) - Partial Hysterectomy (due to cancer) - Partial  Diagnostic Studies History Leighton Ruff, MD; 5/72/6203 4:32 PM) Pap Smear 1-5 years ago Colonoscopy never  Allergies Marguarite Arbour, RMA; 10/30/2019 4:05 PM) Morphine Derivatives Estrogens Conjugated *Estrogens Wellbutrin SR *ANTIDEPRESSANTS* Ibuprofen *ANALGESICS - ANTI-INFLAMMATORY* Morphine Sulfate *ANALGESICS - OPIOID* Tamiflu *ANTIVIRALS* Allergies Reconciled  Medication History [employer], RMA; 10/30/2019 4:08 PM) Aspirin (81MG  Tablet DR, Oral) Active. Hydrocodone-Acetaminophen (10-325MG  Tablet, Oral) Active. Levothyroxine Sodium (50MCG Capsule, Oral) Active. Multi Vitamin (Oral) Active. Fish Oil (1000MG  Capsule, Oral)  Active. PredniSONE (10MG  Tablet, Oral) Active. Rosuvastatin Calcium (10MG  Tablet, Oral) Active. Famotidine (20MG  Tablet, Oral) Active. Loratadine (10MG  Tablet, Oral) Active. MiraLax (17GM/SCOOP Powder, Oral) Active. Albuterol Sulfate (108 (90 Base)MCG/ACT Aero Pow Br Act, Inhalation) Active. Pantoprazole Sodium (40MG  Tablet DR, Oral) Active. Gabapentin (300MG  Capsule, Oral) Active. ALPRAZolam (1MG  Tablet, Oral) Active. Medications Reconciled  Social History Leighton Ruff, MD; 5/59/7416 4:32 PM) Tobacco use Current some day smoker. Caffeine use Carbonated beverages. Alcohol use Remotely quit alcohol use.  Family History Leighton Ruff, MD; 3/84/5364 4:32 PM) Breast Cancer Brother, Family Members In General. Arthritis Mother. Alcohol Abuse Family Members In General, Father. Depression Mother. Colon Polyps Father. Colon Cancer Family Members In General. Heart disease in female family member before age 83 Heart Disease Family Members In General, Mother. Diabetes Mellitus Family Members In General, Father. Respiratory Condition Family Members In General, Mother. Migraine Headache Brother. Hypertension Family Members In General, Father, Mother.  Pregnancy / Birth History Leighton Ruff, MD; 6/80/3212 4:32 PM) Age at menarche 36 years. Irregular periods Gravida 1 Contraceptive History Intrauterine device, Oral contraceptives. Para 1 Maternal age 5-30  Other Problems Leighton Ruff, MD; 2/48/2500 4:32 PM) Other disease, cancer, significant illness Migraine Headache Hypercholesterolemia Thyroid Disease Sleep Apnea Cerebrovascular Accident Asthma Anxiety Disorder Gastroesophageal Reflux Disease Depression Cervical Cancer     Review of Systems General Present- Fatigue. Not Present- Appetite Loss, Chills, Fever, Night Sweats, Weight Gain and Weight Loss. Skin Not Present- Change in Wart/Mole, Dryness, Hives, Jaundice, New  Lesions, Non-Healing Wounds, Rash and Ulcer. HEENT Not Present- Earache, Hearing Loss, Hoarseness, Nose Bleed, Oral Ulcers, Ringing in the Ears, Seasonal Allergies, Sinus Pain, Sore Throat, Visual Disturbances, Wears glasses/contact lenses and Yellow Eyes. Respiratory Not Present- Bloody sputum, Chronic Cough, Difficulty Breathing, Snoring and Wheezing. Cardiovascular Present- Shortness of Breath. Not Present- Chest Pain, Difficulty Breathing Lying Down, Leg  Cramps, Palpitations, Rapid Heart Rate and Swelling of Extremities. Gastrointestinal Present- Bloody Stool. Not Present- Abdominal Pain, Bloating, Change in Bowel Habits, Chronic diarrhea, Constipation, Difficulty Swallowing, Excessive gas, Gets full quickly at meals, Hemorrhoids, Indigestion, Nausea, Rectal Pain and Vomiting. Musculoskeletal Present- Back Pain. Not Present- Joint Pain, Joint Stiffness, Muscle Pain, Muscle Weakness and Swelling of Extremities. Neurological Present- Headaches. Not Present- Decreased Memory, Fainting, Numbness, Seizures, Tingling, Tremor, Trouble walking and Weakness. Endocrine Not Present- Cold Intolerance, Excessive Hunger, Hair Changes, Heat Intolerance, Hot flashes and New Diabetes.  BP (!) 152/76   Pulse 93   Temp 97.9 F (36.6 C) (Oral)   Resp 18   Ht 5\' 4"  (1.626 m)   Wt 115 kg   SpO2 97%   BMI 43.53 kg/m     Physical Exam Leighton Ruff MD; 0/53/9767 4:30 PM)  General Mental Status-Alert. General Appearance-Cooperative.  Rectal Anorectal Exam External - Note: Irregular appearing mole at posterior midline perianal area approximately 5 mm in size. Internal - localized tenderness.    Other: Procedure: Anoscopy....Marland KitchenMarland KitchenSurgeon: Marcello Moores....Marland KitchenMarland KitchenAfter the risks and benefits were explained, verbal consent was obtained for above procedure. A medical assistant chaperone was present thoroughout the entire procedure. ....Marland KitchenMarland KitchenAnesthesia: none....Marland KitchenMarland KitchenDiagnosis: h/o AIN....Marland KitchenMarland KitchenFindings: Right  anterior anal canal appears to be without dysplasia. There is a pedunculated mass at posterior midline with irregularity concerning for possible dysplasia. There is also a posterior midline perianal mole with irregular borders.  Performed: 10/30/2019 4:33 PM    Assessment & Plan Leighton Ruff MD; 3/41/9379 4:32 PM)  ANAL INTRAEPITHELIAL NEOPLASIA III (D01.3) Impression: 69 show female with a history of AIN grade 3, presents to the office for follow-up. On exam today she has a pedunculated mass at posterior midline that is concerning for dysplasia. I have recommended excision. Patient does have a history of postoperative urinary retention after anesthesia. We will place a Foley catheter during surgery to be removed by the patient, once anesthesia has worn off completely. We will also plan on removing her perianal mole during surgery as it appears to have irregular borders.

## 2019-12-07 NOTE — Op Note (Signed)
12/07/2019  1:07 PM  PATIENT:  Denise Ray  49 y.o. female  Patient Care Team: Emelia Loron, NP as PCP - General (Nurse Practitioner)  PRE-OPERATIVE DIAGNOSIS:  ANAL POLYP, ABNORMAL APPEARING MOLE  POST-OPERATIVE DIAGNOSIS:  ANAL POLYP, ABNORMAL APPEARING MOLE  PROCEDURE: ANAL EXAM UNDER ANESTHESIA EXCISION OF ANAL POLYP AND PERIANAL MOLE    Surgeon(s): Leighton Ruff, MD  ASSISTANT: none   ANESTHESIA:   local and MAC  SPECIMEN:  Source of Specimen:  R posterior anal canal polyp and perinanal mole  DISPOSITION OF SPECIMEN:  PATHOLOGY  COUNTS:  YES  PLAN OF CARE: Discharge to home after PACU  PATIENT DISPOSITION:  PACU - hemodynamically stable.  INDICATION: 49 y.o. F with history of AIN who presents to the office with a new anal canal lesion.  I recommended excision.  She also has a perianal mole with irregular borders.  We have decided to remove this as well.     OR FINDINGS: Friable anal canal, right posterior pedunculated anal canal mass.  Right posterior perianal mole  DESCRIPTION: the patient was identified in the preoperative holding area and taken to the OR where they were laid on the operating room table.  MAC anesthesia was induced without difficulty. The patient was then positioned in prone jackknife position with buttocks gently taped apart.  The patient was then prepped and draped in usual sterile fashion.  SCDs were noted to be in place prior to the initiation of anesthesia. A surgical timeout was performed indicating the correct patient, procedure, positioning and need for preoperative antibiotics.  A rectal block was performed using Marcaine with epinephrine.    I began with a digital rectal exam.  The patient had normal rectal tone.  I then placed a Hill-Ferguson anoscope into the anal canal and evaluated this completely.  The patient had friable anal canal tissue but no signs of any other discrete lesions.  There were several areas of pigmented skin within  the anal canal and perianal region.  The largest of these was in the right posterior perianal area.  It was approximately 5 mm in size with irregular borders.  There was a pedunculated lesion within the anal canal in the right posterior anal canal.  I began by excising the pedunculated mass using Metzenbaum scissors.  A 2-0 chromic suture was used to close the anoderm for hemostasis.  I then shaved the perianal mole with a 15 blade scalpel.  The edges of the skin reapproximated using interrupted 2-0 chromic suture.  The patient tolerated this well.  A Foley catheter was inserted under sterile conditions for postoperative control of urinary retention after anesthesia.

## 2019-12-08 ENCOUNTER — Encounter (HOSPITAL_BASED_OUTPATIENT_CLINIC_OR_DEPARTMENT_OTHER): Payer: Self-pay | Admitting: General Surgery

## 2019-12-08 LAB — SURGICAL PATHOLOGY

## 2019-12-08 NOTE — Anesthesia Postprocedure Evaluation (Signed)
Anesthesia Post Note  Patient: Denise Ray  Procedure(s) Performed: ANAL EXAM UNDER ANESTHESIA (N/A Rectum) EXCISION OF ANAL POLYP AND PERIANAL MOLE (N/A Rectum)     Patient location during evaluation: PACU Anesthesia Type: MAC Level of consciousness: awake and alert Pain management: pain level controlled Vital Signs Assessment: post-procedure vital signs reviewed and stable Respiratory status: spontaneous breathing, nonlabored ventilation, respiratory function stable and patient connected to nasal cannula oxygen Cardiovascular status: stable and blood pressure returned to baseline Postop Assessment: no apparent nausea or vomiting Anesthetic complications: no   No complications documented.  Last Vitals:  Vitals:   12/07/19 1345 12/07/19 1430  BP: (!) 129/51 (!) 121/59  Pulse: 86 65  Resp: 14 14  Temp:  37.1 C  SpO2: 93% 94%    Last Pain:  Vitals:   12/08/19 0951  TempSrc:   PainSc: 7                  Elfreida Heggs

## 2019-12-15 DIAGNOSIS — R5383 Other fatigue: Secondary | ICD-10-CM | POA: Diagnosis not present

## 2019-12-15 DIAGNOSIS — G893 Neoplasm related pain (acute) (chronic): Secondary | ICD-10-CM | POA: Diagnosis not present

## 2019-12-15 DIAGNOSIS — E1142 Type 2 diabetes mellitus with diabetic polyneuropathy: Secondary | ICD-10-CM | POA: Diagnosis not present

## 2019-12-15 DIAGNOSIS — Z79891 Long term (current) use of opiate analgesic: Secondary | ICD-10-CM | POA: Diagnosis not present

## 2019-12-15 MED FILL — GABAPENTIN 300 MG CAPSULE: 300 | 90 days supply | Qty: 180 | Fill #0

## 2019-12-15 MED FILL — HYDROCODON-APAP 10-325: 10-325 | 60 days supply | Qty: 240 | Fill #0

## 2019-12-15 MED FILL — ALPRAZOLAM 1 MG TABS: 1 | 60 days supply | Qty: 120 | Fill #0

## 2020-01-10 MED FILL — LEVOTHYROXINE 50 MCG TABLET: 50 | 90 days supply | Qty: 90 | Fill #1

## 2020-01-15 ENCOUNTER — Other Ambulatory Visit (HOSPITAL_COMMUNITY): Payer: 59

## 2020-01-15 DIAGNOSIS — E1142 Type 2 diabetes mellitus with diabetic polyneuropathy: Secondary | ICD-10-CM | POA: Diagnosis not present

## 2020-01-15 DIAGNOSIS — G893 Neoplasm related pain (acute) (chronic): Secondary | ICD-10-CM | POA: Diagnosis not present

## 2020-01-15 DIAGNOSIS — F17210 Nicotine dependence, cigarettes, uncomplicated: Secondary | ICD-10-CM | POA: Diagnosis not present

## 2020-01-15 DIAGNOSIS — R635 Abnormal weight gain: Secondary | ICD-10-CM | POA: Diagnosis not present

## 2020-01-15 DIAGNOSIS — Z79899 Other long term (current) drug therapy: Secondary | ICD-10-CM | POA: Diagnosis not present

## 2020-02-06 ENCOUNTER — Other Ambulatory Visit (HOSPITAL_COMMUNITY): Payer: Self-pay | Admitting: Nurse Practitioner

## 2020-02-06 DIAGNOSIS — E1142 Type 2 diabetes mellitus with diabetic polyneuropathy: Secondary | ICD-10-CM | POA: Diagnosis not present

## 2020-02-06 DIAGNOSIS — G893 Neoplasm related pain (acute) (chronic): Secondary | ICD-10-CM | POA: Diagnosis not present

## 2020-02-06 DIAGNOSIS — Z79899 Other long term (current) drug therapy: Secondary | ICD-10-CM | POA: Diagnosis not present

## 2020-02-06 DIAGNOSIS — Z79891 Long term (current) use of opiate analgesic: Secondary | ICD-10-CM | POA: Diagnosis not present

## 2020-02-06 MED FILL — HYDROCODON-APAP 10-325: 10-325 | 30 days supply | Qty: 120 | Fill #0

## 2020-02-12 MED FILL — ALPRAZOLAM 1 MG TABS: 1 | 60 days supply | Qty: 120 | Fill #1

## 2020-02-13 MED FILL — GABAPENTIN 100 MG CAPSULE: 100 | 30 days supply | Qty: 120 | Fill #0

## 2020-02-13 MED FILL — metFORMIN HCL ER 500 MG TB2: 500 | 90 days supply | Qty: 180 | Fill #3

## 2020-02-13 MED FILL — PANTOPRAZOLE SOD DR 40 MG T: 40 | 90 days supply | Qty: 90 | Fill #3

## 2020-03-16 DIAGNOSIS — E1142 Type 2 diabetes mellitus with diabetic polyneuropathy: Secondary | ICD-10-CM | POA: Diagnosis not present

## 2020-03-16 DIAGNOSIS — Z79891 Long term (current) use of opiate analgesic: Secondary | ICD-10-CM | POA: Diagnosis not present

## 2020-03-16 DIAGNOSIS — Z79899 Other long term (current) drug therapy: Secondary | ICD-10-CM | POA: Diagnosis not present

## 2020-03-16 DIAGNOSIS — G893 Neoplasm related pain (acute) (chronic): Secondary | ICD-10-CM | POA: Diagnosis not present

## 2020-03-20 ENCOUNTER — Other Ambulatory Visit (HOSPITAL_COMMUNITY): Payer: Self-pay | Admitting: Physical Medicine and Rehabilitation

## 2020-03-20 DIAGNOSIS — Z1231 Encounter for screening mammogram for malignant neoplasm of breast: Secondary | ICD-10-CM

## 2020-04-08 ENCOUNTER — Ambulatory Visit (HOSPITAL_COMMUNITY): Payer: 59

## 2020-04-10 MED FILL — GABAPENTIN 100 MG CAPSULE: 100 | 30 days supply | Qty: 120 | Fill #1

## 2020-04-10 MED FILL — ALPRAZOLAM 1 MG TABS: 1 | 60 days supply | Qty: 120 | Fill #2

## 2020-04-14 ENCOUNTER — Other Ambulatory Visit (HOSPITAL_COMMUNITY): Payer: Self-pay | Admitting: Nurse Practitioner

## 2020-04-14 DIAGNOSIS — G893 Neoplasm related pain (acute) (chronic): Secondary | ICD-10-CM | POA: Diagnosis not present

## 2020-04-14 DIAGNOSIS — E1165 Type 2 diabetes mellitus with hyperglycemia: Secondary | ICD-10-CM | POA: Diagnosis not present

## 2020-04-14 DIAGNOSIS — R5383 Other fatigue: Secondary | ICD-10-CM | POA: Diagnosis not present

## 2020-04-14 DIAGNOSIS — E7800 Pure hypercholesterolemia, unspecified: Secondary | ICD-10-CM | POA: Diagnosis not present

## 2020-04-14 DIAGNOSIS — Z79891 Long term (current) use of opiate analgesic: Secondary | ICD-10-CM | POA: Diagnosis not present

## 2020-04-14 DIAGNOSIS — E1142 Type 2 diabetes mellitus with diabetic polyneuropathy: Secondary | ICD-10-CM | POA: Diagnosis not present

## 2020-04-14 DIAGNOSIS — Z79899 Other long term (current) drug therapy: Secondary | ICD-10-CM | POA: Diagnosis not present

## 2020-04-15 MED FILL — HYDROCODON-APAP 10-325: 10-325 | 30 days supply | Qty: 120 | Fill #0

## 2020-04-15 MED FILL — PHENTERMINE HCL 15 MG CAPS: 15 | 30 days supply | Qty: 30 | Fill #0

## 2020-05-15 MED FILL — GABAPENTIN 100 MG CAPSULE: 100 | 30 days supply | Qty: 120 | Fill #2

## 2020-05-21 ENCOUNTER — Other Ambulatory Visit (HOSPITAL_COMMUNITY): Payer: Self-pay | Admitting: Nurse Practitioner

## 2020-05-21 DIAGNOSIS — G893 Neoplasm related pain (acute) (chronic): Secondary | ICD-10-CM | POA: Diagnosis not present

## 2020-05-21 DIAGNOSIS — K219 Gastro-esophageal reflux disease without esophagitis: Secondary | ICD-10-CM | POA: Diagnosis not present

## 2020-05-21 DIAGNOSIS — Z79899 Other long term (current) drug therapy: Secondary | ICD-10-CM | POA: Diagnosis not present

## 2020-05-21 DIAGNOSIS — E7800 Pure hypercholesterolemia, unspecified: Secondary | ICD-10-CM | POA: Diagnosis not present

## 2020-05-21 DIAGNOSIS — R635 Abnormal weight gain: Secondary | ICD-10-CM | POA: Diagnosis not present

## 2020-05-21 DIAGNOSIS — E1142 Type 2 diabetes mellitus with diabetic polyneuropathy: Secondary | ICD-10-CM | POA: Diagnosis not present

## 2020-05-21 MED FILL — PHENTERMINE HCL 15 MG CAPS: 15 | 30 days supply | Qty: 30 | Fill #0

## 2020-05-21 MED FILL — METFORMIN HCL 850 MG TABS: 850 | 90 days supply | Qty: 180 | Fill #0

## 2020-05-21 MED FILL — PANTOPRAZOLE SOD DR 40 MG T: 40 | 90 days supply | Qty: 90 | Fill #0

## 2020-05-21 MED FILL — HYDROCODON-APAP 10-325: 10-325 | 30 days supply | Qty: 120 | Fill #0

## 2020-05-21 MED FILL — ROSUVASTATIN CALCIUM 20 MG: 20 | 90 days supply | Qty: 90 | Fill #0

## 2020-05-21 MED FILL — VENLAFAXINE HCL ER 225 MG T: 225 | 90 days supply | Qty: 90 | Fill #0

## 2020-05-21 MED FILL — LEVOTHYROXINE 50 MCG TABLET: 50 | 90 days supply | Qty: 90 | Fill #0

## 2020-06-14 MED FILL — GABAPENTIN 100 MG CAPSULE: 100 | 30 days supply | Qty: 120 | Fill #0

## 2020-06-21 ENCOUNTER — Ambulatory Visit: Payer: 59

## 2020-06-24 DIAGNOSIS — A630 Anogenital (venereal) warts: Secondary | ICD-10-CM | POA: Diagnosis not present

## 2020-06-25 ENCOUNTER — Other Ambulatory Visit (HOSPITAL_COMMUNITY): Payer: Self-pay | Admitting: Nurse Practitioner

## 2020-06-25 DIAGNOSIS — F411 Generalized anxiety disorder: Secondary | ICD-10-CM | POA: Diagnosis not present

## 2020-06-25 DIAGNOSIS — E1142 Type 2 diabetes mellitus with diabetic polyneuropathy: Secondary | ICD-10-CM | POA: Diagnosis not present

## 2020-06-25 DIAGNOSIS — G893 Neoplasm related pain (acute) (chronic): Secondary | ICD-10-CM | POA: Diagnosis not present

## 2020-06-25 DIAGNOSIS — R635 Abnormal weight gain: Secondary | ICD-10-CM | POA: Diagnosis not present

## 2020-06-25 MED FILL — HYDROCODON-APAP 10-325: 10-325 | 30 days supply | Qty: 120 | Fill #0

## 2020-06-25 MED FILL — ALPRAZOLAM 1 MG TABS: 1 | 30 days supply | Qty: 60 | Fill #0

## 2020-06-25 MED FILL — PHENTERMINE HCL 15 MG CAPS: 15 | 30 days supply | Qty: 30 | Fill #0

## 2020-07-18 MED FILL — GABAPENTIN 100 MG CAPSULE: 100 | 30 days supply | Qty: 120 | Fill #0

## 2020-07-23 ENCOUNTER — Other Ambulatory Visit (HOSPITAL_COMMUNITY): Payer: Self-pay | Admitting: Nurse Practitioner

## 2020-07-23 DIAGNOSIS — R635 Abnormal weight gain: Secondary | ICD-10-CM | POA: Diagnosis not present

## 2020-07-23 DIAGNOSIS — G893 Neoplasm related pain (acute) (chronic): Secondary | ICD-10-CM | POA: Diagnosis not present

## 2020-07-23 DIAGNOSIS — Z79899 Other long term (current) drug therapy: Secondary | ICD-10-CM | POA: Diagnosis not present

## 2020-07-23 DIAGNOSIS — E1142 Type 2 diabetes mellitus with diabetic polyneuropathy: Secondary | ICD-10-CM | POA: Diagnosis not present

## 2020-07-23 MED FILL — PHENTERMINE HCL 30 MG CAP: 30 | 30 days supply | Qty: 30 | Fill #0

## 2020-07-23 MED FILL — TOPIRAMATE 25 MG TABLET: 25 | 30 days supply | Qty: 60 | Fill #0

## 2020-07-23 MED FILL — OZEMPIC 0.25 OR 0.5 MG/DOSE: 2 | 84 days supply | Qty: 5 | Fill #0

## 2020-07-23 MED FILL — HYDROCODON-APAP 10-325: 10-325 | 30 days supply | Qty: 120 | Fill #0

## 2020-07-26 MED FILL — ALPRAZOLAM 1 MG TABS: 1 | 30 days supply | Qty: 60 | Fill #1

## 2020-07-29 DIAGNOSIS — H524 Presbyopia: Secondary | ICD-10-CM | POA: Diagnosis not present

## 2020-07-29 DIAGNOSIS — H5203 Hypermetropia, bilateral: Secondary | ICD-10-CM | POA: Diagnosis not present

## 2020-07-29 DIAGNOSIS — H35033 Hypertensive retinopathy, bilateral: Secondary | ICD-10-CM | POA: Diagnosis not present

## 2020-07-29 DIAGNOSIS — E119 Type 2 diabetes mellitus without complications: Secondary | ICD-10-CM | POA: Diagnosis not present

## 2020-08-02 ENCOUNTER — Other Ambulatory Visit: Payer: Self-pay

## 2020-08-02 ENCOUNTER — Ambulatory Visit (HOSPITAL_COMMUNITY)
Admission: RE | Admit: 2020-08-02 | Discharge: 2020-08-02 | Disposition: A | Discharge status: Home / Self Care | Payer: 59 | Source: Ambulatory Visit | Attending: Physical Medicine and Rehabilitation | Admitting: Physical Medicine and Rehabilitation

## 2020-08-02 DIAGNOSIS — Z1231 Encounter for screening mammogram for malignant neoplasm of breast: Secondary | ICD-10-CM | POA: Diagnosis not present

## 2020-08-02 IMAGING — MG MM DIGITAL SCREENING BILAT W/ TOMO AND CAD
6 of 10 series · 6 of 30 positions shown · non-contrast
Comparison: Previous exam(s).

CLINICAL DATA: Screening.

EXAM:
DIGITAL SCREENING BILATERAL MAMMOGRAM WITH TOMOSYNTHESIS AND CAD
TECHNIQUE: Bilateral screening digital craniocaudal and mediolateral oblique
mammograms were obtained. Bilateral screening digital breast
tomosynthesis was performed. The images were evaluated with
computer-aided detection.

[R CC synth-2D]
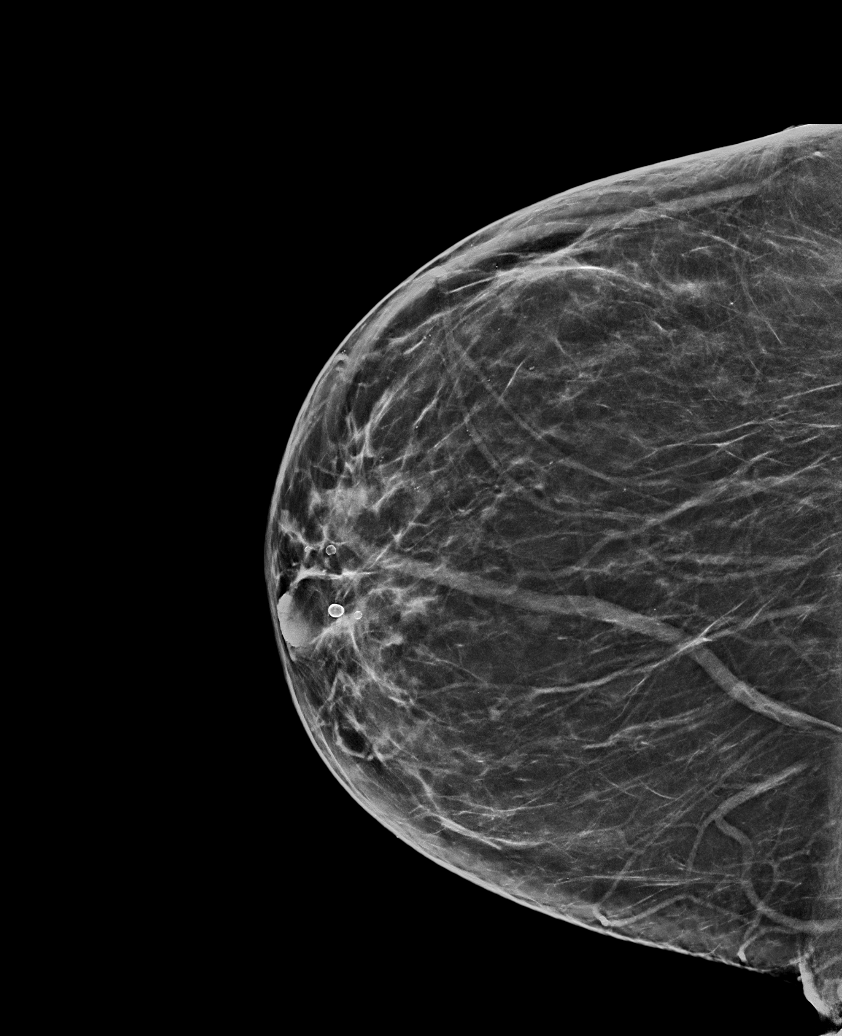

[L MLO synth-2D]
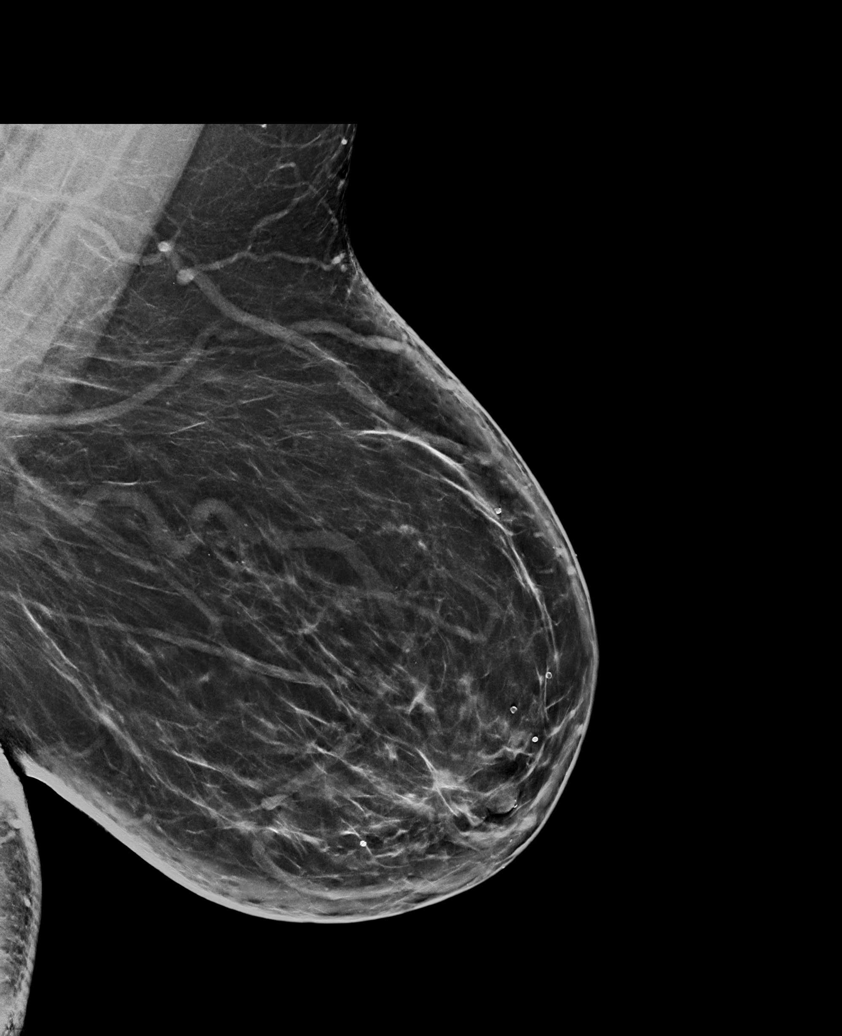

[L CC synth-2D (1 of 2)]
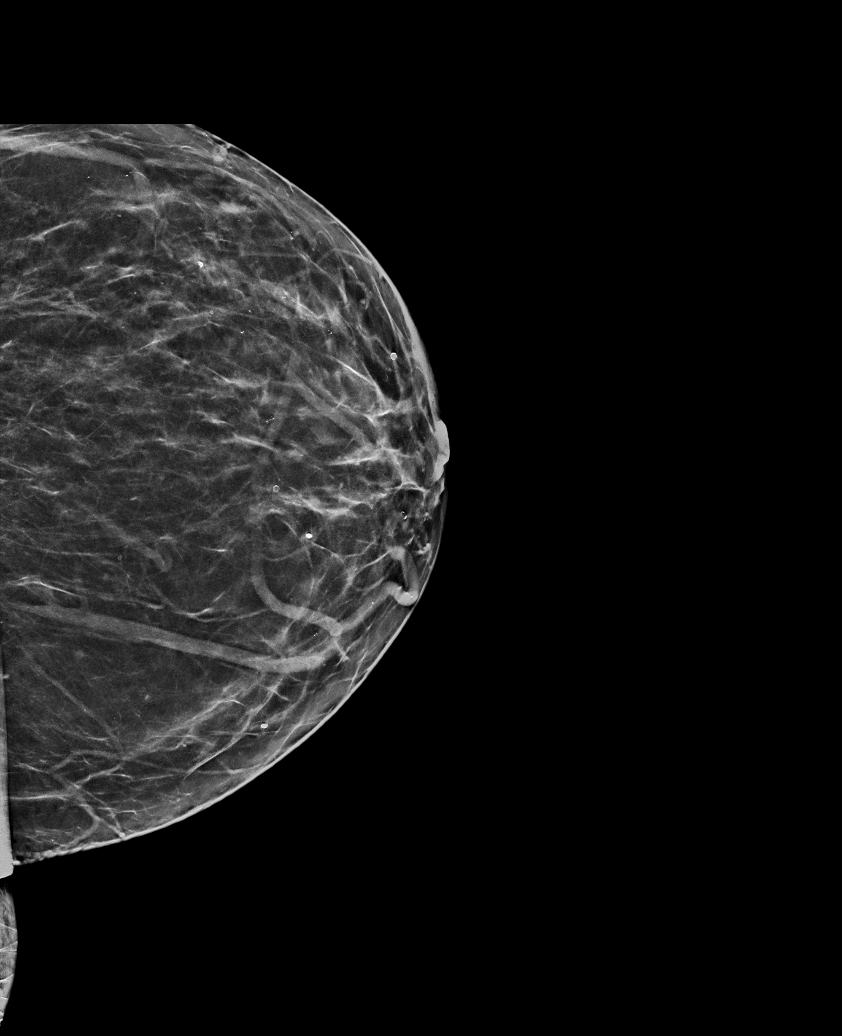

[R MLO synth-2D]
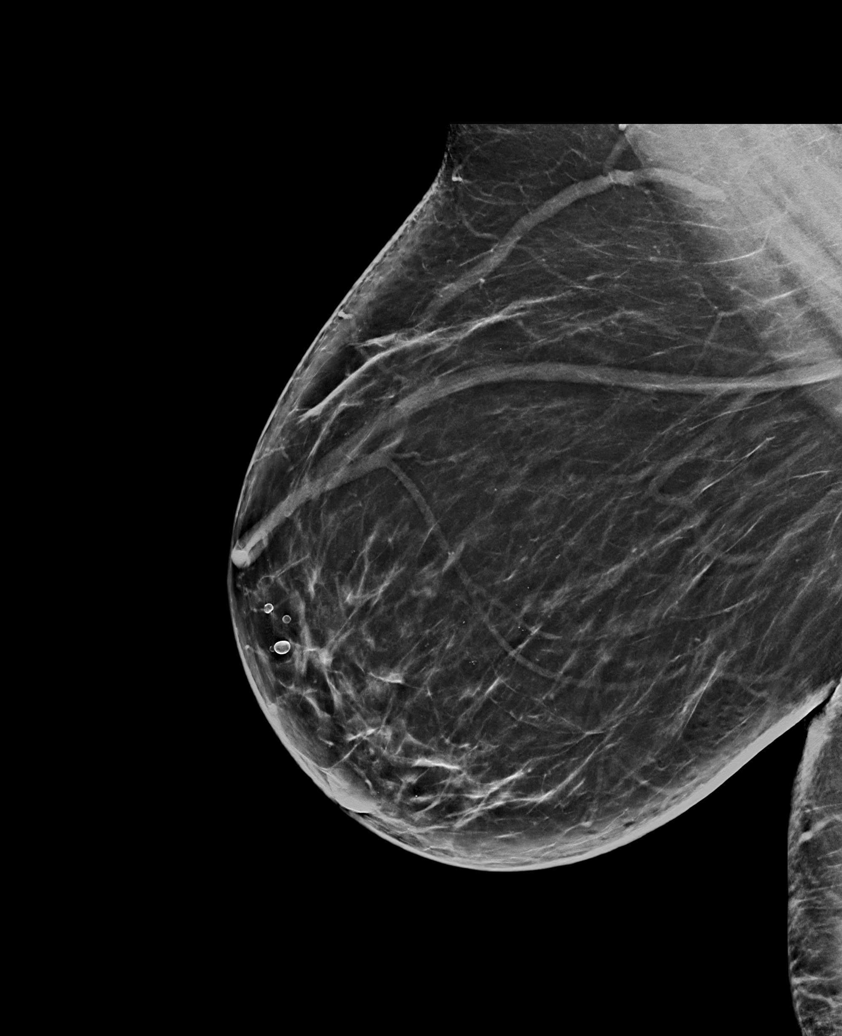

[L CC synth-2D (2 of 2)]
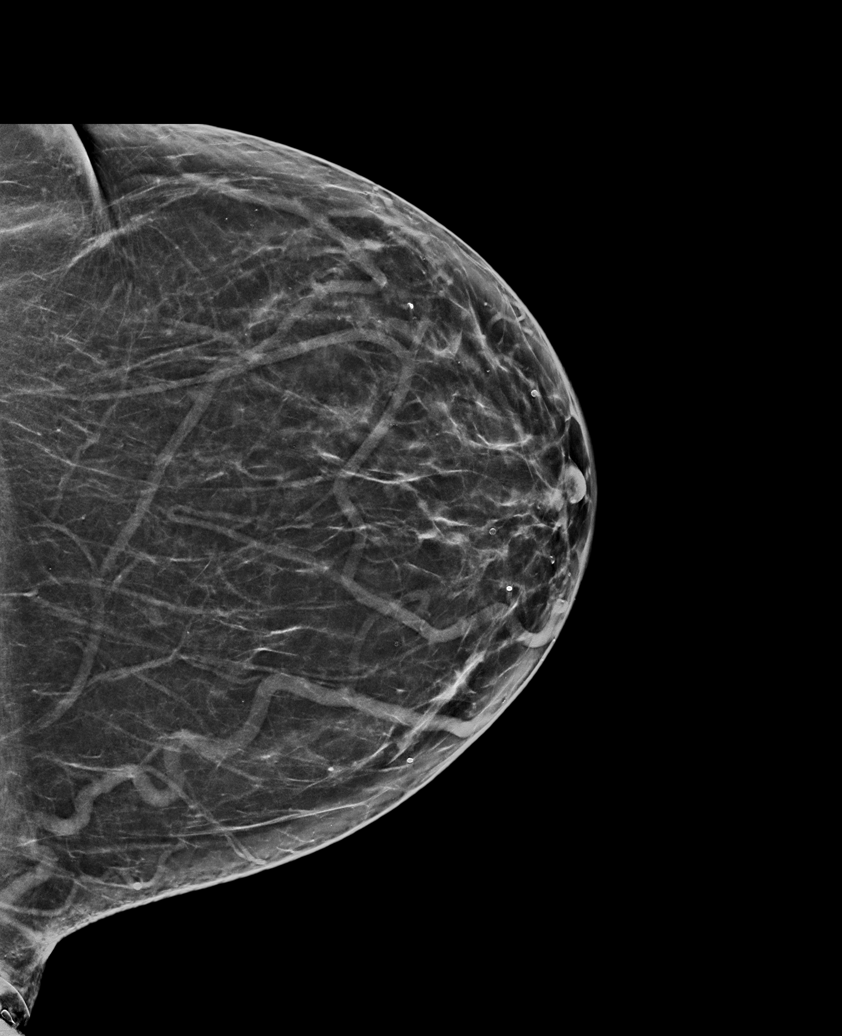

[L CC tomo · tomo slice 37/72.0]
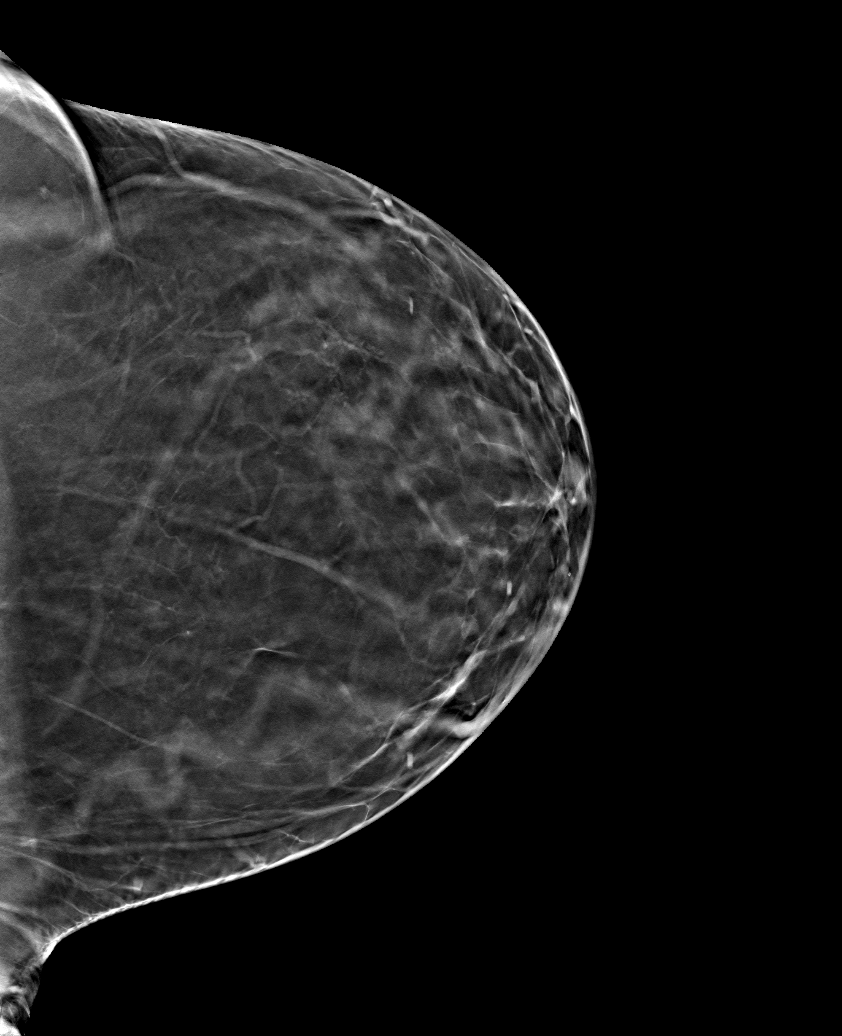

[6 of 30 positions shown; findings below may reference images not displayed]

ACR Breast Density Category b: There are scattered areas of
fibroglandular density.
FINDINGS: There are no findings suspicious for malignancy. The images were
evaluated with computer-aided detection.
IMPRESSION: No mammographic evidence of malignancy. A result letter of this
screening mammogram will be mailed directly to the patient.

RECOMMENDATION:
Screening mammogram in one year. (Code:[OD])

BI-RADS CATEGORY  1: Negative.

## 2020-08-06 ENCOUNTER — Other Ambulatory Visit (HOSPITAL_BASED_OUTPATIENT_CLINIC_OR_DEPARTMENT_OTHER): Payer: Self-pay

## 2020-08-19 ENCOUNTER — Other Ambulatory Visit (HOSPITAL_COMMUNITY): Payer: Self-pay

## 2020-08-19 MED FILL — Rosuvastatin Calcium Tab 20 MG: ORAL | 90 days supply | Qty: 90 | Fill #0 | Status: AC

## 2020-08-19 MED FILL — Pantoprazole Sodium EC Tab 40 MG (Base Equiv): ORAL | 90 days supply | Qty: 90 | Fill #0 | Status: AC

## 2020-08-19 MED FILL — Metformin HCl Tab 850 MG: ORAL | 90 days supply | Qty: 180 | Fill #0 | Status: AC

## 2020-08-19 MED FILL — Venlafaxine HCl Tab ER 24HR 225 MG (Base Equivalent): ORAL | 90 days supply | Qty: 90 | Fill #0 | Status: AC

## 2020-08-19 MED FILL — Levothyroxine Sodium Tab 50 MCG: ORAL | 90 days supply | Qty: 90 | Fill #0 | Status: AC

## 2020-08-21 ENCOUNTER — Other Ambulatory Visit (HOSPITAL_COMMUNITY): Payer: Self-pay

## 2020-08-27 ENCOUNTER — Other Ambulatory Visit (HOSPITAL_COMMUNITY): Payer: Self-pay

## 2020-08-27 DIAGNOSIS — E559 Vitamin D deficiency, unspecified: Secondary | ICD-10-CM | POA: Diagnosis not present

## 2020-08-27 DIAGNOSIS — R5383 Other fatigue: Secondary | ICD-10-CM | POA: Diagnosis not present

## 2020-08-27 DIAGNOSIS — E1165 Type 2 diabetes mellitus with hyperglycemia: Secondary | ICD-10-CM | POA: Diagnosis not present

## 2020-08-27 DIAGNOSIS — E1142 Type 2 diabetes mellitus with diabetic polyneuropathy: Secondary | ICD-10-CM | POA: Diagnosis not present

## 2020-08-27 DIAGNOSIS — Z114 Encounter for screening for human immunodeficiency virus [HIV]: Secondary | ICD-10-CM | POA: Diagnosis not present

## 2020-08-27 DIAGNOSIS — Z0000 Encounter for general adult medical examination without abnormal findings: Secondary | ICD-10-CM | POA: Diagnosis not present

## 2020-08-27 DIAGNOSIS — Z131 Encounter for screening for diabetes mellitus: Secondary | ICD-10-CM | POA: Diagnosis not present

## 2020-08-27 DIAGNOSIS — Z13220 Encounter for screening for lipoid disorders: Secondary | ICD-10-CM | POA: Diagnosis not present

## 2020-08-27 MED ORDER — OZEMPIC (0.25 OR 0.5 MG/DOSE) 2 MG/1.5ML ~~LOC~~ SOPN
PEN_INJECTOR | SUBCUTANEOUS | 5 refills | Status: DC
  Filled 2020-08-27: qty 1.5, 28d supply, fill #0

## 2020-08-27 MED ORDER — HYDROCODONE-ACETAMINOPHEN 10-325 MG PO TABS
1.0000 | ORAL_TABLET | Freq: Four times a day (QID) | ORAL | 0 refills | Status: DC | PRN
  Filled 2020-08-27: qty 120, 30d supply, fill #0

## 2020-08-27 MED ORDER — METFORMIN HCL 850 MG PO TABS
ORAL_TABLET | ORAL | 1 refills | Status: DC
  Filled 2020-08-27: qty 180, 90d supply, fill #0

## 2020-08-27 MED ORDER — GABAPENTIN 100 MG PO CAPS
ORAL_CAPSULE | ORAL | 2 refills | Status: DC
  Filled 2020-08-27: qty 120, 30d supply, fill #0

## 2020-08-30 ENCOUNTER — Other Ambulatory Visit (HOSPITAL_COMMUNITY): Payer: Self-pay

## 2020-08-30 MED FILL — Topiramate Tab 25 MG: ORAL | 30 days supply | Qty: 60 | Fill #0 | Status: AC

## 2020-08-30 MED FILL — Alprazolam Tab 1 MG: ORAL | 30 days supply | Qty: 60 | Fill #0 | Status: AC

## 2020-09-12 ENCOUNTER — Other Ambulatory Visit (HOSPITAL_COMMUNITY): Payer: Self-pay

## 2020-09-14 ENCOUNTER — Other Ambulatory Visit (HOSPITAL_COMMUNITY): Payer: Self-pay

## 2020-09-17 ENCOUNTER — Other Ambulatory Visit (HOSPITAL_COMMUNITY): Payer: Self-pay

## 2020-09-18 ENCOUNTER — Other Ambulatory Visit (HOSPITAL_COMMUNITY): Payer: Self-pay

## 2020-09-19 ENCOUNTER — Other Ambulatory Visit (HOSPITAL_COMMUNITY): Payer: Self-pay

## 2020-09-27 ENCOUNTER — Other Ambulatory Visit (HOSPITAL_COMMUNITY): Payer: Self-pay

## 2020-09-27 DIAGNOSIS — J40 Bronchitis, not specified as acute or chronic: Secondary | ICD-10-CM | POA: Diagnosis not present

## 2020-09-27 DIAGNOSIS — G893 Neoplasm related pain (acute) (chronic): Secondary | ICD-10-CM | POA: Diagnosis not present

## 2020-09-27 DIAGNOSIS — R635 Abnormal weight gain: Secondary | ICD-10-CM | POA: Diagnosis not present

## 2020-09-27 DIAGNOSIS — E1142 Type 2 diabetes mellitus with diabetic polyneuropathy: Secondary | ICD-10-CM | POA: Diagnosis not present

## 2020-09-27 MED ORDER — AZITHROMYCIN 250 MG PO TABS
ORAL_TABLET | ORAL | 0 refills | Status: DC
  Filled 2020-09-27: qty 6, 5d supply, fill #0

## 2020-09-27 MED ORDER — VENLAFAXINE HCL ER 150 MG PO CP24
ORAL_CAPSULE | ORAL | 1 refills | Status: DC
  Filled 2020-09-27: qty 90, 90d supply, fill #0
  Filled 2020-11-20 – 2021-02-03 (×2): qty 90, 90d supply, fill #1

## 2020-09-27 MED ORDER — ALPRAZOLAM 1 MG PO TABS
ORAL_TABLET | ORAL | 0 refills | Status: DC
  Filled 2020-09-27: qty 60, 30d supply, fill #0

## 2020-09-27 MED ORDER — GABAPENTIN 100 MG PO CAPS
ORAL_CAPSULE | ORAL | 0 refills | Status: DC
  Filled 2020-09-27: qty 120, 30d supply, fill #0

## 2020-09-27 MED ORDER — HYDROCODONE-ACETAMINOPHEN 10-325 MG PO TABS
1.0000 | ORAL_TABLET | Freq: Four times a day (QID) | ORAL | 0 refills | Status: DC | PRN
  Filled 2020-09-27: qty 120, 30d supply, fill #0

## 2020-10-29 ENCOUNTER — Other Ambulatory Visit (HOSPITAL_COMMUNITY): Payer: Self-pay

## 2020-10-29 DIAGNOSIS — G893 Neoplasm related pain (acute) (chronic): Secondary | ICD-10-CM | POA: Diagnosis not present

## 2020-10-29 DIAGNOSIS — Z20822 Contact with and (suspected) exposure to covid-19: Secondary | ICD-10-CM | POA: Diagnosis not present

## 2020-10-29 DIAGNOSIS — F411 Generalized anxiety disorder: Secondary | ICD-10-CM | POA: Diagnosis not present

## 2020-10-29 DIAGNOSIS — U071 COVID-19: Secondary | ICD-10-CM | POA: Diagnosis not present

## 2020-10-29 DIAGNOSIS — E1142 Type 2 diabetes mellitus with diabetic polyneuropathy: Secondary | ICD-10-CM | POA: Diagnosis not present

## 2020-10-29 MED ORDER — VITAMIN C 1000 MG PO TABS: ORAL_TABLET | ORAL | 0 refills | Status: DC

## 2020-10-29 MED ORDER — ZINC 50 MG PO TABS: ORAL_TABLET | ORAL | 0 refills | Status: DC

## 2020-10-29 MED ORDER — HYDROCODONE-ACETAMINOPHEN 10-325 MG PO TABS
1.0000 | ORAL_TABLET | Freq: Four times a day (QID) | ORAL | 0 refills | Status: DC | PRN
  Filled 2020-10-29: qty 120, 30d supply, fill #0

## 2020-10-29 MED ORDER — ALPRAZOLAM 1 MG PO TABS
1.0000 mg | ORAL_TABLET | Freq: Two times a day (BID) | ORAL | 0 refills | Status: DC | PRN
  Filled 2020-10-29: qty 60, 30d supply, fill #0

## 2020-10-29 MED ORDER — VITAMIN D 50 MCG (2000 UT) PO TABS: ORAL_TABLET | ORAL | 0 refills | Status: DC

## 2020-10-29 MED ORDER — PREDNISONE 5 MG (21) PO TBPK
ORAL_TABLET | ORAL | 0 refills | Status: DC
  Filled 2020-10-29: qty 21, 6d supply, fill #0

## 2020-11-19 ENCOUNTER — Other Ambulatory Visit (HOSPITAL_COMMUNITY): Payer: Self-pay

## 2020-11-19 MED FILL — Venlafaxine HCl Tab ER 24HR 225 MG (Base Equivalent): ORAL | 90 days supply | Qty: 90 | Fill #1 | Status: CN

## 2020-11-19 MED FILL — Metformin HCl Tab 850 MG: ORAL | 90 days supply | Qty: 180 | Fill #0 | Status: AC

## 2020-11-19 MED FILL — Pantoprazole Sodium EC Tab 40 MG (Base Equiv): ORAL | 90 days supply | Qty: 90 | Fill #1 | Status: AC

## 2020-11-19 MED FILL — Levothyroxine Sodium Tab 50 MCG: ORAL | 90 days supply | Qty: 90 | Fill #1 | Status: AC

## 2020-11-20 ENCOUNTER — Other Ambulatory Visit (HOSPITAL_COMMUNITY): Payer: Self-pay

## 2020-11-21 ENCOUNTER — Other Ambulatory Visit (HOSPITAL_COMMUNITY): Payer: Self-pay

## 2020-11-22 ENCOUNTER — Other Ambulatory Visit (HOSPITAL_COMMUNITY): Payer: Self-pay

## 2020-11-25 ENCOUNTER — Other Ambulatory Visit (HOSPITAL_COMMUNITY): Payer: Self-pay

## 2020-11-25 MED ORDER — ROSUVASTATIN CALCIUM 20 MG PO TABS
20.0000 mg | ORAL_TABLET | Freq: Every day | ORAL | 1 refills | Status: DC
  Filled 2020-11-25: qty 90, 90d supply, fill #0
  Filled 2021-02-17: qty 90, 90d supply, fill #1

## 2020-11-26 ENCOUNTER — Other Ambulatory Visit (HOSPITAL_COMMUNITY): Payer: Self-pay

## 2020-11-27 ENCOUNTER — Other Ambulatory Visit (HOSPITAL_COMMUNITY): Payer: Self-pay

## 2020-11-27 DIAGNOSIS — R635 Abnormal weight gain: Secondary | ICD-10-CM | POA: Diagnosis not present

## 2020-11-27 DIAGNOSIS — Z79899 Other long term (current) drug therapy: Secondary | ICD-10-CM | POA: Diagnosis not present

## 2020-11-27 DIAGNOSIS — E1142 Type 2 diabetes mellitus with diabetic polyneuropathy: Secondary | ICD-10-CM | POA: Diagnosis not present

## 2020-11-27 DIAGNOSIS — F411 Generalized anxiety disorder: Secondary | ICD-10-CM | POA: Diagnosis not present

## 2020-11-27 DIAGNOSIS — G893 Neoplasm related pain (acute) (chronic): Secondary | ICD-10-CM | POA: Diagnosis not present

## 2020-11-27 MED ORDER — GABAPENTIN 100 MG PO CAPS
ORAL_CAPSULE | ORAL | 5 refills | Status: DC
  Filled 2020-11-27: qty 120, 30d supply, fill #0
  Filled 2021-01-01: qty 120, 30d supply, fill #1

## 2020-11-27 MED ORDER — HYDROCODONE-ACETAMINOPHEN 10-325 MG PO TABS
1.0000 | ORAL_TABLET | Freq: Four times a day (QID) | ORAL | 0 refills | Status: DC | PRN
  Filled 2020-11-27: qty 120, 30d supply, fill #0

## 2020-11-27 MED ORDER — ALPRAZOLAM 1 MG PO TABS
ORAL_TABLET | ORAL | 2 refills | Status: DC
  Filled 2020-11-27: qty 60, 30d supply, fill #0
  Filled 2021-01-01: qty 60, 30d supply, fill #1
  Filled 2021-02-03: qty 60, 30d supply, fill #2

## 2020-12-09 DIAGNOSIS — M62838 Other muscle spasm: Secondary | ICD-10-CM | POA: Diagnosis not present

## 2020-12-09 DIAGNOSIS — A630 Anogenital (venereal) warts: Secondary | ICD-10-CM | POA: Diagnosis not present

## 2020-12-09 DIAGNOSIS — D013 Carcinoma in situ of anus and anal canal: Secondary | ICD-10-CM | POA: Insufficient documentation

## 2020-12-31 ENCOUNTER — Other Ambulatory Visit (HOSPITAL_COMMUNITY): Payer: Self-pay

## 2020-12-31 DIAGNOSIS — R635 Abnormal weight gain: Secondary | ICD-10-CM | POA: Diagnosis not present

## 2020-12-31 DIAGNOSIS — G893 Neoplasm related pain (acute) (chronic): Secondary | ICD-10-CM | POA: Diagnosis not present

## 2020-12-31 DIAGNOSIS — Z79891 Long term (current) use of opiate analgesic: Secondary | ICD-10-CM | POA: Diagnosis not present

## 2020-12-31 DIAGNOSIS — E1142 Type 2 diabetes mellitus with diabetic polyneuropathy: Secondary | ICD-10-CM | POA: Diagnosis not present

## 2020-12-31 MED ORDER — HYDROCODONE-ACETAMINOPHEN 10-325 MG PO TABS
1.0000 | ORAL_TABLET | Freq: Four times a day (QID) | ORAL | 0 refills | Status: DC | PRN
  Filled 2020-12-31: qty 120, 30d supply, fill #0

## 2021-01-01 ENCOUNTER — Other Ambulatory Visit (HOSPITAL_COMMUNITY): Payer: Self-pay

## 2021-01-30 ENCOUNTER — Other Ambulatory Visit (HOSPITAL_COMMUNITY): Payer: Self-pay

## 2021-01-30 DIAGNOSIS — E1142 Type 2 diabetes mellitus with diabetic polyneuropathy: Secondary | ICD-10-CM | POA: Diagnosis not present

## 2021-01-30 DIAGNOSIS — E559 Vitamin D deficiency, unspecified: Secondary | ICD-10-CM | POA: Diagnosis not present

## 2021-01-30 DIAGNOSIS — Z79899 Other long term (current) drug therapy: Secondary | ICD-10-CM | POA: Diagnosis not present

## 2021-01-30 DIAGNOSIS — R5383 Other fatigue: Secondary | ICD-10-CM | POA: Diagnosis not present

## 2021-01-30 DIAGNOSIS — E1165 Type 2 diabetes mellitus with hyperglycemia: Secondary | ICD-10-CM | POA: Diagnosis not present

## 2021-01-30 DIAGNOSIS — Z79891 Long term (current) use of opiate analgesic: Secondary | ICD-10-CM | POA: Diagnosis not present

## 2021-01-30 DIAGNOSIS — E782 Mixed hyperlipidemia: Secondary | ICD-10-CM | POA: Diagnosis not present

## 2021-01-30 DIAGNOSIS — G893 Neoplasm related pain (acute) (chronic): Secondary | ICD-10-CM | POA: Diagnosis not present

## 2021-01-30 MED ORDER — HYDROCODONE-ACETAMINOPHEN 10-325 MG PO TABS
1.0000 | ORAL_TABLET | Freq: Four times a day (QID) | ORAL | 0 refills | Status: DC | PRN
  Filled 2021-01-30: qty 120, 30d supply, fill #0

## 2021-02-03 ENCOUNTER — Other Ambulatory Visit (HOSPITAL_COMMUNITY): Payer: Self-pay

## 2021-02-03 MED FILL — Topiramate Tab 25 MG: ORAL | 30 days supply | Qty: 60 | Fill #1 | Status: AC

## 2021-02-17 ENCOUNTER — Other Ambulatory Visit (HOSPITAL_COMMUNITY): Payer: Self-pay

## 2021-02-17 MED FILL — Metformin HCl Tab 850 MG: ORAL | 90 days supply | Qty: 180 | Fill #1 | Status: AC

## 2021-02-17 MED FILL — Pantoprazole Sodium EC Tab 40 MG (Base Equiv): ORAL | 90 days supply | Qty: 90 | Fill #2 | Status: AC

## 2021-02-17 MED FILL — Levothyroxine Sodium Tab 50 MCG: ORAL | 90 days supply | Qty: 90 | Fill #2 | Status: AC

## 2021-03-04 ENCOUNTER — Ambulatory Visit (HOSPITAL_BASED_OUTPATIENT_CLINIC_OR_DEPARTMENT_OTHER): Payer: Self-pay | Admitting: Obstetrics & Gynecology

## 2021-03-04 ENCOUNTER — Other Ambulatory Visit (HOSPITAL_COMMUNITY): Payer: Self-pay

## 2021-03-04 DIAGNOSIS — G893 Neoplasm related pain (acute) (chronic): Secondary | ICD-10-CM | POA: Diagnosis not present

## 2021-03-04 DIAGNOSIS — E1140 Type 2 diabetes mellitus with diabetic neuropathy, unspecified: Secondary | ICD-10-CM | POA: Diagnosis not present

## 2021-03-04 DIAGNOSIS — Z79891 Long term (current) use of opiate analgesic: Secondary | ICD-10-CM | POA: Diagnosis not present

## 2021-03-04 DIAGNOSIS — Z79899 Other long term (current) drug therapy: Secondary | ICD-10-CM | POA: Diagnosis not present

## 2021-03-04 DIAGNOSIS — E1142 Type 2 diabetes mellitus with diabetic polyneuropathy: Secondary | ICD-10-CM | POA: Diagnosis not present

## 2021-03-04 MED ORDER — HYDROCODONE-ACETAMINOPHEN 10-325 MG PO TABS
1.0000 | ORAL_TABLET | Freq: Four times a day (QID) | ORAL | 0 refills | Status: DC | PRN
  Filled 2021-03-04: qty 120, 30d supply, fill #0

## 2021-03-04 MED ORDER — GABAPENTIN 100 MG PO CAPS
ORAL_CAPSULE | ORAL | 2 refills | Status: DC
  Filled 2021-03-04: qty 240, 30d supply, fill #0
  Filled 2021-04-14: qty 240, 30d supply, fill #1
  Filled 2021-05-11: qty 240, 30d supply, fill #2

## 2021-03-04 MED ORDER — OZEMPIC (0.25 OR 0.5 MG/DOSE) 2 MG/1.5ML ~~LOC~~ SOPN
PEN_INJECTOR | SUBCUTANEOUS | 5 refills | Status: DC
  Filled 2021-03-04: qty 1.5, 28d supply, fill #0
  Filled 2021-04-14: qty 1.5, 28d supply, fill #1
  Filled 2021-05-11: qty 1.5, 28d supply, fill #2
  Filled 2021-06-10: qty 1.5, 28d supply, fill #3
  Filled 2021-08-01: qty 1.5, 28d supply, fill #4

## 2021-03-08 ENCOUNTER — Other Ambulatory Visit (HOSPITAL_COMMUNITY): Payer: Self-pay

## 2021-03-11 ENCOUNTER — Other Ambulatory Visit (HOSPITAL_COMMUNITY): Payer: Self-pay

## 2021-03-14 ENCOUNTER — Other Ambulatory Visit (HOSPITAL_COMMUNITY): Payer: Self-pay

## 2021-03-18 ENCOUNTER — Other Ambulatory Visit (HOSPITAL_COMMUNITY): Payer: Self-pay

## 2021-03-18 MED ORDER — TOPIRAMATE 25 MG PO TABS
25.0000 mg | ORAL_TABLET | Freq: Two times a day (BID) | ORAL | 2 refills | Status: DC
  Filled 2021-03-18: qty 60, 30d supply, fill #0
  Filled 2021-04-16: qty 60, 30d supply, fill #1
  Filled 2021-05-11: qty 60, 30d supply, fill #2

## 2021-03-19 ENCOUNTER — Other Ambulatory Visit (HOSPITAL_COMMUNITY): Payer: Self-pay

## 2021-03-25 ENCOUNTER — Other Ambulatory Visit (HOSPITAL_COMMUNITY): Payer: Self-pay

## 2021-03-29 ENCOUNTER — Other Ambulatory Visit (HOSPITAL_COMMUNITY): Payer: Self-pay

## 2021-03-31 ENCOUNTER — Other Ambulatory Visit (HOSPITAL_COMMUNITY): Payer: Self-pay

## 2021-04-04 ENCOUNTER — Other Ambulatory Visit (HOSPITAL_COMMUNITY): Payer: Self-pay

## 2021-04-05 ENCOUNTER — Other Ambulatory Visit (HOSPITAL_COMMUNITY): Payer: Self-pay

## 2021-04-05 DIAGNOSIS — G893 Neoplasm related pain (acute) (chronic): Secondary | ICD-10-CM | POA: Diagnosis not present

## 2021-04-05 DIAGNOSIS — E1140 Type 2 diabetes mellitus with diabetic neuropathy, unspecified: Secondary | ICD-10-CM | POA: Diagnosis not present

## 2021-04-05 DIAGNOSIS — E1165 Type 2 diabetes mellitus with hyperglycemia: Secondary | ICD-10-CM | POA: Diagnosis not present

## 2021-04-05 DIAGNOSIS — E1142 Type 2 diabetes mellitus with diabetic polyneuropathy: Secondary | ICD-10-CM | POA: Diagnosis not present

## 2021-04-05 MED ORDER — ALPRAZOLAM 1 MG PO TABS
ORAL_TABLET | ORAL | 2 refills | Status: DC
  Filled 2021-04-05: qty 60, 30d supply, fill #0
  Filled 2021-05-11: qty 60, 30d supply, fill #1
  Filled 2021-06-10: qty 60, 30d supply, fill #2

## 2021-04-05 MED ORDER — HYDROCODONE-ACETAMINOPHEN 10-325 MG PO TABS
1.0000 | ORAL_TABLET | Freq: Four times a day (QID) | ORAL | 0 refills | Status: DC | PRN
  Filled 2021-04-05: qty 120, 30d supply, fill #0

## 2021-04-07 ENCOUNTER — Encounter (HOSPITAL_BASED_OUTPATIENT_CLINIC_OR_DEPARTMENT_OTHER): Payer: Self-pay | Admitting: Obstetrics & Gynecology

## 2021-04-07 ENCOUNTER — Other Ambulatory Visit: Payer: Self-pay

## 2021-04-07 ENCOUNTER — Ambulatory Visit (INDEPENDENT_AMBULATORY_CARE_PROVIDER_SITE_OTHER): Payer: 59 | Admitting: Obstetrics & Gynecology

## 2021-04-07 VITALS — BP 147/79 | HR 77 | Ht 64.0 in | Wt 244.2 lb

## 2021-04-07 DIAGNOSIS — F17200 Nicotine dependence, unspecified, uncomplicated: Secondary | ICD-10-CM

## 2021-04-07 DIAGNOSIS — D013 Carcinoma in situ of anus and anal canal: Secondary | ICD-10-CM

## 2021-04-07 DIAGNOSIS — J449 Chronic obstructive pulmonary disease, unspecified: Secondary | ICD-10-CM | POA: Diagnosis not present

## 2021-04-07 DIAGNOSIS — Z1211 Encounter for screening for malignant neoplasm of colon: Secondary | ICD-10-CM | POA: Diagnosis not present

## 2021-04-07 DIAGNOSIS — J45909 Unspecified asthma, uncomplicated: Secondary | ICD-10-CM | POA: Diagnosis not present

## 2021-04-07 DIAGNOSIS — M25552 Pain in left hip: Secondary | ICD-10-CM

## 2021-04-07 DIAGNOSIS — Z90710 Acquired absence of both cervix and uterus: Secondary | ICD-10-CM

## 2021-04-07 DIAGNOSIS — M25551 Pain in right hip: Secondary | ICD-10-CM

## 2021-04-07 DIAGNOSIS — Z803 Family history of malignant neoplasm of breast: Secondary | ICD-10-CM

## 2021-04-07 DIAGNOSIS — Z01419 Encounter for gynecological examination (general) (routine) without abnormal findings: Secondary | ICD-10-CM | POA: Diagnosis not present

## 2021-04-07 DIAGNOSIS — IMO0001 Reserved for inherently not codable concepts without codable children: Secondary | ICD-10-CM

## 2021-04-07 DIAGNOSIS — L02211 Cutaneous abscess of abdominal wall: Secondary | ICD-10-CM

## 2021-04-07 DIAGNOSIS — E039 Hypothyroidism, unspecified: Secondary | ICD-10-CM

## 2021-04-07 DIAGNOSIS — Z720 Tobacco use: Secondary | ICD-10-CM | POA: Insufficient documentation

## 2021-04-07 NOTE — Progress Notes (Signed)
50 y.o. [id number] Divorced White or Caucasian female here for annual exam.  Denies vaginal bleeding.  Her mother accompanies her today.    Having some issues with recurrent skin abscesses.  Has a fairly sizable one underneath breat on left side.  Is tender to touch.  Does have drainage.  Had on on right breast like this but it finally healed.    Having a lot of issues with bilateral hip pain.  Pain can be in either or both hips.  Internal and external rotation hurt the most.  She does hurt with extended standing.  Does have issues with walking when there is a lot of pain and can feel like her legs are going to give out.  No position is comfortable when she is in a lot of pain--sitting, standing or lying down.  Pt has been followed at pain clinic at [facility].  Is seeing Emelia Loron there now.  She takes norco 4 times a day.  Has not had any evaluation on her hips.  Would like referral.  Has had recent blood work.  hbA1C was 7.4.  Ozempic was added.  Is giving her nausea.    Continues to have follow up and treatment for AIN, condyloma.  Followed by Dr. Marcello Moores.  Has lesion removed 11/2020 that was a squamous cell carcinoma in situ.    No LMP recorded. Patient has had a hysterectomy.          Sexually active: Yes.    Exercising: No.   Smoker:  yes  Health Maintenance: Pap:  03/24/2019 negative History of abnormal Pap:  yes MMG:  08/02/2020 negative Colonoscopy:  referral placed Screening Labs: Emelia Loron, NP   reports that she has been smoking cigarettes. She has a 30.00 pack-year smoking history. She has never used smokeless tobacco. She reports that she does not currently use alcohol. She reports that she does not use drugs.  Past Medical History:  Diagnosis Date   Anal polyp    Anxiety    Arthritis    Right hip   Chronic pain    followed by pain clinic   Complication of anesthesia    post op urinary retention   COPD with asthma ([place]) followed by pcp and as needed [facility]   hx exacerbation's  --- (11-29-2019  per last exacerbation >2 years;  last used rescue inhaler a week ago)   DOE (dyspnea on exertion)    11-29-2019  per pt going flight of stairs but normal daily activites no sob   Family history of adverse reaction to anesthesia    mother -- ponv ;  per pt in 04/ 2021 father had cardiac arrest and Atrial fib intraoperative for circumcision (this was in [place]) put on venitator for two days then had nuclear stress test per pt showed disease but no cath done, still  has atrial fib;  she also stated prior to this he known cardiac disease with no intervention   GERD (gastroesophageal reflux disease)    Hemorrhoids    History of acute respiratory failure    06/ 2015  and 12/ 2016  CAP and Asthma exacerbation--  vented both times (ARDS)   History of anal dysplasia    AIN 2/ 3     History of cervical dysplasia    CIN 2 in 2000   History of chest pain    11-29-2019 per pt had cardiology evaluation for chest pain (but pt stated has not had any chest pain  or cardiac symtpoms since) referred by pcp  w/ dr Johnsie Cancel note in epic 05-22-2019;  pt had nuclear stress test the showed normal perfusion no ischemia and ef >65% ,  also had echo same day showed normal w/ ef 65%   History of hypothyroidism    History of pneumonia    hx Required ventilatory support   History of TIA (transient ischemic attack)    Caused by medication reaction with combination of wellbutrin and estrogen   Migraines    OSA on CPAP    11-29-2019 pt stated uses every night   Type 2 diabetes mellitus ([place])    followed by pcp  (11-29-2019 pt stated does not check blood sugar)   Wears glasses     Past Surgical History:  Procedure Laterality Date   CESAREAN SECTION  5790   CO2 LASER APPLICATION N/A 3/83/3383   Procedure: CO2 LASER APPLICATION;  Surgeon: Leighton Ruff, MD;  Location: [place];  Service: General;  Laterality: N/A;   CONIZATION OF CERVIX  2000    DILATION AND CURETTAGE OF UTERUS     EVALUATION UNDER ANESTHESIA WITH HEMORRHOIDECTOMY N/A 06/15/2014   Procedure: EXAM UNDER ANESTHESIA WITH HEMORRHOIDECTOMY;  Surgeon: Doreen Salvage, MD;  Location: [place];  Service: General;  Laterality: N/A;   EXCISION OF SKIN TAG N/A 06/15/2014   Procedure: EXCISION OF SKIN TAGS;  Surgeon: Doreen Salvage, MD;  Location: [facility];  Service: General;  Laterality: N/A;   HIGH RESOLUTION ANOSCOPY N/A 10/11/2014   Procedure: HIGH RESOLUTION ANOSCOPY WITH BIOPSY;  Surgeon: Leighton Ruff, MD;  Location: [place];  Service: General;  Laterality: N/A;   HYSTEROSCOPY WITH D & C  09/12/2007  &  2005   MASS EXCISION N/A 12/07/2019   Procedure: EXCISION OF ANAL POLYP AND PERIANAL MOLE;  Surgeon: Leighton Ruff, MD;  Location: [place];  Service: General;  Laterality: N/A;   RECTAL EXAM UNDER ANESTHESIA N/A 10/11/2014   Procedure: ANAL EXAM UNDER ANESTHESIA;  Surgeon: Leighton Ruff, MD;  Location: [place];  Service: General;  Laterality: N/A;   RECTAL EXAM UNDER ANESTHESIA N/A 12/18/2015   Procedure: ANAL EXAM UNDER ANESTHESIA  EXCISION ANAL MARGIN LESION;  Surgeon: Leighton Ruff, MD;  Location: [place];  Service: General;  Laterality: N/A;   RECTAL EXAM UNDER ANESTHESIA N/A 12/07/2019   Procedure: ANAL EXAM UNDER ANESTHESIA;  Surgeon: Leighton Ruff, MD;  Location: [facility] [place];  Service: General;  Laterality: N/A;   ROBOTIC ASSISTED TOTAL HYSTERECTOMY N/A 10/11/2012   Procedure: ROBOTIC ASSISTED TOTAL HYSTERECTOMY;  Surgeon: Lyman Speller, MD;  Location: [facility];  Service: Gynecology;  Laterality: N/A;   SALPINGOOPHORECTOMY Bilateral 10/11/2012   Procedure: SALPINGO OOPHORECTOMY;  Surgeon: Lyman Speller, MD;  Location: [facility];  Service: Gynecology;  Laterality: Bilateral;   TRANSTHORACIC ECHOCARDIOGRAM  05/07/2015   lvsf vigorous, ef 70-75%/  trivial MR and  TR   VIDEO ASSISTED THORACOSCOPY (VATS)/THOROCOTOMY Right 11/04/2001   w/ Resection duplication esophageal cyst    Current Outpatient Medications  Medication Sig Dispense Refill   albuterol (PROVENTIL HFA;VENTOLIN HFA) 108 (90 BASE) MCG/ACT inhaler Inhale 2 puffs into the lungs every 6 (six) hours as needed. wheezing 1 Inhaler 11   albuterol (PROVENTIL) (5 MG/ML) 0.5% nebulizer solution Take 2.5 mg by nebulization every 6 (six) hours as needed for wheezing or shortness of breath.     aspirin EC 81 MG tablet Take 81 mg  by mouth daily.     Cholecalciferol (VITAMIN D) 50 MCG (2000 UT) tablet Take 1 (one) Tablet by mouth daily 10 tablet 0   famotidine (PEPCID) 20 MG tablet Take 20 mg by mouth at bedtime.      gabapentin (NEURONTIN) 100 MG capsule Take 2 capsules by mouth 4 times daily 240 capsule 2   HYDROcodone-acetaminophen (NORCO) 10-325 MG tablet Take 1 tablet by mouth every 6 hours as needed for pain 120 tablet 0   lidocaine (XYLOCAINE) 2 % jelly Apply 1 application topically every 6 (six) hours as needed (for anal pain).   3   loratadine (CLARITIN) 10 MG tablet Take 10 mg by mouth at bedtime.      Multiple Vitamins-Calcium (ONE-A-DAY WOMENS FORMULA PO) Take by mouth daily.      Omega-3 Fatty Acids (OMEGA-3 FISH OIL PO) Take 720 mg by mouth in the morning and at bedtime.     pantoprazole (PROTONIX) 40 MG tablet TAKE 1 TABLET BY MOUTH DAILY 90 tablet 3   polyethylene glycol (MIRALAX / GLYCOLAX) packet Take 17 g by mouth daily as needed for mild constipation.     rosuvastatin (CRESTOR) 20 MG tablet Take 1 tablet by mouth at bedtime 90 tablet 1   topiramate (TOPAMAX) 25 MG tablet Take 1 tablet by mouth two times daily 60 tablet 2   venlafaxine XR (EFFEXOR-XR) 150 MG 24 hr capsule Take 1 (one) Capsule by mouth daily 90 capsule 1   ALPRAZolam (XANAX) 1 MG tablet Take 1 (one) tablet by mouth two times daily, as needed (Patient not taking: Reported on 04/07/2021) 60 tablet 2   Ascorbic Acid  (VITAMIN C) 1000 MG tablet Take 2 (two) Tablet by mouth daily (Patient not taking: Reported on 04/07/2021) 20 tablet 0   levothyroxine (SYNTHROID) 50 MCG tablet TAKE 1 TABLET BY MOUTH DAILY (Patient not taking: Reported on 04/07/2021) 90 tablet 3   metFORMIN (GLUCOPHAGE) 850 MG tablet Take 1 tablet by mouth two times daily (Patient not taking: Reported on 04/07/2021) 180 tablet 1   Semaglutide,0.25 or 0.5MG /DOS, (OZEMPIC, 0.25 OR 0.5 MG/DOSE,) 2 MG/1.5ML SOPN Inject 0.5 mg under the skin once a week (Patient not taking: Reported on 04/07/2021) 1.5 mL 5   Zinc 50 MG TABS Take 1 (one) Tablet by mouth daily (Patient not taking: Reported on 04/07/2021) 10 tablet 0   No current facility-administered medications for this visit.    Family History  Problem Relation Age of Onset   Breast cancer Brother 43   Breast cancer Maternal Aunt    Depression Mother    Anesthesia problems Mother        post-op N/V   Alcohol abuse Father    Alcohol abuse Cousin     Review of Systems  Constitutional:  Positive for fatigue.  Genitourinary: Negative.   Musculoskeletal:        Hip pain   Exam:   BP (!) 147/79   Pulse 77   Ht 5\' 4"  (1.626 m)   Wt 244 lb 3.2 oz (110.8 kg)   BMI 41.92 kg/m   Height: 5\' 4"  (162.6 cm)  General appearance: alert, cooperative and appears stated age Head: Normocephalic, without obvious abnormality, atraumatic Neck: no adenopathy, supple, symmetrical, trachea midline and thyroid normal to inspection and palpation Lungs: clear to auscultation bilaterally Breasts: no masses, breast skin changes, LAD or nipple discharge on either side.  Beneath the left breast is a 2-3 cm area with scab but surrounding erythema and drainage with compression  of lesion.  Wound culture obtained. Heart: regular rate and rhythm Abdomen: soft, non-tender; bowel sounds normal; no masses,  no organomegaly Extremities: extremities normal, atraumatic, no cyanosis or edema Skin: Skin color, texture,  turgor normal. No rashes or lesions Lymph nodes: Cervical, supraclavicular, and axillary nodes normal. No abnormal inguinal nodes palpated Neurologic: Grossly normal   Pelvic: External genitalia:  no lesions              Urethra:  normal appearing urethra with no masses, tenderness or lesions              Bartholins and Skenes: normal                 Vagina: normal appearing vagina with normal color and no discharge, no lesions              Cervix: absent              Pap taken: No. Bimanual Exam:  Uterus:  uterus absent              Adnexa: no mass, fullness, tenderness               Rectovaginal: Confirms               Anus:  normal sphincter tone, no lesions  Chaperone, Octaviano Batty, CMA, was present for exam.  Assessment/Plan: 1. Well woman exam with routine gynecological exam - pap smear with neg HR HPV 2020.  Will repeat next year. - MMG 08/02/2020 - colonoscopy referral placed - lab work done with Emelia Loron, NP, at [facility]  2. Family history of breast cancer (in brother) with increased lifetime risk of breast cancer of 25% - pt had negative genetic testing - declined breast MRI  3. H/O: hysterectomy - TLH/bilateral salpingectomy 5/14 due to hx of recurrent abnormal pap smears and h/o LEEP  4. Bilateral hip pain - Ambulatory referral to Orthopedic Surgery  5. Colon cancer screening - Ambulatory referral to Gastroenterology  6. Cutaneous abscess of abdominal wall - would like to start pt on bactrim DS bid x 7 days.  Have left message for Emelia Loron, NP, to ensure this is ok.  Request made by pt to call him prior to prescribing anything - WOUND CULTURE  7. AIN grade III - followed by Dr. Henri Medal  8. Intrinsic asthma  9. Chronic obstructive pulmonary disease, unspecified COPD type ([place])  10. Smoking  11. Acquired hypothyroidism

## 2021-04-08 ENCOUNTER — Other Ambulatory Visit (HOSPITAL_COMMUNITY): Payer: Self-pay

## 2021-04-11 LAB — WOUND CULTURE: Organism ID, Bacteria: NONE SEEN

## 2021-04-14 ENCOUNTER — Other Ambulatory Visit (HOSPITAL_COMMUNITY): Payer: Self-pay

## 2021-04-14 ENCOUNTER — Other Ambulatory Visit (HOSPITAL_BASED_OUTPATIENT_CLINIC_OR_DEPARTMENT_OTHER): Payer: Self-pay | Admitting: Obstetrics & Gynecology

## 2021-04-14 MED ORDER — FLUCONAZOLE 150 MG PO TABS
150.0000 mg | ORAL_TABLET | Freq: Once | ORAL | 0 refills | Status: AC
  Filled 2021-04-14: qty 2, 2d supply, fill #0

## 2021-04-14 MED ORDER — SULFAMETHOXAZOLE-TRIMETHOPRIM 800-160 MG PO TABS
1.0000 | ORAL_TABLET | Freq: Two times a day (BID) | ORAL | 0 refills | Status: DC
  Filled 2021-04-14: qty 20, 10d supply, fill #0

## 2021-04-16 ENCOUNTER — Other Ambulatory Visit (HOSPITAL_COMMUNITY): Payer: Self-pay

## 2021-04-28 ENCOUNTER — Ambulatory Visit: Payer: 59 | Admitting: Orthopaedic Surgery

## 2021-04-28 ENCOUNTER — Ambulatory Visit (INDEPENDENT_AMBULATORY_CARE_PROVIDER_SITE_OTHER): Payer: 59

## 2021-04-28 ENCOUNTER — Other Ambulatory Visit: Payer: Self-pay

## 2021-04-28 VITALS — Ht 63.0 in | Wt 248.0 lb

## 2021-04-28 DIAGNOSIS — M25551 Pain in right hip: Secondary | ICD-10-CM | POA: Diagnosis not present

## 2021-04-28 DIAGNOSIS — M25552 Pain in left hip: Secondary | ICD-10-CM

## 2021-04-28 NOTE — Progress Notes (Signed)
Office Visit Note   Patient: Denise Ray           Date of Birth: [birth year]           MRN: [id number] Visit Date: 04/28/2021              Requested by: Megan Salon, MD [address] [place],  [place] [address] PCP: Emelia Loron, NP   Assessment & Plan: Visit Diagnoses:  1. Pain in left hip   2. Pain in right hip     Plan: I talked her in length in detail about her hips.  This certainly could still be a hip issue or a spine issue.  Fortunately she does not need any type of surgical intervention from my standpoint.  I would like to send her to outpatient physical therapy for core strengthening and back strengthening as well as any modalities that can help decrease her hip pain.  They should also work on hip strengthening exercises.  All questions and concerns were answered and addressed.  She agrees with this treatment plan.  I will then see her back in about 6 weeks to see how she is doing overall.  We may explore this further with other imaging studies such as MRI of either the pelvis or lumbar spine depending on her progress with therapy.  Follow-Up Instructions: Return in about 6 weeks (around 06/09/2021).   Orders:  Orders Placed This Encounter  Procedures   XR HIPS BILAT W OR W/O PELVIS 2V   No orders of the defined types were placed in this encounter.     Procedures: No procedures performed   Clinical Data: No additional findings.   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  She comes in for the bilateral hip pain for several months now with pain in the groin on both sides.  She says about 4 out of 10 pain.  She is on her feet working all day long.  She feels like it is worsening.  She does deal with chronic pain from previous rectal cancer.  She has been on chemotherapy and medication because of that which she is  off of now.  She is in chronic pain management.  She denies any radicular components of her pain and denies any specific injury.  She does feel like there is worsening and she does have what she describes as potentially instability of her hips in terms of just how she feels overall.  She has never had surgery on her hips or her back.  HPI  Review of Systems Today she denies any fever, chills, nausea, vomiting  Objective: Vital Signs: Ht 5\' 3"  (1.6 m)   Wt 248 lb (112.5 kg)   BMI 43.93 kg/m   Physical Exam She is alert and oriented x3 and in no acute distress Ortho Exam Examination today of both her hips are entirely normal.  Both hips have fluid internal and external rotation with no blocks to rotation.  There is no pain in the groin with either hip or rotation.  There is no pain to palpation of the trochanteric area of either hip. Specialty Comments:  No specialty comments available.  Imaging: XR HIPS BILAT W OR W/O PELVIS 2V  Result Date: 04/28/2021 An AP pelvis and lateral both hips are normal.  The hip joint space  is well-maintained.  There is no cortical irregularities in light of the patient's cancer history.    PMFS History: Patient Active Problem List   Diagnosis Date Noted   Family history of breast cancer 04/07/2021   AIN grade III 04/07/2021   Chronic obstructive pulmonary disease ([place]) 04/07/2021   Smoking 04/07/2021   Anogenital warts 12/09/2020   Former smoker 03/24/2017   Anxiety and depression 03/24/2017   Chronic pain 03/24/2017   Gastroesophageal reflux disease 12/13/2016   Cough 10/30/2016   Diabetes ([place]) 09/11/2016   Bradycardia 05/07/2015   Intrinsic asthma 11/10/2013   OSA (obstructive sleep apnea) 11/10/2013   History of acute respiratory failure 10/31/2013   History of respiratory failure 10/31/2013   History of pneumonia 10/26/2013   Asthma with acute exacerbation 10/26/2013   Acquired hypothyroidism 10/26/2013   Obesity, unspecified  10/26/2013   PTSD (post-traumatic stress disorder) 01/18/2013   Depression, major, recurrent ([place]) 01/18/2013   Past Medical History:  Diagnosis Date   Anal polyp    Anxiety    Arthritis    Right hip   Chronic pain    followed by pain clinic   Complication of anesthesia    post op urinary retention   COPD with asthma ([place]) followed by pcp and as needed [facility]   hx exacerbation's  --- (11-29-2019  per last exacerbation >2 years;  last used rescue inhaler a week ago)   DOE (dyspnea on exertion)    11-29-2019  per pt going flight of stairs but normal daily activites no sob   Family history of adverse reaction to anesthesia    mother -- ponv ;  per pt in 04/ 2021 father had cardiac arrest and Atrial fib intraoperative for circumcision (this was in [place]) put on venitator for two days then had nuclear stress test per pt showed disease but no cath done, still  has atrial fib;  she also stated prior to this he known cardiac disease with no intervention   GERD (gastroesophageal reflux disease)    Hemorrhoids    History of acute respiratory failure    06/ 2015  and 12/ 2016  CAP and Asthma exacerbation--  vented both times (ARDS)   History of anal dysplasia    AIN 2/ 3     History of cervical dysplasia    CIN 2 in 2000   History of chest pain    11-29-2019 per pt had cardiology evaluation for chest pain (but pt stated has not had any chest pain or cardiac symtpoms since) referred by pcp  w/ dr Johnsie Cancel note in epic 05-22-2019;  pt had nuclear stress test the showed normal perfusion no ischemia and ef >65% ,  also had echo same day showed normal w/ ef 65%   History of hypothyroidism    History of pneumonia    hx Required ventilatory support   History of TIA (transient ischemic attack)    Caused by medication reaction with combination of wellbutrin and estrogen   Migraines    OSA on CPAP    11-29-2019 pt stated uses every night   Type 2 diabetes mellitus ([place])    followed  by pcp  (11-29-2019 pt stated does not check blood sugar)   Wears glasses     Family History  Problem Relation Age of Onset   Breast cancer Brother 93   Breast cancer Maternal Aunt    Depression Mother    Anesthesia problems Mother  post-op N/V   Alcohol abuse Father    Alcohol abuse Cousin     Past Surgical History:  Procedure Laterality Date   CESAREAN SECTION  1062   CO2 LASER APPLICATION N/A 6/94/8546   Procedure: CO2 LASER APPLICATION;  Surgeon: Leighton Ruff, MD;  Location: [place] [facility];  Service: General;  Laterality: N/A;   CONIZATION OF CERVIX  2000   DILATION AND CURETTAGE OF UTERUS     EVALUATION UNDER ANESTHESIA WITH HEMORRHOIDECTOMY N/A 06/15/2014   Procedure: EXAM UNDER ANESTHESIA WITH HEMORRHOIDECTOMY;  Surgeon: Doreen Salvage, MD;  Location: [place];  Service: General;  Laterality: N/A;   EXCISION OF SKIN TAG N/A 06/15/2014   Procedure: EXCISION OF SKIN TAGS;  Surgeon: Doreen Salvage, MD;  Location: [place];  Service: General;  Laterality: N/A;   HIGH RESOLUTION ANOSCOPY N/A 10/11/2014   Procedure: HIGH RESOLUTION ANOSCOPY WITH BIOPSY;  Surgeon: Leighton Ruff, MD;  Location: [place];  Service: General;  Laterality: N/A;   HYSTEROSCOPY WITH D & C  09/12/2007  &  2005   MASS EXCISION N/A 12/07/2019   Procedure: EXCISION OF ANAL POLYP AND PERIANAL MOLE;  Surgeon: Leighton Ruff, MD;  Location: [place];  Service: General;  Laterality: N/A;   RECTAL EXAM UNDER ANESTHESIA N/A 10/11/2014   Procedure: ANAL EXAM UNDER ANESTHESIA;  Surgeon: Leighton Ruff, MD;  Location: [place];  Service: General;  Laterality: N/A;   RECTAL EXAM UNDER ANESTHESIA N/A 12/18/2015   Procedure: ANAL EXAM UNDER ANESTHESIA  EXCISION ANAL MARGIN LESION;  Surgeon: Leighton Ruff, MD;  Location: [place];  Service: General;  Laterality: N/A;   RECTAL EXAM UNDER ANESTHESIA N/A 12/07/2019    Procedure: ANAL EXAM UNDER ANESTHESIA;  Surgeon: Leighton Ruff, MD;  Location: [place];  Service: General;  Laterality: N/A;   ROBOTIC ASSISTED TOTAL HYSTERECTOMY N/A 10/11/2012   Procedure: ROBOTIC ASSISTED TOTAL HYSTERECTOMY;  Surgeon: Lyman Speller, MD;  Location: [facility];  Service: Gynecology;  Laterality: N/A;   SALPINGOOPHORECTOMY Bilateral 10/11/2012   Procedure: SALPINGO OOPHORECTOMY;  Surgeon: Lyman Speller, MD;  Location: [facility];  Service: Gynecology;  Laterality: Bilateral;   TRANSTHORACIC ECHOCARDIOGRAM  05/07/2015   lvsf vigorous, ef 70-75%/  trivial MR and TR   VIDEO ASSISTED THORACOSCOPY (VATS)/THOROCOTOMY Right 11/04/2001   w/ Resection duplication esophageal cyst   Social History   Occupational History   Occupation: Microbiologist: [employer]  Tobacco Use   Smoking status: Every Day    Packs/day: 1.00    Years: 30.00    Pack years: 30.00    Types: Cigarettes   Smokeless tobacco: Never  Vaping Use   Vaping Use: Never used  Substance and Sexual Activity   Alcohol use: Not Currently    Alcohol/week: 0.0 standard drinks    Comment: Rarely   Drug use: Never   Sexual activity: Yes    Partners: Male    Birth control/protection: Surgical    Comment: Hysterectomy

## 2021-04-30 ENCOUNTER — Telehealth: Payer: Self-pay | Admitting: Orthopaedic Surgery

## 2021-04-30 ENCOUNTER — Other Ambulatory Visit: Payer: Self-pay

## 2021-04-30 DIAGNOSIS — M79604 Pain in right leg: Secondary | ICD-10-CM

## 2021-04-30 DIAGNOSIS — M25551 Pain in right hip: Secondary | ICD-10-CM

## 2021-04-30 NOTE — Telephone Encounter (Signed)
LMOM for patient of the below message that we could order these MRI's, if she would like me to do so to call back and I will get those ordered for her

## 2021-04-30 NOTE — Telephone Encounter (Signed)
MRIs ordered.

## 2021-04-30 NOTE — Telephone Encounter (Signed)
Sent referrals for MRI's

## 2021-04-30 NOTE — Telephone Encounter (Signed)
Patient was seen on Monday and now reports her condition has gotten worse. She isn't able to walk without a walker and function in her left leg has decreased. Please follow up!

## 2021-04-30 NOTE — Telephone Encounter (Signed)
Patient called back stating she is okay with getting the MRI.

## 2021-05-04 DIAGNOSIS — E1165 Type 2 diabetes mellitus with hyperglycemia: Secondary | ICD-10-CM | POA: Diagnosis not present

## 2021-05-04 DIAGNOSIS — R059 Cough, unspecified: Secondary | ICD-10-CM | POA: Diagnosis not present

## 2021-05-04 DIAGNOSIS — E1142 Type 2 diabetes mellitus with diabetic polyneuropathy: Secondary | ICD-10-CM | POA: Diagnosis not present

## 2021-05-04 DIAGNOSIS — E1140 Type 2 diabetes mellitus with diabetic neuropathy, unspecified: Secondary | ICD-10-CM | POA: Diagnosis not present

## 2021-05-04 DIAGNOSIS — G893 Neoplasm related pain (acute) (chronic): Secondary | ICD-10-CM | POA: Diagnosis not present

## 2021-05-05 ENCOUNTER — Other Ambulatory Visit (HOSPITAL_COMMUNITY): Payer: Self-pay

## 2021-05-05 MED ORDER — VENLAFAXINE HCL ER 150 MG PO CP24
ORAL_CAPSULE | ORAL | 1 refills | Status: DC
  Filled 2021-05-05: qty 90, 90d supply, fill #0
  Filled 2021-08-01: qty 90, 90d supply, fill #1

## 2021-05-05 MED ORDER — HYDROCODONE-ACETAMINOPHEN 10-325 MG PO TABS
1.0000 | ORAL_TABLET | Freq: Four times a day (QID) | ORAL | 0 refills | Status: DC | PRN
  Filled 2021-05-05: qty 120, 30d supply, fill #0

## 2021-05-05 MED ORDER — PANTOPRAZOLE SODIUM 40 MG PO TBEC
40.0000 mg | DELAYED_RELEASE_TABLET | Freq: Every day | ORAL | 3 refills | Status: DC
  Filled 2021-05-05: qty 90, 90d supply, fill #0
  Filled 2021-08-01: qty 90, 90d supply, fill #1
  Filled 2021-10-23: qty 90, 90d supply, fill #2
  Filled 2022-01-14: qty 90, 90d supply, fill #3

## 2021-05-05 MED ORDER — ROSUVASTATIN CALCIUM 20 MG PO TABS
20.0000 mg | ORAL_TABLET | Freq: Every day | ORAL | 1 refills | Status: DC
  Filled 2021-05-05: qty 90, 90d supply, fill #0
  Filled 2021-08-01: qty 90, 90d supply, fill #1

## 2021-05-05 MED ORDER — LEVOTHYROXINE SODIUM 50 MCG PO TABS
50.0000 ug | ORAL_TABLET | Freq: Every day | ORAL | 3 refills | Status: DC
  Filled 2021-05-05: qty 90, 90d supply, fill #0
  Filled 2021-08-01: qty 90, 90d supply, fill #1
  Filled 2021-10-23: qty 90, 90d supply, fill #2
  Filled 2022-01-14: qty 90, 90d supply, fill #3

## 2021-05-05 MED ORDER — METFORMIN HCL 850 MG PO TABS
ORAL_TABLET | ORAL | 1 refills | Status: DC
  Filled 2021-05-05: qty 180, 90d supply, fill #0
  Filled 2021-08-01: qty 180, 90d supply, fill #1

## 2021-05-06 ENCOUNTER — Telehealth: Payer: Self-pay | Admitting: Orthopaedic Surgery

## 2021-05-06 NOTE — Telephone Encounter (Signed)
Patient has submitted [employer] forms. Please advise work status and provide note. Thanks.

## 2021-05-06 NOTE — Telephone Encounter (Signed)
Documentation noted for [employer].

## 2021-05-11 ENCOUNTER — Other Ambulatory Visit (HOSPITAL_COMMUNITY): Payer: Self-pay

## 2021-05-13 ENCOUNTER — Other Ambulatory Visit: Payer: Self-pay

## 2021-05-13 ENCOUNTER — Ambulatory Visit: Payer: 59 | Attending: Orthopaedic Surgery

## 2021-05-13 ENCOUNTER — Other Ambulatory Visit (HOSPITAL_COMMUNITY): Payer: Self-pay

## 2021-05-13 DIAGNOSIS — M25551 Pain in right hip: Secondary | ICD-10-CM | POA: Diagnosis not present

## 2021-05-13 DIAGNOSIS — R262 Difficulty in walking, not elsewhere classified: Secondary | ICD-10-CM | POA: Insufficient documentation

## 2021-05-13 DIAGNOSIS — M25552 Pain in left hip: Secondary | ICD-10-CM | POA: Insufficient documentation

## 2021-05-13 NOTE — Therapy (Signed)
[facility] [place], [place], [address] Phone: [phone]   Fax:  [phone]  Physical Therapy Evaluation  Patient Details  Name: Denise Ray MRN: [id number] Date of Birth: 07-06-1970 Referring Provider (PT): Ninfa Linden, MD   Encounter Date: 05/13/2021   PT End of Session - 05/13/21 1438     Visit Number 1    Number of Visits 10    Date for PT Re-Evaluation 08/01/21    PT Start Time 4034    PT Stop Time 7425    PT Time Calculation (min) 44 min    Activity Tolerance Patient tolerated treatment well    Behavior During Therapy [facility] for tasks assessed/performed             Past Medical History:  Diagnosis Date   Anal polyp    Anxiety    Arthritis    Right hip   Chronic pain    followed by pain clinic   Complication of anesthesia    post op urinary retention   COPD with asthma ([place]) followed by pcp and as needed [facility]   hx exacerbation's  --- (11-29-2019  per last exacerbation >2 years;  last used rescue inhaler a week ago)   DOE (dyspnea on exertion)    11-29-2019  per pt going flight of stairs but normal daily activites no sob   Family history of adverse reaction to anesthesia    mother -- ponv ;  per pt in 04/ 2021 father had cardiac arrest and Atrial fib intraoperative for circumcision (this was in [place]) put on venitator for two days then had nuclear stress test per pt showed disease but no cath done, still  has atrial fib;  she also stated prior to this he known cardiac disease with no intervention   GERD (gastroesophageal reflux disease)    Hemorrhoids    History of acute respiratory failure    06/ 2015  and 12/ 2016  CAP and Asthma exacerbation--  vented both times (ARDS)   History of anal dysplasia    AIN 2/ 3     History of cervical dysplasia    CIN 2 in 2000   History of chest pain    11-29-2019 per pt had cardiology evaluation for chest pain (but pt stated has not had any  chest pain or cardiac symtpoms since) referred by pcp  w/ dr Johnsie Cancel note in epic 05-22-2019;  pt had nuclear stress test the showed normal perfusion no ischemia and ef >65% ,  also had echo same day showed normal w/ ef 65%   History of hypothyroidism    History of pneumonia    hx Required ventilatory support   History of TIA (transient ischemic attack)    Caused by medication reaction with combination of wellbutrin and estrogen   Migraines    OSA on CPAP    11-29-2019 pt stated uses every night   Type 2 diabetes mellitus ([place])    followed by pcp  (11-29-2019 pt stated does not check blood sugar)   Wears glasses     Past Surgical History:  Procedure Laterality Date   CESAREAN SECTION  9563   CO2 LASER APPLICATION N/A 8/75/6433   Procedure: CO2 LASER APPLICATION;  Surgeon: Leighton Ruff, MD;  Location: [place];  Service: General;  Laterality: N/A;   CONIZATION OF CERVIX  2000   DILATION AND CURETTAGE OF UTERUS     EVALUATION UNDER ANESTHESIA WITH HEMORRHOIDECTOMY N/A 06/15/2014  Procedure: EXAM UNDER ANESTHESIA WITH HEMORRHOIDECTOMY;  Surgeon: Doreen Salvage, MD;  Location: [place];  Service: General;  Laterality: N/A;   EXCISION OF SKIN TAG N/A 06/15/2014   Procedure: EXCISION OF SKIN TAGS;  Surgeon: Doreen Salvage, MD;  Location: [place];  Service: General;  Laterality: N/A;   HIGH RESOLUTION ANOSCOPY N/A 10/11/2014   Procedure: HIGH RESOLUTION ANOSCOPY WITH BIOPSY;  Surgeon: Leighton Ruff, MD;  [facility];  Service: General;  Laterality: N/A;   HYSTEROSCOPY WITH D & C  09/12/2007  &  2005   MASS EXCISION N/A 12/07/2019   Procedure: EXCISION OF ANAL POLYP AND PERIANAL MOLE;  Surgeon: Leighton Ruff, MD;  Location: [facility];  Service: General;  Laterality: N/A;   RECTAL EXAM UNDER ANESTHESIA N/A 10/11/2014   Procedure: ANAL EXAM UNDER ANESTHESIA;  Surgeon: Leighton Ruff, MD;  Location: [place];  Service: General;  Laterality: N/A;   RECTAL EXAM UNDER ANESTHESIA N/A 12/18/2015   Procedure: ANAL EXAM UNDER ANESTHESIA  EXCISION ANAL MARGIN LESION;  Surgeon: Leighton Ruff, MD;  Location: [place];  Service: General;  Laterality: N/A;   RECTAL EXAM UNDER ANESTHESIA N/A 12/07/2019   Procedure: ANAL EXAM UNDER ANESTHESIA;  Surgeon: Leighton Ruff, MD;  Location: [facility];  Service: General;  Laterality: N/A;   ROBOTIC ASSISTED TOTAL HYSTERECTOMY N/A 10/11/2012   Procedure: ROBOTIC ASSISTED TOTAL HYSTERECTOMY;  Surgeon: Lyman Speller, MD;  Location: [facility];  Service: Gynecology;  Laterality: N/A;   SALPINGOOPHORECTOMY Bilateral 10/11/2012   Procedure: SALPINGO OOPHORECTOMY;  Surgeon: Lyman Speller, MD;  Location: [facility];  Service: Gynecology;  Laterality: Bilateral;   TRANSTHORACIC ECHOCARDIOGRAM  05/07/2015   lvsf vigorous, ef 70-75%/  trivial MR and TR   VIDEO ASSISTED THORACOSCOPY (VATS)/THOROCOTOMY Right 11/04/2001   w/ Resection duplication esophageal cyst    There were no vitals filed for this visit.    Subjective Assessment - 05/13/21 1338     Subjective Patient reports that she has been having constant pain in both her hips (L>R). She notes that it has been going on for a few months now and has been getting worse since it began. She has fallen 3 time since her hip pain began. However, she has avoided any major injuries. She has had back pain years ago, but that healed and she has never had anything like this before. She notes that it has gotten as bad as she had to use a walker for multiple days, but then it eased up and she has not needed it since. She is the primary caregiver for her mother. She notes that her mother is mostly independent with her self care, but she may ocassionally be required to help lift her mother up.    Pertinent History COPD, diabetes, depression, history of cancer    Limitations Standing;Walking     How long can you stand comfortably? 20-30 minutes    How long can you walk comfortably? 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 Yes    Pain Score 8     Pain Location Hip    Pain Orientation Right;Left    Pain Descriptors / Indicators Sharp;Pressure    Pain Type Chronic pain    Pain Radiating Towards ocassional radiating pain across her back or into her groin  Pain Onset More than a month ago    Pain Frequency Constant    Aggravating Factors  stairs, prolonged standing and walking    Pain Relieving Factors laying prone    Effect of Pain on Daily Activities limits her ability to complete her job demands, has modified certain activities                [facility] PT Assessment - 05/13/21 0001       Assessment   Medical Diagnosis Bilateral hip pain    Referring Provider (PT) Ninfa Linden, MD    Onset Date/Surgical Date --   Months ago   Next MD Visit 06/05/21    Prior Therapy Yes, for her back      Precautions   Precautions Fall      Restrictions   Weight Bearing Restrictions No      Balance Screen   Has the patient fallen in the past 6 months Yes    How many times? 3    Has the patient had a decrease in activity level because of a fear of falling?  Yes    Is the patient reluctant to leave their home because of a fear of falling?  No      Home Environment   Living Environment Private residence    Living Arrangements Parent    Type of Powers entrance    [place] One level    [place] None      Prior Function   Level of Independence Independent    Vocation Full time employment    Vocation Requirements Respiratory therapist   significant amounts of pushing, pulling, lifting, prolonged standing and walking     Cognition   Overall Cognitive Status Within Functional Limits for tasks assessed    Attention Focused    Focused Attention  Appears intact    Memory Appears intact    Awareness Appears intact    Problem Solving Appears intact      Sensation   Additional Comments Patient has numbness in both feet and hands   Patient has diabetic neuropathy     ROM / Strength   AROM / PROM / Strength Strength;AROM      AROM   Overall AROM Comments all motions were nonpainfu    AROM Assessment Site Lumbar    Lumbar Flexion WFL    Lumbar Extension WFL    Lumbar - Right Side Bend WFL    Lumbar - Left Side Bend WFL    Lumbar - Right Rotation 25% limited    Lumbar - Left Rotation 25% limited      Strength   Strength Assessment Site Hip;Knee;Ankle    Right/Left Hip Right;Left    Right Hip Flexion 4+/5    Left Hip Flexion 4-/5    Left Hip Extension --   limited by pain in her groin   Right/Left Knee Left;Right    Right Knee Flexion 5/5    Right Knee Extension 5/5    Left Knee Flexion 4/5   limited by familiar lateral hip pain   Left Knee Extension 5/5    Right/Left Ankle Right;Left    Right Ankle Dorsiflexion 5/5    Left Ankle Dorsiflexion 5/5      Palpation   Palpation comment No significant tenderness to palpation   prone positioning                       Objective  measurements completed on examination: See above findings.       [place] [facility] Adult PT Treatment/Exercise - 05/13/21 0001       Exercises   Exercises Lumbar      Lumbar Exercises: Stretches   Standing Extension 20 reps;5 seconds    Prone on Elbows Stretch 4 reps;60 seconds                          PT Long Term Goals - 05/13/21 1454       PT LONG TERM GOAL #1   Title Patient will be independent with her HEP.    Time 5    Period Weeks    Status New    Target Date 06/17/21      PT LONG TERM GOAL #2   Title Patient will be able to stand and walk for at least 45 minutes for improved function with her critical job demands.    Time 5    Period Weeks    Status New    Target Date 06/17/21      PT LONG TERM GOAL  #3   Title Patient will be able to safely navigate at least 4 steps for improved function navigating her work environment.    Baseline avoids all steps    Time 5    Period Weeks    Status New    Target Date 06/17/21      PT LONG TERM GOAL #4   Title Patient will report at most 6/10 bilateral hip pain with her daily activities for improved ability to complete these activities.    Baseline 8/10    Time 5    Period Weeks    Status New    Target Date 06/17/21                    Plan - 05/13/21 1440     Clinical Impression Statement Patient is a 50 year old female presenting to physical therapy with bilateral hip pain with no known cause. Lumbar extension was able to significantly reduce her symptoms as she presented with moderate to high pain severity and irritability initially. However, when she was positioned in prone for palpation to the lumbar spine and hips, her pain was completely relieved. Then she exhibited low pain severity and irritability throughout the remainder of her appointment. She was introduced to multiple lumbar extension based interventions for pain management to improve her ability to complete her daily activities and critical job demands. Recommend that she continue with skilled physical therapy to address her impairments to return to her prior level of function.    Personal Factors and Comorbidities Time since onset of injury/illness/exacerbation;Comorbidity 1;Comorbidity 2;Comorbidity 3+    Comorbidities COPD, diabetes, depression, and history of cancer    Examination-Activity Limitations Locomotion Level;Bed Mobility;Carry;Squat;Stairs;Stand;Lift    Examination-Participation Restrictions Occupation;Shop    Stability/Clinical Decision Making Evolving/Moderate complexity    Clinical Decision Making Moderate    Rehab Potential Good    PT Frequency 2x / week    PT Duration Other (comment)   5 weeks   PT Treatment/Interventions Electrical  Stimulation;Cryotherapy;Moist Heat;Traction;Ultrasound;Neuromuscular re-education;Balance training;Therapeutic exercise;Therapeutic activities;Functional mobility training;Stair training;Patient/family education;Manual techniques;Energy conservation;Dry needling;Taping    PT Next Visit Plan extension based interventions, core exercise progression, pelvic tilts, modalities as needed    PT Home Exercise Plan Prone on elbows and standing lumbar extension as needed for pain relief    Consulted and Agree with Plan of Care Patient  Patient will benefit from skilled therapeutic intervention in order to improve the following deficits and impairments:  Decreased range of motion, Decreased activity tolerance, Pain, Decreased balance, Decreased strength, Impaired sensation  Visit Diagnosis: Pain in left hip  Pain in right hip  Difficulty in walking, not elsewhere classified     Problem List Patient Active Problem List   Diagnosis Date Noted   Family history of breast cancer 04/07/2021   AIN grade III 04/07/2021   Chronic obstructive pulmonary disease ([place]) 04/07/2021   Smoking 04/07/2021   Anogenital warts 12/09/2020   Former smoker 03/24/2017   Anxiety and depression 03/24/2017   Chronic pain 03/24/2017   Gastroesophageal reflux disease 12/13/2016   Cough 10/30/2016   Diabetes ([place]) 09/11/2016   Bradycardia 05/07/2015   Intrinsic asthma 11/10/2013   OSA (obstructive sleep apnea) 11/10/2013   History of acute respiratory failure 10/31/2013   History of respiratory failure 10/31/2013   History of pneumonia 10/26/2013   Asthma with acute exacerbation 10/26/2013   Acquired hypothyroidism 10/26/2013   Obesity, unspecified 10/26/2013   PTSD (post-traumatic stress disorder) 01/18/2013   Depression, major, recurrent ([place]) 01/18/2013    Darlin Coco, PT 05/13/2021, 3:11 PM  [facility] [address] [place], [place],  [address] Phone: [phone]   Fax:  [phone]  Name: Denise Ray MRN: [id number] Date of Birth: 1971-01-22

## 2021-05-14 ENCOUNTER — Telehealth: Payer: Self-pay | Admitting: Orthopaedic Surgery

## 2021-05-14 NOTE — Telephone Encounter (Signed)
Pt submitted medical release form, and $25.00 check payment to [employer]. Accepted 05/14/21

## 2021-05-22 ENCOUNTER — Other Ambulatory Visit: Payer: Self-pay

## 2021-05-22 ENCOUNTER — Ambulatory Visit: Payer: 59 | Attending: Orthopaedic Surgery

## 2021-05-22 DIAGNOSIS — R262 Difficulty in walking, not elsewhere classified: Secondary | ICD-10-CM | POA: Insufficient documentation

## 2021-05-22 DIAGNOSIS — M25551 Pain in right hip: Secondary | ICD-10-CM | POA: Insufficient documentation

## 2021-05-22 DIAGNOSIS — M25552 Pain in left hip: Secondary | ICD-10-CM | POA: Diagnosis not present

## 2021-05-22 NOTE — Therapy (Signed)
[facility] [place], [place], [address] Phone: [phone]   Fax:  [phone]  Physical Therapy Treatment  Patient Details  Name: Denise Ray MRN: [id number] Date of Birth: 24-Jul-1970 Referring Provider (PT): Ninfa Linden, MD   Encounter Date: 05/22/2021   PT End of Session - 05/22/21 1124     Visit Number 2    Number of Visits 10    Date for PT Re-Evaluation 08/01/21    PT Start Time 1115    PT Stop Time 0623    PT Time Calculation (min) 49 min    Activity Tolerance Patient tolerated treatment well    Behavior During Therapy [facility] for tasks assessed/performed             Past Medical History:  Diagnosis Date   Anal polyp    Anxiety    Arthritis    Right hip   Chronic pain    followed by pain clinic   Complication of anesthesia    post op urinary retention   COPD with asthma ([place]) followed by pcp and as needed [facility]   hx exacerbation's  --- (11-29-2019  per last exacerbation >2 years;  last used rescue inhaler a week ago)   DOE (dyspnea on exertion)    11-29-2019  per pt going flight of stairs but normal daily activites no sob   Family history of adverse reaction to anesthesia    mother -- ponv ;  per pt in 04/ 2021 father had cardiac arrest and Atrial fib intraoperative for circumcision (this was in [place]) put on venitator for two days then had nuclear stress test per pt showed disease but no cath done, still  has atrial fib;  she also stated prior to this he known cardiac disease with no intervention   GERD (gastroesophageal reflux disease)    Hemorrhoids    History of acute respiratory failure    06/ 2015  and 12/ 2016  CAP and Asthma exacerbation--  vented both times (ARDS)   History of anal dysplasia    AIN 2/ 3     History of cervical dysplasia    CIN 2 in 2000   History of chest pain    11-29-2019 per pt had cardiology evaluation for chest pain (but pt stated has not had any chest  pain or cardiac symtpoms since) referred by pcp  w/ dr Johnsie Cancel note in epic 05-22-2019;  pt had nuclear stress test the showed normal perfusion no ischemia and ef >65% ,  also had echo same day showed normal w/ ef 65%   History of hypothyroidism    History of pneumonia    hx Required ventilatory support   History of TIA (transient ischemic attack)    Caused by medication reaction with combination of wellbutrin and estrogen   Migraines    OSA on CPAP    11-29-2019 pt stated uses every night   Type 2 diabetes mellitus ([place])    followed by pcp  (11-29-2019 pt stated does not check blood sugar)   Wears glasses     Past Surgical History:  Procedure Laterality Date   CESAREAN SECTION  7628   CO2 LASER APPLICATION N/A 07/31/1759   Procedure: CO2 LASER APPLICATION;  Surgeon: Leighton Ruff, MD;  Location: [place];  Service: General;  Laterality: N/A;   CONIZATION OF CERVIX  2000   DILATION AND CURETTAGE OF UTERUS     EVALUATION UNDER ANESTHESIA WITH HEMORRHOIDECTOMY N/A 06/15/2014  Procedure: EXAM UNDER ANESTHESIA WITH HEMORRHOIDECTOMY;  Surgeon: Doreen Salvage, MD;  Location: [place];  Service: General;  Laterality: N/A;   EXCISION OF SKIN TAG N/A 06/15/2014   Procedure: EXCISION OF SKIN TAGS;  Surgeon: Doreen Salvage, MD;  Location: [place];  Service: General;  Laterality: N/A;   HIGH RESOLUTION ANOSCOPY N/A 10/11/2014   Procedure: HIGH RESOLUTION ANOSCOPY WITH BIOPSY;  Surgeon: Leighton Ruff, MD;  Location: [facility];  Service: General;  Laterality: N/A;   HYSTEROSCOPY WITH D & C  09/12/2007  &  2005   MASS EXCISION N/A 12/07/2019   Procedure: EXCISION OF ANAL POLYP AND PERIANAL MOLE;  Surgeon: Leighton Ruff, MD;  Location: [place];  Service: General;  Laterality: N/A;   RECTAL EXAM UNDER ANESTHESIA N/A 10/11/2014   Procedure: ANAL EXAM UNDER ANESTHESIA;  Surgeon: Leighton Ruff, MD;  Location: [place];  Service: General;  Laterality: N/A;   RECTAL EXAM UNDER ANESTHESIA N/A 12/18/2015   Procedure: ANAL EXAM UNDER ANESTHESIA  EXCISION ANAL MARGIN LESION;  Surgeon: Leighton Ruff, MD;  Location: [place];  Service: General;  Laterality: N/A;   RECTAL EXAM UNDER ANESTHESIA N/A 12/07/2019   Procedure: ANAL EXAM UNDER ANESTHESIA;  Surgeon: Leighton Ruff, MD;  Location: [facility];  Service: General;  Laterality: N/A;   ROBOTIC ASSISTED TOTAL HYSTERECTOMY N/A 10/11/2012   Procedure: ROBOTIC ASSISTED TOTAL HYSTERECTOMY;  Surgeon: Lyman Speller, MD;  Location: [facility];  Service: Gynecology;  Laterality: N/A;   SALPINGOOPHORECTOMY Bilateral 10/11/2012   Procedure: SALPINGO OOPHORECTOMY;  Surgeon: Lyman Speller, MD;  Location: [facility];  Service: Gynecology;  Laterality: Bilateral;   TRANSTHORACIC ECHOCARDIOGRAM  05/07/2015   lvsf vigorous, ef 70-75%/  trivial MR and TR   VIDEO ASSISTED THORACOSCOPY (VATS)/THOROCOTOMY Right 11/04/2001   w/ Resection duplication esophageal cyst    There were no vitals filed for this visit.   Subjective Assessment - 05/22/21 1124     Subjective Pt arrives for today's treatment session reporting 5/10 low back and B hip pain.  Pt states that her pain is constant.    Pertinent History COPD, diabetes, depression, history of cancer    Limitations Standing;Walking    How long can you stand comfortably? 20-30 minutes    How long can you walk comfortably? 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 Yes    Pain Score 5     Pain Location Back    Pain Orientation Right;Lower    Pain Onset More than a month ago                               [facility] Adult PT Treatment/Exercise - 05/22/21 0001       Exercises   Exercises Lumbar      Lumbar Exercises: Stretches   Prone on Elbows Stretch 5 reps;60  seconds    Press Ups 10 reps   short hold at top   Other Lumbar Stretch Exercise Prone lying with MH x 10 mins   1 pillow     Lumbar Exercises: Prone   Other Prone Lumbar Exercises Ham curls B 20 reps      Modalities   Modalities Electrical Stimulation;Moist Heat      Moist Heat Therapy  Number Minutes Moist Heat 15 Minutes    Moist Heat Location Lumbar Spine      Electrical Stimulation   Electrical Stimulation Location Lumbar spine    Electrical Stimulation Action IFC 80-150 Hz    Electrical Stimulation Parameters 40% scan x 15 mins    Electrical Stimulation Goals Pain                          PT Long Term Goals - 05/13/21 1454       PT LONG TERM GOAL #1   Title Patient will be independent with her HEP.    Time 5    Period Weeks    Status New    Target Date 06/17/21      PT LONG TERM GOAL #2   Title Patient will be able to stand and walk for at least 45 minutes for improved function with her critical job demands.    Time 5    Period Weeks    Status New    Target Date 06/17/21      PT LONG TERM GOAL #3   Title Patient will be able to safely navigate at least 4 steps for improved function navigating her work environment.    Baseline avoids all steps    Time 5    Period Weeks    Status New    Target Date 06/17/21      PT LONG TERM GOAL #4   Title Patient will report at most 6/10 bilateral hip pain with her daily activities for improved ability to complete these activities.    Baseline 8/10    Time 5    Period Weeks    Status New    Target Date 06/17/21                   Plan - 05/22/21 1125     Clinical Impression Statement Pt arrives for today's treatment session reporting 5/10 low back and B hip pain.  Pt attempted Nustep for warmup, but was uanble to get both feet on the pedals without increase in pain.  Pt instructed in various prone exercises and stretches to increase strength and decrease pain.  Pt without pain when lying in  prone.  Estim performed to low back to decrease pain and tone.  Normal responses to estim noted.  Pt may benefit from prone traction in future treatments.  Pt reported 3/10 low back pain at completion of today's treatment session.    Personal Factors and Comorbidities Time since onset of injury/illness/exacerbation;Comorbidity 1;Comorbidity 2;Comorbidity 3+    Comorbidities COPD, diabetes, depression, and history of cancer    Examination-Activity Limitations Locomotion Level;Bed Mobility;Carry;Squat;Stairs;Stand;Lift    Examination-Participation Restrictions Occupation;Shop    Stability/Clinical Decision Making Evolving/Moderate complexity    Rehab Potential Good    PT Frequency 2x / week    PT Duration Other (comment)   5 weeks   PT Treatment/Interventions Electrical Stimulation;Cryotherapy;Moist Heat;Traction;Ultrasound;Neuromuscular re-education;Balance training;Therapeutic exercise;Therapeutic activities;Functional mobility training;Stair training;Patient/family education;Manual techniques;Energy conservation;Dry needling;Taping    PT Next Visit Plan extension based interventions, core exercise progression, pelvic tilts, modalities as needed    PT Home Exercise Plan Prone on elbows and standing lumbar extension as needed for pain relief    Consulted and Agree with Plan of Care Patient             Patient will benefit from skilled therapeutic intervention in order to improve the following deficits and impairments:  Decreased range of  motion, Decreased activity tolerance, Pain, Decreased balance, Decreased strength, Impaired sensation  Visit Diagnosis: Pain in left hip  Pain in right hip  Difficulty in walking, not elsewhere classified     Problem List Patient Active Problem List   Diagnosis Date Noted   Family history of breast cancer 04/07/2021   AIN grade III 04/07/2021   Chronic obstructive pulmonary disease ([place]) 04/07/2021   Smoking 04/07/2021   Anogenital warts  12/09/2020   Former smoker 03/24/2017   Anxiety and depression 03/24/2017   Chronic pain 03/24/2017   Gastroesophageal reflux disease 12/13/2016   Cough 10/30/2016   Diabetes ([place]) 09/11/2016   Bradycardia 05/07/2015   Intrinsic asthma 11/10/2013   OSA (obstructive sleep apnea) 11/10/2013   History of acute respiratory failure 10/31/2013   History of respiratory failure 10/31/2013   History of pneumonia 10/26/2013   Asthma with acute exacerbation 10/26/2013   Acquired hypothyroidism 10/26/2013   Obesity, unspecified 10/26/2013   PTSD (post-traumatic stress disorder) 01/18/2013   Depression, major, recurrent ([place]) 01/18/2013    Kathrynn Ducking, PTA 05/22/2021, 12:07 PM  [facility] [address] [place], [place], [address] Phone: [phone]   Fax:  [phone]  Name: Denise Ray MRN: [id number] Date of Birth: 03-17-1971

## 2021-05-23 ENCOUNTER — Telehealth: Payer: Self-pay | Admitting: Orthopaedic Surgery

## 2021-05-23 NOTE — Telephone Encounter (Signed)
Pt is asking for Renato Gails to call pt concerning some forms the doctor filled out was in correct. Please call pt about this matter at [phone].

## 2021-05-23 NOTE — Telephone Encounter (Signed)
Holding for [employer]

## 2021-05-26 ENCOUNTER — Encounter: Payer: Self-pay | Admitting: Physical Therapy

## 2021-05-26 ENCOUNTER — Ambulatory Visit: Payer: 59 | Admitting: Physical Therapy

## 2021-05-26 ENCOUNTER — Other Ambulatory Visit: Payer: Self-pay

## 2021-05-26 DIAGNOSIS — M25551 Pain in right hip: Secondary | ICD-10-CM | POA: Diagnosis not present

## 2021-05-26 DIAGNOSIS — R262 Difficulty in walking, not elsewhere classified: Secondary | ICD-10-CM

## 2021-05-26 DIAGNOSIS — M25552 Pain in left hip: Secondary | ICD-10-CM

## 2021-05-26 NOTE — Patient Instructions (Signed)

## 2021-05-26 NOTE — Therapy (Signed)
[facility] [place], [place], [address] Phone: [phone]   Fax:  [phone]  Physical Therapy Treatment  Patient Details  Name: Denise Ray MRN: [id number] Date of Birth: 01-Jan-1971 Referring Provider (PT): Ninfa Linden, MD   Encounter Date: 05/26/2021   PT End of Session - 05/26/21 1119     Visit Number 3    Number of Visits 10    Date for PT Re-Evaluation 08/01/21    PT Start Time 1119    PT Stop Time 1200    PT Time Calculation (min) 41 min    Activity Tolerance Patient tolerated treatment well    Behavior During Therapy [facility] for tasks assessed/performed             Past Medical History:  Diagnosis Date   Anal polyp    Anxiety    Arthritis    Right hip   Chronic pain    followed by pain clinic   Complication of anesthesia    post op urinary retention   COPD with asthma ([place]) followed by pcp and as needed  pulmonary   hx exacerbation's  --- (11-29-2019  per last exacerbation >2 years;  last used rescue inhaler a week ago)   DOE (dyspnea on exertion)    11-29-2019  per pt going flight of stairs but normal daily activites no sob   Family history of adverse reaction to anesthesia    mother -- ponv ;  per pt in 04/ 2021 father had cardiac arrest and Atrial fib intraoperative for circumcision (this was in [place]) put on venitator for two days then had nuclear stress test per pt showed disease but no cath done, still  has atrial fib;  she also stated prior to this he known cardiac disease with no intervention   GERD (gastroesophageal reflux disease)    Hemorrhoids    History of acute respiratory failure    06/ 2015  and 12/ 2016  CAP and Asthma exacerbation--  vented both times (ARDS)   History of anal dysplasia    AIN 2/ 3     History of cervical dysplasia    CIN 2 in 2000   History of chest pain    11-29-2019 per pt had cardiology evaluation for chest pain (but pt stated has not had any chest  pain or cardiac symtpoms since) referred by pcp  w/ dr Johnsie Cancel note in epic 05-22-2019;  pt had nuclear stress test the showed normal perfusion no ischemia and ef >65% ,  also had echo same day showed normal w/ ef 65%   History of hypothyroidism    History of pneumonia    hx Required ventilatory support   History of TIA (transient ischemic attack)    Caused by medication reaction with combination of wellbutrin and estrogen   Migraines    OSA on CPAP    11-29-2019 pt stated uses every night   Type 2 diabetes mellitus ([place])    followed by pcp  (11-29-2019 pt stated does not check blood sugar)   Wears glasses     Past Surgical History:  Procedure Laterality Date   CESAREAN SECTION  5643   CO2 LASER APPLICATION N/A 08/14/5186   Procedure: CO2 LASER APPLICATION;  Surgeon: Leighton Ruff, MD;  Location: [place];  Service: General;  Laterality: N/A;   CONIZATION OF CERVIX  2000   DILATION AND CURETTAGE OF UTERUS     EVALUATION UNDER ANESTHESIA WITH HEMORRHOIDECTOMY N/A 06/15/2014  Procedure: EXAM UNDER ANESTHESIA WITH HEMORRHOIDECTOMY;  Surgeon: Doreen Salvage, MD;  Location: [place];  Service: General;  Laterality: N/A;   EXCISION OF SKIN TAG N/A 06/15/2014   Procedure: EXCISION OF SKIN TAGS;  Surgeon: Doreen Salvage, MD;  Location: [place];  Service: General;  Laterality: N/A;   HIGH RESOLUTION ANOSCOPY N/A 10/11/2014   Procedure: HIGH RESOLUTION ANOSCOPY WITH BIOPSY;  Surgeon: Leighton Ruff, MD;  [facility];  Service: General;  Laterality: N/A;   HYSTEROSCOPY WITH D & C  09/12/2007  &  2005   MASS EXCISION N/A 12/07/2019   Procedure: EXCISION OF ANAL POLYP AND PERIANAL MOLE;  Surgeon: Leighton Ruff, MD;  Location: [place];  Service: General;  Laterality: N/A;   RECTAL EXAM UNDER ANESTHESIA N/A 10/11/2014   Procedure: ANAL EXAM UNDER ANESTHESIA;  Surgeon: Leighton Ruff, MD;  Location: [place];  Service: General;  Laterality: N/A;   RECTAL EXAM UNDER ANESTHESIA N/A 12/18/2015   Procedure: ANAL EXAM UNDER ANESTHESIA  EXCISION ANAL MARGIN LESION;  Surgeon: Leighton Ruff, MD;  Location: [place];  Service: General;  Laterality: N/A;   RECTAL EXAM UNDER ANESTHESIA N/A 12/07/2019   Procedure: ANAL EXAM UNDER ANESTHESIA;  Surgeon: Leighton Ruff, MD;  Location: [facility];  Service: General;  Laterality: N/A;   ROBOTIC ASSISTED TOTAL HYSTERECTOMY N/A 10/11/2012   Procedure: ROBOTIC ASSISTED TOTAL HYSTERECTOMY;  Surgeon: [place] Speller, MD;  Location: [facility];  Service: Gynecology;  Laterality: N/A;   SALPINGOOPHORECTOMY Bilateral 10/11/2012   Procedure: SALPINGO OOPHORECTOMY;  Surgeon: [place] Speller, MD;  Location: [facility];  Service: Gynecology;  Laterality: Bilateral;   TRANSTHORACIC ECHOCARDIOGRAM  05/07/2015   lvsf vigorous, ef 70-75%/  trivial MR and TR   VIDEO ASSISTED THORACOSCOPY (VATS)/THOROCOTOMY Right 11/04/2001   w/ Resection duplication esophageal cyst    There were no vitals filed for this visit.   Subjective Assessment - 05/26/21 1117     Subjective Reports that pain did not come back after last treatment until one hour post treatment. Has no LBP or R hip pain but all pain is in L hip. Spent most of the last few days in her car. States that with [facility] she feels as if L hip will pop out.    Pertinent History COPD, diabetes, depression, history of cancer    Limitations Standing;Walking    How long can you stand comfortably? 20-30 minutes    How long can you walk comfortably? 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 Yes    Pain Score 5     Pain Location Hip    Pain Orientation Left    Pain Descriptors / Indicators Constant;Pressure    Pain Type Chronic pain    Pain Onset More than a month ago    Pain Frequency  Constant                [facility] PT Assessment - 05/26/21 0001       Assessment   Medical Diagnosis Bilateral hip pain    Referring Provider (PT) Ninfa Linden, MD    Next MD Visit 06/09/2021    Prior Therapy Yes, for her back      Precautions   Precautions Fall      Restrictions   Weight Bearing Restrictions No  Observation/Other Assessments   Observations L hip ER even in prone due to pain                           [facility] Adult PT Treatment/Exercise - 05/26/21 0001       Lumbar Exercises: Standing   Other Standing Lumbar Exercises Core press x10 reps 5 sec    Other Standing Lumbar Exercises Unable to complete glute sets      Lumbar Exercises: Supine   Bridge 20 reps;3 seconds      Lumbar Exercises: Prone   Straight Leg Raise 20 reps    Other Prone Lumbar Exercises Ham curls B 20 reps; B hip rotations x20 reps    Other Prone Lumbar Exercises POE x2 min, prone pressups x15 reps      Modalities   Modalities Electrical Stimulation;Moist Heat   in prone     Moist Heat Therapy   Number Minutes Moist Heat 15 Minutes    Moist Heat Location Lumbar Spine      Electrical Stimulation   Electrical Stimulation Location lumbar/ L hip    Electrical Stimulation Action IFC    Electrical Stimulation Parameters 80-150 hz x15 min    Electrical Stimulation Goals Pain                     PT Education - 05/26/21 1147     Education Details Postural/ADLs handout    Person(s) Educated Patient    Methods Explanation;Handout    Comprehension Verbalized understanding                 PT Long Term Goals - 05/26/21 1158       PT LONG TERM GOAL #1   Title Patient will be independent with her HEP.    Time 5    Period Weeks    Status On-going    Target Date 06/17/21      PT LONG TERM GOAL #2   Title Patient will be able to stand and walk for at least 45 minutes for improved function with her critical job demands.    Time 5    Period Weeks     Status On-going    Target Date 06/17/21      PT LONG TERM GOAL #3   Title Patient will be able to safely navigate at least 4 steps for improved function navigating her work environment.    Baseline avoids all steps    Time 5    Period Weeks    Status On-going    Target Date 06/17/21      PT LONG TERM GOAL #4   Title Patient will report at most 6/10 bilateral hip pain with her daily activities for improved ability to complete these activities.    Baseline 8/10    Time 5    Period Weeks    Status On-going    Target Date 06/17/21                   Plan - 05/26/21 1153     Clinical Impression Statement Patient presented in clinic with reports of more L hip pain today. Patient sleeps more in prone due to relief of pain. Patient can only tolerate minimal time in supine position. Patient had more pain in L hip even with core press in standing and unable to do glute squeeze in standing due to pain. More pain also in prone with L hip IR  but no R hip pain with rotation. No pain with supine bridging. Patient also provided a postural and ADLs handout to assist with posture and technique while at home. Also consulted in possibly waiting until after MRI results are known to get traction. Normal modalties response noted following removal of the modalities.    Personal Factors and Comorbidities Time since onset of injury/illness/exacerbation;Comorbidity 1;Comorbidity 2;Comorbidity 3+    Comorbidities COPD, diabetes, depression, and history of cancer    Examination-Activity Limitations Locomotion Level;Bed Mobility;Carry;Squat;Stairs;Stand;Lift    Examination-Participation Restrictions Occupation;Shop    Stability/Clinical Decision Making Evolving/Moderate complexity    Rehab Potential Good    PT Frequency 2x / week    PT Duration Other (comment)    PT Treatment/Interventions Electrical Stimulation;Cryotherapy;Moist Heat;Traction;Ultrasound;Neuromuscular re-education;Balance  training;Therapeutic exercise;Therapeutic activities;Functional mobility training;Stair training;Patient/family education;Manual techniques;Energy conservation;Dry needling;Taping    PT Next Visit Plan extension based interventions, core exercise progression, pelvic tilts, modalities as needed    PT Home Exercise Plan Prone on elbows and standing lumbar extension as needed for pain relief    Consulted and Agree with Plan of Care Patient             Patient will benefit from skilled therapeutic intervention in order to improve the following deficits and impairments:  Decreased range of motion, Decreased activity tolerance, Pain, Decreased balance, Decreased strength, Impaired sensation  Visit Diagnosis: Pain in left hip  Pain in right hip  Difficulty in walking, not elsewhere classified     Problem List Patient Active Problem List   Diagnosis Date Noted   Family history of breast cancer 04/07/2021   AIN grade III 04/07/2021   Chronic obstructive pulmonary disease ([place]) 04/07/2021   Smoking 04/07/2021   Anogenital warts 12/09/2020   Former smoker 03/24/2017   Anxiety and depression 03/24/2017   Chronic pain 03/24/2017   Gastroesophageal reflux disease 12/13/2016   Cough 10/30/2016   Diabetes ([place]) 09/11/2016   Bradycardia 05/07/2015   Intrinsic asthma 11/10/2013   OSA (obstructive sleep apnea) 11/10/2013   History of acute respiratory failure 10/31/2013   History of respiratory failure 10/31/2013   History of pneumonia 10/26/2013   Asthma with acute exacerbation 10/26/2013   Acquired hypothyroidism 10/26/2013   Obesity, unspecified 10/26/2013   PTSD (post-traumatic stress disorder) 01/18/2013   Depression, major, recurrent ([place]) 01/18/2013    Standley Brooking, PTA 05/26/2021, 12:05 PM  [facility] [address] [place], [place], [address] Phone: [phone]   Fax:  [phone]  Name: Denise Ray MRN: [id number] Date  of Birth: 04-Apr-1971

## 2021-05-26 NOTE — Telephone Encounter (Signed)
IC, spoke with patient. The duration & frequency missing on first [employer] form. I filled in the duration & frequency for 2 episodes a month-lasting 2 days and checked yes for chronic condition, 2nd matrix form-#3 added referred for P.T. and MRI, #4 chronic condition checked. Patient was agreeable.

## 2021-05-30 ENCOUNTER — Ambulatory Visit
Admission: RE | Admit: 2021-05-30 | Discharge: 2021-05-30 | Disposition: A | Discharge status: Home / Self Care | Payer: 59 | Source: Ambulatory Visit | Attending: Orthopaedic Surgery | Admitting: Orthopaedic Surgery

## 2021-05-30 ENCOUNTER — Ambulatory Visit: Payer: 59

## 2021-05-30 ENCOUNTER — Other Ambulatory Visit: Payer: Self-pay

## 2021-05-30 DIAGNOSIS — M1611 Unilateral primary osteoarthritis, right hip: Secondary | ICD-10-CM | POA: Diagnosis not present

## 2021-05-30 DIAGNOSIS — M25551 Pain in right hip: Secondary | ICD-10-CM

## 2021-05-30 DIAGNOSIS — M25552 Pain in left hip: Secondary | ICD-10-CM | POA: Diagnosis not present

## 2021-05-30 DIAGNOSIS — R262 Difficulty in walking, not elsewhere classified: Secondary | ICD-10-CM

## 2021-05-30 DIAGNOSIS — M5127 Other intervertebral disc displacement, lumbosacral region: Secondary | ICD-10-CM | POA: Diagnosis not present

## 2021-05-30 DIAGNOSIS — M48061 Spinal stenosis, lumbar region without neurogenic claudication: Secondary | ICD-10-CM | POA: Diagnosis not present

## 2021-05-30 DIAGNOSIS — R600 Localized edema: Secondary | ICD-10-CM | POA: Diagnosis not present

## 2021-05-30 DIAGNOSIS — M79604 Pain in right leg: Secondary | ICD-10-CM

## 2021-05-30 IMAGING — MR MR HIP*R* W/O CM
7 series · 37 of 40 positions shown · non-contrast
Comparison: X-ray hip [DATE].

CLINICAL DATA: Chronic bilateral hip pain with weakness for 6
months. Clinical concern for impingement/labral tear.

EXAM:
MR OF THE RIGHT HIP WITHOUT CONTRAST
TECHNIQUE: Multiplanar, multisequence MR imaging was performed. No intravenous
contrast was administered.

[Series 9: T2 fat-sat · coronal · right · 3.0mm · 0.89mm/px · 5 of 31 slices shown (1 of 2)]
[im 1/31]
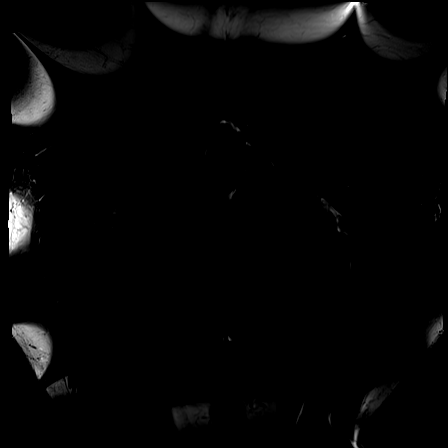
[im 8/31]
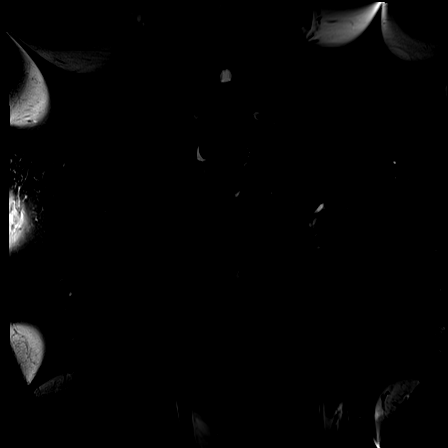
[im 16/31]
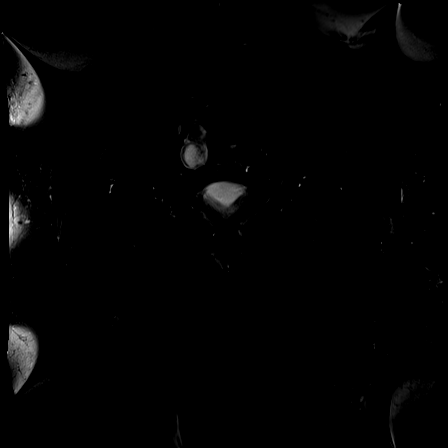
[im 23/31]
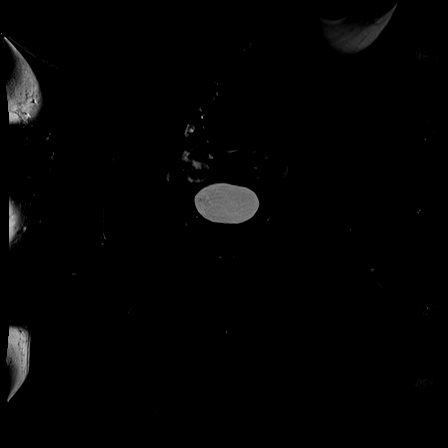
[im 31/31]
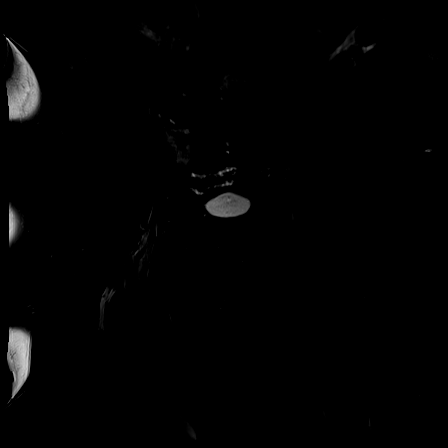

[Series 10: T1 · coronal · right · 3.0mm · 0.89mm/px · 5 of 31 slices shown]
[im 1/31]
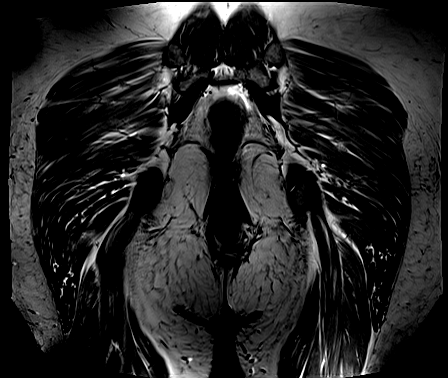
[im 8/31]
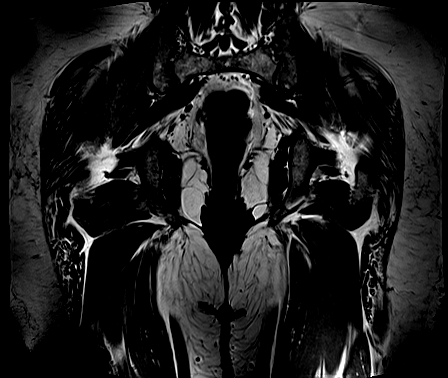
[im 16/31]
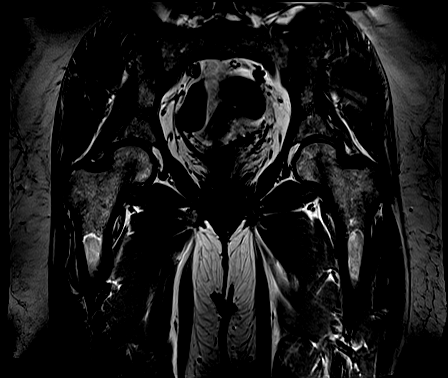
[im 23/31]
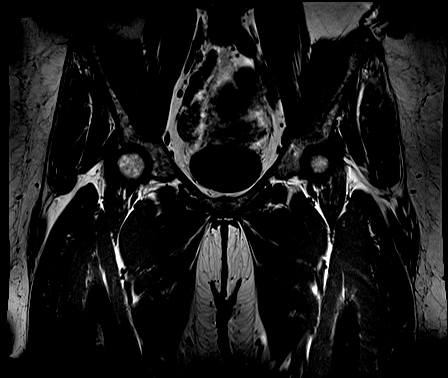
[im 31/31]
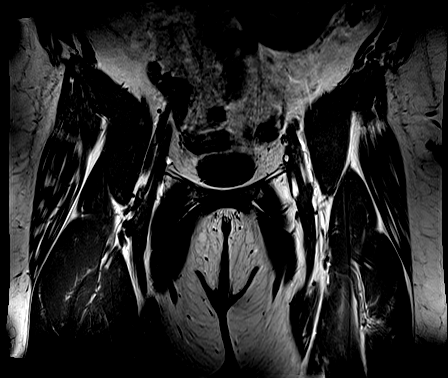

[Series 11: T2 fat-sat · axial · right · 3.0mm · 1.19mm/px · z∈[-8,+96]mm · 6 of 30 slices shown (2 of 2)]
[im 1/30]
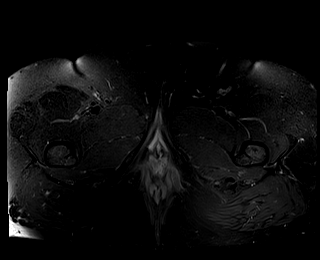
[im 6/30]
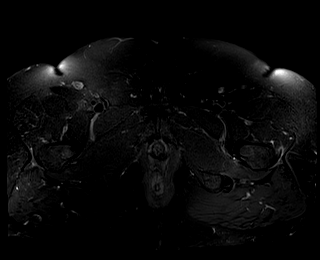
[im 12/30]
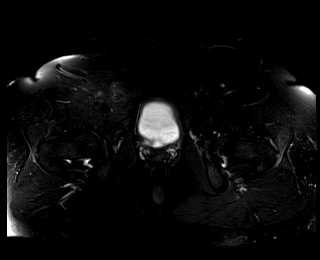
[im 18/30]
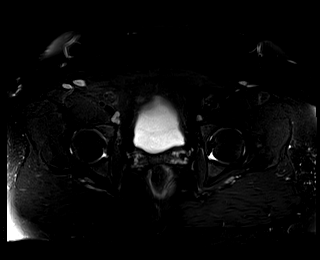
[im 24/30]
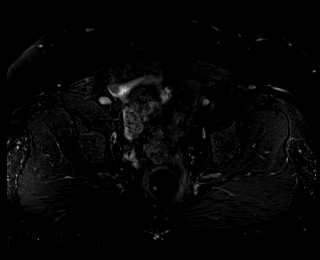
[im 30/30]
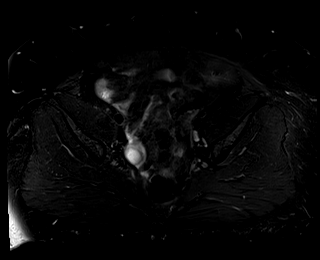

[Series 12: PD fat-sat · coronal · right · 3.0mm · 0.56mm/px · 5 of 28 slices shown (1 of 3)]
[im 1/28]
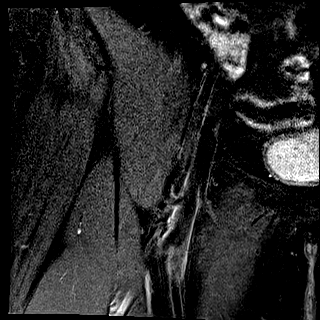
[im 7/28]
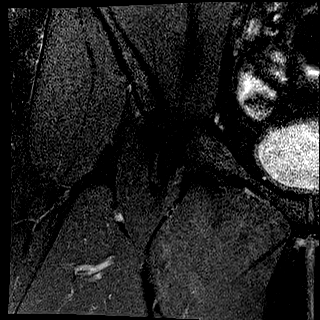
[im 14/28]
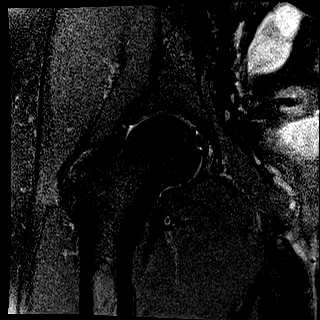
[im 21/28]
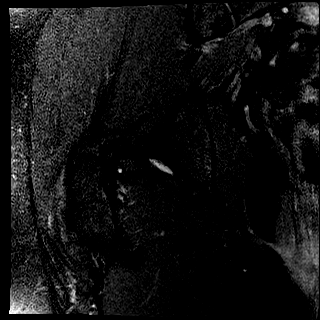
[im 28/28]
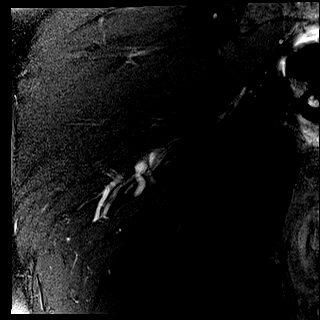

[Series 13: PD fat-sat · sagittal · right · 3.0mm · 0.56mm/px · 7 of 35 slices shown (2 of 3)]
[im 1/35]
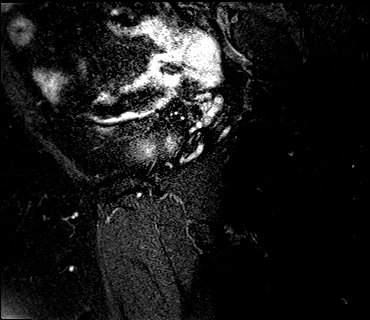
[im 6/35]
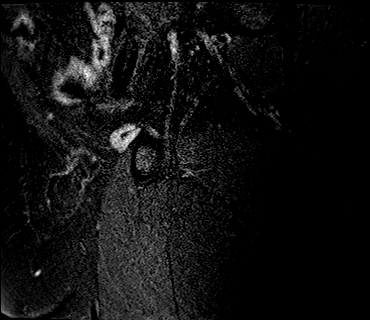
[im 12/35]
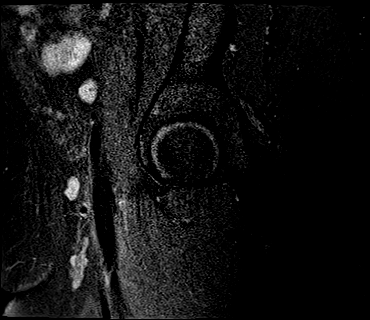
[im 18/35]
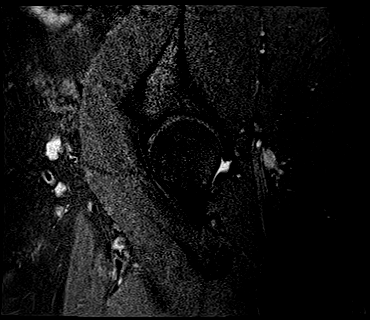
[im 23/35]
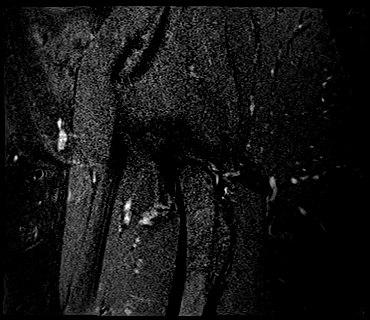
[im 29/35]
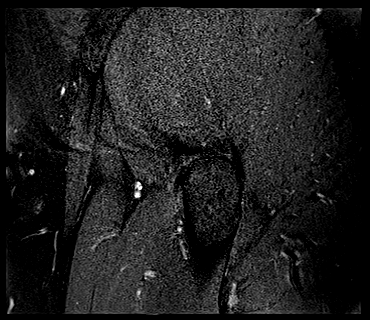
[im 35/35]
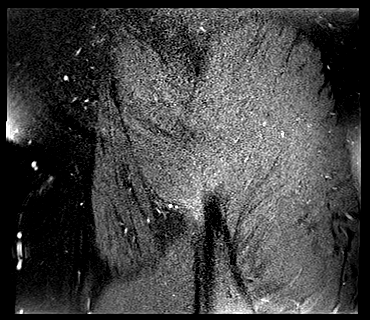

[Series 14: STIR fat-sat · coronal · right · 3.0mm · 1.25mm/px · 2 of 25 slices shown]
[im 1/25]
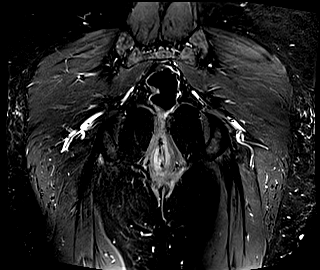
[im 7/25]
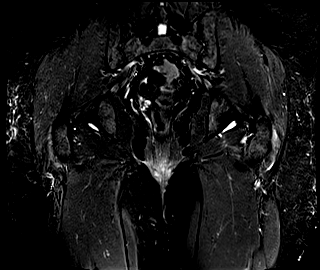

[Series 15: PD fat-sat · sagittal · right · 3.0mm · 0.56mm/px · 7 of 35 slices shown (3 of 3)]
[im 1/35]
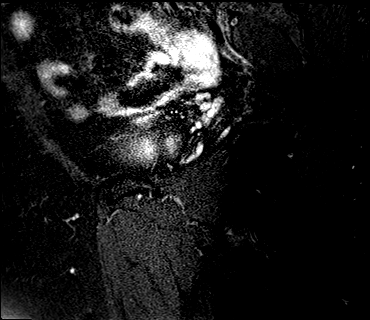
[im 6/35]
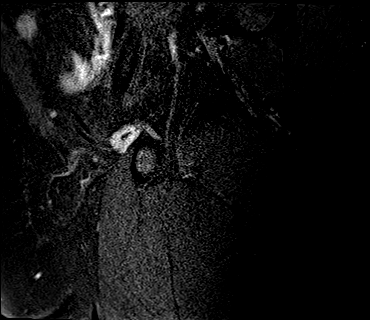
[im 12/35]
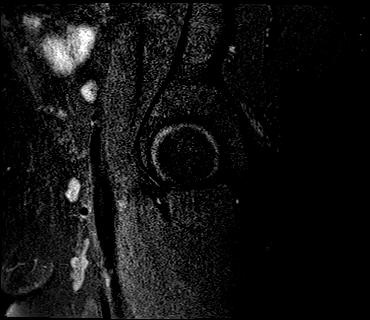
[im 18/35]
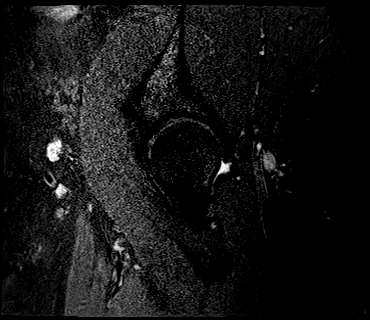
[im 23/35]
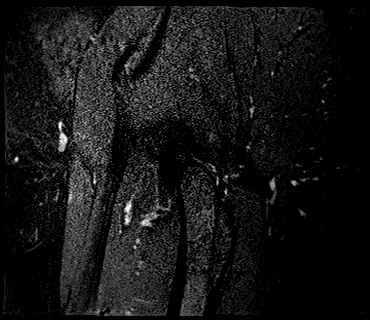
[im 29/35]
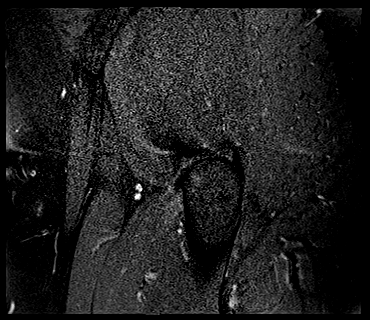
[im 35/35]
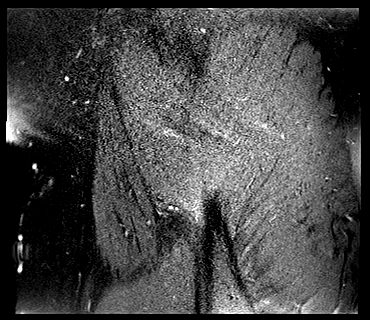

[37 of 40 positions shown; findings below may reference images not displayed]

FINDINGS: Bones: No acute fracture. No dislocation. No femoral head avascular
necrosis. Bilateral acetabular over-coverage, more pronounced on the
right. Bony pelvis intact without diastasis. SI joints and pubic
symphysis within normal limits. No bone marrow edema. No marrow
replacing bone lesion.

Articular cartilage and labrum

Articular cartilage: Cartilage thinning along the superior aspect of
the femoral head and acetabulum. No subchondral marrow signal
changes.

Labrum: Superior labral degeneration with intrasubstance cystic
change. No paralabral cyst.

Joint or bursal effusion

Joint effusion:  None.

Bursae: Left greater than right peritrochanteric bursal edema
without focal fluid collection.

Muscles and tendons

Muscles and tendons: Mild tendinosis of the bilateral gluteus medius
and minimus tendons, most pronounced involving the left gluteus
minimus tendon. The hamstring, iliopsoas, rectus femoris, and
adductor tendons appear intact without tear or significant
tendinosis. Normal muscle bulk and signal intensity without edema,
atrophy, or fatty infiltration.

Other findings

Miscellaneous: No soft tissue edema or fluid collection. No inguinal
lymphadenopathy. Two simple appearing right adnexal cysts, the
largest of which measures up to 2.4 cm.
IMPRESSION: 1. Mild degenerative changes of the right hip. Superior labral
degeneration with intrasubstance cystic change.
2. Mild bilateral gluteus medius and minimus tendinosis, left worse
than right. Left greater than right peritrochanteric bursal edema.
3. Osseous morphology of the bilateral hips which can be seen in the
clinical setting of femoroacetabular impingement.
4. Incidentally noted small right adnexal cysts. No follow-up
imaging recommended. Note: This recommendation does not apply to
premenarchal patients and to those with increased risk (genetic,
family history, elevated tumor markers or other high-risk factors)
of ovarian cancer. Reference: JACR [DATE]):[id number]

## 2021-05-30 IMAGING — MR MR LUMBAR SPINE W/O CM
4 of 5 series · 18 of 48 positions shown · non-contrast
Comparison: None.

CLINICAL DATA: Low back pain, bilateral hip pain, weakness for 6
months, no known injury

EXAM:
MRI LUMBAR SPINE WITHOUT CONTRAST
TECHNIQUE: Multiplanar, multisequence MR imaging of the lumbar spine was
performed. No intravenous contrast was administered.

[Series 6: T2 · sagittal · 4.0mm · 0.73mm/px · 5 of 15 slices shown (1 of 2)]
[im 1/15]
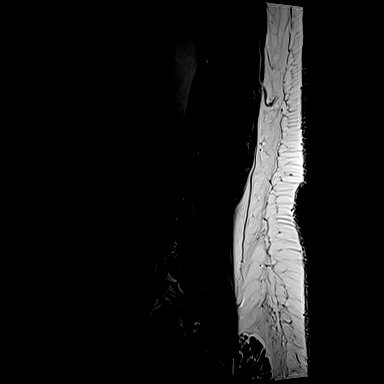
[im 4/15]
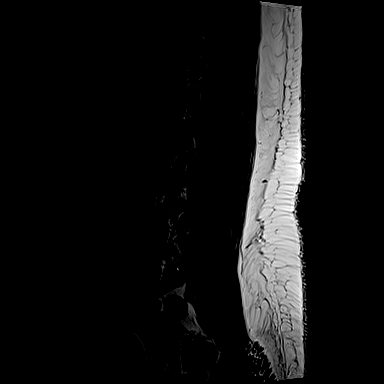
[im 8/15]
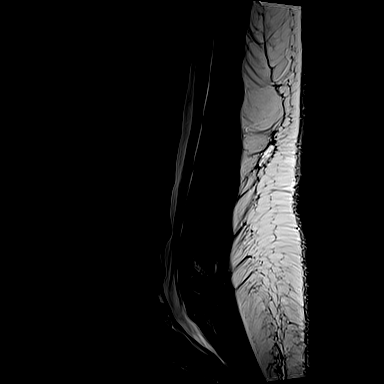
[im 11/15]
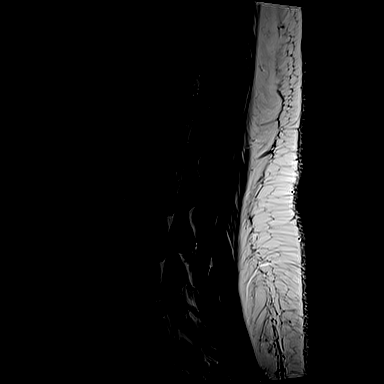
[im 15/15]
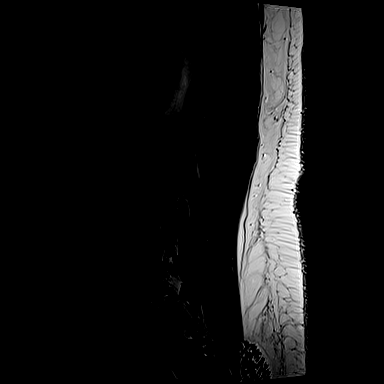

[Series 7: T1 · sagittal · 4.0mm · 0.73mm/px · 3 of 15 slices shown (1 of 2)]
[im 1/15]
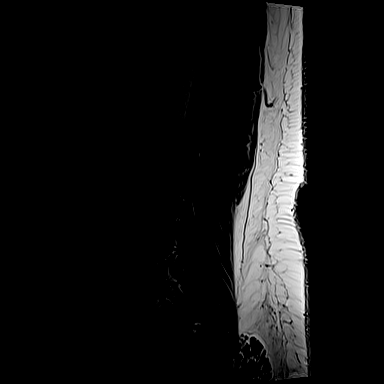
[im 8/15]
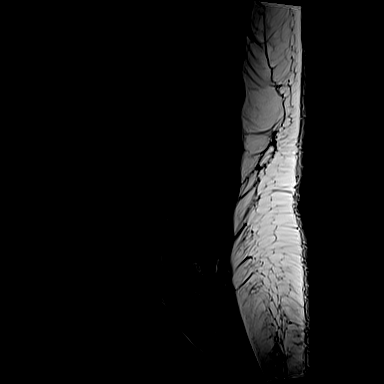
[im 15/15]
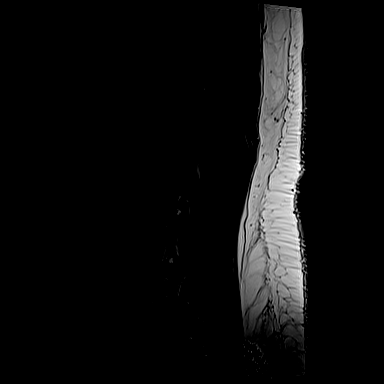

[Series 11: T1 · axial · 4.0mm · 0.28mm/px · z∈[+19,+177]mm · 3 of 42 slices shown (2 of 2)]
[im 6/42]
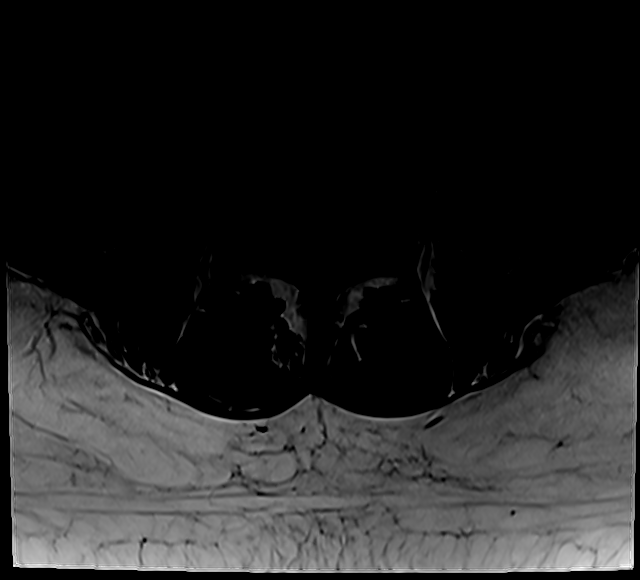
[im 22/42]
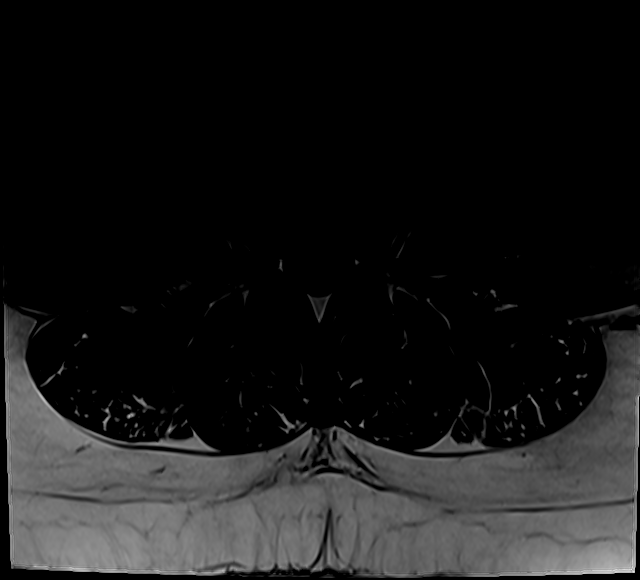
[im 36/42]
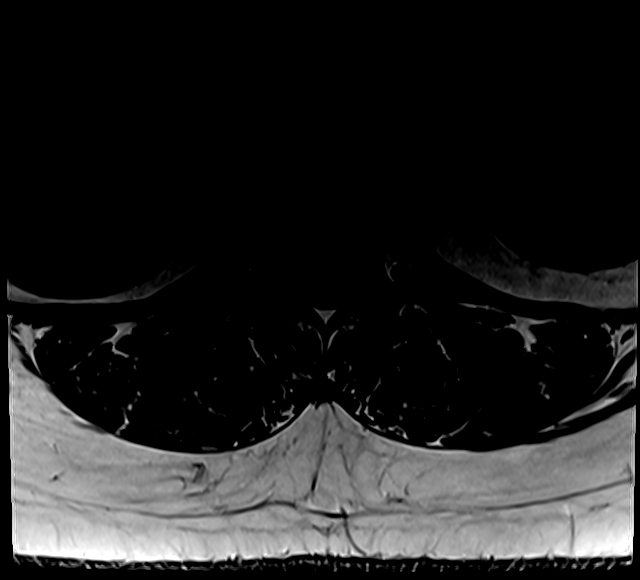

[Series 14: T2 · axial · 4.0mm · 0.28mm/px · z∈[+4,+177]mm · 7 of 42 slices shown (2 of 2)]
[im 3/42]
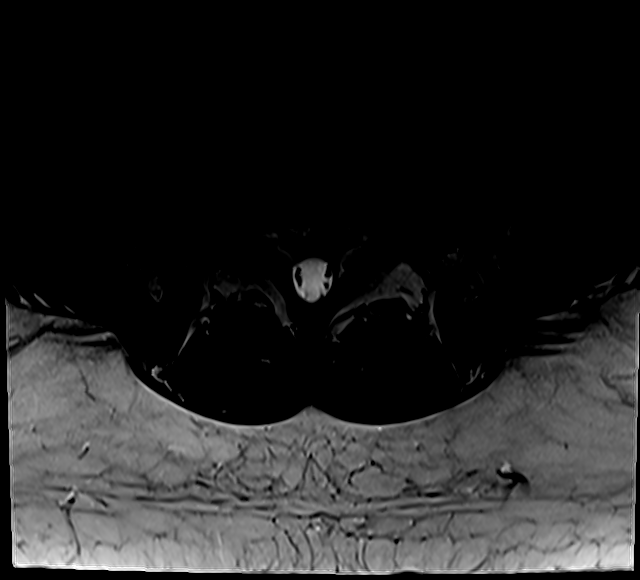
[im 6/42]
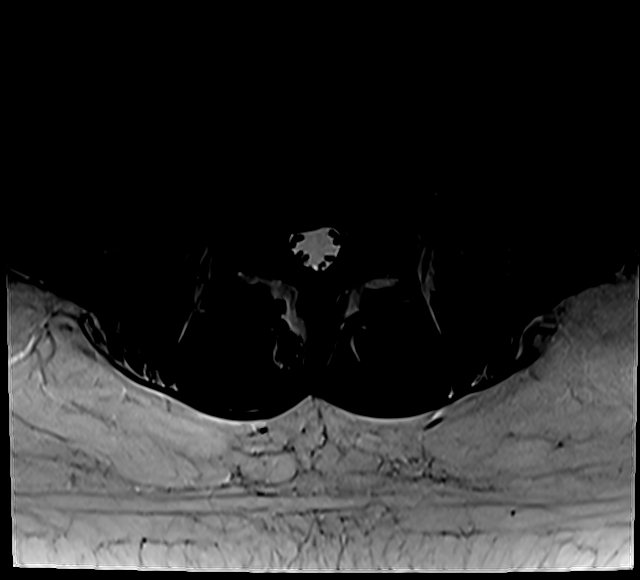
[im 9/42]
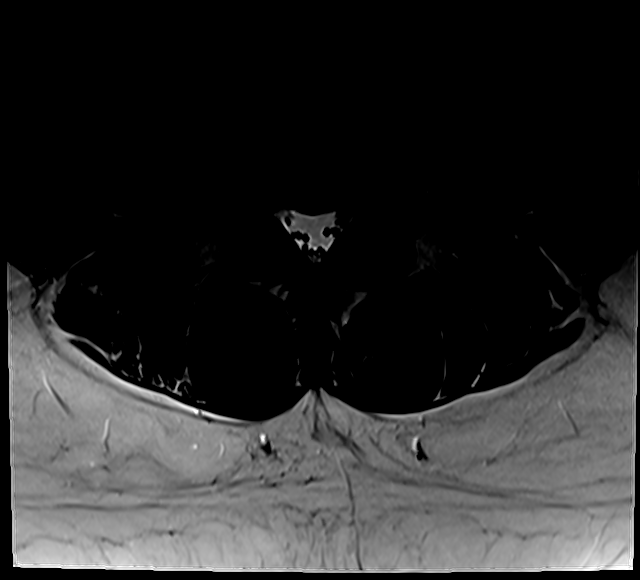
[im 14/42]
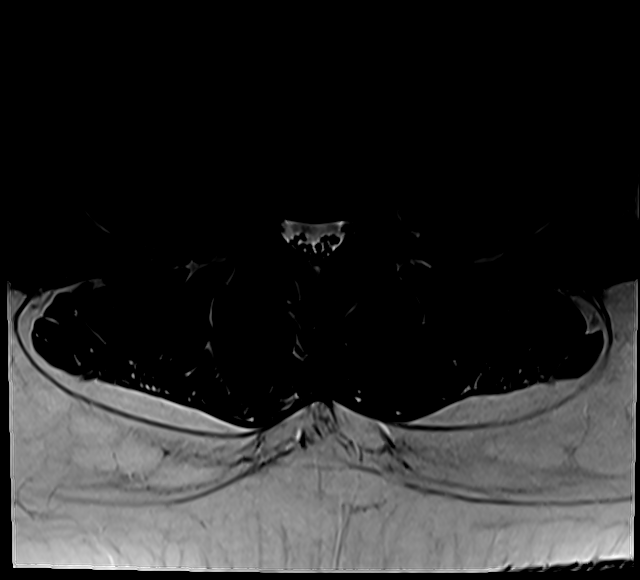
[im 20/42]
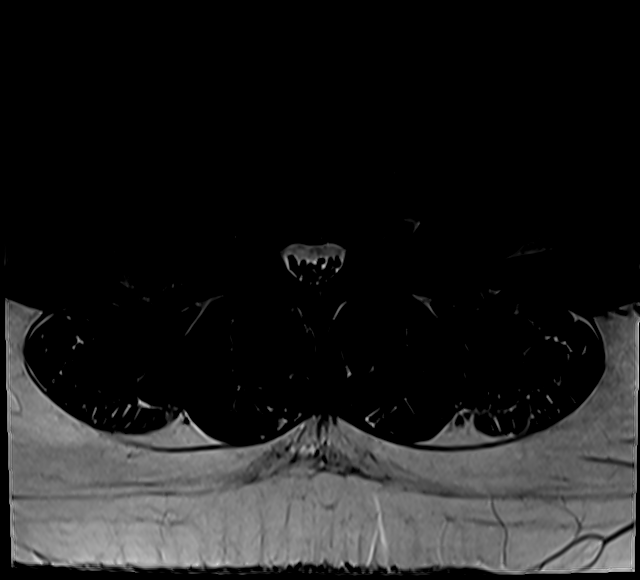
[im 22/42]
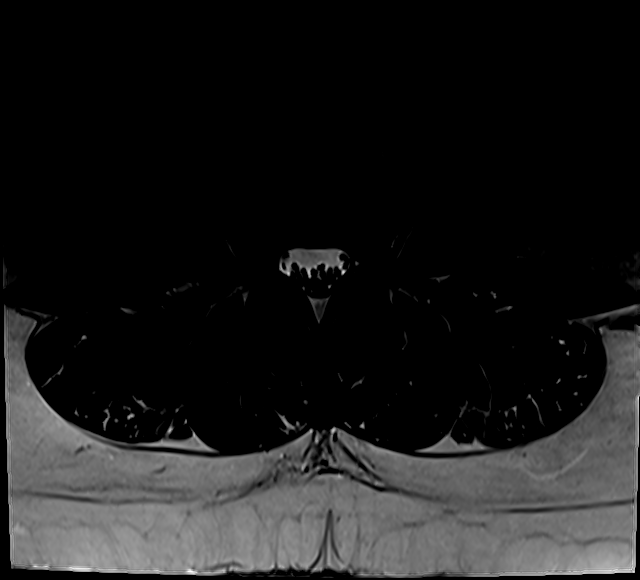
[im 36/42]
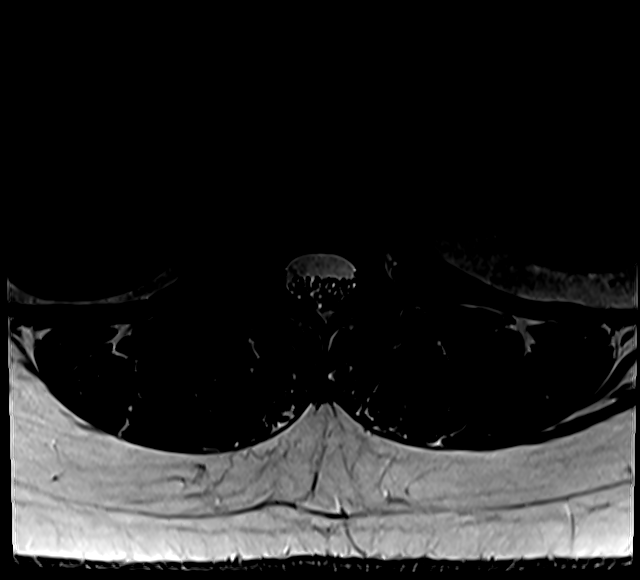

[18 of 48 positions shown; findings below may reference images not displayed]

FINDINGS: Segmentation:  Standard.

Alignment:  Minimal retrolisthesis of L3 on L4 and L4 on L5.

Vertebrae: No acute fracture, evidence of discitis, or aggressive
bone lesion.

Conus medullaris and cauda equina: Conus extends to the T12 level.
Conus and cauda equina appear normal.

Paraspinal and other soft tissues: No acute paraspinal abnormality.

Disc levels:

Disc spaces: Degenerative disease with disc desiccation at L3-4 and
L4-5. Disc desiccation and mild disc height loss at L5-S1.

T12-L1: No significant disc bulge. No neural foraminal stenosis. No
central canal stenosis.

L1-L2: No significant disc bulge. No neural foraminal stenosis. No
central canal stenosis.

L2-L3: No significant disc bulge. No neural foraminal stenosis. No
central canal stenosis.

L3-L4: Mild broad-based disc bulge with a small right paracentral
disc protrusion. Bilateral lateral recess stenosis, right worse than
left. Mild spinal stenosis. No foraminal stenosis.

L4-L5: Broad-based disc bulge with a tiny central disc protrusion.
Mild bilateral facet arthropathy. No foraminal or central canal
stenosis.

L5-S1: Broad-based disc bulge with a broad right
paracentral/foraminal disc protrusion with right subarticular recess
stenosis. Mild bilateral foraminal stenosis.
IMPRESSION: 1. At L3-4 there is a mild broad-based disc bulge with a small right
paracentral disc protrusion. Bilateral lateral recess stenosis,
right worse than left. Mild spinal stenosis.
2. At L4-5 there is a broad-based disc bulge with a tiny central
disc protrusion. Mild bilateral facet arthropathy.
3. At L5-S1 there is a broad-based disc bulge with a broad right
paracentral/foraminal disc protrusion with right subarticular recess
stenosis. Mild bilateral foraminal stenosis.

## 2021-05-30 NOTE — Therapy (Signed)
[facility] [place], [place], [address] Phone: [phone]   Fax:  [phone]  Physical Therapy Treatment  Patient Details  Name: Denise Ray MRN: [id number] Date of Birth: 06-01-1970 Referring Provider (PT): Ninfa Linden, MD   Encounter Date: 05/30/2021   PT End of Session - 05/30/21 0811     Visit Number 4    Number of Visits 10    Date for PT Re-Evaluation 08/01/21    PT Start Time 0815    PT Stop Time 0909    PT Time Calculation (min) 54 min    Activity Tolerance Patient tolerated treatment well    Behavior During Therapy [facility] for tasks assessed/performed             Past Medical History:  Diagnosis Date   Anal polyp    Anxiety    Arthritis    Right hip   Chronic pain    followed by pain clinic   Complication of anesthesia    post op urinary retention   COPD with asthma ([place]) followed by pcp and as needed [place] [facility]   hx exacerbation's  --- (11-29-2019  per last exacerbation >2 years;  last used rescue inhaler a week ago)   DOE (dyspnea on exertion)    11-29-2019  per pt going flight of stairs but normal daily activites no sob   Family history of adverse reaction to anesthesia    mother -- ponv ;  per pt in 04/ 2021 father had cardiac arrest and Atrial fib intraoperative for circumcision (this was in [place]) put on venitator for two days then had nuclear stress test per pt showed disease but no cath done, still  has atrial fib;  she also stated prior to this he known cardiac disease with no intervention   GERD (gastroesophageal reflux disease)    Hemorrhoids    History of acute respiratory failure    06/ 2015  and 12/ 2016  CAP and Asthma exacerbation--  vented both times (ARDS)   History of anal dysplasia    AIN 2/ 3     History of cervical dysplasia    CIN 2 in 2000   History of chest pain    11-29-2019 per pt had cardiology evaluation for chest pain (but pt stated has not had any chest  pain or cardiac symtpoms since) referred by pcp  w/ dr Johnsie Cancel note in epic 05-22-2019;  pt had nuclear stress test the showed normal perfusion no ischemia and ef >65% ,  also had echo same day showed normal w/ ef 65%   History of hypothyroidism    History of pneumonia    hx Required ventilatory support   History of TIA (transient ischemic attack)    Caused by medication reaction with combination of wellbutrin and estrogen   Migraines    OSA on CPAP    11-29-2019 pt stated uses every night   Type 2 diabetes mellitus ([place])    followed by pcp  (11-29-2019 pt stated does not check blood sugar)   Wears glasses     Past Surgical History:  Procedure Laterality Date   CESAREAN SECTION  7124   CO2 LASER APPLICATION N/A 5/80/9983   Procedure: CO2 LASER APPLICATION;  Surgeon: Leighton Ruff, MD;  Location: [place];  Service: General;  Laterality: N/A;   CONIZATION OF CERVIX  2000   DILATION AND CURETTAGE OF UTERUS     EVALUATION UNDER ANESTHESIA WITH HEMORRHOIDECTOMY N/A 06/15/2014  Procedure: EXAM UNDER ANESTHESIA WITH HEMORRHOIDECTOMY;  Surgeon: Doreen Salvage, MD;  Location: [place];  Service: General;  Laterality: N/A;   EXCISION OF SKIN TAG N/A 06/15/2014   Procedure: EXCISION OF SKIN TAGS;  Surgeon: Doreen Salvage, MD;  Location: [place];  Service: General;  Laterality: N/A;   HIGH RESOLUTION ANOSCOPY N/A 10/11/2014   Procedure: HIGH RESOLUTION ANOSCOPY WITH BIOPSY;  Surgeon: Leighton Ruff, MD;  Location: [facility];  Service: General;  Laterality: N/A;   HYSTEROSCOPY WITH D & C  09/12/2007  &  2005   MASS EXCISION N/A 12/07/2019   Procedure: EXCISION OF ANAL POLYP AND PERIANAL MOLE;  Surgeon: Leighton Ruff, MD;  Location: [place];  Service: General;  Laterality: N/A;   RECTAL EXAM UNDER ANESTHESIA N/A 10/11/2014   Procedure: ANAL EXAM UNDER ANESTHESIA;  Surgeon: Leighton Ruff, MD;  Location: [place];  Service: General;  Laterality: N/A;   RECTAL EXAM UNDER ANESTHESIA N/A 12/18/2015   Procedure: ANAL EXAM UNDER ANESTHESIA  EXCISION ANAL MARGIN LESION;  Surgeon: Leighton Ruff, MD;  Location: [place];  Service: General;  Laterality: N/A;   RECTAL EXAM UNDER ANESTHESIA N/A 12/07/2019   Procedure: ANAL EXAM UNDER ANESTHESIA;  Surgeon: Leighton Ruff, MD;  Location: [facility];  Service: General;  Laterality: N/A;   ROBOTIC ASSISTED TOTAL HYSTERECTOMY N/A 10/11/2012   Procedure: ROBOTIC ASSISTED TOTAL HYSTERECTOMY;  Surgeon: Lyman Speller, MD;  Location: [facility];  Service: Gynecology;  Laterality: N/A;   SALPINGOOPHORECTOMY Bilateral 10/11/2012   Procedure: SALPINGO OOPHORECTOMY;  Surgeon: Lyman Speller, MD;  Location: [facility];  Service: Gynecology;  Laterality: Bilateral;   TRANSTHORACIC ECHOCARDIOGRAM  05/07/2015   lvsf vigorous, ef 70-75%/  trivial MR and TR   VIDEO ASSISTED THORACOSCOPY (VATS)/THOROCOTOMY Right 11/04/2001   w/ Resection duplication esophageal cyst    There were no vitals filed for this visit.   Subjective Assessment - 05/30/21 0816     Subjective Patient reports that her hips feels worse today. She notes that she had to work three 12 hour shifts this week which really increased her pain. She notes that her HEP has not been helping her pain. She notes that she has an MRI scheduled for later today.    Pertinent History COPD, diabetes, depression, history of cancer    Limitations Standing;Walking    How long can you stand comfortably? 20-30 minutes    How long can you walk comfortably? 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 Yes    Pain Score 7     Pain Location Hip    Pain Orientation Right;Left    Pain Onset More than a month ago                               [facility] Adult PT  Treatment/Exercise - 05/30/21 0001       Modalities   Modalities Electrical Stimulation;Moist Heat   in prone     Moist Heat Therapy   Number Minutes Moist Heat 20 Minutes    Moist Heat Location Lumbar Spine      Electrical Stimulation   Electrical Stimulation Location low lumbar bilaterally    Electrical Stimulation Action IFC    Electrical Stimulation Parameters 80-150 Hz x 40%  scan x 20 minutes    Electrical Stimulation Goals Pain      Manual Therapy   Manual Therapy Joint mobilization;Soft tissue mobilization    Joint Mobilization L3-5 grade I-IV CPA's    Soft tissue mobilization bilateral gluteals and piriformis                          PT Long Term Goals - 05/26/21 1158       PT LONG TERM GOAL #1   Title Patient will be independent with her HEP.    Time 5    Period Weeks    Status On-going    Target Date 06/17/21      PT LONG TERM GOAL #2   Title Patient will be able to stand and walk for at least 45 minutes for improved function with her critical job demands.    Time 5    Period Weeks    Status On-going    Target Date 06/17/21      PT LONG TERM GOAL #3   Title Patient will be able to safely navigate at least 4 steps for improved function navigating her work environment.    Baseline avoids all steps    Time 5    Period Weeks    Status On-going    Target Date 06/17/21      PT LONG TERM GOAL #4   Title Patient will report at most 6/10 bilateral hip pain with her daily activities for improved ability to complete these activities.    Baseline 8/10    Time 5    Period Weeks    Status On-going    Target Date 06/17/21                   Plan - 05/30/21 3710     Clinical Impression Statement Patient presented to treatment reporting increased bilateral hip pain after working three twelve hour shifts. Treatment focused on manual therapy to both hips with soft tissue mobilization to the piriformis and gluteals being the most effective.  This was followed by electrical stimulation to further reduce her familiar symptoms with moderate effectiveness. She reported feeling better upon the conclusion of treatment. She continues to require skilled physical therapy to address her remaining impairments to return to her prior level of function.    Personal Factors and Comorbidities Time since onset of injury/illness/exacerbation;Comorbidity 1;Comorbidity 2;Comorbidity 3+    Comorbidities COPD, diabetes, depression, and history of cancer    Examination-Activity Limitations Locomotion Level;Bed Mobility;Carry;Squat;Stairs;Stand;Lift    Examination-Participation Restrictions Occupation;Shop    Stability/Clinical Decision Making Evolving/Moderate complexity    Rehab Potential Good    PT Frequency 2x / week    PT Duration Other (comment)    PT Treatment/Interventions Electrical Stimulation;Cryotherapy;Moist Heat;Traction;Ultrasound;Neuromuscular re-education;Balance training;Therapeutic exercise;Therapeutic activities;Functional mobility training;Stair training;Patient/family education;Manual techniques;Energy conservation;Dry needling;Taping    PT Next Visit Plan extension based interventions, core exercise progression, pelvic tilts, modalities as needed    PT Home Exercise Plan Prone on elbows and standing lumbar extension as needed for pain relief    Consulted and Agree with Plan of Care Patient             Patient will benefit from skilled therapeutic intervention in order to improve the following deficits and impairments:  Decreased range of motion, Decreased activity tolerance, Pain, Decreased balance, Decreased strength, Impaired sensation  Visit Diagnosis: Pain in left hip  Pain in right hip  Difficulty in walking, not elsewhere classified  Problem List Patient Active Problem List   Diagnosis Date Noted   Family history of breast cancer 04/07/2021   AIN grade III 04/07/2021   Chronic obstructive pulmonary disease  ([place]) 04/07/2021   Smoking 04/07/2021   Anogenital warts 12/09/2020   Former smoker 03/24/2017   Anxiety and depression 03/24/2017   Chronic pain 03/24/2017   Gastroesophageal reflux disease 12/13/2016   Cough 10/30/2016   Diabetes ([place]) 09/11/2016   Bradycardia 05/07/2015   Intrinsic asthma 11/10/2013   OSA (obstructive sleep apnea) 11/10/2013   History of acute respiratory failure 10/31/2013   History of respiratory failure 10/31/2013   History of pneumonia 10/26/2013   Asthma with acute exacerbation 10/26/2013   Acquired hypothyroidism 10/26/2013   Obesity, unspecified 10/26/2013   PTSD (post-traumatic stress disorder) 01/18/2013   Depression, major, recurrent ([place]) 01/18/2013    Darlin Coco, PT 05/30/2021, 9:18 AM  [facility] [place], [place], [address] Phone: [phone]   Fax:  [phone]  Name: Denise Ray MRN: [id number] Date of Birth: 05/23/1970

## 2021-06-02 ENCOUNTER — Other Ambulatory Visit (HOSPITAL_COMMUNITY): Payer: Self-pay

## 2021-06-03 ENCOUNTER — Ambulatory Visit: Payer: 59 | Admitting: Physical Therapy

## 2021-06-03 ENCOUNTER — Other Ambulatory Visit: Payer: Self-pay

## 2021-06-03 ENCOUNTER — Other Ambulatory Visit (HOSPITAL_COMMUNITY): Payer: Self-pay

## 2021-06-03 DIAGNOSIS — Z79899 Other long term (current) drug therapy: Secondary | ICD-10-CM | POA: Diagnosis not present

## 2021-06-03 DIAGNOSIS — E1140 Type 2 diabetes mellitus with diabetic neuropathy, unspecified: Secondary | ICD-10-CM | POA: Diagnosis not present

## 2021-06-03 DIAGNOSIS — E1142 Type 2 diabetes mellitus with diabetic polyneuropathy: Secondary | ICD-10-CM | POA: Diagnosis not present

## 2021-06-03 DIAGNOSIS — G893 Neoplasm related pain (acute) (chronic): Secondary | ICD-10-CM | POA: Diagnosis not present

## 2021-06-03 DIAGNOSIS — M25552 Pain in left hip: Secondary | ICD-10-CM | POA: Diagnosis not present

## 2021-06-03 DIAGNOSIS — E1165 Type 2 diabetes mellitus with hyperglycemia: Secondary | ICD-10-CM | POA: Diagnosis not present

## 2021-06-03 DIAGNOSIS — R262 Difficulty in walking, not elsewhere classified: Secondary | ICD-10-CM | POA: Diagnosis not present

## 2021-06-03 DIAGNOSIS — M25551 Pain in right hip: Secondary | ICD-10-CM | POA: Diagnosis not present

## 2021-06-03 MED ORDER — GABAPENTIN 100 MG PO CAPS
ORAL_CAPSULE | ORAL | 2 refills | Status: DC
  Filled 2021-06-03: qty 240, 30d supply, fill #0
  Filled 2021-07-02: qty 240, 30d supply, fill #1
  Filled 2021-08-01: qty 240, 30d supply, fill #2

## 2021-06-03 MED ORDER — TOPIRAMATE 25 MG PO TABS
ORAL_TABLET | ORAL | 2 refills | Status: DC
  Filled 2021-06-03: qty 60, 30d supply, fill #0
  Filled 2021-07-02: qty 60, 30d supply, fill #1

## 2021-06-03 MED ORDER — HYDROCODONE-ACETAMINOPHEN 10-325 MG PO TABS
1.0000 | ORAL_TABLET | Freq: Four times a day (QID) | ORAL | 0 refills | Status: DC | PRN
  Filled 2021-06-03: qty 120, 30d supply, fill #0

## 2021-06-03 NOTE — Therapy (Addendum)
[facility] [place], [place], [address] Phone: [phone]   Fax:  [phone]  Physical Therapy Treatment  Patient Details  Name: Denise Ray MRN: [id number] Date of Birth: 1970/12/04 Referring Provider (PT): Ninfa Linden, MD   Encounter Date: 06/03/2021   PT End of Session - 06/03/21 1253     Visit Number 5    Number of Visits 10    Date for PT Re-Evaluation 08/01/21    PT Start Time 1118    PT Stop Time 8341    PT Time Calculation (min) 55 min    Activity Tolerance Patient tolerated treatment well    Behavior During Therapy [facility] for tasks assessed/performed             Past Medical History:  Diagnosis Date   Anal polyp    Anxiety    Arthritis    Right hip   Chronic pain    followed by pain clinic   Complication of anesthesia    post op urinary retention   COPD with asthma ([place]) followed by pcp and as needed [place] [facility]   hx exacerbation's  --- (11-29-2019  per last exacerbation >2 years;  last used rescue inhaler a week ago)   DOE (dyspnea on exertion)    11-29-2019  per pt going flight of stairs but normal daily activites no sob   Family history of adverse reaction to anesthesia    mother -- ponv ;  per pt in 04/ 2021 father had cardiac arrest and Atrial fib intraoperative for circumcision (this was in [place]) put on venitator for two days then had nuclear stress test per pt showed disease but no cath done, still  has atrial fib;  she also stated prior to this he known cardiac disease with no intervention   GERD (gastroesophageal reflux disease)    Hemorrhoids    History of acute respiratory failure    06/ 2015  and 12/ 2016  CAP and Asthma exacerbation--  vented both times (ARDS)   History of anal dysplasia    AIN 2/ 3     History of cervical dysplasia    CIN 2 in 2000   History of chest pain    11-29-2019 per pt had cardiology evaluation for chest pain (but pt stated has not had any chest  pain or cardiac symtpoms since) referred by pcp  w/ dr Johnsie Cancel note in epic 05-22-2019;  pt had nuclear stress test the showed normal perfusion no ischemia and ef >65% ,  also had echo same day showed normal w/ ef 65%   History of hypothyroidism    History of pneumonia    hx Required ventilatory support   History of TIA (transient ischemic attack)    Caused by medication reaction with combination of wellbutrin and estrogen   Migraines    OSA on CPAP    11-29-2019 pt stated uses every night   Type 2 diabetes mellitus ([place])    followed by pcp  (11-29-2019 pt stated does not check blood sugar)   Wears glasses     Past Surgical History:  Procedure Laterality Date   CESAREAN SECTION  9622   CO2 LASER APPLICATION N/A 2/97/9892   Procedure: CO2 LASER APPLICATION;  Surgeon: Leighton Ruff, MD;  Location: [place];  Service: General;  Laterality: N/A;   CONIZATION OF CERVIX  2000   DILATION AND CURETTAGE OF UTERUS     EVALUATION UNDER ANESTHESIA WITH HEMORRHOIDECTOMY N/A 06/15/2014  Procedure: EXAM UNDER ANESTHESIA WITH HEMORRHOIDECTOMY;  Surgeon: Doreen Salvage, MD;  Location: [place];  Service: General;  Laterality: N/A;   EXCISION OF SKIN TAG N/A 06/15/2014   Procedure: EXCISION OF SKIN TAGS;  Surgeon: Doreen Salvage, MD;  Location: [place];  Service: General;  Laterality: N/A;   HIGH RESOLUTION ANOSCOPY N/A 10/11/2014   Procedure: HIGH RESOLUTION ANOSCOPY WITH BIOPSY;  Surgeon: Leighton Ruff, MD;  Location: [facility];  Service: General;  Laterality: N/A;   HYSTEROSCOPY WITH D & C  09/12/2007  &  2005   MASS EXCISION N/A 12/07/2019   Procedure: EXCISION OF ANAL POLYP AND PERIANAL MOLE;  Surgeon: Leighton Ruff, MD;  Location: [place];  Service: General;  Laterality: N/A;   RECTAL EXAM UNDER ANESTHESIA N/A 10/11/2014   Procedure: ANAL EXAM UNDER ANESTHESIA;  Surgeon: Leighton Ruff, MD;  Location: [place];  Service: General;  Laterality: N/A;   RECTAL EXAM UNDER ANESTHESIA N/A 12/18/2015   Procedure: ANAL EXAM UNDER ANESTHESIA  EXCISION ANAL MARGIN LESION;  Surgeon: Leighton Ruff, MD;  Location: [place];  Service: General;  Laterality: N/A;   RECTAL EXAM UNDER ANESTHESIA N/A 12/07/2019   Procedure: ANAL EXAM UNDER ANESTHESIA;  Surgeon: Leighton Ruff, MD;  Location: [facility];  Service: General;  Laterality: N/A;   ROBOTIC ASSISTED TOTAL HYSTERECTOMY N/A 10/11/2012   Procedure: ROBOTIC ASSISTED TOTAL HYSTERECTOMY;  Surgeon: Lyman Speller, MD;  Location: [facility];  Service: Gynecology;  Laterality: N/A;   SALPINGOOPHORECTOMY Bilateral 10/11/2012   Procedure: SALPINGO OOPHORECTOMY;  Surgeon: Lyman Speller, MD;  Location: [facility];  Service: Gynecology;  Laterality: Bilateral;   TRANSTHORACIC ECHOCARDIOGRAM  05/07/2015   lvsf vigorous, ef 70-75%/  trivial MR and TR   VIDEO ASSISTED THORACOSCOPY (VATS)/THOROCOTOMY Right 11/04/2001   w/ Resection duplication esophageal cyst    There were no vitals filed for this visit.   Subjective Assessment - 06/03/21 1331     Subjective COVID-19 screen performed prior to patient entering clinic.  CC is left hip pain today.    Pertinent History COPD, diabetes, depression, history of cancer    How long can you stand comfortably? 20-30 minutes    How long can you walk comfortably? 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    Electrical Stimulation Action IFC at 80-150 Hz.    Electrical Stimulation Parameters 40% scan x 20 minutes.    Electrical Stimulation Goals Pain      Ultrasound   Ultrasound Location Left sdly position with folded pillow between knees for comfort:  Combo e'stim/US at 1.50 W/CM2 x 12 minutes to patient's left SIJ region.      Manual Therapy   Soft tissue mobilization STW/M x 12 minutes to patient left low back/SIJ region.                          PT Long Term Goals - 05/26/21 1158       PT LONG TERM GOAL #1   Title Patient will be independent with her HEP.    Time 5    Period Weeks    Status On-going    Target Date 06/17/21      PT LONG TERM GOAL #2   Title Patient will be able to stand and walk for at least 45 minutes for improved function with her critical job demands.    Time 5    Period Weeks    Status On-going    Target Date 06/17/21      PT LONG TERM GOAL #3   Title Patient will be able to safely navigate at least 4 steps for improved function navigating her work environment.    Baseline avoids all steps    Time 5    Period Weeks    Status On-going    Target Date 06/17/21      PT LONG TERM GOAL #4   Title Patient will report at most 6/10 bilateral hip pain with her daily activities for improved ability to complete these activities.    Baseline 8/10    Time 5    Period Weeks    Status On-going    Target Date 06/17/21                   Plan - 06/03/21 1340     Clinical Impression Statement Prior to treatment today the FABER/FADIR test was performed on the left and both test produced significant pain.  We discussed sleeping posture with pillows between knees, no single leg standing and no crossing legs.  Patient was very pleased after treatment and felt better.    Personal Factors and Comorbidities Time since onset of  injury/illness/exacerbation;Comorbidity 1;Comorbidity 2;Comorbidity 3+    Comorbidities COPD, diabetes, depression, and history of cancer    Examination-Activity Limitations Locomotion Level;Bed Mobility;Carry;Squat;Stairs;Stand;Lift    Examination-Participation Restrictions Occupation;Shop    Stability/Clinical Decision Making Evolving/Moderate complexity    Rehab Potential Good    PT Frequency 2x / week    PT Treatment/Interventions Electrical Stimulation;Cryotherapy;Moist Heat;Traction;Ultrasound;Neuromuscular re-education;Balance training;Therapeutic exercise;Therapeutic activities;Functional mobility training;Stair training;Patient/family education;Manual techniques;Energy conservation;Dry needling;Taping    PT Next Visit Plan extension based interventions, core exercise progression, pelvic tilts, modalities as needed    PT Home Exercise Plan Prone on elbows and standing lumbar extension as needed for pain relief    Consulted and Agree with Plan of Care Patient             Patient will benefit from skilled therapeutic intervention in order to improve the following deficits and impairments:  Decreased range of motion, Decreased activity tolerance, Pain, Decreased balance, Decreased strength, Impaired sensation  Visit Diagnosis: Pain in left hip     Problem List Patient Active Problem List   Diagnosis Date Noted   Family history of breast cancer 04/07/2021   AIN grade III 04/07/2021   Chronic obstructive pulmonary disease ([place]) 04/07/2021   Smoking 04/07/2021   Anogenital warts 12/09/2020   Former smoker 03/24/2017   Anxiety and depression 03/24/2017   Chronic pain 03/24/2017  Gastroesophageal reflux disease 12/13/2016   Cough 10/30/2016   Diabetes ([place]) 09/11/2016   Bradycardia 05/07/2015   Intrinsic asthma 11/10/2013   OSA (obstructive sleep apnea) 11/10/2013   History of acute respiratory failure 10/31/2013   History of respiratory failure 10/31/2013   History of  pneumonia 10/26/2013   Asthma with acute exacerbation 10/26/2013   Acquired hypothyroidism 10/26/2013   Obesity, unspecified 10/26/2013   PTSD (post-traumatic stress disorder) 01/18/2013   Depression, major, recurrent ([place]) 01/18/2013    Christia Domke, [place], PT 06/03/2021, 1:45 PM  [facility] [place], [place], [address] Phone: [phone]   Fax:  [phone]  Name: Denise Ray MRN: [id number] Date of Birth: [birth year]  PHYSICAL THERAPY DISCHARGE SUMMARY  Visits from Start of Care: 2.  Current functional level related to goals / functional outcomes: See above.    Remaining deficits: See below.   Education / Equipment:    Patient agrees to discharge. Patient goals were not met. Patient is being discharged due to not returning since the last visit.     Mali Reis Pienta MPT

## 2021-06-04 ENCOUNTER — Other Ambulatory Visit (HOSPITAL_COMMUNITY): Payer: Self-pay

## 2021-06-09 ENCOUNTER — Ambulatory Visit: Payer: 59 | Admitting: Orthopaedic Surgery

## 2021-06-09 ENCOUNTER — Other Ambulatory Visit: Payer: Self-pay

## 2021-06-09 ENCOUNTER — Encounter: Payer: Self-pay | Admitting: Orthopaedic Surgery

## 2021-06-09 DIAGNOSIS — M25552 Pain in left hip: Secondary | ICD-10-CM

## 2021-06-09 DIAGNOSIS — M25551 Pain in right hip: Secondary | ICD-10-CM | POA: Diagnosis not present

## 2021-06-09 NOTE — Progress Notes (Signed)
The patient comes in today with bilateral hip pain with active left worse than right.  We actually x-rayed both of her hips and she is well-maintained joint space.  She is only 51 years old but her pain has been in the groin on both sides.  The first time I saw her it was more significant on the right side so we obtained an MRI of the right hip.  This was not an arthrogram.  It also shows the left hip some.  We also obtained an MRI of her lumbar spine due to some radicular types of pain.  It is still the hips that hurt her worse.  On exam both hips move smoothly and fluidly but she does have pain on extreme rotation of both hips.  MRI of the right hip shows only mild arthritic changes in the hip with no cartilage loss at all.  There are some degenerative changes in the labrum but no labral tear on this MRI this is not an arthrogram.  There is tendinosis of the gluteus medius and minimus tendons but no tear.  There is edema in the trochanteric area of both hips.  The left hip also does not show any severe changes.  The MRI of her lumbar spine does show disc protrusions at L3-L4 and L5-S1 and the one at L5-S1 is more to the right.  She is a diabetic but under good control.  At this point I would like to send her to Dr. Ernestina Patches for bilateral intra-articular hip joint injections with steroid.  Hopefully this can be diagnostic and therapeutic for her.  I will then see her back in 4 weeks.  From a FMLA standpoint, she still needs to continue with her intermittent times of being out of work as she is already and extend this for another 6 months.

## 2021-06-10 ENCOUNTER — Other Ambulatory Visit (HOSPITAL_COMMUNITY): Payer: Self-pay

## 2021-06-12 ENCOUNTER — Ambulatory Visit: Payer: 59 | Admitting: Physical Therapy

## 2021-06-24 ENCOUNTER — Encounter: Payer: Self-pay | Admitting: Physical Medicine and Rehabilitation

## 2021-06-24 ENCOUNTER — Other Ambulatory Visit: Payer: Self-pay

## 2021-06-24 ENCOUNTER — Ambulatory Visit (INDEPENDENT_AMBULATORY_CARE_PROVIDER_SITE_OTHER): Payer: 59 | Admitting: Physical Medicine and Rehabilitation

## 2021-06-24 ENCOUNTER — Ambulatory Visit: Payer: Self-pay

## 2021-06-24 DIAGNOSIS — M25551 Pain in right hip: Secondary | ICD-10-CM | POA: Diagnosis not present

## 2021-06-24 DIAGNOSIS — M25552 Pain in left hip: Secondary | ICD-10-CM

## 2021-06-24 NOTE — Progress Notes (Signed)
° °  Denise Ray - 51 y.o. female MRN [id number]  Date of birth: 04-16-1971  Office Visit Note: Visit Date: 06/24/2021 PCP: Emelia Loron, NP Referred by: Emelia Loron, NP  Subjective: Chief Complaint  Patient presents with   Right Hip - Pain   Left Hip - Pain   HPI:  Denise Ray is a 51 y.o. female who comes in today at the request of Dr. Jean Rosenthal for planned Bilateral anesthetic hip arthrogram with fluoroscopic guidance.  The patient has failed conservative care including home exercise, medications, time and activity modification.  This injection will be diagnostic and hopefully therapeutic.  Please see requesting physician notes for further details and justification.  Patient works as a Statistician.  She is having anterior lateral hip pain some referral to the anterior thigh groin area.  MRI of the hips as well as lumbar spine reviewed.  ROS Otherwise per HPI.  Assessment & Plan: Visit Diagnoses:    ICD-10-CM   1. Pain in left hip  M25.552 Large Joint Inj: bilateral hip joint    XR C-ARM NO REPORT    2. Pain in right hip  M25.551 Large Joint Inj: bilateral hip joint    XR C-ARM NO REPORT      Plan: No additional findings.   Meds & Orders: No orders of the defined types were placed in this encounter.   Orders Placed This Encounter  Procedures   Large Joint Inj: bilateral hip joint   XR C-ARM NO REPORT    Follow-up: Return for visit to requesting provider as needed.   Procedures: Large Joint Inj: bilateral hip joint on 06/24/2021 2:52 PM Indications: diagnostic evaluation and pain Details: 22 G 3.5 in needle, fluoroscopy-guided anterior approach  Arthrogram: No  Medications (Right): 40 mg triamcinolone acetonide 40 MG/ML; 5 mL bupivacaine 0.25 % Medications (Left): 40 mg triamcinolone acetonide 40 MG/ML; 5 mL bupivacaine 0.25 % Outcome: tolerated well, no immediate complications  There was excellent flow of contrast producing a partial  arthrogram of the hip. The patient did NOT seem to have relief of symptoms during the anesthetic phase of the injection. Procedure, treatment alternatives, risks and benefits explained, specific risks discussed. Consent was given by the patient. Immediately prior to procedure a time out was called to verify the correct patient, procedure, equipment, support staff and site/side marked as required. Patient was prepped and draped in the usual sterile fashion.         Clinical History: No specialty comments available.     Objective:  VS:  HT:     WT:    BMI:      BP:    HR: bpm   TEMP: ( )   RESP:  Physical Exam   Imaging: XR C-ARM NO REPORT  Result Date: 06/24/2021 Please see Notes tab for imaging impression.

## 2021-06-24 NOTE — Progress Notes (Signed)
Pt state both hip pain. Pt state walking and standing makes the pain worse. Pt state she takes pain meds to help ease her pain.  Numeric Pain Rating Scale and Functional Assessment Average Pain 6   In the last MONTH (on 0-10 scale) has pain interfered with the following?  1. General activity like being  able to carry out your everyday physical activities such as walking, climbing stairs, carrying groceries, or moving a chair?  Rating(10)   -BT, -Dye Allergies.

## 2021-06-25 MED ORDER — TRIAMCINOLONE ACETONIDE 40 MG/ML IJ SUSP
40.0000 mg | INTRAMUSCULAR | Status: AC | PRN
  Administered 2021-06-24: 40 mg via INTRA_ARTICULAR

## 2021-06-25 MED ORDER — BUPIVACAINE HCL 0.25 % IJ SOLN
5.0000 mL | INTRAMUSCULAR | Status: AC | PRN
  Administered 2021-06-24: 5 mL via INTRA_ARTICULAR

## 2021-07-02 ENCOUNTER — Other Ambulatory Visit (HOSPITAL_COMMUNITY): Payer: Self-pay

## 2021-07-07 ENCOUNTER — Encounter: Payer: Self-pay | Admitting: Orthopaedic Surgery

## 2021-07-07 ENCOUNTER — Ambulatory Visit (INDEPENDENT_AMBULATORY_CARE_PROVIDER_SITE_OTHER): Payer: 59 | Admitting: Orthopaedic Surgery

## 2021-07-07 ENCOUNTER — Other Ambulatory Visit: Payer: Self-pay

## 2021-07-07 DIAGNOSIS — E1140 Type 2 diabetes mellitus with diabetic neuropathy, unspecified: Secondary | ICD-10-CM | POA: Diagnosis not present

## 2021-07-07 DIAGNOSIS — E1165 Type 2 diabetes mellitus with hyperglycemia: Secondary | ICD-10-CM | POA: Diagnosis not present

## 2021-07-07 DIAGNOSIS — E1142 Type 2 diabetes mellitus with diabetic polyneuropathy: Secondary | ICD-10-CM | POA: Diagnosis not present

## 2021-07-07 DIAGNOSIS — G893 Neoplasm related pain (acute) (chronic): Secondary | ICD-10-CM | POA: Diagnosis not present

## 2021-07-07 DIAGNOSIS — M25552 Pain in left hip: Secondary | ICD-10-CM | POA: Diagnosis not present

## 2021-07-07 DIAGNOSIS — M25551 Pain in right hip: Secondary | ICD-10-CM | POA: Diagnosis not present

## 2021-07-07 NOTE — Progress Notes (Signed)
The patient is seen in follow-up after Dr. Ernestina Patches place steroid injections under fluoroscopy in both of her hip joints.  She had had MRIs of her right hip that showed some mild arthritic changes.  Both her hips were significantly painful.  He did these injections on February 7.  She is 51 years old.  She says the injections have helped quite a bit and she is doing much better overall.  She still having some low back pain but it is something she said she can deal with.  Both hips move smoothly and fluidly and seem to have no blocks to rotation and no pain in the groin with rotation of either hip.  At this point I recommended weight loss in terms of her back as well as core strengthening exercises.  Follow-up can be as needed since she is doing well.  She knows to wait at least 6 months between any type of injections in her hip joint.

## 2021-07-08 ENCOUNTER — Other Ambulatory Visit (HOSPITAL_COMMUNITY): Payer: Self-pay

## 2021-07-08 MED ORDER — HYDROCODONE-ACETAMINOPHEN 10-325 MG PO TABS
1.0000 | ORAL_TABLET | Freq: Four times a day (QID) | ORAL | 0 refills | Status: DC | PRN
  Filled 2021-07-08: qty 120, 30d supply, fill #0

## 2021-07-08 MED ORDER — ALPRAZOLAM 1 MG PO TABS
ORAL_TABLET | ORAL | 2 refills | Status: DC
  Filled 2021-07-08: qty 60, 30d supply, fill #0
  Filled 2021-08-01 – 2021-08-02 (×2): qty 60, 30d supply, fill #1
  Filled 2021-08-29: qty 60, 30d supply, fill #2

## 2021-07-14 ENCOUNTER — Other Ambulatory Visit (HOSPITAL_COMMUNITY): Payer: Self-pay | Admitting: Nurse Practitioner

## 2021-07-14 DIAGNOSIS — Z1231 Encounter for screening mammogram for malignant neoplasm of breast: Secondary | ICD-10-CM

## 2021-08-01 ENCOUNTER — Other Ambulatory Visit (HOSPITAL_COMMUNITY): Payer: Self-pay

## 2021-08-01 MED ORDER — OZEMPIC (0.25 OR 0.5 MG/DOSE) 2 MG/3ML ~~LOC~~ SOPN
PEN_INJECTOR | SUBCUTANEOUS | 1 refills | Status: DC
Start: 2021-03-04 — End: 2022-06-09
  Filled 2021-08-01: qty 3, 28d supply, fill #0

## 2021-08-02 ENCOUNTER — Other Ambulatory Visit (HOSPITAL_COMMUNITY): Payer: Self-pay

## 2021-08-04 ENCOUNTER — Other Ambulatory Visit (HOSPITAL_COMMUNITY): Payer: Self-pay

## 2021-08-04 DIAGNOSIS — E559 Vitamin D deficiency, unspecified: Secondary | ICD-10-CM | POA: Diagnosis not present

## 2021-08-04 DIAGNOSIS — G893 Neoplasm related pain (acute) (chronic): Secondary | ICD-10-CM | POA: Diagnosis not present

## 2021-08-04 DIAGNOSIS — E1140 Type 2 diabetes mellitus with diabetic neuropathy, unspecified: Secondary | ICD-10-CM | POA: Diagnosis not present

## 2021-08-04 DIAGNOSIS — G629 Polyneuropathy, unspecified: Secondary | ICD-10-CM | POA: Diagnosis not present

## 2021-08-04 DIAGNOSIS — E1142 Type 2 diabetes mellitus with diabetic polyneuropathy: Secondary | ICD-10-CM | POA: Diagnosis not present

## 2021-08-04 DIAGNOSIS — E7800 Pure hypercholesterolemia, unspecified: Secondary | ICD-10-CM | POA: Diagnosis not present

## 2021-08-04 DIAGNOSIS — E1165 Type 2 diabetes mellitus with hyperglycemia: Secondary | ICD-10-CM | POA: Diagnosis not present

## 2021-08-04 DIAGNOSIS — M129 Arthropathy, unspecified: Secondary | ICD-10-CM | POA: Diagnosis not present

## 2021-08-05 ENCOUNTER — Other Ambulatory Visit (HOSPITAL_COMMUNITY): Payer: Self-pay

## 2021-08-05 MED ORDER — OZEMPIC (1 MG/DOSE) 4 MG/3ML ~~LOC~~ SOPN
PEN_INJECTOR | SUBCUTANEOUS | 2 refills | Status: DC
  Filled 2021-08-05: qty 12, 84d supply, fill #0

## 2021-08-05 MED ORDER — HYDROCODONE-ACETAMINOPHEN 10-325 MG PO TABS
1.0000 | ORAL_TABLET | Freq: Four times a day (QID) | ORAL | 0 refills | Status: DC | PRN
  Filled 2021-08-05: qty 120, 30d supply, fill #0

## 2021-08-06 ENCOUNTER — Ambulatory Visit (HOSPITAL_COMMUNITY)
Admission: RE | Admit: 2021-08-06 | Discharge: 2021-08-06 | Disposition: A | Discharge status: Home / Self Care | Payer: 59 | Source: Ambulatory Visit | Attending: Nurse Practitioner | Admitting: Nurse Practitioner

## 2021-08-06 ENCOUNTER — Other Ambulatory Visit: Payer: Self-pay

## 2021-08-06 DIAGNOSIS — Z1231 Encounter for screening mammogram for malignant neoplasm of breast: Secondary | ICD-10-CM | POA: Insufficient documentation

## 2021-08-06 IMAGING — MG MM DIGITAL SCREENING BILAT W/ TOMO AND CAD
8 series · 8 of 24 positions shown · non-contrast
Comparison: Previous exam(s).

CLINICAL DATA: Screening.

EXAM:
DIGITAL SCREENING BILATERAL MAMMOGRAM WITH TOMOSYNTHESIS AND CAD
TECHNIQUE: Bilateral screening digital craniocaudal and mediolateral oblique
mammograms were obtained. Bilateral screening digital breast
tomosynthesis was performed. The images were evaluated with
computer-aided detection.

[R MLO synth-2D]
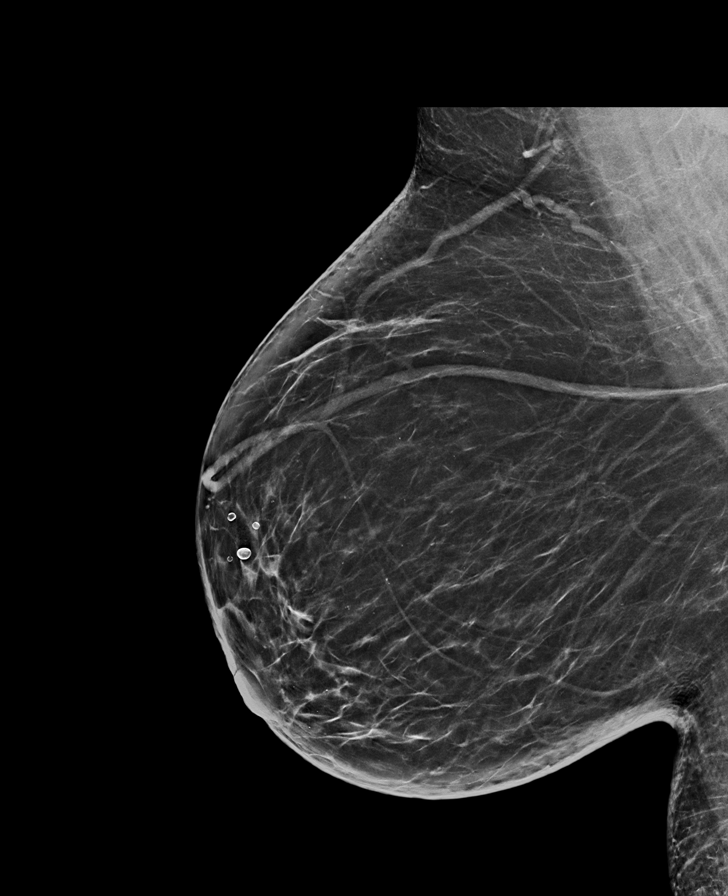

[L MLO synth-2D]
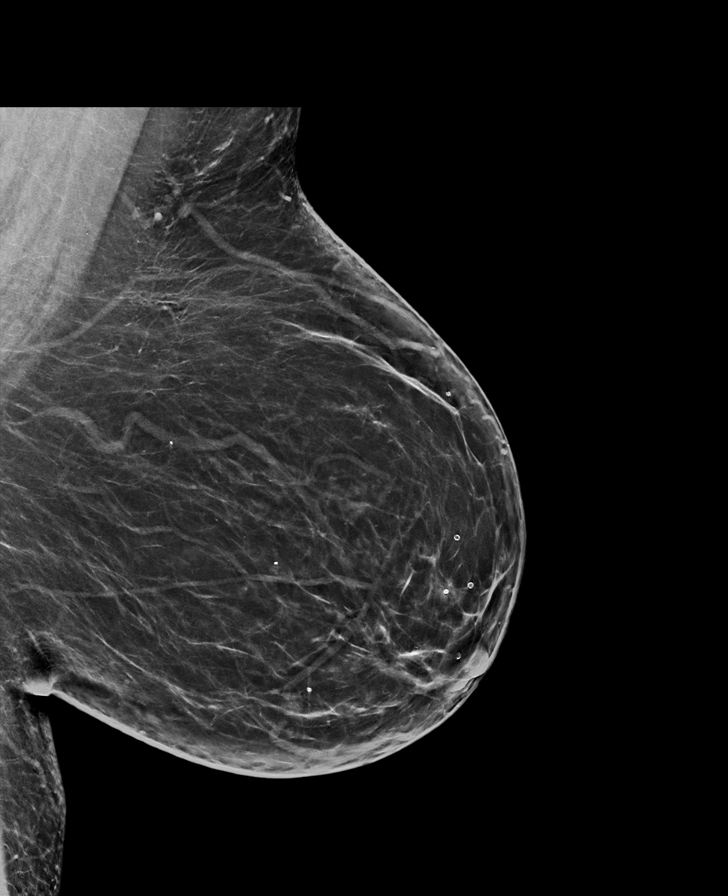

[R CC synth-2D]
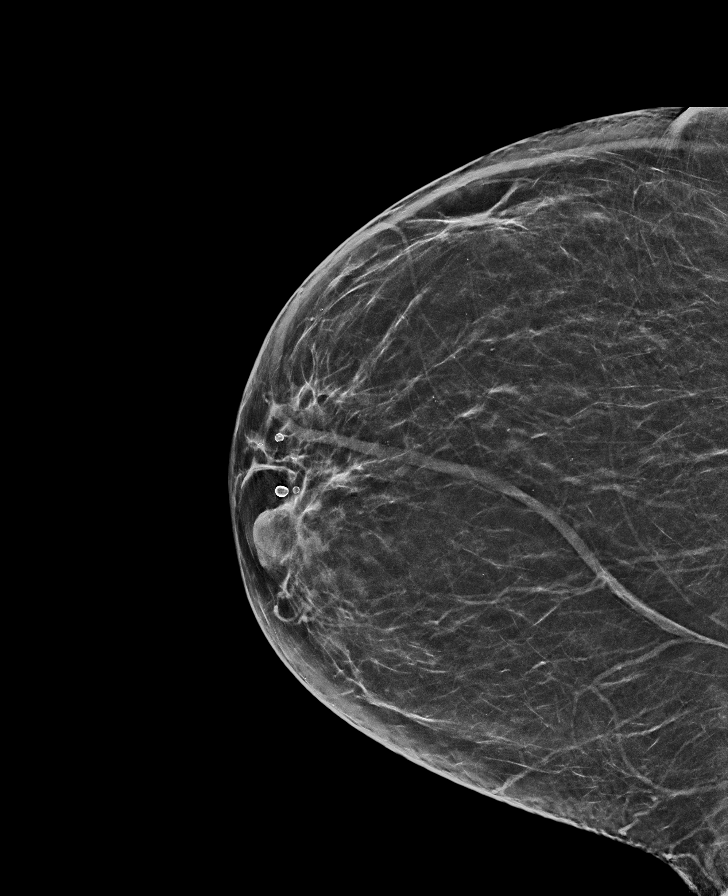

[L CC synth-2D]
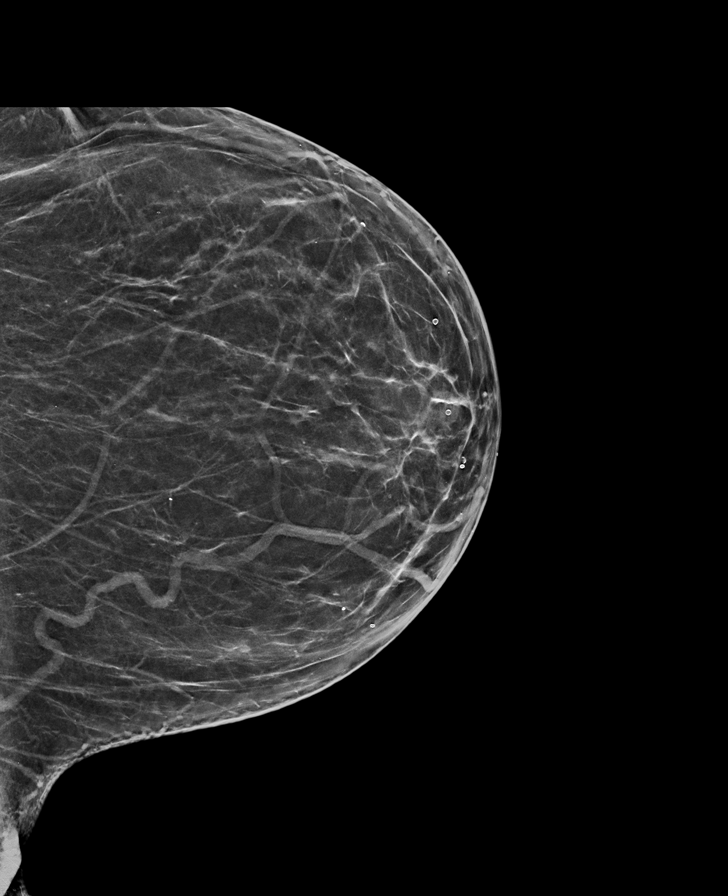

[R MLO tomo · tomo slice 37/74.0]
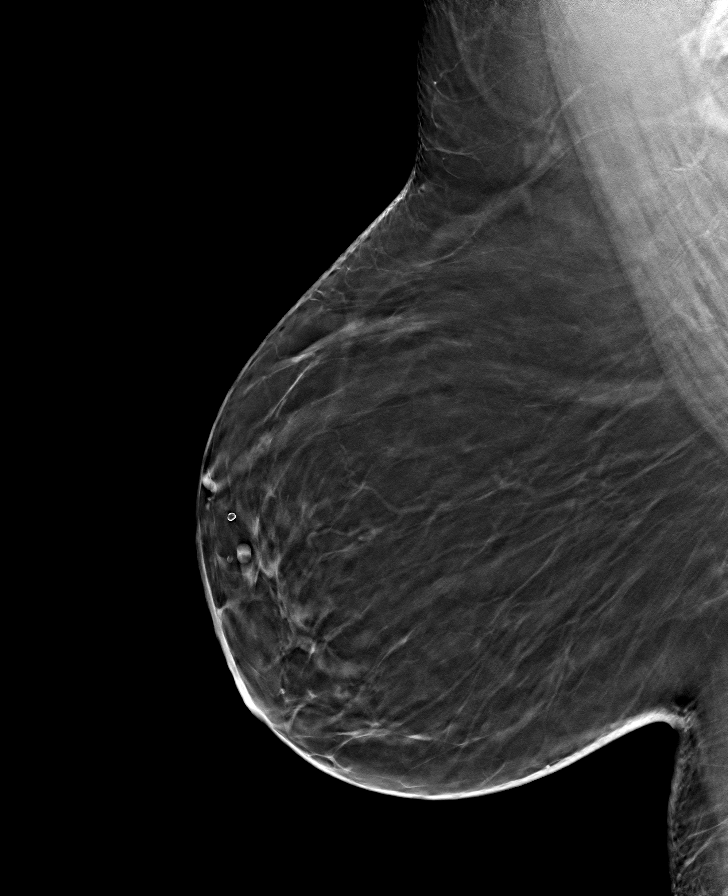

[L MLO tomo · tomo slice 37/73.0]
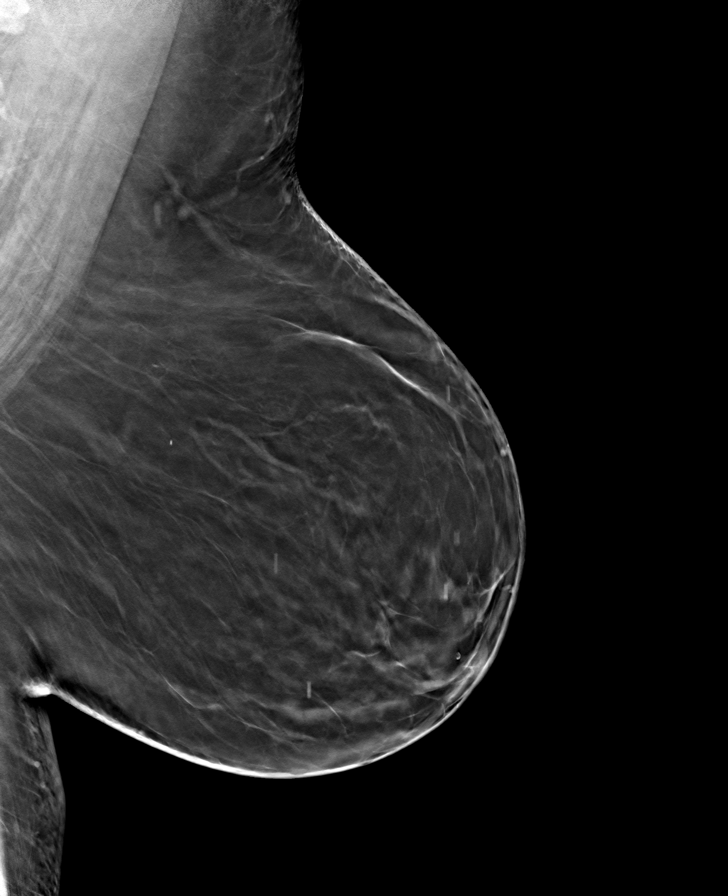

[R CC tomo · tomo slice 31/62.0]
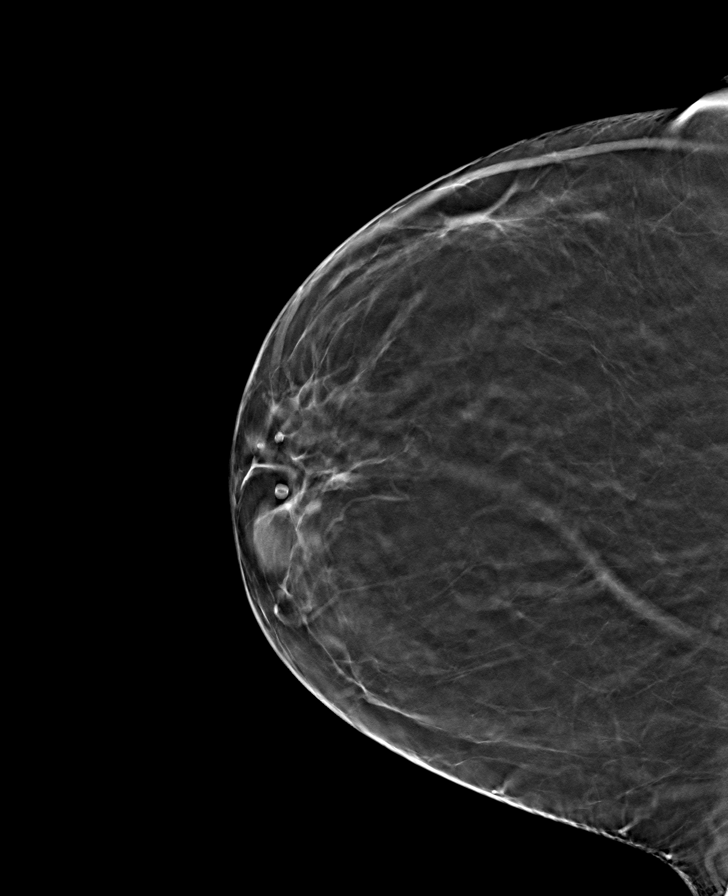

[L CC tomo · tomo slice 32/63.0]
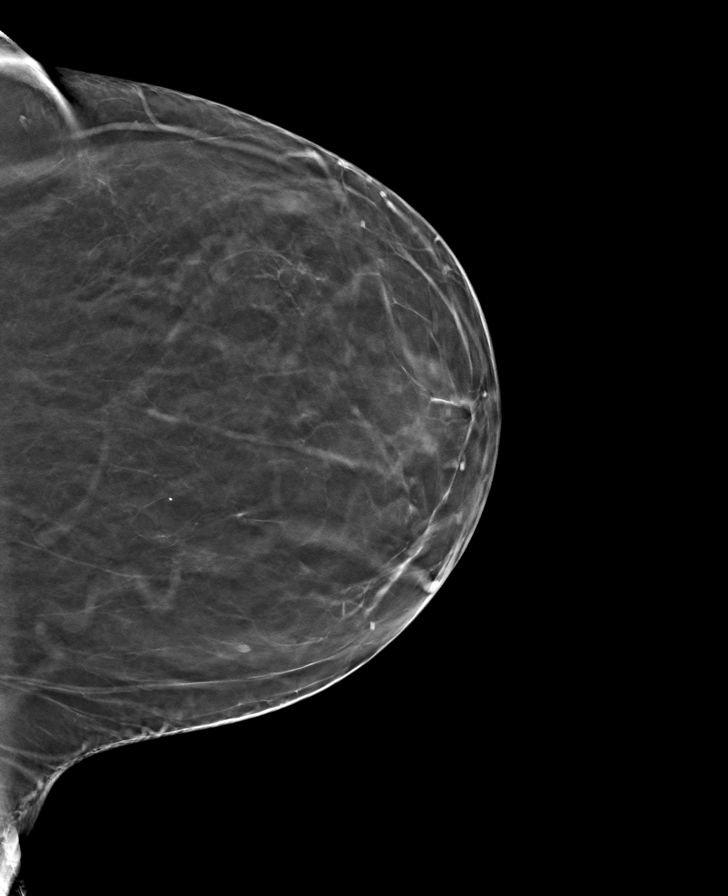

[8 of 24 positions shown; findings below may reference images not displayed]

ACR Breast Density Category b: There are scattered areas of
fibroglandular density.
FINDINGS: There are no findings suspicious for malignancy.
IMPRESSION: No mammographic evidence of malignancy. A result letter of this
screening mammogram will be mailed directly to the patient.

RECOMMENDATION:
Screening mammogram in one year. (Code:[BY])

BI-RADS CATEGORY  1: Negative.

## 2021-08-29 ENCOUNTER — Other Ambulatory Visit (HOSPITAL_COMMUNITY): Payer: Self-pay

## 2021-09-04 ENCOUNTER — Other Ambulatory Visit (HOSPITAL_COMMUNITY): Payer: Self-pay

## 2021-09-07 DIAGNOSIS — Z6841 Body Mass Index (BMI) 40.0 and over, adult: Secondary | ICD-10-CM | POA: Diagnosis not present

## 2021-09-07 DIAGNOSIS — E1142 Type 2 diabetes mellitus with diabetic polyneuropathy: Secondary | ICD-10-CM | POA: Diagnosis not present

## 2021-09-07 DIAGNOSIS — Z79899 Other long term (current) drug therapy: Secondary | ICD-10-CM | POA: Diagnosis not present

## 2021-09-07 DIAGNOSIS — G893 Neoplasm related pain (acute) (chronic): Secondary | ICD-10-CM | POA: Diagnosis not present

## 2021-09-08 ENCOUNTER — Other Ambulatory Visit (HOSPITAL_COMMUNITY): Payer: Self-pay

## 2021-09-08 MED ORDER — OZEMPIC (1 MG/DOSE) 4 MG/3ML ~~LOC~~ SOPN
PEN_INJECTOR | SUBCUTANEOUS | 2 refills | Status: DC
  Filled 2021-09-08: qty 3, 28d supply, fill #0
  Filled 2021-10-08: qty 3, 28d supply, fill #1
  Filled 2021-11-10: qty 3, 28d supply, fill #2

## 2021-09-08 MED ORDER — GABAPENTIN 100 MG PO CAPS
ORAL_CAPSULE | ORAL | 5 refills | Status: DC
  Filled 2021-09-08: qty 240, 30d supply, fill #0
  Filled 2021-10-08: qty 240, 30d supply, fill #1
  Filled 2021-11-10: qty 240, 30d supply, fill #2
  Filled 2021-12-12: qty 240, 30d supply, fill #3
  Filled 2022-01-14: qty 240, 30d supply, fill #4
  Filled 2022-02-11 – 2022-02-12 (×2): qty 240, 30d supply, fill #5

## 2021-09-08 MED ORDER — HYDROCODONE-ACETAMINOPHEN 10-325 MG PO TABS
1.0000 | ORAL_TABLET | Freq: Four times a day (QID) | ORAL | 0 refills | Status: DC | PRN
  Filled 2021-09-08: qty 120, 30d supply, fill #0

## 2021-10-07 ENCOUNTER — Other Ambulatory Visit (HOSPITAL_COMMUNITY): Payer: Self-pay

## 2021-10-07 DIAGNOSIS — G893 Neoplasm related pain (acute) (chronic): Secondary | ICD-10-CM | POA: Diagnosis not present

## 2021-10-07 DIAGNOSIS — J40 Bronchitis, not specified as acute or chronic: Secondary | ICD-10-CM | POA: Diagnosis not present

## 2021-10-07 DIAGNOSIS — E1142 Type 2 diabetes mellitus with diabetic polyneuropathy: Secondary | ICD-10-CM | POA: Diagnosis not present

## 2021-10-07 DIAGNOSIS — F411 Generalized anxiety disorder: Secondary | ICD-10-CM | POA: Diagnosis not present

## 2021-10-07 MED ORDER — ALPRAZOLAM 1 MG PO TABS
ORAL_TABLET | ORAL | 2 refills | Status: DC
  Filled 2021-10-07: qty 60, 30d supply, fill #0
  Filled 2021-11-10: qty 60, 30d supply, fill #1
  Filled 2021-12-12: qty 60, 30d supply, fill #2

## 2021-10-07 MED ORDER — AZITHROMYCIN 250 MG PO TABS
ORAL_TABLET | ORAL | 0 refills | Status: DC
  Filled 2021-10-07: qty 6, 5d supply, fill #0

## 2021-10-07 MED ORDER — HYDROCODONE-ACETAMINOPHEN 10-325 MG PO TABS
1.0000 | ORAL_TABLET | Freq: Four times a day (QID) | ORAL | 0 refills | Status: DC | PRN
  Filled 2021-10-07: qty 120, 30d supply, fill #0

## 2021-10-08 ENCOUNTER — Other Ambulatory Visit (HOSPITAL_COMMUNITY): Payer: Self-pay

## 2021-10-14 DIAGNOSIS — Z79891 Long term (current) use of opiate analgesic: Secondary | ICD-10-CM | POA: Diagnosis not present

## 2021-10-14 DIAGNOSIS — G893 Neoplasm related pain (acute) (chronic): Secondary | ICD-10-CM | POA: Diagnosis not present

## 2021-10-16 DIAGNOSIS — Z79891 Long term (current) use of opiate analgesic: Secondary | ICD-10-CM | POA: Diagnosis not present

## 2021-10-16 DIAGNOSIS — G893 Neoplasm related pain (acute) (chronic): Secondary | ICD-10-CM | POA: Diagnosis not present

## 2021-10-17 DIAGNOSIS — G893 Neoplasm related pain (acute) (chronic): Secondary | ICD-10-CM | POA: Diagnosis not present

## 2021-10-17 DIAGNOSIS — Z79891 Long term (current) use of opiate analgesic: Secondary | ICD-10-CM | POA: Diagnosis not present

## 2021-10-21 DIAGNOSIS — H52223 Regular astigmatism, bilateral: Secondary | ICD-10-CM | POA: Diagnosis not present

## 2021-10-21 DIAGNOSIS — H35033 Hypertensive retinopathy, bilateral: Secondary | ICD-10-CM | POA: Diagnosis not present

## 2021-10-21 DIAGNOSIS — H524 Presbyopia: Secondary | ICD-10-CM | POA: Diagnosis not present

## 2021-10-21 DIAGNOSIS — H5203 Hypermetropia, bilateral: Secondary | ICD-10-CM | POA: Diagnosis not present

## 2021-10-21 DIAGNOSIS — E119 Type 2 diabetes mellitus without complications: Secondary | ICD-10-CM | POA: Diagnosis not present

## 2021-10-21 DIAGNOSIS — H2513 Age-related nuclear cataract, bilateral: Secondary | ICD-10-CM | POA: Diagnosis not present

## 2021-10-23 ENCOUNTER — Other Ambulatory Visit (HOSPITAL_COMMUNITY): Payer: Self-pay

## 2021-10-23 MED ORDER — METFORMIN HCL 850 MG PO TABS
ORAL_TABLET | ORAL | 1 refills | Status: DC
  Filled 2021-10-23: qty 180, 90d supply, fill #0
  Filled 2022-01-14: qty 180, 90d supply, fill #1

## 2021-10-23 MED ORDER — VENLAFAXINE HCL ER 150 MG PO CP24
ORAL_CAPSULE | ORAL | 1 refills | Status: DC
  Filled 2021-10-23: qty 90, 90d supply, fill #0
  Filled 2022-01-14: qty 90, 90d supply, fill #1

## 2021-10-23 MED ORDER — ROSUVASTATIN CALCIUM 20 MG PO TABS
ORAL_TABLET | ORAL | 1 refills | Status: DC
  Filled 2021-10-23: qty 90, 90d supply, fill #0
  Filled 2022-01-14: qty 90, 90d supply, fill #1

## 2021-10-25 ENCOUNTER — Other Ambulatory Visit (HOSPITAL_COMMUNITY): Payer: Self-pay

## 2021-11-10 ENCOUNTER — Other Ambulatory Visit (HOSPITAL_COMMUNITY): Payer: Self-pay

## 2021-11-11 ENCOUNTER — Other Ambulatory Visit (HOSPITAL_COMMUNITY): Payer: Self-pay

## 2021-11-11 DIAGNOSIS — Z0000 Encounter for general adult medical examination without abnormal findings: Secondary | ICD-10-CM | POA: Diagnosis not present

## 2021-11-11 DIAGNOSIS — G893 Neoplasm related pain (acute) (chronic): Secondary | ICD-10-CM | POA: Diagnosis not present

## 2021-11-11 DIAGNOSIS — E1142 Type 2 diabetes mellitus with diabetic polyneuropathy: Secondary | ICD-10-CM | POA: Diagnosis not present

## 2021-11-11 DIAGNOSIS — Z6841 Body Mass Index (BMI) 40.0 and over, adult: Secondary | ICD-10-CM | POA: Diagnosis not present

## 2021-11-11 DIAGNOSIS — H538 Other visual disturbances: Secondary | ICD-10-CM | POA: Diagnosis not present

## 2021-11-11 MED ORDER — HYDROCODONE-ACETAMINOPHEN 10-325 MG PO TABS
1.0000 | ORAL_TABLET | Freq: Four times a day (QID) | ORAL | 0 refills | Status: DC | PRN
  Filled 2021-11-11: qty 120, 30d supply, fill #0

## 2021-11-11 MED ORDER — OZEMPIC (1 MG/DOSE) 4 MG/3ML ~~LOC~~ SOPN
PEN_INJECTOR | SUBCUTANEOUS | 2 refills | Status: DC
  Filled 2021-11-11: qty 9, 84d supply, fill #0
  Filled 2021-12-24: qty 3, 28d supply, fill #0
  Filled 2022-01-23: qty 3, 28d supply, fill #1
  Filled 2022-02-19: qty 3, 28d supply, fill #2
  Filled 2022-04-22: qty 3, 28d supply, fill #3

## 2021-11-12 DIAGNOSIS — Z79891 Long term (current) use of opiate analgesic: Secondary | ICD-10-CM | POA: Diagnosis not present

## 2021-11-12 DIAGNOSIS — G893 Neoplasm related pain (acute) (chronic): Secondary | ICD-10-CM | POA: Diagnosis not present

## 2021-11-16 DIAGNOSIS — Z79891 Long term (current) use of opiate analgesic: Secondary | ICD-10-CM | POA: Diagnosis not present

## 2021-11-16 DIAGNOSIS — G893 Neoplasm related pain (acute) (chronic): Secondary | ICD-10-CM | POA: Diagnosis not present

## 2021-12-09 DIAGNOSIS — F411 Generalized anxiety disorder: Secondary | ICD-10-CM | POA: Diagnosis not present

## 2021-12-09 DIAGNOSIS — Z79899 Other long term (current) drug therapy: Secondary | ICD-10-CM | POA: Diagnosis not present

## 2021-12-09 DIAGNOSIS — G893 Neoplasm related pain (acute) (chronic): Secondary | ICD-10-CM | POA: Diagnosis not present

## 2021-12-09 DIAGNOSIS — E1142 Type 2 diabetes mellitus with diabetic polyneuropathy: Secondary | ICD-10-CM | POA: Diagnosis not present

## 2021-12-09 DIAGNOSIS — H538 Other visual disturbances: Secondary | ICD-10-CM | POA: Diagnosis not present

## 2021-12-10 ENCOUNTER — Other Ambulatory Visit (HOSPITAL_COMMUNITY): Payer: Self-pay

## 2021-12-10 MED ORDER — HYDROCODONE-ACETAMINOPHEN 10-325 MG PO TABS
1.0000 | ORAL_TABLET | Freq: Four times a day (QID) | ORAL | 0 refills | Status: DC | PRN
  Filled 2021-12-10: qty 120, 30d supply, fill #0

## 2021-12-12 ENCOUNTER — Other Ambulatory Visit (HOSPITAL_COMMUNITY): Payer: Self-pay

## 2021-12-12 DIAGNOSIS — Z79891 Long term (current) use of opiate analgesic: Secondary | ICD-10-CM | POA: Diagnosis not present

## 2021-12-12 DIAGNOSIS — G893 Neoplasm related pain (acute) (chronic): Secondary | ICD-10-CM | POA: Diagnosis not present

## 2021-12-24 ENCOUNTER — Other Ambulatory Visit (HOSPITAL_COMMUNITY): Payer: Self-pay

## 2022-01-12 DIAGNOSIS — G893 Neoplasm related pain (acute) (chronic): Secondary | ICD-10-CM | POA: Diagnosis not present

## 2022-01-12 DIAGNOSIS — Z79891 Long term (current) use of opiate analgesic: Secondary | ICD-10-CM | POA: Diagnosis not present

## 2022-01-13 ENCOUNTER — Other Ambulatory Visit (HOSPITAL_COMMUNITY): Payer: Self-pay

## 2022-01-13 DIAGNOSIS — Z79899 Other long term (current) drug therapy: Secondary | ICD-10-CM | POA: Diagnosis not present

## 2022-01-13 DIAGNOSIS — E1140 Type 2 diabetes mellitus with diabetic neuropathy, unspecified: Secondary | ICD-10-CM | POA: Diagnosis not present

## 2022-01-13 DIAGNOSIS — G893 Neoplasm related pain (acute) (chronic): Secondary | ICD-10-CM | POA: Diagnosis not present

## 2022-01-13 DIAGNOSIS — F411 Generalized anxiety disorder: Secondary | ICD-10-CM | POA: Diagnosis not present

## 2022-01-13 DIAGNOSIS — Z6839 Body mass index (BMI) 39.0-39.9, adult: Secondary | ICD-10-CM | POA: Diagnosis not present

## 2022-01-13 DIAGNOSIS — E1142 Type 2 diabetes mellitus with diabetic polyneuropathy: Secondary | ICD-10-CM | POA: Diagnosis not present

## 2022-01-13 MED ORDER — HYDROCODONE-ACETAMINOPHEN 10-325 MG PO TABS
1.0000 | ORAL_TABLET | Freq: Four times a day (QID) | ORAL | 0 refills | Status: DC | PRN
  Filled 2022-01-13: qty 120, 30d supply, fill #0

## 2022-01-13 MED ORDER — ALPRAZOLAM 1 MG PO TABS
1.0000 mg | ORAL_TABLET | Freq: Two times a day (BID) | ORAL | 2 refills | Status: DC | PRN
  Filled 2022-01-13: qty 60, 30d supply, fill #0
  Filled 2022-02-11: qty 60, 30d supply, fill #1
  Filled 2022-03-18: qty 60, 30d supply, fill #2

## 2022-01-15 ENCOUNTER — Other Ambulatory Visit (HOSPITAL_COMMUNITY): Payer: Self-pay

## 2022-01-23 ENCOUNTER — Other Ambulatory Visit (HOSPITAL_COMMUNITY): Payer: Self-pay

## 2022-01-29 ENCOUNTER — Other Ambulatory Visit (HOSPITAL_COMMUNITY): Payer: Self-pay

## 2022-01-30 ENCOUNTER — Other Ambulatory Visit (HOSPITAL_COMMUNITY): Payer: Self-pay

## 2022-02-04 ENCOUNTER — Other Ambulatory Visit (HOSPITAL_COMMUNITY): Payer: Self-pay

## 2022-02-10 DIAGNOSIS — E1142 Type 2 diabetes mellitus with diabetic polyneuropathy: Secondary | ICD-10-CM | POA: Diagnosis not present

## 2022-02-10 DIAGNOSIS — G893 Neoplasm related pain (acute) (chronic): Secondary | ICD-10-CM | POA: Diagnosis not present

## 2022-02-10 DIAGNOSIS — Z79899 Other long term (current) drug therapy: Secondary | ICD-10-CM | POA: Diagnosis not present

## 2022-02-10 DIAGNOSIS — E1140 Type 2 diabetes mellitus with diabetic neuropathy, unspecified: Secondary | ICD-10-CM | POA: Diagnosis not present

## 2022-02-11 ENCOUNTER — Other Ambulatory Visit (HOSPITAL_COMMUNITY): Payer: Self-pay

## 2022-02-11 MED ORDER — HYDROCODONE-ACETAMINOPHEN 10-325 MG PO TABS
1.0000 | ORAL_TABLET | Freq: Four times a day (QID) | ORAL | 0 refills | Status: DC | PRN
  Filled 2022-02-11: qty 120, 30d supply, fill #0

## 2022-02-12 ENCOUNTER — Other Ambulatory Visit (HOSPITAL_COMMUNITY): Payer: Self-pay

## 2022-02-12 DIAGNOSIS — Z79891 Long term (current) use of opiate analgesic: Secondary | ICD-10-CM | POA: Diagnosis not present

## 2022-02-12 DIAGNOSIS — G893 Neoplasm related pain (acute) (chronic): Secondary | ICD-10-CM | POA: Diagnosis not present

## 2022-02-19 ENCOUNTER — Other Ambulatory Visit (HOSPITAL_COMMUNITY): Payer: Self-pay

## 2022-03-10 ENCOUNTER — Emergency Department (HOSPITAL_COMMUNITY): Payer: 59

## 2022-03-10 ENCOUNTER — Other Ambulatory Visit: Payer: Self-pay

## 2022-03-10 ENCOUNTER — Observation Stay (HOSPITAL_COMMUNITY)
Admission: EM | Admit: 2022-03-10 | Discharge: 2022-03-11 | Discharge status: Against Medical Advice | Payer: 59 | Attending: Family Medicine | Admitting: Family Medicine

## 2022-03-10 ENCOUNTER — Encounter (HOSPITAL_COMMUNITY): Payer: Self-pay | Admitting: *Deleted

## 2022-03-10 ENCOUNTER — Other Ambulatory Visit (HOSPITAL_COMMUNITY): Payer: Self-pay

## 2022-03-10 DIAGNOSIS — Z6841 Body Mass Index (BMI) 40.0 and over, adult: Secondary | ICD-10-CM | POA: Diagnosis not present

## 2022-03-10 DIAGNOSIS — J45901 Unspecified asthma with (acute) exacerbation: Secondary | ICD-10-CM | POA: Diagnosis not present

## 2022-03-10 DIAGNOSIS — Z7982 Long term (current) use of aspirin: Secondary | ICD-10-CM | POA: Insufficient documentation

## 2022-03-10 DIAGNOSIS — Z1152 Encounter for screening for COVID-19: Secondary | ICD-10-CM | POA: Diagnosis not present

## 2022-03-10 DIAGNOSIS — R7309 Other abnormal glucose: Secondary | ICD-10-CM | POA: Diagnosis not present

## 2022-03-10 DIAGNOSIS — Z8673 Personal history of transient ischemic attack (TIA), and cerebral infarction without residual deficits: Secondary | ICD-10-CM | POA: Insufficient documentation

## 2022-03-10 DIAGNOSIS — J9601 Acute respiratory failure with hypoxia: Secondary | ICD-10-CM | POA: Diagnosis not present

## 2022-03-10 DIAGNOSIS — Z7984 Long term (current) use of oral hypoglycemic drugs: Secondary | ICD-10-CM | POA: Insufficient documentation

## 2022-03-10 DIAGNOSIS — Z8541 Personal history of malignant neoplasm of cervix uteri: Secondary | ICD-10-CM | POA: Diagnosis not present

## 2022-03-10 DIAGNOSIS — F17210 Nicotine dependence, cigarettes, uncomplicated: Secondary | ICD-10-CM | POA: Insufficient documentation

## 2022-03-10 DIAGNOSIS — J80 Acute respiratory distress syndrome: Secondary | ICD-10-CM | POA: Diagnosis not present

## 2022-03-10 DIAGNOSIS — E119 Type 2 diabetes mellitus without complications: Secondary | ICD-10-CM | POA: Insufficient documentation

## 2022-03-10 DIAGNOSIS — J209 Acute bronchitis, unspecified: Secondary | ICD-10-CM | POA: Diagnosis not present

## 2022-03-10 DIAGNOSIS — R061 Stridor: Secondary | ICD-10-CM | POA: Diagnosis not present

## 2022-03-10 DIAGNOSIS — E1142 Type 2 diabetes mellitus with diabetic polyneuropathy: Secondary | ICD-10-CM | POA: Diagnosis not present

## 2022-03-10 DIAGNOSIS — Z85048 Personal history of other malignant neoplasm of rectum, rectosigmoid junction, and anus: Secondary | ICD-10-CM | POA: Insufficient documentation

## 2022-03-10 DIAGNOSIS — J449 Chronic obstructive pulmonary disease, unspecified: Secondary | ICD-10-CM | POA: Diagnosis not present

## 2022-03-10 DIAGNOSIS — E876 Hypokalemia: Secondary | ICD-10-CM | POA: Diagnosis not present

## 2022-03-10 DIAGNOSIS — Z8551 Personal history of malignant neoplasm of bladder: Secondary | ICD-10-CM | POA: Insufficient documentation

## 2022-03-10 DIAGNOSIS — E1140 Type 2 diabetes mellitus with diabetic neuropathy, unspecified: Secondary | ICD-10-CM | POA: Diagnosis not present

## 2022-03-10 DIAGNOSIS — E039 Hypothyroidism, unspecified: Secondary | ICD-10-CM | POA: Insufficient documentation

## 2022-03-10 DIAGNOSIS — Z79899 Other long term (current) drug therapy: Secondary | ICD-10-CM | POA: Diagnosis not present

## 2022-03-10 DIAGNOSIS — R069 Unspecified abnormalities of breathing: Secondary | ICD-10-CM | POA: Diagnosis not present

## 2022-03-10 DIAGNOSIS — R000 Tachycardia, unspecified: Secondary | ICD-10-CM | POA: Diagnosis not present

## 2022-03-10 DIAGNOSIS — G893 Neoplasm related pain (acute) (chronic): Secondary | ICD-10-CM | POA: Diagnosis not present

## 2022-03-10 DIAGNOSIS — R0689 Other abnormalities of breathing: Secondary | ICD-10-CM | POA: Diagnosis not present

## 2022-03-10 DIAGNOSIS — R0602 Shortness of breath: Secondary | ICD-10-CM | POA: Diagnosis not present

## 2022-03-10 HISTORY — DX: Malignant neoplasm of bladder, unspecified: C67.9

## 2022-03-10 HISTORY — DX: Malignant neoplasm of anus, unspecified: C21.0

## 2022-03-10 LAB — BLOOD GAS, VENOUS
Acid-base deficit: 3.6 mmol/L — ABNORMAL HIGH (ref 0.0–2.0)
Bicarbonate: 20.6 mmol/L (ref 20.0–28.0)
Drawn by: 6892
O2 Saturation: 99.5 %
Patient temperature: 36.7
pCO2, Ven: 34 mmHg — ABNORMAL LOW (ref 44–60)
pH, Ven: 7.39 (ref 7.25–7.43)
pO2, Ven: 119 mmHg — ABNORMAL HIGH (ref 32–45)

## 2022-03-10 LAB — CBC WITH DIFFERENTIAL/PLATELET
Abs Immature Granulocytes: 0.14 10*3/uL — ABNORMAL HIGH (ref 0.00–0.07)
Basophils Absolute: 0 10*3/uL (ref 0.0–0.1)
Basophils Relative: 1 %
Eosinophils Absolute: 0 10*3/uL (ref 0.0–0.5)
Eosinophils Relative: 0 %
HCT: 40.8 % (ref 36.0–46.0)
Hemoglobin: 13.6 g/dL (ref 12.0–15.0)
Immature Granulocytes: 2 %
Lymphocytes Relative: 13 %
Lymphs Abs: 1.1 10*3/uL (ref 0.7–4.0)
MCH: 30 pg (ref 26.0–34.0)
MCHC: 33.3 g/dL (ref 30.0–36.0)
MCV: 90.1 fL (ref 80.0–100.0)
Monocytes Absolute: 0.2 10*3/uL (ref 0.1–1.0)
Monocytes Relative: 2 %
Neutro Abs: 7.2 10*3/uL (ref 1.7–7.7)
Neutrophils Relative %: 82 %
Platelets: 298 10*3/uL (ref 150–400)
RBC: 4.53 MIL/uL (ref 3.87–5.11)
RDW: 14.3 % (ref 11.5–15.5)
WBC: 8.7 10*3/uL (ref 4.0–10.5)
nRBC: 0 % (ref 0.0–0.2)

## 2022-03-10 LAB — BASIC METABOLIC PANEL
Anion gap: 14 (ref 5–15)
BUN: 8 mg/dL (ref 6–20)
CO2: 19 mmol/L — ABNORMAL LOW (ref 22–32)
Calcium: 8.8 mg/dL — ABNORMAL LOW (ref 8.9–10.3)
Chloride: 106 mmol/L (ref 98–111)
Creatinine, Ser: 0.79 mg/dL (ref 0.44–1.00)
GFR, Estimated: >60 mL/min (ref >60)
Glucose, Bld: 304 mg/dL — ABNORMAL HIGH (ref 70–99)
Potassium: 3 mmol/L — ABNORMAL LOW (ref 3.5–5.1)
Sodium: 139 mmol/L (ref 135–145)

## 2022-03-10 LAB — SARS CORONAVIRUS 2 BY RT PCR: SARS Coronavirus 2 by RT PCR: NEGATIVE

## 2022-03-10 MED ORDER — HYDROCODONE-ACETAMINOPHEN 10-325 MG PO TABS
1.0000 | ORAL_TABLET | Freq: Four times a day (QID) | ORAL | 0 refills | Status: DC | PRN
  Filled 2022-03-10: qty 120, 30d supply, fill #0

## 2022-03-10 MED ORDER — ALBUTEROL (5 MG/ML) CONTINUOUS INHALATION SOLN
10.0000 mg/h | INHALATION_SOLUTION | RESPIRATORY_TRACT | Status: AC
  Administered 2022-03-10: 10 mg/h via RESPIRATORY_TRACT
  Filled 2022-03-10: qty 20

## 2022-03-10 MED ORDER — LORAZEPAM 2 MG/ML IJ SOLN
1.0000 mg | Freq: Once | INTRAMUSCULAR | Status: AC
  Administered 2022-03-10: 1 mg via INTRAVENOUS
  Filled 2022-03-10: qty 1

## 2022-03-10 MED ORDER — GABAPENTIN 100 MG PO CAPS
200.0000 mg | ORAL_CAPSULE | Freq: Four times a day (QID) | ORAL | 5 refills | Status: DC
  Filled 2022-03-10: qty 240, 30d supply, fill #0
  Filled 2022-05-14: qty 240, 30d supply, fill #1
  Filled 2022-06-16: qty 240, 30d supply, fill #2
  Filled 2022-07-15: qty 240, 30d supply, fill #3
  Filled 2022-08-24 – 2022-12-29 (×2): qty 240, 30d supply, fill #4
  Filled 2023-01-28: qty 240, 30d supply, fill #5

## 2022-03-10 MED ORDER — IPRATROPIUM-ALBUTEROL 0.5-2.5 (3) MG/3ML IN SOLN
3.0000 mL | RESPIRATORY_TRACT | Status: DC | PRN
  Administered 2022-03-10: 3 mL via RESPIRATORY_TRACT

## 2022-03-10 MED ORDER — IPRATROPIUM-ALBUTEROL 0.5-2.5 (3) MG/3ML IN SOLN
RESPIRATORY_TRACT | Status: AC
  Filled 2022-03-10: qty 3

## 2022-03-10 MED ORDER — ALBUTEROL SULFATE (2.5 MG/3ML) 0.083% IN NEBU
INHALATION_SOLUTION | RESPIRATORY_TRACT | Status: AC
  Filled 2022-03-10: qty 12

## 2022-03-10 MED ORDER — MAGNESIUM SULFATE 2 GM/50ML IV SOLN
2.0000 g | Freq: Once | INTRAVENOUS | Status: AC
  Administered 2022-03-10: 2 g via INTRAVENOUS
  Filled 2022-03-10: qty 50

## 2022-03-10 MED ORDER — HYDROMORPHONE HCL 1 MG/ML IJ SOLN
0.5000 mg | Freq: Once | INTRAMUSCULAR | Status: AC
  Administered 2022-03-10: 0.5 mg via INTRAVENOUS
  Filled 2022-03-10: qty 0.5

## 2022-03-10 NOTE — ED Provider Notes (Signed)
[facility] EMERGENCY DEPARTMENT Provider Note   CSN: [id number] Arrival date & time: 03/10/22  1844     History {Add pertinent medical, surgical, social history, OB history to HPI:1} Chief Complaint  Patient presents with   Shortness of Breath    Denise Ray is a 51 y.o. female with history of asthma/COPD, diabetes, smoking, acute respiratory failure requiring intubation in 2015 2016 due to community pneumonia and asthma), presented to ED with shortness of breath.  Patient reports onset of symptoms maybe 2 to 3 days ago.  Significantly worsened today.  Went to see her doctor who gave the patient a shot of intramuscular steroids advised to come to the ED.  EMS gave the patient 125 mg of IV Solu-Medrol as well as nebulizer treatments.  The patient has been doing frequent nebulizer treatments at home.  Her breathing is still bad.  HPI     Home Medications Prior to Admission medications   Medication Sig Start Date End Date Taking? Authorizing Provider  albuterol (PROVENTIL HFA;VENTOLIN HFA) 108 (90 BASE) MCG/ACT inhaler Inhale 2 puffs into the lungs every 6 (six) hours as needed. wheezing 01/06/13   Lysbeth Penner, FNP  albuterol (PROVENTIL) (5 MG/ML) 0.5% nebulizer solution Take 2.5 mg by nebulization every 6 (six) hours as needed for wheezing or shortness of breath.    [provider]  ALPRAZolam Duanne Moron) 1 MG tablet Take 1 (one) tablet tablet by mouth two times daily as needed 10/07/21     ALPRAZolam (XANAX) 1 MG tablet Take 1 tablet (1 mg total) by mouth 2 (two) times daily as needed. 01/13/22     Ascorbic Acid (VITAMIN C) 1000 MG tablet Take 2 (two) Tablet by mouth daily 10/29/20     aspirin EC 81 MG tablet Take 81 mg by mouth daily.    [provider]  azithromycin (ZITHROMAX Z-PAK) 250 MG tablet Take 2 tablets by mouth on the first day then 1 tablet by mouth once daily until finished 10/07/21     Cholecalciferol (VITAMIN D) 50 MCG (2000 UT) tablet Take 1 (one)  Tablet by mouth daily 10/29/20     famotidine (PEPCID) 20 MG tablet Take 20 mg by mouth at bedtime.     [provider]  gabapentin (NEURONTIN) 100 MG capsule Take 2 capsules (200 mg total) by mouth 4 (four) times daily. 03/10/22     HYDROcodone-acetaminophen (NORCO) 10-325 MG tablet Take 1 tablet by mouth every 6 hours as needed for pain 03/10/22     levothyroxine (SYNTHROID) 50 MCG tablet Take 1 tablet by mouth daily 05/04/21     lidocaine (XYLOCAINE) 2 % jelly Apply 1 application topically every 6 (six) hours as needed (for anal pain).  01/22/17   [provider]  loratadine (CLARITIN) 10 MG tablet Take 10 mg by mouth at bedtime.     [provider]  metFORMIN (GLUCOPHAGE) 850 MG tablet Take 1 tablet by mouth two times daily 08/27/20     metFORMIN (GLUCOPHAGE) 850 MG tablet Take 1 tablet by mouth 2 times daily 10/23/21     Multiple Vitamins-Calcium (ONE-A-DAY WOMENS FORMULA PO) Take by mouth daily.     [provider]  Omega-3 Fatty Acids (OMEGA-3 FISH OIL PO) Take 720 mg by mouth in the morning and at bedtime.    [provider]  pantoprazole (PROTONIX) 40 MG tablet Take 1 tablet by mouth daily. 05/04/21     polyethylene glycol (MIRALAX / GLYCOLAX) packet Take 17 g by mouth  daily as needed for mild constipation.    [provider]  rosuvastatin (CRESTOR) 20 MG tablet Take 1 table by mouth at bedtime 10/23/21     Semaglutide, 1 MG/DOSE, (OZEMPIC, 1 MG/DOSE,) 4 MG/3ML SOPN Inject 1  mL under skin once a week. 08/04/21     Semaglutide, 1 MG/DOSE, (OZEMPIC, 1 MG/DOSE,) 4 MG/3ML SOPN Inject 1 ml under the skin once weekly 09/07/21     Semaglutide, 1 MG/DOSE, (OZEMPIC, 1 MG/DOSE,) 4 MG/3ML SOPN Inject 1 (one) mg under the skin weekly 11/11/21     Semaglutide,0.25 or 0.'5MG'$ /DOS, (OZEMPIC, 0.25 OR 0.5 MG/DOSE,) 2 MG/3ML SOPN Inject 0.'5mg'$  under the skin once a week 03/04/21   Emelia Loron, NP  sulfamethoxazole-trimethoprim (BACTRIM DS) 800-160 MG tablet Take 1  tablet by mouth 2 (two) times daily. 04/14/21   Megan Salon, MD  topiramate (TOPAMAX) 25 MG tablet Take 1 tablet  by mouth two times daily 06/03/21     venlafaxine XR (EFFEXOR-XR) 150 MG 24 hr capsule Take 1 (one) Capsule by mouth daily 09/27/20     venlafaxine XR (EFFEXOR-XR) 150 MG 24 hr capsule Take 1 capsule by mouth once daily 10/23/21     Zinc 50 MG TABS Take 1 (one) Tablet by mouth daily 10/29/20         Allergies    Oseltamivir phosphate, Estrogens, Wellbutrin [bupropion], Ibuprofen, Other, and Morphine and related    Review of Systems   Review of Systems  Physical Exam Updated Vital Signs BP (!) 158/105 (BP Location: Right Arm)   Pulse 99   Temp 98.5 F (36.9 C) (Axillary)   Resp 12   Ht '5\' 4"'$  (1.626 m)   Wt 108.4 kg   SpO2 100%   BMI 41.02 kg/m  Physical Exam Constitutional:      General: She is not in acute distress. HENT:     Head: Normocephalic and atraumatic.  Eyes:     Conjunctiva/sclera: Conjunctivae normal.     Pupils: Pupils are equal, round, and reactive to light.  Cardiovascular:     Rate and Rhythm: Regular rhythm. Tachycardia present.  Pulmonary:     Effort: Pulmonary effort is normal. No respiratory distress.     Comments: Speaking short sentences of muscle retractions, diffuse wheezing bilaterally, nonrebreather Abdominal:     General: There is no distension.     Tenderness: There is no abdominal tenderness.  Skin:    General: Skin is warm and dry.  Neurological:     General: No focal deficit present.     Mental Status: She is alert. Mental status is at baseline.  Psychiatric:        Mood and Affect: Mood normal.        Behavior: Behavior normal.     ED Results / Procedures / Treatments   Labs (all labs ordered are listed, but only abnormal results are displayed) Labs Reviewed  BASIC METABOLIC PANEL - Abnormal; Notable for the following components:      Result Value   Potassium 3.0 (*)    CO2 19 (*)    Glucose, Bld 304 (*)    Calcium  8.8 (*)    All other components within normal limits  CBC WITH DIFFERENTIAL/PLATELET - Abnormal; Notable for the following components:   Abs Immature Granulocytes 0.14 (*)    All other components within normal limits  BLOOD GAS, VENOUS - Abnormal; Notable for the following components:   pCO2, Ven 34 (*)    pO2, Ven 119 (*)  Acid-base deficit 3.6 (*)    All other components within normal limits  SARS CORONAVIRUS 2 BY RT PCR    EKG EKG Interpretation  Date/Time:  Tuesday March 10 2022 18:52:00 EDT Ventricular Rate:  117 PR Interval:  143 QRS Duration: 91 QT Interval:  356 QTC Calculation: 497 R Axis:   27 Text Interpretation: Sinus tachycardia Low voltage, precordial leads RSR' in V1 or V2, probably normal variant Borderline repolarization abnormality Baseline wander in lead(s) II aVF V5 V6 Confirmed by Octaviano Glow [phone]) on 03/10/2022 8:28:30 PM  Radiology DG Chest Port 1 View  Result Date: 03/10/2022 CLINICAL DATA:  Shortness of breath for several hours, initial encounter EXAM: PORTABLE CHEST 1 VIEW COMPARISON:  03/24/2017 FINDINGS: Cardiac shadow is stable. Mild vascular congestion is noted without interstitial edema. No focal infiltrate or effusion is noted. No acute bony abnormality is seen. IMPRESSION: Mild vascular congestion without edema. Electronically Signed   By: Inez Catalina M.D.   On: 03/10/2022 19:20    Procedures .Critical Care  Performed by: Wyvonnia Dusky, MD Authorized by: Wyvonnia Dusky, MD   Critical care provider statement:    Critical care time (minutes):  45   Critical care time was exclusive of:  Separately billable procedures and treating other patients   Critical care was necessary to treat or prevent imminent or life-threatening deterioration of the following conditions:  Respiratory failure   Critical care was time spent personally by me on the following activities:  Ordering and performing treatments and interventions, ordering and  review of laboratory studies, ordering and review of radiographic studies, pulse oximetry, review of old charts, examination of patient and evaluation of patient's response to treatment Comments:     Asthma exacerbation requiring BiPAP   {Document cardiac monitor, telemetry assessment procedure when appropriate:1}  Medications Ordered in ED Medications  albuterol (PROVENTIL,VENTOLIN) solution continuous neb (10 mg/hr Nebulization New Bag/Given 03/10/22 1903)  LORazepam (ATIVAN) injection 1 mg (has no administration in time range)  HYDROmorphone (DILAUDID) injection 0.5 mg (has no administration in time range)  magnesium sulfate IVPB 2 g 50 mL (0 g Intravenous Stopped 03/10/22 2006)  albuterol (PROVENTIL) (2.5 MG/3ML) 0.083% nebulizer solution (  Given 03/10/22 1903)    ED Course/ Medical Decision Making/ A&P Clinical Course as of 03/10/22 2029  Tue Mar 10, 2022  2026 Patient reassessed, attempted to take off the BiPAP but her work of breathing once again increases.  She is more comfortable on the BiPAP.  I think she will need to be maintained overnight likely on BiPAP.  She is agreeable to staying at this time.  I reviewed her labs do not see a leukocytosis or evidence of pneumonia on her chest x-ray.  She does not have any significant hypercapnic respiratory acidosis.  She does have some hypokalemia which likely related to the large amounts of albuterol that she has received and given herself at home. [MT]    Clinical Course User Index [MT] Glessie Eustice, Carola Rhine, MD                           Medical Decision Making Amount and/or Complexity of Data Reviewed Labs: ordered. Radiology: ordered.  Risk Prescription drug management. Decision regarding hospitalization.   This patient presents to the ED with concern for worsening shortness of breath. This involves an extensive number of treatment options, and is a complaint that carries with it a high risk of complications and morbidity.  The  differential diagnosis includes asthma exacerbation most likely, versus pneumonia, versus pneumothorax, versus other  Co-morbidities that complicate the patient evaluation: smoking, asthma  Additional history obtained from EMS  I ordered and personally interpreted labs.  The pertinent results include:  mild hypoK likely from albuterol, no other sig findings  I ordered imaging studies including xray chest I independently visualized and interpreted imaging which showed no focal infiltrate I agree with the radiologist interpretation  The patient was maintained on a cardiac monitor.  I personally viewed and interpreted the cardiac monitored which showed an underlying rhythm of: sinus tachycardia  Per my interpretation the patient's ECG shows sinus tachycardia  I ordered medication including iv mag, bipap, albuterol.  Ativan and dilaudid (for chronic pain and anxiety - PDMP queried for home xanax and norco)  I have reviewed the patients home medicines and have made adjustments as needed  Test Considered: low suspicion for acute PE  After the interventions noted above, I reevaluated the patient and found that they have: stayed the same  Social Determinants of Health:Smoking cessation advised  Dispostion:  After consideration of the diagnostic results and the patients response to treatment, I feel that the patent would benefit from medical admission.   {Document critical care time when appropriate:1} {Document review of labs and clinical decision tools ie heart score, Chads2Vasc2 etc:1}  {Document your independent review of radiology images, and any outside records:1} {Document your discussion with family members, caretakers, and with consultants:1} {Document social determinants of health affecting pt's care:1} {Document your decision making why or why not admission, treatments were needed:1} Final Clinical Impression(s) / ED Diagnoses Final diagnoses:  Exacerbation of asthma,  unspecified asthma severity, unspecified whether persistent    Rx / DC Orders ED Discharge Orders     None

## 2022-03-10 NOTE — ED Triage Notes (Signed)
Pt brought in by [facility] from home with c/o SOB all day. Pt took several breathing treatments at home with no relief. Pt went to her doctor and was given steroid injection with still no improvement so she called 911. EMS reports pt had increased work of breathing, audible wheezing, possible stridor, O2 sat 98% RA, HR 115 when they arrived to pt's home. Pt given 2 Albuterol nebs, 1 Duoneb and Racemic Epi and '125mg'$  Solumedrol given with no relief in symptoms.    RT called to bedside. EDP ordered bipap and RT getting it setup at this time.

## 2022-03-10 NOTE — H&P (Signed)
TRH H&P    Patient Demographics:    Denise Ray, is a 51 y.o. female  MRN: [id number]  DOB - [birth year]  Admit Date - 03/10/2022  Referring MD/NP/PA: Octaviano Glow  Outpatient Primary MD for the patient is Emelia Loron, NP  Patient coming from: Home  Chief complaint-shortness of breath   HPI:    Denise Ray  is a 51 y.o. female, with history of anal cancer, asthma, diabetes mellitus type 2, tobacco abuse, respiratory failure from pneumonia/asthma requiring intubation x2 in the past, was brought to the ED by EMS for worsening shortness of breath.  Patient went to see her PCP today who gave her a shot of intramuscular steroids and advised her to come to ED.  EMS also gave patient 125 mg of Solu-Medrol as well as nebulizer treatment. Patient's mother states that patient was short of breath and then became unresponsive, she called 911.  EMS arrived and they gave her CPR for a short duration however patient became alert soon after.  Currently patient requiring BiPAP.  Also given magnesium sulfate 2 g IV x1.  Along with continuous albuterol nebulizer.  Patient is breathing much better after getting above treatments. She denies nausea vomiting or diarrhea Complains of chest pain from CPR she received at home. Denies fever or chills     Review of systems:    In addition to the HPI above,   All other systems reviewed and are negative.    Past History of the following :    Past Medical History:  Diagnosis Date   Anal cancer ([place])    Anal polyp    Anxiety    Arthritis    Right hip   Bladder cancer ([place])    Cervical cancer ([place])    Chronic pain    followed by pain clinic   Complication of anesthesia    post op urinary retention   COPD with asthma followed by pcp and as needed [facility]   hx exacerbation's  --- (11-29-2019  per last exacerbation >2 years;  last used rescue inhaler a week  ago)   DOE (dyspnea on exertion)    11-29-2019  per pt going flight of stairs but normal daily activites no sob   Family history of adverse reaction to anesthesia    mother -- ponv ;  per pt in 04/ 2021 father had cardiac arrest and Atrial fib intraoperative for circumcision (this was in [place]) put on venitator for two days then had nuclear stress test per pt showed disease but no cath done, still  has atrial fib;  she also stated prior to this he known cardiac disease with no intervention   GERD (gastroesophageal reflux disease)    Hemorrhoids    History of acute respiratory failure    06/ 2015  and 12/ 2016  CAP and Asthma exacerbation--  vented both times (ARDS)   History of anal dysplasia    AIN 2/ 3     History of cervical dysplasia    CIN 2 in 2000  History of chest pain    11-29-2019 per pt had cardiology evaluation for chest pain (but pt stated has not had any chest pain or cardiac symtpoms since) referred by pcp  w/ dr Johnsie Cancel note in epic 05-22-2019;  pt had nuclear stress test the showed normal perfusion no ischemia and ef >65% ,  also had echo same day showed normal w/ ef 65%   History of hypothyroidism    History of pneumonia    hx Required ventilatory support   History of TIA (transient ischemic attack)    Caused by medication reaction with combination of wellbutrin and estrogen   Migraines    OSA on CPAP    11-29-2019 pt stated uses every night   Type 2 diabetes mellitus ([place])    followed by pcp  (11-29-2019 pt stated does not check blood sugar)   Wears glasses       Past Surgical History:  Procedure Laterality Date   ABDOMINAL HYSTERECTOMY     CESAREAN SECTION  5374   CO2 LASER APPLICATION N/A 82/70/7867   Procedure: CO2 LASER APPLICATION;  Surgeon: Leighton Ruff, MD;  Location: [facility];  Service: General;  Laterality: N/A;   CONIZATION OF CERVIX  2000   DILATION AND CURETTAGE OF UTERUS     EVALUATION UNDER ANESTHESIA WITH  HEMORRHOIDECTOMY N/A 06/15/2014   Procedure: EXAM UNDER ANESTHESIA WITH HEMORRHOIDECTOMY;  Surgeon: Doreen Salvage, MD;  Location: [facility];  Service: General;  Laterality: N/A;   EXCISION OF SKIN TAG N/A 06/15/2014   Procedure: EXCISION OF SKIN TAGS;  Surgeon: Doreen Salvage, MD;  Location: [facility];  Service: General;  Laterality: N/A;   HIGH RESOLUTION ANOSCOPY N/A 10/11/2014   Procedure: HIGH RESOLUTION ANOSCOPY WITH BIOPSY;  Surgeon: Leighton Ruff, MD;  Location: [facility];  Service: General;  Laterality: N/A;   HYSTEROSCOPY WITH D & C  09/12/2007  &  2005   MASS EXCISION N/A 12/07/2019   Procedure: EXCISION OF ANAL POLYP AND PERIANAL MOLE;  Surgeon: Leighton Ruff, MD;  Location: [facility];  Service: General;  Laterality: N/A;   RECTAL EXAM UNDER ANESTHESIA N/A 10/11/2014   Procedure: ANAL EXAM UNDER ANESTHESIA;  Surgeon: Leighton Ruff, MD;  Location: [facility];  Service: General;  Laterality: N/A;   RECTAL EXAM UNDER ANESTHESIA N/A 12/18/2015   Procedure: ANAL EXAM UNDER ANESTHESIA  EXCISION ANAL MARGIN LESION;  Surgeon: Leighton Ruff, MD;  Location: [facility];  Service: General;  Laterality: N/A;   RECTAL EXAM UNDER ANESTHESIA N/A 12/07/2019   Procedure: ANAL EXAM UNDER ANESTHESIA;  Surgeon: Leighton Ruff, MD;  Location: [facility];  Service: General;  Laterality: N/A;   ROBOTIC ASSISTED TOTAL HYSTERECTOMY N/A 10/11/2012   Procedure: ROBOTIC ASSISTED TOTAL HYSTERECTOMY;  Surgeon: Lyman Speller, MD;  Location: [facility];  Service: Gynecology;  Laterality: N/A;   SALPINGOOPHORECTOMY Bilateral 10/11/2012   Procedure: SALPINGO OOPHORECTOMY;  Surgeon: Lyman Speller, MD;  Location: [facility];  Service: Gynecology;  Laterality: Bilateral;   TRANSTHORACIC ECHOCARDIOGRAM  05/07/2015   lvsf vigorous, ef 70-75%/  trivial MR and TR   VIDEO ASSISTED THORACOSCOPY (VATS)/THOROCOTOMY Right  11/04/2001   w/ Resection duplication esophageal cyst      Social History:      Social History   Tobacco Use   Smoking status: Every Day    Packs/day: 0.25    Years: 30.00    Total pack years: 7.50  Types: Cigarettes   Smokeless tobacco: Never  Substance Use Topics   Alcohol use: Not Currently    Alcohol/week: 0.0 standard drinks of alcohol    Comment: Rarely       Family History :     Family History  Problem Relation Age of Onset   Breast cancer Brother 47   Breast cancer Maternal Aunt    Depression Mother    Anesthesia problems Mother        post-op N/V   Alcohol abuse Father    Alcohol abuse Cousin    Neuro no   Home Medications:   Prior to Admission medications   Medication Sig Start Date End Date Taking? Authorizing Provider  albuterol (PROVENTIL HFA;VENTOLIN HFA) 108 (90 BASE) MCG/ACT inhaler Inhale 2 puffs into the lungs every 6 (six) hours as needed. wheezing 01/06/13  Yes Lysbeth Penner, FNP  albuterol (PROVENTIL) (5 MG/ML) 0.5% nebulizer solution Take 2.5 mg by nebulization every 6 (six) hours as needed for wheezing or shortness of breath.   Yes [provider]  ALPRAZolam Duanne Moron) 1 MG tablet Take 1 (one) tablet tablet by mouth two times daily as needed Patient taking differently: Take 1 mg by mouth 2 (two) times daily. 10/07/21  Yes   aspirin EC 81 MG tablet Take 81 mg by mouth daily.   Yes [provider]  Cholecalciferol (VITAMIN D) 50 MCG (2000 UT) tablet Take 1 (one) Tablet by mouth daily 10/29/20  Yes   famotidine (PEPCID) 20 MG tablet Take 20 mg by mouth at bedtime.    Yes [provider]  gabapentin (NEURONTIN) 100 MG capsule Take 2 capsules (200 mg total) by mouth 4 (four) times daily. 03/10/22  Yes   HYDROcodone-acetaminophen (NORCO) 10-325 MG tablet Take 1 tablet by mouth every 6 hours as needed for pain 03/10/22  Yes   levothyroxine (SYNTHROID) 50 MCG tablet Take 1 tablet by mouth daily 05/04/21  Yes   loratadine  (CLARITIN) 10 MG tablet Take 10 mg by mouth at bedtime.    Yes [provider]  metFORMIN (GLUCOPHAGE) 850 MG tablet Take 1 tablet by mouth two times daily 08/27/20  Yes   Multiple Vitamins-Calcium (ONE-A-DAY WOMENS FORMULA PO) Take by mouth daily.    Yes [provider]  Omega-3 Fatty Acids (OMEGA-3 FISH OIL PO) Take 720 mg by mouth in the morning and at bedtime.   Yes [provider]  pantoprazole (PROTONIX) 40 MG tablet Take 1 tablet by mouth daily. 05/04/21  Yes   polyethylene glycol (MIRALAX / GLYCOLAX) packet Take 17 g by mouth daily as needed for mild constipation.   Yes [provider]  rosuvastatin (CRESTOR) 20 MG tablet Take 1 table by mouth at bedtime 10/23/21  Yes   Semaglutide, 1 MG/DOSE, (OZEMPIC, 1 MG/DOSE,) 4 MG/3ML SOPN Inject 1  mL under skin once a week. 08/04/21  Yes   venlafaxine XR (EFFEXOR-XR) 150 MG 24 hr capsule Take 1 (one) Capsule by mouth daily 09/27/20  Yes   ALPRAZolam (XANAX) 1 MG tablet Take 1 tablet (1 mg total) by mouth 2 (two) times daily as needed. Patient not taking: Reported on 03/10/2022 01/13/22     ALPRAZolam (XANAX) 1 MG tablet Take by mouth. Patient not taking: Reported on 03/10/2022 06/14/17   [provider]  Ascorbic Acid (VITAMIN C) 1000 MG tablet Take 2 (two) Tablet by mouth daily Patient not taking: Reported on 03/10/2022 10/29/20     azithromycin (ZITHROMAX Z-PAK) 250 MG tablet Take  2 tablets by mouth on the first day then 1 tablet by mouth once daily until finished Patient not taking: Reported on 03/10/2022 10/07/21     lidocaine (XYLOCAINE) 2 % jelly Apply 1 application topically every 6 (six) hours as needed (for anal pain).  Patient not taking: Reported on 03/10/2022 01/22/17   [provider]  metFORMIN (GLUCOPHAGE) 850 MG tablet Take 1 tablet by mouth 2 times daily Patient not taking: Reported on 03/10/2022 10/23/21     metFORMIN (GLUCOPHAGE) 850 MG tablet Take 1 tablet by mouth 2 (two) times  daily. Patient not taking: Reported on 03/10/2022 05/21/20   [provider]  Semaglutide, 1 MG/DOSE, (OZEMPIC, 1 MG/DOSE,) 4 MG/3ML SOPN Inject 1 ml under the skin once weekly Patient not taking: Reported on 03/10/2022 09/07/21     Semaglutide, 1 MG/DOSE, (OZEMPIC, 1 MG/DOSE,) 4 MG/3ML SOPN Inject 1 (one) mg under the skin weekly Patient not taking: Reported on 03/10/2022 11/11/21     Semaglutide,0.25 or 0.'5MG'$ /DOS, (OZEMPIC, 0.25 OR 0.5 MG/DOSE,) 2 MG/3ML SOPN Inject 0.'5mg'$  under the skin once a week Patient not taking: Reported on 03/10/2022 03/04/21   Emelia Loron, NP  topiramate (TOPAMAX) 25 MG tablet Take 1 tablet  by mouth two times daily Patient not taking: Reported on 03/10/2022 06/03/21        Allergies:     Allergies  Allergen Reactions   Oseltamivir Phosphate Other (See Comments) and Cough    BRONCHOCONSTRICTION   Estrogens Other (See Comments)    SYMPTOMS OF A STROKE   Wellbutrin [Bupropion] Other (See Comments)    CAUSED TIA WHEN TAKEN WITH ESTROGEN   Ibuprofen Other (See Comments)    Avoids due to nosebleeds   Other     Bitrex, used in FIT testing    Morphine And Related Other (See Comments)    DOES NOT RELIEVE PAIN     Physical Exam:   Vitals  Blood pressure (!) 158/105, pulse 99, temperature 98.5 F (36.9 C), temperature source Axillary, resp. rate 12, height '5\' 4"'$  (1.626 m), weight 108.4 kg, SpO2 100 %.  1.  General: Appears in no acute distress  2. Psychiatric: Alert, oriented x3, intact insight and judgment  3. Neurologic: Cranial nerves II through XII grossly intact, no focal deficit noted  4. HEENMT:  Atraumatic normocephalic, extraocular muscles are intact  5. Respiratory : Scattered wheezing bilaterally  6. Cardiovascular : S1-S2, regular, no murmur auscultated  7. Gastrointestinal:  Abdomen is soft, nontender, no organomegaly  8. Skin:  No rashes noted  9.Musculoskeletal:  No edema in the lower extremities    Data Review:     CBC Recent Labs  Lab 03/10/22 1928  WBC 8.7  HGB 13.6  HCT 40.8  PLT 298  MCV 90.1  MCH 30.0  MCHC 33.3  RDW 14.3  LYMPHSABS 1.1  MONOABS 0.2  EOSABS 0.0  BASOSABS 0.0   ------------------------------------------------------------------------------------------------------------------  Results for orders placed or performed during the hospital encounter of 03/10/22 (from the past 48 hour(s))  Basic metabolic panel     Status: Abnormal   Collection Time: 03/10/22  7:28 PM  Result Value Ref Range   Sodium 139 135 - 145 mmol/L   Potassium 3.0 (L) 3.5 - 5.1 mmol/L   Chloride 106 98 - 111 mmol/L   CO2 19 (L) 22 - 32 mmol/L   Glucose, Bld 304 (H) 70 - 99 mg/dL    Comment: Glucose reference range applies only to samples taken after fasting for at least  8 hours.   BUN 8 6 - 20 mg/dL   Creatinine, Ser 0.79 0.44 - 1.00 mg/dL   Calcium 8.8 (L) 8.9 - 10.3 mg/dL   GFR, Estimated >60 >60 mL/min    Comment: (NOTE) Calculated using the CKD-EPI Creatinine Equation (2021)    Anion gap 14 5 - 15    Comment: Performed at [facility], [address] [place], [place] [address]  CBC with Differential/Platelet     Status: Abnormal   Collection Time: 03/10/22  7:28 PM  Result Value Ref Range   WBC 8.7 4.0 - 10.5 K/uL   RBC 4.53 3.87 - 5.11 MIL/uL   Hemoglobin 13.6 12.0 - 15.0 g/dL   HCT 40.8 36.0 - 46.0 %   MCV 90.1 80.0 - 100.0 fL   MCH 30.0 26.0 - 34.0 pg   MCHC 33.3 30.0 - 36.0 g/dL   RDW 14.3 11.5 - 15.5 %   Platelets 298 150 - 400 K/uL   nRBC 0.0 0.0 - 0.2 %   Neutrophils Relative % 82 %   Neutro Abs 7.2 1.7 - 7.7 K/uL   Lymphocytes Relative 13 %   Lymphs Abs 1.1 0.7 - 4.0 K/uL   Monocytes Relative 2 %   Monocytes Absolute 0.2 0.1 - 1.0 K/uL   Eosinophils Relative 0 %   Eosinophils Absolute 0.0 0.0 - 0.5 K/uL   Basophils Relative 1 %   Basophils Absolute 0.0 0.0 - 0.1 K/uL   Immature Granulocytes 2 %   Abs Immature Granulocytes 0.14 (H) 0.00 - 0.07 K/uL    Comment:  Performed at [facility], [address] [place], [place] [address]  Blood gas, venous     Status: Abnormal   Collection Time: 03/10/22  7:28 PM  Result Value Ref Range   pH, Ven 7.39 7.25 - 7.43   pCO2, Ven 34 (L) 44 - 60 mmHg   pO2, Ven 119 (H) 32 - 45 mmHg   Bicarbonate 20.6 20.0 - 28.0 mmol/L   Acid-base deficit 3.6 (H) 0.0 - 2.0 mmol/L   O2 Saturation 99.5 %   Patient temperature 36.7    Collection site BLOOD LEFT HAND    Drawn by [id number]     Comment: Performed at [facility], [address] [place], [place] [address]  SARS Coronavirus 2 by RT PCR (hospital order, performed in [facility] hospital lab) *cepheid single result test* Anterior Nasal Swab     Status: None   Collection Time: 03/10/22  7:34 PM   Specimen: Anterior Nasal Swab  Result Value Ref Range   SARS Coronavirus 2 by RT PCR NEGATIVE NEGATIVE    Comment: (NOTE) SARS-CoV-2 target nucleic acids are NOT DETECTED.  The SARS-CoV-2 RNA is generally detectable in upper and lower respiratory specimens during the acute phase of infection. The lowest concentration of SARS-CoV-2 viral copies this assay can detect is 250 copies / mL. A negative result does not preclude SARS-CoV-2 infection and should not be used as the sole basis for treatment or other patient management decisions.  A negative result may occur with improper specimen collection / handling, submission of specimen other than nasopharyngeal swab, presence of viral mutation(s) within the areas targeted by this assay, and inadequate number of viral copies (<250 copies / mL). A negative result must be combined with clinical observations, patient history, and epidemiological information.  Fact Sheet for Patients:   https://www.patel.info/  Fact Sheet for Healthcare Providers: https://hall.com/  This test is not yet approved or  cleared  by the [place] and has been authorized for detection and/or diagnosis of  SARS-CoV-2 by FDA under an Emergency Use Authorization (EUA).  This EUA will remain in effect (meaning this test can be used) for the duration of the COVID-19 declaration under Section 564(b)(1) of the Act, 21 U.S.C. section 360bbb-3(b)(1), unless the authorization is terminated or revoked sooner.  Performed at [facility], [address] [place], [place] [address]     Chemistries  Recent Labs  Lab 03/10/22 1928  NA 139  K 3.0*  CL 106  CO2 19*  GLUCOSE 304*  BUN 8  CREATININE 0.79  CALCIUM 8.8*   ------------------------------------------------------------------------------------------------------------------  ------------------------------------------------------------------------------------------------------------------ GFR: Estimated Creatinine Clearance: 102.3 mL/min (by C-G formula based on SCr of 0.79 mg/dL). Liver Function Tests: No results for input(s): "AST", "ALT", "ALKPHOS", "BILITOT", "PROT", "ALBUMIN" in the last 168 hours. No results for input(s): "LIPASE", "AMYLASE" in the last 168 hours. No results for input(s): "AMMONIA" in the last 168 hours. Coagulation Profile: No results for input(s): "INR", "PROTIME" in the last 168 hours. Cardiac Enzymes: No results for input(s): "CKTOTAL", "CKMB", "CKMBINDEX", "TROPONINI" in the last 168 hours. BNP (last 3 results) No results for input(s): "PROBNP" in the last 8760 hours. HbA1C: No results for input(s): "HGBA1C" in the last 72 hours. CBG: No results for input(s): "GLUCAP" in the last 168 hours. Lipid Profile: No results for input(s): "CHOL", "HDL", "LDLCALC", "TRIG", "CHOLHDL", "LDLDIRECT" in the last 72 hours. Thyroid Function Tests: No results for input(s): "TSH", "T4TOTAL", "FREET4", "T3FREE", "THYROIDAB" in the last 72 hours. Anemia Panel: No results for input(s): "VITAMINB12", "FOLATE", "FERRITIN", "TIBC", "IRON", "RETICCTPCT" in the last 72  hours.  --------------------------------------------------------------------------------------------------------------- Urine analysis:    Component Value Date/Time   COLORURINE STRAW (A) 03/23/2017 2141   APPEARANCEUR CLEAR 03/23/2017 2141   LABSPEC >1.046 (H) 03/23/2017 2141   PHURINE 6.0 03/23/2017 2141   GLUCOSEU NEGATIVE 03/23/2017 2141   HGBUR SMALL (A) 03/23/2017 2141   BILIRUBINUR NEGATIVE 03/23/2017 2141   BILIRUBINUR N 06/21/2015 1500   KETONESUR NEGATIVE 03/23/2017 2141   PROTEINUR NEGATIVE 03/23/2017 2141   UROBILINOGEN negative 06/21/2015 1500   UROBILINOGEN 0.2 06/18/2014 1138   NITRITE NEGATIVE 03/23/2017 2141   LEUKOCYTESUR NEGATIVE 03/23/2017 2141      Imaging Results:    DG Chest Port 1 View  Result Date: 03/10/2022 CLINICAL DATA:  Shortness of breath for several hours, initial encounter EXAM: PORTABLE CHEST 1 VIEW COMPARISON:  03/24/2017 FINDINGS: Cardiac shadow is stable. Mild vascular congestion is noted without interstitial edema. No focal infiltrate or effusion is noted. No acute bony abnormality is seen. IMPRESSION: Mild vascular congestion without edema. Electronically Signed   By: Inez Catalina M.D.   On: 03/10/2022 19:20    My personal review of EKG: Rhythm NSR, no ST changes   Assessment & Plan:    Principal Problem:   Acute hypoxemic respiratory failure (HCC)   Acute hypoxemic respiratory failure-secondary to asthma exacerbation.  Patient received Solu-Medrol 125 mg IV by EMS.  We will start Solu-Medrol 40 mg IV every 12 hours from tomorrow morning.  Start DuoNeb every 6 hours.  Continue BiPAP. Asthma exacerbation-as above, patient currently on BiPAP.  Started on DuoNebs as above, Solu-Medrol 40 mg IV every 12 hours. Hypokalemia-potassium is 3.0.  Replace potassium and follow BMP in am. Diabetes type 2-we will start resistant sliding scale insulin with NovoLog.  Start Lantus 10 units subcu daily.  Check CBG every 4 hours. History of anal  cancer-followed by colorectal surgery  as outpatient    DVT Prophylaxis-   Lovenox  AM Labs Ordered, also please review Full Orders  Family Communication: Admission, patients condition and plan of care including tests being ordered have been discussed with the patient and her mother at bedside who indicate understanding and agree with the plan and Code Status.  Code Status: Full code  Admission status: Observation  Time spent in minutes : 60 min   Mende Biswell S Izac Faulkenberry M.D

## 2022-03-10 NOTE — Progress Notes (Signed)
Patient placed back on Bipap.  Rate at 28, HR 105, 100% on 50%.  Pharmacy at bedside.

## 2022-03-11 ENCOUNTER — Other Ambulatory Visit (HOSPITAL_COMMUNITY): Payer: Self-pay

## 2022-03-11 NOTE — ED Notes (Addendum)
Pt requesting to leave AMA, this RN called for respiratory to assess pt. Dr. Josephine Cables made aware. RN explained risks of leaving AMA, pt verbalizing understanding.

## 2022-03-11 NOTE — Progress Notes (Signed)
Came in to patient's room, patient had Bipap off, appeared she had been sipping on water.  Audible wheeze could be heard, put in for Duoneb Q4 PRN.  Got patient to put Bipap back on and gave treatment through aerogen on machine.  Patient was complaining of CP.  Was asking how long it would be until she got a room.  I let patient know that I would let her RN know her concerns.  RN was in another room so related her concerns to charge RN.

## 2022-03-14 DIAGNOSIS — Z79891 Long term (current) use of opiate analgesic: Secondary | ICD-10-CM | POA: Diagnosis not present

## 2022-03-14 DIAGNOSIS — G893 Neoplasm related pain (acute) (chronic): Secondary | ICD-10-CM | POA: Diagnosis not present

## 2022-03-18 ENCOUNTER — Other Ambulatory Visit (HOSPITAL_COMMUNITY): Payer: Self-pay

## 2022-04-14 DIAGNOSIS — Z79891 Long term (current) use of opiate analgesic: Secondary | ICD-10-CM | POA: Diagnosis not present

## 2022-04-14 DIAGNOSIS — G893 Neoplasm related pain (acute) (chronic): Secondary | ICD-10-CM | POA: Diagnosis not present

## 2022-04-15 ENCOUNTER — Other Ambulatory Visit (HOSPITAL_COMMUNITY): Payer: Self-pay

## 2022-04-15 ENCOUNTER — Ambulatory Visit (HOSPITAL_BASED_OUTPATIENT_CLINIC_OR_DEPARTMENT_OTHER): Payer: 59 | Admitting: Obstetrics & Gynecology

## 2022-04-15 DIAGNOSIS — G893 Neoplasm related pain (acute) (chronic): Secondary | ICD-10-CM | POA: Diagnosis not present

## 2022-04-15 DIAGNOSIS — R7309 Other abnormal glucose: Secondary | ICD-10-CM | POA: Diagnosis not present

## 2022-04-15 DIAGNOSIS — R030 Elevated blood-pressure reading, without diagnosis of hypertension: Secondary | ICD-10-CM | POA: Diagnosis not present

## 2022-04-15 DIAGNOSIS — E1142 Type 2 diabetes mellitus with diabetic polyneuropathy: Secondary | ICD-10-CM | POA: Diagnosis not present

## 2022-04-15 DIAGNOSIS — Z79899 Other long term (current) drug therapy: Secondary | ICD-10-CM | POA: Diagnosis not present

## 2022-04-15 DIAGNOSIS — E1140 Type 2 diabetes mellitus with diabetic neuropathy, unspecified: Secondary | ICD-10-CM | POA: Diagnosis not present

## 2022-04-15 DIAGNOSIS — Z6839 Body mass index (BMI) 39.0-39.9, adult: Secondary | ICD-10-CM | POA: Diagnosis not present

## 2022-04-15 MED ORDER — HYDROCODONE-ACETAMINOPHEN 10-325 MG PO TABS
1.0000 | ORAL_TABLET | Freq: Four times a day (QID) | ORAL | 0 refills | Status: DC | PRN
  Filled 2022-04-15: qty 120, 30d supply, fill #0

## 2022-04-20 ENCOUNTER — Other Ambulatory Visit (HOSPITAL_COMMUNITY): Payer: Self-pay

## 2022-04-21 ENCOUNTER — Ambulatory Visit (HOSPITAL_BASED_OUTPATIENT_CLINIC_OR_DEPARTMENT_OTHER): Payer: 59 | Admitting: Obstetrics & Gynecology

## 2022-04-22 ENCOUNTER — Other Ambulatory Visit (HOSPITAL_COMMUNITY): Payer: Self-pay

## 2022-05-02 ENCOUNTER — Other Ambulatory Visit (HOSPITAL_COMMUNITY): Payer: Self-pay

## 2022-05-14 ENCOUNTER — Other Ambulatory Visit (HOSPITAL_COMMUNITY): Payer: Self-pay

## 2022-05-14 DIAGNOSIS — Z79891 Long term (current) use of opiate analgesic: Secondary | ICD-10-CM | POA: Diagnosis not present

## 2022-05-14 DIAGNOSIS — G893 Neoplasm related pain (acute) (chronic): Secondary | ICD-10-CM | POA: Diagnosis not present

## 2022-05-15 ENCOUNTER — Other Ambulatory Visit (HOSPITAL_COMMUNITY): Payer: Self-pay

## 2022-05-15 DIAGNOSIS — Z79899 Other long term (current) drug therapy: Secondary | ICD-10-CM | POA: Diagnosis not present

## 2022-05-15 DIAGNOSIS — Z6839 Body mass index (BMI) 39.0-39.9, adult: Secondary | ICD-10-CM | POA: Diagnosis not present

## 2022-05-15 DIAGNOSIS — G893 Neoplasm related pain (acute) (chronic): Secondary | ICD-10-CM | POA: Diagnosis not present

## 2022-05-15 DIAGNOSIS — R5383 Other fatigue: Secondary | ICD-10-CM | POA: Diagnosis not present

## 2022-05-15 DIAGNOSIS — E1142 Type 2 diabetes mellitus with diabetic polyneuropathy: Secondary | ICD-10-CM | POA: Diagnosis not present

## 2022-05-15 DIAGNOSIS — M129 Arthropathy, unspecified: Secondary | ICD-10-CM | POA: Diagnosis not present

## 2022-05-15 DIAGNOSIS — R7309 Other abnormal glucose: Secondary | ICD-10-CM | POA: Diagnosis not present

## 2022-05-15 DIAGNOSIS — E559 Vitamin D deficiency, unspecified: Secondary | ICD-10-CM | POA: Diagnosis not present

## 2022-05-15 DIAGNOSIS — R030 Elevated blood-pressure reading, without diagnosis of hypertension: Secondary | ICD-10-CM | POA: Diagnosis not present

## 2022-05-15 DIAGNOSIS — E1140 Type 2 diabetes mellitus with diabetic neuropathy, unspecified: Secondary | ICD-10-CM | POA: Diagnosis not present

## 2022-05-15 MED ORDER — ROSUVASTATIN CALCIUM 20 MG PO TABS
20.0000 mg | ORAL_TABLET | Freq: Every day | ORAL | 3 refills | Status: DC
  Filled 2022-05-15: qty 90, 90d supply, fill #0
  Filled 2022-08-12: qty 90, 90d supply, fill #1
  Filled 2022-12-29: qty 90, 90d supply, fill #2

## 2022-05-15 MED ORDER — LEVOTHYROXINE SODIUM 50 MCG PO TABS
50.0000 ug | ORAL_TABLET | Freq: Every day | ORAL | 3 refills | Status: DC
  Filled 2022-05-15: qty 90, 90d supply, fill #0
  Filled 2022-08-12: qty 90, 90d supply, fill #1
  Filled 2022-12-29: qty 90, 90d supply, fill #2

## 2022-05-15 MED ORDER — OZEMPIC (1 MG/DOSE) 4 MG/3ML ~~LOC~~ SOPN
1.0000 mg | PEN_INJECTOR | SUBCUTANEOUS | 5 refills | Status: DC
  Filled 2022-05-15: qty 3, 28d supply, fill #0
  Filled 2022-06-16: qty 3, 28d supply, fill #1

## 2022-05-15 MED ORDER — PANTOPRAZOLE SODIUM 40 MG PO TBEC
40.0000 mg | DELAYED_RELEASE_TABLET | Freq: Every day | ORAL | 3 refills | Status: DC
  Filled 2022-05-15: qty 90, 90d supply, fill #0
  Filled 2022-08-12: qty 90, 90d supply, fill #1
  Filled 2022-12-29: qty 90, 90d supply, fill #2

## 2022-05-15 MED ORDER — ALPRAZOLAM 1 MG PO TABS
1.0000 mg | ORAL_TABLET | Freq: Two times a day (BID) | ORAL | 0 refills | Status: DC | PRN
  Filled 2022-05-15 (×4): qty 60, 30d supply, fill #0

## 2022-05-15 MED ORDER — VENLAFAXINE HCL ER 150 MG PO CP24
150.0000 mg | ORAL_CAPSULE | Freq: Every day | ORAL | 3 refills | Status: DC
  Filled 2022-05-15: qty 90, 90d supply, fill #0
  Filled 2022-08-12: qty 90, 90d supply, fill #1

## 2022-05-15 MED ORDER — HYDROCODONE-ACETAMINOPHEN 10-325 MG PO TABS
1.0000 | ORAL_TABLET | Freq: Four times a day (QID) | ORAL | 0 refills | Status: DC | PRN
  Filled 2022-08-04: qty 120, 30d supply, fill #0

## 2022-05-15 MED ORDER — METFORMIN HCL 850 MG PO TABS
850.0000 mg | ORAL_TABLET | Freq: Two times a day (BID) | ORAL | 3 refills | Status: DC
  Filled 2022-05-15: qty 180, 90d supply, fill #0
  Filled 2022-08-12: qty 180, 90d supply, fill #1

## 2022-05-19 ENCOUNTER — Other Ambulatory Visit: Payer: Self-pay

## 2022-06-08 ENCOUNTER — Other Ambulatory Visit (HOSPITAL_COMMUNITY): Payer: Self-pay

## 2022-06-09 ENCOUNTER — Other Ambulatory Visit (HOSPITAL_COMMUNITY): Payer: Self-pay

## 2022-06-09 ENCOUNTER — Encounter (HOSPITAL_BASED_OUTPATIENT_CLINIC_OR_DEPARTMENT_OTHER): Payer: Self-pay | Admitting: Obstetrics & Gynecology

## 2022-06-09 ENCOUNTER — Other Ambulatory Visit: Payer: Self-pay

## 2022-06-09 ENCOUNTER — Ambulatory Visit (INDEPENDENT_AMBULATORY_CARE_PROVIDER_SITE_OTHER): Payer: Commercial Managed Care - PPO | Admitting: Obstetrics & Gynecology

## 2022-06-09 ENCOUNTER — Other Ambulatory Visit (HOSPITAL_COMMUNITY)
Admission: RE | Admit: 2022-06-09 | Discharge: 2022-06-09 | Disposition: A | Discharge status: Home / Self Care | Payer: Commercial Managed Care - PPO | Source: Ambulatory Visit | Attending: Obstetrics & Gynecology | Admitting: Obstetrics & Gynecology

## 2022-06-09 VITALS — BP 124/69 | HR 68 | Ht 64.0 in | Wt 231.4 lb

## 2022-06-09 DIAGNOSIS — Z124 Encounter for screening for malignant neoplasm of cervix: Secondary | ICD-10-CM

## 2022-06-09 DIAGNOSIS — F17200 Nicotine dependence, unspecified, uncomplicated: Secondary | ICD-10-CM | POA: Diagnosis not present

## 2022-06-09 DIAGNOSIS — D013 Carcinoma in situ of anus and anal canal: Secondary | ICD-10-CM | POA: Diagnosis not present

## 2022-06-09 DIAGNOSIS — Z01419 Encounter for gynecological examination (general) (routine) without abnormal findings: Secondary | ICD-10-CM

## 2022-06-09 DIAGNOSIS — B372 Candidiasis of skin and nail: Secondary | ICD-10-CM | POA: Diagnosis not present

## 2022-06-09 DIAGNOSIS — E119 Type 2 diabetes mellitus without complications: Secondary | ICD-10-CM

## 2022-06-09 DIAGNOSIS — A630 Anogenital (venereal) warts: Secondary | ICD-10-CM | POA: Diagnosis not present

## 2022-06-09 MED ORDER — NYSTATIN 100000 UNIT/GM EX CREA
1.0000 | TOPICAL_CREAM | Freq: Two times a day (BID) | CUTANEOUS | 0 refills | Status: DC
  Filled 2022-06-09: qty 30, 15d supply, fill #0

## 2022-06-09 MED ORDER — NYSTATIN 100000 UNIT/GM EX POWD
1.0000 | Freq: Two times a day (BID) | CUTANEOUS | 2 refills | Status: DC
  Filled 2022-06-09: qty 15, 7d supply, fill #0

## 2022-06-09 MED ORDER — HYDROCODONE-ACETAMINOPHEN 10-325 MG PO TABS
1.0000 | ORAL_TABLET | Freq: Four times a day (QID) | ORAL | 0 refills | Status: DC | PRN
  Filled 2022-06-09 – 2022-07-07 (×2): qty 120, 30d supply, fill #0

## 2022-06-09 NOTE — Progress Notes (Signed)
52 y.o. G16P1001 Divorced White or Caucasian female here for annual exam.  Lots of stressors with parents.  Father is in [place].  Planning on helping moving her mother back to [place] so both parents are in the same region.   Denies vaginal bleeding.  Together with Aaron Edelman.  Has now been 4 years.    Discussed colon cancer screening with pt.  Referral was placed last year.  Not having any rectal bleeding now.  Has too much going on in her life right now for this.    No LMP recorded. Patient has had a hysterectomy.          Sexually active: Yes.    The current method of family planning is status post hysterectomy.    Smoker:  yes  Health Maintenance: Pap:  03/24/2019 Negative History of abnormal Pap:  yes MMG:  08/06/2021 Negative Colonoscopy:  referral to placed last year.   Screening Labs: done with Dr. Nancy Fetter   reports that she has been smoking cigarettes. She has a 7.50 pack-year smoking history. She has never used smokeless tobacco. She reports that she does not currently use alcohol. She reports that she does not use drugs.  Past Medical History:  Diagnosis Date   Anal cancer ([place])    Anal polyp    Anxiety    Arthritis    Right hip   Bladder cancer ([facility])    Cervical cancer ([place])    Chronic pain    followed by pain clinic   Complication of anesthesia    post op urinary retention   COPD with asthma followed by pcp and as needed [facility]   hx exacerbation's  --- (11-29-2019  per last exacerbation >2 years;  last used rescue inhaler a week ago)   DOE (dyspnea on exertion)    11-29-2019  per pt going flight of stairs but normal daily activites no sob   Family history of adverse reaction to anesthesia    mother -- ponv ;  per pt in 04/ 2021 father had cardiac arrest and Atrial fib intraoperative for circumcision (this was in [place]) put on venitator for two days then had nuclear stress test per pt showed disease but no cath done, still  has atrial fib;  she  also stated prior to this he known cardiac disease with no intervention   GERD (gastroesophageal reflux disease)    Hemorrhoids    History of acute respiratory failure    06/ 2015  and 12/ 2016  CAP and Asthma exacerbation--  vented both times (ARDS)   History of anal dysplasia    AIN 2/ 3     History of cervical dysplasia    CIN 2 in 2000   History of chest pain    11-29-2019 per pt had cardiology evaluation for chest pain (but pt stated has not had any chest pain or cardiac symtpoms since) referred by pcp  w/ dr Johnsie Cancel note in epic 05-22-2019;  pt had nuclear stress test the showed normal perfusion no ischemia and ef >65% ,  also had echo same day showed normal w/ ef 65%   History of hypothyroidism    History of pneumonia    hx Required ventilatory support   History of TIA (transient ischemic attack)    Caused by medication reaction with combination of wellbutrin and estrogen   Migraines    OSA on CPAP    11-29-2019 pt stated uses every night   Type 2 diabetes mellitus (  [place])    followed by pcp  (11-29-2019 pt stated does not check blood sugar)   Wears glasses     Past Surgical History:  Procedure Laterality Date   ABDOMINAL HYSTERECTOMY     CESAREAN SECTION  4332   CO2 LASER APPLICATION N/A 95/18/8416   Procedure: CO2 LASER APPLICATION;  Surgeon: Leighton Ruff, MD;  Location: [facility];  Service: General;  Laterality: N/A;   CONIZATION OF CERVIX  2000   DILATION AND CURETTAGE OF UTERUS     EVALUATION UNDER ANESTHESIA WITH HEMORRHOIDECTOMY N/A 06/15/2014   Procedure: EXAM UNDER ANESTHESIA WITH HEMORRHOIDECTOMY;  Surgeon: Doreen Salvage, MD;  Location: [facility];  Service: General;  Laterality: N/A;   EXCISION OF SKIN TAG N/A 06/15/2014   Procedure: EXCISION OF SKIN TAGS;  Surgeon: Doreen Salvage, MD;  Location: [facility];  Service: General;  Laterality: N/A;   HIGH RESOLUTION ANOSCOPY N/A 10/11/2014   Procedure: HIGH RESOLUTION ANOSCOPY  WITH BIOPSY;  Surgeon: Leighton Ruff, MD;  Location: [facility];  Service: General;  Laterality: N/A;   HYSTEROSCOPY WITH D & C  09/12/2007  &  2005   MASS EXCISION N/A 12/07/2019   Procedure: EXCISION OF ANAL POLYP AND PERIANAL MOLE;  Surgeon: Leighton Ruff, MD;  Location: [facility];  Service: General;  Laterality: N/A;   RECTAL EXAM UNDER ANESTHESIA N/A 10/11/2014   Procedure: ANAL EXAM UNDER ANESTHESIA;  Surgeon: Leighton Ruff, MD;  Location: [facility];  Service: General;  Laterality: N/A;   RECTAL EXAM UNDER ANESTHESIA N/A 12/18/2015   Procedure: ANAL EXAM UNDER ANESTHESIA  EXCISION ANAL MARGIN LESION;  Surgeon: Leighton Ruff, MD;  Location: [facility];  Service: General;  Laterality: N/A;   RECTAL EXAM UNDER ANESTHESIA N/A 12/07/2019   Procedure: ANAL EXAM UNDER ANESTHESIA;  Surgeon: Leighton Ruff, MD;  Location: [facility];  Service: General;  Laterality: N/A;   ROBOTIC ASSISTED TOTAL HYSTERECTOMY N/A 10/11/2012   Procedure: ROBOTIC ASSISTED TOTAL HYSTERECTOMY;  Surgeon: [place] Speller, MD;  Location: [facility];  Service: Gynecology;  Laterality: N/A;   SALPINGOOPHORECTOMY Bilateral 10/11/2012   Procedure: SALPINGO OOPHORECTOMY;  Surgeon: [place] Speller, MD;  Location: [facility];  Service: Gynecology;  Laterality: Bilateral;   TRANSTHORACIC ECHOCARDIOGRAM  05/07/2015   lvsf vigorous, ef 70-75%/  trivial MR and TR   VIDEO ASSISTED THORACOSCOPY (VATS)/THOROCOTOMY Right 11/04/2001   w/ Resection duplication esophageal cyst    Current Outpatient Medications  Medication Sig Dispense Refill   albuterol (PROVENTIL HFA;VENTOLIN HFA) 108 (90 BASE) MCG/ACT inhaler Inhale 2 puffs into the lungs every 6 (six) hours as needed. wheezing 1 Inhaler 11   ALPRAZolam (XANAX) 1 MG tablet Take 1 tablet (1 mg total) by mouth 2 (two) times daily as needed. 60 tablet 0   Ascorbic Acid (VITAMIN C) 1000 MG tablet Take 2 (two)  Tablet by mouth daily 20 tablet 0   aspirin EC 81 MG tablet Take 81 mg by mouth daily.     Cholecalciferol (VITAMIN D) 50 MCG (2000 UT) tablet Take 1 (one) Tablet by mouth daily 10 tablet 0   famotidine (PEPCID) 20 MG tablet Take 20 mg by mouth at bedtime.      gabapentin (NEURONTIN) 100 MG capsule Take 2 capsules (200 mg total) by mouth 4 (four) times daily. 240 capsule 5   HYDROcodone-acetaminophen (NORCO) 10-325 MG tablet Take 1 tablet by mouth every 6 (six) hours as needed for pain.  120 tablet 0   levothyroxine (SYNTHROID) 50 MCG tablet Take 1 tablet (50 mcg total) by mouth daily. 90 tablet 3   loratadine (CLARITIN) 10 MG tablet Take 10 mg by mouth at bedtime.      metFORMIN (GLUCOPHAGE) 850 MG tablet Take 1 tablet (850 mg total) by mouth 2 (two) times daily. 180 tablet 3   Multiple Vitamins-Calcium (ONE-A-DAY WOMENS FORMULA PO) Take by mouth daily.      Omega-3 Fatty Acids (OMEGA-3 FISH OIL PO) Take 720 mg by mouth in the morning and at bedtime.     pantoprazole (PROTONIX) 40 MG tablet Take 1 tablet (40 mg total) by mouth daily. 90 tablet 3   rosuvastatin (CRESTOR) 20 MG tablet Take 1 tablet (20 mg total) by mouth at bedtime. 90 tablet 3   Semaglutide, 1 MG/DOSE, (OZEMPIC, 1 MG/DOSE,) 4 MG/3ML SOPN Inject 1 mg into the skin every 7 (seven) days. 3 mL 5   venlafaxine XR (EFFEXOR-XR) 150 MG 24 hr capsule Take 1 capsule (150 mg total) by mouth daily. 90 capsule 3   No current facility-administered medications for this visit.    Family History  Problem Relation Age of Onset   Breast cancer Brother 29   Breast cancer Maternal Aunt    Depression Mother    Anesthesia problems Mother        post-op N/V   Alcohol abuse Father    Alcohol abuse Cousin     ROS: Constitutional: negative Genitourinary:negative  Exam:   BP 124/69 (BP Location: Left Arm, Patient Position: Sitting, Cuff Size: Large)   Pulse 68   Ht '5\' 4"'$  (1.626 m) Comment: Reported  Wt 231 lb 6.4 oz (105 kg)   BMI 39.72  kg/m   Height: '5\' 4"'$  (162.6 cm) (Reported)  General appearance: alert, cooperative and appears stated age Head: Normocephalic, without obvious abnormality, atraumatic Neck: no adenopathy, supple, symmetrical, trachea midline and thyroid normal to inspection and palpation Lungs: clear to auscultation bilaterally Breasts: normal appearance, no masses or tenderness Heart: regular rate and rhythm Abdomen: soft, non-tender; bowel sounds normal; no masses,  no organomegaly Extremities: extremities normal, atraumatic, no cyanosis or edema Skin: Skin color, texture, turgor normal. No rashes or lesions Lymph nodes: Cervical, supraclavicular, and axillary nodes normal. No abnormal inguinal nodes palpated Neurologic: Grossly normal   Pelvic: External genitalia:  no lesions              Urethra:  normal appearing urethra with no masses, tenderness or lesions              Bartholins and Skenes: normal                 Vagina: normal appearing vagina with normal color and no discharge, no lesions              Cervix: absent              Pap taken: Yes.   Bimanual Exam:  Uterus:  uterus absent              Adnexa: no mass, fullness, tenderness               Rectovaginal: Confirms               Anus:  normal sphincter tone, pea sized nodule at 9 o'clock that is firm and tender to palpation  Chaperone, Octaviano Batty, CMA, was present for exam.  Assessment/Plan: 1. Well woman exam with routine gynecological exam - Pap  smear and HR HPV obtained today - Mammogram 07/2021 - Colonoscopy referral placed last year.  Declined this year.  Cologuard discussed as well. - lab work done with PCP, Dr. Nancy Fetter - vaccines reviewed/updated  2. Cervical cancer screening - Cytology - PAP( [facility])  3. AIN grade III - followed by Dr. Marcello Moores. Last appt I can see in [facility] 12/09/20 - with lesion noted today, highly encouraged pt to follow up with Dr. Marcello Moores.  Communicated with Dr. Marcello Moores regarding this as well.  4.  Anogenital warts - h/o  5. Type 2 diabetes mellitus without complication, without long-term current use of insulin (H[facility]) - follow by Dr. Nancy Fetter  6. Smoking  7. Candida infection of flexural skin - nystatin (MYCOSTATIN/NYSTOP) powder; Apply 1 Application topically 2 (two) times daily. Apply to affected area for up to 7 days  Dispense: 15 g; Refill: 2 - nystatin cream (MYCOSTATIN); Apply 1 Application topically 2 (two) times daily. Apply nightly as needed for treatment of candida  Dispense: 30 g; Refill: 0

## 2022-06-10 ENCOUNTER — Other Ambulatory Visit (HOSPITAL_COMMUNITY): Payer: Self-pay

## 2022-06-12 ENCOUNTER — Other Ambulatory Visit (HOSPITAL_COMMUNITY): Payer: Self-pay

## 2022-06-15 ENCOUNTER — Other Ambulatory Visit (HOSPITAL_COMMUNITY): Payer: Self-pay

## 2022-06-16 ENCOUNTER — Other Ambulatory Visit: Payer: Self-pay

## 2022-06-16 ENCOUNTER — Other Ambulatory Visit (HOSPITAL_COMMUNITY): Payer: Self-pay

## 2022-06-16 LAB — CYTOLOGY - PAP
Comment: NEGATIVE
Diagnosis: NEGATIVE
High risk HPV: NEGATIVE

## 2022-06-16 MED ORDER — HYDROCODONE-ACETAMINOPHEN 5-325 MG PO TABS
1.0000 | ORAL_TABLET | Freq: Four times a day (QID) | ORAL | 0 refills | Status: DC | PRN
  Filled 2022-06-16: qty 120, 30d supply, fill #0

## 2022-06-23 ENCOUNTER — Other Ambulatory Visit: Payer: Self-pay

## 2022-07-07 ENCOUNTER — Ambulatory Visit: Payer: Commercial Managed Care - PPO | Admitting: Nurse Practitioner

## 2022-07-07 ENCOUNTER — Other Ambulatory Visit (HOSPITAL_COMMUNITY): Payer: Self-pay

## 2022-07-07 ENCOUNTER — Encounter: Payer: Self-pay | Admitting: Nurse Practitioner

## 2022-07-07 VITALS — BP 128/82 | HR 100 | Ht 66.0 in | Wt 233.8 lb

## 2022-07-07 DIAGNOSIS — E1142 Type 2 diabetes mellitus with diabetic polyneuropathy: Secondary | ICD-10-CM

## 2022-07-07 DIAGNOSIS — E039 Hypothyroidism, unspecified: Secondary | ICD-10-CM | POA: Diagnosis not present

## 2022-07-07 DIAGNOSIS — F3342 Major depressive disorder, recurrent, in full remission: Secondary | ICD-10-CM

## 2022-07-07 DIAGNOSIS — F4310 Post-traumatic stress disorder, unspecified: Secondary | ICD-10-CM | POA: Diagnosis not present

## 2022-07-07 DIAGNOSIS — I509 Heart failure, unspecified: Secondary | ICD-10-CM | POA: Diagnosis not present

## 2022-07-07 DIAGNOSIS — D013 Carcinoma in situ of anus and anal canal: Secondary | ICD-10-CM | POA: Diagnosis not present

## 2022-07-07 DIAGNOSIS — K219 Gastro-esophageal reflux disease without esophagitis: Secondary | ICD-10-CM | POA: Diagnosis not present

## 2022-07-07 DIAGNOSIS — G893 Neoplasm related pain (acute) (chronic): Secondary | ICD-10-CM | POA: Diagnosis not present

## 2022-07-07 DIAGNOSIS — I25709 Atherosclerosis of coronary artery bypass graft(s), unspecified, with unspecified angina pectoris: Secondary | ICD-10-CM | POA: Insufficient documentation

## 2022-07-07 DIAGNOSIS — J4550 Severe persistent asthma, uncomplicated: Secondary | ICD-10-CM | POA: Diagnosis not present

## 2022-07-07 DIAGNOSIS — J449 Chronic obstructive pulmonary disease, unspecified: Secondary | ICD-10-CM

## 2022-07-07 HISTORY — DX: Heart failure, unspecified: I50.9

## 2022-07-07 MED ORDER — ONDANSETRON HCL 4 MG PO TABS
4.0000 mg | ORAL_TABLET | Freq: Three times a day (TID) | ORAL | 2 refills | Status: DC | PRN
  Filled 2022-07-07: qty 30, 10d supply, fill #0
  Filled 2022-08-24: qty 30, 10d supply, fill #1

## 2022-07-07 NOTE — Assessment & Plan Note (Signed)
Chronic copd with overlapping asthma with current symptoms of wheezing present in the right middle lobe. No respiratory distress is present. Discussion with patient today about utilization of medication options greater than albuterol for management, however, Denise Ray is unable to use steroid inhalers due to severe candidal infections in mouth and throat historically with use. At this time recommend very close monitoring and quick reaction for any new or worsening respiratory symptoms. Follow-up closely with pulmonology. Denise Ray does have a history positive for hospitalization with intubation twice, therefore, this is a concern for repeat occurrence. Will plan to continue to monitor closely. No refills needed at this time.

## 2022-07-07 NOTE — Assessment & Plan Note (Signed)
Chronic and stable at this time. She does have PRN ativan for management of symptoms. We will monitor closely.

## 2022-07-07 NOTE — Assessment & Plan Note (Signed)
Currently well controlled on her current regimen with no alarm symptoms present. She does not need refills at this time. Will continue to monitor.

## 2022-07-07 NOTE — Assessment & Plan Note (Signed)
Chronic. Last labs with her previous PCP in December. We will plan to request these records today for evaluation. No symptoms currently. Will plan to repeat labs in May. She will follow-up sooner if she has new or worsening symptoms.

## 2022-07-07 NOTE — Assessment & Plan Note (Signed)
Chronic. She is not monitoring her blood sugars at home. Control at this time is unclear. We will obtain labs from her previous PCP for monitoring. We discussed restarting Ozempic today at the 0.18m dose and slowly titrating. She has stopped the medication in the past due to GI symptoms, but is interested in restarting again. Sample provided today of 0.2844mand 0.44m63mose. We will plan to follow up in May to monitor labs.

## 2022-07-07 NOTE — Assessment & Plan Note (Signed)
History of bladder, cervical, uterine, and anal cancer with chronic pain. She has historically been managed by her previous pcp with opiate pain medications daily. She has consistently passed UDS and PDMP screening. She is in need of someone to help manage her medication since her PCP left the practice. We discussed the option of pain management with her today. For now, I will plan to take over the management and monitor closely. We will plan 3 month follow-ups with UDS and PDMP review. She is in agreement to this.

## 2022-07-07 NOTE — Patient Instructions (Addendum)
We will plan to continue your current medications. Let me know when you need refills.   We will plan to follow-up in May/June and get labs.

## 2022-07-07 NOTE — Assessment & Plan Note (Signed)
Chronic anal cancer related to HPV with no current treatment options. She is having as needed surgical removal of lesions. Recent GYN visit did show the presence of firm nodularity in the rectum on bimanual exam. She has an appointment to see her surgeon for evaluation of this. At this time she is asymptomatic. We will monitor closely.

## 2022-07-07 NOTE — Progress Notes (Signed)
Orma Render, DNP, AGNP-c [facility] Care [facility] Sports Medicine [address] [place], [place] [address] Main Office [phone]   New patient visit   Patient: Denise Ray   DOB: 10-12-1970   52 y.o. Female  MRN: [id number] Visit Date: 07/07/2022  Patient Care Team: Orma Render, NP as PCP - General (Nurse Practitioner)  Today's Vitals   07/07/22 1546  BP: 128/82  Pulse: 100  Weight: 233 lb 12.8 oz (106.1 kg)  Height: 5' 6"$  (1.676 m)   Body mass index is 37.74 kg/m.   Today's healthcare provider: Orma Render, NP   Chief Complaint  Patient presents with   other    New pt. Est. And med refills,    Subjective    Denise Ray is a 52 y.o. female who presents today as a new patient to establish care.    Patient endorses the following history: Followed by Leighton Ruff at [facility] for anal cancer.  Previous PCP managed chronic pain medication for cancer pain.  History reviewed and reveals the following: Past Medical History:  Diagnosis Date   Anal cancer ([facility])    Anal polyp    Anxiety    Arthritis    Right hip   Bladder cancer ([place])    Bradycardia 05/07/2015   Cervical cancer (H[facility])    Chronic congestive heart failure, unspecified heart failure type ([place]) 07/07/2022   Chronic pain    followed by pain clinic   Complication of anesthesia    post op urinary retention   COPD with asthma followed by pcp and as needed [facility]   hx exacerbation's  --- (11-29-2019  per last exacerbation >2 years;  last used rescue inhaler a week ago)   DOE (dyspnea on exertion)    11-29-2019  per pt going flight of stairs but normal daily activites no sob   Family history of adverse reaction to anesthesia    mother -- ponv ;  per pt in 04/ 2021 father had cardiac arrest and Atrial fib intraoperative for circumcision (this was in [place]) put on venitator for two days then had nuclear stress test per pt showed disease but no cath done, still  has atrial fib;  she also  stated prior to this he known cardiac disease with no intervention   Former smoker 03/24/2017   GERD (gastroesophageal reflux disease)    Hemorrhoids    History of acute respiratory failure    06/ 2015  and 12/ 2016  CAP and Asthma exacerbation--  vented both times (ARDS)   History of anal dysplasia    AIN 2/ 3     History of cervical dysplasia    CIN 2 in 2000   History of chest pain    11-29-2019 per pt had cardiology evaluation for chest pain (but pt stated has not had any chest pain or cardiac symtpoms since) referred by pcp  w/ dr Johnsie Cancel note in epic 05-22-2019;  pt had nuclear stress test the showed normal perfusion no ischemia and ef >65% ,  also had echo same day showed normal w/ ef 65%   History of hypothyroidism    History of pneumonia    hx Required ventilatory support   History of respiratory failure 10/31/2013   History of TIA (transient ischemic attack)    Caused by medication reaction with combination of wellbutrin and estrogen   Migraines    OSA on CPAP    11-29-2019 pt stated uses every night  Type 2 diabetes mellitus ([place])    followed by pcp  (11-29-2019 pt stated does not check blood sugar)   Wears glasses    Past Surgical History:  Procedure Laterality Date   ABDOMINAL HYSTERECTOMY     CESAREAN SECTION  [id number]   CO2 LASER APPLICATION N/A [id number]   Procedure: CO2 LASER APPLICATION;  Surgeon: Leighton Ruff, MD;  Location: [facility];  Service: General;  Laterality: N/A;   CONIZATION OF CERVIX  2000   DILATION AND CURETTAGE OF UTERUS     EVALUATION UNDER ANESTHESIA WITH HEMORRHOIDECTOMY N/A 06/15/2014   Procedure: EXAM UNDER ANESTHESIA WITH HEMORRHOIDECTOMY;  Surgeon: Doreen Salvage, MD;  Location: [facility];  Service: General;  Laterality: N/A;   EXCISION OF SKIN TAG N/A 06/15/2014   Procedure: EXCISION OF SKIN TAGS;  Surgeon: Doreen Salvage, MD;  Location: [facility];  Service: General;  Laterality: N/A;   HIGH RESOLUTION  ANOSCOPY N/A 10/11/2014   Procedure: HIGH RESOLUTION ANOSCOPY WITH BIOPSY;  Surgeon: Leighton Ruff, MD;  Location: [facility];  Service: General;  Laterality: N/A;   HYSTEROSCOPY WITH D & C  09/12/2007  &  2005   MASS EXCISION N/A 12/07/2019   Procedure: EXCISION OF ANAL POLYP AND PERIANAL MOLE;  Surgeon: Leighton Ruff, MD;  Location: [facility];  Service: General;  Laterality: N/A;   RECTAL EXAM UNDER ANESTHESIA N/A 10/11/2014   Procedure: ANAL EXAM UNDER ANESTHESIA;  Surgeon: Leighton Ruff, MD;  Location: [facility];  Service: General;  Laterality: N/A;   RECTAL EXAM UNDER ANESTHESIA N/A 12/18/2015   Procedure: ANAL EXAM UNDER ANESTHESIA  EXCISION ANAL MARGIN LESION;  Surgeon: Leighton Ruff, MD;  Location: [facility];  Service: General;  Laterality: N/A;   RECTAL EXAM UNDER ANESTHESIA N/A 12/07/2019   Procedure: ANAL EXAM UNDER ANESTHESIA;  Surgeon: Leighton Ruff, MD;  Location: [facility];  Service: General;  Laterality: N/A;   ROBOTIC ASSISTED TOTAL HYSTERECTOMY N/A 10/11/2012   Procedure: ROBOTIC ASSISTED TOTAL HYSTERECTOMY;  Surgeon: Lyman Speller, MD;  Location: [facility];  Service: Gynecology;  Laterality: N/A;   SALPINGOOPHORECTOMY Bilateral 10/11/2012   Procedure: SALPINGO OOPHORECTOMY;  Surgeon: Lyman Speller, MD;  Location: [facility];  Service: Gynecology;  Laterality: Bilateral;   TRANSTHORACIC ECHOCARDIOGRAM  05/07/2015   lvsf vigorous, ef 70-75%/  trivial MR and TR   VIDEO ASSISTED THORACOSCOPY (VATS)/THOROCOTOMY Right 11/04/2001   w/ Resection duplication esophageal cyst   Family Status  Relation Name Status   Brother  Ecologist  (Not Specified)   Mother  Alive   Father  Alive   Cousin  (Not Specified)   Family History  Problem Relation Age of Onset   Breast cancer Brother 55   Breast cancer Maternal Aunt    Depression Mother    Anesthesia problems Mother        post-op N/V    Alcohol abuse Father    Alcohol abuse Cousin    Social History   Socioeconomic History   Marital status: Divorced    Spouse name: Not on file   Number of children: 1   Years of education: Not on file   Highest education level: Not on file  Occupational History   Occupation: Statistician    Employer: [employer]  Tobacco Use   Smoking status: Every Day    Packs/day: 1.00    Years: 30.00    Total  pack years: 30.00    Types: Cigarettes   Smokeless tobacco: Never  Vaping Use   Vaping Use: Never used  Substance and Sexual Activity   Alcohol use: Not Currently    Alcohol/week: 0.0 standard drinks of alcohol    Comment: Rarely   Drug use: Never   Sexual activity: Yes    Partners: Male    Birth control/protection: Surgical    Comment: Hysterectomy  Other Topics Concern   Not on file  Social History Narrative   Divorced.  Mother and son live with her.   Social Determinants of Health   Financial Resource Strain: Not on file  Food Insecurity: Not on file  Transportation Needs: Not on file  Physical Activity: Not on file  Stress: Not on file  Social Connections: Not on file   Outpatient Medications Prior to Visit  Medication Sig   albuterol (PROVENTIL HFA;VENTOLIN HFA) 108 (90 BASE) MCG/ACT inhaler Inhale 2 puffs into the lungs every 6 (six) hours as needed. wheezing   ALPRAZolam (XANAX) 1 MG tablet Take 1 tablet (1 mg total) by mouth 2 (two) times daily as needed.   Ascorbic Acid (VITAMIN C) 1000 MG tablet Take 2 (two) Tablet by mouth daily   aspirin EC 81 MG tablet Take 81 mg by mouth daily.   Cholecalciferol (VITAMIN D) 50 MCG (2000 UT) tablet Take 1 (one) Tablet by mouth daily   famotidine (PEPCID) 20 MG tablet Take 20 mg by mouth at bedtime.    gabapentin (NEURONTIN) 100 MG capsule Take 2 capsules (200 mg total) by mouth 4 (four) times daily.   HYDROcodone-acetaminophen (NORCO) 10-325 MG tablet Take 1 tablet by mouth every 6 (six) hours as  needed for pain.   HYDROcodone-acetaminophen (NORCO) 10-325 MG tablet Take 1 tablet by mouth every 6 (six) hours as needed for pain.   levothyroxine (SYNTHROID) 50 MCG tablet Take 1 tablet (50 mcg total) by mouth daily.   loratadine (CLARITIN) 10 MG tablet Take 10 mg by mouth at bedtime.    metFORMIN (GLUCOPHAGE) 850 MG tablet Take 1 tablet (850 mg total) by mouth 2 (two) times daily.   Multiple Vitamins-Calcium (ONE-A-DAY WOMENS FORMULA PO) Take by mouth daily.    nystatin (MYCOSTATIN/NYSTOP) powder Apply 1 Application topically 2 (two) times daily. Apply to affected area for up to 7 days   nystatin cream (MYCOSTATIN) Apply topically 2 (two) times daily. Apply nightly as needed for treatment of candida   Omega-3 Fatty Acids (OMEGA-3 FISH OIL PO) Take 720 mg by mouth in the morning and at bedtime.   pantoprazole (PROTONIX) 40 MG tablet Take 1 tablet (40 mg total) by mouth daily.   rosuvastatin (CRESTOR) 20 MG tablet Take 1 tablet (20 mg total) by mouth at bedtime.   Semaglutide, 1 MG/DOSE, (OZEMPIC, 1 MG/DOSE,) 4 MG/3ML SOPN Inject 1 mg into the skin every 7 (seven) days.   venlafaxine XR (EFFEXOR-XR) 150 MG 24 hr capsule Take 1 capsule (150 mg total) by mouth daily.   HYDROcodone-acetaminophen (NORCO/VICODIN) 5-325 MG tablet Take 1 tablet by mouth every 6 (six) hours as needed for pain (Patient not taking: Reported on 07/07/2022)   No facility-administered medications prior to visit.   Allergies  Allergen Reactions   Oseltamivir Phosphate Other (See Comments) and Cough    BRONCHOCONSTRICTION   Estrogens Other (See Comments)    SYMPTOMS OF A STROKE   Wellbutrin [Bupropion] Other (See Comments)    CAUSED TIA WHEN TAKEN WITH ESTROGEN   Ibuprofen Other (See Comments)  Avoids due to nosebleeds   Other     Bitrex, used in FIT testing    Morphine And Related Other (See Comments)    DOES NOT RELIEVE PAIN   Immunization History  Administered Date(s) Administered   Influenza Split  03/18/2013   Influenza,inj,Quad PF,6+ Mos 02/16/2016, 03/02/2017   Influenza-Unspecified 01/30/2021, 02/15/2022   PFIZER(Purple Top)SARS-COV-2 Vaccination 05/26/2019, 06/16/2019    Health Maintenance Due Health Maintenance Topics with due status: Overdue     Topic Date Due   FOOT EXAM Never done   OPHTHALMOLOGY EXAM Never done   Diabetic kidney evaluation - Urine ACR Never done   Hepatitis C Screening Never done   DTaP/Tdap/Td Never done   COLONOSCOPY (Pts 45-29yr Insurance coverage will need to be confirmed) Never done   COVID-19 Vaccine 07/14/2019    Review of Systems All review of systems negative    Objective    BP 128/82   Pulse 100   Ht 5' 6"$  (1.676 m)   Wt 233 lb 12.8 oz (106.1 kg)   BMI 37.74 kg/m  Physical Exam Vitals and nursing note reviewed.  Constitutional:      Appearance: Normal appearance. She is obese.  HENT:     Head: Normocephalic.  Eyes:     Pupils: Pupils are equal, round, and reactive to light.  Cardiovascular:     Rate and Rhythm: Normal rate and regular rhythm.     Pulses: Normal pulses.     Heart sounds: Normal heart sounds.  Pulmonary:     Effort: Pulmonary effort is normal.     Breath sounds: Wheezing present. No rhonchi.  Abdominal:     General: Bowel sounds are normal. There is no distension.     Palpations: Abdomen is soft.     Tenderness: There is no abdominal tenderness. There is no guarding.  Musculoskeletal:        General: Normal range of motion.     Cervical back: Normal range of motion.  Skin:    General: Skin is warm and dry.     Capillary Refill: Capillary refill takes less than 2 seconds.  Neurological:     General: No focal deficit present.     Mental Status: She is alert and oriented to person, place, and time.  Psychiatric:        Mood and Affect: Mood normal.     No results found for any visits on 07/07/22.  Assessment & Plan      Problem List Items Addressed This Visit     PTSD (post-traumatic stress  disorder)    Chronic and stable at this time. She does have PRN ativan for management of symptoms. We will monitor closely.       Depression, major, recurrent ([place]    Currently well controlled on her current regimen with no alarm symptoms present. She does not need refills at this time. Will continue to monitor.       Acquired hypothyroidism - Primary    Chronic. Last labs with her previous PCP in December. We will plan to request these records today for evaluation. No symptoms currently. Will plan to repeat labs in May. She will follow-up sooner if she has new or worsening symptoms.       Diabetes ([place]    Chronic. She is not monitoring her blood sugars at home. Control at this time is unclear. We will obtain labs from her previous PCP for monitoring. We discussed restarting Ozempic today at the 0.285mdose and slowly  titrating. She has stopped the medication in the past due to GI symptoms, but is interested in restarting again. Sample provided today of 0.25m and 0.561mdose. We will plan to follow up in May to monitor labs.       Relevant Medications   ondansetron (ZOFRAN) 4 MG tablet   AIN grade III    Chronic anal cancer related to HPV with no current treatment options. She is having as needed surgical removal of lesions. Recent GYN visit did show the presence of firm nodularity in the rectum on bimanual exam. She has an appointment to see her surgeon for evaluation of this. At this time she is asymptomatic. We will monitor closely.       Chronic obstructive pulmonary disease ([facility])    Chronic copd with overlapping asthma with current symptoms of wheezing present in the right middle lobe. No respiratory distress is present. Discussion with patient today about utilization of medication options greater than albuterol for management, however, she is unable to use steroid inhalers due to severe candidal infections in mouth and throat historically with use. At this time recommend very close  monitoring and quick reaction for any new or worsening respiratory symptoms. Follow-up closely with pulmonology. She does have a history positive for hospitalization with intubation twice, therefore, this is a concern for repeat occurrence. Will plan to continue to monitor closely. No refills needed at this time.       RESOLVED: Chronic congestive heart failure, unspecified heart failure type (H[facility])   Chronic neoplasm-related pain    History of bladder, cervical, uterine, and anal cancer with chronic pain. She has historically been managed by her previous pcp with opiate pain medications daily. She has consistently passed UDS and PDMP screening. She is in need of someone to help manage her medication since her PCP left the practice. We discussed the option of pain management with her today. For now, I will plan to take over the management and monitor closely. We will plan 3 month follow-ups with UDS and PDMP review. She is in agreement to this.       Gastroesophageal reflux disease   Relevant Medications   ondansetron (ZOFRAN) 4 MG tablet   Other Visit Diagnoses     Severe persistent asthma without complication            Return for May/June Med Management.      Kenny Stern, SaCoralee PesaNP, DNP, AGNP-C [facility]

## 2022-07-08 ENCOUNTER — Other Ambulatory Visit (HOSPITAL_COMMUNITY): Payer: Self-pay

## 2022-07-08 ENCOUNTER — Other Ambulatory Visit: Payer: Self-pay

## 2022-07-13 ENCOUNTER — Ambulatory Visit: Payer: Self-pay | Admitting: General Surgery

## 2022-07-13 DIAGNOSIS — A630 Anogenital (venereal) warts: Secondary | ICD-10-CM | POA: Diagnosis not present

## 2022-07-13 NOTE — H&P (Signed)
PROVIDER:  Monico Blitz, MD   MRN: [id number] DOB: Feb 14, 1971 DATE OF ENCOUNTER: 07/13/2022 Subjective    Chief Complaint: No chief complaint on file.       History of Present Illness: Denise Ray is a 52 y.o. female who is seen today for anal lesions. Patient underwent hemorrhoidectomy and removal of anal condyloma in 2016.  Her pathology showed AIN 2 and 3 within the biopsy specimens.  We followed this area due to positive margin.  She also has a h/o abnormal pap smears but the last few have been normal per pt.  In 2017 she noticed that the right anterior lesion has become more bothersome and larger on exam.  She was taken back to the OR for excision of this lesion in early Aug 2017.  Final pathology revealed negative margins, but once again pathology showed AIN 3.  She used Aldara after that.  She was seen in my office and noted to have a pedunculated mass in her anal canal.  I recommended exam under anesthesia and excision.  She had excision of a right posterior perianal polyp in July 2021.  Patient did well with surgery.   Final pathology showed anal condyloma in the anal canal mass and the mole was positive for squamous cell carcinoma in situ.  She has had no further issues since then.  She is here today for follow-up after Dr Sabra Heck noted a new anal lesion.     Past Medical History:  Diagnosis Date   Anal cancer ([place])    Anal polyp    Anxiety    Arthritis    Right hip   Bladder cancer ([place])    Bradycardia 05/07/2015   Cervical cancer ([place])    Chronic congestive heart failure, unspecified heart failure type ([place]) 07/07/2022   Chronic pain    followed by pain clinic   Complication of anesthesia    post op urinary retention   COPD with asthma followed by pcp and as needed [facility]   hx exacerbation's  --- (11-29-2019  per last exacerbation >2 years;  last used rescue inhaler a week ago)   DOE (dyspnea on exertion)    11-29-2019  per pt going flight of  stairs but normal daily activites no sob   Family history of adverse reaction to anesthesia    mother -- ponv ;  per pt in 04/ 2021 father had cardiac arrest and Atrial fib intraoperative for circumcision (this was in [place]) put on venitator for two days then had nuclear stress test per pt showed disease but no cath done, still  has atrial fib;  she also stated prior to this he known cardiac disease with no intervention   Former smoker 03/24/2017   GERD (gastroesophageal reflux disease)    Hemorrhoids    History of acute respiratory failure    06/ 2015  and 12/ 2016  CAP and Asthma exacerbation--  vented both times (AR[facility])   History of anal dysplasia    AIN 2/ 3     History of cervical dysplasia    CIN 2 in 2000   History of chest pain    11-29-2019 per pt had cardiology evaluation for chest pain (but pt stated has not had any chest pain or cardiac symtpoms since) referred by pcp  w/ dr Johnsie Cancel note in epic 05-22-2019;  pt had nuclear stress test the showed normal perfusion no ischemia and ef >65% ,  also had echo same day showed normal  w/ ef 65%   History of hypothyroidism    History of pneumonia    hx Required ventilatory support   History of respiratory failure 10/31/2013   History of TIA (transient ischemic attack)    Caused by medication reaction with combination of wellbutrin and estrogen   Migraines    OSA on CPAP    11-29-2019 pt stated uses every night   Type 2 diabetes mellitus ([place])    followed by pcp  (11-29-2019 pt stated does not check blood sugar)   Wears glasses    Past Surgical History:  Procedure Laterality Date   ABDOMINAL HYSTERECTOMY     CESAREAN SECTION  [id number]   CO2 LASER APPLICATION N/A [id number]   Procedure: CO2 LASER APPLICATION;  Surgeon: Leighton Ruff, MD;  Location: [facility];  Service: General;  Laterality: N/A;   CONIZATION OF CERVIX  2000   DILATION AND CURETTAGE OF UTERUS     EVALUATION UNDER ANESTHESIA WITH HEMORRHOIDECTOMY  N/A 06/15/2014   Procedure: EXAM UNDER ANESTHESIA WITH HEMORRHOIDECTOMY;  Surgeon: Doreen Salvage, MD;  Location: [facility];  Service: General;  Laterality: N/A;   EXCISION OF SKIN TAG N/A 06/15/2014   Procedure: EXCISION OF SKIN TAGS;  Surgeon: Doreen Salvage, MD;  Location: [facility];  Service: General;  Laterality: N/A;   HIGH RESOLUTION ANOSCOPY N/A 10/11/2014   Procedure: HIGH RESOLUTION ANOSCOPY WITH BIOPSY;  Surgeon: Leighton Ruff, MD;  Location: [facility];  Service: General;  Laterality: N/A;   HYSTEROSCOPY WITH D & C  09/12/2007  &  2005   MASS EXCISION N/A 12/07/2019   Procedure: EXCISION OF ANAL POLYP AND PERIANAL MOLE;  Surgeon: Leighton Ruff, MD;  Location: [facility];  Service: General;  Laterality: N/A;   RECTAL EXAM UNDER ANESTHESIA N/A 10/11/2014   Procedure: ANAL EXAM UNDER ANESTHESIA;  Surgeon: Leighton Ruff, MD;  Location: [facility];  Service: General;  Laterality: N/A;   RECTAL EXAM UNDER ANESTHESIA N/A 12/18/2015   Procedure: ANAL EXAM UNDER ANESTHESIA  EXCISION ANAL MARGIN LESION;  Surgeon: Leighton Ruff, MD;  Location: [facility];  Service: General;  Laterality: N/A;   RECTAL EXAM UNDER ANESTHESIA N/A 12/07/2019   Procedure: ANAL EXAM UNDER ANESTHESIA;  Surgeon: Leighton Ruff, MD;  Location: [facility];  Service: General;  Laterality: N/A;   ROBOTIC ASSISTED TOTAL HYSTERECTOMY N/A 10/11/2012   Procedure: ROBOTIC ASSISTED TOTAL HYSTERECTOMY;  Surgeon: Lyman Speller, MD;  Location: [facility];  Service: Gynecology;  Laterality: N/A;   SALPINGOOPHORECTOMY Bilateral 10/11/2012   Procedure: SALPINGO OOPHORECTOMY;  Surgeon: Lyman Speller, MD;  Location: [facility];  Service: Gynecology;  Laterality: Bilateral;   TRANSTHORACIC ECHOCARDIOGRAM  05/07/2015   lvsf vigorous, ef 70-75%/  trivial MR and TR   VIDEO ASSISTED THORACOSCOPY (VATS)/THOROCOTOMY Right 11/04/2001   w/  Resection duplication esophageal cyst  . Social History   Socioeconomic History   Marital status: Divorced    Spouse name: Not on file   Number of children: 1   Years of education: Not on file   Highest education level: Not on file  Occupational History   Occupation: Respiratory Therapist    Employer: [employer]  Tobacco Use   Smoking status: Every Day    Packs/day: 1.00    Years: 30.00    Total pack years: 30.00    Types: Cigarettes   Smokeless tobacco: Never  Vaping Use   Vaping Use: Never  used  Substance and Sexual Activity   Alcohol use: Not Currently    Alcohol/week: 0.0 standard drinks of alcohol    Comment: Rarely   Drug use: Never   Sexual activity: Yes    Partners: Male    Birth control/protection: Surgical    Comment: Hysterectomy  Other Topics Concern   Not on file  Social History Narrative   Divorced.  Mother and son live with her.   Social Determinants of Health   Financial Resource Strain: Not on file  Food Insecurity: Not on file  Transportation Needs: Not on file  Physical Activity: Not on file  Stress: Not on file  Social Connections: Not on file  Intimate Partner Violence: Not on file   Family History  Problem Relation Age of Onset   Breast cancer Brother 37   Breast cancer Maternal Aunt    Depression Mother    Anesthesia problems Mother        post-op N/V   Alcohol abuse Father    Alcohol abuse Cousin     Current Outpatient Medications:    albuterol (PROVENTIL HFA;VENTOLIN HFA) 108 (90 BASE) MCG/ACT inhaler, Inhale 2 puffs into the lungs every 6 (six) hours as needed. wheezing, Disp: 1 Inhaler, Rfl: 11   ALPRAZolam (XANAX) 1 MG tablet, Take 1 tablet (1 mg total) by mouth 2 (two) times daily as needed., Disp: 60 tablet, Rfl: 0   Ascorbic Acid (VITAMIN C) 1000 MG tablet, Take 2 (two) Tablet by mouth daily, Disp: 20 tablet, Rfl: 0   aspirin EC 81 MG tablet, Take 81 mg by mouth daily., Disp: , Rfl:    Cholecalciferol (VITAMIN  D) 50 MCG (2000 UT) tablet, Take 1 (one) Tablet by mouth daily, Disp: 10 tablet, Rfl: 0   famotidine (PEPCID) 20 MG tablet, Take 20 mg by mouth at bedtime. , Disp: , Rfl:    gabapentin (NEURONTIN) 100 MG capsule, Take 2 capsules (200 mg total) by mouth 4 (four) times daily., Disp: 240 capsule, Rfl: 5   HYDROcodone-acetaminophen (NORCO) 10-325 MG tablet, Take 1 tablet by mouth every 6 (six) hours as needed for pain., Disp: 120 tablet, Rfl: 0   HYDROcodone-acetaminophen (NORCO) 10-325 MG tablet, Take 1 tablet by mouth every 6 (six) hours as needed for pain., Disp: 120 tablet, Rfl: 0   HYDROcodone-acetaminophen (NORCO/VICODIN) 5-325 MG tablet, Take 1 tablet by mouth every 6 (six) hours as needed for pain (Patient not taking: Reported on 07/07/2022), Disp: 120 tablet, Rfl: 0   levothyroxine (SYNTHROID) 50 MCG tablet, Take 1 tablet (50 mcg total) by mouth daily., Disp: 90 tablet, Rfl: 3   loratadine (CLARITIN) 10 MG tablet, Take 10 mg by mouth at bedtime. , Disp: , Rfl:    metFORMIN (GLUCOPHAGE) 850 MG tablet, Take 1 tablet (850 mg total) by mouth 2 (two) times daily., Disp: 180 tablet, Rfl: 3   Multiple Vitamins-Calcium (ONE-A-DAY WOMENS FORMULA PO), Take by mouth daily. , Disp: , Rfl:    nystatin (MYCOSTATIN/NYSTOP) powder, Apply 1 Application topically 2 (two) times daily. Apply to affected area for up to 7 days, Disp: 15 g, Rfl: 2   nystatin cream (MYCOSTATIN), Apply topically 2 (two) times daily. Apply nightly as needed for treatment of candida, Disp: 30 g, Rfl: 0   Omega-3 Fatty Acids (OMEGA-3 FISH OIL PO), Take 720 mg by mouth in the morning and at bedtime., Disp: , Rfl:    ondansetron (ZOFRAN) 4 MG tablet, Take 1 tablet (4 mg total) by mouth every 8 (eight)  hours as needed for nausea or vomiting., Disp: 30 tablet, Rfl: 2   pantoprazole (PROTONIX) 40 MG tablet, Take 1 tablet (40 mg total) by mouth daily., Disp: 90 tablet, Rfl: 3   rosuvastatin (CRESTOR) 20 MG tablet, Take 1 tablet (20 mg total) by  mouth at bedtime., Disp: 90 tablet, Rfl: 3   Semaglutide, 1 MG/DOSE, (OZEMPIC, 1 MG/DOSE,) 4 MG/3ML SOPN, Inject 1 mg into the skin every 7 (seven) days., Disp: 3 mL, Rfl: 5   venlafaxine XR (EFFEXOR-XR) 150 MG 24 hr capsule, Take 1 capsule (150 mg total) by mouth daily., Disp: 90 capsule, Rfl: 3 Allergies  Allergen Reactions   Oseltamivir Phosphate Other (See Comments) and Cough    BRONCHOCONSTRICTION   Estrogens Other (See Comments)    SYMPTOMS OF A STROKE   Wellbutrin [Bupropion] Other (See Comments)    CAUSED TIA WHEN TAKEN WITH ESTROGEN   Ibuprofen Other (See Comments)    Avoids due to nosebleeds   Other     Bitrex, used in FIT testing    Morphine And Related Other (See Comments)    DOES NOT RELIEVE PAIN   Review of Systems - Negative except as stated above       Objective:         Vitals:    07/13/22 1003  Pulse: 92  Temp: 37.1 C (98.7 F)  SpO2: 97%  Weight: (!) 105.3 kg (232 lb 3.2 oz)  Height: 165.1 cm ('5\' 5"'$ )  PainSc:   2        General appearance - alert, well appearing, and in no distress   Rectal: anal condyloma anterior           Assessment and Plan:  Diagnoses and all orders for this visit:   Anal condyloma      repeat exam in 3 months given symptoms   52 year old female with history of multiple AIN grade 3 lesions perianally.  She also had a AIN grade 3 pigmented lesion.  On exam today there was a moderate-sized anal condyloma in the anterior anal canal.  No other lesions were palpated.  I recommended operative exam in the OR with possible biopsy and excision of the anal condyloma.  Patient has a history of urinary retention after surgery and will most likely need a Foley catheter upon discharge after surgery.  She can remove this herself at home 2 days after surgery.   No follow-ups on file.       Rosario Adie, MD Colon and Rectal Surgery [facility]

## 2022-07-15 ENCOUNTER — Other Ambulatory Visit (HOSPITAL_COMMUNITY): Payer: Self-pay

## 2022-07-29 ENCOUNTER — Encounter: Payer: Self-pay | Admitting: Nurse Practitioner

## 2022-07-29 DIAGNOSIS — F4310 Post-traumatic stress disorder, unspecified: Secondary | ICD-10-CM

## 2022-08-04 ENCOUNTER — Other Ambulatory Visit (HOSPITAL_COMMUNITY): Payer: Self-pay

## 2022-08-11 ENCOUNTER — Other Ambulatory Visit (HOSPITAL_COMMUNITY): Payer: Self-pay

## 2022-08-12 ENCOUNTER — Other Ambulatory Visit (HOSPITAL_COMMUNITY): Payer: Self-pay

## 2022-08-12 MED ORDER — ALPRAZOLAM 1 MG PO TABS
1.0000 mg | ORAL_TABLET | Freq: Two times a day (BID) | ORAL | 2 refills | Status: DC | PRN
  Filled 2022-08-12: qty 60, 30d supply, fill #0
  Filled 2022-09-10: qty 60, 30d supply, fill #1
  Filled 2022-10-16 – 2022-10-26 (×2): qty 60, 30d supply, fill #2

## 2022-08-13 ENCOUNTER — Other Ambulatory Visit: Payer: Self-pay

## 2022-08-24 ENCOUNTER — Other Ambulatory Visit (HOSPITAL_COMMUNITY): Payer: Self-pay

## 2022-09-01 ENCOUNTER — Other Ambulatory Visit (HOSPITAL_COMMUNITY): Payer: Self-pay

## 2022-09-03 ENCOUNTER — Other Ambulatory Visit (HOSPITAL_COMMUNITY): Payer: Self-pay

## 2022-09-04 MED ORDER — HYDROCODONE-ACETAMINOPHEN 5-325 MG PO TABS: 1.0000 | ORAL_TABLET | Freq: Four times a day (QID) | ORAL | 0 refills | Status: DC | PRN

## 2022-09-04 MED ORDER — HYDROCODONE-ACETAMINOPHEN 10-325 MG PO TABS
1.0000 | ORAL_TABLET | Freq: Four times a day (QID) | ORAL | 0 refills | Status: DC | PRN
  Filled 2022-09-04: qty 120, 30d supply, fill #0

## 2022-09-04 MED ORDER — HYDROCODONE-ACETAMINOPHEN 10-325 MG PO TABS: 1.0000 | ORAL_TABLET | Freq: Four times a day (QID) | ORAL | 0 refills | Status: DC | PRN

## 2022-09-04 NOTE — Addendum Note (Signed)
Addended by: Debrina Kizer, [place] Dec E on: 09/04/2022 06:31 PM   Modules accepted: Orders

## 2022-09-05 ENCOUNTER — Other Ambulatory Visit (HOSPITAL_COMMUNITY): Payer: Self-pay

## 2022-09-07 ENCOUNTER — Other Ambulatory Visit: Payer: Self-pay

## 2022-09-10 ENCOUNTER — Other Ambulatory Visit: Payer: Self-pay

## 2022-09-10 ENCOUNTER — Other Ambulatory Visit (HOSPITAL_COMMUNITY): Payer: Self-pay

## 2022-10-01 ENCOUNTER — Encounter (HOSPITAL_BASED_OUTPATIENT_CLINIC_OR_DEPARTMENT_OTHER): Payer: Self-pay | Admitting: General Surgery

## 2022-10-05 ENCOUNTER — Encounter (HOSPITAL_BASED_OUTPATIENT_CLINIC_OR_DEPARTMENT_OTHER): Payer: Self-pay | Admitting: General Surgery

## 2022-10-05 NOTE — Progress Notes (Addendum)
Spoke w/ via phone for pre-op interview--- pt Lab needs dos----  Mirant results------ current EKG in epic/ chart COVID test -----patient states asymptomatic no test needed Arrive at ------- 0830 on 10-14-2022 NPO after MN NO Solid Food.  Clear liquids from MN until--- 0730 Med rec completed Medications to take morning of surgery ----- synthroid, protonix, gabapentin, norco/ xanax if needed, do not take asa morning of surgery Diabetic medication ----- do not take metformin morning of surgery Patient instructed no nail polish to be worn day of surgery Patient instructed to bring photo id and insurance card day of surgery Patient aware to have Driver (ride ) / caregiver    for 24 hours after surgery -- mother, Koleen Nimrod Patient Special Instructions ----- asked to bring rescue inhaler dos Pre-Op special Instructions ----- n/a Patient verbalized understanding of instructions that were given at this phone interview. Patient denies shortness of breath, chest pain, fever, cough at this phone interview.   Anesthesia Review: COPD w/ asthma (still smokes 1 ppd) last used rescue inhaler 3 wks ago);  DM2;  OSA s/ cpap (usually uses nightly, however lately due to seasonal allergies has not used every night);   post-op urinary retention  (per Dr Maisie Fus H&P in epic she plans for pt to d/c with foley catheter  PCP:  Enid Skeens NP (loc 07-07-2022) Cardiologist : evaluation visit by Dr Eden Emms 05-22-2019 in epic Chest x-ray : 03-10-2022 EKG : 03-10-2022 Echo : 01-12-20201 Stress test: nuclear 05-30-2019 Cardiac Cath : no Activity level:  sob w/ stair but her normal activities denies sob Sleep Study/ CPAP : yes / yes Fasting Blood Sugar :      / Checks Blood Sugar -- times a day:  dose not check  Blood Thinner/ Instructions /Last Dose: no ASA / Instructions/ Last Dose : asa 81mg /  per pt dr Maisie Fus was not having her to stop prior to surgery

## 2022-10-14 ENCOUNTER — Other Ambulatory Visit (HOSPITAL_COMMUNITY): Payer: Self-pay

## 2022-10-14 ENCOUNTER — Other Ambulatory Visit: Payer: Self-pay

## 2022-10-14 ENCOUNTER — Ambulatory Visit (HOSPITAL_BASED_OUTPATIENT_CLINIC_OR_DEPARTMENT_OTHER)
Admission: RE | Admit: 2022-10-14 | Discharge: 2022-10-14 | Disposition: A | Discharge status: Home / Self Care | Payer: Commercial Managed Care - PPO | Attending: General Surgery | Admitting: General Surgery

## 2022-10-14 ENCOUNTER — Ambulatory Visit (HOSPITAL_BASED_OUTPATIENT_CLINIC_OR_DEPARTMENT_OTHER): Payer: Commercial Managed Care - PPO | Admitting: Certified Registered Nurse Anesthetist

## 2022-10-14 ENCOUNTER — Encounter (HOSPITAL_BASED_OUTPATIENT_CLINIC_OR_DEPARTMENT_OTHER)
Admission: RE | Disposition: A | Discharge status: Home / Self Care | Payer: Self-pay | Source: Home / Self Care | Attending: General Surgery

## 2022-10-14 ENCOUNTER — Encounter (HOSPITAL_BASED_OUTPATIENT_CLINIC_OR_DEPARTMENT_OTHER): Payer: Self-pay | Admitting: General Surgery

## 2022-10-14 DIAGNOSIS — Z85048 Personal history of other malignant neoplasm of rectum, rectosigmoid junction, and anus: Secondary | ICD-10-CM | POA: Insufficient documentation

## 2022-10-14 DIAGNOSIS — E119 Type 2 diabetes mellitus without complications: Secondary | ICD-10-CM | POA: Insufficient documentation

## 2022-10-14 DIAGNOSIS — I2510 Atherosclerotic heart disease of native coronary artery without angina pectoris: Secondary | ICD-10-CM

## 2022-10-14 DIAGNOSIS — A630 Anogenital (venereal) warts: Secondary | ICD-10-CM | POA: Diagnosis not present

## 2022-10-14 DIAGNOSIS — K219 Gastro-esophageal reflux disease without esophagitis: Secondary | ICD-10-CM | POA: Insufficient documentation

## 2022-10-14 DIAGNOSIS — E039 Hypothyroidism, unspecified: Secondary | ICD-10-CM | POA: Diagnosis not present

## 2022-10-14 DIAGNOSIS — G4733 Obstructive sleep apnea (adult) (pediatric): Secondary | ICD-10-CM | POA: Diagnosis not present

## 2022-10-14 DIAGNOSIS — D013 Carcinoma in situ of anus and anal canal: Secondary | ICD-10-CM | POA: Diagnosis not present

## 2022-10-14 DIAGNOSIS — F17210 Nicotine dependence, cigarettes, uncomplicated: Secondary | ICD-10-CM

## 2022-10-14 DIAGNOSIS — Z79899 Other long term (current) drug therapy: Secondary | ICD-10-CM | POA: Insufficient documentation

## 2022-10-14 DIAGNOSIS — J449 Chronic obstructive pulmonary disease, unspecified: Secondary | ICD-10-CM | POA: Diagnosis not present

## 2022-10-14 DIAGNOSIS — Z01818 Encounter for other preprocedural examination: Secondary | ICD-10-CM

## 2022-10-14 DIAGNOSIS — F17200 Nicotine dependence, unspecified, uncomplicated: Secondary | ICD-10-CM | POA: Diagnosis not present

## 2022-10-14 DIAGNOSIS — Z7984 Long term (current) use of oral hypoglycemic drugs: Secondary | ICD-10-CM | POA: Insufficient documentation

## 2022-10-14 DIAGNOSIS — J4489 Other specified chronic obstructive pulmonary disease: Secondary | ICD-10-CM | POA: Insufficient documentation

## 2022-10-14 HISTORY — DX: Major depressive disorder, single episode, unspecified: F32.9

## 2022-10-14 HISTORY — DX: Unspecified osteoarthritis, unspecified site: M19.90

## 2022-10-14 HISTORY — DX: Other intervertebral disc degeneration, lumbosacral region without mention of lumbar back pain or lower extremity pain: M51.379

## 2022-10-14 HISTORY — DX: Polyneuropathy, unspecified: G62.9

## 2022-10-14 HISTORY — DX: Generalized anxiety disorder: F41.1

## 2022-10-14 HISTORY — DX: Herpesviral infection, unspecified: B00.9

## 2022-10-14 HISTORY — DX: Other intervertebral disc degeneration, lumbosacral region: M51.37

## 2022-10-14 HISTORY — DX: Personal history of other infectious and parasitic diseases: Z86.19

## 2022-10-14 HISTORY — PX: CONDYLOMA EXCISION/FULGURATION: SHX1389

## 2022-10-14 LAB — POCT I-STAT, CHEM 8
BUN: 8 mg/dL (ref 6–20)
Calcium, Ion: 1.25 mmol/L (ref 1.15–1.40)
Chloride: 102 mmol/L (ref 98–111)
Creatinine, Ser: 0.5 mg/dL (ref 0.44–1.00)
Glucose, Bld: 141 mg/dL — ABNORMAL HIGH (ref 70–99)
HCT: 43 % (ref 36.0–46.0)
Hemoglobin: 14.6 g/dL (ref 12.0–15.0)
Potassium: 3.9 mmol/L (ref 3.5–5.1)
Sodium: 139 mmol/L (ref 135–145)
TCO2: 26 mmol/L (ref 22–32)

## 2022-10-14 LAB — GLUCOSE, CAPILLARY: Glucose-Capillary: 123 mg/dL — ABNORMAL HIGH (ref 70–99)

## 2022-10-14 SURGERY — EXAM UNDER ANESTHESIA
Anesthesia: General | Site: Rectum

## 2022-10-14 MED ORDER — OXYCODONE HCL 5 MG/5ML PO SOLN: 5.0000 mg | Freq: Once | ORAL | Status: DC | PRN

## 2022-10-14 MED ORDER — ACETIC ACID 5 % SOLN
Status: DC | PRN
  Administered 2022-10-14: 1 via TOPICAL

## 2022-10-14 MED ORDER — HYDROCODONE-ACETAMINOPHEN 10-325 MG PO TABS: 1.0000 | ORAL_TABLET | Freq: Four times a day (QID) | ORAL | 0 refills | Status: DC | PRN

## 2022-10-14 MED ORDER — OXYCODONE HCL 5 MG PO TABS: 5.0000 mg | ORAL_TABLET | Freq: Four times a day (QID) | ORAL | 0 refills | Status: DC | PRN

## 2022-10-14 MED ORDER — PROPOFOL 500 MG/50ML IV EMUL
INTRAVENOUS | Status: DC | PRN
  Administered 2022-10-14: 180 ug/kg/min via INTRAVENOUS

## 2022-10-14 MED ORDER — PROPOFOL 10 MG/ML IV BOLUS
INTRAVENOUS | Status: DC | PRN
  Administered 2022-10-14: 20 mg via INTRAVENOUS

## 2022-10-14 MED ORDER — LACTATED RINGERS IV SOLN: INTRAVENOUS | Status: DC

## 2022-10-14 MED ORDER — HYDROCODONE-ACETAMINOPHEN 10-325 MG PO TABS: 1.0000 | ORAL_TABLET | ORAL | 0 refills | Status: DC | PRN

## 2022-10-14 MED ORDER — DEXMEDETOMIDINE HCL IN NACL 80 MCG/20ML IV SOLN
INTRAVENOUS | Status: DC | PRN
  Administered 2022-10-14: 8 ug via INTRAVENOUS

## 2022-10-14 MED ORDER — OXYCODONE HCL 5 MG PO TABS: 5.0000 mg | ORAL_TABLET | Freq: Once | ORAL | Status: DC | PRN

## 2022-10-14 MED ORDER — GLYCOPYRROLATE 0.2 MG/ML IJ SOLN
INTRAMUSCULAR | Status: DC | PRN
  Administered 2022-10-14: .1 mg via INTRAVENOUS

## 2022-10-14 MED ORDER — ONDANSETRON HCL 4 MG/2ML IJ SOLN
INTRAMUSCULAR | Status: DC | PRN
  Administered 2022-10-14: 4 mg via INTRAVENOUS

## 2022-10-14 MED ORDER — FENTANYL CITRATE (PF) 250 MCG/5ML IJ SOLN
INTRAMUSCULAR | Status: DC | PRN
  Administered 2022-10-14 (×4): 25 ug via INTRAVENOUS

## 2022-10-14 MED ORDER — FENTANYL CITRATE (PF) 100 MCG/2ML IJ SOLN
INTRAMUSCULAR | Status: AC
  Filled 2022-10-14: qty 2

## 2022-10-14 MED ORDER — 0.9 % SODIUM CHLORIDE (POUR BTL) OPTIME
TOPICAL | Status: DC | PRN
  Administered 2022-10-14: 1000 mL

## 2022-10-14 MED ORDER — MIDAZOLAM HCL 2 MG/2ML IJ SOLN
INTRAMUSCULAR | Status: DC | PRN
  Administered 2022-10-14: 2 mg via INTRAVENOUS

## 2022-10-14 MED ORDER — BUPIVACAINE HCL 0.5 % IJ SOLN
INTRAMUSCULAR | Status: DC | PRN
  Administered 2022-10-14: 25 mL

## 2022-10-14 MED ORDER — ONDANSETRON HCL 4 MG/2ML IJ SOLN: 4.0000 mg | Freq: Once | INTRAMUSCULAR | Status: DC | PRN

## 2022-10-14 MED ORDER — MIDAZOLAM HCL 2 MG/2ML IJ SOLN
INTRAMUSCULAR | Status: AC
  Filled 2022-10-14: qty 2

## 2022-10-14 MED ORDER — MIDAZOLAM HCL 2 MG/2ML IJ SOLN: INTRAMUSCULAR | Status: DC | PRN

## 2022-10-14 MED ORDER — ACETAMINOPHEN 500 MG PO TABS
ORAL_TABLET | ORAL | Status: AC
  Filled 2022-10-14: qty 2

## 2022-10-14 MED ORDER — FENTANYL CITRATE (PF) 100 MCG/2ML IJ SOLN: 25.0000 ug | INTRAMUSCULAR | Status: DC | PRN

## 2022-10-14 MED ORDER — ACETAMINOPHEN 500 MG PO TABS: 1000.0000 mg | ORAL_TABLET | ORAL | Status: DC

## 2022-10-14 MED ORDER — LIDOCAINE 2% (20 MG/ML) 5 ML SYRINGE
INTRAMUSCULAR | Status: DC | PRN
  Administered 2022-10-14: 40 mg via INTRAVENOUS

## 2022-10-14 MED ORDER — HYDROCODONE-ACETAMINOPHEN 10-325 MG PO TABS
1.0000 | ORAL_TABLET | Freq: Four times a day (QID) | ORAL | 0 refills | Status: DC | PRN
  Filled 2022-10-14: qty 15, 4d supply, fill #0

## 2022-10-14 SURGICAL SUPPLY — 55 items
APL SKNCLS STERI-STRIP NONHPOA (GAUZE/BANDAGES/DRESSINGS) ×1
BENZOIN TINCTURE PRP APPL 2/3 (GAUZE/BANDAGES/DRESSINGS) ×1 IMPLANT
BLADE EXTENDED COATED 6.5IN (ELECTRODE) IMPLANT
BLADE SURG 10 STRL SS (BLADE) IMPLANT
BRIEF MESH DISP LRG (UNDERPADS AND DIAPERS) ×1 IMPLANT
COVER BACK TABLE 60X90IN (DRAPES) ×1 IMPLANT
COVER MAYO STAND STRL (DRAPES) ×1 IMPLANT
DRAPE HYSTEROSCOPY (MISCELLANEOUS) IMPLANT
DRAPE LAPAROTOMY 100X72 PEDS (DRAPES) ×1 IMPLANT
DRAPE SHEET LG 3/4 BI-LAMINATE (DRAPES) IMPLANT
DRAPE UTILITY XL STRL (DRAPES) ×1 IMPLANT
ELECT REM PT RETURN 9FT ADLT (ELECTROSURGICAL) ×1
ELECTRODE REM PT RTRN 9FT ADLT (ELECTROSURGICAL) ×1 IMPLANT
GAUZE 4X4 16PLY ~~LOC~~+RFID DBL (SPONGE) ×1 IMPLANT
GAUZE PAD ABD 8X10 STRL (GAUZE/BANDAGES/DRESSINGS) ×1 IMPLANT
GAUZE SPONGE 4X4 12PLY STRL (GAUZE/BANDAGES/DRESSINGS) IMPLANT
GLOVE BIO SURGEON STRL SZ 6.5 (GLOVE) ×1 IMPLANT
GLOVE BIOGEL PI IND STRL 7.0 (GLOVE) ×1 IMPLANT
GLOVE INDICATOR 6.5 STRL GRN (GLOVE) ×1 IMPLANT
GOWN STRL REUS W/TWL XL LVL3 (GOWN DISPOSABLE) ×1 IMPLANT
HYDROGEN PEROXIDE 16OZ (MISCELLANEOUS) IMPLANT
IV CATH 14GX2 1/4 (CATHETERS) IMPLANT
IV CATH 18G SAFETY (IV SOLUTION) IMPLANT
KIT SIGMOIDOSCOPE (SET/KITS/TRAYS/PACK) IMPLANT
KIT TURNOVER CYSTO (KITS) ×1 IMPLANT
LEGGING LITHOTOMY PAIR STRL (DRAPES) IMPLANT
LOOP VASCLR MAXI BLUE 18IN ST (MISCELLANEOUS) IMPLANT
LOOP VASCULAR MAXI 18 BLUE (MISCELLANEOUS)
LOOPS VASCLR MAXI BLUE 18IN ST (MISCELLANEOUS) IMPLANT
NDL HYPO 22X1.5 SAFETY MO (MISCELLANEOUS) ×1 IMPLANT
NEEDLE HYPO 22X1.5 SAFETY MO (MISCELLANEOUS) ×1 IMPLANT
NS IRRIG 500ML POUR BTL (IV SOLUTION) ×1 IMPLANT
PACK BASIN DAY SURGERY FS (CUSTOM PROCEDURE TRAY) ×1 IMPLANT
PAD ARMBOARD 7.5X6 YLW CONV (MISCELLANEOUS) ×1 IMPLANT
PAD PREP 24X48 CUFFED NSTRL (MISCELLANEOUS) IMPLANT
PENCIL SMOKE EVACUATOR (MISCELLANEOUS) ×1 IMPLANT
SLEEVE SCD COMPRESS KNEE MED (STOCKING) ×1 IMPLANT
SPONGE HEMORRHOID 8X3CM (HEMOSTASIS) IMPLANT
SPONGE SURGIFOAM ABS GEL 100 (HEMOSTASIS) IMPLANT
SPONGE SURGIFOAM ABS GEL 12-7 (HEMOSTASIS) IMPLANT
SUT CHROMIC 2 0 SH (SUTURE) IMPLANT
SUT CHROMIC 3 0 SH 27 (SUTURE) IMPLANT
SUT ETHIBOND 0 (SUTURE) IMPLANT
SUT VIC AB 2-0 SH 27 (SUTURE)
SUT VIC AB 2-0 SH 27XBRD (SUTURE) IMPLANT
SUT VIC AB 3-0 SH 27 (SUTURE)
SUT VIC AB 3-0 SH 27XBRD (SUTURE) IMPLANT
SYR 10ML LL (SYRINGE) IMPLANT
SYR CONTROL 10ML LL (SYRINGE) ×1 IMPLANT
TOWEL OR 17X24 6PK STRL BLUE (TOWEL DISPOSABLE) ×1 IMPLANT
TRAY DSU PREP LF (CUSTOM PROCEDURE TRAY) ×1 IMPLANT
TUBE CONNECTING 12X1/4 (SUCTIONS) ×1 IMPLANT
UNDERPAD 30X36 HEAVY ABSORB (UNDERPADS AND DIAPERS) ×1 IMPLANT
VASCULAR TIE MAXI BLUE 18IN ST (MISCELLANEOUS)
YANKAUER SUCT BULB TIP NO VENT (SUCTIONS) ×1 IMPLANT

## 2022-10-14 NOTE — Op Note (Signed)
10/14/2022  11:02 AM  PATIENT:  Denise Ray  52 y.o. female  Patient Care Team: Early, Sung Amabile, NP as PCP - General (Nurse Practitioner)  PRE-OPERATIVE DIAGNOSIS:  ANAL CONDYLOMA  POST-OPERATIVE DIAGNOSIS:  ANAL CONDYLOMA  PROCEDURE:   EXAM UNDER ANESTHESIA with BIOPSY EXCISION OF ANAL CONDYLOMA   Surgeon(s): Romie Levee, MD  ASSISTANT: none   ANESTHESIA:   local and MAC  SPECIMEN:  Source of Specimen:  R lateral punch biopsy, Anterior midline anal condyloma  DISPOSITION OF SPECIMEN:  PATHOLOGY  COUNTS:  YES  PLAN OF CARE: Discharge to home after PACU  PATIENT DISPOSITION:  PACU - hemodynamically stable.  INDICATION: 52 year old female with a history of AIN grade 3.  She was noted to have a perianal condyloma in the office and I recommended excision and anal evaluation under anesthesia.   OR FINDINGS: Patient with a pedunculated condylomatous lesion at anterior midline externally.  Raised lesion in the right lateral perianal region approximately 1.5 cm in diameter that does not stain acetowhite.  2 anterior pigmented lesions that appear unchanged from previous examinations.  DESCRIPTION: the patient was identified in the preoperative holding area and taken to the OR where they were laid on the operating room table.  MAC anesthesia was induced without difficulty. The patient was then positioned in prone jackknife position with buttocks gently taped apart.  The patient was then prepped and draped in usual sterile fashion.  SCDs were noted to be in place prior to the initiation of anesthesia. A surgical timeout was performed indicating the correct patient, procedure, positioning and need for preoperative antibiotics.  A rectal block was performed using Marcaine with epinephrine.    I began with a digital rectal exam.  The condylomatous lesion was noted at anterior midline.  The anal canal was gently dilated to approximately 2 fingerbreadths.  I then placed a Hill-Ferguson  anoscope into the anal canal and evaluated this completely.  I evaluated the anal canal and found no other abnormal appearing lesions.  I then placed a 2% acetic acid soak sponge into the anal canal and 1 on the perianal region and allow this to soak for 2 minutes.  I then reevaluated the anal canal.  There was no acetowhite staining in the side and no discrete lesions noted.  There did appear to be a right lateral perianal lesion approximately 1.5 cm in diameter that did not stain acetowhite.  Given that it was a discrete lesion and her history of AIN, I did use a 3 mm punch biopsy to evaluate this pathologically.  This was closed with 3-0 chromic suture.  Condylomatous lesion was excised at its pedicle using Metzenbaum scissors and also closed with a 3-0 chromic suture.  Hemostasis was good.  The only other lesions that were noted were the pigmented left and right anterior lesions that the patient has had for many years.  They appear unchanged.  Additional Marcaine was placed at the biopsy sites and the patient was awakened from anesthesia and sent to the postanesthesia care unit in stable condition.  All counts were correct per operating room staff.  Vanita Panda, MD  Colorectal and General Surgery [facility]

## 2022-10-14 NOTE — Progress Notes (Signed)
Pt reports feeling like she needs her inhaler. Wheezing noted on auscultation. Pt states she brought her home albuterol inhaler and her mom is holding. Reviewed information with Dr. Okey Dupre, anesthesiologist. Per Dr. Okey Dupre, [facility] for pt to use home inhaler. Pt took two puffs of inhaler in pacu.

## 2022-10-14 NOTE — Discharge Instructions (Addendum)
Beginning the day after surgery:  You may sit in a tub of warm water 2-3 times a day to relieve discomfort.  Eat a regular diet high in fiber.  Avoid foods that give you constipation or diarrhea.  Avoid foods that are difficult to digest, such as seeds, nuts, corn or popcorn.  Do not go any longer than 2 days without a bowel movement.  You may take a dose of Milk of Magnesia if you become constipated.    Drink 6-8 glasses of water daily.  Remove Foley ~48h after surgery.  Walking is encouraged.  Avoid strenuous activity and heavy lifting for one month after surgery.    Call the office if you have any questions or concerns.  Call immediately if you develop:  Excessive rectal bleeding (more than a cup or passing large clots) Increased discomfort Fever greater than 100 F Difficulty urinating    Post Anesthesia Home Care Instructions  Activity: Get plenty of rest for the remainder of the day. A responsible individual must stay with you for 24 hours following the procedure.  For the next 24 hours, DO NOT: -Drive a car -Advertising copywriter -Drink alcoholic beverages -Take any medication unless instructed by your physician -Make any legal decisions or sign important papers.  Meals: Start with liquid foods such as gelatin or soup. Progress to regular foods as tolerated. Avoid greasy, spicy, heavy foods. If nausea and/or vomiting occur, drink only clear liquids until the nausea and/or vomiting subsides. Call your physician if vomiting continues.  Special Instructions/Symptoms: Your throat may feel dry or sore from the anesthesia or the breathing tube placed in your throat during surgery. If this causes discomfort, gargle with warm salt water. The discomfort should disappear within 24 hours.      Post Anesthesia Home Care Instructions  Activity: Get plenty of rest for the remainder of the day. A responsible individual must stay with you for 24 hours following the procedure.  For  the next 24 hours, DO NOT: -Drive a car -Advertising copywriter -Drink alcoholic beverages -Take any medication unless instructed by your physician -Make any legal decisions or sign important papers.  Meals: Start with liquid foods such as gelatin or soup. Progress to regular foods as tolerated. Avoid greasy, spicy, heavy foods. If nausea and/or vomiting occur, drink only clear liquids until the nausea and/or vomiting subsides. Call your physician if vomiting continues.  Special Instructions/Symptoms: Your throat may feel dry or sore from the anesthesia or the breathing tube placed in your throat during surgery. If this causes discomfort, gargle with warm salt water. The discomfort should disappear within 24 hours.

## 2022-10-14 NOTE — Anesthesia Preprocedure Evaluation (Addendum)
Anesthesia Evaluation  Patient identified by MRN, date of birth, ID band Patient awake    Reviewed: Allergy & Precautions, H&P , NPO status , Patient's Chart, lab work & pertinent test results  Airway Mallampati: III  TM Distance: <3 FB Neck ROM: Full    Dental no notable dental hx.    Pulmonary asthma , sleep apnea , COPD,  COPD inhaler, Current Smoker   Pulmonary exam normal breath sounds clear to auscultation       Cardiovascular + CAD and + DOE  Normal cardiovascular exam(-) dysrhythmias  Rhythm:Regular Rate:Normal     Neuro/Psych negative neurological ROS  negative psych ROS   GI/Hepatic Neg liver ROS,GERD  Medicated,,  Endo/Other  diabetesHypothyroidism  Morbid obesity  Renal/GU negative Renal ROS  negative genitourinary   Musculoskeletal negative musculoskeletal ROS (+)    Abdominal   Peds negative pediatric ROS (+)  Hematology negative hematology ROS (+)   Anesthesia Other Findings   Reproductive/Obstetrics negative OB ROS                             Anesthesia Physical Anesthesia Plan  ASA: 3  Anesthesia Plan: MAC   Post-op Pain Management: Minimal or no pain anticipated   Induction: Intravenous  PONV Risk Score and Plan: 2 and Ondansetron, Dexamethasone and Treatment may vary due to age or medical condition  Airway Management Planned: Nasal Cannula  Additional Equipment:   Intra-op Plan:   Post-operative Plan: Extubation in OR  Informed Consent: I have reviewed the patients History and Physical, chart, labs and discussed the procedure including the risks, benefits and alternatives for the proposed anesthesia with the patient or authorized representative who has indicated his/her understanding and acceptance.     Dental advisory given  Plan Discussed with: CRNA and Surgeon  Anesthesia Plan Comments: (Patient will be prone)       Anesthesia Quick  Evaluation

## 2022-10-14 NOTE — Anesthesia Postprocedure Evaluation (Signed)
Anesthesia Post Note  Patient: Denise Ray  Procedure(s) Performed: EXAM UNDER ANESTHESIA (Rectum) EXCISION OF ANAL CONDYLOMA (Rectum)     Patient location during evaluation: PACU Anesthesia Type: MAC Level of consciousness: awake and alert Pain management: pain level controlled Vital Signs Assessment: post-procedure vital signs reviewed and stable Respiratory status: spontaneous breathing, nonlabored ventilation, respiratory function stable and patient connected to nasal cannula oxygen Cardiovascular status: stable and blood pressure returned to baseline Postop Assessment: no apparent nausea or vomiting Anesthetic complications: no  No notable events documented.  Last Vitals:  Vitals:   10/14/22 1119 10/14/22 1123  BP: (!) 89/46 (!) 100/53  Pulse: 72 69  Resp: 14 16  Temp: (!) 36 C   SpO2: 97% 98%    Last Pain:  Vitals:   10/14/22 0737  TempSrc: Oral  PainSc: 0-No pain                 Shabria Egley S

## 2022-10-14 NOTE — Transfer of Care (Signed)
Immediate Anesthesia Transfer of Care Note  Patient: Denise Ray  Procedure(s) Performed: EXAM UNDER ANESTHESIA (Rectum) EXCISION OF ANAL CONDYLOMA (Rectum)  Patient Location: PACU  Anesthesia Type:MAC  Level of Consciousness: sedated  Airway & Oxygen Therapy: Patient Spontanous Breathing and Patient connected to face mask oxygen  Post-op Assessment: Report given to RN and Post -op Vital signs reviewed and stable  Post vital signs: Reviewed and stable  Last Vitals:  Vitals Value Taken Time  BP 89/46 10/14/22 1118  Temp    Pulse 71 10/14/22 1120  Resp 16 10/14/22 1120  SpO2 98 % 10/14/22 1120  Vitals shown include unvalidated device data.  Last Pain:  Vitals:   10/14/22 0737  TempSrc: Oral  PainSc: 0-No pain      Patients Stated Pain Goal: 5 (10/14/22 0737)  Complications: No notable events documented.

## 2022-10-14 NOTE — Progress Notes (Signed)
Foley care instructions printed and given to patient.  Verbalized understanding.

## 2022-10-14 NOTE — Progress Notes (Signed)
Leg strap, urinary leg bag, sitz bath, and 10cc syringe to patient to patient for foley removal and wound care.

## 2022-10-14 NOTE — H&P (Signed)
ROVIDER:  [name], MD   MRN: [id number] DOB: 23-Jan-1971 DATE OF ENCOUNTER: 07/13/2022 Subjective    Chief Complaint: No chief complaint on file.       History of Present Illness: Denise Ray is a 52 y.o. female who is seen today for anal lesions. Patient underwent hemorrhoidectomy and removal of anal condyloma in 2016.  Her pathology showed AIN 2 and 3 within the biopsy specimens.  We followed this area due to positive margin.  She also has a h/o abnormal pap smears but the last few have been normal per pt.  In 2017 she noticed that the right anterior lesion has become more bothersome and larger on exam.  She was taken back to the OR for excision of this lesion in early Aug 2017.  Final pathology revealed negative margins, but once again pathology showed AIN 3.  She used Aldara after that.  She was seen in my office and noted to have a pedunculated mass in her anal canal.  I recommended exam under anesthesia and excision.  She had excision of a right posterior perianal polyp in July 2021.  Patient did well with surgery.   Final pathology showed anal condyloma in the anal canal mass and the mole was positive for squamous cell carcinoma in situ.  She has had no further issues since then.  She is here today for follow-up after Dr Hyacinth Meeker noted a new anal lesion.         Past Medical History:  Diagnosis Date   Anal cancer (HCC)     Anal polyp     Anxiety     Arthritis      Right hip   Bladder cancer (HCC)     Bradycardia 05/07/2015   Cervical cancer (HCC)     Chronic congestive heart failure, unspecified heart failure type (HCC) 07/07/2022   Chronic pain      followed by pain clinic   Complication of anesthesia      post op urinary retention   COPD with asthma followed by pcp and as needed [place] [facility]    hx exacerbation's  --- (11-29-2019  per last exacerbation >2 years;  last used rescue inhaler a week ago)   DOE (dyspnea on exertion)      11-29-2019  per pt  going flight of stairs but normal daily activites no sob   Family history of adverse reaction to anesthesia      mother -- ponv ;  per pt in 04/ 2021 father had cardiac arrest and Atrial fib intraoperative for circumcision (this was in [place]) put on venitator for two days then had nuclear stress test per pt showed disease but no cath done, still  has atrial fib;  she also stated prior to this he known cardiac disease with no intervention   Former smoker 03/24/2017   GERD (gastroesophageal reflux disease)     Hemorrhoids     History of acute respiratory failure      06/ 2015  and 12/ 2016  CAP and Asthma exacerbation--  vented both times (ARDS)   History of anal dysplasia      AIN 2/ 3     History of cervical dysplasia      CIN 2 in 2000   History of chest pain      11-29-2019 per pt had cardiology evaluation for chest pain (but pt stated has not had any chest pain or cardiac symtpoms since) referred by pcp  w/ dr  nishan note in epic 05-22-2019;  pt had nuclear stress test the showed normal perfusion no ischemia and ef >65% ,  also had echo same day showed normal w/ ef 65%   History of hypothyroidism     History of pneumonia      hx Required ventilatory support   History of respiratory failure 10/31/2013   History of TIA (transient ischemic attack)      Caused by medication reaction with combination of wellbutrin and estrogen   Migraines     OSA on CPAP      11-29-2019 pt stated uses every night   Type 2 diabetes mellitus (HCC)      followed by pcp  (11-29-2019 pt stated does not check blood sugar)   Wears glasses           Past Surgical History:  Procedure Laterality Date   ABDOMINAL HYSTERECTOMY       CESAREAN SECTION   2001   CO2 LASER APPLICATION N/A 10/11/2014    Procedure: CO2 LASER APPLICATION;  Surgeon: Romie Levee, MD;  Location: [facility];  Service: General;  Laterality: N/A;   CONIZATION OF CERVIX   2000   DILATION AND CURETTAGE OF UTERUS        EVALUATION UNDER ANESTHESIA WITH HEMORRHOIDECTOMY N/A 06/15/2014    Procedure: EXAM UNDER ANESTHESIA WITH HEMORRHOIDECTOMY;  Surgeon: Frederik Schmidt, MD;  Location: [facility];  Service: General;  Laterality: N/A;   EXCISION OF SKIN TAG N/A 06/15/2014    Procedure: EXCISION OF SKIN TAGS;  Surgeon: Frederik Schmidt, MD;  Location: [facility];  Service: General;  Laterality: N/A;   HIGH RESOLUTION ANOSCOPY N/A 10/11/2014    Procedure: HIGH RESOLUTION ANOSCOPY WITH BIOPSY;  Surgeon: Romie Levee, MD;  Location: [facility];  Service: General;  Laterality: N/A;   HYSTEROSCOPY WITH D & C   09/12/2007  &  2005   MASS EXCISION N/A 12/07/2019    Procedure: EXCISION OF ANAL POLYP AND PERIANAL MOLE;  Surgeon: Romie Levee, MD;  Location: [facility];  Service: General;  Laterality: N/A;   RECTAL EXAM UNDER ANESTHESIA N/A 10/11/2014    Procedure: ANAL EXAM UNDER ANESTHESIA;  Surgeon: Romie Levee, MD;  Location: [facility];  Service: General;  Laterality: N/A;   RECTAL EXAM UNDER ANESTHESIA N/A 12/18/2015    Procedure: ANAL EXAM UNDER ANESTHESIA  EXCISION ANAL MARGIN LESION;  Surgeon: Romie Levee, MD;  Location: [facility];  Service: General;  Laterality: N/A;   RECTAL EXAM UNDER ANESTHESIA N/A 12/07/2019    Procedure: ANAL EXAM UNDER ANESTHESIA;  Surgeon: Romie Levee, MD;  Location: [facility];  Service: General;  Laterality: N/A;   ROBOTIC ASSISTED TOTAL HYSTERECTOMY N/A 10/11/2012    Procedure: ROBOTIC ASSISTED TOTAL HYSTERECTOMY;  Surgeon: Annamaria Boots, MD;  Location: [facility];  Service: Gynecology;  Laterality: N/A;   SALPINGOOPHORECTOMY Bilateral 10/11/2012    Procedure: SALPINGO OOPHORECTOMY;  Surgeon: Annamaria Boots, MD;  Location: [facility];  Service: Gynecology;  Laterality: Bilateral;   TRANSTHORACIC ECHOCARDIOGRAM   05/07/2015    lvsf vigorous, ef 70-75%/  trivial MR and TR   VIDEO  ASSISTED THORACOSCOPY (VATS)/THOROCOTOMY Right 11/04/2001    w/ Resection duplication esophageal cyst  . Social History         Socioeconomic History   Marital status: Divorced      Spouse name: Not on file   Number of children: 1  Years of education: Not on file   Highest education level: Not on file  Occupational History   Occupation: Respiratory Therapist      Employer:  [employer]  Tobacco Use   Smoking status: Every Day      Packs/day: 1.00      Years: 30.00      Total pack years: 30.00      Types: Cigarettes   Smokeless tobacco: Never  Vaping Use   Vaping Use: Never used  Substance and Sexual Activity   Alcohol use: Not Currently      Alcohol/week: 0.0 standard drinks of alcohol      Comment: Rarely   Drug use: Never   Sexual activity: Yes      Partners: Male      Birth control/protection: Surgical      Comment: Hysterectomy  Other Topics Concern   Not on file  Social History Narrative    Divorced.  Mother and son live with her.    Social Determinants of Health    Financial Resource Strain: Not on file  Food Insecurity: Not on file  Transportation Needs: Not on file  Physical Activity: Not on file  Stress: Not on file  Social Connections: Not on file  Intimate Partner Violence: Not on file         Family History  Problem Relation Age of Onset   Breast cancer Brother 37   Breast cancer Maternal Aunt     Depression Mother     Anesthesia problems Mother          post-op N/V   Alcohol abuse Father     Alcohol abuse Cousin        Current Outpatient Medications:    albuterol (PROVENTIL HFA;VENTOLIN HFA) 108 (90 BASE) MCG/ACT inhaler, Inhale 2 puffs into the lungs every 6 (six) hours as needed. wheezing, Disp: 1 Inhaler, Rfl: 11   ALPRAZolam (XANAX) 1 MG tablet, Take 1 tablet (1 mg total) by mouth 2 (two) times daily as needed., Disp: 60 tablet, Rfl: 0   Ascorbic Acid (VITAMIN C) 1000 MG tablet, Take 2 (two) Tablet by mouth daily, Disp: 20  tablet, Rfl: 0   aspirin EC 81 MG tablet, Take 81 mg by mouth daily., Disp: , Rfl:    Cholecalciferol (VITAMIN D) 50 MCG (2000 UT) tablet, Take 1 (one) Tablet by mouth daily, Disp: 10 tablet, Rfl: 0   famotidine (PEPCID) 20 MG tablet, Take 20 mg by mouth at bedtime. , Disp: , Rfl:    gabapentin (NEURONTIN) 100 MG capsule, Take 2 capsules (200 mg total) by mouth 4 (four) times daily., Disp: 240 capsule, Rfl: 5   HYDROcodone-acetaminophen (NORCO) 10-325 MG tablet, Take 1 tablet by mouth every 6 (six) hours as needed for pain., Disp: 120 tablet, Rfl: 0   HYDROcodone-acetaminophen (NORCO) 10-325 MG tablet, Take 1 tablet by mouth every 6 (six) hours as needed for pain., Disp: 120 tablet, Rfl: 0   HYDROcodone-acetaminophen (NORCO/VICODIN) 5-325 MG tablet, Take 1 tablet by mouth every 6 (six) hours as needed for pain (Patient not taking: Reported on 07/07/2022), Disp: 120 tablet, Rfl: 0   levothyroxine (SYNTHROID) 50 MCG tablet, Take 1 tablet (50 mcg total) by mouth daily., Disp: 90 tablet, Rfl: 3   loratadine (CLARITIN) 10 MG tablet, Take 10 mg by mouth at bedtime. , Disp: , Rfl:    metFORMIN (GLUCOPHAGE) 850 MG tablet, Take 1 tablet (850 mg total) by mouth 2 (two) times daily., Disp: 180 tablet, Rfl:  3   Multiple Vitamins-Calcium (ONE-A-DAY WOMENS FORMULA PO), Take by mouth daily. , Disp: , Rfl:    nystatin (MYCOSTATIN/NYSTOP) powder, Apply 1 Application topically 2 (two) times daily. Apply to affected area for up to 7 days, Disp: 15 g, Rfl: 2   nystatin cream (MYCOSTATIN), Apply topically 2 (two) times daily. Apply nightly as needed for treatment of candida, Disp: 30 g, Rfl: 0   Omega-3 Fatty Acids (OMEGA-3 FISH OIL PO), Take 720 mg by mouth in the morning and at bedtime., Disp: , Rfl:    ondansetron (ZOFRAN) 4 MG tablet, Take 1 tablet (4 mg total) by mouth every 8 (eight) hours as needed for nausea or vomiting., Disp: 30 tablet, Rfl: 2   pantoprazole (PROTONIX) 40 MG tablet, Take 1 tablet (40 mg total)  by mouth daily., Disp: 90 tablet, Rfl: 3   rosuvastatin (CRESTOR) 20 MG tablet, Take 1 tablet (20 mg total) by mouth at bedtime., Disp: 90 tablet, Rfl: 3   Semaglutide, 1 MG/DOSE, (OZEMPIC, 1 MG/DOSE,) 4 MG/3ML SOPN, Inject 1 mg into the skin every 7 (seven) days., Disp: 3 mL, Rfl: 5   venlafaxine XR (EFFEXOR-XR) 150 MG 24 hr capsule, Take 1 capsule (150 mg total) by mouth daily., Disp: 90 capsule, Rfl: 3      Allergies  Allergen Reactions   Oseltamivir Phosphate Other (See Comments) and Cough      BRONCHOCONSTRICTION   Estrogens Other (See Comments)      SYMPTOMS OF A STROKE   Wellbutrin [Bupropion] Other (See Comments)      CAUSED TIA WHEN TAKEN WITH ESTROGEN   Ibuprofen Other (See Comments)      Avoids due to nosebleeds   Other        Bitrex, used in FIT testing    Morphine And Related Other (See Comments)      DOES NOT RELIEVE PAIN    Review of Systems - Negative except as stated above       Objective:      BP (!) 142/76   Pulse 72   Temp 97.8 F (36.6 C) (Oral)   Resp 17   Ht 5\' 5"  (1.651 m)   Wt 111.7 kg   SpO2 94%   BMI 40.99 kg/m   General appearance - alert, well appearing, and in no distress  CV: RRR Lungs: CTA Abd: soft Rectal: anal condyloma anterior           Assessment and Plan:  Diagnoses and all orders for this visit:   Anal condyloma      repeat exam in 3 months given symptoms   52 year old female with history of multiple AIN grade 3 lesions perianally.  She also had a AIN grade 3 pigmented lesion.  On exam today there was a moderate-sized anal condyloma in the anterior anal canal.  No other lesions were palpated.  I recommended operative exam in the OR with possible biopsy and excision of the anal condyloma.  Patient has a history of urinary retention after surgery and will most likely need a Foley catheter upon discharge after surgery.  She can remove this herself at home 2 days after surgery.   No follow-ups on file.       Vanita Panda, MD Colon and Rectal Surgery [facility]

## 2022-10-15 ENCOUNTER — Encounter (HOSPITAL_BASED_OUTPATIENT_CLINIC_OR_DEPARTMENT_OTHER): Payer: Self-pay | Admitting: General Surgery

## 2022-10-15 LAB — SURGICAL PATHOLOGY

## 2022-10-16 ENCOUNTER — Other Ambulatory Visit (HOSPITAL_COMMUNITY): Payer: Self-pay

## 2022-10-16 ENCOUNTER — Other Ambulatory Visit: Payer: Self-pay

## 2022-10-19 ENCOUNTER — Ambulatory Visit: Payer: Commercial Managed Care - PPO | Admitting: Nurse Practitioner

## 2022-10-19 ENCOUNTER — Encounter: Payer: Self-pay | Admitting: Nurse Practitioner

## 2022-10-19 VITALS — BP 122/78 | HR 73 | Wt 251.6 lb

## 2022-10-19 DIAGNOSIS — G893 Neoplasm related pain (acute) (chronic): Secondary | ICD-10-CM

## 2022-10-19 DIAGNOSIS — E039 Hypothyroidism, unspecified: Secondary | ICD-10-CM | POA: Diagnosis not present

## 2022-10-19 DIAGNOSIS — E119 Type 2 diabetes mellitus without complications: Secondary | ICD-10-CM | POA: Diagnosis not present

## 2022-10-19 DIAGNOSIS — D013 Carcinoma in situ of anus and anal canal: Secondary | ICD-10-CM | POA: Diagnosis not present

## 2022-10-19 MED ORDER — HYDROCODONE-ACETAMINOPHEN 10-325 MG PO TABS
1.0000 | ORAL_TABLET | Freq: Four times a day (QID) | ORAL | 0 refills | Status: DC | PRN
Start: 2022-10-19 — End: 2022-11-17
  Filled 2022-10-19: qty 120, 30d supply, fill #0

## 2022-10-19 NOTE — Progress Notes (Signed)
Shawna Clamp, DNP, AGNP-c [facility]  [address] [place], [place] [address] [phone]  ESTABLISHED PATIENT- Chronic Health and/or Follow-Up Visit  Blood pressure 122/78, pulse 73, weight 251 lb 9.6 oz (114.1 kg).    Denise Ray is a 52 y.o. year old female presenting today for evaluation and management of chronic conditions.   Wyatt Mage presents today with the following concerns:  Discomfort due to an incident where her puppies accidentally pulled out her catheter. She reports that she is now able to urinate on her own.  She recently underwent a procedure to remove condyloma, and the doctor performed a biopsy due to a loss of pigment in her tissue, which did not change with dye application. The results and implications of the biopsy are unclear to her.  Prior to the procedure, she had ceased all her medications for about a month and a half to identify the cause of her persistent stomach sickness. She plans to reintroduce her medications one by one to determine the specific cause of her symptoms. The nausea subsides when she is not on medication.  Regarding her diabetes management, her pre-operative blood sugar levels were around 130, and she is currently not taking any medications for it.  She is managing her pain with Norco, stating that her pain is well-controlled when she takes her medication as prescribed. She has sufficient refills for her other medications, including Xanax.  She expresses frustration over the lack of clarity regarding the findings and the nature of the proposed future surgery.  She is considering stopping Metformin due to its side effects and continuing with Ozempic, which she suspects might be contributing to her nausea.  All ROS negative with exception of what is listed above.   PHYSICAL EXAM Physical Exam Vitals and nursing note reviewed.  Constitutional:      Appearance: Normal appearance.  HENT:     Head: Normocephalic.   Eyes:     Pupils: Pupils are equal, round, and reactive to light.  Cardiovascular:     Rate and Rhythm: Normal rate and regular rhythm.     Pulses: Normal pulses.     Heart sounds: Normal heart sounds.  Pulmonary:     Effort: Pulmonary effort is normal.     Breath sounds: Normal breath sounds.  Abdominal:     General: Bowel sounds are normal.     Palpations: Abdomen is soft.  Musculoskeletal:        General: Normal range of motion.     Cervical back: Normal range of motion.  Skin:    General: Skin is warm.  Neurological:     General: No focal deficit present.     Mental Status: She is alert and oriented to person, place, and time.  Psychiatric:        Mood and Affect: Mood normal.     PLAN Problem List Items Addressed This Visit     Acquired hypothyroidism    The patient reports no thyroid symptoms. Plan: - Hold off on labs for now due to recent surgery. - Reassess thyroid function once the patient has been back on medications for a while and schedule labs accordingly.      Diabetes (HCC)    The patient reports a pre-op blood sugar level of 130 without taking any medications. Plan: - Continue monitoring blood sugar levels and adjust medications as needed based on the patient's response to reintroducing medications.      AIN grade III    The patient had  a recent condyloma removal procedure, and a biopsy was taken due to loss of pigment in the tissue. The patient is unsure about the results and the potential need for further surgery. Plan: - Follow up on biopsy results and discuss with the patient. - Schedule a follow-up appointment after the patient's visit with the doctor on the 18th to discuss any further surgical plans or treatment options.      Chronic neoplasm-related pain - Primary    The patient reports controlled pain with Norco use. A five-day supply of Norco was provided after the recent surgery. Plan: - Refill Norco prescription (120 tablets) and send to  [facility]. - Continue monitoring pain levels and adjust medication as needed.      Relevant Medications   HYDROcodone-acetaminophen (NORCO) 10-325 MG tablet    No follow-ups on file.   Shawna Clamp, DNP, AGNP-c 10/19/2022  4:02 PM

## 2022-10-20 ENCOUNTER — Other Ambulatory Visit: Payer: Self-pay

## 2022-10-26 ENCOUNTER — Other Ambulatory Visit (HOSPITAL_COMMUNITY): Payer: Self-pay

## 2022-10-29 ENCOUNTER — Other Ambulatory Visit: Payer: Self-pay

## 2022-11-03 ENCOUNTER — Ambulatory Visit: Payer: Self-pay | Admitting: General Surgery

## 2022-11-03 DIAGNOSIS — D013 Carcinoma in situ of anus and anal canal: Secondary | ICD-10-CM | POA: Diagnosis not present

## 2022-11-06 ENCOUNTER — Encounter (HOSPITAL_BASED_OUTPATIENT_CLINIC_OR_DEPARTMENT_OTHER): Payer: Self-pay | Admitting: General Surgery

## 2022-11-06 NOTE — Progress Notes (Signed)
Spoke w/ via phone for pre-op interview---  pt Lab needs dos----   [facility] Rx results------ current EKG in epic/ chart COVID test -----patient states asymptomatic no test needed Arrive at ------- 1130 on 11-20-2022 NPO after MN NO Solid Food.  Clear liquids from MN until--- 1030 Med rec completed Medications to take morning of surgery ----- synthroid, protonix, gabapentin, norco/ xanax if needed, do not take asa morning of surgery Diabetic medication ----- pt is no longer taking metformin Patient instructed no nail polish to be worn day of surgery Patient instructed to bring photo id and insurance card day of surgery Patient aware to have Driver (ride ) / caregiver    for 24 hours after surgery -- friend, larry Patient Special Instructions ----- asked to bring rescue inhaler Pre-Op special Instructions ----- n/a Patient verbalized understanding of instructions that were given at this phone interview. Patient denies shortness of breath, chest pain, fever, cough at this phone interview.   Anesthesia Review:  COPD/ asthma (still smokes 1 ppd)  last used rescue inhaler 2 wks ago;  DM2;  OSA w/ cpap (per pt usually uses nightly, however due to seasonal allergies has not used every night);   Per Dr Maisie Fus H&P in epic due to pt post op urinary retention plans to d/c pt with foley cath.  PCP:  Enid Skeens NP (lov 07-07-2022) Cardiologist :  evaluated by Dr Eden Emms 05-22-2019 in epic Chest x-ray : 03-10-2022 EKG : 03-10-2022 Echo : 05-30-2019 Stress test: nuclear 05-30-2019 Cardiac Cath : no Activity level: sob w/ stairs but w/ normal activities denies sob Sleep Study/ CPAP : yes/ yes Fasting Blood Sugar :      / Checks Blood Sugar -- times a day:  does not check Blood Thinner/ Instructions /Last Dose: no ASA / Instructions/ Last Dose : asa 81mg /  per pt dr Maisie Fus was not having her to stop prior to surgery

## 2022-11-15 NOTE — Assessment & Plan Note (Signed)
The patient reports a pre-op blood sugar level of 130 without taking any medications. Plan: - Continue monitoring blood sugar levels and adjust medications as needed based on the patient's response to reintroducing medications.

## 2022-11-15 NOTE — Assessment & Plan Note (Signed)
The patient reports controlled pain with Norco use. A five-day supply of Norco was provided after the recent surgery. Plan: - Refill Norco prescription (120 tablets) and send to [facility]. - Continue monitoring pain levels and adjust medication as needed.

## 2022-11-15 NOTE — Assessment & Plan Note (Signed)
The patient reports no thyroid symptoms. Plan: - Hold off on labs for now due to recent surgery. - Reassess thyroid function once the patient has been back on medications for a while and schedule labs accordingly.

## 2022-11-15 NOTE — Assessment & Plan Note (Signed)
The patient had a recent condyloma removal procedure, and a biopsy was taken due to loss of pigment in the tissue. The patient is unsure about the results and the potential need for further surgery. Plan: - Follow up on biopsy results and discuss with the patient. - Schedule a follow-up appointment after the patient's visit with the doctor on the 18th to discuss any further surgical plans or treatment options.

## 2022-11-17 ENCOUNTER — Other Ambulatory Visit: Payer: Self-pay

## 2022-11-17 ENCOUNTER — Other Ambulatory Visit (HOSPITAL_COMMUNITY): Payer: Self-pay

## 2022-11-17 ENCOUNTER — Other Ambulatory Visit: Payer: Self-pay | Admitting: Nurse Practitioner

## 2022-11-17 DIAGNOSIS — F4310 Post-traumatic stress disorder, unspecified: Secondary | ICD-10-CM

## 2022-11-17 DIAGNOSIS — G893 Neoplasm related pain (acute) (chronic): Secondary | ICD-10-CM

## 2022-11-18 ENCOUNTER — Other Ambulatory Visit (HOSPITAL_COMMUNITY): Payer: Self-pay

## 2022-11-18 MED ORDER — ALPRAZOLAM 1 MG PO TABS
1.0000 mg | ORAL_TABLET | Freq: Two times a day (BID) | ORAL | 2 refills | Status: AC | PRN
Start: 2022-11-18 — End: ?
  Filled 2022-11-18 – 2022-11-20 (×3): qty 60, 30d supply, fill #0
  Filled 2022-12-29: qty 60, 30d supply, fill #1
  Filled 2023-01-28: qty 60, 30d supply, fill #2

## 2022-11-18 MED ORDER — HYDROCODONE-ACETAMINOPHEN 10-325 MG PO TABS
1.0000 | ORAL_TABLET | Freq: Four times a day (QID) | ORAL | 0 refills | Status: DC | PRN
Start: 2022-11-18 — End: 2022-12-29
  Filled 2022-11-18: qty 120, 30d supply, fill #0

## 2022-11-20 ENCOUNTER — Other Ambulatory Visit (HOSPITAL_COMMUNITY): Payer: Self-pay

## 2022-11-20 ENCOUNTER — Ambulatory Visit (HOSPITAL_BASED_OUTPATIENT_CLINIC_OR_DEPARTMENT_OTHER): Payer: Commercial Managed Care - PPO | Admitting: Anesthesiology

## 2022-11-20 ENCOUNTER — Ambulatory Visit (HOSPITAL_BASED_OUTPATIENT_CLINIC_OR_DEPARTMENT_OTHER)
Admission: RE | Admit: 2022-11-20 | Discharge: 2022-11-20 | Disposition: A | Discharge status: Home / Self Care | Payer: Commercial Managed Care - PPO | Source: Home / Self Care | Attending: General Surgery | Admitting: General Surgery

## 2022-11-20 ENCOUNTER — Encounter (HOSPITAL_BASED_OUTPATIENT_CLINIC_OR_DEPARTMENT_OTHER)
Admission: RE | Disposition: A | Discharge status: Home / Self Care | Payer: Self-pay | Source: Home / Self Care | Attending: General Surgery

## 2022-11-20 ENCOUNTER — Other Ambulatory Visit: Payer: Self-pay

## 2022-11-20 ENCOUNTER — Encounter (HOSPITAL_BASED_OUTPATIENT_CLINIC_OR_DEPARTMENT_OTHER): Payer: Self-pay | Admitting: General Surgery

## 2022-11-20 DIAGNOSIS — I509 Heart failure, unspecified: Secondary | ICD-10-CM | POA: Insufficient documentation

## 2022-11-20 DIAGNOSIS — J4489 Other specified chronic obstructive pulmonary disease: Secondary | ICD-10-CM | POA: Insufficient documentation

## 2022-11-20 DIAGNOSIS — F17210 Nicotine dependence, cigarettes, uncomplicated: Secondary | ICD-10-CM | POA: Diagnosis not present

## 2022-11-20 DIAGNOSIS — G4733 Obstructive sleep apnea (adult) (pediatric): Secondary | ICD-10-CM | POA: Diagnosis not present

## 2022-11-20 DIAGNOSIS — E039 Hypothyroidism, unspecified: Secondary | ICD-10-CM | POA: Diagnosis not present

## 2022-11-20 DIAGNOSIS — Z6841 Body Mass Index (BMI) 40.0 and over, adult: Secondary | ICD-10-CM | POA: Diagnosis not present

## 2022-11-20 DIAGNOSIS — A630 Anogenital (venereal) warts: Secondary | ICD-10-CM | POA: Insufficient documentation

## 2022-11-20 DIAGNOSIS — Z7985 Long-term (current) use of injectable non-insulin antidiabetic drugs: Secondary | ICD-10-CM | POA: Diagnosis not present

## 2022-11-20 DIAGNOSIS — Z9889 Other specified postprocedural states: Secondary | ICD-10-CM | POA: Insufficient documentation

## 2022-11-20 DIAGNOSIS — K219 Gastro-esophageal reflux disease without esophagitis: Secondary | ICD-10-CM | POA: Insufficient documentation

## 2022-11-20 DIAGNOSIS — J449 Chronic obstructive pulmonary disease, unspecified: Secondary | ICD-10-CM

## 2022-11-20 DIAGNOSIS — G893 Neoplasm related pain (acute) (chronic): Secondary | ICD-10-CM

## 2022-11-20 DIAGNOSIS — F419 Anxiety disorder, unspecified: Secondary | ICD-10-CM | POA: Insufficient documentation

## 2022-11-20 DIAGNOSIS — D013 Carcinoma in situ of anus and anal canal: Secondary | ICD-10-CM

## 2022-11-20 DIAGNOSIS — Z7984 Long term (current) use of oral hypoglycemic drugs: Secondary | ICD-10-CM | POA: Insufficient documentation

## 2022-11-20 DIAGNOSIS — I25119 Atherosclerotic heart disease of native coronary artery with unspecified angina pectoris: Secondary | ICD-10-CM | POA: Diagnosis not present

## 2022-11-20 DIAGNOSIS — E119 Type 2 diabetes mellitus without complications: Secondary | ICD-10-CM | POA: Insufficient documentation

## 2022-11-20 DIAGNOSIS — Z01818 Encounter for other preprocedural examination: Secondary | ICD-10-CM

## 2022-11-20 DIAGNOSIS — K629 Disease of anus and rectum, unspecified: Secondary | ICD-10-CM | POA: Diagnosis present

## 2022-11-20 HISTORY — PX: LASER ABLATION CONDOLAMATA: SHX5941

## 2022-11-20 LAB — GLUCOSE, CAPILLARY: Glucose-Capillary: 123 mg/dL — ABNORMAL HIGH (ref 70–99)

## 2022-11-20 LAB — POCT I-STAT, CHEM 8
BUN: 10 mg/dL (ref 6–20)
Calcium, Ion: 1.25 mmol/L (ref 1.15–1.40)
Chloride: 103 mmol/L (ref 98–111)
Creatinine, Ser: 0.6 mg/dL (ref 0.44–1.00)
Glucose, Bld: 114 mg/dL — ABNORMAL HIGH (ref 70–99)
HCT: 45 % (ref 36.0–46.0)
Hemoglobin: 15.3 g/dL — ABNORMAL HIGH (ref 12.0–15.0)
Potassium: 3.8 mmol/L (ref 3.5–5.1)
Sodium: 140 mmol/L (ref 135–145)
TCO2: 25 mmol/L (ref 22–32)

## 2022-11-20 SURGERY — ABLATION, CONDYLOMA, USING LASER
Anesthesia: General | Site: Rectum

## 2022-11-20 MED ORDER — LIDOCAINE 2% (20 MG/ML) 5 ML SYRINGE
INTRAMUSCULAR | Status: DC | PRN
  Administered 2022-11-20: 100 mg via INTRAVENOUS

## 2022-11-20 MED ORDER — ONDANSETRON HCL 4 MG/2ML IJ SOLN
INTRAMUSCULAR | Status: DC | PRN
  Administered 2022-11-20: 4 mg via INTRAVENOUS

## 2022-11-20 MED ORDER — SILVER SULFADIAZINE 1 % EX CREA
TOPICAL_CREAM | CUTANEOUS | Status: DC | PRN
  Administered 2022-11-20: 1 via TOPICAL

## 2022-11-20 MED ORDER — FENTANYL CITRATE (PF) 100 MCG/2ML IJ SOLN: 25.0000 ug | INTRAMUSCULAR | Status: DC | PRN

## 2022-11-20 MED ORDER — PROMETHAZINE HCL 25 MG/ML IJ SOLN: 6.2500 mg | INTRAMUSCULAR | Status: DC | PRN

## 2022-11-20 MED ORDER — FENTANYL CITRATE (PF) 100 MCG/2ML IJ SOLN
INTRAMUSCULAR | Status: DC | PRN
  Administered 2022-11-20: 50 ug via INTRAVENOUS

## 2022-11-20 MED ORDER — SUGAMMADEX SODIUM 200 MG/2ML IV SOLN
INTRAVENOUS | Status: DC | PRN
  Administered 2022-11-20: 200 mg via INTRAVENOUS

## 2022-11-20 MED ORDER — ONDANSETRON HCL 4 MG/2ML IJ SOLN
INTRAMUSCULAR | Status: AC
  Filled 2022-11-20: qty 2

## 2022-11-20 MED ORDER — LACTATED RINGERS IV SOLN
INTRAVENOUS | Status: DC
  Administered 2022-11-20: 1000 mL via INTRAVENOUS

## 2022-11-20 MED ORDER — DEXAMETHASONE SODIUM PHOSPHATE 10 MG/ML IJ SOLN
INTRAMUSCULAR | Status: DC | PRN
  Administered 2022-11-20: 5 mg via INTRAVENOUS

## 2022-11-20 MED ORDER — ROCURONIUM BROMIDE 10 MG/ML (PF) SYRINGE
PREFILLED_SYRINGE | INTRAVENOUS | Status: DC | PRN
  Administered 2022-11-20: 50 mg via INTRAVENOUS

## 2022-11-20 MED ORDER — MIDAZOLAM HCL 2 MG/2ML IJ SOLN
INTRAMUSCULAR | Status: AC
  Filled 2022-11-20: qty 2

## 2022-11-20 MED ORDER — BUPIVACAINE-EPINEPHRINE 0.25% -1:200000 IJ SOLN
INTRAMUSCULAR | Status: DC | PRN
  Administered 2022-11-20: 50 mL

## 2022-11-20 MED ORDER — PROPOFOL 10 MG/ML IV BOLUS
INTRAVENOUS | Status: AC
  Filled 2022-11-20: qty 20

## 2022-11-20 MED ORDER — BUPIVACAINE LIPOSOME 1.3 % IJ SUSP: 20.0000 mL | Freq: Once | INTRAMUSCULAR | Status: DC

## 2022-11-20 MED ORDER — FENTANYL CITRATE (PF) 100 MCG/2ML IJ SOLN
INTRAMUSCULAR | Status: AC
  Filled 2022-11-20: qty 2

## 2022-11-20 MED ORDER — SODIUM CHLORIDE 0.9% FLUSH: 3.0000 mL | Freq: Two times a day (BID) | INTRAVENOUS | Status: DC

## 2022-11-20 MED ORDER — ACETAMINOPHEN 500 MG PO TABS
ORAL_TABLET | ORAL | Status: AC
  Filled 2022-11-20: qty 2

## 2022-11-20 MED ORDER — DEXAMETHASONE SODIUM PHOSPHATE 10 MG/ML IJ SOLN
INTRAMUSCULAR | Status: AC
  Filled 2022-11-20: qty 1

## 2022-11-20 MED ORDER — PROPOFOL 10 MG/ML IV BOLUS
INTRAVENOUS | Status: DC | PRN
  Administered 2022-11-20: 160 mg via INTRAVENOUS

## 2022-11-20 MED ORDER — ACETAMINOPHEN 500 MG PO TABS
1000.0000 mg | ORAL_TABLET | Freq: Once | ORAL | Status: AC
  Administered 2022-11-20: 1000 mg via ORAL

## 2022-11-20 MED ORDER — 0.9 % SODIUM CHLORIDE (POUR BTL) OPTIME
TOPICAL | Status: DC | PRN
  Administered 2022-11-20: 500 mL

## 2022-11-20 MED ORDER — MIDAZOLAM HCL 5 MG/5ML IJ SOLN
INTRAMUSCULAR | Status: DC | PRN
  Administered 2022-11-20: 2 mg via INTRAVENOUS

## 2022-11-20 MED ORDER — ROCURONIUM BROMIDE 10 MG/ML (PF) SYRINGE
PREFILLED_SYRINGE | INTRAVENOUS | Status: AC
  Filled 2022-11-20: qty 10

## 2022-11-20 MED ORDER — LIDOCAINE HCL (PF) 2 % IJ SOLN
INTRAMUSCULAR | Status: AC
  Filled 2022-11-20: qty 10

## 2022-11-20 SURGICAL SUPPLY — 37 items
BLADE SURG 10 STRL SS (BLADE) IMPLANT
BRIEF MESH DISP LRG (UNDERPADS AND DIAPERS) ×1 IMPLANT
COVER BACK TABLE 60X90IN (DRAPES) ×1 IMPLANT
COVER MAYO STAND STRL (DRAPES) ×1 IMPLANT
DRAPE HYSTEROSCOPY (MISCELLANEOUS) IMPLANT
DRAPE LAPAROTOMY 100X72 PEDS (DRAPES) ×1 IMPLANT
DRAPE SHEET LG 3/4 BI-LAMINATE (DRAPES) IMPLANT
DRAPE UTILITY XL STRL (DRAPES) ×1 IMPLANT
ELECT REM PT RETURN 9FT ADLT (ELECTROSURGICAL) ×1
ELECTRODE REM PT RTRN 9FT ADLT (ELECTROSURGICAL) ×1 IMPLANT
GAUZE 4X4 16PLY ~~LOC~~+RFID DBL (SPONGE) ×1 IMPLANT
GAUZE PAD ABD 8X10 STRL (GAUZE/BANDAGES/DRESSINGS) ×1 IMPLANT
GAUZE SPONGE 4X4 12PLY STRL (GAUZE/BANDAGES/DRESSINGS) IMPLANT
GLOVE BIO SURGEON STRL SZ 6.5 (GLOVE) ×1 IMPLANT
GLOVE INDICATOR 6.5 STRL GRN (GLOVE) ×1 IMPLANT
GOWN STRL REUS W/TWL XL LVL3 (GOWN DISPOSABLE) ×1 IMPLANT
KIT SIGMOIDOSCOPE (SET/KITS/TRAYS/PACK) IMPLANT
KIT TURNOVER CYSTO (KITS) ×1 IMPLANT
LEGGING LITHOTOMY PAIR STRL (DRAPES) IMPLANT
NDL HYPO 22X1.5 SAFETY MO (MISCELLANEOUS) ×1 IMPLANT
NDL SAFETY ECLIP 18X1.5 (MISCELLANEOUS) IMPLANT
NEEDLE HYPO 22X1.5 SAFETY MO (MISCELLANEOUS) ×1 IMPLANT
NS IRRIG 500ML POUR BTL (IV SOLUTION) ×1 IMPLANT
PACK BASIN DAY SURGERY FS (CUSTOM PROCEDURE TRAY) ×1 IMPLANT
PENCIL SMOKE EVACUATOR (MISCELLANEOUS) ×1 IMPLANT
SLEEVE SCD COMPRESS KNEE MED (STOCKING) ×1 IMPLANT
SPIKE FLUID TRANSFER (MISCELLANEOUS) ×1 IMPLANT
SPONGE SURGIFOAM ABS GEL 12-7 (HEMOSTASIS) IMPLANT
SUT CHROMIC 2 0 SH (SUTURE) IMPLANT
SUT CHROMIC 3 0 SH 27 (SUTURE) IMPLANT
SYR CONTROL 10ML LL (SYRINGE) ×1 IMPLANT
TOWEL OR 17X24 6PK STRL BLUE (TOWEL DISPOSABLE) ×2 IMPLANT
TRAY DSU PREP LF (CUSTOM PROCEDURE TRAY) ×1 IMPLANT
TRAY FOLEY W/BAG SLVR 14FR LF (SET/KITS/TRAYS/PACK) IMPLANT
TUBE CONNECTING 12X1/4 (SUCTIONS) ×1 IMPLANT
VACUUM HOSE 7/8X10 W/ WAND (MISCELLANEOUS) ×1 IMPLANT
YANKAUER SUCT BULB TIP NO VENT (SUCTIONS) ×1 IMPLANT

## 2022-11-20 NOTE — Transfer of Care (Signed)
Immediate Anesthesia Transfer of Care Note  Patient: Denise Ray  Procedure(s) Performed: LASER ABLATION (Rectum)  Patient Location: PACU  Anesthesia Type:General  Level of Consciousness: drowsy and patient cooperative  Airway & Oxygen Therapy: Patient Spontanous Breathing and Patient connected to face mask oxygen  Post-op Assessment: Report given to RN and Post -op Vital signs reviewed and stable  Post vital signs: Reviewed and stable  Last Vitals:  Vitals Value Taken Time  BP 147/41 11/20/22 1351  Temp    Pulse 96 11/20/22 1356  Resp 24 11/20/22 1356  SpO2 99 % 11/20/22 1356  Vitals shown include unvalidated device data.  Last Pain:  Vitals:   11/20/22 1153  TempSrc: Oral  PainSc: 5       Patients Stated Pain Goal: 8 (11/20/22 1153)  Complications: No notable events documented.

## 2022-11-20 NOTE — Op Note (Signed)
11/20/2022  1:37 PM  PATIENT:  Denise Ray  52 y.o. female  Patient Care Team: Early, Sung Amabile, NP as PCP - General (Nurse Practitioner)  PRE-OPERATIVE DIAGNOSIS:  AIN3  POST-OPERATIVE DIAGNOSIS:  AIN3  PROCEDURE:  LASER ABLATION    Surgeon(s): Romie Levee, MD  ASSISTANT: none   ANESTHESIA:   local and MAC  EBL: 5ml  No intake/output data recorded.  SPECIMEN:  No Specimen  DISPOSITION OF SPECIMEN:  N/A  COUNTS:  YES  PLAN OF CARE: Discharge to home after PACU  PATIENT DISPOSITION:  PACU - hemodynamically stable.  INDICATION: Denise Ray with R lateral anal margin AIN 3 lesion  OR FINDINGS: Discrete, raised R lateral anal margin lesion  DESCRIPTION: The patient was identified in the preoperative holding area and taken to the OR where they were laid prone on the operating room table in jack knife position. MAC anesthesia was smoothly induced.  The patient was then prepped and draped in the usual sterile fashion. A surgical timeout was performed indicating the correct patient, procedure, positioning and preoperative antibioitics. SCDs were noted to be in place and functioning prior to the operation.   A rectal block was performed using Marcaine mixed with Experel.   Next the laser was brought onto the field.  The edges of the operative field were draped with wet towels.  Appropriate ventilation was obtained.  All staff were protected with small particle masks and goggles safe for the laser.  The laser was then activated.The entire lesion was ablated. Hemostasis was then achieved using electrocautery. A thin layer of Silvadene was then placed over the lesions. A sterile dressing was applied over this. The patient was then awakened from anesthesia and sent to the postanesthesia care unit in stable condition. All counts were correct operating room staff.   Vanita Panda, MD  Colorectal and General Surgery [facility] Surgery

## 2022-11-20 NOTE — Anesthesia Preprocedure Evaluation (Addendum)
Anesthesia Evaluation  Patient identified by MRN, date of birth, ID band Patient awake    Reviewed: Allergy & Precautions, NPO status , Patient's Chart, lab work & pertinent test results  Airway Mallampati: III  TM Distance: >3 FB Neck ROM: Full    Dental  (+) Teeth Intact, Dental Advisory Given   Pulmonary asthma , sleep apnea , COPD, Current Smoker and Patient abstained from smoking.   Pulmonary exam normal breath sounds clear to auscultation       Cardiovascular + angina  + CAD and +CHF  Normal cardiovascular exam Rhythm:Regular Rate:Normal     Neuro/Psych  Headaches PSYCHIATRIC DISORDERS Anxiety Depression     Neuromuscular disease    GI/Hepatic Neg liver ROS,GERD  Medicated,,  Endo/Other  diabetes, Type 2, Oral Hypoglycemic AgentsHypothyroidism  Morbid obesity  Renal/GU negative Renal ROS     Musculoskeletal negative musculoskeletal ROS (+)    Abdominal   Peds  Hematology negative hematology ROS (+)   Anesthesia Other Findings Day of surgery medications reviewed with the patient.  Reproductive/Obstetrics                             Anesthesia Physical Anesthesia Plan  ASA: 3  Anesthesia Plan: MAC   Post-op Pain Management: Tylenol PO (pre-op)*   Induction: Intravenous  PONV Risk Score and Plan: 1 and TIVA, Midazolam, Dexamethasone and Ondansetron  Airway Management Planned: Natural Airway and Simple Face Mask  Additional Equipment:   Intra-op Plan:   Post-operative Plan:   Informed Consent: I have reviewed the patients History and Physical, chart, labs and discussed the procedure including the risks, benefits and alternatives for the proposed anesthesia with the patient or authorized representative who has indicated his/her understanding and acceptance.     Dental advisory given  Plan Discussed with: CRNA  Anesthesia Plan Comments:         Anesthesia Quick  Evaluation

## 2022-11-20 NOTE — H&P (Addendum)
PROVIDER:  [name], MD   MRN: [id number] DOB: 08/15/1970 DATE OF ENCOUNTER: 07/13/2022 Subjective      History of Present Illness: Denise Ray is a 52 y.o. female who is seen today for anal lesions. Patient underwent hemorrhoidectomy and removal of anal condyloma in 2016.  Her pathology showed AIN 2 and 3 within the biopsy specimens.  We followed this area due to positive margin.  She also has a h/o abnormal pap smears but the last few have been normal per pt.  In 2017 she noticed that the right anterior lesion has become more bothersome and larger on exam.  She was taken back to the OR for excision of this lesion in early Aug 2017.  Final pathology revealed negative margins, but once again pathology showed AIN 3.  She used Aldara after that.  She was seen in my office and noted to have a pedunculated mass in her anal canal.  I recommended exam under anesthesia and excision.  She had excision of a right posterior perianal polyp in July 2021.  Patient did well with surgery.  She was taken back for exam under anesthesia and removal of condyloma in June 24 and was found to have an anal margin lesion.  We are here today for excision of this lesion.              Past Medical History:  Diagnosis Date   Anal cancer (HCC)     Anal polyp     Anxiety     Arthritis      Right hip   Bladder cancer (HCC)     Bradycardia 05/07/2015   Cervical cancer (HCC)     Chronic congestive heart failure, unspecified heart failure type (HCC) 07/07/2022   Chronic pain      followed by pain clinic   Complication of anesthesia      post op urinary retention   COPD with asthma followed by pcp and as needed [facility]    hx exacerbation's  --- (11-29-2019  per last exacerbation >2 years;  last used rescue inhaler a week ago)   DOE (dyspnea on exertion)      11-29-2019  per pt going flight of stairs but normal daily activites no sob   Family history of adverse reaction to anesthesia       mother -- ponv ;  per pt in 04/ 2021 father had cardiac arrest and Atrial fib intraoperative for circumcision (this was in [place]) put on venitator for two days then had nuclear stress test per pt showed disease but no cath done, still  has atrial fib;  she also stated prior to this he known cardiac disease with no intervention   Former smoker 03/24/2017   GERD (gastroesophageal reflux disease)     Hemorrhoids     History of acute respiratory failure      06/ 2015  and 12/ 2016  CAP and Asthma exacerbation--  vented both times (ARDS)   History of anal dysplasia      AIN 2/ 3     History of cervical dysplasia      CIN 2 in 2000   History of chest pain      11-29-2019 per pt had cardiology evaluation for chest pain (but pt stated has not had any chest pain or cardiac symtpoms since) referred by pcp  w/ dr Eden Emms note in epic 05-22-2019;  pt had nuclear stress test the showed normal perfusion no ischemia and  ef >65% ,  also had echo same day showed normal w/ ef 65%   History of hypothyroidism     History of pneumonia      hx Required ventilatory support   History of respiratory failure 10/31/2013   History of TIA (transient ischemic attack)      Caused by medication reaction with combination of wellbutrin and estrogen   Migraines     OSA on CPAP      11-29-2019 pt stated uses every night   Type 2 diabetes mellitus (HCC)      followed by pcp  (11-29-2019 pt stated does not check blood sugar)   Wears glasses               Past Surgical History:  Procedure Laterality Date   ABDOMINAL HYSTERECTOMY       CESAREAN SECTION   2001   CO2 LASER APPLICATION N/A 10/11/2014    Procedure: CO2 LASER APPLICATION;  Surgeon: Romie Levee, MD;  Location: [facility];  Service: General;  Laterality: N/A;   CONIZATION OF CERVIX   2000   DILATION AND CURETTAGE OF UTERUS       EVALUATION UNDER ANESTHESIA WITH HEMORRHOIDECTOMY N/A 06/15/2014    Procedure: EXAM UNDER ANESTHESIA WITH  HEMORRHOIDECTOMY;  Surgeon: Frederik Schmidt, MD;  Location: [facility];  Service: General;  Laterality: N/A;   EXCISION OF SKIN TAG N/A 06/15/2014    Procedure: EXCISION OF SKIN TAGS;  Surgeon: Frederik Schmidt, MD;  Location: [facility];  Service: General;  Laterality: N/A;   HIGH RESOLUTION ANOSCOPY N/A 10/11/2014    Procedure: HIGH RESOLUTION ANOSCOPY WITH BIOPSY;  Surgeon: Romie Levee, MD;  Location: [facility] [place];  Service: General;  Laterality: N/A;   HYSTEROSCOPY WITH D & C   09/12/2007  &  2005   MASS EXCISION N/A 12/07/2019    Procedure: EXCISION OF ANAL POLYP AND PERIANAL MOLE;  Surgeon: Romie Levee, MD;  Location: [facility];  Service: General;  Laterality: N/A;   RECTAL EXAM UNDER ANESTHESIA N/A 10/11/2014    Procedure: ANAL EXAM UNDER ANESTHESIA;  Surgeon: Romie Levee, MD;  Location: [facility];  Service: General;  Laterality: N/A;   RECTAL EXAM UNDER ANESTHESIA N/A 12/18/2015    Procedure: ANAL EXAM UNDER ANESTHESIA  EXCISION ANAL MARGIN LESION;  Surgeon: Romie Levee, MD;  Location: [facility];  Service: General;  Laterality: N/A;   RECTAL EXAM UNDER ANESTHESIA N/A 12/07/2019    Procedure: ANAL EXAM UNDER ANESTHESIA;  Surgeon: Romie Levee, MD;  Location: [facility] [place];  Service: General;  Laterality: N/A;   ROBOTIC ASSISTED TOTAL HYSTERECTOMY N/A 10/11/2012    Procedure: ROBOTIC ASSISTED TOTAL HYSTERECTOMY;  Surgeon: Annamaria Boots, MD;  Location: [facility];  Service: Gynecology;  Laterality: N/A;   SALPINGOOPHORECTOMY Bilateral 10/11/2012    Procedure: SALPINGO OOPHORECTOMY;  Surgeon: Annamaria Boots, MD;  Location: [facility];  Service: Gynecology;  Laterality: Bilateral;   TRANSTHORACIC ECHOCARDIOGRAM   05/07/2015    lvsf vigorous, ef 70-75%/  trivial MR and TR   VIDEO ASSISTED THORACOSCOPY (VATS)/THOROCOTOMY Right 11/04/2001    w/ Resection duplication esophageal cyst   . Social History             Socioeconomic History   Marital status: Divorced      Spouse name: Not on file   Number of children: 1   Years of education: Not on file   Highest education  level: Not on file  Occupational History   Occupation: Respiratory Therapist      Employer: [employer]  Tobacco Use   Smoking status: Every Day      Packs/day: 1.00      Years: 30.00      Total pack years: 30.00      Types: Cigarettes   Smokeless tobacco: Never  Vaping Use   Vaping Use: Never used  Substance and Sexual Activity   Alcohol use: Not Currently      Alcohol/week: 0.0 standard drinks of alcohol      Comment: Rarely   Drug use: Never   Sexual activity: Yes      Partners: Male      Birth control/protection: Surgical      Comment: Hysterectomy  Other Topics Concern   Not on file  Social History Narrative    Divorced.  Mother and son live with her.    Social Determinants of Health    Financial Resource Strain: Not on file  Food Insecurity: Not on file  Transportation Needs: Not on file  Physical Activity: Not on file  Stress: Not on file  Social Connections: Not on file  Intimate Partner Violence: Not on file             Family History  Problem Relation Age of Onset   Breast cancer Brother 93   Breast cancer Maternal Aunt     Depression Mother     Anesthesia problems Mother          post-op N/V   Alcohol abuse Father     Alcohol abuse Cousin        Current Outpatient Medications:    albuterol (PROVENTIL HFA;VENTOLIN HFA) 108 (90 BASE) MCG/ACT inhaler, Inhale 2 puffs into the lungs every 6 (six) hours as needed. wheezing, Disp: 1 Inhaler, Rfl: 11   ALPRAZolam (XANAX) 1 MG tablet, Take 1 tablet (1 mg total) by mouth 2 (two) times daily as needed., Disp: 60 tablet, Rfl: 0   Ascorbic Acid (VITAMIN C) 1000 MG tablet, Take 2 (two) Tablet by mouth daily, Disp: 20 tablet, Rfl: 0   aspirin EC 81 MG tablet, Take 81 mg by mouth daily., Disp: , Rfl:     Cholecalciferol (VITAMIN D) 50 MCG (2000 UT) tablet, Take 1 (one) Tablet by mouth daily, Disp: 10 tablet, Rfl: 0   famotidine (PEPCID) 20 MG tablet, Take 20 mg by mouth at bedtime. , Disp: , Rfl:    gabapentin (NEURONTIN) 100 MG capsule, Take 2 capsules (200 mg total) by mouth 4 (four) times daily., Disp: 240 capsule, Rfl: 5   HYDROcodone-acetaminophen (NORCO) 10-325 MG tablet, Take 1 tablet by mouth every 6 (six) hours as needed for pain., Disp: 120 tablet, Rfl: 0   HYDROcodone-acetaminophen (NORCO) 10-325 MG tablet, Take 1 tablet by mouth every 6 (six) hours as needed for pain., Disp: 120 tablet, Rfl: 0   HYDROcodone-acetaminophen (NORCO/VICODIN) 5-325 MG tablet, Take 1 tablet by mouth every 6 (six) hours as needed for pain (Patient not taking: Reported on 07/07/2022), Disp: 120 tablet, Rfl: 0   levothyroxine (SYNTHROID) 50 MCG tablet, Take 1 tablet (50 mcg total) by mouth daily., Disp: 90 tablet, Rfl: 3   loratadine (CLARITIN) 10 MG tablet, Take 10 mg by mouth at bedtime. , Disp: , Rfl:    metFORMIN (GLUCOPHAGE) 850 MG tablet, Take 1 tablet (850 mg total) by mouth 2 (two) times daily., Disp: 180 tablet, Rfl: 3   Multiple Vitamins-Calcium (ONE-A-DAY  WOMENS FORMULA PO), Take by mouth daily. , Disp: , Rfl:    nystatin (MYCOSTATIN/NYSTOP) powder, Apply 1 Application topically 2 (two) times daily. Apply to affected area for up to 7 days, Disp: 15 g, Rfl: 2   nystatin cream (MYCOSTATIN), Apply topically 2 (two) times daily. Apply nightly as needed for treatment of candida, Disp: 30 g, Rfl: 0   Omega-3 Fatty Acids (OMEGA-3 FISH OIL PO), Take 720 mg by mouth in the morning and at bedtime., Disp: , Rfl:    ondansetron (ZOFRAN) 4 MG tablet, Take 1 tablet (4 mg total) by mouth every 8 (eight) hours as needed for nausea or vomiting., Disp: 30 tablet, Rfl: 2   pantoprazole (PROTONIX) 40 MG tablet, Take 1 tablet (40 mg total) by mouth daily., Disp: 90 tablet, Rfl: 3   rosuvastatin (CRESTOR) 20 MG tablet, Take 1  tablet (20 mg total) by mouth at bedtime., Disp: 90 tablet, Rfl: 3   Semaglutide, 1 MG/DOSE, (OZEMPIC, 1 MG/DOSE,) 4 MG/3ML SOPN, Inject 1 mg into the skin every 7 (seven) days., Disp: 3 mL, Rfl: 5   venlafaxine XR (EFFEXOR-XR) 150 MG 24 hr capsule, Take 1 capsule (150 mg total) by mouth daily., Disp: 90 capsule, Rfl: 3          Allergies  Allergen Reactions   Oseltamivir Phosphate Other (See Comments) and Cough      BRONCHOCONSTRICTION   Estrogens Other (See Comments)      SYMPTOMS OF A STROKE   Wellbutrin [Bupropion] Other (See Comments)      CAUSED TIA WHEN TAKEN WITH ESTROGEN   Ibuprofen Other (See Comments)      Avoids due to nosebleeds   Other        Bitrex, used in FIT testing    Morphine And Related Other (See Comments)      DOES NOT RELIEVE PAIN    Review of Systems - Negative except as stated above       Objective:      BP (!) 147/73   Pulse 75   Temp 98.1 F (36.7 C) (Oral)   Resp 16   Ht 5\' 5"  (1.651 m)   Wt 109.8 kg   SpO2 95%   BMI 40.27 kg/m   General appearance - alert, well appearing, and in no distress  CV: RRR Lungs: CTA Abd: soft            Assessment and Plan:  Diagnoses and all orders for this visit:   Anal condyloma      repeat exam in 3 months given symptoms   52 year old female with history of multiple AIN grade 3 lesions perianally.  She also had a AIN grade 3 pigmented lesion.  She has a high grade anal margin lesion that needs to be removed.  We will do that today.  Patient has a history of urinary retention after surgery and will most likely need a Foley catheter upon discharge after surgery.  She can remove this herself at home 2 days after surgery.   No follow-ups on file.       Vanita Panda, MD Colon and Rectal Surgery [facility]

## 2022-11-20 NOTE — Anesthesia Procedure Notes (Signed)
Procedure Name: Intubation Date/Time: 11/20/2022 1:09 PM  Performed by: Bishop Limbo, CRNAPre-anesthesia Checklist: Patient identified, Emergency Drugs available, Suction available and Patient being monitored Patient Re-evaluated:Patient Re-evaluated prior to induction Oxygen Delivery Method: Circle System Utilized Preoxygenation: Pre-oxygenation with 100% oxygen Induction Type: IV induction Ventilation: Mask ventilation without difficulty Laryngoscope Size: Mac and 3 Grade View: Grade I Tube type: Oral Tube size: 7.0 mm Number of attempts: 1 Airway Equipment and Method: Stylet and Bite block Placement Confirmation: ETT inserted through vocal cords under direct vision, positive ETCO2 and breath sounds checked- equal and bilateral Secured at: 22 cm Tube secured with: Tape Dental Injury: Teeth and Oropharynx as per pre-operative assessment

## 2022-11-20 NOTE — Discharge Instructions (Addendum)
Post Operative Instructions Following Laser Surgery  Laser treatment of condyloma (warts) is used to vaporize or eliminate the wart.  The laser actually creates a burn effect on the skin to accomplish this.  The following instructions will help in you comfort postoperatively:  First 24 h Remove the dressing this evening after you return home from the hospital Sit in a tub or sitz bath of COOL water for 20 minutes every hour while awake.  After the bath, carefully blot the area dry and place a piece of 100% Cotton with Silvadene on it next to the anal opening.  You may sit on a covered ice pack between the baths as needed.   You should have crushed ice in small amounts until you are able to urinate.  After you urinate, you may drink fluids.  It is normal to not urinate for the first few hours after surgery.  If you become uncomfortable, call the office for instructions.  Take your pain medications as prescribed You may eat whatever you feel like.  Start with a light meal and gradually advance your diet.    Beginning the day after your surgery Sit in a tub of cool to warm water for 15 minutes at least 3 times a day and after bowel movements After the bowel movement, clean with wet cotton, Tucks or baby wipes. Apply Silvadene to a thin piece of cotton and place over the anal opening after your baths.  This will collect any drainage or bleeding.  Drainage is usually a pink to tan color and is normal for the following 2-3 weeks after surgery.  Change to cotton ever 2-3 hours while awake.  Diet Eat a regular diet.  Avoid foods that may constipate you or give you diarrhea.  Avoid foods with seeds, nuts, corn or popcorn. Beginning the day after surgery, drink 6-8 glasses of water a day in addition to your meals.  Limit you caffeine intake to 1-2 servings per day.  Medication Take a fiber supplement (Metamucil, Citrucel, FiberCon) twice a day. Take a stool softener like Colace twice daily Take your  pain medication as directed.  You may use Extra Strength Tylenol instead of your prescribed pain medication to relieve mild discomfort.  Avoid products containing aspirin.  Bowel Habits You should have a bowel movement at least every other day.  If you are constipated, you may take a Fleet enema or 2 Dulcolax tablets.  Call the office if no results occur. You may bear down a normal amount to have a bowel movement without hurting your tissues after the operation.  Activity Walking is encouraged.  You may drive when you are no longer on prescription pain medication.  You may go up and down stairs carefully. No heavy lifting or strenuous activity until after your first post operative appointment Do NOT sit on a rubber ring; instead use a soft pillow if needed.  Call the office if you have any questions or concerns.  Call IMMEDIATELY if you develop: Rectal bleeding Increasing rectal pain Fever greater than 100 F Difficulty urinating         No acetaminophen/Tylenol until after 6:25 pm today if needed.   Post Anesthesia Home Care Instructions  Activity: Get plenty of rest for the remainder of the day. A responsible individual must stay with you for 24 hours following the procedure.  For the next 24 hours, DO NOT: -Drive a car -Advertising copywriter -Drink alcoholic beverages -Take any medication unless instructed by your physician -Make  any legal decisions or sign important papers.  Meals: Start with liquid foods such as gelatin or soup. Progress to regular foods as tolerated. Avoid greasy, spicy, heavy foods. If nausea and/or vomiting occur, drink only clear liquids until the nausea and/or vomiting subsides. Call your physician if vomiting continues.  Special Instructions/Symptoms: Your throat may feel dry or sore from the anesthesia or the breathing tube placed in your throat during surgery. If this causes discomfort, gargle with warm salt water. The discomfort should disappear  within 24 hours.      Information for Discharge Teaching: EXPAREL (bupivacaine liposome injectable suspension)   Your surgeon or anesthesiologist gave you EXPAREL(bupivacaine) to help control your pain after surgery.  EXPAREL is a local anesthetic that provides pain relief by numbing the tissue around the surgical site. EXPAREL is designed to release pain medication over time and can control pain for up to 72 hours. Depending on how you respond to EXPAREL, you may require less pain medication during your recovery.  Possible side effects: Temporary loss of sensation or ability to move in the area where bupivacaine was injected. Nausea, vomiting, constipation Rarely, numbness and tingling in your mouth or lips, lightheadedness, or anxiety may occur. Call your doctor right away if you think you may be experiencing any of these sensations, or if you have other questions regarding possible side effects.  Follow all other discharge instructions given to you by your surgeon or nurse. Eat a healthy diet and drink plenty of water or other fluids.  If you return to the hospital for any reason within 96 hours following the administration of EXPAREL, it is important for health care providers to know that you have received this anesthetic. A teal colored band has been placed on your arm with the date, time and amount of EXPAREL you have received in order to alert and inform your health care providers. Please leave this armband in place for the full 96 hours following administration, and then you may remove the band.

## 2022-11-23 ENCOUNTER — Encounter (HOSPITAL_BASED_OUTPATIENT_CLINIC_OR_DEPARTMENT_OTHER): Payer: Self-pay | Admitting: General Surgery

## 2022-11-23 NOTE — Anesthesia Postprocedure Evaluation (Signed)
Anesthesia Post Note  Patient: Rubye Oaks  Procedure(s) Performed: LASER ABLATION (Rectum)     Patient location during evaluation: PACU Anesthesia Type: General Level of consciousness: awake and alert Pain management: pain level controlled Vital Signs Assessment: post-procedure vital signs reviewed and stable Respiratory status: spontaneous breathing, nonlabored ventilation, respiratory function stable and patient connected to nasal cannula oxygen Cardiovascular status: blood pressure returned to baseline and stable Postop Assessment: no apparent nausea or vomiting Anesthetic complications: no   No notable events documented.  Last Vitals:  Vitals:   11/20/22 1430 11/20/22 1450  BP: (!) 124/53 (!) 137/56  Pulse: 84 84  Resp: 14 15  Temp:  36.7 C  SpO2: 91% 94%    Last Pain:  Vitals:   11/20/22 1450  TempSrc:   PainSc: 0-No pain                 Collene Schlichter

## 2022-12-29 ENCOUNTER — Other Ambulatory Visit: Payer: Self-pay | Admitting: Nurse Practitioner

## 2022-12-29 ENCOUNTER — Other Ambulatory Visit (HOSPITAL_COMMUNITY): Payer: Self-pay

## 2022-12-29 DIAGNOSIS — G893 Neoplasm related pain (acute) (chronic): Secondary | ICD-10-CM

## 2022-12-29 NOTE — Telephone Encounter (Signed)
Last apt 10/19/22.

## 2022-12-30 ENCOUNTER — Other Ambulatory Visit: Payer: Self-pay

## 2022-12-31 MED ORDER — HYDROCODONE-ACETAMINOPHEN 10-325 MG PO TABS
1.0000 | ORAL_TABLET | Freq: Four times a day (QID) | ORAL | 0 refills | Status: DC | PRN
Start: 2022-12-31 — End: 2023-01-28
  Filled 2022-12-31: qty 120, 30d supply, fill #0

## 2023-01-01 ENCOUNTER — Other Ambulatory Visit (HOSPITAL_COMMUNITY): Payer: Self-pay

## 2023-01-01 ENCOUNTER — Other Ambulatory Visit: Payer: Self-pay

## 2023-01-25 ENCOUNTER — Encounter: Payer: Commercial Managed Care - PPO | Admitting: Nurse Practitioner

## 2023-01-28 ENCOUNTER — Other Ambulatory Visit: Payer: Self-pay | Admitting: Nurse Practitioner

## 2023-01-28 ENCOUNTER — Other Ambulatory Visit: Payer: Self-pay

## 2023-01-28 ENCOUNTER — Other Ambulatory Visit (HOSPITAL_COMMUNITY): Payer: Self-pay

## 2023-01-28 DIAGNOSIS — G893 Neoplasm related pain (acute) (chronic): Secondary | ICD-10-CM

## 2023-01-28 MED ORDER — HYDROCODONE-ACETAMINOPHEN 10-325 MG PO TABS
1.0000 | ORAL_TABLET | Freq: Four times a day (QID) | ORAL | 0 refills | Status: AC | PRN
Start: 2023-01-28 — End: ?
  Filled 2023-01-28: qty 120, 30d supply, fill #0

## 2023-01-28 NOTE — Telephone Encounter (Signed)
Last apt 10/20/22 next apt 02/16/23

## 2023-01-29 ENCOUNTER — Other Ambulatory Visit: Payer: Self-pay

## 2023-02-16 ENCOUNTER — Encounter: Payer: Self-pay | Admitting: Nurse Practitioner

## 2023-02-16 ENCOUNTER — Other Ambulatory Visit (HOSPITAL_COMMUNITY): Payer: Self-pay

## 2023-02-16 ENCOUNTER — Other Ambulatory Visit: Payer: Self-pay

## 2023-02-16 ENCOUNTER — Ambulatory Visit: Payer: Commercial Managed Care - PPO | Admitting: Nurse Practitioner

## 2023-02-16 VITALS — BP 124/80 | HR 70 | Wt 247.8 lb

## 2023-02-16 DIAGNOSIS — I25709 Atherosclerosis of coronary artery bypass graft(s), unspecified, with unspecified angina pectoris: Secondary | ICD-10-CM

## 2023-02-16 DIAGNOSIS — J449 Chronic obstructive pulmonary disease, unspecified: Secondary | ICD-10-CM | POA: Diagnosis not present

## 2023-02-16 DIAGNOSIS — IMO0001 Reserved for inherently not codable concepts without codable children: Secondary | ICD-10-CM

## 2023-02-16 DIAGNOSIS — F411 Generalized anxiety disorder: Secondary | ICD-10-CM

## 2023-02-16 DIAGNOSIS — E662 Morbid (severe) obesity with alveolar hypoventilation: Secondary | ICD-10-CM

## 2023-02-16 DIAGNOSIS — G4733 Obstructive sleep apnea (adult) (pediatric): Secondary | ICD-10-CM

## 2023-02-16 DIAGNOSIS — F4310 Post-traumatic stress disorder, unspecified: Secondary | ICD-10-CM

## 2023-02-16 DIAGNOSIS — G893 Neoplasm related pain (acute) (chronic): Secondary | ICD-10-CM

## 2023-02-16 DIAGNOSIS — G629 Polyneuropathy, unspecified: Secondary | ICD-10-CM

## 2023-02-16 DIAGNOSIS — E039 Hypothyroidism, unspecified: Secondary | ICD-10-CM

## 2023-02-16 DIAGNOSIS — E66812 Obesity, class 2: Secondary | ICD-10-CM | POA: Diagnosis not present

## 2023-02-16 DIAGNOSIS — E1169 Type 2 diabetes mellitus with other specified complication: Secondary | ICD-10-CM | POA: Diagnosis not present

## 2023-02-16 DIAGNOSIS — F3342 Major depressive disorder, recurrent, in full remission: Secondary | ICD-10-CM | POA: Diagnosis not present

## 2023-02-16 DIAGNOSIS — D013 Carcinoma in situ of anus and anal canal: Secondary | ICD-10-CM

## 2023-02-16 DIAGNOSIS — F17200 Nicotine dependence, unspecified, uncomplicated: Secondary | ICD-10-CM

## 2023-02-16 DIAGNOSIS — K219 Gastro-esophageal reflux disease without esophagitis: Secondary | ICD-10-CM

## 2023-02-16 MED ORDER — ALPRAZOLAM 1 MG PO TABS
1.0000 mg | ORAL_TABLET | Freq: Two times a day (BID) | ORAL | 2 refills | Status: DC | PRN
Start: 2023-02-16 — End: 2023-06-03
  Filled 2023-02-16 – 2023-03-01 (×2): qty 60, 30d supply, fill #0
  Filled 2023-04-01: qty 60, 30d supply, fill #1
  Filled 2023-04-30 (×2): qty 60, 30d supply, fill #2

## 2023-02-16 MED ORDER — GABAPENTIN 100 MG PO CAPS
300.0000 mg | ORAL_CAPSULE | Freq: Four times a day (QID) | ORAL | 11 refills | Status: DC
Start: 2023-02-16 — End: 2024-03-04
  Filled 2023-02-16 – 2023-03-01 (×2): qty 360, 30d supply, fill #0
  Filled 2023-04-01: qty 360, 30d supply, fill #1
  Filled 2023-04-30 (×2): qty 360, 30d supply, fill #2
  Filled 2023-06-03: qty 360, 30d supply, fill #3
  Filled 2023-07-01: qty 360, 30d supply, fill #4
  Filled 2023-08-03: qty 360, 30d supply, fill #5
  Filled 2023-09-02: qty 360, 30d supply, fill #6
  Filled 2023-10-04: qty 360, 30d supply, fill #7
  Filled 2023-11-09: qty 360, 30d supply, fill #8
  Filled 2023-12-08 (×2): qty 360, 30d supply, fill #9
  Filled 2024-01-07: qty 360, 30d supply, fill #10
  Filled 2024-02-09: qty 360, 30d supply, fill #11

## 2023-02-16 MED ORDER — PANTOPRAZOLE SODIUM 40 MG PO TBEC
40.0000 mg | DELAYED_RELEASE_TABLET | Freq: Every day | ORAL | 3 refills | Status: AC
Start: 2023-02-16 — End: ?
  Filled 2023-02-16 – 2023-04-01 (×2): qty 90, 90d supply, fill #0
  Filled 2023-07-01: qty 90, 90d supply, fill #1
  Filled 2024-02-09: qty 90, 90d supply, fill #2

## 2023-02-16 MED ORDER — HYDROCODONE-ACETAMINOPHEN 10-325 MG PO TABS
1.0000 | ORAL_TABLET | Freq: Four times a day (QID) | ORAL | 0 refills | Status: DC | PRN
Start: 2023-02-16 — End: 2023-04-01
  Filled 2023-02-16 – 2023-03-01 (×2): qty 120, 30d supply, fill #0

## 2023-02-16 MED ORDER — VENLAFAXINE HCL ER 150 MG PO CP24
150.0000 mg | ORAL_CAPSULE | Freq: Every day | ORAL | 3 refills | Status: DC
Start: 2023-02-16 — End: 2023-08-26
  Filled 2023-02-16: qty 90, 90d supply, fill #0

## 2023-02-16 NOTE — Progress Notes (Unsigned)
  Shawna Clamp, DNP, AGNP-c [facility]  [address] [place], [place] [address] [phone]  ESTABLISHED PATIENT- Chronic Health and/or Follow-Up Visit  There were no vitals taken for this visit.    Denise Ray is a 52 y.o. year old female presenting today for evaluation and management of chronic conditions.   ***  All ROS negative with exception of what is listed above.   PHYSICAL EXAM Physical Exam  PLAN Problem List Items Addressed This Visit   None   No follow-ups on file.  Time: *** minutes, >50% spent counseling, care coordination, chart review, and documentation.   Shawna Clamp, DNP, AGNP-c

## 2023-02-16 NOTE — Patient Instructions (Addendum)
If your A1c comes back high we will plan to start [facility] and see if this is better tolerated.   Let me know if the increased gabapentin is helpful.

## 2023-02-17 LAB — COMPREHENSIVE METABOLIC PANEL
ALT: 16 IU/L (ref 0–32)
AST: 19 IU/L (ref 0–40)
Albumin: 4.1 g/dL (ref 3.9–4.9)
Alkaline Phosphatase: 109 IU/L (ref 44–121)
BUN/Creatinine Ratio: 18 (ref 9–23)
BUN: 11 mg/dL (ref 6–24)
CO2: 22 mmol/L (ref 20–29)
Calcium: 9.9 mg/dL (ref 8.7–10.2)
Chloride: 101 mmol/L (ref 96–106)
Creatinine, Ser: 0.6 mg/dL (ref 0.57–1.00)
Globulin, Total: 3 g/dL (ref 1.5–4.5)
Glucose: 141 mg/dL — ABNORMAL HIGH (ref 70–99)
Potassium: 4.9 mmol/L (ref 3.5–5.2)
Sodium: 140 mmol/L (ref 134–144)
Total Protein: 7.1 g/dL (ref 6.0–8.5)
eGFR: 109 mL/min/{1.73_m2} (ref >59)

## 2023-02-17 LAB — LIPID PANEL
Cholesterol, Total: 207 mg/dL — ABNORMAL HIGH (ref 100–199)
HDL: 34 mg/dL — ABNORMAL LOW (ref >39)
LDL CALC COMMENT:: 6.1 ratio — ABNORMAL HIGH (ref 0.0–4.4)
LDL Chol Calc (NIH): 90 mg/dL (ref 0–99)
Triglycerides: 506 mg/dL — ABNORMAL HIGH (ref 0–149)
VLDL Cholesterol Cal: 83 mg/dL — ABNORMAL HIGH (ref 5–40)

## 2023-02-17 LAB — CBC WITH DIFFERENTIAL/PLATELET
Basophils Absolute: 0 10*3/uL (ref 0.0–0.2)
Basos: 1 %
EOS (ABSOLUTE): 0.1 10*3/uL (ref 0.0–0.4)
Eos: 2 %
Hematocrit: 45.3 % (ref 34.0–46.6)
Hemoglobin: 14.9 g/dL (ref 11.1–15.9)
Immature Grans (Abs): 0.1 10*3/uL (ref 0.0–0.1)
Immature Granulocytes: 1 %
Lymphocytes Absolute: 2 10*3/uL (ref 0.7–3.1)
Lymphs: 33 %
MCH: 30.4 pg (ref 26.6–33.0)
MCHC: 32.9 g/dL (ref 31.5–35.7)
MCV: 92 fL (ref 79–97)
Monocytes Absolute: 0.6 10*3/uL (ref 0.1–0.9)
Monocytes: 9 %
Neutrophils Absolute: 3.3 10*3/uL (ref 1.4–7.0)
Neutrophils: 54 %
Platelets: 311 10*3/uL (ref 150–450)
RBC: 4.9 x10E6/uL (ref 3.77–5.28)
RDW: 13.8 % (ref 11.7–15.4)
WBC: 6.1 10*3/uL (ref 3.4–10.8)

## 2023-02-17 LAB — HEMOGLOBIN A1C
Est. average glucose Bld gHb Est-mCnc: 154 mg/dL
Hgb A1c MFr Bld: 7 % — ABNORMAL HIGH (ref 4.8–5.6)

## 2023-02-17 LAB — T4, FREE: Free T4: 0.83 ng/dL (ref 0.82–1.77)

## 2023-02-17 LAB — VITAMIN D 25 HYDROXY (VIT D DEFICIENCY, FRACTURES): Vit D, 25-Hydroxy: 24.7 ng/mL — ABNORMAL LOW (ref 30.0–100.0)

## 2023-02-17 LAB — TSH: TSH: 4.38 u[IU]/mL (ref 0.450–4.500)

## 2023-02-18 DIAGNOSIS — G629 Polyneuropathy, unspecified: Secondary | ICD-10-CM | POA: Insufficient documentation

## 2023-02-18 DIAGNOSIS — F411 Generalized anxiety disorder: Secondary | ICD-10-CM | POA: Insufficient documentation

## 2023-02-18 NOTE — Assessment & Plan Note (Signed)
Chronic. No alarm symptoms present at this time. Will monitor. Continue pantoprazole with PRN famotidine for breakthrough symptoms.

## 2023-02-18 NOTE — Assessment & Plan Note (Signed)
Reports pain and numbness in feet, likely secondary to diabetes, but exact etiology is unclear. Gabapentin 200mg  providing some relief but patient reports balance issues and a recent fall due to symptoms or burning and pain. Pedal pulses and coloration WNL. No edema. Sensation diminished. Gabapentin has historically been too sedating to increase the dose.  -Increase Gabapentin to 300mg  four times a day. Monitor for tolerance and efficacy. -If sedation is an issue, consider increasing bedtime dose to see if this helps

## 2023-02-18 NOTE — Assessment & Plan Note (Signed)
Patient not consistently using CPAP due to recurrent upper airway infections. -Monitor for symptoms of sleep apnea. - Consider re-evaluation for new/different machine to see if this would help reduce the symptoms.

## 2023-02-18 NOTE — Assessment & Plan Note (Signed)
Patient reports good control of symptoms. Previous intolerance to combined ICS and albuterol due to thrush. -Continue current management with albuterol.

## 2023-02-18 NOTE — Assessment & Plan Note (Signed)
her reported pain is consistent with the problem for which treatment is required.  she denies addiction to pain medication. she denies abusing the pain medication or using it for a manner in which it is not prescribed. she denies supplying others with her pain medication. she denies any side effects from the pain medication.  she is taking  3-4  doses of Hydrocodone-Acetaminophen (Vicodin) 10-325mg  daily.  her pain level is 10/10 at its worst and unknown at its best.  The risks and benefits of ongoing opiate pain medication have been reviewed with the patient. I do believe the benefits of the medication prescribed for this patient outweigh the risks at this time. she knows that she can contact me at any point to discuss alternative treatment options, risks, benefits, or other concerns associated with her pain management.  The PDMP has been reviewed today with no concerning findings.

## 2023-02-18 NOTE — Assessment & Plan Note (Signed)
HPV related anal cancer. Underwent two surgeries for removal of external and internal tumors over the summer. Currently in recovery with follow-up exam scheduled for 03/16/2023. Patient prefers surgical intervention over chemo and radiation. Discussed potential for colostomy but patient is not ready for this step at this time. I will continue to provide supportive care and offer management for pain control related to cancer.  -Continue current management and follow-up as planned. -Pain medication refilled today.

## 2023-02-18 NOTE — Assessment & Plan Note (Signed)
Chronic. Not currently taking any medication, but historically has been on levothyroxine. No reported symptoms. Will manage labs today. - If your labs come back abnormal, we can discuss treatment options.

## 2023-02-18 NOTE — Assessment & Plan Note (Addendum)
DM2 in the setting of HTN, OSA, BMI >41. Patient currently not on any medication for diabetes. Reports previous intolerance to Ozempic due to gastrointestinal side effects and muscle aches. Unclear of blood sugar levels, but reports that they were normal during recent surgical interventions. We will monitor labs today.  -Check blood glucose and A1C. If elevated, consider starting Mounjaro. -If mounjaro started, consider adding daily laxative to ensure that bowel evacuation is not painful or delayed.

## 2023-02-18 NOTE — Assessment & Plan Note (Signed)
Chronic. Stable. No alarm symptoms are present today. Currently managed with venlafaxine and tolerating well. No changes to medication or treatment today.  - Continue your current treatment.  - If you have new or worsening symptoms, let me know immediately.

## 2023-02-18 NOTE — Assessment & Plan Note (Signed)
Recommend cessation. Currently not ready to quit. Will continue to provide support.

## 2023-02-18 NOTE — Assessment & Plan Note (Signed)
Patient reports occasional breakdowns and high stress due to medical condition and personal responsibilities. -Continue current management with Xanax. Monitor mood and adjust treatment as necessary.

## 2023-02-18 NOTE — Assessment & Plan Note (Addendum)
Chronic and stable at this time with management with daily venlafaxine and PRN xanax. PDMP reviewed with no concerns present at this time.  - Continue your current treatment - Notify me immediately if you begin to have worsening or new symptoms.

## 2023-02-26 ENCOUNTER — Other Ambulatory Visit (HOSPITAL_COMMUNITY): Payer: Self-pay

## 2023-03-01 ENCOUNTER — Other Ambulatory Visit: Payer: Self-pay

## 2023-03-08 ENCOUNTER — Other Ambulatory Visit: Payer: Self-pay | Admitting: Nurse Practitioner

## 2023-03-08 ENCOUNTER — Other Ambulatory Visit (HOSPITAL_COMMUNITY): Payer: Self-pay

## 2023-03-08 DIAGNOSIS — E559 Vitamin D deficiency, unspecified: Secondary | ICD-10-CM

## 2023-03-08 MED ORDER — VITAMIN D (ERGOCALCIFEROL) 1.25 MG (50000 UNIT) PO CAPS
50000.0000 [IU] | ORAL_CAPSULE | ORAL | 3 refills | Status: DC
  Filled 2023-03-08: qty 12, 84d supply, fill #0
  Filled 2023-06-03: qty 12, 84d supply, fill #1
  Filled 2023-10-04: qty 12, 84d supply, fill #2

## 2023-03-09 ENCOUNTER — Other Ambulatory Visit (HOSPITAL_COMMUNITY): Payer: Self-pay

## 2023-03-22 ENCOUNTER — Encounter: Payer: Self-pay | Admitting: Nurse Practitioner

## 2023-04-01 ENCOUNTER — Other Ambulatory Visit: Payer: Self-pay | Admitting: Nurse Practitioner

## 2023-04-01 ENCOUNTER — Other Ambulatory Visit (HOSPITAL_COMMUNITY): Payer: Self-pay

## 2023-04-01 ENCOUNTER — Other Ambulatory Visit: Payer: Self-pay

## 2023-04-01 DIAGNOSIS — G893 Neoplasm related pain (acute) (chronic): Secondary | ICD-10-CM

## 2023-04-01 NOTE — Telephone Encounter (Signed)
Last apt 02/16/23

## 2023-04-02 ENCOUNTER — Other Ambulatory Visit: Payer: Self-pay

## 2023-04-02 ENCOUNTER — Other Ambulatory Visit (HOSPITAL_COMMUNITY): Payer: Self-pay

## 2023-04-02 MED ORDER — HYDROCODONE-ACETAMINOPHEN 10-325 MG PO TABS
1.0000 | ORAL_TABLET | Freq: Four times a day (QID) | ORAL | 0 refills | Status: DC | PRN
Start: 2023-04-02 — End: 2023-04-30
  Filled 2023-04-02: qty 120, 30d supply, fill #0

## 2023-04-30 ENCOUNTER — Other Ambulatory Visit (HOSPITAL_COMMUNITY): Payer: Self-pay

## 2023-04-30 ENCOUNTER — Other Ambulatory Visit: Payer: Self-pay | Admitting: Nurse Practitioner

## 2023-04-30 ENCOUNTER — Other Ambulatory Visit: Payer: Self-pay

## 2023-04-30 DIAGNOSIS — G893 Neoplasm related pain (acute) (chronic): Secondary | ICD-10-CM

## 2023-04-30 NOTE — Telephone Encounter (Signed)
 Last apt 02/16/23

## 2023-05-04 ENCOUNTER — Other Ambulatory Visit (HOSPITAL_COMMUNITY): Payer: Self-pay

## 2023-05-04 MED ORDER — HYDROCODONE-ACETAMINOPHEN 10-325 MG PO TABS
1.0000 | ORAL_TABLET | Freq: Four times a day (QID) | ORAL | 0 refills | Status: DC | PRN
Start: 2023-05-04 — End: 2023-06-03
  Filled 2023-05-04: qty 120, 30d supply, fill #0

## 2023-05-06 ENCOUNTER — Other Ambulatory Visit (HOSPITAL_COMMUNITY): Payer: Self-pay

## 2023-06-03 ENCOUNTER — Other Ambulatory Visit: Payer: Self-pay | Admitting: Nurse Practitioner

## 2023-06-03 ENCOUNTER — Other Ambulatory Visit (HOSPITAL_COMMUNITY): Payer: Self-pay

## 2023-06-03 DIAGNOSIS — F4310 Post-traumatic stress disorder, unspecified: Secondary | ICD-10-CM

## 2023-06-03 DIAGNOSIS — G893 Neoplasm related pain (acute) (chronic): Secondary | ICD-10-CM

## 2023-06-03 DIAGNOSIS — F411 Generalized anxiety disorder: Secondary | ICD-10-CM

## 2023-06-03 NOTE — Telephone Encounter (Signed)
Last apt 01/25/23.

## 2023-06-04 ENCOUNTER — Other Ambulatory Visit (HOSPITAL_COMMUNITY): Payer: Self-pay

## 2023-06-04 MED ORDER — HYDROCODONE-ACETAMINOPHEN 10-325 MG PO TABS
1.0000 | ORAL_TABLET | Freq: Four times a day (QID) | ORAL | 0 refills | Status: DC | PRN
Start: 2023-06-04 — End: 2023-07-01
  Filled 2023-06-04: qty 120, 30d supply, fill #0

## 2023-06-04 MED ORDER — ALPRAZOLAM 1 MG PO TABS
1.0000 mg | ORAL_TABLET | Freq: Two times a day (BID) | ORAL | 2 refills | Status: DC | PRN
Start: 2023-06-04 — End: 2023-08-26
  Filled 2023-06-04: qty 60, 30d supply, fill #0
  Filled 2023-07-02: qty 60, 30d supply, fill #1
  Filled 2023-08-03: qty 60, 30d supply, fill #2

## 2023-06-10 ENCOUNTER — Emergency Department (HOSPITAL_COMMUNITY)
Admission: EM | Admit: 2023-06-10 | Discharge: 2023-06-10 | Disposition: A | Discharge status: Home / Self Care | Payer: Commercial Managed Care - PPO | Attending: Emergency Medicine | Admitting: Emergency Medicine

## 2023-06-10 ENCOUNTER — Emergency Department (HOSPITAL_COMMUNITY): Payer: Commercial Managed Care - PPO

## 2023-06-10 ENCOUNTER — Encounter (HOSPITAL_COMMUNITY): Payer: Self-pay | Admitting: *Deleted

## 2023-06-10 DIAGNOSIS — M25551 Pain in right hip: Secondary | ICD-10-CM | POA: Diagnosis not present

## 2023-06-10 DIAGNOSIS — M8580 Other specified disorders of bone density and structure, unspecified site: Secondary | ICD-10-CM | POA: Diagnosis not present

## 2023-06-10 DIAGNOSIS — M25511 Pain in right shoulder: Secondary | ICD-10-CM | POA: Diagnosis not present

## 2023-06-10 DIAGNOSIS — M19011 Primary osteoarthritis, right shoulder: Secondary | ICD-10-CM | POA: Diagnosis not present

## 2023-06-10 DIAGNOSIS — M1611 Unilateral primary osteoarthritis, right hip: Secondary | ICD-10-CM | POA: Diagnosis not present

## 2023-06-10 DIAGNOSIS — G8929 Other chronic pain: Secondary | ICD-10-CM | POA: Diagnosis not present

## 2023-06-10 DIAGNOSIS — J449 Chronic obstructive pulmonary disease, unspecified: Secondary | ICD-10-CM | POA: Diagnosis not present

## 2023-06-10 LAB — BASIC METABOLIC PANEL
Anion gap: 12 (ref 5–15)
BUN: 8 mg/dL (ref 6–20)
CO2: 23 mmol/L (ref 22–32)
Calcium: 9.1 mg/dL (ref 8.9–10.3)
Chloride: 104 mmol/L (ref 98–111)
Creatinine, Ser: 0.63 mg/dL (ref 0.44–1.00)
GFR, Estimated: >60 mL/min (ref >60)
Glucose, Bld: 175 mg/dL — ABNORMAL HIGH (ref 70–99)
Potassium: 3.2 mmol/L — ABNORMAL LOW (ref 3.5–5.1)
Sodium: 139 mmol/L (ref 135–145)

## 2023-06-10 LAB — CBC WITH DIFFERENTIAL/PLATELET
Abs Immature Granulocytes: 0.04 10*3/uL (ref 0.00–0.07)
Basophils Absolute: 0.1 10*3/uL (ref 0.0–0.1)
Basophils Relative: 1 %
Eosinophils Absolute: 0.1 10*3/uL (ref 0.0–0.5)
Eosinophils Relative: 1 %
HCT: 42.3 % (ref 36.0–46.0)
Hemoglobin: 14.3 g/dL (ref 12.0–15.0)
Immature Granulocytes: 0 %
Lymphocytes Relative: 29 %
Lymphs Abs: 3 10*3/uL (ref 0.7–4.0)
MCH: 30.8 pg (ref 26.0–34.0)
MCHC: 33.8 g/dL (ref 30.0–36.0)
MCV: 91 fL (ref 80.0–100.0)
Monocytes Absolute: 0.9 10*3/uL (ref 0.1–1.0)
Monocytes Relative: 8 %
Neutro Abs: 6.2 10*3/uL (ref 1.7–7.7)
Neutrophils Relative %: 61 %
Platelets: 310 10*3/uL (ref 150–400)
RBC: 4.65 MIL/uL (ref 3.87–5.11)
RDW: 13.4 % (ref 11.5–15.5)
WBC: 10.3 10*3/uL (ref 4.0–10.5)
nRBC: 0 % (ref 0.0–0.2)

## 2023-06-10 MED ORDER — ONDANSETRON HCL 4 MG/2ML IJ SOLN
4.0000 mg | Freq: Once | INTRAMUSCULAR | Status: AC
  Administered 2023-06-10: 4 mg via INTRAVENOUS
  Filled 2023-06-10: qty 2

## 2023-06-10 MED ORDER — HYDROMORPHONE HCL 1 MG/ML IJ SOLN
1.0000 mg | Freq: Once | INTRAMUSCULAR | Status: AC
  Administered 2023-06-10: 1 mg via INTRAVENOUS
  Filled 2023-06-10: qty 1

## 2023-06-10 MED ORDER — FENTANYL CITRATE PF 50 MCG/ML IJ SOSY
50.0000 ug | PREFILLED_SYRINGE | INTRAMUSCULAR | Status: DC | PRN
  Administered 2023-06-10: 50 ug via INTRAVENOUS
  Filled 2023-06-10: qty 1

## 2023-06-10 NOTE — ED Provider Notes (Signed)
[place] EMERGENCY DEPARTMENT AT [facility] Provider Note   CSN: [id number] Arrival date & time: 06/10/23  1946     History  Chief Complaint  Patient presents with   Marletta Lor    Denise Ray is a 53 y.o. female.  Patient has a history of COPD.  She states that she felt like something grabbed her foot in the stairwell and she started having bad pain in her right hip.  The history is provided by the patient and medical records. No language interpreter was used.  Fall This is a new problem. The current episode started 3 to 5 hours ago. The problem occurs rarely. The problem has not changed since onset.Pertinent negatives include no chest pain, no abdominal pain and no headaches. Nothing aggravates the symptoms. Nothing relieves the symptoms. She has tried nothing for the symptoms.       Home Medications Prior to Admission medications   Medication Sig Start Date End Date Taking? Authorizing Provider  albuterol (PROVENTIL HFA;VENTOLIN HFA) 108 (90 BASE) MCG/ACT inhaler Inhale 2 puffs into the lungs every 6 (six) hours as needed. wheezing Patient taking differently: Inhale 2 puffs into the lungs every 6 (six) hours as needed for wheezing. wheezing 01/06/13   Deatra Canter, FNP  Albuterol Sulfate 2.5 MG/0.5ML NEBU Inhale into the lungs as needed.    [provider]  ALPRAZolam Prudy Feeler) 1 MG tablet Take 1 tablet (1 mg total) by mouth 2 (two) times daily as needed. 06/04/23   Tollie Eth, NP  famotidine (PEPCID) 20 MG tablet Take 20 mg by mouth at bedtime.     [provider]  gabapentin (NEURONTIN) 100 MG capsule Take 3 capsules (300 mg total) by mouth 4 (four) times daily. 02/16/23   Tollie Eth, NP  HYDROcodone-acetaminophen (NORCO) 10-325 MG tablet Take 1 tablet by mouth every 6 hours as needed. 06/04/23   Tollie Eth, NP  loratadine (CLARITIN) 10 MG tablet Take 10 mg by mouth at bedtime.     [provider]  nystatin (MYCOSTATIN/NYSTOP)  powder Apply 1 Application topically 2 (two) times daily. Apply to affected area for up to 7 days Patient taking differently: Apply 1 Application topically 2 (two) times daily as needed. Apply to affected area for up to 7 days 06/09/22   Jerene Bears, MD  nystatin cream (MYCOSTATIN) Apply topically 2 (two) times daily. Apply nightly as needed for treatment of candida Patient taking differently: Apply 1 Application topically 2 (two) times daily as needed. 06/09/22   Jerene Bears, MD  pantoprazole (PROTONIX) 40 MG tablet Take 1 tablet (40 mg total) by mouth daily. 02/16/23   Tollie Eth, NP  venlafaxine XR (EFFEXOR-XR) 150 MG 24 hr capsule Take 1 capsule (150 mg total) by mouth at bedtime. 02/16/23   Tollie Eth, NP  Vitamin D, Ergocalciferol, (DRISDOL) 1.25 MG (50000 UNIT) CAPS capsule Take 1 capsule (50,000 Units total) by mouth every 7 (seven) days. 03/08/23   Tollie Eth, NP      Allergies    Oseltamivir phosphate, Estrogens, Wellbutrin [bupropion], Ibuprofen, Other, and Morphine and codeine    Review of Systems   Review of Systems  Constitutional:  Negative for appetite change and fatigue.  HENT:  Negative for congestion, ear discharge and sinus pressure.   Eyes:  Negative for discharge.  Respiratory:  Negative for cough.   Cardiovascular:  Negative for chest pain.  Gastrointestinal:  Negative for abdominal pain and diarrhea.  Genitourinary:  Negative for frequency and hematuria.  Musculoskeletal:  Negative for back pain.       Right hip pain  Skin:  Negative for rash.  Neurological:  Negative for seizures and headaches.  Psychiatric/Behavioral:  Negative for hallucinations.     Physical Exam Updated Vital Signs BP 137/83   Pulse (!) 111   Resp 20   SpO2 97%  Physical Exam Vitals and nursing note reviewed.  Constitutional:      Appearance: She is well-developed.  HENT:     Head: Normocephalic.     Nose: Nose normal.  Eyes:     General: No scleral icterus.     Conjunctiva/sclera: Conjunctivae normal.  Neck:     Thyroid: No thyromegaly.  Cardiovascular:     Rate and Rhythm: Normal rate and regular rhythm.     Heart sounds: No murmur heard.    No friction rub. No gallop.  Pulmonary:     Breath sounds: No stridor. No wheezing or rales.  Chest:     Chest wall: No tenderness.  Abdominal:     General: There is no distension.     Tenderness: There is no abdominal tenderness. There is no rebound.  Musculoskeletal:     Cervical back: Neck supple.     Comments: Tender right hip  Lymphadenopathy:     Cervical: No cervical adenopathy.  Skin:    Findings: No erythema or rash.  Neurological:     Mental Status: She is alert and oriented to person, place, and time.     Motor: No abnormal muscle tone.     Coordination: Coordination normal.  Psychiatric:        Behavior: Behavior normal.     ED Results / Procedures / Treatments   Labs (all labs ordered are listed, but only abnormal results are displayed) Labs Reviewed  BASIC METABOLIC PANEL - Abnormal; Notable for the following components:      Result Value   Potassium 3.2 (*)    Glucose, Bld 175 (*)    All other components within normal limits  CBC WITH DIFFERENTIAL/PLATELET    EKG None  Radiology CT Hip Right Wo Contrast Result Date: 06/10/2023 CLINICAL DATA:  Right hip dislocation EXAM: CT OF THE RIGHT HIP WITHOUT CONTRAST TECHNIQUE: Multidetector CT imaging of the right hip was performed according to the standard protocol. Multiplanar CT image reconstructions were also generated. RADIATION DOSE REDUCTION: This exam was performed according to the departmental dose-optimization program which includes automated exposure control, adjustment of the mA and/or kV according to patient size and/or use of iterative reconstruction technique. COMPARISON:  None Available. FINDINGS: Bones/Joint/Cartilage Normal alignment. No acute fracture or dislocation. No lytic or blastic bone lesion. Mild to  moderate right hip degenerative arthritis with asymmetric joint space narrowing and osteophyte formation. Ligaments Suboptimally assessed by CT. Muscles and Tendons Normal muscular bulk. Iliopsoas and hamstring tendons are intact there is infiltration in the region of the gluteal tendon insertion which may be inflammatory or posttraumatic in nature as can be seen tendinopathy or avulsion. Soft tissues Unremarkable IMPRESSION: 1. No acute fracture or dislocation. 2. Mild to moderate right hip degenerative arthritis. 3. Infiltration in the region of the gluteal tendon insertion which may be inflammatory or posttraumatic in nature as can be seen with tendinopathy or avulsion. This could be better assessed with MRI examination if indicated. Electronically Signed   By: Helyn Numbers M.D.   On: 06/10/2023 22:04   DG Shoulder Right Result Date: 06/10/2023 CLINICAL  DATA:  Pain after fall. EXAM: RIGHT SHOULDER - 2 VIEW COMPARISON:  None Available. FINDINGS: No fracture or dislocation. Slight hypertrophic degenerative changes of the [facility] joint. Slight degenerative changes of the glenohumeral joint. There is a small density along the margin of the humeral head laterally which could be calcific tendinosis. IMPRESSION: Slight degenerative changes.  Possible area of calcific tendinosis. Electronically Signed   By: Karen Kays M.D.   On: 06/10/2023 20:56   DG Hip Unilat  With Pelvis 2-3 Views Right Result Date: 06/10/2023 CLINICAL DATA:  Pain after fall.  History of chronic pain as well. EXAM: DG HIP (WITH OR WITHOUT PELVIS) 4V RIGHT COMPARISON:  X-ray 04/28/2021.  MRI 05/30/2021. FINDINGS: Limited cross-table lateral views as these are under penetrated. There is some sclerosis along the right sacroiliac joint region. No fracture or dislocation. Preserved hip joints, pubic symphysis. Hyperostosis noted. Preserved bone mineralization. IMPRESSION: No acute osseous abnormality. Limited cross-table lateral views. Chronic  changes. Electronically Signed   By: Karen Kays M.D.   On: 06/10/2023 20:55    Procedures Procedures    Medications Ordered in ED Medications  fentaNYL (SUBLIMAZE) injection 50 mcg (50 mcg Intravenous Given 06/10/23 2006)  ondansetron [facility]) injection 4 mg (4 mg Intravenous Given 06/10/23 2007)  HYDROmorphone (DILAUDID) injection 1 mg (1 mg Intravenous Given 06/10/23 2017)    ED Course/ Medical Decision Making/ A&P                                 Medical Decision Making Amount and/or Complexity of Data Reviewed Labs: ordered. Radiology: ordered.  Risk Prescription drug management.   Right hip pain.  Possible gluteal tendon tear in the right hip.  Patient will be put on a walker and pain medicine and follow-up with Ortho        Final Clinical Impression(s) / ED Diagnoses Final diagnoses:  Right hip pain    Rx / DC Orders ED Discharge Orders     None         Bethann Berkshire, MD 06/14/23 1155

## 2023-06-10 NOTE — ED Triage Notes (Signed)
Pt felt like something "grabbed her foot" while walking down the stairwell. Increased pain to R hip with limited range of motion. Denies hitting her head during fall.

## 2023-06-10 NOTE — Discharge Instructions (Signed)
Rest for the next few days.  Use a walker to ambulate with.  Take your pain medicine you have at home.  Follow-up with orthopedic doctor next week

## 2023-06-16 ENCOUNTER — Ambulatory Visit (HOSPITAL_BASED_OUTPATIENT_CLINIC_OR_DEPARTMENT_OTHER): Payer: Commercial Managed Care - PPO | Admitting: Obstetrics & Gynecology

## 2023-06-23 ENCOUNTER — Other Ambulatory Visit (HOSPITAL_COMMUNITY): Payer: Self-pay

## 2023-06-23 MED ORDER — PREDNISONE 10 MG (21) PO TBPK
ORAL_TABLET | ORAL | 0 refills | Status: DC
  Filled 2023-06-23 – 2023-06-24 (×2): qty 21, 6d supply, fill #0

## 2023-06-24 ENCOUNTER — Other Ambulatory Visit: Payer: Self-pay

## 2023-06-24 ENCOUNTER — Other Ambulatory Visit (HOSPITAL_COMMUNITY): Payer: Self-pay

## 2023-07-01 ENCOUNTER — Other Ambulatory Visit (HOSPITAL_COMMUNITY): Payer: Self-pay

## 2023-07-01 ENCOUNTER — Other Ambulatory Visit: Payer: Self-pay | Admitting: Nurse Practitioner

## 2023-07-01 DIAGNOSIS — G893 Neoplasm related pain (acute) (chronic): Secondary | ICD-10-CM

## 2023-07-02 ENCOUNTER — Other Ambulatory Visit: Payer: Self-pay

## 2023-07-02 MED ORDER — HYDROCODONE-ACETAMINOPHEN 10-325 MG PO TABS
1.0000 | ORAL_TABLET | Freq: Four times a day (QID) | ORAL | 0 refills | Status: DC | PRN
Start: 2023-07-02 — End: 2023-08-03
  Filled 2023-07-02 – 2023-07-06 (×2): qty 120, 30d supply, fill #0

## 2023-07-02 NOTE — Telephone Encounter (Signed)
Last apt 02/16/23

## 2023-07-03 ENCOUNTER — Other Ambulatory Visit: Payer: Self-pay

## 2023-07-03 ENCOUNTER — Other Ambulatory Visit (HOSPITAL_COMMUNITY): Payer: Self-pay

## 2023-07-05 ENCOUNTER — Other Ambulatory Visit: Payer: Self-pay

## 2023-07-06 ENCOUNTER — Other Ambulatory Visit (HOSPITAL_COMMUNITY): Payer: Self-pay

## 2023-07-06 ENCOUNTER — Other Ambulatory Visit: Payer: Self-pay

## 2023-07-22 ENCOUNTER — Other Ambulatory Visit (HOSPITAL_COMMUNITY): Payer: Self-pay | Admitting: Nurse Practitioner

## 2023-07-22 DIAGNOSIS — Z1231 Encounter for screening mammogram for malignant neoplasm of breast: Secondary | ICD-10-CM

## 2023-07-29 DIAGNOSIS — H524 Presbyopia: Secondary | ICD-10-CM | POA: Diagnosis not present

## 2023-07-29 DIAGNOSIS — H2513 Age-related nuclear cataract, bilateral: Secondary | ICD-10-CM | POA: Diagnosis not present

## 2023-07-29 DIAGNOSIS — H52223 Regular astigmatism, bilateral: Secondary | ICD-10-CM | POA: Diagnosis not present

## 2023-07-29 DIAGNOSIS — H5203 Hypermetropia, bilateral: Secondary | ICD-10-CM | POA: Diagnosis not present

## 2023-07-29 LAB — HM DIABETES EYE EXAM

## 2023-08-03 ENCOUNTER — Other Ambulatory Visit (HOSPITAL_COMMUNITY): Payer: Self-pay

## 2023-08-03 ENCOUNTER — Other Ambulatory Visit: Payer: Self-pay

## 2023-08-03 ENCOUNTER — Other Ambulatory Visit: Payer: Self-pay | Admitting: Nurse Practitioner

## 2023-08-03 DIAGNOSIS — G893 Neoplasm related pain (acute) (chronic): Secondary | ICD-10-CM

## 2023-08-03 MED ORDER — HYDROCODONE-ACETAMINOPHEN 10-325 MG PO TABS
1.0000 | ORAL_TABLET | Freq: Four times a day (QID) | ORAL | 0 refills | Status: DC | PRN
Start: 2023-08-03 — End: 2023-08-26
  Filled 2023-08-03 – 2023-08-04 (×2): qty 120, 30d supply, fill #0

## 2023-08-03 NOTE — Telephone Encounter (Signed)
 Last apt 02/16/23 just filled 07/02/23.

## 2023-08-04 ENCOUNTER — Other Ambulatory Visit: Payer: Self-pay

## 2023-08-04 ENCOUNTER — Other Ambulatory Visit (HOSPITAL_COMMUNITY): Payer: Self-pay

## 2023-08-05 ENCOUNTER — Ambulatory Visit (HOSPITAL_COMMUNITY)
Admission: RE | Admit: 2023-08-05 | Discharge: 2023-08-05 | Disposition: A | Discharge status: Home / Self Care | Source: Ambulatory Visit | Attending: Nurse Practitioner | Admitting: Nurse Practitioner

## 2023-08-05 ENCOUNTER — Encounter (HOSPITAL_COMMUNITY): Payer: Self-pay

## 2023-08-05 DIAGNOSIS — Z1231 Encounter for screening mammogram for malignant neoplasm of breast: Secondary | ICD-10-CM | POA: Insufficient documentation

## 2023-08-10 ENCOUNTER — Encounter: Payer: Self-pay | Admitting: Nurse Practitioner

## 2023-08-17 ENCOUNTER — Encounter: Payer: Commercial Managed Care - PPO | Admitting: Family Medicine

## 2023-08-26 ENCOUNTER — Other Ambulatory Visit (HOSPITAL_COMMUNITY): Payer: Self-pay

## 2023-08-26 ENCOUNTER — Ambulatory Visit: Admitting: Nurse Practitioner

## 2023-08-26 ENCOUNTER — Encounter: Payer: Self-pay | Admitting: Nurse Practitioner

## 2023-08-26 ENCOUNTER — Other Ambulatory Visit: Payer: Self-pay

## 2023-08-26 VITALS — BP 124/82 | HR 74 | Wt 247.8 lb

## 2023-08-26 DIAGNOSIS — I509 Heart failure, unspecified: Secondary | ICD-10-CM

## 2023-08-26 DIAGNOSIS — G893 Neoplasm related pain (acute) (chronic): Secondary | ICD-10-CM

## 2023-08-26 DIAGNOSIS — M25551 Pain in right hip: Secondary | ICD-10-CM | POA: Diagnosis not present

## 2023-08-26 DIAGNOSIS — G4733 Obstructive sleep apnea (adult) (pediatric): Secondary | ICD-10-CM | POA: Diagnosis not present

## 2023-08-26 DIAGNOSIS — J449 Chronic obstructive pulmonary disease, unspecified: Secondary | ICD-10-CM | POA: Diagnosis not present

## 2023-08-26 DIAGNOSIS — F3342 Major depressive disorder, recurrent, in full remission: Secondary | ICD-10-CM

## 2023-08-26 DIAGNOSIS — E039 Hypothyroidism, unspecified: Secondary | ICD-10-CM | POA: Diagnosis not present

## 2023-08-26 DIAGNOSIS — D013 Carcinoma in situ of anus and anal canal: Secondary | ICD-10-CM | POA: Diagnosis not present

## 2023-08-26 DIAGNOSIS — I25709 Atherosclerosis of coronary artery bypass graft(s), unspecified, with unspecified angina pectoris: Secondary | ICD-10-CM

## 2023-08-26 DIAGNOSIS — E1169 Type 2 diabetes mellitus with other specified complication: Secondary | ICD-10-CM

## 2023-08-26 DIAGNOSIS — F411 Generalized anxiety disorder: Secondary | ICD-10-CM

## 2023-08-26 DIAGNOSIS — F4310 Post-traumatic stress disorder, unspecified: Secondary | ICD-10-CM

## 2023-08-26 MED ORDER — TIRZEPATIDE 2.5 MG/0.5ML ~~LOC~~ SOAJ
2.5000 mg | SUBCUTANEOUS | 0 refills | Status: DC
Start: 2023-08-26 — End: 2023-10-28
  Filled 2023-08-26 – 2023-09-02 (×2): qty 2, 28d supply, fill #0

## 2023-08-26 MED ORDER — HYDROCODONE-ACETAMINOPHEN 10-325 MG PO TABS
1.0000 | ORAL_TABLET | Freq: Four times a day (QID) | ORAL | 0 refills | Status: DC | PRN
  Filled 2023-08-26 – 2023-09-02 (×2): qty 120, 30d supply, fill #0

## 2023-08-26 MED ORDER — TIRZEPATIDE 5 MG/0.5ML ~~LOC~~ SOAJ
5.0000 mg | SUBCUTANEOUS | 0 refills | Status: DC
  Filled 2023-08-26 – 2023-10-04 (×2): qty 2, 28d supply, fill #0

## 2023-08-26 MED ORDER — TIRZEPATIDE 7.5 MG/0.5ML ~~LOC~~ SOAJ
7.5000 mg | SUBCUTANEOUS | 0 refills | Status: DC
  Filled 2023-08-26 – 2023-11-09 (×2): qty 2, 28d supply, fill #0

## 2023-08-26 MED ORDER — ALPRAZOLAM 1 MG PO TABS
1.0000 mg | ORAL_TABLET | Freq: Two times a day (BID) | ORAL | 2 refills | Status: DC | PRN
  Filled 2023-08-26 – 2023-09-02 (×2): qty 60, 30d supply, fill #0
  Filled 2023-10-04: qty 60, 30d supply, fill #1
  Filled 2023-11-09: qty 60, 30d supply, fill #2

## 2023-08-26 NOTE — Patient Instructions (Signed)
 I hope you feel soon!!!

## 2023-08-26 NOTE — Progress Notes (Signed)
 [name], DNP, AGNP-c [facility]  [address] [place], [place] [address] [phone]  ESTABLISHED PATIENT- Chronic Health and/or Follow-Up Visit  Blood pressure 124/82, pulse 74, weight 247 lb 12.8 oz (112.4 kg).    Denise Ray is a 53 y.o. year old female presenting today for evaluation and management of chronic conditions.   History of Present Illness Denise Ray is a 53 year old female who presents with right hip pain following a fall at work.  She experiences significant right hip pain following a fall at work a few months ago. Initially, she was unable to bear weight and required assistance to move. Despite starting to bear weight again, she continues to experience positional pain and difficulty with internal rotation of the hip. She has not undergone an MRI due to insurance issues. Initial treatments included a steroid pack which did not alleviate her symptoms, and she describes it as ineffective and increasing her sugar levels. Physical therapy was recommended, but she opted to perform exercises at home. She returned to work four weeks ago on a trial basis, working three nights a week, which she finds challenging. By the end of her shifts, she experiences significant hobbling and tries to avoid stairs but sometimes has to use them due to work demands. Her pain ranges from 3 to 7 out of 10, depending on her activity level.  She is not currently taking Effexor  due to gastrointestinal side effects and is not experiencing issues with depression. She previously used Effexor  in combination with Xanax  for complex PTSD.  She has tried low-dose samples of Ozempic  but did not tolerate it well and is considering other options for her condition.  Her thyroid  levels were normal on recent labs despite not taking her thyroid  medication. She has a history of fluctuating thyroid  levels, particularly after intubation, which has required intermittent treatment in the  past.  All ROS negative with exception of what is listed above.   PHYSICAL EXAM Physical Exam Vitals and nursing note reviewed.  Constitutional:      General: She is not in acute distress.    Appearance: Normal appearance. She is obese.  HENT:     Head: Normocephalic.  Eyes:     Conjunctiva/sclera: Conjunctivae normal.  Neck:     Vascular: No carotid bruit.  Cardiovascular:     Rate and Rhythm: Normal rate and regular rhythm.     Pulses: Normal pulses.     Heart sounds: Normal heart sounds.  Pulmonary:     Effort: Pulmonary effort is normal.     Breath sounds: Wheezing and rhonchi present.  Abdominal:     General: Bowel sounds are normal.     Palpations: Abdomen is soft.  Musculoskeletal:     Right lower leg: No edema.     Left lower leg: No edema.     Comments: Right hip pain with ROM limitations present.   Skin:    General: Skin is warm and dry.     Capillary Refill: Capillary refill takes less than 2 seconds.  Neurological:     General: No focal deficit present.     Mental Status: She is alert.  Psychiatric:        Mood and Affect: Mood normal.      PLAN Problem List Items Addressed This Visit     PTSD (post-traumatic stress disorder)   Her complex PTSD was previously managed with Effexor  and Xanax . She attempted to restart Effexor  but experienced gastrointestinal side effects, likely  due to the higher dose. Currently, she is not experiencing issues with depression and is not taking Effexor . She is open to restarting Effexor  if needed in the future.   - Monitor mental health status   - Consider restarting Effexor  if needed        Relevant Medications   ALPRAZolam  (XANAX ) 1 MG tablet   Depression, major, recurrent (HCC)   Her complex PTSD was previously managed with Effexor  and Xanax . She attempted to restart Effexor  but experienced gastrointestinal side effects, likely due to the higher dose. Currently, she is not experiencing issues with depression and is not  taking Effexor . She is open to restarting Effexor  if needed in the future.   - Monitor mental health status   - Consider restarting Effexor  if needed        Relevant Medications   ALPRAZolam  (XANAX ) 1 MG tablet   Acquired hypothyroidism   She has fluctuating thyroid  levels, particularly after intubation. Recent labs showed normal thyroid  function, and she is not currently taking thyroid  medication.   - Monitor thyroid  function   - Resume thyroid  medication if levels become abnormal         Relevant Orders   TSH + free T4 (Completed)   OSA (obstructive sleep apnea)   Patient not consistently using CPAP due to recurrent upper airway infections. -Monitor for symptoms of sleep apnea. - Consider re-evaluation for new/different machine to see if this would help reduce the symptoms.       Diabetes (HCC) - Primary   She previously tried low-dose samples of Ozempic  but did not tolerate it well. She is interested in trying Mounjaro , which is considered to have fewer gastrointestinal side effects and is more effective. She is motivated to find a treatment that improves her condition and is open to switching medications if Mounjaro  is not tolerated. Mounjaro  is considered the most effective option among the alternatives discussed.   - Prescribe Mounjaro  starting at 2.5 mg, then 5 mg, and 7.5 mg   - Monitor for gastrointestinal side effects and efficacy        Relevant Medications   tirzepatide  (MOUNJARO ) 2.5 MG/0.5ML Pen   tirzepatide  (MOUNJARO ) 5 MG/0.5ML Pen   tirzepatide  (MOUNJARO ) 7.5 MG/0.5ML Pen   Other Relevant Orders   CBC with Differential/Platelet (Completed)   CMP14+EGFR (Completed)   Hemoglobin A1c (Completed)   Microalbumin/Creatinine Ratio, Urine (Completed)   AIN grade III   Chronic pain associated with condition. Will monitor.       Chronic obstructive pulmonary disease (HCC)   Patient reports good control of symptoms. Previous intolerance to combined ICS and albuterol   due to thrush. -Continue current management with albuterol .      Relevant Orders   CBC with Differential/Platelet (Completed)   CMP14+EGFR (Completed)   Hemoglobin A1c (Completed)   Microalbumin/Creatinine Ratio, Urine (Completed)   Chronic congestive heart failure (HCC)   Rhonchi present, but no LE edema or reported rapid weight gain. Recommend smoking cessation and routine exercise. Monitor closely for 2lb weight gain overnight or 5lb weight gain in 1 week and report immediately.       Relevant Orders   CBC with Differential/Platelet (Completed)   CMP14+EGFR (Completed)   Hemoglobin A1c (Completed)   Microalbumin/Creatinine Ratio, Urine (Completed)   Coronary artery disease involving coronary bypass graft of native heart with angina pectoris (HCC)   Denies current symptoms. Continue to follow with cardiology.       Relevant Orders   CBC with Differential/Platelet (Completed)  CMP14+EGFR (Completed)   Hemoglobin A1c (Completed)   Microalbumin/Creatinine Ratio, Urine (Completed)   Acute right hip pain   She experienced a significant fall at work a few months ago, resulting in severe right hip pain and inability to bear weight. There is concern for a possible fracture that may have healed improperly. She has been experiencing positional pain and muscle spasms, with pain levels ranging from 3 to 7. Initial treatment with oral steroids was ineffective, and it increased her blood sugar levels. She is currently back at work but experiences significant pain by the end of the night. The orthopedist initially recommended an MRI, but due to insurance issues, injections are being tried first.   - Schedule injections for right hip pain   - Consider MRI if injections are ineffective        Generalized anxiety disorder   Relevant Medications   ALPRAZolam  (XANAX ) 1 MG tablet   Other Visit Diagnoses       Chronic neoplasm-related pain       Relevant Medications   HYDROcodone -acetaminophen   (NORCO) 10-325 MG tablet       Return in about 6 months (around 02/25/2024) for CPE.  [name], DNP, AGNP-c

## 2023-08-27 LAB — CBC WITH DIFFERENTIAL/PLATELET
Basophils Absolute: 0 10*3/uL (ref 0.0–0.2)
Basos: 1 %
EOS (ABSOLUTE): 0.2 10*3/uL (ref 0.0–0.4)
Eos: 3 %
Hematocrit: 43.1 % (ref 34.0–46.6)
Hemoglobin: 14.7 g/dL (ref 11.1–15.9)
Immature Grans (Abs): 0.1 10*3/uL (ref 0.0–0.1)
Immature Granulocytes: 1 %
Lymphocytes Absolute: 2.6 10*3/uL (ref 0.7–3.1)
Lymphs: 37 %
MCH: 30.9 pg (ref 26.6–33.0)
MCHC: 34.1 g/dL (ref 31.5–35.7)
MCV: 91 fL (ref 79–97)
Monocytes Absolute: 0.6 10*3/uL (ref 0.1–0.9)
Monocytes: 9 %
Neutrophils Absolute: 3.5 10*3/uL (ref 1.4–7.0)
Neutrophils: 49 %
Platelets: 337 10*3/uL (ref 150–450)
RBC: 4.76 x10E6/uL (ref 3.77–5.28)
RDW: 13.7 % (ref 11.7–15.4)
WBC: 6.9 10*3/uL (ref 3.4–10.8)

## 2023-08-27 LAB — CMP14+EGFR
ALT: 11 IU/L (ref 0–32)
AST: 15 IU/L (ref 0–40)
Albumin: 4.1 g/dL (ref 3.9–4.9)
Alkaline Phosphatase: 122 IU/L — ABNORMAL HIGH (ref 44–121)
BUN/Creatinine Ratio: 14 (ref 9–23)
BUN: 8 mg/dL (ref 6–24)
Bilirubin Total: 0.2 mg/dL (ref 0.0–1.2)
CO2: 21 mmol/L (ref 20–29)
Calcium: 9.2 mg/dL (ref 8.7–10.2)
Chloride: 103 mmol/L (ref 96–106)
Creatinine, Ser: 0.59 mg/dL (ref 0.57–1.00)
Globulin, Total: 2.7 g/dL (ref 1.5–4.5)
Glucose: 109 mg/dL — ABNORMAL HIGH (ref 70–99)
Potassium: 3.9 mmol/L (ref 3.5–5.2)
Sodium: 141 mmol/L (ref 134–144)
Total Protein: 6.8 g/dL (ref 6.0–8.5)
eGFR: 110 mL/min/{1.73_m2} (ref >59)

## 2023-08-27 LAB — MICROALBUMIN / CREATININE URINE RATIO
Creatinine, Urine: 227.8 mg/dL
Microalb/Creat Ratio: 6 mg/g{creat} (ref 0–29)
Microalbumin, Urine: 12.9 ug/mL

## 2023-08-27 LAB — TSH+FREE T4
Free T4: 0.64 ng/dL — ABNORMAL LOW (ref 0.82–1.77)
TSH: 4.15 u[IU]/mL (ref 0.450–4.500)

## 2023-08-27 LAB — HEMOGLOBIN A1C
Est. average glucose Bld gHb Est-mCnc: 169 mg/dL
Hgb A1c MFr Bld: 7.5 % — ABNORMAL HIGH (ref 4.8–5.6)

## 2023-08-30 ENCOUNTER — Other Ambulatory Visit: Payer: Self-pay

## 2023-08-30 ENCOUNTER — Encounter: Payer: Self-pay | Admitting: Pharmacist

## 2023-09-02 ENCOUNTER — Other Ambulatory Visit (HOSPITAL_COMMUNITY): Payer: Self-pay

## 2023-09-02 ENCOUNTER — Encounter: Payer: Self-pay | Admitting: Nurse Practitioner

## 2023-09-02 ENCOUNTER — Other Ambulatory Visit: Payer: Self-pay | Admitting: Nurse Practitioner

## 2023-09-02 ENCOUNTER — Other Ambulatory Visit: Payer: Self-pay

## 2023-09-02 DIAGNOSIS — E039 Hypothyroidism, unspecified: Secondary | ICD-10-CM

## 2023-09-02 MED ORDER — LEVOTHYROXINE SODIUM 25 MCG PO TABS
25.0000 ug | ORAL_TABLET | Freq: Every day | ORAL | 3 refills | Status: DC
  Filled 2023-09-02 (×2): qty 90, 90d supply, fill #0
  Filled 2024-02-09: qty 90, 90d supply, fill #1

## 2023-09-03 ENCOUNTER — Other Ambulatory Visit: Payer: Self-pay

## 2023-09-03 ENCOUNTER — Other Ambulatory Visit (HOSPITAL_COMMUNITY): Payer: Self-pay

## 2023-09-10 NOTE — Assessment & Plan Note (Signed)
 Rhonchi present, but no LE edema or reported rapid weight gain. Recommend smoking cessation and routine exercise. Monitor closely for 2lb weight gain overnight or 5lb weight gain in 1 week and report immediately.

## 2023-09-10 NOTE — Assessment & Plan Note (Signed)
 She previously tried low-dose samples of Ozempic  but did not tolerate it well. She is interested in trying Mounjaro , which is considered to have fewer gastrointestinal side effects and is more effective. She is motivated to find a treatment that improves her condition and is open to switching medications if Mounjaro  is not tolerated. Mounjaro  is considered the most effective option among the alternatives discussed.   - Prescribe Mounjaro  starting at 2.5 mg, then 5 mg, and 7.5 mg   - Monitor for gastrointestinal side effects and efficacy

## 2023-09-10 NOTE — Assessment & Plan Note (Signed)
 Chronic pain associated with condition. Will monitor.

## 2023-09-10 NOTE — Assessment & Plan Note (Signed)
 Denies current symptoms. Continue to follow with cardiology.

## 2023-09-10 NOTE — Assessment & Plan Note (Signed)
Patient not consistently using CPAP due to recurrent upper airway infections. -Monitor for symptoms of sleep apnea. - Consider re-evaluation for new/different machine to see if this would help reduce the symptoms.

## 2023-09-10 NOTE — Assessment & Plan Note (Signed)
 Her complex PTSD was previously managed with Effexor  and Xanax . She attempted to restart Effexor  but experienced gastrointestinal side effects, likely due to the higher dose. Currently, she is not experiencing issues with depression and is not taking Effexor . She is open to restarting Effexor  if needed in the future.   - Monitor mental health status   - Consider restarting Effexor  if needed

## 2023-09-10 NOTE — Assessment & Plan Note (Signed)
 She experienced a significant fall at work a few months ago, resulting in severe right hip pain and inability to bear weight. There is concern for a possible fracture that may have healed improperly. She has been experiencing positional pain and muscle spasms, with pain levels ranging from 3 to 7. Initial treatment with oral steroids was ineffective, and it increased her blood sugar levels. She is currently back at work but experiences significant pain by the end of the night. The orthopedist initially recommended an MRI, but due to insurance issues, injections are being tried first.   - Schedule injections for right hip pain   - Consider MRI if injections are ineffective

## 2023-09-10 NOTE — Assessment & Plan Note (Signed)
Patient reports good control of symptoms. Previous intolerance to combined ICS and albuterol due to thrush. -Continue current management with albuterol.

## 2023-09-10 NOTE — Assessment & Plan Note (Signed)
 She has fluctuating thyroid  levels, particularly after intubation. Recent labs showed normal thyroid  function, and she is not currently taking thyroid  medication.   - Monitor thyroid  function   - Resume thyroid  medication if levels become abnormal

## 2023-09-22 DIAGNOSIS — D013 Carcinoma in situ of anus and anal canal: Secondary | ICD-10-CM | POA: Diagnosis not present

## 2023-10-04 ENCOUNTER — Other Ambulatory Visit (HOSPITAL_COMMUNITY): Payer: Self-pay

## 2023-10-04 ENCOUNTER — Other Ambulatory Visit: Payer: Self-pay

## 2023-10-04 ENCOUNTER — Other Ambulatory Visit: Payer: Self-pay | Admitting: Nurse Practitioner

## 2023-10-04 DIAGNOSIS — G893 Neoplasm related pain (acute) (chronic): Secondary | ICD-10-CM

## 2023-10-04 MED ORDER — HYDROCODONE-ACETAMINOPHEN 10-325 MG PO TABS
1.0000 | ORAL_TABLET | Freq: Four times a day (QID) | ORAL | 0 refills | Status: DC | PRN
  Filled 2023-10-04: qty 120, 30d supply, fill #0

## 2023-10-04 NOTE — Telephone Encounter (Signed)
 Last apt 08/26/23

## 2023-10-28 ENCOUNTER — Other Ambulatory Visit (HOSPITAL_COMMUNITY): Payer: Self-pay

## 2023-10-28 ENCOUNTER — Encounter (HOSPITAL_BASED_OUTPATIENT_CLINIC_OR_DEPARTMENT_OTHER): Payer: Self-pay | Admitting: Obstetrics & Gynecology

## 2023-10-28 ENCOUNTER — Ambulatory Visit (HOSPITAL_BASED_OUTPATIENT_CLINIC_OR_DEPARTMENT_OTHER): Admitting: Obstetrics & Gynecology

## 2023-10-28 VITALS — BP 126/77 | HR 79 | Ht 65.0 in | Wt 239.0 lb

## 2023-10-28 DIAGNOSIS — Z803 Family history of malignant neoplasm of breast: Secondary | ICD-10-CM | POA: Diagnosis not present

## 2023-10-28 DIAGNOSIS — Z720 Tobacco use: Secondary | ICD-10-CM

## 2023-10-28 DIAGNOSIS — Z01419 Encounter for gynecological examination (general) (routine) without abnormal findings: Secondary | ICD-10-CM

## 2023-10-28 DIAGNOSIS — I25709 Atherosclerosis of coronary artery bypass graft(s), unspecified, with unspecified angina pectoris: Secondary | ICD-10-CM

## 2023-10-28 DIAGNOSIS — Z90710 Acquired absence of both cervix and uterus: Secondary | ICD-10-CM | POA: Diagnosis not present

## 2023-10-28 DIAGNOSIS — A630 Anogenital (venereal) warts: Secondary | ICD-10-CM | POA: Diagnosis not present

## 2023-10-28 DIAGNOSIS — E1165 Type 2 diabetes mellitus with hyperglycemia: Secondary | ICD-10-CM

## 2023-10-28 DIAGNOSIS — D013 Carcinoma in situ of anus and anal canal: Secondary | ICD-10-CM | POA: Diagnosis not present

## 2023-10-28 MED ORDER — IMIQUIMOD 5 % EX CREA
TOPICAL_CREAM | CUTANEOUS | 1 refills | Status: DC
  Filled 2023-10-28: qty 12, 28d supply, fill #0
  Filled 2023-12-09 (×2): qty 12, 28d supply, fill #1

## 2023-10-28 NOTE — Progress Notes (Signed)
 ANNUAL EXAM Patient name: Denise Ray MRN [id number]  Date of birth: Aug 13, 1970 Chief Complaint:   Annual Exam  History of Present Illness:   Denise Ray is a 53 y.o. G37P1001 Caucasian female being seen today for a routine annual exam.  Denies vaginal bleeding.  H/o prior cervical dysplasia.  Pap was negative last year.    H/o AIN III.  Had chemical ablation.  Rectal bleeding is better.  Has follow up two weeks.     No LMP recorded. Patient has had a hysterectomy.  Last pap 06/09/2022. Results were: NILM w/ HRHPV negative. H/O abnormal pap: yes :  h/o CIN  Last mammogram: 08/05/2023  . Results were: normal. Family h/o breast cancer: yes :  brother Last colonoscopy:  had not done one.      10/28/2023    9:23 AM 08/26/2023    2:34 PM 02/16/2023    9:22 AM 10/19/2022    4:03 PM 07/07/2022    3:46 PM  Depression screen PHQ 2/9  Decreased Interest 0 0 0 0 0  Down, Depressed, Hopeless 0 0 0 0 0  PHQ - 2 Score 0 0 0 0 0  Altered sleeping   1    Tired, decreased energy   3    Change in appetite   3    Feeling bad or failure about yourself    0    Trouble concentrating   1    Moving slowly or fidgety/restless   0    Suicidal thoughts   0    PHQ-9 Score   8    Difficult doing work/chores   Not difficult at all       Review of Systems:   Pertinent items are noted in HPI Denies any headaches, blurred vision, fatigue, shortness of breath, chest pain, abdominal pain, abnormal vaginal discharge/itching/odor/irritation, problems with periods, bowel movements, urination, or intercourse unless otherwise stated above. Pertinent History Reviewed:  Reviewed past medical,surgical, social and family history.  Reviewed problem list, medications and allergies. Physical Assessment:   Vitals:   10/28/23 0917  BP: 126/77  Pulse: 79  Weight: 239 lb (108.4 kg)  Height: 5' 5 (1.651 m)  Body mass index is 39.77 kg/m.        Physical Examination:   General appearance - well appearing,  and in no distress  Mental status - alert, oriented to person, place, and time  Psych:  She has a normal mood and affect  Skin - warm and dry, normal color, no suspicious lesions noted  Chest - effort normal, all lung fields clear to auscultation bilaterally  Heart - normal rate and regular rhythm  Neck:  midline trachea, no thyromegaly or nodules  Breasts - breasts appear normal, no suspicious masses, no skin or nipple changes or  axillary nodes  Abdomen - soft, nontender, nondistended, no masses or organomegaly  Pelvic - VULVA: small condylomatous appearing lesion right superior labia minora, tenderness or lesions   VAGINA: normal appearing vagina with normal color and discharge, no lesions  CERVIX: normal appearing cervix without discharge or lesions, no CMT  Thin prep pap is not indicated  UTERUS: uterus is felt to be normal size, shape, consistency and nontender   ADNEXA: No adnexal masses or tenderness noted.  Rectal - normal rectal, good sphincter tone, no masses felt.   Extremities:  No swelling or varicosities noted  Chaperone present for exam  Assessment & Plan:  1. Well woman exam with routine gynecological exam (  Primary) - Pap smear neg with neg HR HPV 2024.  Not indicated today. - Mammogram 07/2023 - Colonoscopy declined for now.  Discussed pt could possibly have this done with procedure for AIN - Bone mineral density not indicated - lab work done with PCP, Abraham Hoffmann Early 08/2023 - vaccines reviewed/updated  2. AIN grade III - followed by Dr. Andy Bannister  3. Coronary artery disease involving coronary bypass graft of native heart with angina pectoris (HCC)  4. H/O: hysterectomy  5. Smoking trying to quit - discussed lung cancer CT for screening.  Declines for now.   6. Family history of breast cancer - have discussed breast MRI in the past.  Has declined.  Doing yearly mammograms.  7. Type 2 diabetes mellitus with hyperglycemia, without long-term current use of insulin   (HCC) - On Mounjaro .  Last HbA1C was 7.5.     8. Condyloma - topical treatment discussed.  Will plan recheck 6 weeks and if still present, plan removal. - imiquimod  (ALDARA ) 5 % cream; Apply topically 3 (three) times a week. Apply entire packet of cream to affected areas 3 times weekly.  Apply at night and leave on during sleeping hours.  Wash off completely after 8 hours.  Dispense: 12 each; Refill: 1   No orders of the defined types were placed in this encounter.   Meds:  Meds ordered this encounter  Medications   imiquimod  (ALDARA ) 5 % cream    Sig: Apply topically 3 (three) times a week. Apply entire packet of cream to affected areas 3 times weekly.  Apply at night and leave on during sleeping hours.  Wash off completely after 8 hours.    Dispense:  12 each    Refill:  1    Follow-up: Return in about 6 weeks (around 12/09/2023).  Lillian Rein, MD 10/28/2023 10:17 AM

## 2023-11-09 ENCOUNTER — Other Ambulatory Visit: Payer: Self-pay | Admitting: Family Medicine

## 2023-11-09 ENCOUNTER — Other Ambulatory Visit (HOSPITAL_COMMUNITY): Payer: Self-pay

## 2023-11-09 ENCOUNTER — Other Ambulatory Visit: Payer: Self-pay

## 2023-11-09 DIAGNOSIS — G893 Neoplasm related pain (acute) (chronic): Secondary | ICD-10-CM

## 2023-11-10 ENCOUNTER — Other Ambulatory Visit (HOSPITAL_COMMUNITY): Payer: Self-pay

## 2023-11-10 MED ORDER — HYDROCODONE-ACETAMINOPHEN 10-325 MG PO TABS
1.0000 | ORAL_TABLET | Freq: Four times a day (QID) | ORAL | 0 refills | Status: DC | PRN
  Filled 2023-11-10: qty 120, 30d supply, fill #0

## 2023-12-08 ENCOUNTER — Other Ambulatory Visit: Payer: Self-pay | Admitting: Nurse Practitioner

## 2023-12-08 ENCOUNTER — Other Ambulatory Visit (HOSPITAL_COMMUNITY): Payer: Self-pay

## 2023-12-08 DIAGNOSIS — G893 Neoplasm related pain (acute) (chronic): Secondary | ICD-10-CM

## 2023-12-08 DIAGNOSIS — F411 Generalized anxiety disorder: Secondary | ICD-10-CM

## 2023-12-08 DIAGNOSIS — E1169 Type 2 diabetes mellitus with other specified complication: Secondary | ICD-10-CM

## 2023-12-08 DIAGNOSIS — F4310 Post-traumatic stress disorder, unspecified: Secondary | ICD-10-CM

## 2023-12-08 MED ORDER — MOUNJARO 7.5 MG/0.5ML ~~LOC~~ SOAJ
7.5000 mg | SUBCUTANEOUS | 0 refills | Status: DC
  Filled 2023-12-08 – 2023-12-09 (×2): qty 2, 28d supply, fill #0

## 2023-12-08 MED ORDER — HYDROCODONE-ACETAMINOPHEN 10-325 MG PO TABS
1.0000 | ORAL_TABLET | Freq: Four times a day (QID) | ORAL | 0 refills | Status: DC | PRN
  Filled 2023-12-08 – 2023-12-10 (×3): qty 120, 30d supply, fill #0

## 2023-12-08 MED ORDER — ALPRAZOLAM 1 MG PO TABS
1.0000 mg | ORAL_TABLET | Freq: Two times a day (BID) | ORAL | 2 refills | Status: AC | PRN
Start: 2023-12-08 — End: ?
  Filled 2023-12-08 – 2023-12-10 (×3): qty 60, 30d supply, fill #0
  Filled 2024-01-07: qty 60, 30d supply, fill #1
  Filled 2024-02-09: qty 60, 30d supply, fill #2

## 2023-12-09 ENCOUNTER — Other Ambulatory Visit (HOSPITAL_COMMUNITY): Payer: Self-pay

## 2023-12-09 ENCOUNTER — Other Ambulatory Visit: Payer: Self-pay

## 2023-12-09 ENCOUNTER — Encounter (HOSPITAL_BASED_OUTPATIENT_CLINIC_OR_DEPARTMENT_OTHER): Payer: Self-pay | Admitting: Obstetrics & Gynecology

## 2023-12-09 ENCOUNTER — Other Ambulatory Visit (HOSPITAL_COMMUNITY)
Admission: RE | Admit: 2023-12-09 | Discharge: 2023-12-09 | Disposition: A | Discharge status: Home / Self Care | Source: Ambulatory Visit | Attending: Obstetrics & Gynecology | Admitting: Obstetrics & Gynecology

## 2023-12-09 ENCOUNTER — Ambulatory Visit (HOSPITAL_BASED_OUTPATIENT_CLINIC_OR_DEPARTMENT_OTHER): Admitting: Obstetrics & Gynecology

## 2023-12-09 VITALS — BP 127/61 | HR 93 | Wt 244.0 lb

## 2023-12-09 DIAGNOSIS — N9089 Other specified noninflammatory disorders of vulva and perineum: Secondary | ICD-10-CM

## 2023-12-09 DIAGNOSIS — A630 Anogenital (venereal) warts: Secondary | ICD-10-CM | POA: Diagnosis not present

## 2023-12-10 ENCOUNTER — Other Ambulatory Visit (HOSPITAL_COMMUNITY): Payer: Self-pay

## 2023-12-10 ENCOUNTER — Other Ambulatory Visit: Payer: Self-pay

## 2023-12-12 NOTE — Progress Notes (Signed)
 GYNECOLOGY  VISIT  CC:   excision of vulvar lesion  HPI: 53 y.o. G51P1001 Divorced White or Caucasian female here for excision of vulvar lesion.  Has tried aldara  but was stopped after two weeks due to skin sensitivity.  H/o condyloma.  Consent obtained.  Risks reviewed.    Has started smoking again.     Past Medical History:  Diagnosis Date   Chronic congestive heart failure, unspecified heart failure type (HCC) 07/07/2022   per pcp   Chronic pain    followed by pain clinic   Complication of anesthesia    post op urinary retention   COPD with asthma (HCC)    followed by pcp (previous seen by [facility]) hx exacerbation's  --- ( 10-01-2022 last asthma exacerbation ED visit 03-10-2022 in epic brought by EMS, ARF w/ hypoxmia Bipap,  left AMA last used rescue inhaler a week ago)   DDD (degenerative disc disease), lumbosacral    DOE (dyspnea on exertion)    per pt going flight of stairs but normal daily activites no sob   Family history of adverse reaction to anesthesia    mother -- ponv ;  per pt in 04/ 2021 father had cardiac arrest and Atrial fib intraoperative for circumcision (this was in [place] ) put on venitator for two days then had nuclear stress test per pt showed disease but no cath done, still  has atrial fib;  she also stated prior to this he known cardiac disease with no intervention   GAD (generalized anxiety disorder)    GERD (gastroesophageal reflux disease)    Hemorrhoids    History of acute respiratory failure    06/ 2015  and 12/ 2016  CAP and Asthma exacerbation--  vented both times (ARDS)   History of anal dysplasia    AIN 2/ 3    s/p  surgical excision's    (07/ 2021   s/p excision anal polyp ,  SCCIS)   History of cervical dysplasia    CIN 2 in 2000/   History of chest pain    per pt had cardiology evaluation for chest pain (but pt stated has not had any chest pain or cardiac symtpoms since) referred by pcp  w/ dr delford note in epic 05-22-2019;  pt  had nuclear stress test the showed normal perfusion no ischemia and ef >65% ,  also had echo same day showed normal w/ ef 65%   History of condyloma acuminatum    anal condyloma   s/p excision's   History of pneumonia    hx Required ventilatory support   History of respiratory failure 10/31/2013   History of TIA (transient ischemic attack)    Caused by medication reaction with combination of wellbutrin and estrogen   HSV-2 infection    Hypothyroidism    followed by pcp   MDD (major depressive disorder)    Migraines    OA (osteoarthritis)    bilateral hips w/ pain   OSA on CPAP    10-05-2022 pt stated usually uses nightly however not every night due to seasonall allergies   Peripheral neuropathy    PTSD (post-traumatic stress disorder)    Type 2 diabetes mellitus (HCC)    followed by pcp  (10-05-2022 pt stated does not check blood sugar)   Wears glasses     MEDS:   Current Outpatient Medications on File Prior to Visit  Medication Sig Dispense Refill   albuterol  (PROVENTIL  HFA;VENTOLIN  HFA) 108 (90 BASE) MCG/ACT inhaler  Inhale 2 puffs into the lungs every 6 (six) hours as needed. wheezing 1 Inhaler 11   Albuterol  Sulfate 2.5 MG/0.5ML NEBU Inhale into the lungs as needed.     ALPRAZolam  (XANAX ) 1 MG tablet Take 1 tablet (1 mg total) by mouth 2 (two) times daily as needed. 60 tablet 2   famotidine (PEPCID) 20 MG tablet Take 20 mg by mouth at bedtime.      gabapentin  (NEURONTIN ) 100 MG capsule Take 3 capsules (300 mg total) by mouth 4 (four) times daily. 360 capsule 11   HYDROcodone -acetaminophen  (NORCO) 10-325 MG tablet Take 1 tablet by mouth every 6 hours as needed. 120 tablet 0   imiquimod  (ALDARA ) 5 % cream Apply entire packet of cream to affected areas 3 times weekly.  Apply at night and leave on during sleeping hours.  Wash off completely after 8 hours. 12 each 1   loratadine  (CLARITIN ) 10 MG tablet Take 10 mg by mouth at bedtime.      pantoprazole  (PROTONIX ) 40 MG tablet Take 1  tablet (40 mg total) by mouth daily. 90 tablet 3   tirzepatide  (MOUNJARO ) 7.5 MG/0.5ML Pen Inject 7.5 mg into the skin once a week. Month 3 2 mL 0   levothyroxine  (SYNTHROID ) 25 MCG tablet Take 1 tablet (25 mcg total) by mouth daily. (Patient not taking: Reported on 12/09/2023) 90 tablet 3   tirzepatide  (MOUNJARO ) 5 MG/0.5ML Pen Inject 5 mg into the skin once a week. Month 2 (Patient not taking: Reported on 12/09/2023) 2 mL 0   Vitamin D , Ergocalciferol , (DRISDOL ) 1.25 MG (50000 UNIT) CAPS capsule Take 1 capsule (50,000 Units total) by mouth every 7 (seven) days. (Patient not taking: Reported on 12/09/2023) 12 capsule 3   No current facility-administered medications on file prior to visit.    ALLERGIES: Oseltamivir phosphate, Estrogens, Wellbutrin [bupropion], Ibuprofen , Other, and Morphine  and codeine  SH:  divorced, smoking  Review of Systems  Constitutional: Negative.   Genitourinary:        Area for removal tender today    PHYSICAL EXAMINATION:    BP 127/61 (BP Location: Left Arm, Patient Position: Sitting, Cuff Size: Large)   Pulse 93   Wt 244 lb (110.7 kg)   SpO2 95%   BMI 40.60 kg/m     General appearance: alert, cooperative and appears stated age Pelvic: External genitalia:  right vulvar pink lesion inferior to right labia majora              Urethra:  normal appearing urethra with no masses, tenderness or lesions              Bartholins and Skenes: normal     Procedure:   Area cleansed with Betadine x 3.  1.5cc 1% Lidocaine  instilled beneath the lesion measuring about 5mm.  Entire lesion removed with #11 blade.  Silver  nitrate used for hemostasis.  Triple antibiotic ointment applied after procedure.  No dressing applied.    Chaperone CMA, was present for exam.  Assessment/Plan: 1. Vulvar lesion (Primary) - lesion removed will be sent to pathology and results will be communicated to patient.   - Surgical pathology( [facility]/ [facility])

## 2023-12-14 ENCOUNTER — Ambulatory Visit (HOSPITAL_BASED_OUTPATIENT_CLINIC_OR_DEPARTMENT_OTHER): Payer: Self-pay | Admitting: Obstetrics & Gynecology

## 2023-12-14 LAB — SURGICAL PATHOLOGY

## 2024-01-05 ENCOUNTER — Other Ambulatory Visit: Payer: Self-pay | Admitting: Nurse Practitioner

## 2024-01-05 ENCOUNTER — Other Ambulatory Visit (HOSPITAL_COMMUNITY): Payer: Self-pay

## 2024-01-05 DIAGNOSIS — E1169 Type 2 diabetes mellitus with other specified complication: Secondary | ICD-10-CM

## 2024-01-05 DIAGNOSIS — G893 Neoplasm related pain (acute) (chronic): Secondary | ICD-10-CM

## 2024-01-05 MED ORDER — MOUNJARO 7.5 MG/0.5ML ~~LOC~~ SOAJ
7.5000 mg | SUBCUTANEOUS | 0 refills | Status: DC
  Filled 2024-01-05: qty 2, 28d supply, fill #0

## 2024-01-05 MED ORDER — HYDROCODONE-ACETAMINOPHEN 10-325 MG PO TABS
1.0000 | ORAL_TABLET | Freq: Four times a day (QID) | ORAL | 0 refills | Status: DC | PRN
  Filled 2024-01-05 – 2024-01-12 (×2): qty 120, 30d supply, fill #0

## 2024-01-05 NOTE — Telephone Encounter (Signed)
 Last apt 08/26/23

## 2024-01-06 ENCOUNTER — Other Ambulatory Visit (HOSPITAL_COMMUNITY): Payer: Self-pay

## 2024-01-06 ENCOUNTER — Other Ambulatory Visit: Payer: Self-pay

## 2024-01-07 ENCOUNTER — Other Ambulatory Visit: Payer: Self-pay

## 2024-01-12 ENCOUNTER — Other Ambulatory Visit (HOSPITAL_COMMUNITY): Payer: Self-pay

## 2024-01-18 ENCOUNTER — Other Ambulatory Visit (HOSPITAL_COMMUNITY): Payer: Self-pay

## 2024-02-09 ENCOUNTER — Other Ambulatory Visit: Payer: Self-pay | Admitting: Nurse Practitioner

## 2024-02-09 ENCOUNTER — Other Ambulatory Visit (HOSPITAL_COMMUNITY): Payer: Self-pay

## 2024-02-09 DIAGNOSIS — E1169 Type 2 diabetes mellitus with other specified complication: Secondary | ICD-10-CM

## 2024-02-09 DIAGNOSIS — G893 Neoplasm related pain (acute) (chronic): Secondary | ICD-10-CM

## 2024-02-09 NOTE — Telephone Encounter (Signed)
 Last apt 08/26/23

## 2024-02-10 ENCOUNTER — Other Ambulatory Visit: Payer: Self-pay

## 2024-02-10 ENCOUNTER — Other Ambulatory Visit (HOSPITAL_COMMUNITY): Payer: Self-pay

## 2024-02-10 MED ORDER — HYDROCODONE-ACETAMINOPHEN 10-325 MG PO TABS
1.0000 | ORAL_TABLET | Freq: Four times a day (QID) | ORAL | 0 refills | Status: DC | PRN
  Filled 2024-02-10: qty 120, 30d supply, fill #0

## 2024-02-10 MED ORDER — MOUNJARO 7.5 MG/0.5ML ~~LOC~~ SOAJ
7.5000 mg | SUBCUTANEOUS | 0 refills | Status: DC
  Filled 2024-02-10: qty 2, 28d supply, fill #0

## 2024-02-11 ENCOUNTER — Other Ambulatory Visit (HOSPITAL_COMMUNITY): Payer: Self-pay

## 2024-02-17 ENCOUNTER — Other Ambulatory Visit (HOSPITAL_COMMUNITY): Payer: Self-pay

## 2024-02-22 ENCOUNTER — Encounter (HOSPITAL_COMMUNITY): Payer: Self-pay

## 2024-02-22 ENCOUNTER — Other Ambulatory Visit (HOSPITAL_COMMUNITY): Payer: Self-pay

## 2024-03-03 ENCOUNTER — Encounter: Payer: Self-pay | Admitting: Nurse Practitioner

## 2024-03-03 ENCOUNTER — Ambulatory Visit: Admitting: Nurse Practitioner

## 2024-03-03 ENCOUNTER — Other Ambulatory Visit (HOSPITAL_COMMUNITY): Payer: Self-pay

## 2024-03-03 ENCOUNTER — Other Ambulatory Visit: Payer: Self-pay

## 2024-03-03 ENCOUNTER — Other Ambulatory Visit (HOSPITAL_BASED_OUTPATIENT_CLINIC_OR_DEPARTMENT_OTHER): Payer: Self-pay

## 2024-03-03 VITALS — BP 124/80 | HR 81 | Ht 64.5 in | Wt 225.8 lb

## 2024-03-03 DIAGNOSIS — F411 Generalized anxiety disorder: Secondary | ICD-10-CM

## 2024-03-03 DIAGNOSIS — E1169 Type 2 diabetes mellitus with other specified complication: Secondary | ICD-10-CM | POA: Diagnosis not present

## 2024-03-03 DIAGNOSIS — D013 Carcinoma in situ of anus and anal canal: Secondary | ICD-10-CM

## 2024-03-03 DIAGNOSIS — E66812 Obesity, class 2: Secondary | ICD-10-CM

## 2024-03-03 DIAGNOSIS — J441 Chronic obstructive pulmonary disease with (acute) exacerbation: Secondary | ICD-10-CM | POA: Diagnosis not present

## 2024-03-03 DIAGNOSIS — Z1159 Encounter for screening for other viral diseases: Secondary | ICD-10-CM | POA: Diagnosis not present

## 2024-03-03 DIAGNOSIS — I509 Heart failure, unspecified: Secondary | ICD-10-CM

## 2024-03-03 DIAGNOSIS — Z0000 Encounter for general adult medical examination without abnormal findings: Secondary | ICD-10-CM

## 2024-03-03 DIAGNOSIS — E039 Hypothyroidism, unspecified: Secondary | ICD-10-CM | POA: Diagnosis not present

## 2024-03-03 DIAGNOSIS — G893 Neoplasm related pain (acute) (chronic): Secondary | ICD-10-CM | POA: Diagnosis not present

## 2024-03-03 DIAGNOSIS — Z716 Tobacco abuse counseling: Secondary | ICD-10-CM

## 2024-03-03 DIAGNOSIS — B370 Candidal stomatitis: Secondary | ICD-10-CM

## 2024-03-03 DIAGNOSIS — G4733 Obstructive sleep apnea (adult) (pediatric): Secondary | ICD-10-CM

## 2024-03-03 DIAGNOSIS — J439 Emphysema, unspecified: Secondary | ICD-10-CM

## 2024-03-03 DIAGNOSIS — K219 Gastro-esophageal reflux disease without esophagitis: Secondary | ICD-10-CM

## 2024-03-03 DIAGNOSIS — F4310 Post-traumatic stress disorder, unspecified: Secondary | ICD-10-CM

## 2024-03-03 DIAGNOSIS — E662 Morbid (severe) obesity with alveolar hypoventilation: Secondary | ICD-10-CM

## 2024-03-03 MED ORDER — PREDNISONE 20 MG PO TABS
40.0000 mg | ORAL_TABLET | Freq: Every day | ORAL | 0 refills | Status: DC
  Filled 2024-03-03: qty 10, 5d supply, fill #0

## 2024-03-03 MED ORDER — VARENICLINE TARTRATE 1 MG PO TABS
1.0000 mg | ORAL_TABLET | Freq: Two times a day (BID) | ORAL | 3 refills | Status: AC
  Filled 2024-03-03: qty 60, 30d supply, fill #0

## 2024-03-03 MED ORDER — HYDROCODONE-ACETAMINOPHEN 10-325 MG PO TABS
1.0000 | ORAL_TABLET | Freq: Four times a day (QID) | ORAL | 0 refills | Status: DC | PRN
  Filled 2024-03-03 – 2024-03-10 (×2): qty 120, 30d supply, fill #0

## 2024-03-03 MED ORDER — VARENICLINE TARTRATE (STARTER) 0.5 MG X 11 & 1 MG X 42 PO TBPK
ORAL_TABLET | ORAL | 0 refills | Status: DC
Start: 2024-03-03 — End: 2024-04-05
  Filled 2024-03-03: qty 53, 28d supply, fill #0

## 2024-03-03 MED ORDER — NYSTATIN 100000 UNIT/ML MT SUSP
5.0000 mL | Freq: Four times a day (QID) | OROMUCOSAL | 0 refills | Status: DC
  Filled 2024-03-03: qty 473, 24d supply, fill #0

## 2024-03-03 MED ORDER — VARENICLINE TARTRATE 0.5 MG PO TABS
ORAL_TABLET | ORAL | 0 refills | Status: AC
  Filled 2024-03-03: qty 56, 19d supply, fill #0

## 2024-03-03 MED ORDER — PANTOPRAZOLE SODIUM 40 MG PO TBEC
40.0000 mg | DELAYED_RELEASE_TABLET | Freq: Every day | ORAL | 3 refills | Status: AC
  Filled 2024-03-03: qty 90, 90d supply, fill #0

## 2024-03-03 MED ORDER — TIRZEPATIDE 10 MG/0.5ML ~~LOC~~ SOAJ
10.0000 mg | SUBCUTANEOUS | 0 refills | Status: DC
  Filled 2024-03-03 – 2024-03-04 (×2): qty 2, 28d supply, fill #0

## 2024-03-03 MED ORDER — ROSUVASTATIN CALCIUM 20 MG PO TABS
20.0000 mg | ORAL_TABLET | Freq: Every day | ORAL | 3 refills | Status: AC
  Filled 2024-03-03: qty 90, 90d supply, fill #0

## 2024-03-03 MED ORDER — ALPRAZOLAM 1 MG PO TABS
1.0000 mg | ORAL_TABLET | Freq: Two times a day (BID) | ORAL | 2 refills | Status: DC | PRN
  Filled 2024-03-03 – 2024-03-10 (×3): qty 60, 30d supply, fill #0
  Filled 2024-04-08: qty 60, 30d supply, fill #1
  Filled 2024-05-09 – 2024-05-12 (×2): qty 60, 30d supply, fill #2

## 2024-03-03 NOTE — Patient Instructions (Signed)
 Varenicline Tablets What is this medication? VARENICLINE (var e NI kleen) helps you quit smoking. It reduces cravings for nicotine, the addictive substance found in tobacco. It is most effective when used in combination with a stop-smoking program. This medicine may be used for other purposes; ask your health care provider or pharmacist if you have questions. COMMON BRAND NAME(S): Chantix What should I tell my care team before I take this medication? They need to know if you have any of these conditions: Heart disease Frequently drink alcohol Kidney disease Mental health condition On hemodialysis Seizures History of stroke Suicidal thoughts, plans, or attempt by you or a family member An unusual or allergic reaction to varenicline, other medications, foods, dyes, or preservatives Pregnant or trying to get pregnant Breast-feeding How should I use this medication? Take this medication by mouth after eating. Take with a full glass of water. Follow the directions on the prescription label. Take your doses at regular intervals. Do not take your medication more often than directed. There are 3 ways you can use this medication to help you quit smoking; talk to your care team to decide which plan is right for you: 1) you can choose a quit date and start this medication 1 week before the quit date, or, 2) you can start taking this medication before you choose a quit date, and then pick a quit date between day 8 and 35 days of treatment, or, 3) if you are not sure that you are able or willing to quit smoking right away, start taking this medication and slowly decrease the amount you smoke as directed by your care team with the goal of being cigarette-free by week 12 of treatment. Stick to your plan; ask about support groups or other ways to help you remain cigarette-free. If you are motivated to quit smoking and did not succeed during a previous attempt with this medication for reasons other than side  effects, or if you returned to smoking after this treatment, speak with your care team about whether another course of this medication may be right for you. A special MedGuide will be given to you by the pharmacist with each prescription and refill. Be sure to read this information carefully each time. Talk to your care team about the use of this medication in children. This medication is not approved for use in children. Overdosage: If you think you have taken too much of this medicine contact a poison control center or emergency room at once. NOTE: This medicine is only for you. Do not share this medicine with others. What if I miss a dose? If you miss a dose, take it as soon as you can. If it is almost time for your next dose, take only that dose. Do not take double or extra doses. What may interact with this medication? Alcohol Insulin Other medications used to help people quit smoking Theophylline Warfarin This list may not describe all possible interactions. Give your health care provider a list of all the medicines, herbs, non-prescription drugs, or dietary supplements you use. Also tell them if you smoke, drink alcohol, or use illegal drugs. Some items may interact with your medicine. What should I watch for while using this medication? It is okay if you do not succeed at your attempt to quit and have a cigarette. You can still continue your quit attempt and keep using this medication as directed. Just throw away your cigarettes and get back to your quit plan. Talk to your care team  before using other treatments to quit smoking. Using this medication with other treatments to quit smoking may increase the risk for side effects compared to using a treatment alone. This medication may affect your coordination, reaction time, or judgment. Do not drive or operate machinery until you know how this medication affects you. Sit up or stand slowly to reduce the risk of dizzy or fainting  spells. Decrease the number of alcoholic beverages that you drink during treatment with this medication until you know if this medication affects your ability to tolerate alcohol. Some people have experienced increased drunkenness (intoxication), unusual or sometimes aggressive behavior, or no memory of things that have happened (amnesia) during treatment with this medication. You may do unusual sleep behaviors or activities you do not remember the day after taking this medication. Activities include driving, making or eating food, talking on the phone, sexual activity, or sleep walking. Stop taking this medication and call your care team right away if you find out you have done activities like this. Patients and their families should watch out for new or worsening depression or thoughts of suicide. Also watch out for sudden changes in feelings such as feeling anxious, agitated, panicky, irritable, hostile, aggressive, impulsive, severely restless, overly excited and hyperactive, or not being able to sleep. If this happens, call your care team. If you have diabetes, and you quit smoking, the effects of insulin may be increased. You may need to reduce your insulin dose. Check with your care team about how you should adjust your insulin dose. What side effects may I notice from receiving this medication? Side effects that you should report to your care team as soon as possible: Allergic reactions or angioedema--skin rash, itching or hives, swelling of the face, eyes, lips, tongue, arms, or legs, trouble swallowing or breathing Heart attack--pain or tightness in the chest, shoulders, arms, or jaw, nausea, shortness of breath, cold or clammy skin, feeling faint or lightheaded Mood and behavior changes--anxiety, nervousness, confusion, hallucinations, irritability, hostility, thoughts of suicide or self-harm, worsening mood, feelings of depression Redness, blistering, peeling, or loosening of the skin,  including inside the mouth Stroke--sudden numbness or weakness of the face, arm, or leg, trouble speaking, confusion, trouble walking, loss of balance or coordination, dizziness, severe headache, change in vision Seizures Side effects that usually do not require medical attention (report to your care team if they continue or are bothersome): Constipation Drowsiness Gas Nausea Trouble sleeping Upset stomach Vivid dreams or nightmares Vomiting This list may not describe all possible side effects. Call your doctor for medical advice about side effects. You may report side effects to FDA at 1-800-FDA-1088. Where should I keep my medication? Keep out of the reach of children and pets. Store at room temperature between 15 and 30 degrees C (59 and 86 degrees F). Throw away any unused medication after the expiration date. NOTE: This sheet is a summary. It may not cover all possible information. If you have questions about this medicine, talk to your doctor, pharmacist, or health care provider.  2024 [employer]/Gold Standard (2021-03-27 00:00:00)

## 2024-03-03 NOTE — Progress Notes (Signed)
 Catheline Doing, DNP, AGNP-c [facility] [address] [place], [place] [address] Main Office [phone] VISIT TYPE: CPE on 03/03/2024 Today's Vitals   03/03/24 0936  BP: 124/80  Pulse: 81  SpO2: 95%  Weight: 225 lb 12.8 oz (102.4 kg)  Height: 5' 4.5 (1.638 m)   Body mass index is 38.16 kg/m. BP 124/80   Pulse 81   Ht 5' 4.5 (1.638 m)   Wt 225 lb 12.8 oz (102.4 kg)   SpO2 95%   BMI 38.16 kg/m   Subjective:    Patient ID: Denise Ray, female    DOB: 1970/12/22, 53 y.o.   MRN: [id number]  HPI:  History of Present Illness Denise Ray is a 53 year old female with asthma who presents with exacerbation of asthma symptoms.  She is experiencing an exacerbation of asthma symptoms, with self-reported wheezing and a slight increase in clear mucus production. She has been using her nebulizer and inhaler but has not tried any new treatments recently. Seven weeks ago, she experienced a similar episode and used her mother's prednisone , which alleviated her symptoms, but she is reluctant to use steroids frequently. Her symptoms worsen with physical activity but improve at rest. No chest pain is present, and she does not feel her current symptoms are infectious.  She inquires about the possibility of intermittent FMLA for her asthma, noting that she has not had paperwork filled out for a few years. She estimates needing coverage for exacerbations approximately once a month, lasting about three days each time. She has previously seen a pulmonologist and has been hospitalized for asthma in the past.  She discusses a hip injury sustained from a fall at work in January, resulting in a tear that has not healed due to underlying arthritis and bone spurs. She reports ongoing issues with pain and mobility, and her regular insurance will not cover the surgery needed due to it being a work-related injury.  She smokes about half a pack of cigarettes a day and wants to quit. She  has tried patches, which caused skin irritation, and gums, which she found unpleasant. She is interested in trying Chantix, as she is no longer on antidepressants, which previously contraindicated its use. She has successfully quit smoking in the past for four and a half years by going 'cold [place]' and taking time off work.  She is currently using Mounjaro  for weight management and reports losing almost twenty pounds without side effects. She occasionally uses over-the-counter Mucinex  when she feels mucus is not clearing. No significant changes in her abdomen, though she mentions experiencing embarrassing gas. No hearing or vision changes. FMLA once a month, 3 day span.  Intermittent Has seen pulm Not hosp now, but has been in the past  Pertinent items are noted in HPI.  Most Recent Depression Screen:     03/03/2024    9:36 AM 10/28/2023    9:23 AM 08/26/2023    2:34 PM 02/16/2023    9:22 AM 10/19/2022    4:03 PM  Depression screen PHQ 2/9  Decreased Interest 0 0 0 0 0  Down, Depressed, Hopeless 0 0 0 0 0  PHQ - 2 Score 0 0 0 0 0  Altered sleeping    1   Tired, decreased energy    3   Change in appetite    3   Feeling bad or failure about yourself     0   Trouble concentrating    1   Moving slowly  or fidgety/restless    0   Suicidal thoughts    0   PHQ-9 Score    8   Difficult doing work/chores    Not difficult at all    Most Recent Anxiety Screen:      No data to display         Most Recent Fall Screen:    03/03/2024    9:35 AM 10/28/2023    9:23 AM 08/26/2023    2:33 PM 02/16/2023    9:24 AM 10/19/2022    4:02 PM  Fall Risk   Falls in the past year? 1 0 1 0 1  Number falls in past yr: 0 0 0 0 0  Injury with Fall? 1 1 1  0 0  Comment   rt. hip and shoulder    Risk for fall due to : Other (Comment) No Fall Risks No Fall Risks No Fall Risks Other (Comment)  Risk for fall due to: Comment tripped up the stairs      Follow up Falls evaluation completed Falls evaluation  completed Falls evaluation completed Falls evaluation completed Falls evaluation completed    Past medical history, surgical history, medications, allergies, family history and social history reviewed with patient today and changes made to appropriate areas of the chart.  Past Medical History:  Past Medical History:  Diagnosis Date   Chronic congestive heart failure, unspecified heart failure type (HCC) 07/07/2022   per pcp   Chronic pain    followed by pain clinic   Complication of anesthesia    post op urinary retention   COPD with asthma (HCC)    followed by pcp (previous seen by [facility]) hx exacerbation's  --- ( 10-01-2022 last asthma exacerbation ED visit 03-10-2022 in epic brought by EMS, ARF w/ hypoxmia Bipap,  left AMA last used rescue inhaler a week ago)   DDD (degenerative disc disease), lumbosacral    DOE (dyspnea on exertion)    per pt going flight of stairs but normal daily activites no sob   Family history of adverse reaction to anesthesia    mother -- ponv ;  per pt in 04/ 2021 father had cardiac arrest and Atrial fib intraoperative for circumcision (this was in [place] ) put on venitator for two days then had nuclear stress test per pt showed disease but no cath done, still  has atrial fib;  she also stated prior to this he known cardiac disease with no intervention   GAD (generalized anxiety disorder)    GERD (gastroesophageal reflux disease)    Hemorrhoids    History of acute respiratory failure    06/ 2015  and 12/ 2016  CAP and Asthma exacerbation--  vented both times (ARDS)   History of anal dysplasia    AIN 2/ 3    s/p  surgical excision's    (07/ 2021   s/p excision anal polyp ,  SCCIS)   History of cervical dysplasia    CIN 2 in 2000/   History of chest pain    per pt had cardiology evaluation for chest pain (but pt stated has not had any chest pain or cardiac symtpoms since) referred by pcp  w/ dr delford note in epic 05-22-2019;  pt had nuclear stress  test the showed normal perfusion no ischemia and ef >65% ,  also had echo same day showed normal w/ ef 65%   History of condyloma acuminatum    anal condyloma   s/p excision's  History of pneumonia    hx Required ventilatory support   History of respiratory failure 10/31/2013   History of TIA (transient ischemic attack)    Caused by medication reaction with combination of wellbutrin and estrogen   HSV-2 infection    Hypothyroidism    followed by pcp   MDD (major depressive disorder)    Migraines    OA (osteoarthritis)    bilateral hips w/ pain   OSA on CPAP    10-05-2022 pt stated usually uses nightly however not every night due to seasonall allergies   Peripheral neuropathy    PTSD (post-traumatic stress disorder)    Type 2 diabetes mellitus (HCC)    followed by pcp  (10-05-2022 pt stated does not check blood sugar)   Wears glasses    Medications:  Current Outpatient Medications on File Prior to Visit  Medication Sig   albuterol  (PROVENTIL  HFA;VENTOLIN  HFA) 108 (90 BASE) MCG/ACT inhaler Inhale 2 puffs into the lungs every 6 (six) hours as needed. wheezing   Albuterol  Sulfate 2.5 MG/0.5ML NEBU Inhale into the lungs as needed.   famotidine (PEPCID) 20 MG tablet Take 20 mg by mouth at bedtime.    gabapentin  (NEURONTIN ) 100 MG capsule Take 3 capsules (300 mg total) by mouth 4 (four) times daily.   imiquimod  (ALDARA ) 5 % cream Apply entire packet of cream to affected areas 3 times weekly.  Apply at night and leave on during sleeping hours.  Wash off completely after 8 hours.   levothyroxine  (SYNTHROID ) 25 MCG tablet Take 1 tablet (25 mcg total) by mouth daily.   loratadine  (CLARITIN ) 10 MG tablet Take 10 mg by mouth at bedtime.    Vitamin D , Ergocalciferol , (DRISDOL ) 1.25 MG (50000 UNIT) CAPS capsule Take 1 capsule (50,000 Units total) by mouth every 7 (seven) days.   No current facility-administered medications on file prior to visit.   Surgical History:  Past Surgical History:   Procedure Laterality Date   CESAREAN SECTION  2001   CO2 LASER APPLICATION N/A 10/11/2014   Procedure: CO2 LASER APPLICATION;  Surgeon: Bernarda Ned, MD;  Location: [facility];  Service: General;  Laterality: N/A;   CONDYLOMA EXCISION/FULGURATION N/A 10/14/2022   Procedure: EXCISION OF ANAL CONDYLOMA;  Surgeon: Ned Bernarda, MD;  Location: [facility];  Service: General;  Laterality: N/A;   CONIZATION OF CERVIX  2000   DILATION AND CURETTAGE OF UTERUS     EVALUATION UNDER ANESTHESIA WITH HEMORRHOIDECTOMY N/A 06/15/2014   Procedure: EXAM UNDER ANESTHESIA WITH HEMORRHOIDECTOMY;  Surgeon: Gordy Pina, MD;  Location: [facility];  Service: General;  Laterality: N/A;   EXCISION OF SKIN TAG N/A 06/15/2014   Procedure: EXCISION OF SKIN TAGS;  Surgeon: Gordy Pina, MD;  Location: [facility];  Service: General;  Laterality: N/A;   HIGH RESOLUTION ANOSCOPY N/A 10/11/2014   Procedure: HIGH RESOLUTION ANOSCOPY WITH BIOPSY;  Surgeon: Bernarda Ned, MD;  Location: [facility];  Service: General;  Laterality: N/A;   HYSTEROSCOPY WITH D & C  09/12/2007  &  2005   LASER ABLATION CONDOLAMATA N/A 11/20/2022   Procedure: LASER ABLATION;  Surgeon: Ned Bernarda, MD;  Location: [facility];  Service: General;  Laterality: N/A;  \   MASS EXCISION N/A 12/07/2019   Procedure: EXCISION OF ANAL POLYP AND PERIANAL MOLE;  Surgeon: Ned Bernarda, MD;  Location: [facility];  Service: General;  Laterality: N/A;   RECTAL EXAM UNDER ANESTHESIA N/A 10/11/2014  Procedure: ANAL EXAM UNDER ANESTHESIA;  Surgeon: Bernarda Ned, MD;  Location: [facility] [place];  Service: General;  Laterality: N/A;   RECTAL EXAM UNDER ANESTHESIA N/A 12/18/2015   Procedure: ANAL EXAM UNDER ANESTHESIA  EXCISION ANAL MARGIN LESION;  Surgeon: Bernarda Ned, MD;  Location: [facility] [place];  Service: General;  Laterality: N/A;    RECTAL EXAM UNDER ANESTHESIA N/A 12/07/2019   Procedure: ANAL EXAM UNDER ANESTHESIA;  Surgeon: Ned Bernarda, MD;  Location: [facility] ;  Service: General;  Laterality: N/A;   ROBOTIC ASSISTED TOTAL HYSTERECTOMY N/A 10/11/2012   Procedure: ROBOTIC ASSISTED TOTAL HYSTERECTOMY;  Surgeon: Ronal Elvie Pinal, MD;  Location: [facility];  Service: Gynecology;  Laterality: N/A;   SALPINGOOPHORECTOMY Bilateral 10/11/2012   Procedure: SALPINGO OOPHORECTOMY;  Surgeon: Ronal Elvie Pinal, MD;  Location: [facility];  Service: Gynecology;  Laterality: Bilateral;   TRANSTHORACIC ECHOCARDIOGRAM  05/07/2015   lvsf vigorous, ef 70-75%/  trivial MR and TR   VIDEO ASSISTED THORACOSCOPY (VATS)/THOROCOTOMY Right 11/04/2001   w/ Resection duplication esophageal cyst   Allergies:  Allergies  Allergen Reactions   Oseltamivir Phosphate Other (See Comments) and Cough    BRONCHOCONSTRICTION   Estrogens Other (See Comments)    SYMPTOMS OF A STROKE   Wellbutrin [Bupropion] Other (See Comments)    CAUSED TIA WHEN TAKEN WITH ESTROGEN   Ibuprofen  Other (See Comments)    Avoids due to nosebleeds   Other     Bitrex, used in FIT testing    Morphine  And Codeine Other (See Comments)    DOES NOT RELIEVE PAIN   Family History:  Family History  Problem Relation Age of Onset   Depression Mother    Anesthesia problems Mother        post-op N/V   Alcohol abuse Father    Breast cancer Maternal Aunt    Breast cancer Maternal Grandmother    Alcohol abuse Cousin    Breast cancer Brother 15       Objective:    BP 124/80   Pulse 81   Ht 5' 4.5 (1.638 m)   Wt 225 lb 12.8 oz (102.4 kg)   SpO2 95%   BMI 38.16 kg/m   Wt Readings from Last 3 Encounters:  03/03/24 225 lb 12.8 oz (102.4 kg)  12/09/23 244 lb (110.7 kg)  10/28/23 239 lb (108.4 kg)    Physical Exam Vitals and nursing note reviewed.  Constitutional:      General: She is not in acute distress.    Appearance: Normal appearance. She is  ill-appearing.  HENT:     Head: Normocephalic and atraumatic.     Right Ear: Hearing, tympanic membrane, ear canal and external ear normal.     Left Ear: Hearing, tympanic membrane, ear canal and external ear normal.     Nose: Nose normal.     Right Sinus: No maxillary sinus tenderness or frontal sinus tenderness.     Left Sinus: No maxillary sinus tenderness or frontal sinus tenderness.     Mouth/Throat:     Lips: Pink.     Mouth: Mucous membranes are moist.     Pharynx: Oropharynx is clear.  Eyes:     General: Lids are normal. Vision grossly intact.     Extraocular Movements: Extraocular movements intact.     Pupils: Pupils are equal, round, and reactive to light.     Funduscopic exam:    Right eye: Red reflex present.        Left eye:  Red reflex present.    Visual Fields: Right eye visual fields normal and left eye visual fields normal.  Neck:     Thyroid : No thyromegaly.     Vascular: No carotid bruit.  Cardiovascular:     Rate and Rhythm: Normal rate and regular rhythm.     Chest Wall: PMI is not displaced.     Pulses: Normal pulses.          Dorsalis pedis pulses are 2+ on the right side and 2+ on the left side.       Posterior tibial pulses are 2+ on the right side and 2+ on the left side.     Heart sounds: Normal heart sounds. No murmur heard. Pulmonary:     Effort: Tachypnea and accessory muscle usage present.     Breath sounds: Examination of the right-upper field reveals wheezing. Examination of the left-upper field reveals wheezing. Examination of the right-middle field reveals wheezing. Examination of the left-middle field reveals wheezing. Examination of the right-lower field reveals wheezing. Wheezing and rhonchi present.  Abdominal:     General: Abdomen is flat. Bowel sounds are normal. There is no distension.     Palpations: Abdomen is soft. There is no hepatomegaly, splenomegaly or mass.     Tenderness: There is no abdominal tenderness. There is no right CVA  tenderness, left CVA tenderness, guarding or rebound.  Musculoskeletal:        General: Normal range of motion.     Cervical back: Full passive range of motion without pain, normal range of motion and neck supple. No tenderness.     Right lower leg: No edema.     Left lower leg: No edema.  Feet:     Left foot:     Toenail Condition: Left toenails are normal.  Lymphadenopathy:     Upper Body:     Right upper body: No supraclavicular adenopathy.     Left upper body: No supraclavicular adenopathy.  Skin:    General: Skin is warm and dry.     Capillary Refill: Capillary refill takes less than 2 seconds.     Nails: There is no clubbing.  Neurological:     General: No focal deficit present.     Mental Status: She is alert and oriented to person, place, and time.     GCS: GCS eye subscore is 4. GCS verbal subscore is 5. GCS motor subscore is 6.     Sensory: Sensation is intact.     Motor: Motor function is intact.     Coordination: Coordination is intact.     Gait: Gait is intact.     Deep Tendon Reflexes: Reflexes are normal and symmetric.  Psychiatric:        Attention and Perception: Attention normal.        Mood and Affect: Mood normal.        Speech: Speech normal.        Behavior: Behavior normal. Behavior is cooperative.        Cognition and Memory: Cognition and memory normal.     Results for orders placed or performed in visit on 12/09/23  Surgical pathology( [place]/ [facility])   Collection Time: 12/09/23  2:44 PM  Result Value Ref Range   SURGICAL PATHOLOGY      SURGICAL PATHOLOGY CASE: [id number] PATIENT: Alyia Rode Surgical Pathology Report     Clinical History: vulvar lesion (cf)     FINAL MICROSCOPIC DIAGNOSIS:  A. VULVA, BIOPSY: - Condyloma.  GROSS DESCRIPTION:  Received in formalin is a 0.5 x 0.3 x 0.1 cm fragment of tan soft tissue with possible skin present.  The site of resection is inked blue and the specimen is entirely submitted  in 1 block. (KW, 12/10/2023)  Final Diagnosis performed by Rexene Daily, MD.   Electronically signed 12/14/2023 Technical component performed at [facility]. [facility], [address], [place], [place] [address].  Professional component performed at [facility]. [address] [place], [place] [address]  Immunohistochemistry Technical component (if applicable) was performed at [employer]. [address], [address], [place], [place] [address].  IMMUNOHISTOCHEMISTRY DISCLAIMER (if applicable): Some of these immunohisto chemical stains may have been developed and the performance characteristics determine by [facility]. Some may not have been cleared or approved by the U.S. Food and Drug Administration. The FDA has determined that such clearance or approval is not necessary. This test is used for clinical purposes. It should not be regarded as investigational or for research. This laboratory is certified under the Clinical Laboratory Improvement Amendments of 1988 (CLIA-88) as qualified to perform high complexity clinical laboratory testing.  The controls stained appropriately.        Assessment & Plan:   Problem List Items Addressed This Visit     PTSD (post-traumatic stress disorder)   Relevant Medications   ALPRAZolam  (XANAX ) 1 MG tablet   Acquired hypothyroidism   Relevant Orders   CBC with Differential/Platelet   CMP14+EGFR   Hemoglobin A1c   Lipid panel   TSH   T4, free   VITAMIN D  25 Hydroxy (Vit-D Deficiency, Fractures)   Hepatitis C antibody   OSA (obstructive sleep apnea)   Relevant Medications   tirzepatide  (MOUNJARO ) 10 MG/0.5ML Pen   Other Relevant Orders   CBC with Differential/Platelet   CMP14+EGFR   Hemoglobin A1c   Lipid panel   TSH   T4, free   VITAMIN D  25 Hydroxy (Vit-D Deficiency, Fractures)   Hepatitis C antibody   Diabetes (HCC)   Relevant Medications   rosuvastatin  (CRESTOR )  20 MG tablet   tirzepatide  (MOUNJARO ) 10 MG/0.5ML Pen   Other Relevant Orders   CBC with Differential/Platelet   CMP14+EGFR   Hemoglobin A1c   Lipid panel   TSH   T4, free   VITAMIN D  25 Hydroxy (Vit-D Deficiency, Fractures)   Hepatitis C antibody   AIN grade III   Relevant Orders   CBC with Differential/Platelet   CMP14+EGFR   Hemoglobin A1c   Lipid panel   TSH   T4, free   VITAMIN D  25 Hydroxy (Vit-D Deficiency, Fractures)   Hepatitis C antibody   Chronic obstructive pulmonary disease (HCC)   Relevant Medications   predniSONE  (DELTASONE ) 20 MG tablet   varenicline (CHANTIX) 0.5 MG tablet   varenicline (CHANTIX) 1 MG tablet (Start on 03/31/2024)   Varenicline Tartrate, Starter, (CHANTIX STARTING MONTH PAK) 0.5 MG X 11 & 1 MG X 42 TBPK   Other Relevant Orders   CBC with Differential/Platelet   CMP14+EGFR   Hemoglobin A1c   Lipid panel   TSH   T4, free   VITAMIN D  25 Hydroxy (Vit-D Deficiency, Fractures)   Hepatitis C antibody   Chronic congestive heart failure (HCC)   Relevant Medications   rosuvastatin  (CRESTOR ) 20 MG tablet   Other Relevant Orders   CBC with Differential/Platelet   CMP14+EGFR   Hemoglobin A1c   Lipid panel   TSH   T4, free  VITAMIN D  25 Hydroxy (Vit-D Deficiency, Fractures)   Hepatitis C antibody   Gastroesophageal reflux disease   Relevant Medications   pantoprazole  (PROTONIX ) 40 MG tablet   Chronic pain due to neoplasm   Relevant Medications   predniSONE  (DELTASONE ) 20 MG tablet   HYDROcodone -acetaminophen  (NORCO) 10-325 MG tablet   Other Relevant Orders   CBC with Differential/Platelet   CMP14+EGFR   Hemoglobin A1c   Lipid panel   TSH   T4, free   VITAMIN D  25 Hydroxy (Vit-D Deficiency, Fractures)   Hepatitis C antibody   Generalized anxiety disorder   Relevant Medications   ALPRAZolam  (XANAX ) 1 MG tablet   Other Relevant Orders   CBC with Differential/Platelet   CMP14+EGFR   Hemoglobin A1c   Lipid panel   TSH   T4, free    VITAMIN D  25 Hydroxy (Vit-D Deficiency, Fractures)   Hepatitis C antibody   Obesity, unspecified   Relevant Medications   tirzepatide  (MOUNJARO ) 10 MG/0.5ML Pen   Other Relevant Orders   CBC with Differential/Platelet   CMP14+EGFR   Hemoglobin A1c   Lipid panel   TSH   T4, free   VITAMIN D  25 Hydroxy (Vit-D Deficiency, Fractures)   Hepatitis C antibody   Other Visit Diagnoses       Encounter for annual physical exam    -  Primary   Relevant Orders   CBC with Differential/Platelet   CMP14+EGFR   Hemoglobin A1c   Lipid panel   TSH   T4, free   VITAMIN D  25 Hydroxy (Vit-D Deficiency, Fractures)   Hepatitis C antibody     Encounter for hepatitis C screening test for low risk patient       Relevant Orders   CBC with Differential/Platelet   CMP14+EGFR   Hemoglobin A1c   Lipid panel   TSH   T4, free   VITAMIN D  25 Hydroxy (Vit-D Deficiency, Fractures)   Hepatitis C antibody     Oral thrush       Relevant Medications   nystatin  (MYCOSTATIN ) 100000 UNIT/ML suspension     Asthma with COPD with exacerbation (HCC)       Relevant Medications   predniSONE  (DELTASONE ) 20 MG tablet   varenicline (CHANTIX) 0.5 MG tablet   varenicline (CHANTIX) 1 MG tablet (Start on 03/31/2024)   Varenicline Tartrate, Starter, (CHANTIX STARTING MONTH PAK) 0.5 MG X 11 & 1 MG X 42 TBPK     Hyperlipidemia associated with type 2 diabetes mellitus (HCC)       Relevant Medications   rosuvastatin  (CRESTOR ) 20 MG tablet   tirzepatide  (MOUNJARO ) 10 MG/0.5ML Pen     Chronic neoplasm-related pain       Relevant Medications   predniSONE  (DELTASONE ) 20 MG tablet   HYDROcodone -acetaminophen  (NORCO) 10-325 MG tablet     Encounter for smoking cessation counseling       Relevant Medications   varenicline (CHANTIX) 0.5 MG tablet   varenicline (CHANTIX) 1 MG tablet (Start on 03/31/2024)   Varenicline Tartrate, Starter, (CHANTIX STARTING MONTH PAK) 0.5 MG X 11 & 1 MG X 42 TBPK       COPD/Asthma overlap  with frequent exacerbations Asthma exacerbation likely triggered by seasonal allergies, with wheezing noted, especially in the right lung. She reports using a nebulizer and inhaler but has not taken any recent medications. Previously took her mother's prednisone , which helped, but is hesitant to use steroids frequently. Experiences increased wheezing with movement and has clear mucus that is not moving well.  Considering intermittent FMLA for asthma management. - Prescribe prednisone  burst for 5 days - Provide sample of Breztri for trial - Advise to use Mucinex  as needed - Instruct to message if symptoms worsen or if purulent mucus develops - Complete FMLA paperwork for intermittent leave due to asthma exacerbations - Discuss the option of using Trelegy with a sample provided - Discuss the use of continuous albuterol  treatment through CPAP  Tobacco use Smokes about half a pack a day and expresses a strong desire to quit. Previously tried patches, which caused skin irritation, and gum, which was unpalatable. Interested in trying Chantix, as she is no longer on antidepressants, which previously contraindicated its use. Potential side effect of vivid dreams discussed. Successfully quit smoking for four and a half years in the past and is encouraged to set a quit date and develop a plan to manage cravings. - Prescribe Chantix - Encourage setting a quit date and developing a plan to quit smoking - Discuss potential side effects of Chantix, including vivid dreams  Obesity, class 2 Weight loss attributed to Mounjaro . No side effects reported. Encouraged to maintain regular meals to avoid sickness. Mounjaro  is well-tolerated and has shown benefits in reducing dementia and cardiovascular risks due to its anti-inflammatory effects. Approved for sleep apnea, although insurance coverage is uncertain. - Continue Mounjaro  7.5 mg - Encourage regular meals to avoid sickness - Discuss potential increase to 10 mg  dose  Tear of right hip with underlying osteoarthritis Persistent pain and mobility issues due to a tear in the right hip, exacerbated by underlying osteoarthritis. Surgery is not covered by regular insurance as it is a work-related injury. The tear is directly related to a fall, and the presence of spurs on each side of the tear prevents healing. The only treatment option is hip replacement surgery, which is not currently feasible due to insurance and cost issues.     Follow up plan: Return in about 6 months (around 09/01/2024) for Med Management 30.  NEXT PREVENTATIVE PHYSICAL DUE IN 1 YEAR.  PATIENT COUNSELING PROVIDED FOR ALL ADULT PATIENTS: A well balanced diet low in saturated fats, cholesterol, and moderation in carbohydrates.  This can be as simple as monitoring portion sizes and cutting back on sugary beverages such as soda and juice to start with.    Daily water  consumption of at least 64 ounces.  Physical activity at least 180 minutes per week.  If just starting out, start 10 minutes a day and work your way up.   This can be as simple as taking the stairs instead of the elevator and walking 2-3 laps around the office  purposefully every day.   STD protection, partner selection, and regular testing if high risk.  Limited consumption of alcoholic beverages if alcohol is consumed. For men, I recommend no more than 14 alcoholic beverages per week, spread out throughout the week (max 2 per day). Avoid binge drinking or consuming large quantities of alcohol in one setting.  Please let me know if you feel you may need help with reduction or quitting alcohol consumption.   Avoidance of nicotine , if used. Please let me know if you feel you may need help with reduction or quitting nicotine  use.   Daily mental health attention. This can be in the form of 5 minute daily meditation, prayer, journaling, yoga, reflection, etc.  Purposeful attention to your emotions and mental state  can significantly improve your overall wellbeing  and  Health.  Please know that I am  here to help you with all of your health care goals and am happy to work with you to find a solution that works best for you.  The greatest advice I have received with any changes in life are to take it one step at a time, that even means if all you can focus on is the next 60 seconds, then do that and celebrate your victories.  With any changes in life, you will have set backs, and that is OK. The important thing to remember is, if you have a set back, it is not a failure, it is an opportunity to try again! Screening Testing Mammogram Every 1 -2 years based on history and risk factors Starting at age 25 Pap Smear Ages 21-39 every 3 years Ages 42-65 every 5 years with HPV testing More frequent testing may be required based on results and history Colon Cancer Screening Every 1-10 years based on test performed, risk factors, and history Starting at age 38 Bone Density Screening Every 2-10 years based on history Starting at age 82 for women Recommendations for men differ based on medication usage, history, and risk factors AAA Screening One time ultrasound Men 39-39 years old who have every smoked Lung Cancer Screening Low Dose Lung CT every 12 months Age 28-80 years with a 30 pack-year smoking history who still smoke or who have quit within the last 15 years   Screening Labs Routine  Labs: Complete Blood Count (CBC), Complete Metabolic Panel (CMP), Cholesterol (Lipid Panel) Every 6-12 months based on history and medications May be recommended more frequently based on current conditions or previous results Hemoglobin A1c Lab Every 3-12 months based on history and previous results Starting at age 86 or earlier with diagnosis of diabetes, high cholesterol, BMI >26, and/or risk factors Frequent monitoring for patients with diabetes to ensure blood sugar control Thyroid  Panel (TSH) Every 6 months based  on history, symptoms, and risk factors May be repeated more often if on medication HIV One time testing for all patients 65 and older May be repeated more frequently for patients with increased risk factors or exposure Hepatitis C One time testing for all patients 81 and older May be repeated more frequently for patients with increased risk factors or exposure Gonorrhea, Chlamydia Every 12 months for all sexually active persons 13-24 years Additional monitoring may be recommended for those who are considered high risk or who have symptoms Every 12 months for any woman on birth control, regardless of sexual activity PSA Men 70-10 years old with risk factors Additional screening may be recommended from age 11-69 based on risk factors, symptoms, and history  Vaccine Recommendations Tetanus Booster All adults every 10 years Flu Vaccine All patients 6 months and older every year COVID Vaccine All patients 12 years and older Initial dosing with booster May recommend additional booster based on age and health history HPV Vaccine 2 doses all patients age 25-26 Dosing may be considered for patients over 26 Shingles Vaccine (Shingrix) 2 doses all adults 55 years and older Pneumonia (Pneumovax 84) All adults 65 years and older May recommend earlier dosing based on health history One year apart from Prevnar 33 Pneumonia (Prevnar 1) All adults 65 years and older Dosed 1 year after Pneumovax 23 Pneumonia (Prevnar 20) One time alternative to the two dosing of 13 and 23 For all adults with initial dose of 23, 20 is recommended 1 year later For all adults with initial dose of 13, 23 is still recommended as second  option 1 year later

## 2024-03-04 ENCOUNTER — Other Ambulatory Visit (HOSPITAL_COMMUNITY): Payer: Self-pay

## 2024-03-04 ENCOUNTER — Other Ambulatory Visit: Payer: Self-pay | Admitting: Nurse Practitioner

## 2024-03-04 DIAGNOSIS — F3342 Major depressive disorder, recurrent, in full remission: Secondary | ICD-10-CM

## 2024-03-04 LAB — LIPID PANEL
Chol/HDL Ratio: 6.4 ratio — ABNORMAL HIGH (ref 0.0–4.4)
Cholesterol, Total: 180 mg/dL (ref 100–199)
HDL: 28 mg/dL — ABNORMAL LOW (ref >39)
LDL Chol Calc (NIH): 69 mg/dL (ref 0–99)
Triglycerides: 537 mg/dL — ABNORMAL HIGH (ref 0–149)
VLDL Cholesterol Cal: 83 mg/dL — ABNORMAL HIGH (ref 5–40)

## 2024-03-04 LAB — TSH: TSH: 5.45 u[IU]/mL — AB (ref 0.450–4.500)

## 2024-03-04 LAB — CBC WITH DIFFERENTIAL/PLATELET
Basophils Absolute: 0.1 x10E3/uL (ref 0.0–0.2)
Basos: 1 %
EOS (ABSOLUTE): 0.2 x10E3/uL (ref 0.0–0.4)
Eos: 2 %
Hematocrit: 43.5 % (ref 34.0–46.6)
Hemoglobin: 14.4 g/dL (ref 11.1–15.9)
Immature Grans (Abs): 0 x10E3/uL (ref 0.0–0.1)
Immature Granulocytes: 0 %
Lymphocytes Absolute: 2.8 x10E3/uL (ref 0.7–3.1)
Lymphs: 32 %
MCH: 30.4 pg (ref 26.6–33.0)
MCHC: 33.1 g/dL (ref 31.5–35.7)
MCV: 92 fL (ref 79–97)
Monocytes Absolute: 0.6 x10E3/uL (ref 0.1–0.9)
Monocytes: 7 %
Neutrophils Absolute: 5.1 x10E3/uL (ref 1.4–7.0)
Neutrophils: 58 %
Platelets: 305 x10E3/uL (ref 150–450)
RBC: 4.73 x10E6/uL (ref 3.77–5.28)
RDW: 13.8 % (ref 11.7–15.4)
WBC: 8.8 x10E3/uL (ref 3.4–10.8)

## 2024-03-04 LAB — CMP14+EGFR
ALT: 12 IU/L (ref 0–32)
AST: 13 IU/L (ref 0–40)
Albumin: 4.1 g/dL (ref 3.8–4.9)
Alkaline Phosphatase: 96 IU/L (ref 49–135)
BUN/Creatinine Ratio: 13 (ref 9–23)
BUN: 9 mg/dL (ref 6–24)
Bilirubin Total: 0.2 mg/dL (ref 0.0–1.2)
CO2: 23 mmol/L (ref 20–29)
Calcium: 9 mg/dL (ref 8.7–10.2)
Chloride: 103 mmol/L (ref 96–106)
Creatinine, Ser: 0.72 mg/dL (ref 0.57–1.00)
Globulin, Total: 3 g/dL (ref 1.5–4.5)
Glucose: 121 mg/dL — ABNORMAL HIGH (ref 70–99)
Potassium: 3.5 mmol/L (ref 3.5–5.2)
Sodium: 142 mmol/L (ref 134–144)
Total Protein: 7.1 g/dL (ref 6.0–8.5)
eGFR: 101 mL/min/1.73 (ref >59)

## 2024-03-04 LAB — HEPATITIS C ANTIBODY: Hep C Virus Ab: NONREACTIVE

## 2024-03-04 LAB — HEMOGLOBIN A1C
Est. average glucose Bld gHb Est-mCnc: 120 mg/dL
Hgb A1c MFr Bld: 5.8 % — ABNORMAL HIGH (ref 4.8–5.6)

## 2024-03-04 LAB — VITAMIN D 25 HYDROXY (VIT D DEFICIENCY, FRACTURES): Vit D, 25-Hydroxy: 17.9 ng/mL — AB (ref 30.0–100.0)

## 2024-03-04 LAB — T4, FREE: Free T4: 0.82 ng/dL (ref 0.82–1.77)

## 2024-03-06 ENCOUNTER — Ambulatory Visit: Payer: Self-pay | Admitting: Nurse Practitioner

## 2024-03-06 ENCOUNTER — Other Ambulatory Visit (HOSPITAL_COMMUNITY): Payer: Self-pay

## 2024-03-06 DIAGNOSIS — E559 Vitamin D deficiency, unspecified: Secondary | ICD-10-CM | POA: Insufficient documentation

## 2024-03-06 DIAGNOSIS — E039 Hypothyroidism, unspecified: Secondary | ICD-10-CM

## 2024-03-06 MED ORDER — LEVOTHYROXINE SODIUM 50 MCG PO TABS
50.0000 ug | ORAL_TABLET | Freq: Every day | ORAL | 3 refills | Status: AC
  Filled 2024-03-06: qty 90, 90d supply, fill #0

## 2024-03-06 NOTE — Telephone Encounter (Signed)
 Last appt. 03/03/24.

## 2024-03-07 ENCOUNTER — Telehealth: Payer: Self-pay | Admitting: Nurse Practitioner

## 2024-03-07 ENCOUNTER — Other Ambulatory Visit (HOSPITAL_COMMUNITY): Payer: Self-pay

## 2024-03-07 MED ORDER — GABAPENTIN 100 MG PO CAPS
300.0000 mg | ORAL_CAPSULE | Freq: Four times a day (QID) | ORAL | 11 refills | Status: AC
  Filled 2024-03-07: qty 360, 30d supply, fill #0
  Filled 2024-04-08: qty 360, 30d supply, fill #1
  Filled 2024-05-09 – 2024-05-12 (×2): qty 360, 30d supply, fill #2
  Filled 2024-06-10: qty 360, 30d supply, fill #3

## 2024-03-07 NOTE — Telephone Encounter (Signed)
 [employer] forms received and sent back in folder , Return to Arabela Basaldua for form fee

## 2024-03-09 ENCOUNTER — Other Ambulatory Visit (HOSPITAL_COMMUNITY): Payer: Self-pay

## 2024-03-10 ENCOUNTER — Other Ambulatory Visit (HOSPITAL_COMMUNITY): Payer: Self-pay

## 2024-03-11 ENCOUNTER — Other Ambulatory Visit (HOSPITAL_COMMUNITY): Payer: Self-pay

## 2024-03-13 NOTE — Telephone Encounter (Signed)
 The patient called back in checking on the status of her FMLA forms. I spoke with Rosina and she said the forms are in the folder with the provider and as soon as they are filled out they will give the patient a call to let her know they are ready and also about the fee to have them filled out. I relayed to the patient and she was appreciative and understanding. Please assist patient further.

## 2024-03-16 ENCOUNTER — Other Ambulatory Visit (HOSPITAL_COMMUNITY): Payer: Self-pay

## 2024-03-16 ENCOUNTER — Other Ambulatory Visit: Payer: Self-pay | Admitting: Nurse Practitioner

## 2024-03-16 DIAGNOSIS — A630 Anogenital (venereal) warts: Secondary | ICD-10-CM | POA: Insufficient documentation

## 2024-03-16 DIAGNOSIS — R140 Abdominal distension (gaseous): Secondary | ICD-10-CM | POA: Insufficient documentation

## 2024-03-16 DIAGNOSIS — J4489 Other specified chronic obstructive pulmonary disease: Secondary | ICD-10-CM

## 2024-03-16 DIAGNOSIS — K625 Hemorrhage of anus and rectum: Secondary | ICD-10-CM | POA: Insufficient documentation

## 2024-03-16 DIAGNOSIS — K5900 Constipation, unspecified: Secondary | ICD-10-CM | POA: Insufficient documentation

## 2024-03-16 DIAGNOSIS — J411 Mucopurulent chronic bronchitis: Secondary | ICD-10-CM

## 2024-03-16 DIAGNOSIS — R1310 Dysphagia, unspecified: Secondary | ICD-10-CM | POA: Insufficient documentation

## 2024-03-16 NOTE — Progress Notes (Signed)
 Fmla paperwork completed.

## 2024-03-16 NOTE — Telephone Encounter (Signed)
 [employer] forms were faxed back today

## 2024-03-17 ENCOUNTER — Telehealth: Payer: Self-pay

## 2024-03-17 NOTE — Telephone Encounter (Signed)
 Preoperative Clearance Form in your folder, She had CPE on 03/03/24.

## 2024-04-05 ENCOUNTER — Other Ambulatory Visit (HOSPITAL_COMMUNITY): Payer: Self-pay

## 2024-04-05 ENCOUNTER — Other Ambulatory Visit: Payer: Self-pay | Admitting: Nurse Practitioner

## 2024-04-05 DIAGNOSIS — E1169 Type 2 diabetes mellitus with other specified complication: Secondary | ICD-10-CM

## 2024-04-05 DIAGNOSIS — G4733 Obstructive sleep apnea (adult) (pediatric): Secondary | ICD-10-CM

## 2024-04-05 DIAGNOSIS — G893 Neoplasm related pain (acute) (chronic): Secondary | ICD-10-CM

## 2024-04-05 DIAGNOSIS — Z716 Tobacco abuse counseling: Secondary | ICD-10-CM

## 2024-04-05 NOTE — Telephone Encounter (Signed)
 Last apt 03/03/24.

## 2024-04-10 ENCOUNTER — Other Ambulatory Visit: Payer: Self-pay

## 2024-04-10 MED ORDER — HYDROCODONE-ACETAMINOPHEN 10-325 MG PO TABS
1.0000 | ORAL_TABLET | Freq: Four times a day (QID) | ORAL | 0 refills | Status: DC | PRN
  Filled 2024-04-10: qty 120, 30d supply, fill #0

## 2024-04-10 MED ORDER — MOUNJARO 10 MG/0.5ML ~~LOC~~ SOAJ
10.0000 mg | SUBCUTANEOUS | 0 refills | Status: DC
  Filled 2024-04-10: qty 2, 28d supply, fill #0

## 2024-04-10 MED ORDER — VARENICLINE TARTRATE (STARTER) 0.5 MG X 11 & 1 MG X 42 PO TBPK
ORAL_TABLET | ORAL | 0 refills | Status: AC
Start: 2024-04-10 — End: 2024-05-12
  Filled 2024-04-10: qty 53, 28d supply, fill #0

## 2024-04-11 ENCOUNTER — Other Ambulatory Visit (HOSPITAL_COMMUNITY): Payer: Self-pay

## 2024-05-09 ENCOUNTER — Other Ambulatory Visit (HOSPITAL_COMMUNITY): Payer: Self-pay

## 2024-05-09 ENCOUNTER — Other Ambulatory Visit: Payer: Self-pay

## 2024-05-09 ENCOUNTER — Other Ambulatory Visit: Payer: Self-pay | Admitting: Nurse Practitioner

## 2024-05-09 DIAGNOSIS — E559 Vitamin D deficiency, unspecified: Secondary | ICD-10-CM

## 2024-05-09 DIAGNOSIS — G4733 Obstructive sleep apnea (adult) (pediatric): Secondary | ICD-10-CM

## 2024-05-09 DIAGNOSIS — G893 Neoplasm related pain (acute) (chronic): Secondary | ICD-10-CM

## 2024-05-09 DIAGNOSIS — E1169 Type 2 diabetes mellitus with other specified complication: Secondary | ICD-10-CM

## 2024-05-09 MED ORDER — MOUNJARO 10 MG/0.5ML ~~LOC~~ SOAJ
10.0000 mg | SUBCUTANEOUS | 0 refills | Status: DC
  Filled 2024-05-09: qty 2, 28d supply, fill #0

## 2024-05-09 MED ORDER — VITAMIN D (ERGOCALCIFEROL) 1.25 MG (50000 UNIT) PO CAPS
50000.0000 [IU] | ORAL_CAPSULE | ORAL | 1 refills | Status: DC
  Filled 2024-05-09: qty 12, 84d supply, fill #0

## 2024-05-09 NOTE — Telephone Encounter (Signed)
 Last appt. 03/03/24.

## 2024-05-10 ENCOUNTER — Other Ambulatory Visit (HOSPITAL_COMMUNITY): Payer: Self-pay

## 2024-05-10 MED ORDER — HYDROCODONE-ACETAMINOPHEN 10-325 MG PO TABS
1.0000 | ORAL_TABLET | Freq: Four times a day (QID) | ORAL | 0 refills | Status: DC | PRN
  Filled 2024-05-10: qty 120, 30d supply, fill #0

## 2024-05-12 ENCOUNTER — Other Ambulatory Visit: Payer: Self-pay

## 2024-05-24 NOTE — H&P (Signed)
 TOTAL HIP ADMISSION H&P  Patient is admitted for right total hip arthroplasty.  Subjective:  Chief Complaint: Right hip pain  HPI: Denise Ray, 54 y.o. female, has a history of pain and functional disability in the right hip due to arthritis and patient has failed non-surgical conservative treatments for greater than 12 weeks to include NSAID's and/or analgesics, corticosteriod injections, and activity modification. Onset of symptoms was abrupt, starting 1 years ago with rapidlly worsening course since that time. The patient noted no past surgery on the right hip. Patient currently rates pain in the right hip at 8 out of 10 with activity. Patient has worsening of pain with activity and weight bearing and pain that interfers with activities of daily living. Patient has evidence of significant pincer impingement with a global labral tear and displacement, accompanied by large osteophytes and significant joint space narrowing  by imaging studies. This condition presents safety issues increasing the risk of falls. There is no current active infection.  Patient Active Problem List   Diagnosis Date Noted   Dysphagia 03/16/2024   Bloating 03/16/2024   Hemorrhage of anus and rectum 03/16/2024   Anogenital (venereal) warts 03/16/2024   Constipation, unspecified 03/16/2024   Vitamin D  deficiency 03/06/2024   Acute right hip pain 08/26/2023   Generalized anxiety disorder 02/18/2023   Peripheral polyneuropathy 02/18/2023   Chronic congestive heart failure (HCC) 07/07/2022   Coronary artery disease involving coronary bypass graft of native heart with angina pectoris 07/07/2022   Family history of breast cancer 04/07/2021   Smoking trying to quit 04/07/2021   AIN grade III 12/09/2020   Chronic pain due to neoplasm 03/24/2017   Gastroesophageal reflux disease 12/13/2016   Diabetes (HCC) 09/11/2016   OSA (obstructive sleep apnea) 11/10/2013   History of acute respiratory failure 10/31/2013    History of pneumonia 10/26/2013   Acquired hypothyroidism 10/26/2013   Obesity, unspecified 10/26/2013   COPD with asthma (HCC) 10/26/2013   PTSD (post-traumatic stress disorder) 01/18/2013   Depression, major, recurrent 01/18/2013    Past Medical History:  Diagnosis Date   Chronic congestive heart failure, unspecified heart failure type (HCC) 07/07/2022   per pcp   Chronic pain    followed by pain clinic   Complication of anesthesia    post op urinary retention   COPD with asthma (HCC)    followed by pcp (previous seen by [facility]) hx exacerbation's  --- ( 10-01-2022 last asthma exacerbation ED visit 03-10-2022 in epic brought by EMS, ARF w/ hypoxmia Bipap,  left AMA last used rescue inhaler a week ago)   DDD (degenerative disc disease), lumbosacral    DOE (dyspnea on exertion)    per pt going flight of stairs but normal daily activites no sob   Family history of adverse reaction to anesthesia    mother -- ponv ;  per pt in 04/ 2021 father had cardiac arrest and Atrial fib intraoperative for circumcision (this was in [place] ) put on venitator for two days then had nuclear stress test per pt showed disease but no cath done, still  has atrial fib;  she also stated prior to this he known cardiac disease with no intervention   GAD (generalized anxiety disorder)    GERD (gastroesophageal reflux disease)    Hemorrhoids    History of acute respiratory failure    06/ 2015  and 12/ 2016  CAP and Asthma exacerbation--  vented both times (ARDS)   History of anal dysplasia    AIN  2/ 3    s/p  surgical excision's    (07/ 2021   s/p excision anal polyp ,  SCCIS)   History of cervical dysplasia    CIN 2 in 2000/   History of chest pain    per pt had cardiology evaluation for chest pain (but pt stated has not had any chest pain or cardiac symtpoms since) referred by pcp  w/ dr delford note in epic 05-22-2019;  pt had nuclear stress test the showed normal perfusion no ischemia and ef >65% ,   also had echo same day showed normal w/ ef 65%   History of condyloma acuminatum    anal condyloma   s/p excision's   History of pneumonia    hx Required ventilatory support   History of respiratory failure 10/31/2013   History of TIA (transient ischemic attack)    Caused by medication reaction with combination of wellbutrin and estrogen   HSV-2 infection    Hypothyroidism    followed by pcp   MDD (major depressive disorder)    Migraines    OA (osteoarthritis)    bilateral hips w/ pain   OSA on CPAP    10-05-2022 pt stated usually uses nightly however not every night due to seasonall allergies   Peripheral neuropathy    PTSD (post-traumatic stress disorder)    Type 2 diabetes mellitus (HCC)    followed by pcp  (10-05-2022 pt stated does not check blood sugar)   Wears glasses     Past Surgical History:  Procedure Laterality Date   CESAREAN SECTION  2001   CO2 LASER APPLICATION N/A 10/11/2014   Procedure: CO2 LASER APPLICATION;  Surgeon: Bernarda Ned, MD;  Location: [facility] [place];  Service: General;  Laterality: N/A;   CONDYLOMA EXCISION/FULGURATION N/A 10/14/2022   Procedure: EXCISION OF ANAL CONDYLOMA;  Surgeon: Ned Bernarda, MD;  Location: [facility];  Service: General;  Laterality: N/A;   CONIZATION OF CERVIX  2000   DILATION AND CURETTAGE OF UTERUS     EVALUATION UNDER ANESTHESIA WITH HEMORRHOIDECTOMY N/A 06/15/2014   Procedure: EXAM UNDER ANESTHESIA WITH HEMORRHOIDECTOMY;  Surgeon: Gordy Pina, MD;  Location: [facility];  Service: General;  Laterality: N/A;   EXCISION OF SKIN TAG N/A 06/15/2014   Procedure: EXCISION OF SKIN TAGS;  Surgeon: Gordy Pina, MD;  Location:  SURGERY CENTER;  Service: General;  Laterality: N/A;   HIGH RESOLUTION ANOSCOPY N/A 10/11/2014   Procedure: HIGH RESOLUTION ANOSCOPY WITH BIOPSY;  Surgeon: Bernarda Ned, MD;  Location: [facility] [place];  Service: General;  Laterality: N/A;    HYSTEROSCOPY WITH D & C  09/12/2007  &  2005   LASER ABLATION CONDOLAMATA N/A 11/20/2022   Procedure: LASER ABLATION;  Surgeon: Ned Bernarda, MD;  Location: [facility] [place];  Service: General;  Laterality: N/A;  \   MASS EXCISION N/A 12/07/2019   Procedure: EXCISION OF ANAL POLYP AND PERIANAL MOLE;  Surgeon: Ned Bernarda, MD;  Location: [facility] [place];  Service: General;  Laterality: N/A;   RECTAL EXAM UNDER ANESTHESIA N/A 10/11/2014   Procedure: ANAL EXAM UNDER ANESTHESIA;  Surgeon: Bernarda Ned, MD;  Location: [facility] [place];  Service: General;  Laterality: N/A;   RECTAL EXAM UNDER ANESTHESIA N/A 12/18/2015   Procedure: ANAL EXAM UNDER ANESTHESIA  EXCISION ANAL MARGIN LESION;  Surgeon: Bernarda Ned, MD;  Location: [facility];  Service: General;  Laterality: N/A;   RECTAL EXAM UNDER ANESTHESIA  N/A 12/07/2019   Procedure: ANAL EXAM UNDER ANESTHESIA;  Surgeon: Debby Hila, MD;  Location: [facility];  Service: General;  Laterality: N/A;   ROBOTIC ASSISTED TOTAL HYSTERECTOMY N/A 10/11/2012   Procedure: ROBOTIC ASSISTED TOTAL HYSTERECTOMY;  Surgeon: Ronal Elvie Pinal, MD;  Location: [facility];  Service: Gynecology;  Laterality: N/A;   SALPINGOOPHORECTOMY Bilateral 10/11/2012   Procedure: SALPINGO OOPHORECTOMY;  Surgeon: Ronal Elvie Pinal, MD;  Location: [facility];  Service: Gynecology;  Laterality: Bilateral;   TRANSTHORACIC ECHOCARDIOGRAM  05/07/2015   lvsf vigorous, ef 70-75%/  trivial MR and TR   VIDEO ASSISTED THORACOSCOPY (VATS)/THOROCOTOMY Right 11/04/2001   w/ Resection duplication esophageal cyst    Prior to Admission medications  Medication Sig Start Date End Date Taking? Authorizing Provider  albuterol  (PROVENTIL  HFA;VENTOLIN  HFA) 108 (90 BASE) MCG/ACT inhaler Inhale 2 puffs into the lungs every 6 (six) hours as needed. wheezing 01/06/13   Pennie Elsie PARAS, FNP  Albuterol  Sulfate 2.5 MG/0.5ML NEBU Inhale into the  lungs as needed.    [provider]  ALPRAZolam  (XANAX ) 1 MG tablet Take 1 tablet (1 mg total) by mouth 2 (two) times daily as needed. 03/03/24   Early, Sara E, NP  aspirin  (ASPIRIN  81) 81 MG chewable tablet Chew 81 mg by mouth daily.    [provider]  famotidine (PEPCID) 20 MG tablet Take 20 mg by mouth at bedtime.     [provider]  gabapentin  (NEURONTIN ) 100 MG capsule Take 3 capsules (300 mg total) by mouth 4 (four) times daily. 03/07/24   Early, Sara E, NP  HYDROcodone -acetaminophen  (NORCO) 10-325 MG tablet Take 1 tablet by mouth every 6 hours as needed. 05/10/24   Early, Sara E, NP  imiquimod  (ALDARA ) 5 % cream Apply entire packet of cream to affected areas 3 times weekly.  Apply at night and leave on during sleeping hours.  Wash off completely after 8 hours. 10/29/23   Pinal Ronal RAMAN, MD  levothyroxine  (SYNTHROID ) 50 MCG tablet Take 1 tablet (50 mcg total) by mouth daily. Take on an empty stomach 30 minutes prior to eating or other medications. 03/06/24   Early, Sara E, NP  loratadine  (CLARITIN ) 10 MG tablet Take 10 mg by mouth at bedtime.     [provider]  nystatin  (MYCOSTATIN ) 100000 UNIT/ML suspension Take 5 mLs (500,000 Units total) by mouth 4 (four) times daily. X 1 week. Swish and hold in mouth for at least 2 minutes and then swallow. 03/03/24   Early, Sara E, NP  pantoprazole  (PROTONIX ) 40 MG tablet Take 1 tablet (40 mg total) by mouth daily. 03/03/24   Early, Sara E, NP  predniSONE  (DELTASONE ) 20 MG tablet Take 2 tablets (40 mg total) by mouth daily with breakfast. 03/03/24   Early, Camie BRAVO, NP  rosuvastatin  (CRESTOR ) 20 MG tablet Take 1 tablet (20 mg total) by mouth at bedtime. 03/03/24   Early, Sara E, NP  tirzepatide  (MOUNJARO ) 10 MG/0.5ML Pen Inject 10 mg into the skin once a week. 05/09/24   Early, Sara E, NP  varenicline  (CHANTIX ) 1 MG tablet Take 1 tablet (1 mg total) by mouth 2 (two) times daily. 03/31/24   Early, Sara E, NP  Vitamin D ,  Ergocalciferol , (DRISDOL ) 1.25 MG (50000 UNIT) CAPS capsule Take 1 capsule (50,000 Units total) by mouth every 7 (seven) days. 05/09/24   Oris Camie BRAVO, NP    Allergies[1]  Social History   Socioeconomic History   Marital status: Divorced    Spouse  name: Not on file   Number of children: 1   Years of education: Not on file   Highest education level: Not on file  Occupational History   Occupation: Respiratory Therapist    Employer: [employer]  Tobacco Use   Smoking status: Every Day    Current packs/day: 1.00    Average packs/day: 1 pack/day for 25.0 years (25.0 ttl pk-yrs)    Types: Cigarettes   Smokeless tobacco: Never  Vaping Use   Vaping status: Never Used  Substance and Sexual Activity   Alcohol use: Not Currently    Comment: Rarely   Drug use: Never   Sexual activity: Yes    Partners: Male    Birth control/protection: Surgical    Comment: Hysterectomy  Other Topics Concern   Not on file  Social History Narrative   Divorced.  Mother and son live with her.   Social Drivers of Health   Tobacco Use: High Risk (03/03/2024)   Patient History    Smoking Tobacco Use: Every Day    Smokeless Tobacco Use: Never    Passive Exposure: Not on file  Financial Resource Strain: Low Risk (10/22/2022)   Overall Financial Resource Strain (CARDIA)    Difficulty of Paying Living Expenses: Not very hard  Food Insecurity: No Food Insecurity (10/22/2022)   Hunger Vital Sign    Worried About Running Out of Food in the Last Year: Never true    Ran Out of Food in the Last Year: Never true  Transportation Needs: No Transportation Needs (10/22/2022)   PRAPARE - Administrator, Civil Service (Medical): No    Lack of Transportation (Non-Medical): No  Physical Activity: Not on file  Stress: Stress Concern Present (10/22/2022)   [employer] of Occupational Health - Occupational Stress Questionnaire    Feeling of Stress : Very much  Social Connections: Socially  Isolated (10/22/2022)   Social Connection and Isolation Panel    Frequency of Communication with Friends and Family: Twice a week    Frequency of Social Gatherings with Friends and Family: Never    Attends Religious Services: Never    Database Administrator or Organizations: No    Attends Banker Meetings: Never    Marital Status: Divorced  Catering Manager Violence: Not At Risk (10/22/2022)   Humiliation, Afraid, Rape, and Kick questionnaire    Fear of Current or Ex-Partner: No    Emotionally Abused: No    Physically Abused: No    Sexually Abused: No  Depression (PHQ2-9): Low Risk (03/03/2024)   Depression (PHQ2-9)    PHQ-2 Score: 0  Alcohol Screen: Not on file  Housing: Unknown (09/22/2023)   Received from [facility]   Epic    Unable to Pay for Housing in the Last Year: Not on file    Number of Times Moved in the Last Year: Not on file    At any time in the past 12 months, were you homeless or living in a shelter (including now)?: No  Utilities: Not on file  Health Literacy: Not on file    Tobacco Use: High Risk (03/03/2024)   Patient History    Smoking Tobacco Use: Every Day    Smokeless Tobacco Use: Never    Passive Exposure: Not on file   Social History   Substance and Sexual Activity  Alcohol Use Not Currently   Comment: Rarely    Family History  Problem Relation Age of Onset   Depression  Mother    Anesthesia problems Mother        post-op N/V   Alcohol abuse Father    Breast cancer Maternal Aunt    Breast cancer Maternal Grandmother    Alcohol abuse Cousin    Breast cancer Brother 15    ROS   Objective:  Physical Exam: - Well-developed female, alert, oriented, and in no apparent distress.  - Evaluation of the right hip demonstrates flexion to 100 degrees, minimal internal rotation of approximately 10 degrees, external rotation of 20 degrees, and abduction with pain.  - Antalgic gait pattern noted on the  right.  IMAGING: MRI of the right hip demonstrates significant pincer impingement with a global labral tear and displacement, accompanied by large osteophytes. There is significant joint space narrowing adjacent to the osteophytes. No gluteal tendon tears are observed.  Assessment/Plan:  End stage arthritis, right hip  The patient history, physical examination, clinical judgement of the provider and imaging studies are consistent with end stage degenerative joint disease of the right hip and total hip arthroplasty is deemed medically necessary. The treatment options including medical management, injection therapy, arthroscopy and arthroplasty were discussed at length. The risks and benefits of total hip arthroplasty were presented and reviewed. The risks due to aseptic loosening, infection, stiffness, dislocation/subluxation, thromboembolic complications and other imponderables were discussed. The patient acknowledged the explanation, agreed to proceed with the plan and consent was signed. Patient is being admitted for inpatient treatment for surgery, pain control, PT, OT, prophylactic antibiotics, VTE prophylaxis, progressive ambulation and ADLs and discharge planning.The patient is planning to be discharged home.   Patient's anticipated LOS is less than 2 midnights, meeting these requirements: - Younger than 57 - Lives within 1 hour of care - Has a competent adult at home to recover with post-op recover - NO history of  - Coronary Artery Disease  - Heart failure  - Heart attack  - Stroke  - DVT/VTE  - Cardiac arrhythmia  - Respiratory Failure/COPD  - Renal failure  - Anemia  - Advanced Liver disease  Therapy Plans: HEP Disposition: Home with son for 1 week, then to [place] with her parents. Planned DVT Prophylaxis: Xarelto 10mg  (hx cervical and anal cancer) DME Needed: None PCP: Camie Doing, NP (clearance pending)** TXA: Topical Allergies: Wellbutrin, estrogens, ibuprofen  (nose  bleeds), morphine  (ineffective for pain, decreased RR), tamiflu Anesthesia Concerns: Sleep apnea BMI: 38.8 Last HgbA1c: 5.8% on 03/03/24 Pain Regimen: Norco 10mg  QID with oxycodone  for breakthrough pain Pharmacy: [facility], deliver to room   Other: -Pt reports active anal cancer. Under the care of general surgeon, Bernarda Ned, with serial excisions. -Pt will bring medical clearance form to PCP -Hx TIA >15 years ago -Taking Norco 10/325mg  2-4x / day -Stop mounjaro  1 week before surgery    - Patient was instructed on what medications to stop prior to surgery. - Follow-up visit in 2 weeks with Dr. Melodi - Begin physical therapy following surgery - Pre-operative lab work as pre-surgical testing - Prescriptions will be provided in hospital at time of discharge  Corean Sender, PA-C Orthopedic Surgery [facility] [place]      [1]  Allergies Allergen Reactions   Oseltamivir Phosphate Other (See Comments) and Cough    BRONCHOCONSTRICTION   Estrogens Other (See Comments)    SYMPTOMS OF A STROKE   Wellbutrin [Bupropion] Other (See Comments)    CAUSED TIA WHEN TAKEN WITH ESTROGEN   Ibuprofen  Other (See Comments)    Avoids due to  nosebleeds   Morphine  Other (See Comments)    Substance with morphinan structure and opioid receptor agonist mechanism of action (substance)   Other     Bitrex, used in FIT testing    Morphine  And Codeine Other (See Comments)    DOES NOT RELIEVE PAIN

## 2024-05-31 ENCOUNTER — Encounter (HOSPITAL_COMMUNITY)

## 2024-06-01 NOTE — Patient Instructions (Addendum)
 SURGICAL WAITING ROOM VISITATION  Patients having surgery or a procedure may have no more than 2 support people in the waiting area - these visitors may rotate.    Children ages 23 and under will not be able to visit patients in [facility] under most circumstances.   Visitors with respiratory illnesses are discouraged from visiting and should remain at home.  If the patient needs to stay at the hospital during part of their recovery, the visitor guidelines for inpatient rooms apply. Pre-op nurse will coordinate an appropriate time for 1 support person to accompany patient in pre-op.  This support person may not rotate.    Please refer to the [facility] website for the visitor guidelines for Inpatients (after your surgery is over and you are in a regular room).    Your procedure is scheduled on: 06/07/24   Report to [facility] Main Entrance    Report to admitting at 9:45 AM   Call this number if you have problems the morning of surgery [phone]   Do not eat food :After Midnight.   After Midnight you may have the following liquids until 9:15 AM DAY OF SURGERY  Water  Non-Citrus Juices (without pulp, NO RED-Apple, White grape, White cranberry) Black Coffee (NO MILK/CREAM OR CREAMERS, sugar ok)  Clear Tea (NO MILK/CREAM OR CREAMERS, sugar ok) regular and decaf                             Plain Jell-O (NO RED)                                           Fruit ices (not with fruit pulp, NO RED)                                     Popsicles (NO RED)                                                               Sports drinks like Gatorade (NO RED)                 The day of surgery:  Drink ONE (1) Pre-Surgery G2 at 9:15 AM the morning of surgery. Drink in one sitting. Do not sip.  This drink was given to you during your hospital  pre-op appointment visit. Nothing else to drink after completing the  Pre-Surgery G2.          If you have questions, please contact  your surgeons office.   FOLLOW BOWEL PREP AND ANY ADDITIONAL PRE OP INSTRUCTIONS YOU RECEIVED FROM YOUR SURGEON'S OFFICE!!!     Oral Hygiene is also important to reduce your risk of infection.                                    Remember - BRUSH YOUR TEETH THE MORNING OF SURGERY WITH YOUR REGULAR TOOTHPASTE  DENTURES WILL BE REMOVED PRIOR TO SURGERY PLEASE DO NOT APPLY Poly grip  OR ADHESIVES!!!   Do NOT smoke after Midnight   Stop all vitamins and herbal supplements 7 days before surgery.   Take these medicines the morning of surgery with A SIP OF WATER : Inhalers, Alprazolam , Gabapentin , Levothyroxine , Pantoprazole , Norco  DO NOT TAKE ANY ORAL DIABETIC MEDICATIONS DAY OF YOUR SURGERY  How to Manage Your Diabetes Before and After Surgery  Why is it important to control my blood sugar before and after surgery? Improving blood sugar levels before and after surgery helps healing and can limit problems. A way of improving blood sugar control is eating a healthy diet by:  Eating less sugar and carbohydrates  Increasing activity/exercise  Talking with your doctor about reaching your blood sugar goals High blood sugars (greater than 180 mg/dL) can raise your risk of infections and slow your recovery, so you will need to focus on controlling your diabetes during the weeks before surgery. Make sure that the doctor who takes care of your diabetes knows about your planned surgery including the date and location.  How do I manage my blood sugar before surgery? Check your blood sugar at least 4 times a day, starting 2 days before surgery, to make sure that the level is not too high or low. Check your blood sugar the morning of your surgery when you wake up and every 2 hours until you get to the Short Stay unit. If your blood sugar is less than 70 mg/dL, you will need to treat for low blood sugar: Do not take insulin . Treat a low blood sugar (less than 70 mg/dL) with  cup of clear juice  (cranberry or apple), 4 glucose tablets, OR glucose gel. Recheck blood sugar in 15 minutes after treatment (to make sure it is greater than 70 mg/dL). If your blood sugar is not greater than 70 mg/dL on recheck, call [phone] for further instructions. Report your blood sugar to the short stay nurse when you get to Short Stay.  If you are admitted to the hospital after surgery: Your blood sugar will be checked by the staff and you will probably be given insulin  after surgery (instead of oral diabetes medicines) to make sure you have good blood sugar levels. The goal for blood sugar control after surgery is 80-180 mg/dL.   WHAT DO I DO ABOUT MY DIABETES MEDICATION?  Do not take oral diabetes medicines (pills) the morning of surgery.  Do not take Mounjaro  after 05/30/24  DO NOT TAKE THE FOLLOWING 7 DAYS PRIOR TO SURGERY: Ozempic , Wegovy , Rybelsus  (Semaglutide ), Byetta (exenatide), Bydureon (exenatide ER), Victoza, Saxenda (liraglutide), or Trulicity (dulaglutide) Mounjaro  (Tirzepatide ) Adlyxin (Lixisenatide), Polyethylene Glycol Loxenatide.  Reviewed and Endorsed by [facility] Patient Education Committee, August 2015  Bring CPAP mask and tubing day of surgery.                              You may not have any metal on your body including hair pins, jewelry, and body piercing             Do not wear make-up, lotions, powders, perfumes, or deodorant  Do not wear nail polish including gel and S&S, artificial/acrylic nails, or any other type of covering on natural nails including finger and toenails. If you have artificial nails, gel coating, etc. that needs to be removed by a nail salon please have this removed prior to surgery or surgery may need to be canceled/ delayed if the surgeon/ anesthesia feels like  they are unable to be safely monitored.   Do not shave  48 hours prior to surgery.    Do not bring valuables to the hospital. [facility] IS NOT             RESPONSIBLE   FOR  VALUABLES.   Contacts, glasses, dentures or bridgework may not be worn into surgery.   Bring small overnight bag day of surgery.   DO NOT BRING YOUR HOME MEDICATIONS TO THE HOSPITAL. PHARMACY WILL DISPENSE MEDICATIONS LISTED ON YOUR MEDICATION LIST TO YOU DURING YOUR ADMISSION IN THE HOSPITAL!              Please read over the following fact sheets you were given: IF YOU HAVE QUESTIONS ABOUT YOUR PRE-OP INSTRUCTIONS PLEASE CALL [phone].   If you received a COVID test during your pre-op visit  it is requested that you wear a mask when out in public, stay away from anyone that may not be feeling well and notify your surgeon if you develop symptoms. If you test positive for Covid or have been in contact with anyone that has tested positive in the last 10 days please notify you surgeon.      Pre-operative 4 CHG Bath Instructions  DYNA-Hex 4 Chlorhexidine  Gluconate 4% Solution Antiseptic 4 fl. oz   You can play a key role in reducing the risk of infection after surgery. Your skin needs to be as free of germs as possible. You can reduce the number of germs on your skin by washing with CHG (chlorhexidine  gluconate) soap before surgery. CHG is an antiseptic soap that kills germs and continues to kill germs even after washing.   DO NOT use if you have an allergy  to chlorhexidine /CHG or antibacterial soaps. If your skin becomes reddened or irritated, stop using the CHG and notify one of our RNs at   Please shower with the CHG soap starting 4 days before surgery using the following schedule:     Please keep in mind the following:  DO NOT shave, including legs and underarms, starting the day of your first shower.   You may shave your face at any point before/day of surgery.  Place clean sheets on your bed the day you start using CHG soap. Use a clean washcloth (not used since being washed) for each shower. DO NOT sleep with pets once you start using the CHG.  CHG Shower  Instructions:  If you choose to wash your hair and private area, wash first with your normal shampoo/soap.  After you use shampoo/soap, rinse your hair and body thoroughly to remove shampoo/soap residue.  Turn the water  OFF and apply about 3 tablespoons (45 ml) of CHG soap to a CLEAN washcloth.  Apply CHG soap ONLY FROM YOUR NECK DOWN TO YOUR TOES (washing for 3-5 minutes)  DO NOT use CHG soap on face, private areas, open wounds, or sores.  Pay special attention to the area where your surgery is being performed.  If you are having back surgery, having someone wash your back for you may be helpful. Wait 2 minutes after CHG soap is applied, then you may rinse off the CHG soap.  Pat dry with a clean towel  Put on clean clothes/pajamas   If you choose to wear lotion, please use ONLY the CHG-compatible lotions on the back of this paper.     Additional instructions for the day of surgery: DO NOT APPLY any lotions, deodorants, cologne, or perfumes.   Put on  clean/comfortable clothes.  Brush your teeth.  Ask your nurse before applying any prescription medications to the skin.   CHG Compatible Lotions   Aveeno Moisturizing lotion  Cetaphil Moisturizing Cream  Cetaphil Moisturizing Lotion  Clairol Herbal Essence Moisturizing Lotion, Dry Skin  Clairol Herbal Essence Moisturizing Lotion, Extra Dry Skin  Clairol Herbal Essence Moisturizing Lotion, Normal Skin  Curel Age Defying Therapeutic Moisturizing Lotion with Alpha Hydroxy  Curel Extreme Care Body Lotion  Curel Soothing Hands Moisturizing Hand Lotion  Curel Therapeutic Moisturizing Cream, Fragrance-Free  Curel Therapeutic Moisturizing Lotion, Fragrance-Free  Curel Therapeutic Moisturizing Lotion, Original Formula  Eucerin Daily Replenishing Lotion  Eucerin Dry Skin Therapy Plus Alpha Hydroxy Crme  Eucerin Dry Skin Therapy Plus Alpha Hydroxy Lotion  Eucerin Original Crme  Eucerin Original Lotion  Eucerin Plus Crme Eucerin Plus  Lotion  Eucerin TriLipid Replenishing Lotion  Keri Anti-Bacterial Hand Lotion  Keri Deep Conditioning Original Lotion Dry Skin Formula Softly Scented  Keri Deep Conditioning Original Lotion, Fragrance Free Sensitive Skin Formula  Keri Lotion Fast Absorbing Fragrance Free Sensitive Skin Formula  Keri Lotion Fast Absorbing Softly Scented Dry Skin Formula  Keri Original Lotion  Keri Skin Renewal Lotion Keri Silky Smooth Lotion  Keri Silky Smooth Sensitive Skin Lotion  Nivea Body Creamy Conditioning Oil  Nivea Body Extra Enriched Lotion  Nivea Body Original Lotion  Nivea Body Sheer Moisturizing Lotion Nivea Crme  Nivea Skin Firming Lotion  NutraDerm 30 Skin Lotion  NutraDerm Skin Lotion  NutraDerm Therapeutic Skin Cream  NutraDerm Therapeutic Skin Lotion  ProShield Protective Hand Cream  Provon moisturizing lotion Incentive Spirometer  An incentive spirometer is a tool that can help keep your lungs clear and active. This tool measures how well you are filling your lungs with each breath. Taking long deep breaths may help reverse or decrease the chance of developing breathing (pulmonary) problems (especially infection) following: A long period of time when you are unable to move or be active. BEFORE THE PROCEDURE  If the spirometer includes an indicator to show your best effort, your nurse or respiratory therapist will set it to a desired goal. If possible, sit up straight or lean slightly forward. Try not to slouch. Hold the incentive spirometer in an upright position. INSTRUCTIONS FOR USE  Sit on the edge of your bed if possible, or sit up as far as you can in bed or on a chair. Hold the incentive spirometer in an upright position. Breathe out normally. Place the mouthpiece in your mouth and seal your lips tightly around it. Breathe in slowly and as deeply as possible, raising the piston or the ball toward the top of the column. Hold your breath for 3-5 seconds or for as long as  possible. Allow the piston or ball to fall to the bottom of the column. Remove the mouthpiece from your mouth and breathe out normally. Rest for a few seconds and repeat Steps 1 through 7 at least 10 times every 1-2 hours when you are awake. Take your time and take a few normal breaths between deep breaths. The spirometer may include an indicator to show your best effort. Use the indicator as a goal to work toward during each repetition. After each set of 10 deep breaths, practice coughing to be sure your lungs are clear. If you have an incision (the cut made at the time of surgery), support your incision when coughing by placing a pillow or rolled up towels firmly against it. Once you are able to get out  of bed, walk around indoors and cough well. You may stop using the incentive spirometer when instructed by your caregiver.  RISKS AND COMPLICATIONS Take your time so you do not get dizzy or light-headed. If you are in pain, you may need to take or ask for pain medication before doing incentive spirometry. It is harder to take a deep breath if you are having pain. AFTER USE Rest and breathe slowly and easily. It can be helpful to keep track of a log of your progress. Your caregiver can provide you with a simple table to help with this. If you are using the spirometer at home, follow these instructions: SEEK MEDICAL CARE IF:  You are having difficultly using the spirometer. You have trouble using the spirometer as often as instructed. Your pain medication is not giving enough relief while using the spirometer. You develop fever of 100.5 F (38.1 C) or higher. SEEK IMMEDIATE MEDICAL CARE IF:  You cough up bloody sputum that had not been present before. You develop fever of 102 F (38.9 C) or greater. You develop worsening pain at or near the incision site. MAKE SURE YOU:  Understand these instructions. Will watch your condition. Will get help right away if you are not doing well or get  worse. Document Released: 09/14/2006 Document Revised: 07/27/2011 Document Reviewed: 11/15/2006 Exit[employer] Patient Information 2014 [employer], [place].   ________________________________________________________________________ WHAT IS A BLOOD TRANSFUSION? Blood Transfusion Information  A transfusion is the replacement of blood or some of its parts. Blood is made up of multiple cells which provide different functions. Red blood cells carry oxygen and are used for blood loss replacement. White blood cells fight against infection. Platelets control bleeding. Plasma helps clot blood. Other blood products are available for specialized needs, such as hemophilia or other clotting disorders. BEFORE THE TRANSFUSION  Who gives blood for transfusions?  Healthy volunteers who are fully evaluated to make sure their blood is safe. This is blood bank blood. Transfusion therapy is the safest it has ever been in the practice of medicine. Before blood is taken from a donor, a complete history is taken to make sure that person has no history of diseases nor engages in risky social behavior (examples are intravenous drug use or sexual activity with multiple partners). The donor's travel history is screened to minimize risk of transmitting infections, such as malaria. The donated blood is tested for signs of infectious diseases, such as HIV and hepatitis. The blood is then tested to be sure it is compatible with you in order to minimize the chance of a transfusion reaction. If you or a relative donates blood, this is often done in anticipation of surgery and is not appropriate for emergency situations. It takes many days to process the donated blood. RISKS AND COMPLICATIONS Although transfusion therapy is very safe and saves many lives, the main dangers of transfusion include:  Getting an infectious disease. Developing a transfusion reaction. This is an allergic reaction to something in the blood you were given. Every  precaution is taken to prevent this. The decision to have a blood transfusion has been considered carefully by your caregiver before blood is given. Blood is not given unless the benefits outweigh the risks. AFTER THE TRANSFUSION Right after receiving a blood transfusion, you will usually feel much better and more energetic. This is especially true if your red blood cells have gotten low (anemic). The transfusion raises the level of the red blood cells which carry oxygen, and this usually causes an  energy increase. The nurse administering the transfusion will monitor you carefully for complications. HOME CARE INSTRUCTIONS  No special instructions are needed after a transfusion. You may find your energy is better. Speak with your caregiver about any limitations on activity for underlying diseases you may have. SEEK MEDICAL CARE IF:  Your condition is not improving after your transfusion. You develop redness or irritation at the intravenous (IV) site. SEEK IMMEDIATE MEDICAL CARE IF:  Any of the following symptoms occur over the next 12 hours: Shaking chills. You have a temperature by mouth above 102 F (38.9 C), not controlled by medicine. Chest, back, or muscle pain. People around you feel you are not acting correctly or are confused. Shortness of breath or difficulty breathing. Dizziness and fainting. You get a rash or develop hives. You have a decrease in urine output. Your urine turns a dark color or changes to pink, red, or brown. Any of the following symptoms occur over the next 10 days: You have a temperature by mouth above 102 F (38.9 C), not controlled by medicine. Shortness of breath. Weakness after normal activity. The white part of the eye turns yellow (jaundice). You have a decrease in the amount of urine or are urinating less often. Your urine turns a dark color or changes to pink, red, or brown. Document Released: 05/01/2000 Document Revised: 07/27/2011 Document Reviewed:  12/19/2007 [facility] Patient Information 2014 [place], [place].  _______________________________________________________________________

## 2024-06-01 NOTE — Progress Notes (Addendum)
 Date of COVID positive in last 90 days:  PCP - Camie Doing, NP Cardiologist - Maude Emmer, MD LOV 05/22/19 no f/u needed  Chest x-ray - N/A EKG - 06/02/24 Epic/chart Stress Test - 05/30/19 Epic ECHO - 05/30/19 Epic Cardiac Cath - N/A Pacemaker/ICD device last checked:N/A Spinal Cord Stimulator:N/A  Bowel Prep - N/A  Sleep Study - yes CPAP - using seasonally   Fasting Blood Sugar - no checks at home Checks Blood Sugar   Last dose of GLP1 agonist-  Mounjaro , takes on Fridays GLP1 instructions:  Do not take after 05/30/24   Last dose of SGLT-2 inhibitors-  N/A SGLT-2 instructions:  Do not take after     Blood Thinner Instructions: N/A Last dose:   Time: Aspirin  Instructions: N/A Last Dose:  Activity level: Can go up a flight of stairs and perform activities of daily living without stopping and without symptoms of chest pain or shortness of breath.   Anesthesia review: CHF, DM2, DOE, COPD, TIA  Patient denies shortness of breath, fever, cough and chest pain at PAT appointment  Patient verbalized understanding of instructions that were given to them at the PAT appointment. Patient was also instructed that they will need to review over the PAT instructions again at home before surgery.

## 2024-06-02 ENCOUNTER — Other Ambulatory Visit: Payer: Self-pay

## 2024-06-02 ENCOUNTER — Encounter (HOSPITAL_COMMUNITY)
Admission: RE | Admit: 2024-06-02 | Discharge: 2024-06-02 | Disposition: A | Discharge status: Home / Self Care | Source: Ambulatory Visit | Attending: Orthopedic Surgery | Admitting: Orthopedic Surgery

## 2024-06-02 ENCOUNTER — Encounter (HOSPITAL_COMMUNITY): Payer: Self-pay

## 2024-06-02 VITALS — BP 121/71 | HR 72 | Temp 98.1°F | Resp 16 | Ht 64.0 in | Wt 206.0 lb

## 2024-06-02 DIAGNOSIS — K219 Gastro-esophageal reflux disease without esophagitis: Secondary | ICD-10-CM | POA: Insufficient documentation

## 2024-06-02 DIAGNOSIS — F419 Anxiety disorder, unspecified: Secondary | ICD-10-CM | POA: Insufficient documentation

## 2024-06-02 DIAGNOSIS — Z79890 Hormone replacement therapy (postmenopausal): Secondary | ICD-10-CM | POA: Diagnosis not present

## 2024-06-02 DIAGNOSIS — M1611 Unilateral primary osteoarthritis, right hip: Secondary | ICD-10-CM | POA: Diagnosis not present

## 2024-06-02 DIAGNOSIS — Z01818 Encounter for other preprocedural examination: Secondary | ICD-10-CM | POA: Diagnosis not present

## 2024-06-02 DIAGNOSIS — Z7985 Long-term (current) use of injectable non-insulin antidiabetic drugs: Secondary | ICD-10-CM | POA: Diagnosis not present

## 2024-06-02 DIAGNOSIS — E039 Hypothyroidism, unspecified: Secondary | ICD-10-CM | POA: Insufficient documentation

## 2024-06-02 DIAGNOSIS — E669 Obesity, unspecified: Secondary | ICD-10-CM | POA: Diagnosis not present

## 2024-06-02 DIAGNOSIS — F4310 Post-traumatic stress disorder, unspecified: Secondary | ICD-10-CM | POA: Insufficient documentation

## 2024-06-02 DIAGNOSIS — Z6835 Body mass index (BMI) 35.0-35.9, adult: Secondary | ICD-10-CM | POA: Insufficient documentation

## 2024-06-02 DIAGNOSIS — E1169 Type 2 diabetes mellitus with other specified complication: Secondary | ICD-10-CM | POA: Insufficient documentation

## 2024-06-02 DIAGNOSIS — Z79891 Long term (current) use of opiate analgesic: Secondary | ICD-10-CM | POA: Insufficient documentation

## 2024-06-02 DIAGNOSIS — J4489 Other specified chronic obstructive pulmonary disease: Secondary | ICD-10-CM | POA: Diagnosis not present

## 2024-06-02 DIAGNOSIS — G8929 Other chronic pain: Secondary | ICD-10-CM | POA: Diagnosis not present

## 2024-06-02 DIAGNOSIS — Z8673 Personal history of transient ischemic attack (TIA), and cerebral infarction without residual deficits: Secondary | ICD-10-CM | POA: Insufficient documentation

## 2024-06-02 DIAGNOSIS — G4733 Obstructive sleep apnea (adult) (pediatric): Secondary | ICD-10-CM | POA: Insufficient documentation

## 2024-06-02 DIAGNOSIS — Z01810 Encounter for preprocedural cardiovascular examination: Secondary | ICD-10-CM | POA: Diagnosis present

## 2024-06-02 DIAGNOSIS — Z01812 Encounter for preprocedural laboratory examination: Secondary | ICD-10-CM | POA: Diagnosis present

## 2024-06-02 DIAGNOSIS — F17210 Nicotine dependence, cigarettes, uncomplicated: Secondary | ICD-10-CM | POA: Diagnosis not present

## 2024-06-02 HISTORY — DX: Nausea with vomiting, unspecified: R11.2

## 2024-06-02 LAB — BASIC METABOLIC PANEL WITH GFR
Anion gap: 11 (ref 5–15)
BUN: 12 mg/dL (ref 6–20)
CO2: 26 mmol/L (ref 22–32)
Calcium: 9.6 mg/dL (ref 8.9–10.3)
Chloride: 104 mmol/L (ref 98–111)
Creatinine, Ser: 0.67 mg/dL (ref 0.44–1.00)
GFR, Estimated: >60 mL/min
Glucose, Bld: 112 mg/dL — ABNORMAL HIGH (ref 70–99)
Potassium: 3.7 mmol/L (ref 3.5–5.1)
Sodium: 141 mmol/L (ref 135–145)

## 2024-06-02 LAB — CBC
HCT: 44.6 % (ref 36.0–46.0)
Hemoglobin: 14.4 g/dL (ref 12.0–15.0)
MCH: 29.4 pg (ref 26.0–34.0)
MCHC: 32.3 g/dL (ref 30.0–36.0)
MCV: 91.2 fL (ref 80.0–100.0)
Platelets: 317 K/uL (ref 150–400)
RBC: 4.89 MIL/uL (ref 3.87–5.11)
RDW: 13.4 % (ref 11.5–15.5)
WBC: 9.2 K/uL (ref 4.0–10.5)
nRBC: 0 % (ref 0.0–0.2)

## 2024-06-02 LAB — HEMOGLOBIN A1C
Hgb A1c MFr Bld: 6 % — ABNORMAL HIGH (ref 4.8–5.6)
Mean Plasma Glucose: 125.5 mg/dL

## 2024-06-02 LAB — GLUCOSE, CAPILLARY: Glucose-Capillary: 130 mg/dL — ABNORMAL HIGH (ref 70–99)

## 2024-06-05 ENCOUNTER — Encounter (HOSPITAL_COMMUNITY): Payer: Self-pay

## 2024-06-05 LAB — SURGICAL PCR SCREEN
MRSA, PCR: POSITIVE — AB
Staphylococcus aureus: POSITIVE — AB

## 2024-06-05 NOTE — Progress Notes (Signed)
 STAPH+, MRSA+ results routed to dr. Melodi

## 2024-06-05 NOTE — Anesthesia Preprocedure Evaluation (Signed)
"                                    Anesthesia Evaluation  Patient identified by MRN, date of birth, ID band Patient awake    Reviewed: Allergy  & Precautions, NPO status , Patient's Chart, lab work & pertinent test results  History of Anesthesia Complications (+) PONV  Airway Mallampati: I  TM Distance: >3 FB Neck ROM: Full    Dental  (+) Dental Advisory Given   Pulmonary sleep apnea and Continuous Positive Airway Pressure Ventilation , COPD,  COPD inhaler, Current Smoker and Patient abstained from smoking.   breath sounds clear to auscultation       Cardiovascular (-) angina  Rhythm:Regular Rate:Normal  '21 ECHO: EF 60 to 65%.  1. The LV has normal function. There is no LVH.   2. The left ventricle has no regional wall motion abnormalities.   3. Global right ventricle has normal systolic function.The right ventricular size is normal.    4. Left atrial size was normal.   5. Right atrial size was normal.   6. The mitral valve is normal in structure. Trivial mitral valve regurgitation. No evidence of mitral stenosis.   7. The tricuspid valve is normal in structure.   8. The aortic valve is tricuspid. Aortic valve regurgitation is not visualized. No evidence of aortic valve sclerosis or stenosis.   9. The pulmonic valve was not well visualized. Pulmonic valve regurgitation is not visualized.  10. Normal pulmonary artery systolic pressure.   '21 Stress: There was no ST segment deviation noted during stress. The study is normal. There are no perfusion defects consistent with prior infarct or current ischemia. This is a low risk study.     Neuro/Psych  Headaches PSYCHIATRIC DISORDERS (PTSD) Anxiety Depression    TIA   GI/Hepatic Neg liver ROS,GERD  Medicated and Controlled,,  Endo/Other  diabetesHypothyroidism  Mounjaro : 05/25/2024 last dose BMI 35  Renal/GU negative Renal ROS     Musculoskeletal   Abdominal   Peds  Hematology Hb 14.4, plt 317k    Anesthesia Other Findings   Reproductive/Obstetrics                              Anesthesia Physical Anesthesia Plan  ASA: 3  Anesthesia Plan: Spinal   Post-op Pain Management: Tylenol  PO (pre-op)*   Induction:   PONV Risk Score and Plan: 2 and Treatment may vary due to age or medical condition  Airway Management Planned: Natural Airway and Simple Face Mask  Additional Equipment: None  Intra-op Plan:   Post-operative Plan:   Informed Consent: I have reviewed the patients History and Physical, chart, labs and discussed the procedure including the risks, benefits and alternatives for the proposed anesthesia with the patient or authorized representative who has indicated his/her understanding and acceptance.     Dental advisory given  Plan Discussed with: CRNA and Surgeon  Anesthesia Plan Comments: (See PAT note from 1/16)         Anesthesia Quick Evaluation  "

## 2024-06-05 NOTE — Progress Notes (Signed)
 " Case: [id number] Date/Time: 06/07/24 1200   Procedure: ARTHROPLASTY, HIP, TOTAL, ANTERIOR APPROACH (Right: Hip)   Anesthesia type: Choice   Pre-op diagnosis: Right hip osteoarthritis   Location: [facility] ROOM 09 / [facility]   Surgeons: Melodi Lerner, MD        DISCUSSION: Denise Ray is a 54 yo female with PMH of current smoking, asthma, COPD, TIA (remotely), OSA on CPAP, migraines, GERD, type 2 diabetes, hypothyroid, arthritis, anxiety, PTSD, obesity (BMI 35), chronic pain narcotic dependence  Prior complications from anesthesia include PONV, urinary retention  Patient evaluated by cardiology in the past 2021 for cardiovascular risk factors and shortness of breath.  She underwent stress testing and an echo which were normal  Patient with heavy tobacco use, history of COPD and asthma.  Also with history of respiratory failure.  She is on inhalers.  Last dose Mounjaro : 1/13   VS: BP 121/71   Pulse 72   Temp 36.7 C (Oral)   Resp 16   Ht 5' 4 (1.626 m)   Wt 93.4 kg   SpO2 96%   BMI 35.36 kg/m   PROVIDERS: Early, Camie BRAVO, NP   LABS: Labs reviewed: Acceptable for surgery. (all labs ordered are listed, but only abnormal results are displayed)  Labs Reviewed  SURGICAL PCR SCREEN - Abnormal; Notable for the following components:      Result Value   MRSA, PCR POSITIVE (*)    Staphylococcus aureus POSITIVE (*)    All other components within normal limits  HEMOGLOBIN A1C - Abnormal; Notable for the following components:   Hgb A1c MFr Bld 6.0 (*)    All other components within normal limits  BASIC METABOLIC PANEL WITH GFR - Abnormal; Notable for the following components:   Glucose, Bld 112 (*)    All other components within normal limits  GLUCOSE, CAPILLARY - Abnormal; Notable for the following components:   Glucose-Capillary 130 (*)    All other components within normal limits  CBC  TYPE AND SCREEN    EKG 06/02/2024:  Normal sinus rhythm Low voltage QRS  Echo  05/30/2019:  IMPRESSIONS    1. Left ventricular ejection fraction, by visual estimation, is 60 to 65%. The left ventricle has normal function. There is no left ventricular hypertrophy.  2. The left ventricle has no regional wall motion abnormalities.  3. Global right ventricle has normal systolic function.The right ventricular size is normal. No increase in right ventricular wall thickness.  4. Left atrial size was normal.  5. Right atrial size was normal.  6. The mitral valve is normal in structure. Trivial mitral valve regurgitation. No evidence of mitral stenosis.  7. The tricuspid valve is normal in structure.  8. The aortic valve is tricuspid. Aortic valve regurgitation is not visualized. No evidence of aortic valve sclerosis or stenosis.  9. The pulmonic valve was not well visualized. Pulmonic valve regurgitation is not visualized. 10. Normal pulmonary artery systolic pressure. 11. The inferior vena cava is normal in size with greater than 50% respiratory variability, suggesting right atrial pressure of 3 mmHg.  Stress test 05/30/2019:  Narrative & Impression There was no ST segment deviation noted during stress. The study is normal. There are no perfusion defects consistent with prior infarct or current ischemia. This is a low risk study. The left ventricular ejection fraction is hyperdynamic (>65%).   Past Medical History:  Diagnosis Date   Chronic congestive heart failure, unspecified heart failure type (HCC) 07/07/2022   per pcp- no per  pt, no cards   Chronic pain    followed by pain clinic   Complication of anesthesia    post op urinary retention   COPD with asthma (HCC)    followed by pcp (previous seen by [facility]) hx exacerbation's  --- ( 10-01-2022 last asthma exacerbation ED visit 03-10-2022 in epic brought by EMS, ARF w/ hypoxmia Bipap,  left AMA last used rescue inhaler a week ago)   DDD (degenerative disc disease), lumbosacral    DOE (dyspnea on  exertion)    per pt going flight of stairs but normal daily activites no sob   Family history of adverse reaction to anesthesia    mother -- ponv ;  per pt in 04/ 2021 father had cardiac arrest and Atrial fib intraoperative for circumcision (this was in [place] ) put on venitator for two days then had nuclear stress test per pt showed disease but no cath done, still  has atrial fib;  she also stated prior to this he known cardiac disease with no intervention   GAD (generalized anxiety disorder)    GERD (gastroesophageal reflux disease)    Hemorrhoids    History of acute respiratory failure    06/ 2015  and 12/ 2016  CAP and Asthma exacerbation--  vented both times (ARDS)   History of anal dysplasia    AIN 2/ 3    s/p  surgical excision's    (07/ 2021   s/p excision anal polyp ,  SCCIS)   History of cervical dysplasia    CIN 2 in 2000/   History of chest pain    per pt had cardiology evaluation for chest pain (but pt stated has not had any chest pain or cardiac symtpoms since) referred by pcp  w/ dr delford note in epic 05-22-2019;  pt had nuclear stress test the showed normal perfusion no ischemia and ef >65% ,  also had echo same day showed normal w/ ef 65%   History of condyloma acuminatum    anal condyloma   s/p excision's   History of pneumonia    hx Required ventilatory support   History of respiratory failure 10/31/2013   History of TIA (transient ischemic attack)    Caused by medication reaction with combination of wellbutrin and estrogen   HSV-2 infection    Hypothyroidism    followed by pcp   MDD (major depressive disorder)    Migraines    improved per pt   OA (osteoarthritis)    bilateral hips w/ pain   OSA on CPAP    10-05-2022 pt stated usually uses nightly however not every night due to seasonall allergies   Peripheral neuropathy    PONV (postoperative nausea and vomiting)    PTSD (post-traumatic stress disorder)    TIA (transient ischemic attack)    Type 2  diabetes mellitus (HCC)    followed by pcp  (10-05-2022 pt stated does not check blood sugar)   Wears glasses     Past Surgical History:  Procedure Laterality Date   bladder lesion     removal during hysterectomy   CESAREAN SECTION  2001   CO2 LASER APPLICATION N/A 10/11/2014   Procedure: CO2 LASER APPLICATION;  Surgeon: Bernarda Ned, MD;  Location: [facility];  Service: General;  Laterality: N/A;   CONDYLOMA EXCISION/FULGURATION N/A 10/14/2022   Procedure: EXCISION OF ANAL CONDYLOMA;  Surgeon: Ned Bernarda, MD;  Location: [facility];  Service: General;  Laterality: N/A;   CONIZATION  OF CERVIX  2000   DILATION AND CURETTAGE OF UTERUS     EVALUATION UNDER ANESTHESIA WITH HEMORRHOIDECTOMY N/A 06/15/2014   Procedure: EXAM UNDER ANESTHESIA WITH HEMORRHOIDECTOMY;  Surgeon: Gordy Pina, MD;  Location: [facility];  Service: General;  Laterality: N/A;   EXCISION OF SKIN TAG N/A 06/15/2014   Procedure: EXCISION OF SKIN TAGS;  Surgeon: Gordy Pina, MD;  Location: [facility];  Service: General;  Laterality: N/A;   HIGH RESOLUTION ANOSCOPY N/A 10/11/2014   Procedure: HIGH RESOLUTION ANOSCOPY WITH BIOPSY;  Surgeon: Bernarda Ned, MD;  Location: [facility];  Service: General;  Laterality: N/A;   HYSTEROSCOPY WITH D & C  09/12/2007  &  2005   LASER ABLATION CONDOLAMATA N/A 11/20/2022   Procedure: LASER ABLATION;  Surgeon: Ned Bernarda, MD;  Location: [facility];  Service: General;  Laterality: N/A;  \   MASS EXCISION N/A 12/07/2019   Procedure: EXCISION OF ANAL POLYP AND PERIANAL MOLE;  Surgeon: Ned Bernarda, MD;  Location: [facility];  Service: General;  Laterality: N/A;   RECTAL EXAM UNDER ANESTHESIA N/A 10/11/2014   Procedure: ANAL EXAM UNDER ANESTHESIA;  Surgeon: Bernarda Ned, MD;  Location: [facility] [place];  Service: General;  Laterality: N/A;   RECTAL EXAM UNDER ANESTHESIA  N/A 12/18/2015   Procedure: ANAL EXAM UNDER ANESTHESIA  EXCISION ANAL MARGIN LESION;  Surgeon: Bernarda Ned, MD;  Location: [facility];  Service: General;  Laterality: N/A;   RECTAL EXAM UNDER ANESTHESIA N/A 12/07/2019   Procedure: ANAL EXAM UNDER ANESTHESIA;  Surgeon: Ned Bernarda, MD;  Location: [facility] [place];  Service: General;  Laterality: N/A;   ROBOTIC ASSISTED TOTAL HYSTERECTOMY N/A 10/11/2012   Procedure: ROBOTIC ASSISTED TOTAL HYSTERECTOMY;  Surgeon: Ronal Elvie Pinal, MD;  Location[facility];  Service: Gynecology;  Laterality: N/A;   SALPINGOOPHORECTOMY Bilateral 10/11/2012   Procedure: SALPINGO OOPHORECTOMY;  Surgeon: Ronal Elvie Pinal, MD;  Location: [facility];  Service: Gynecology;  Laterality: Bilateral;   TRANSTHORACIC ECHOCARDIOGRAM  05/07/2015   lvsf vigorous, ef 70-75%/  trivial MR and TR   VIDEO ASSISTED THORACOSCOPY (VATS)/THOROCOTOMY Right 11/04/2001   w/ Resection duplication esophageal cyst    MEDICATIONS:  albuterol  (PROVENTIL  HFA;VENTOLIN  HFA) 108 (90 BASE) MCG/ACT inhaler   albuterol  (PROVENTIL ) (2.5 MG/3ML) 0.083% nebulizer solution   ALPRAZolam  (XANAX ) 1 MG tablet   famotidine  (PEPCID ) 20 MG tablet   gabapentin  (NEURONTIN ) 100 MG capsule   HYDROcodone -acetaminophen  (NORCO) 10-325 MG tablet   levothyroxine  (SYNTHROID ) 50 MCG tablet   nystatin  cream (MYCOSTATIN )   nystatin  powder   pantoprazole  (PROTONIX ) 40 MG tablet   rosuvastatin  (CRESTOR ) 20 MG tablet   tirzepatide  (MOUNJARO ) 10 MG/0.5ML Pen   varenicline  (CHANTIX ) 1 MG tablet   Vitamin D , Ergocalciferol , (DRISDOL ) 1.25 MG (50000 UNIT) CAPS capsule   No current facility-administered medications for this encounter.   Burnard CHRISTELLA Odis DEVONNA [facility] Short Stay/Anesthesiology [facility] Phone [phone] 06/05/2024 11:40 AM        "

## 2024-06-06 ENCOUNTER — Other Ambulatory Visit: Payer: Self-pay | Admitting: Nurse Practitioner

## 2024-06-06 ENCOUNTER — Other Ambulatory Visit (HOSPITAL_COMMUNITY): Payer: Self-pay

## 2024-06-06 ENCOUNTER — Other Ambulatory Visit: Payer: Self-pay

## 2024-06-06 DIAGNOSIS — G893 Neoplasm related pain (acute) (chronic): Secondary | ICD-10-CM

## 2024-06-06 DIAGNOSIS — E1169 Type 2 diabetes mellitus with other specified complication: Secondary | ICD-10-CM

## 2024-06-06 DIAGNOSIS — G4733 Obstructive sleep apnea (adult) (pediatric): Secondary | ICD-10-CM

## 2024-06-06 DIAGNOSIS — F411 Generalized anxiety disorder: Secondary | ICD-10-CM

## 2024-06-06 DIAGNOSIS — F4310 Post-traumatic stress disorder, unspecified: Secondary | ICD-10-CM

## 2024-06-06 NOTE — Telephone Encounter (Signed)
 Last Appt.. 03/03/24.

## 2024-06-07 ENCOUNTER — Ambulatory Visit (HOSPITAL_COMMUNITY)

## 2024-06-07 ENCOUNTER — Encounter (HOSPITAL_COMMUNITY): Payer: Self-pay | Admitting: Orthopedic Surgery

## 2024-06-07 ENCOUNTER — Encounter (HOSPITAL_COMMUNITY): Admission: RE | Disposition: A | Payer: Self-pay | Source: Home / Self Care | Attending: Orthopedic Surgery

## 2024-06-07 ENCOUNTER — Encounter (HOSPITAL_COMMUNITY): Payer: Self-pay | Admitting: Medical

## 2024-06-07 ENCOUNTER — Ambulatory Visit (HOSPITAL_COMMUNITY): Payer: Self-pay | Admitting: Certified Registered"

## 2024-06-07 ENCOUNTER — Other Ambulatory Visit (HOSPITAL_COMMUNITY): Payer: Self-pay

## 2024-06-07 ENCOUNTER — Other Ambulatory Visit: Payer: Self-pay

## 2024-06-07 ENCOUNTER — Observation Stay (HOSPITAL_COMMUNITY)

## 2024-06-07 ENCOUNTER — Observation Stay (HOSPITAL_COMMUNITY)
Admission: RE | Admit: 2024-06-07 | Discharge: 2024-06-08 | Disposition: A | Discharge status: Home / Self Care | Attending: Orthopedic Surgery | Admitting: Orthopedic Surgery

## 2024-06-07 DIAGNOSIS — J4489 Other specified chronic obstructive pulmonary disease: Secondary | ICD-10-CM | POA: Diagnosis not present

## 2024-06-07 DIAGNOSIS — J449 Chronic obstructive pulmonary disease, unspecified: Secondary | ICD-10-CM | POA: Diagnosis not present

## 2024-06-07 DIAGNOSIS — Z7985 Long-term (current) use of injectable non-insulin antidiabetic drugs: Secondary | ICD-10-CM | POA: Diagnosis not present

## 2024-06-07 DIAGNOSIS — E039 Hypothyroidism, unspecified: Secondary | ICD-10-CM | POA: Diagnosis not present

## 2024-06-07 DIAGNOSIS — Z8673 Personal history of transient ischemic attack (TIA), and cerebral infarction without residual deficits: Secondary | ICD-10-CM | POA: Insufficient documentation

## 2024-06-07 DIAGNOSIS — I2510 Atherosclerotic heart disease of native coronary artery without angina pectoris: Secondary | ICD-10-CM | POA: Diagnosis not present

## 2024-06-07 DIAGNOSIS — F17210 Nicotine dependence, cigarettes, uncomplicated: Secondary | ICD-10-CM | POA: Insufficient documentation

## 2024-06-07 DIAGNOSIS — E119 Type 2 diabetes mellitus without complications: Secondary | ICD-10-CM | POA: Diagnosis not present

## 2024-06-07 DIAGNOSIS — I509 Heart failure, unspecified: Secondary | ICD-10-CM | POA: Diagnosis not present

## 2024-06-07 DIAGNOSIS — M169 Osteoarthritis of hip, unspecified: Principal | ICD-10-CM | POA: Diagnosis present

## 2024-06-07 DIAGNOSIS — E1169 Type 2 diabetes mellitus with other specified complication: Secondary | ICD-10-CM

## 2024-06-07 DIAGNOSIS — M1611 Unilateral primary osteoarthritis, right hip: Principal | ICD-10-CM | POA: Insufficient documentation

## 2024-06-07 DIAGNOSIS — G4730 Sleep apnea, unspecified: Secondary | ICD-10-CM | POA: Diagnosis not present

## 2024-06-07 DIAGNOSIS — M1612 Unilateral primary osteoarthritis, left hip: Secondary | ICD-10-CM | POA: Diagnosis present

## 2024-06-07 DIAGNOSIS — M25551 Pain in right hip: Secondary | ICD-10-CM | POA: Diagnosis present

## 2024-06-07 HISTORY — PX: TOTAL HIP ARTHROPLASTY: SHX124

## 2024-06-07 LAB — ABO/RH: ABO/RH(D): O NEG

## 2024-06-07 LAB — TYPE AND SCREEN
ABO/RH(D): O NEG
Antibody Screen: NEGATIVE
Weak D: POSITIVE

## 2024-06-07 LAB — GLUCOSE, CAPILLARY: Glucose-Capillary: 120 mg/dL — ABNORMAL HIGH (ref 70–99)

## 2024-06-07 MED ORDER — BUPIVACAINE-EPINEPHRINE (PF) 0.25% -1:200000 IJ SOLN
INTRAMUSCULAR | Status: AC
  Filled 2024-06-07: qty 30

## 2024-06-07 MED ORDER — BISACODYL 10 MG RE SUPP: 10.0000 mg | Freq: Every day | RECTAL | Status: DC | PRN

## 2024-06-07 MED ORDER — MIDAZOLAM HCL 2 MG/2ML IJ SOLN
INTRAMUSCULAR | Status: AC
  Filled 2024-06-07: qty 2

## 2024-06-07 MED ORDER — OXYCODONE HCL 5 MG PO TABS: 5.0000 mg | ORAL_TABLET | Freq: Once | ORAL | Status: DC | PRN

## 2024-06-07 MED ORDER — ONDANSETRON HCL 4 MG/2ML IJ SOLN
INTRAMUSCULAR | Status: DC | PRN
  Administered 2024-06-07: 4 mg via INTRAVENOUS

## 2024-06-07 MED ORDER — 0.9 % SODIUM CHLORIDE (POUR BTL) OPTIME
TOPICAL | Status: DC | PRN
  Administered 2024-06-07: 1000 mL

## 2024-06-07 MED ORDER — ALPRAZOLAM 1 MG PO TABS
1.0000 mg | ORAL_TABLET | Freq: Two times a day (BID) | ORAL | 2 refills | Status: AC | PRN
  Filled 2024-06-07: qty 60, 30d supply, fill #0

## 2024-06-07 MED ORDER — MIDAZOLAM HCL (PF) 2 MG/2ML IJ SOLN
0.5000 mg | Freq: Once | INTRAMUSCULAR | Status: AC | PRN
  Administered 2024-06-07: 0.5 mg via INTRAVENOUS

## 2024-06-07 MED ORDER — DOCUSATE SODIUM 100 MG PO CAPS
100.0000 mg | ORAL_CAPSULE | Freq: Two times a day (BID) | ORAL | Status: DC
  Administered 2024-06-07 – 2024-06-08 (×2): 100 mg via ORAL
  Filled 2024-06-07 (×2): qty 1

## 2024-06-07 MED ORDER — TRANEXAMIC ACID 1000 MG/10ML IV SOLN
INTRAVENOUS | Status: DC | PRN
  Administered 2024-06-07: 2000 mg via TOPICAL

## 2024-06-07 MED ORDER — METHOCARBAMOL 1000 MG/10ML IJ SOLN: 500.0000 mg | Freq: Four times a day (QID) | INTRAMUSCULAR | Status: DC | PRN

## 2024-06-07 MED ORDER — PHENOL 1.4 % MT LIQD: 1.0000 | OROMUCOSAL | Status: DC | PRN

## 2024-06-07 MED ORDER — MENTHOL 3 MG MT LOZG: 1.0000 | LOZENGE | OROMUCOSAL | Status: DC | PRN

## 2024-06-07 MED ORDER — HYDROMORPHONE HCL 1 MG/ML IJ SOLN
INTRAMUSCULAR | Status: AC
  Filled 2024-06-07: qty 2

## 2024-06-07 MED ORDER — METOCLOPRAMIDE HCL 5 MG/ML IJ SOLN: 5.0000 mg | Freq: Three times a day (TID) | INTRAMUSCULAR | Status: DC | PRN

## 2024-06-07 MED ORDER — POLYETHYLENE GLYCOL 3350 17 G PO PACK: 17.0000 g | PACK | Freq: Every day | ORAL | Status: DC | PRN

## 2024-06-07 MED ORDER — HYDROMORPHONE HCL 1 MG/ML IJ SOLN
0.2500 mg | INTRAMUSCULAR | Status: DC | PRN
  Administered 2024-06-07 (×4): 0.5 mg via INTRAVENOUS

## 2024-06-07 MED ORDER — ALBUTEROL SULFATE (2.5 MG/3ML) 0.083% IN NEBU: 2.5000 mg | INHALATION_SOLUTION | Freq: Four times a day (QID) | RESPIRATORY_TRACT | Status: DC | PRN

## 2024-06-07 MED ORDER — INSULIN ASPART 100 UNIT/ML IJ SOLN: 0.0000 [IU] | INTRAMUSCULAR | Status: DC | PRN

## 2024-06-07 MED ORDER — PROPOFOL 1000 MG/100ML IV EMUL
INTRAVENOUS | Status: AC
  Filled 2024-06-07: qty 100

## 2024-06-07 MED ORDER — ONDANSETRON HCL 4 MG/2ML IJ SOLN
INTRAMUSCULAR | Status: AC
  Filled 2024-06-07: qty 2

## 2024-06-07 MED ORDER — ORAL CARE MOUTH RINSE: 15.0000 mL | OROMUCOSAL | Status: DC | PRN

## 2024-06-07 MED ORDER — CHLORHEXIDINE GLUCONATE 0.12 % MT SOLN
15.0000 mL | Freq: Once | OROMUCOSAL | Status: AC
  Administered 2024-06-07: 15 mL via OROMUCOSAL

## 2024-06-07 MED ORDER — BUPIVACAINE-EPINEPHRINE (PF) 0.25% -1:200000 IJ SOLN
INTRAMUSCULAR | Status: DC | PRN
  Administered 2024-06-07: 30 mL

## 2024-06-07 MED ORDER — ONDANSETRON HCL 4 MG PO TABS: 4.0000 mg | ORAL_TABLET | Freq: Four times a day (QID) | ORAL | Status: DC | PRN

## 2024-06-07 MED ORDER — MIDAZOLAM HCL 5 MG/5ML IJ SOLN
INTRAMUSCULAR | Status: DC | PRN
  Administered 2024-06-07: 2 mg via INTRAVENOUS

## 2024-06-07 MED ORDER — FENTANYL CITRATE (PF) 100 MCG/2ML IJ SOLN
INTRAMUSCULAR | Status: DC | PRN
  Administered 2024-06-07 (×2): 50 ug via INTRAVENOUS

## 2024-06-07 MED ORDER — MEPIVACAINE HCL (PF) 2 % IJ SOLN
INTRAMUSCULAR | Status: DC | PRN
  Administered 2024-06-07: 60 mg via INTRATHECAL

## 2024-06-07 MED ORDER — OXYCODONE HCL 5 MG/5ML PO SOLN: 5.0000 mg | Freq: Once | ORAL | Status: DC | PRN

## 2024-06-07 MED ORDER — DEXMEDETOMIDINE HCL IN NACL 80 MCG/20ML IV SOLN
INTRAVENOUS | Status: AC
  Filled 2024-06-07: qty 20

## 2024-06-07 MED ORDER — POVIDONE-IODINE 10 % EX SWAB: 2.0000 | Freq: Once | CUTANEOUS | Status: DC

## 2024-06-07 MED ORDER — PHENYLEPHRINE HCL-NACL 20-0.9 MG/250ML-% IV SOLN
INTRAVENOUS | Status: DC | PRN
  Administered 2024-06-07: 35 ug/min via INTRAVENOUS

## 2024-06-07 MED ORDER — FENTANYL CITRATE (PF) 100 MCG/2ML IJ SOLN
INTRAMUSCULAR | Status: AC
  Filled 2024-06-07: qty 2

## 2024-06-07 MED ORDER — ALBUTEROL SULFATE HFA 108 (90 BASE) MCG/ACT IN AERS: 2.0000 | INHALATION_SPRAY | Freq: Four times a day (QID) | RESPIRATORY_TRACT | Status: DC | PRN

## 2024-06-07 MED ORDER — PANTOPRAZOLE SODIUM 40 MG PO TBEC
40.0000 mg | DELAYED_RELEASE_TABLET | Freq: Every day | ORAL | Status: DC
  Administered 2024-06-08: 40 mg via ORAL
  Filled 2024-06-07: qty 1

## 2024-06-07 MED ORDER — METOCLOPRAMIDE HCL 5 MG PO TABS: 5.0000 mg | ORAL_TABLET | Freq: Three times a day (TID) | ORAL | Status: DC | PRN

## 2024-06-07 MED ORDER — MUPIROCIN 2 % EX OINT
1.0000 | TOPICAL_OINTMENT | Freq: Two times a day (BID) | CUTANEOUS | 0 refills | Status: AC
  Filled 2024-06-07: qty 22, 11d supply, fill #0

## 2024-06-07 MED ORDER — ROSUVASTATIN CALCIUM 20 MG PO TABS: 20.0000 mg | ORAL_TABLET | Freq: Every day | ORAL | Status: DC

## 2024-06-07 MED ORDER — CEFAZOLIN SODIUM-DEXTROSE 2-4 GM/100ML-% IV SOLN
2.0000 g | INTRAVENOUS | Status: AC
  Administered 2024-06-07: 2 g via INTRAVENOUS
  Filled 2024-06-07: qty 100

## 2024-06-07 MED ORDER — ALPRAZOLAM 0.5 MG PO TABS: 1.0000 mg | ORAL_TABLET | Freq: Two times a day (BID) | ORAL | Status: DC | PRN

## 2024-06-07 MED ORDER — HYDROMORPHONE HCL 1 MG/ML IJ SOLN
0.5000 mg | INTRAMUSCULAR | Status: DC | PRN
  Administered 2024-06-07 – 2024-06-08 (×3): 1 mg via INTRAVENOUS
  Filled 2024-06-07 (×3): qty 1

## 2024-06-07 MED ORDER — ACETAMINOPHEN 10 MG/ML IV SOLN
1000.0000 mg | Freq: Four times a day (QID) | INTRAVENOUS | Status: DC
  Administered 2024-06-07: 1000 mg via INTRAVENOUS
  Filled 2024-06-07: qty 100

## 2024-06-07 MED ORDER — ORAL CARE MOUTH RINSE: 15.0000 mL | Freq: Once | OROMUCOSAL | Status: AC

## 2024-06-07 MED ORDER — OXYCODONE HCL 5 MG PO TABS
5.0000 mg | ORAL_TABLET | ORAL | Status: DC | PRN
  Administered 2024-06-07 – 2024-06-08 (×4): 10 mg via ORAL
  Filled 2024-06-07 (×4): qty 2

## 2024-06-07 MED ORDER — ALBUTEROL SULFATE (2.5 MG/3ML) 0.083% IN NEBU: 2.5000 mg | INHALATION_SOLUTION | Freq: Two times a day (BID) | RESPIRATORY_TRACT | Status: DC | PRN

## 2024-06-07 MED ORDER — MEPERIDINE HCL 25 MG/ML IJ SOLN: 6.2500 mg | INTRAMUSCULAR | Status: DC | PRN

## 2024-06-07 MED ORDER — MAGNESIUM CITRATE PO SOLN: 1.0000 | Freq: Once | ORAL | Status: DC | PRN

## 2024-06-07 MED ORDER — CEFAZOLIN SODIUM-DEXTROSE 2-4 GM/100ML-% IV SOLN
2.0000 g | Freq: Four times a day (QID) | INTRAVENOUS | Status: AC
  Administered 2024-06-07 (×2): 2 g via INTRAVENOUS
  Filled 2024-06-07 (×2): qty 100

## 2024-06-07 MED ORDER — CHLORHEXIDINE GLUCONATE 4 % EX SOLN
1.0000 | CUTANEOUS | 1 refills | Status: AC
  Filled 2024-06-07: qty 946, 30d supply, fill #0

## 2024-06-07 MED ORDER — MOUNJARO 10 MG/0.5ML ~~LOC~~ SOAJ
10.0000 mg | SUBCUTANEOUS | 0 refills | Status: AC
  Filled 2024-06-07: qty 2, 28d supply, fill #0

## 2024-06-07 MED ORDER — ACETAMINOPHEN 325 MG PO TABS
325.0000 mg | ORAL_TABLET | Freq: Four times a day (QID) | ORAL | Status: DC | PRN
  Administered 2024-06-08: 650 mg via ORAL
  Filled 2024-06-07: qty 2

## 2024-06-07 MED ORDER — DEXAMETHASONE SOD PHOSPHATE PF 10 MG/ML IJ SOLN
8.0000 mg | Freq: Once | INTRAMUSCULAR | Status: AC
  Administered 2024-06-07: 10 mg via INTRAVENOUS

## 2024-06-07 MED ORDER — DEXMEDETOMIDINE HCL IN NACL 80 MCG/20ML IV SOLN
INTRAVENOUS | Status: DC | PRN
  Administered 2024-06-07: 8 ug via INTRAVENOUS
  Administered 2024-06-07: 4 ug via INTRAVENOUS

## 2024-06-07 MED ORDER — DEXAMETHASONE SOD PHOSPHATE PF 10 MG/ML IJ SOLN
10.0000 mg | Freq: Once | INTRAMUSCULAR | Status: AC
  Administered 2024-06-08: 10 mg via INTRAVENOUS
  Filled 2024-06-07: qty 1

## 2024-06-07 MED ORDER — FAMOTIDINE 20 MG PO TABS: 20.0000 mg | ORAL_TABLET | Freq: Every evening | ORAL | Status: DC | PRN

## 2024-06-07 MED ORDER — METHOCARBAMOL 500 MG PO TABS
500.0000 mg | ORAL_TABLET | Freq: Four times a day (QID) | ORAL | Status: DC | PRN
  Administered 2024-06-07 – 2024-06-08 (×3): 500 mg via ORAL
  Filled 2024-06-07 (×3): qty 1

## 2024-06-07 MED ORDER — PROPOFOL 500 MG/50ML IV EMUL
INTRAVENOUS | Status: DC | PRN
  Administered 2024-06-07: 125 ug/kg/min via INTRAVENOUS

## 2024-06-07 MED ORDER — RIVAROXABAN 10 MG PO TABS
10.0000 mg | ORAL_TABLET | Freq: Every day | ORAL | Status: DC
  Administered 2024-06-08: 10 mg via ORAL
  Filled 2024-06-07: qty 1

## 2024-06-07 MED ORDER — TRANEXAMIC ACID 1000 MG/10ML IV SOLN
2000.0000 mg | Freq: Once | INTRAVENOUS | Status: AC
  Filled 2024-06-07: qty 20

## 2024-06-07 MED ORDER — DEXAMETHASONE SOD PHOSPHATE PF 10 MG/ML IJ SOLN
INTRAMUSCULAR | Status: AC
  Filled 2024-06-07: qty 1

## 2024-06-07 MED ORDER — HYDROCODONE-ACETAMINOPHEN 10-325 MG PO TABS
1.0000 | ORAL_TABLET | Freq: Four times a day (QID) | ORAL | 0 refills | Status: AC | PRN
  Filled 2024-06-07: qty 120, 30d supply, fill #0

## 2024-06-07 MED ORDER — LACTATED RINGERS IV SOLN: INTRAVENOUS | Status: DC

## 2024-06-07 MED ORDER — SODIUM CHLORIDE 0.9 % IV SOLN: INTRAVENOUS | Status: DC

## 2024-06-07 MED ORDER — VARENICLINE TARTRATE 0.5 MG PO TABS
1.0000 mg | ORAL_TABLET | Freq: Two times a day (BID) | ORAL | Status: DC
  Administered 2024-06-08: 1 mg via ORAL
  Filled 2024-06-07 (×2): qty 2

## 2024-06-07 MED ORDER — GABAPENTIN 300 MG PO CAPS
300.0000 mg | ORAL_CAPSULE | Freq: Four times a day (QID) | ORAL | Status: DC
  Administered 2024-06-07 – 2024-06-08 (×3): 300 mg via ORAL
  Filled 2024-06-07 (×3): qty 1

## 2024-06-07 MED ORDER — WATER FOR IRRIGATION, STERILE IR SOLN
Status: DC | PRN
  Administered 2024-06-07: 2000 mL

## 2024-06-07 MED ORDER — ONDANSETRON HCL 4 MG/2ML IJ SOLN: 4.0000 mg | Freq: Four times a day (QID) | INTRAMUSCULAR | Status: DC | PRN

## 2024-06-07 MED ORDER — HYDROCODONE-ACETAMINOPHEN 10-325 MG PO TABS
1.0000 | ORAL_TABLET | Freq: Four times a day (QID) | ORAL | Status: DC | PRN
  Administered 2024-06-07 – 2024-06-08 (×3): 1 via ORAL
  Filled 2024-06-07 (×4): qty 1

## 2024-06-07 MED ORDER — MEPIVACAINE HCL (PF) 2 % IJ SOLN
INTRAMUSCULAR | Status: AC
  Filled 2024-06-07: qty 20

## 2024-06-07 MED ORDER — LEVOTHYROXINE SODIUM 50 MCG PO TABS
50.0000 ug | ORAL_TABLET | Freq: Every day | ORAL | Status: DC
  Administered 2024-06-08: 50 ug via ORAL
  Filled 2024-06-07: qty 1

## 2024-06-07 NOTE — Anesthesia Postprocedure Evaluation (Signed)
"   Anesthesia Post Note  Patient: Francis LITTIE Fetters  Procedure(s) Performed: ARTHROPLASTY, HIP, TOTAL, ANTERIOR APPROACH (Right: Hip)     Patient location during evaluation: PACU Anesthesia Type: Spinal Level of consciousness: awake and alert, oriented and patient cooperative Pain management: pain level controlled Vital Signs Assessment: post-procedure vital signs reviewed and stable Respiratory status: spontaneous breathing, nonlabored ventilation and respiratory function stable Cardiovascular status: blood pressure returned to baseline and stable Postop Assessment: no apparent nausea or vomiting, spinal receding and patient able to bend at knees Anesthetic complications: no   No notable events documented.  Last Vitals:  Vitals:   06/07/24 1245 06/07/24 1300  BP: (!) 99/50 (!) 100/53  Pulse: 61 62  Resp: 17 18  Temp:  (!) 36.3 C  SpO2: 93% 95%    Last Pain:  Vitals:   06/07/24 1311  TempSrc:   PainSc: 7     LLE Motor Response: No movement due to regional block (06/07/24 1300)   RLE Motor Response: No movement due to regional block (06/07/24 1300)   L Sensory Level: L4-Anterior knee, lower leg (06/07/24 1300) R Sensory Level: L4-Anterior knee, lower leg (06/07/24 1300)  Rosalee Tolley,E. Syncere Eble      "

## 2024-06-07 NOTE — Transfer of Care (Signed)
 Immediate Anesthesia Transfer of Care Note  Patient: Denise Ray  Procedure(s) Performed: ARTHROPLASTY, HIP, TOTAL, ANTERIOR APPROACH (Right: Hip)  Patient Location: PACU  Anesthesia Type:Spinal  Level of Consciousness: awake, alert , and oriented  Airway & Oxygen Therapy: Patient Spontanous Breathing and Patient connected to face mask oxygen  Post-op Assessment: Report given to RN and Post -op Vital signs reviewed and stable  Post vital signs: Reviewed and stable  Last Vitals:  Vitals Value Taken Time  BP 105/49 06/07/24 12:33  Temp 35.7 C 06/07/24 12:33  Pulse 63 06/07/24 12:35  Resp 14 06/07/24 12:35  SpO2 100 % 06/07/24 12:35  Vitals shown include unfiled device data.  Last Pain:  Vitals:   06/07/24 0851  TempSrc: Oral  PainSc: 7          Complications: No notable events documented.

## 2024-06-07 NOTE — Evaluation (Addendum)
 Physical Therapy Evaluation Patient Details Name: Denise Ray MRN: [id number] DOB: 1971/05/09 Today's Date: 06/07/2024  History of Present Illness  Pt is 54 yo female admitted on 06/07/24 for R anterior THA.  Pt with hx including but not limited to arthritis, anxiety, peripheral polyneuropathy, CHF, CAD, GERD, DM, OSA, COPD, PTSD, DDD, migraines, OA  Clinical Impression  Pt is s/p THA resulting in the deficits listed below (see PT Problem List). At baseline , pt independent and working, ambulatory without AD.  She has assist at d/c, DME, and ramp to enter home.  Pt does report chronic pain and anticipating pain management issues so did not do same day d/c.  She rated pain at 6/10 pre therapy and up to 9/10 after walking 25' with RW and CGA.  Attempted to assist in repositioning with little relief, pt requesting further pain meds and RN notified.  Other than pain control, pt moved well with light min A for transfers and steady ambulation with RW.  Expected to progress well once pain controlled.  Pt will benefit from acute skilled PT to increase their independence and safety with mobility to facilitate discharge.          If plan is discharge home, recommend the following: A little help with walking and/or transfers;A little help with bathing/dressing/bathroom;Assistance with cooking/housework;Help with stairs or ramp for entrance   Can travel by private vehicle        Equipment Recommendations None recommended by PT  Recommendations for Other Services       Functional Status Assessment Patient has had a recent decline in their functional status and demonstrates the ability to make significant improvements in function in a reasonable and predictable amount of time.     Precautions / Restrictions Precautions Precautions: Fall Restrictions Weight Bearing Restrictions Per Provider Order: Yes RLE Weight Bearing Per Provider Order: Weight bearing as tolerated      Mobility  Bed  Mobility Overal bed mobility: Needs Assistance Bed Mobility: Supine to Sit     Supine to sit: Min assist          Transfers Overall transfer level: Needs assistance Equipment used: Rolling walker (2 wheels) Transfers: Sit to/from Stand Sit to Stand: Contact guard assist           General transfer comment: cues for hand placement    Ambulation/Gait Ambulation/Gait assistance: Contact guard assist Gait Distance (Feet): 25 Feet Assistive device: Rolling walker (2 wheels) Gait Pattern/deviations: Decreased stride length, Step-through pattern Gait velocity: decreased     General Gait Details: Tried step to R and step to L but reports no major change in pain with either.  CUes for using RW for pressure relief and small steps for pain control.  Stairs            Wheelchair Mobility     Tilt Bed    Modified Rankin (Stroke Patients Only)       Balance Overall balance assessment: Needs assistance Sitting-balance support: No upper extremity supported Sitting balance-Leahy Scale: Good     Standing balance support: Bilateral upper extremity supported, Reliant on assistive device for balance Standing balance-Leahy Scale: Poor Standing balance comment: steady with RW                             Pertinent Vitals/Pain Pain Assessment Pain Assessment: 0-10 Pain Score:  (6/10 pre, 9/10 post) Pain Location: R hip Pain Descriptors / Indicators: Discomfort Pain Intervention(s): Limited  activity within patient's tolerance, Monitored during session, Premedicated before session, Repositioned, Other (comment), Ice applied, Patient requesting pain meds-RN notified (Pain up to 9/10 after walking, helped to reposition multiple pillows and ice with little relief, pt feels need further meds notified RN)    Home Living Family/patient expects to be discharged to:: Private residence Living Arrangements: Children;Parent Available Help at Discharge: Family;Available  24 hours/day Type of Home: Mobile home Home Access: Ramped entrance       Home Layout: One level Home Equipment: Pharmacist, Hospital (2 wheels)      Prior Function Prior Level of Function : Independent/Modified Independent;Driving;Working/employed             Mobility Comments: Could ambulate in community without AD ADLs Comments: Independent with ADls and IADLs; works in respiratory therapy for [employer]     Extremity/Trunk Assessment   Upper Extremity Assessment Upper Extremity Assessment: Overall WFL for tasks assessed    Lower Extremity Assessment Lower Extremity Assessment: LLE deficits/detail;RLE deficits/detail RLE Deficits / Details: Expected post op changes; ROM WFL; MMT: ankle 5/5, knee ext 2/5 (limited by hip pain), hip 1/5 LLE Deficits / Details: ROM WFL; MMT 5/5    Cervical / Trunk Assessment Cervical / Trunk Assessment: Normal  Communication        Cognition Arousal: Alert Behavior During Therapy: WFL for tasks assessed/performed   PT - Cognitive impairments: No apparent impairments                       PT - Cognition Comments: Pt with increased pain but puts in good effort         Cueing       General Comments General comments (skin integrity, edema, etc.): VSS on RA, continuous pulse ox in place    Exercises     Assessment/Plan    PT Assessment Patient needs continued PT services  PT Problem List Decreased strength;Pain;Decreased activity tolerance;Decreased range of motion;Decreased balance;Decreased mobility;Decreased knowledge of use of DME       PT Treatment Interventions DME instruction;Therapeutic exercise;Gait training;Stair training;Functional mobility training;Therapeutic activities;Patient/family education;Balance training;Modalities    PT Goals (Current goals can be found in the Care Plan section)  Acute Rehab PT Goals Patient Stated Goal: return home PT Goal Formulation: With patient/family Time For Goal  Achievement: 06/21/24 Potential to Achieve Goals: Good    Frequency 7X/week     Co-evaluation               AM-PAC PT 6 Clicks Mobility  Outcome Measure Help needed turning from your back to your side while in a flat bed without using bedrails?: A Little Help needed moving from lying on your back to sitting on the side of a flat bed without using bedrails?: A Little Help needed moving to and from a bed to a chair (including a wheelchair)?: A Little Help needed standing up from a chair using your arms (e.g., wheelchair or bedside chair)?: A Little Help needed to walk in hospital room?: A Little Help needed climbing 3-5 steps with a railing? : A Little 6 Click Score: 18    End of Session Equipment Utilized During Treatment: Gait belt Activity Tolerance: Patient limited by pain Patient left: with chair alarm set;in chair;with call bell/phone within reach;with SCD's reapplied;with family/visitor present Nurse Communication: Mobility status;Patient requests pain meds PT Visit Diagnosis: Other abnormalities of gait and mobility (R26.89);Muscle weakness (generalized) (M62.81)    Time: 8398-8374 PT Time Calculation (min) (ACUTE ONLY): 24 min  Charges:   PT Evaluation $PT Eval Low Complexity: 1 Low PT Treatments $Gait Training: 8-22 mins PT General Charges $$ ACUTE PT VISIT: 1 Visit         Benjiman, PT Acute Rehab [facility] [phone]   Benjiman VEAR Mul[place] 06/07/2024, 4:41 PM

## 2024-06-07 NOTE — Op Note (Signed)
 "     OPERATIVE REPORT- TOTAL HIP ARTHROPLASTY   PREOPERATIVE DIAGNOSIS: Osteoarthritis of the Right hip.   POSTOPERATIVE DIAGNOSIS: Osteoarthritis of the Right  hip.   PROCEDURE: Right total hip arthroplasty, anterior approach.   SURGEON: Dempsey Moan, MD   ASSISTANT: Roxie Mess, PA-C  ANESTHESIA:  Spinal  ESTIMATED BLOOD LOSS:-350 mL    DRAINS: None  COMPLICATIONS: None   CONDITION: PACU - hemodynamically stable.   BRIEF CLINICAL NOTE: Denise Ray is a 54 y.o. female who has advanced end-  stage arthritis of their Right  hip with progressively worsening pain and  dysfunction.The patient has failed nonoperative management and presents for  total hip arthroplasty.   PROCEDURE IN DETAIL: After successful administration of spinal  anesthetic, the traction boots for the [facility] bed were placed on both  feet and the patient was placed onto the [facility] bed, boots placed into the leg  holders. The Right hip was then isolated from the perineum with plastic  drapes and prepped and draped in the usual sterile fashion. ASIS and  greater trochanter were marked and a oblique incision was made, starting  at about 1 cm lateral and 2 cm distal to the ASIS and coursing towards  the anterior cortex of the femur. The skin was cut with a 10 blade  through subcutaneous tissue to the level of the fascia overlying the  tensor fascia lata muscle. The fascia was then incised in line with the  incision at the junction of the anterior third and posterior 2/3rd. The  muscle was teased off the fascia and then the interval between the TFL  and the rectus was developed. The Hohmann retractor was then placed at  the top of the femoral neck over the capsule. The vessels overlying the  capsule were cauterized and the fat on top of the capsule was removed.  A Hohmann retractor was then placed anterior underneath the rectus  femoris to give exposure to the entire anterior capsule. A T-shaped   capsulotomy was performed. The edges were tagged and the femoral head  was identified.       Osteophytes are removed off the superior acetabulum.  The femoral neck was then cut in situ with an oscillating saw. Traction  was then applied to the left lower extremity utilizing the [facility]  traction. The femoral head was then removed. Retractors were placed  around the acetabulum and then circumferential removal of the labrum was  performed. Osteophytes were also removed. Reaming starts at 45 mm to  medialize and  Increased in 2 mm increments to 47 mm. We reamed in  approximately 40 degrees of abduction, 20 degrees anteversion. A 48 mm  pinnacle acetabular shell was then impacted in anatomic position under  fluoroscopic guidance with excellent purchase. We did not need to place  any additional dome screws. A 28 mm neutral + 4 Altrx liner was then  placed into the acetabular shell.       The femoral lift was then placed along the lateral aspect of the femur  just distal to the vastus ridge. The leg was  externally rotated and capsule  was stripped off the inferior aspect of the femoral neck down to the  level of the lesser trochanter, this was done with electrocautery. The femur was lifted after this was performed. The  leg was then placed in an extended and adducted position essentially delivering the femur. We also removed the capsule superiorly and the piriformis from the piriformis fossa  to gain excellent exposure of the  proximal femur. Rongeur was used to remove some cancellous bone to get  into the lateral portion of the proximal femur for placement of the  initial starter reamer. The starter broaches was placed  the starter broach  and was shown to go down the center of the canal. Broaching  with the Actis system was then performed starting at size 0  coursing  Up to size 4. A size 4 had excellent torsional and rotational  and axial stability. The trial standard offset neck was then placed   with a 28 + 1.5 trial head. The hip was then reduced. We confirmed that  the stem was in the canal both on AP and lateral x-rays. It also has excellent sizing. The hip was reduced with outstanding stability through full extension and full external rotation.. AP pelvis was taken and the leg lengths were measured and found to be equal. Hip was then dislocated again and the femoral head and neck removed. The  femoral broach was removed. Size 4 Actis stem with a standard offset  neck was then impacted into the femur following native anteversion. Has  excellent purchase in the canal. Excellent torsional and rotational and  axial stability. It is confirmed to be in the canal on AP and lateral  fluoroscopic views. The 28 + 1.5 ceramic head was placed and the hip  reduced with outstanding stability. Again AP pelvis was taken and it  confirmed that the leg lengths were equal. The wound was then copiously  irrigated with saline solution and the capsule reattached and repaired  with Ethibond suture. 30 ml of .25% Bupivicaine was  injected into the capsule and into the edge of the tensor fascia lata as well as subcutaneous tissue. The fascia overlying the tensor fascia lata was then closed with a running #1 V-Loc. Subcu was closed with interrupted 2-0 Vicryl and subcuticular running 4-0 Monocryl. Incision was cleaned  and dried. Steri-Strips and a bulky sterile dressing applied. The patient was awakened and transported to  recovery in stable condition.        Please note that a surgical assistant was a medical necessity for this procedure to perform it in a safe and expeditious manner. Assistant was necessary to provide appropriate retraction of vital neurovascular structures and to prevent femoral fracture and allow for anatomic placement of the prosthesis.  Dempsey Moan, M.D.    "

## 2024-06-07 NOTE — Progress Notes (Signed)
 Pt's visitor Timmy has her inhaler and hose/mask for cpap (in car).

## 2024-06-07 NOTE — Interval H&P Note (Signed)
 History and Physical Interval Note:  06/07/2024 9:49 AM  Denise Ray  has presented today for surgery, with the diagnosis of Right hip osteoarthritis.  The various methods of treatment have been discussed with the patient and family. After consideration of risks, benefits and other options for treatment, the patient has consented to  Procedures: ARTHROPLASTY, HIP, TOTAL, ANTERIOR APPROACH (Right) as a surgical intervention.  The patient's history has been reviewed, patient examined, no change in status, stable for surgery.  I have reviewed the patient's chart and labs.  Questions were answered to the patient's satisfaction.     Dempsey Jadi Deyarmin

## 2024-06-07 NOTE — Anesthesia Procedure Notes (Signed)
 Spinal  Patient location during procedure: OR End time: 06/07/2024 10:53 AM Reason for block: surgical anesthesia  Staffing Performed: resident/CRNA  Authorized by: Leonce Athens, MD   Performed by: Adamarys Shall, Clinical Cytogeneticist D, CRNA  Preanesthetic Checklist Completed: patient identified, IV checked, site marked, risks and benefits discussed, surgical consent, monitors and equipment checked, pre-op evaluation and timeout performed Spinal Block Patient position: sitting Prep: DuraPrep Patient monitoring: heart rate, continuous pulse ox and blood pressure Approach: midline Location: L3-4 Injection technique: single-shot Needle Needle type: Pencan  Needle gauge: 24 G Needle length: 9 cm Assessment Sensory level: T6 Events: CSF return

## 2024-06-07 NOTE — Discharge Instructions (Addendum)
 Dempsey Moan, MD Total Joint Specialist [facility] [address] [place], [place] [address] [phone]  ANTERIOR APPROACH TOTAL HIP REPLACEMENT POSTOPERATIVE DIRECTIONS     Hip Rehabilitation, Guidelines Following Surgery  The results of a hip operation are greatly improved after range of motion and muscle strengthening exercises. Follow all safety measures which are given to protect your hip. If any of these exercises cause increased pain or swelling in your joint, decrease the amount until you are comfortable again. Then slowly increase the exercises. Call your caregiver if you have problems or questions.   BLOOD CLOT PREVENTION Take a 10 mg Xarelto  once a day for three weeks following surgery. Then take an 81 mg Aspirin once a day for three weeks. Then discontinue Aspirin. You may resume your vitamins/supplements once you have discontinued the Xarelto . Do not take any NSAIDs (Advil, Aleve, Ibuprofen, Meloxicam, etc.) until you have discontinued the Xarelto .   HOME CARE INSTRUCTIONS  Remove items at home which could result in a fall. This includes throw rugs or furniture in walking pathways.  ICE to the affected hip as frequently as 20-30 minutes an hour and then as needed for pain and swelling. Continue to use ice on the hip for pain and swelling from surgery. You may notice swelling that will progress down to the foot and ankle. This is normal after surgery. Elevate the leg when you are not up walking on it.   Continue to use the breathing machine which will help keep your temperature down.  It is common for your temperature to cycle up and down following surgery, especially at night when you are not up moving around and exerting yourself.  The breathing machine keeps your lungs expanded and your temperature down.  DIET You may resume your previous home diet once your are discharged from the hospital.  DRESSING / WOUND CARE / SHOWERING You have an  adhesive waterproof bandage over the incision. Leave this in place until your first follow-up appointment. Once you remove this you will not need to place another bandage.  You may begin showering 3 days following surgery, but do not submerge the incision under water .  ACTIVITY For the first 3-5 days, it is important to rest and keep the operative leg elevated. You should, as a general rule, rest for 50 minutes and walk/stretch for 10 minutes per hour. After 5 days, you may slowly increase activity as tolerated.  Perform the exercises you were provided twice a day for about 15-20 minutes each session. Begin these 2 days following surgery. Walk with your walker as instructed. Use the walker until you are comfortable transitioning to a cane. Walk with the cane in the opposite hand of the operative leg. You may discontinue the cane once you are comfortable and walking steadily. Avoid periods of inactivity such as sitting longer than an hour when not asleep. This helps prevent blood clots.  Do not drive a car for 6 weeks or until released by your surgeon.  Do not drive while taking narcotics.  TED HOSE STOCKINGS Wear the elastic stockings on both legs for three weeks following surgery during the day. You may remove them at night while sleeping.  WEIGHT BEARING Weight bearing as tolerated with assist device (walker, cane, etc) as directed, use it as long as suggested by your surgeon or therapist, typically at least 4-6 weeks.  POSTOPERATIVE CONSTIPATION PROTOCOL Constipation - defined medically as fewer than three stools per week and severe constipation as less  than one stool per week.  One of the most common issues patients have following surgery is constipation.  Even if you have a regular bowel pattern at home, your normal regimen is likely to be disrupted due to multiple reasons following surgery.  Combination of anesthesia, postoperative narcotics, change in appetite and fluid intake all can  affect your bowels.  In order to avoid complications following surgery, here are some recommendations in order to help you during your recovery period.  Colace (docusate) - Pick up an over-the-counter form of Colace or another stool softener and take twice a day as long as you are requiring postoperative pain medications.  Take with a full glass of water  daily.  If you experience loose stools or diarrhea, hold the colace until you stool forms back up.  If your symptoms do not get better within 1 week or if they get worse, check with your doctor. Dulcolax (bisacodyl ) - Pick up over-the-counter and take as directed by the product packaging as needed to assist with the movement of your bowels.  Take with a full glass of water .  Use this product as needed if not relieved by Colace only.  MiraLax  (polyethylene glycol) - Pick up over-the-counter to have on hand.  MiraLax  is a solution that will increase the amount of water  in your bowels to assist with bowel movements.  Take as directed and can mix with a glass of water , juice, soda, coffee, or tea.  Take if you go more than two days without a movement.Do not use MiraLax  more than once per day. Call your doctor if you are still constipated or irregular after using this medication for 7 days in a row.  If you continue to have problems with postoperative constipation, please contact the office for further assistance and recommendations.  If you experience "the worst abdominal pain ever" or develop nausea or vomiting, please contact the office immediatly for further recommendations for treatment.  ITCHING  If you experience itching with your medications, try taking only a single pain pill, or even half a pain pill at a time.  You can also use Benadryl  over the counter for itching or also to help with sleep.   MEDICATIONS See your medication summary on the "After Visit Summary" that the nursing staff will review with you prior to discharge.  You may have some home  medications which will be placed on hold until you complete the course of blood thinner medication.  It is important for you to complete the blood thinner medication as prescribed by your surgeon.  Continue your approved medications as instructed at time of discharge.  PRECAUTIONS If you experience chest pain or shortness of breath - call 911 immediately for transfer to the hospital emergency department.  If you develop a fever greater that 101 F, purulent drainage from wound, increased redness or drainage from wound, foul odor from the wound/dressing, or calf pain - CONTACT YOUR SURGEON.                                                   FOLLOW-UP APPOINTMENTS Make sure you keep all of your appointments after your operation with your surgeon and caregivers. You should call the office at the above phone number and make an appointment for approximately two weeks after the date of your surgery or on the date  instructed by your surgeon outlined in the "After Visit Summary".  RANGE OF MOTION AND STRENGTHENING EXERCISES  These exercises are designed to help you keep full movement of your hip joint. Follow your caregiver's or physical therapist's instructions. Perform all exercises about fifteen times, three times per day or as directed. Exercise both hips, even if you have had only one joint replacement. These exercises can be done on a training (exercise) mat, on the floor, on a table or on a bed. Use whatever works the best and is most comfortable for you. Use music or television while you are exercising so that the exercises are a pleasant break in your day. This will make your life better with the exercises acting as a break in routine you can look forward to.  Lying on your back, slowly slide your foot toward your buttocks, raising your knee up off the floor. Then slowly slide your foot back down until your leg is straight again.  Lying on your back spread your legs as far apart as you can without causing  discomfort.  Lying on your side, raise your upper leg and foot straight up from the floor as far as is comfortable. Slowly lower the leg and repeat.  Lying on your back, tighten up the muscle in the front of your thigh (quadriceps muscles). You can do this by keeping your leg straight and trying to raise your heel off the floor. This helps strengthen the largest muscle supporting your knee.  Lying on your back, tighten up the muscles of your buttocks both with the legs straight and with the knee bent at a comfortable angle while keeping your heel on the floor.   POST-OPERATIVE OPIOID TAPER INSTRUCTIONS: It is important to wean off of your opioid medication as soon as possible. If you do not need pain medication after your surgery it is ok to stop day one. Opioids include: Codeine, Hydrocodone (Norco, Vicodin), Oxycodone (Percocet, oxycontin ) and hydromorphone  amongst others.  Long term and even short term use of opiods can cause: Increased pain response Dependence Constipation Depression Respiratory depression And more.  Withdrawal symptoms can include Flu like symptoms Nausea, vomiting And more Techniques to manage these symptoms Hydrate well Eat regular healthy meals Stay active Use relaxation techniques(deep breathing, meditating, yoga) Do Not substitute Alcohol to help with tapering If you have been on opioids for less than two weeks and do not have pain than it is ok to stop all together.  Plan to wean off of opioids This plan should start within one week post op of your joint replacement. Maintain the same interval or time between taking each dose and first decrease the dose.  Cut the total daily intake of opioids by one tablet each day Next start to increase the time between doses. The last dose that should be eliminated is the evening dose.   IF YOU ARE TRANSFERRED TO A SKILLED REHAB FACILITY If the patient is transferred to a skilled rehab facility following release from the  hospital, a list of the current medications will be sent to the facility for the patient to continue.  When discharged from the skilled rehab facility, please have the facility set up the patient's Home Health Physical Therapy prior to being released. Also, the skilled facility will be responsible for providing the patient with their medications at time of release from the facility to include their pain medication, the muscle relaxants, and their blood thinner medication. If the patient is still at the rehab facility at  time of the two week follow up appointment, the skilled rehab facility will also need to assist the patient in arranging follow up appointment in our office and any transportation needs.  MAKE SURE YOU:  Understand these instructions.  Get help right away if you are not doing well or get worse.    DENTAL ANTIBIOTICS:  In most cases prophylactic antibiotics for Dental procdeures after total joint surgery are not necessary.  Exceptions are as follows:  1. History of prior total joint infection  2. Severely immunocompromised (Organ Transplant, cancer chemotherapy, Rheumatoid biologic meds such as Humera)  3. Poorly controlled diabetes (A1C &gt; 8.0, blood glucose over 200)  If you have one of these conditions, contact your surgeon for an antibiotic prescription, prior to your dental procedure.    Pick up stool softner and laxative for home use following surgery while on pain medications. Do not submerge incision under water . Please use good hand washing techniques while changing dressing each day. May shower starting three days after surgery. Please use a clean towel to pat the incision dry following showers. Continue to use ice for pain and swelling after surgery. Do not use any lotions or creams on the incision until instructed by your surgeon.    Information on my medicine - XARELTO  (Rivaroxaban )    Why was Xarelto  prescribed for you? Xarelto  was prescribed  for you to reduce the risk of blood clots forming after orthopedic surgery. The medical term for these abnormal blood clots is venous thromboembolism (VTE).  What do you need to know about xarelto  ? Take your Xarelto  ONCE DAILY at the same time every day. You may take it either with or without food.  If you have difficulty swallowing the tablet whole, you may crush it and mix in applesauce just prior to taking your dose.  Take Xarelto  exactly as prescribed by your doctor and DO NOT stop taking Xarelto  without talking to the doctor who prescribed the medication.  Stopping without other VTE prevention medication to take the place of Xarelto  may increase your risk of developing a clot.  After discharge, you should have regular check-up appointments with your healthcare provider that is prescribing your Xarelto .    What do you do if you miss a dose? If you miss a dose, take it as soon as you remember on the same day then continue your regularly scheduled once daily regimen the next day. Do not take two doses of Xarelto  on the same day.   Important Safety Information A possible side effect of Xarelto  is bleeding. You should call your healthcare provider right away if you experience any of the following: Bleeding from an injury or your nose that does not stop. Unusual colored urine (red or dark brown) or unusual colored stools (red or black). Unusual bruising for unknown reasons. A serious fall or if you hit your head (even if there is no bleeding).  Some medicines may interact with Xarelto  and might increase your risk of bleeding while on Xarelto . To help avoid this, consult your healthcare provider or pharmacist prior to using any new prescription or non-prescription medications, including herbals, vitamins, non-steroidal anti-inflammatory drugs (NSAIDs) and supplements.  This website has more information on Xarelto : [email or web address] .com.

## 2024-06-08 ENCOUNTER — Encounter (HOSPITAL_COMMUNITY): Payer: Self-pay | Admitting: Orthopedic Surgery

## 2024-06-08 ENCOUNTER — Other Ambulatory Visit (HOSPITAL_COMMUNITY): Payer: Self-pay

## 2024-06-08 DIAGNOSIS — M1611 Unilateral primary osteoarthritis, right hip: Secondary | ICD-10-CM | POA: Diagnosis not present

## 2024-06-08 LAB — BASIC METABOLIC PANEL WITH GFR
Anion gap: 8 (ref 5–15)
BUN: 9 mg/dL (ref 6–20)
CO2: 29 mmol/L (ref 22–32)
Calcium: 9.4 mg/dL (ref 8.9–10.3)
Chloride: 102 mmol/L (ref 98–111)
Creatinine, Ser: 0.69 mg/dL (ref 0.44–1.00)
GFR, Estimated: >60 mL/min
Glucose, Bld: 201 mg/dL — ABNORMAL HIGH (ref 70–99)
Potassium: 3.9 mmol/L (ref 3.5–5.1)
Sodium: 140 mmol/L (ref 135–145)

## 2024-06-08 LAB — CBC
HCT: 39.5 % (ref 36.0–46.0)
Hemoglobin: 12.8 g/dL (ref 12.0–15.0)
MCH: 29.7 pg (ref 26.0–34.0)
MCHC: 32.4 g/dL (ref 30.0–36.0)
MCV: 91.6 fL (ref 80.0–100.0)
Platelets: 295 K/uL (ref 150–400)
RBC: 4.31 MIL/uL (ref 3.87–5.11)
RDW: 13 % (ref 11.5–15.5)
WBC: 16.3 K/uL — ABNORMAL HIGH (ref 4.0–10.5)
nRBC: 0 % (ref 0.0–0.2)

## 2024-06-08 MED ORDER — HYDROMORPHONE HCL 2 MG PO TABS
2.0000 mg | ORAL_TABLET | ORAL | 0 refills | Status: AC | PRN
  Filled 2024-06-08: qty 28, 5d supply, fill #0

## 2024-06-08 MED ORDER — METHOCARBAMOL 500 MG PO TABS
500.0000 mg | ORAL_TABLET | Freq: Four times a day (QID) | ORAL | 0 refills | Status: DC | PRN
  Filled 2024-06-08: qty 40, 10d supply, fill #0

## 2024-06-08 MED ORDER — ONDANSETRON HCL 4 MG PO TABS
4.0000 mg | ORAL_TABLET | Freq: Four times a day (QID) | ORAL | 0 refills | Status: AC | PRN
  Filled 2024-06-08: qty 20, 5d supply, fill #0

## 2024-06-08 MED ORDER — RIVAROXABAN 10 MG PO TABS
10.0000 mg | ORAL_TABLET | Freq: Every day | ORAL | 0 refills | Status: AC
  Filled 2024-06-08: qty 20, 20d supply, fill #0

## 2024-06-08 NOTE — Progress Notes (Signed)
 Physical Therapy Treatment Patient Details Name: Denise Ray MRN: [id number] DOB: 04/11/1971 Today's Date: 06/08/2024   History of Present Illness Pt is 54 yo female admitted on 06/07/24 for R anterior THA.  Pt with hx including but not limited to arthritis, anxiety, peripheral polyneuropathy, CHF, CAD, GERD, DM, OSA, COPD, PTSD, DDD, migraines, OA    PT Comments  Pt ambulated in hallway and performed LE exercsies.  Pt's questions answered within scope of practice, and pt provided with HEP handout.  Pt has ramp to enter home she will discharge and declined practicing steps.  Pt ready for d/c today.     If plan is discharge home, recommend the following: A little help with walking and/or transfers;A little help with bathing/dressing/bathroom;Assistance with cooking/housework;Help with stairs or ramp for entrance   Can travel by private vehicle        Equipment Recommendations  None recommended by PT    Recommendations for Other Services       Precautions / Restrictions Precautions Precautions: Fall Restrictions Weight Bearing Restrictions Per Provider Order: No RLE Weight Bearing Per Provider Order: Weight bearing as tolerated     Mobility  Bed Mobility               General bed mobility comments: pt in recliner    Transfers Overall transfer level: Needs assistance Equipment used: Rolling walker (2 wheels) Transfers: Sit to/from Stand Sit to Stand: Contact guard assist, Supervision           General transfer comment: verbal cues for UE and LE positioning for pain control    Ambulation/Gait Ambulation/Gait assistance: Contact guard assist Gait Distance (Feet): 120 Feet Assistive device: Rolling walker (2 wheels) Gait Pattern/deviations: Antalgic, Decreased stance time - right, Step-through pattern Gait velocity: decreased     General Gait Details: mostly forefoot contact only on R, verbal cues for weight bearing throughing RW for pain  control   Stairs             Wheelchair Mobility     Tilt Bed    Modified Rankin (Stroke Patients Only)       Balance                                            Communication Communication Communication: No apparent difficulties  Cognition Arousal: Alert Behavior During Therapy: WFL for tasks assessed/performed   PT - Cognitive impairments: No apparent impairments                         Following commands: Intact      Cueing    Exercises Total Joint Exercises Ankle Circles/Pumps: AROM, 10 reps, Both Quad Sets: AROM, 10 reps, Both Heel Slides: AAROM, Right, 10 reps Hip ABduction/ADduction: AROM, Right, 5 reps, Standing Long Arc Quad: AROM, Right, 10 reps, Seated Knee Flexion: AROM, Right, Standing, 10 reps Marching in Standing: AROM, Right, Standing, 10 reps Standing Hip Extension: AROM, Right, Standing, 10 reps    General Comments        Pertinent Vitals/Pain Pain Assessment Pain Assessment: 0-10 Pain Score: 9  Pain Location: R hip Pain Descriptors / Indicators: Aching, Sore Pain Intervention(s): Repositioned, Monitored during session, Patient requesting pain meds-RN notified, Ice applied    Home Living  Prior Function            PT Goals (current goals can now be found in the care plan section) Progress towards PT goals: Progressing toward goals    Frequency    7X/week      PT Plan      Co-evaluation              AM-PAC PT 6 Clicks Mobility   Outcome Measure  Help needed turning from your back to your side while in a flat bed without using bedrails?: A Little Help needed moving from lying on your back to sitting on the side of a flat bed without using bedrails?: A Little Help needed moving to and from a bed to a chair (including a wheelchair)?: A Little Help needed standing up from a chair using your arms (e.g., wheelchair or bedside chair)?: A Little Help  needed to walk in hospital room?: A Little Help needed climbing 3-5 steps with a railing? : A Little 6 Click Score: 18    End of Session Equipment Utilized During Treatment: Gait belt Activity Tolerance: Patient tolerated treatment well Patient left: with chair alarm set;in chair;with call bell/phone within reach Nurse Communication: Mobility status PT Visit Diagnosis: Other abnormalities of gait and mobility (R26.89);Muscle weakness (generalized) (M62.81)     Time: 8982-8958 PT Time Calculation (min) (ACUTE ONLY): 24 min  Charges:    $Gait Training: 8-22 mins $Therapeutic Exercise: 8-22 mins PT General Charges $$ ACUTE PT VISIT: 1 Visit                     Tari KLEIN, DPT Physical Therapist Acute Rehabilitation Services Office: [phone]    [name] 06/08/2024, 11:57 AM

## 2024-06-08 NOTE — Progress Notes (Signed)
" °   06/07/24 0045  BiPAP/CPAP/SIPAP  BiPAP/CPAP/SIPAP Pt Type Adult  BiPAP/CPAP/SIPAP Resmed  Mask Type Full face mask  Dentures removed? Not applicable  Patient Home Machine Yes  Safety Check Completed by RT for Home Unit Yes, no issues noted  Patient Home Mask Yes  Patient Home Tubing Yes  Auto Titrate  (home settings)  Device Plugged into RED Power Outlet Yes    "

## 2024-06-08 NOTE — Plan of Care (Signed)
" °  Problem: Bowel/Gastric: Goal: Gastrointestinal status for postoperative course will improve 06/08/2024 0849 by Alaina Dozier PARAS, RN Outcome: Adequate for Discharge 06/08/2024 [phone] by Alaina Dozier PARAS, RN Outcome: Progressing   Problem: Cardiac: Goal: Ability to maintain an adequate cardiac output 06/08/2024 0849 by Alaina Dozier PARAS, RN Outcome: Adequate for Discharge 06/08/2024 0849 by Alaina Dozier PARAS, RN Outcome: Progressing Goal: Will show no evidence of cardiac arrhythmias 06/08/2024 0849 by Alaina Dozier PARAS, RN Outcome: Adequate for Discharge 06/08/2024 0849 by Alaina Dozier PARAS, RN Outcome: Progressing   Problem: Nutritional: Goal: Will attain and maintain optimal nutritional status 06/08/2024 0849 by Alaina Dozier PARAS, RN Outcome: Adequate for Discharge 06/08/2024 0849 by Alaina Dozier PARAS, RN Outcome: Progressing   Problem: Neurological: Goal: Will regain or maintain usual level of consciousness Outcome: Adequate for Discharge   Problem: Clinical Measurements: Goal: Ability to maintain clinical measurements within normal limits Outcome: Adequate for Discharge Goal: Postoperative complications will be avoided or minimized Outcome: Adequate for Discharge   Problem: Respiratory: Goal: Will regain and/or maintain adequate ventilation Outcome: Adequate for Discharge Goal: Respiratory status will improve Outcome: Adequate for Discharge   Problem: Skin Integrity: Goal: Demonstrates signs of wound healing without infection Outcome: Adequate for Discharge   Problem: Urinary Elimination: Goal: Will remain free from infection Outcome: Adequate for Discharge Goal: Ability to achieve and maintain adequate urine output Outcome: Adequate for Discharge   Problem: Education: Goal: Knowledge of General Education information will improve Description: Including pain rating scale, medication(s)/side effects and non-pharmacologic comfort  measures Outcome: Adequate for Discharge   Problem: Health Behavior/Discharge Planning: Goal: Ability to manage health-related needs will improve Outcome: Adequate for Discharge   Problem: Clinical Measurements: Goal: Ability to maintain clinical measurements within normal limits will improve Outcome: Adequate for Discharge Goal: Will remain free from infection Outcome: Adequate for Discharge Goal: Diagnostic test results will improve Outcome: Adequate for Discharge Goal: Respiratory complications will improve Outcome: Adequate for Discharge Goal: Cardiovascular complication will be avoided Outcome: Adequate for Discharge   Problem: Activity: Goal: Risk for activity intolerance will decrease Outcome: Adequate for Discharge   Problem: Nutrition: Goal: Adequate nutrition will be maintained Outcome: Adequate for Discharge   Problem: Coping: Goal: Level of anxiety will decrease Outcome: Adequate for Discharge   Problem: Elimination: Goal: Will not experience complications related to bowel motility Outcome: Adequate for Discharge Goal: Will not experience complications related to urinary retention Outcome: Adequate for Discharge   Problem: Pain Managment: Goal: General experience of comfort will improve and/or be controlled Outcome: Adequate for Discharge   Problem: Safety: Goal: Ability to remain free from injury will improve Outcome: Adequate for Discharge   Problem: Skin Integrity: Goal: Risk for impaired skin integrity will decrease Outcome: Adequate for Discharge   Problem: Education: Goal: Knowledge of the prescribed therapeutic regimen will improve Outcome: Adequate for Discharge Goal: Understanding of discharge needs will improve Outcome: Adequate for Discharge Goal: Individualized Educational Video(s) Outcome: Adequate for Discharge   Problem: Activity: Goal: Ability to avoid complications of mobility impairment will improve Outcome: Adequate for  Discharge Goal: Ability to tolerate increased activity will improve Outcome: Adequate for Discharge   Problem: Clinical Measurements: Goal: Postoperative complications will be avoided or minimized Outcome: Adequate for Discharge   Problem: Pain Management: Goal: Pain level will decrease with appropriate interventions Outcome: Adequate for Discharge   Problem: Skin Integrity: Goal: Will show signs of wound healing Outcome: Adequate for Discharge   "

## 2024-06-08 NOTE — Discharge Summary (Signed)
 Patient ID: Denise Ray MRN: [id number] DOB/AGE: 52-Mar-1972 52 y.o.  Admit date: 06/07/2024 Discharge date: 06/08/2024  Admission Diagnoses:  Principal Problem:   OA (osteoarthritis) of hip Active Problems:   Primary osteoarthritis of left hip   Discharge Diagnoses:  Same  Past Medical History:  Diagnosis Date   Chronic congestive heart failure, unspecified heart failure type (HCC) 07/07/2022   per pcp- no per pt, no cards   Chronic pain    followed by pain clinic   Complication of anesthesia    post op urinary retention   COPD with asthma (HCC)    followed by pcp (previous seen by [facility]) hx exacerbation's  --- ( 10-01-2022 last asthma exacerbation ED visit 03-10-2022 in epic brought by EMS, ARF w/ hypoxmia Bipap,  left AMA last used rescue inhaler a week ago)   DDD (degenerative disc disease), lumbosacral    DOE (dyspnea on exertion)    per pt going flight of stairs but normal daily activites no sob   Family history of adverse reaction to anesthesia    mother -- ponv ;  per pt in 04/ 2021 father had cardiac arrest and Atrial fib intraoperative for circumcision (this was in [place] ) put on venitator for two days then had nuclear stress test per pt showed disease but no cath done, still  has atrial fib;  she also stated prior to this he known cardiac disease with no intervention   GAD (generalized anxiety disorder)    GERD (gastroesophageal reflux disease)    Hemorrhoids    History of acute respiratory failure    06/ 2015  and 12/ 2016  CAP and Asthma exacerbation--  vented both times (ARDS)   History of anal dysplasia    AIN 2/ 3    s/p  surgical excision's    (07/ 2021   s/p excision anal polyp ,  SCCIS)   History of cervical dysplasia    CIN 2 in 2000/   History of chest pain    per pt had cardiology evaluation for chest pain (but pt stated has not had any chest pain or cardiac symtpoms since) referred by pcp  w/ dr delford note in epic 05-22-2019;  pt had  nuclear stress test the showed normal perfusion no ischemia and ef >65% ,  also had echo same day showed normal w/ ef 65%   History of condyloma acuminatum    anal condyloma   s/p excision's   History of pneumonia    hx Required ventilatory support   History of respiratory failure 10/31/2013   History of TIA (transient ischemic attack)    Caused by medication reaction with combination of wellbutrin and estrogen   HSV-2 infection    Hypothyroidism    followed by pcp   MDD (major depressive disorder)    Migraines    improved per pt   OA (osteoarthritis)    bilateral hips w/ pain   OSA on CPAP    10-05-2022 pt stated usually uses nightly however not every night due to seasonall allergies   Peripheral neuropathy    PONV (postoperative nausea and vomiting)    PTSD (post-traumatic stress disorder)    TIA (transient ischemic attack)    Type 2 diabetes mellitus (HCC)    followed by pcp  (10-05-2022 pt stated does not check blood sugar)   Wears glasses     Surgeries: Procedures: ARTHROPLASTY, HIP, TOTAL, ANTERIOR APPROACH on 06/07/2024   Consultants:   Discharged Condition: Improved  Hospital Course: ROAN SAWCHUK is an 54 y.o. female who was admitted 06/07/2024 for operative treatment ofOA (osteoarthritis) of hip. Patient has severe unremitting pain that affects sleep, daily activities, and work/hobbies. After pre-op clearance the patient was taken to the operating room on 06/07/2024 and underwent  Procedures: ARTHROPLASTY, HIP, TOTAL, ANTERIOR APPROACH.    Patient was given perioperative antibiotics:  Anti-infectives (From admission, onward)    Start     Dose/Rate Route Frequency Ordered Stop   06/07/24 1700  ceFAZolin  (ANCEF ) IVPB 2g/100 mL premix        2 g 200 mL/hr over 30 Minutes Intravenous Every 6 hours 06/07/24 1441 06/08/24 0759   06/07/24 0845  ceFAZolin  (ANCEF ) IVPB 2g/100 mL premix        2 g 200 mL/hr over 30 Minutes Intravenous On call to O.R. 06/07/24 9167  06/07/24 1055        Patient was given sequential compression devices, early ambulation, and chemoprophylaxis to prevent DVT.  Patient benefited maximally from hospital stay and there were no complications.    Recent vital signs: Patient Vitals for the past 24 hrs:  BP Temp Temp src Pulse Resp SpO2  06/08/24 0457 100/61 97.6 F (36.4 C) -- 61 18 96 %  06/08/24 0141 115/62 97.6 F (36.4 C) -- 65 18 94 %  06/07/24 2326 (!) 113/58 97.9 F (36.6 C) -- 74 18 95 %  06/07/24 1824 116/64 (!) 97.5 F (36.4 C) Oral 63 18 95 %     Recent laboratory studies:  Recent Labs    06/08/24 0322  WBC 16.3*  HGB 12.8  HCT 39.5  PLT 295  NA 140  K 3.9  CL 102  CO2 29  BUN 9  CREATININE 0.69  GLUCOSE 201*  CALCIUM  9.4     Discharge Medications:   Allergies as of 06/08/2024       Reactions   Tamiflu [oseltamivir Phosphate] Other (See Comments), Cough   BRONCHOCONSTRICTION   Estrogens Other (See Comments)   SYMPTOMS OF A STROKE   Wellbutrin [bupropion] Other (See Comments)   CAUSED TIA WHEN TAKEN WITH ESTROGEN   Ibuprofen  Other (See Comments)   Avoids due to nosebleeds   Morphine  Other (See Comments)   Substance with morphinan structure and opioid receptor agonist mechanism of action (substance)   Other    Bitrex, used in FIT testing    Morphine  And Codeine Other (See Comments)   DOES NOT RELIEVE PAIN        Medication List     STOP taking these medications    Vitamin D  (Ergocalciferol ) 1.25 MG (50000 UNIT) Caps capsule Commonly known as: DRISDOL        TAKE these medications    albuterol  (2.5 MG/3ML) 0.083% nebulizer solution Commonly known as: PROVENTIL  Inhale 2.5 mg into the lungs 2 (two) times daily as needed (asthma).   albuterol  108 (90 Base) MCG/ACT inhaler Commonly known as: VENTOLIN  HFA Inhale 2 puffs into the lungs every 6 (six) hours as needed. wheezing   ALPRAZolam  1 MG tablet Commonly known as: XANAX  Take 1 tablet (1 mg total) by mouth 2 (two)  times daily as needed. What changed:  when to take this reasons to take this   Betasept  Surgical Scrub 4 % external liquid Generic drug: chlorhexidine  Apply 15 mLs (1 Application total) topically as directed for 30 doses. Use as directed daily for 5 days every other week for 6 weeks.   famotidine  20 MG tablet Commonly known as: PEPCID   Take 20 mg by mouth at bedtime as needed for heartburn or indigestion.   gabapentin  100 MG capsule Commonly known as: NEURONTIN  Take 3 capsules (300 mg total) by mouth 4 (four) times daily.   HYDROcodone -acetaminophen  10-325 MG tablet Commonly known as: NORCO Take 1 tablet by mouth every 6 hours as needed.   HYDROmorphone  2 MG tablet Commonly known as: Dilaudid  Take 1 tablet (2 mg total) by mouth every 4 (four) hours as needed for up to 7 days (for severe breakthrough pain not controlled by chronic Norco).   levothyroxine  50 MCG tablet Commonly known as: SYNTHROID  Take 1 tablet (50 mcg total) by mouth daily. Take on an empty stomach 30 minutes prior to eating or other medications.   methocarbamol  500 MG tablet Commonly known as: ROBAXIN  Take 1 tablet (500 mg total) by mouth every 6 (six) hours as needed for muscle spasms.   Mounjaro  10 MG/0.5ML Pen Generic drug: tirzepatide  Inject 10 mg into the skin once a week.   mupirocin  ointment 2 % Commonly known as: BACTROBAN  Place 1 Application into the nose 2 (two) times daily. Use as directed 2 times daily for 5 days every other week for 6 weeks.   nystatin  cream Commonly known as: MYCOSTATIN  Apply 1 Application topically 2 (two) times daily as needed for dry skin.   nystatin  powder Apply 1 Application topically 2 (two) times daily as needed (irritation).   ondansetron  4 MG tablet Commonly known as: ZOFRAN  Take 1 tablet (4 mg total) by mouth every 6 (six) hours as needed for nausea.   pantoprazole  40 MG tablet Commonly known as: PROTONIX  Take 1 tablet (40 mg total) by mouth daily.    rosuvastatin  20 MG tablet Commonly known as: CRESTOR  Take 1 tablet (20 mg total) by mouth at bedtime.   varenicline  1 MG tablet Commonly known as: CHANTIX  Take 1 tablet (1 mg total) by mouth 2 (two) times daily.   Xarelto  10 MG Tabs tablet Generic drug: rivaroxaban  Take 1 tablet (10 mg total) by mouth daily with breakfast for 3 weeks, then take 81mg  Aspirin  once daily for 3 weeks.               Discharge Care Instructions  (From admission, onward)           Start     Ordered   06/08/24 0000  Weight bearing as tolerated        06/08/24 0754   06/08/24 0000  Change dressing       Comments: You have an adhesive waterproof bandage over the incision. Leave this in place until your first follow-up appointment. Once you remove this you will not need to place another bandage.   06/08/24 0754            Diagnostic Studies: DG Pelvis Portable Result Date: 06/07/2024 CLINICAL DATA:  Status post right hip replacement. EXAM: PORTABLE PELVIS 1-2 VIEWS COMPARISON:  None Available. FINDINGS: Right hip arthroplasty in expected alignment. No periprosthetic lucency or fracture. Recent postsurgical change includes air and edema in the soft tissues. IMPRESSION: Right hip arthroplasty without immediate postoperative complication. Electronically Signed   By: Andrea Gasman M.D.   On: 06/07/2024 13:34   DG HIP UNILAT WITH PELVIS 1V RIGHT Result Date: 06/07/2024 CLINICAL DATA:  Elective surgery EXAM: DG HIP (WITH OR WITHOUT PELVIS) 1V RIGHT COMPARISON:  Preoperative imaging FINDINGS: Ten fluoroscopic spot views of the pelvis and right hip obtained in the operating room. Sequential images during hip arthroplasty. Fluoroscopy time 8  seconds. Dose 0.56 mGy. IMPRESSION: Intraoperative fluoroscopy during right hip arthroplasty. Electronically Signed   By: Andrea Gasman M.D.   On: 06/07/2024 13:33   DG C-Arm 1-60 Min-No Report Result Date: 06/07/2024 Fluoroscopy was utilized by the requesting  physician.  No radiographic interpretation.   DG C-Arm 1-60 Min-No Report Result Date: 06/07/2024 Fluoroscopy was utilized by the requesting physician.  No radiographic interpretation.    Disposition: Discharge disposition: 01-Home or Self Care       Discharge Instructions     Call MD / Call 911   Complete by: As directed    If you experience chest pain or shortness of breath, CALL 911 and be transported to the hospital emergency room.  If you develope a fever above 101 F, pus (white drainage) or increased drainage or redness at the wound, or calf pain, call your surgeon's office.   Change dressing   Complete by: As directed    You have an adhesive waterproof bandage over the incision. Leave this in place until your first follow-up appointment. Once you remove this you will not need to place another bandage.   Constipation Prevention   Complete by: As directed    Drink plenty of fluids.  Prune juice may be helpful.  You may use a stool softener, such as Colace (over the counter) 100 mg twice a day.  Use MiraLax  (over the counter) for constipation as needed.   Diet - low sodium heart healthy   Complete by: As directed    Do not sit on low chairs, stoools or toilet seats, as it may be difficult to get up from low surfaces   Complete by: As directed    Driving restrictions   Complete by: As directed    No driving for two weeks   Post-operative opioid taper instructions:   Complete by: As directed    POST-OPERATIVE OPIOID TAPER INSTRUCTIONS: It is important to wean off of your opioid medication as soon as possible. If you do not need pain medication after your surgery it is ok to stop day one. Opioids include: Codeine, Hydrocodone (Norco, Vicodin), Oxycodone (Percocet, oxycontin ) and hydromorphone  amongst others.  Long term and even short term use of opiods can cause: Increased pain response Dependence Constipation Depression Respiratory depression And more.  Withdrawal symptoms  can include Flu like symptoms Nausea, vomiting And more Techniques to manage these symptoms Hydrate well Eat regular healthy meals Stay active Use relaxation techniques(deep breathing, meditating, yoga) Do Not substitute Alcohol to help with tapering If you have been on opioids for less than two weeks and do not have pain than it is ok to stop all together.  Plan to wean off of opioids This plan should start within one week post op of your joint replacement. Maintain the same interval or time between taking each dose and first decrease the dose.  Cut the total daily intake of opioids by one tablet each day Next start to increase the time between doses. The last dose that should be eliminated is the evening dose.      TED hose   Complete by: As directed    Use stockings (TED hose) for three weeks on both leg(s).  You may remove them at night for sleeping.   Weight bearing as tolerated   Complete by: As directed         Follow-up Information     Aluisio, Dempsey, MD. Schedule an appointment as soon as possible for a visit in 2 week(s).  Specialty: Orthopedic Surgery Contact information: [address] [place] [address] [phone]                  Signed: Waddell DELENA Sor 06/08/2024, 4:45 PM

## 2024-06-08 NOTE — Plan of Care (Signed)
" °  Problem: Bowel/Gastric: Goal: Gastrointestinal status for postoperative course will improve Outcome: Progressing   Problem: Cardiac: Goal: Ability to maintain an adequate cardiac output Outcome: Progressing Goal: Will show no evidence of cardiac arrhythmias Outcome: Progressing   Problem: Nutritional: Goal: Will attain and maintain optimal nutritional status Outcome: Progressing   "

## 2024-06-08 NOTE — Progress Notes (Signed)
 "  Subjective: 1 Day Post-Op Procedures (LRB): Denise Ray, HIP, TOTAL, ANTERIOR APPROACH (Right) Patient reports pain as moderate.   Patient seen in rounds by Dr. Melodi. Patient is well, and has had no acute complaints or problems. Denies chest pain or SOB. No issues overnight. Foley catheter removed this AM. We will continue therapy today, ambulated 25' yesterday.   Objective: Vital signs in last 24 hours: Temp:  [96.3 F (35.7 C)-98.3 F (36.8 C)] 97.6 F (36.4 C) (01/22 0457) Pulse Rate:  [61-74] 61 (01/22 0457) Resp:  [11-19] 18 (01/22 0457) BP: (99-137)/(49-115) 100/61 (01/22 0457) SpO2:  [91 %-100 %] 96 % (01/22 0457) Weight:  [93.4 kg] 93.4 kg (01/21 1521)  Intake/Output from previous day:  Intake/Output Summary (Last 24 hours) at 06/08/2024 0743 Last data filed at 06/08/2024 0600 Gross per 24 hour  Intake 3442.1 ml  Output 1950 ml  Net 1492.1 ml     Intake/Output this shift: No intake/output data recorded.  Labs: Recent Labs    06/08/24 0322  HGB 12.8   Recent Labs    06/08/24 0322  WBC 16.3*  RBC 4.31  HCT 39.5  PLT 295   Recent Labs    06/08/24 0322  NA 140  K 3.9  CL 102  CO2 29  BUN 9  CREATININE 0.69  GLUCOSE 201*  CALCIUM  9.4   No results for input(s): LABPT, INR in the last 72 hours.  Exam: General - Patient is Alert and Oriented Extremity - Neurologically intact Neurovascular intact Sensation intact distally Dorsiflexion/Plantar flexion intact Dressing - dressing C/D/I Motor Function - intact, moving foot and toes well on exam.   Past Medical History:  Diagnosis Date   Chronic congestive heart failure, unspecified heart failure type (HCC) 07/07/2022   per pcp- no per pt, no cards   Chronic pain    followed by pain clinic   Complication of anesthesia    post op urinary retention   COPD with asthma (HCC)    followed by pcp (previous seen by [facility]) hx exacerbation's  --- ( 10-01-2022 last asthma exacerbation ED  visit 03-10-2022 in epic brought by EMS, ARF w/ hypoxmia Bipap,  left AMA last used rescue inhaler a week ago)   DDD (degenerative disc disease), lumbosacral    DOE (dyspnea on exertion)    per pt going flight of stairs but normal daily activites no sob   Family history of adverse reaction to anesthesia    mother -- ponv ;  per pt in 04/ 2021 father had cardiac arrest and Atrial fib intraoperative for circumcision (this was in [place] ) put on venitator for two days then had nuclear stress test per pt showed disease but no cath done, still  has atrial fib;  she also stated prior to this he known cardiac disease with no intervention   GAD (generalized anxiety disorder)    GERD (gastroesophageal reflux disease)    Hemorrhoids    History of acute respiratory failure    06/ 2015  and 12/ 2016  CAP and Asthma exacerbation--  vented both times (ARDS)   History of anal dysplasia    AIN 2/ 3    s/p  surgical excision's    (07/ 2021   s/p excision anal polyp ,  SCCIS)   History of cervical dysplasia    CIN 2 in 2000/   History of chest pain    per pt had cardiology evaluation for chest pain (but pt stated has not had any  chest pain or cardiac symtpoms since) referred by pcp  w/ dr delford note in epic 05-22-2019;  pt had nuclear stress test the showed normal perfusion no ischemia and ef >65% ,  also had echo same day showed normal w/ ef 65%   History of condyloma acuminatum    anal condyloma   s/p excision's   History of pneumonia    hx Required ventilatory support   History of respiratory failure 10/31/2013   History of TIA (transient ischemic attack)    Caused by medication reaction with combination of wellbutrin and estrogen   HSV-2 infection    Hypothyroidism    followed by pcp   MDD (major depressive disorder)    Migraines    improved per pt   OA (osteoarthritis)    bilateral hips w/ pain   OSA on CPAP    10-05-2022 pt stated usually uses nightly however not every night due to  seasonall allergies   Peripheral neuropathy    PONV (postoperative nausea and vomiting)    PTSD (post-traumatic stress disorder)    TIA (transient ischemic attack)    Type 2 diabetes mellitus (HCC)    followed by pcp  (10-05-2022 pt stated does not check blood sugar)   Wears glasses     Assessment/Plan: 1 Day Post-Op Procedures (LRB): Denise Ray, HIP, TOTAL, ANTERIOR APPROACH (Right) Principal Problem:   OA (osteoarthritis) of hip Active Problems:   Primary osteoarthritis of left hip  Estimated body mass index is 35.36 kg/m as calculated from the following:   Height as of this encounter: 5' 4 (1.626 m).   Weight as of this encounter: 93.4 kg. Advance diet Up with therapy D/C IV fluids  DVT Prophylaxis - Xarelto  Weight bearing as tolerated. Continue therapy.  Plan is to go Home after hospital stay. Plan for discharge with HEP later today if progresses with therapy and meeting goals. Follow-up in the office in 2 weeks.  The PDMP database was reviewed today prior to any opioid medications being prescribed to this patient.  Waddell Sor, PA-C Orthopedic Surgery [phone] 06/08/2024, 7:43 AM  "

## 2024-06-08 NOTE — TOC Transition Note (Signed)
 Transition of Care [facility]) - Discharge Note   Patient Details  Name: Denise Ray MRN: [id number] Date of Birth: November 15, 1970  [facility] CM/SW Contact:  Heather DELENA Saltness, LCSW Phone Number: 06/08/2024, 9:31 AM   Clinical Narrative:    Pt discharging home today after R THA. Plan is for HEP upon discharge. No DME needs. No further TOC needs at this time.   Final next level of care: Home/Self Care Barriers to Discharge: Barriers Resolved   Patient Goals and CMS Choice Patient states their goals for this hospitalization and ongoing recovery are:: To return home   Choice offered to / list presented to : NA [facility] ownership interest in [facility].provided to:: Parent NA    Discharge Placement  Home              Patient to be transferred to facility by: Family Name of family member notified: Patient Patient and family notified of of transfer: 06/08/24  Discharge Plan and Services Additional resources added to the After Visit Summary for  Follow Up                DME Arranged: N/A DME Agency: NA       HH Arranged: NA HH Agency: NA        Social Drivers of Health (SDOH) Interventions SDOH Screenings   Food Insecurity: No Food Insecurity (06/07/2024)  Housing: Low Risk (06/07/2024)  Transportation Needs: No Transportation Needs (06/07/2024)  Utilities: Not At Risk (06/07/2024)  Depression (PHQ2-9): Low Risk (03/03/2024)  Financial Resource Strain: Low Risk (10/22/2022)  Social Connections: Socially Isolated (10/22/2022)  Stress: Stress Concern Present (10/22/2022)  Tobacco Use: High Risk (06/07/2024)     Readmission Risk Interventions     No data to display           Signed: Heather Saltness, MSW, LCSW Clinical Social Worker Inpatient Care Management 06/08/2024 9:32 AM

## 2024-06-08 NOTE — Progress Notes (Signed)
 Discharge instructions given to patient and family questions asked and answered.

## 2024-06-08 NOTE — Progress Notes (Signed)
 Discharge medications delivered to patient at the bedside in a secure bag.

## 2024-06-13 ENCOUNTER — Other Ambulatory Visit: Payer: Self-pay

## 2024-06-13 ENCOUNTER — Other Ambulatory Visit (HOSPITAL_COMMUNITY): Payer: Self-pay

## 2024-06-14 ENCOUNTER — Other Ambulatory Visit (HOSPITAL_COMMUNITY): Payer: Self-pay

## 2024-06-14 ENCOUNTER — Other Ambulatory Visit: Payer: Self-pay

## 2024-06-14 MED ORDER — NYSTATIN 100000 UNIT/ML MT SUSP
5.0000 mL | Freq: Four times a day (QID) | OROMUCOSAL | 0 refills | Status: AC | PRN
  Filled 2024-06-14: qty 327, 17d supply, fill #0

## 2024-06-20 ENCOUNTER — Other Ambulatory Visit (HOSPITAL_COMMUNITY): Payer: Self-pay

## 2024-06-20 MED ORDER — METHOCARBAMOL 500 MG PO TABS
500.0000 mg | ORAL_TABLET | Freq: Three times a day (TID) | ORAL | 0 refills | Status: AC | PRN
  Filled 2024-06-20: qty 40, 14d supply, fill #0

## 2024-06-21 ENCOUNTER — Other Ambulatory Visit (HOSPITAL_COMMUNITY): Payer: Self-pay

## 2024-09-07 ENCOUNTER — Ambulatory Visit: Payer: Self-pay | Admitting: Nurse Practitioner

## 2024-11-09 ENCOUNTER — Ambulatory Visit (HOSPITAL_BASED_OUTPATIENT_CLINIC_OR_DEPARTMENT_OTHER): Admitting: Obstetrics & Gynecology

## 2025-03-23 ENCOUNTER — Encounter: Payer: Self-pay | Admitting: Nurse Practitioner
